# Patient Record
Sex: Male | Born: 1935 | Race: White | Hispanic: No | Marital: Married | Smoking: Former smoker
Health system: Southern US, Community
[De-identification: ages and names within clinical notes are randomized; demographics above are authoritative.]

## PROBLEM LIST (undated history)

## (undated) DIAGNOSIS — G459 Transient cerebral ischemic attack, unspecified: Secondary | ICD-10-CM

## (undated) DIAGNOSIS — E785 Hyperlipidemia, unspecified: Secondary | ICD-10-CM

## (undated) DIAGNOSIS — Z7409 Other reduced mobility: Secondary | ICD-10-CM

## (undated) DIAGNOSIS — I472 Ventricular tachycardia, unspecified: Secondary | ICD-10-CM

## (undated) DIAGNOSIS — T462X5A Adverse effect of other antidysrhythmic drugs, initial encounter: Secondary | ICD-10-CM

## (undated) DIAGNOSIS — Z789 Other specified health status: Secondary | ICD-10-CM

## (undated) DIAGNOSIS — I4821 Permanent atrial fibrillation: Secondary | ICD-10-CM

## (undated) DIAGNOSIS — A4902 Methicillin resistant Staphylococcus aureus infection, unspecified site: Secondary | ICD-10-CM

## (undated) DIAGNOSIS — S065X9A Traumatic subdural hemorrhage with loss of consciousness of unspecified duration, initial encounter: Secondary | ICD-10-CM

## (undated) DIAGNOSIS — N319 Neuromuscular dysfunction of bladder, unspecified: Secondary | ICD-10-CM

## (undated) DIAGNOSIS — J189 Pneumonia, unspecified organism: Secondary | ICD-10-CM

## (undated) DIAGNOSIS — J984 Other disorders of lung: Secondary | ICD-10-CM

## (undated) DIAGNOSIS — I259 Chronic ischemic heart disease, unspecified: Secondary | ICD-10-CM

## (undated) DIAGNOSIS — I509 Heart failure, unspecified: Secondary | ICD-10-CM

## (undated) DIAGNOSIS — R7303 Prediabetes: Secondary | ICD-10-CM

## (undated) DIAGNOSIS — R339 Retention of urine, unspecified: Secondary | ICD-10-CM

## (undated) DIAGNOSIS — D649 Anemia, unspecified: Secondary | ICD-10-CM

## (undated) DIAGNOSIS — J449 Chronic obstructive pulmonary disease, unspecified: Secondary | ICD-10-CM

## (undated) DIAGNOSIS — I739 Peripheral vascular disease, unspecified: Secondary | ICD-10-CM

## (undated) DIAGNOSIS — I2510 Atherosclerotic heart disease of native coronary artery without angina pectoris: Secondary | ICD-10-CM

## (undated) DIAGNOSIS — I10 Essential (primary) hypertension: Secondary | ICD-10-CM

## (undated) DIAGNOSIS — K592 Neurogenic bowel, not elsewhere classified: Secondary | ICD-10-CM

## (undated) DIAGNOSIS — C189 Malignant neoplasm of colon, unspecified: Secondary | ICD-10-CM

## (undated) HISTORY — PX: CHOLECYSTECTOMY: SHX55

## (undated) HISTORY — PX: LUMBAR DISC SURGERY: SHX700

## (undated) HISTORY — PX: ILIAC ARTERY STENT: SHX1786

## (undated) HISTORY — DX: Heart failure, unspecified: I50.9

## (undated) HISTORY — DX: Atherosclerotic heart disease of native coronary artery without angina pectoris: I25.10

## (undated) HISTORY — DX: Hyperlipidemia, unspecified: E78.5

## (undated) HISTORY — DX: Ventricular tachycardia: I47.2

## (undated) HISTORY — DX: Essential (primary) hypertension: I10

## (undated) HISTORY — DX: Chronic ischemic heart disease, unspecified: I25.9

## (undated) HISTORY — PX: COLONOSCOPY: SHX174

## (undated) HISTORY — DX: Chronic obstructive pulmonary disease, unspecified: J44.9

## (undated) HISTORY — PX: POSTERIOR LUMBAR FUSION: SHX6036

## (undated) HISTORY — DX: Ventricular tachycardia, unspecified: I47.20

## (undated) HISTORY — PX: CATARACT EXTRACTION W/ INTRAOCULAR LENS  IMPLANT, BILATERAL: SHX1307

## (undated) HISTORY — DX: Peripheral vascular disease, unspecified: I73.9

## (undated) HISTORY — DX: Malignant neoplasm of colon, unspecified: C18.9

## (undated) HISTORY — PX: POLYPECTOMY: SHX149

---

## 1979-03-04 DIAGNOSIS — G459 Transient cerebral ischemic attack, unspecified: Secondary | ICD-10-CM

## 1979-03-04 HISTORY — DX: Transient cerebral ischemic attack, unspecified: G45.9

## 1996-07-03 HISTORY — PX: CORONARY ARTERY BYPASS GRAFT: SHX141

## 1998-10-12 ENCOUNTER — Other Ambulatory Visit: Admission: RE | Admit: 1998-10-12 | Discharge: 1998-10-12 | Payer: Self-pay | Admitting: Obstetrics and Gynecology

## 2000-01-30 ENCOUNTER — Inpatient Hospital Stay (HOSPITAL_COMMUNITY): Admission: EM | Admit: 2000-01-30 | Discharge: 2000-02-01 | Payer: Self-pay | Admitting: Emergency Medicine

## 2000-01-30 ENCOUNTER — Encounter: Payer: Self-pay | Admitting: Emergency Medicine

## 2000-01-31 ENCOUNTER — Encounter: Payer: Self-pay | Admitting: Interventional Cardiology

## 2004-01-07 ENCOUNTER — Encounter (INDEPENDENT_AMBULATORY_CARE_PROVIDER_SITE_OTHER): Payer: Self-pay | Admitting: *Deleted

## 2004-01-07 ENCOUNTER — Ambulatory Visit (HOSPITAL_COMMUNITY): Admission: RE | Admit: 2004-01-07 | Discharge: 2004-01-07 | Payer: Self-pay | Admitting: Gastroenterology

## 2004-01-28 ENCOUNTER — Encounter: Payer: Self-pay | Admitting: Pulmonary Disease

## 2004-01-28 ENCOUNTER — Ambulatory Visit (HOSPITAL_COMMUNITY): Admission: RE | Admit: 2004-01-28 | Discharge: 2004-01-28 | Payer: Self-pay | Admitting: Interventional Cardiology

## 2004-03-03 HISTORY — PX: COLON SURGERY: SHX602

## 2004-03-08 ENCOUNTER — Inpatient Hospital Stay (HOSPITAL_COMMUNITY): Admission: RE | Admit: 2004-03-08 | Discharge: 2004-03-17 | Payer: Self-pay | Admitting: Surgery

## 2004-03-09 ENCOUNTER — Encounter (INDEPENDENT_AMBULATORY_CARE_PROVIDER_SITE_OTHER): Payer: Self-pay | Admitting: Specialist

## 2004-04-28 ENCOUNTER — Ambulatory Visit (HOSPITAL_COMMUNITY): Admission: RE | Admit: 2004-04-28 | Discharge: 2004-04-28 | Payer: Self-pay | Admitting: Hematology and Oncology

## 2004-06-16 ENCOUNTER — Ambulatory Visit: Payer: Self-pay | Admitting: Pulmonary Disease

## 2004-07-18 ENCOUNTER — Ambulatory Visit: Payer: Self-pay | Admitting: Pulmonary Disease

## 2004-07-20 ENCOUNTER — Ambulatory Visit: Payer: Self-pay | Admitting: Pulmonary Disease

## 2004-07-20 ENCOUNTER — Inpatient Hospital Stay (HOSPITAL_COMMUNITY): Admission: EM | Admit: 2004-07-20 | Discharge: 2004-07-22 | Payer: Self-pay | Admitting: Emergency Medicine

## 2004-07-26 ENCOUNTER — Ambulatory Visit: Payer: Self-pay | Admitting: Hematology and Oncology

## 2004-08-02 ENCOUNTER — Ambulatory Visit: Payer: Self-pay | Admitting: Pulmonary Disease

## 2004-08-22 ENCOUNTER — Ambulatory Visit: Payer: Self-pay | Admitting: Critical Care Medicine

## 2004-09-11 ENCOUNTER — Inpatient Hospital Stay (HOSPITAL_COMMUNITY): Admission: EM | Admit: 2004-09-11 | Discharge: 2004-09-15 | Payer: Self-pay | Admitting: Emergency Medicine

## 2004-10-04 ENCOUNTER — Ambulatory Visit: Payer: Self-pay | Admitting: Pulmonary Disease

## 2004-10-20 ENCOUNTER — Inpatient Hospital Stay (HOSPITAL_COMMUNITY): Admission: AD | Admit: 2004-10-20 | Discharge: 2004-10-20 | Payer: Self-pay | Admitting: *Deleted

## 2004-11-03 ENCOUNTER — Ambulatory Visit: Payer: Self-pay | Admitting: Hematology and Oncology

## 2005-01-02 ENCOUNTER — Ambulatory Visit: Payer: Self-pay | Admitting: Critical Care Medicine

## 2005-01-04 ENCOUNTER — Ambulatory Visit: Payer: Self-pay | Admitting: Critical Care Medicine

## 2005-01-11 ENCOUNTER — Ambulatory Visit: Payer: Self-pay | Admitting: Pulmonary Disease

## 2005-01-13 ENCOUNTER — Ambulatory Visit: Payer: Self-pay | Admitting: Cardiology

## 2005-02-06 ENCOUNTER — Ambulatory Visit: Payer: Self-pay | Admitting: Pulmonary Disease

## 2005-02-06 ENCOUNTER — Inpatient Hospital Stay (HOSPITAL_COMMUNITY): Admission: EM | Admit: 2005-02-06 | Discharge: 2005-02-20 | Payer: Self-pay | Admitting: Emergency Medicine

## 2005-02-28 ENCOUNTER — Ambulatory Visit: Payer: Self-pay | Admitting: Pulmonary Disease

## 2005-03-09 ENCOUNTER — Ambulatory Visit: Payer: Self-pay | Admitting: Pulmonary Disease

## 2005-03-16 ENCOUNTER — Ambulatory Visit: Payer: Self-pay | Admitting: Critical Care Medicine

## 2005-04-11 ENCOUNTER — Ambulatory Visit (HOSPITAL_COMMUNITY): Admission: RE | Admit: 2005-04-11 | Discharge: 2005-04-11 | Payer: Self-pay | Admitting: Gastroenterology

## 2005-04-11 ENCOUNTER — Encounter (INDEPENDENT_AMBULATORY_CARE_PROVIDER_SITE_OTHER): Payer: Self-pay | Admitting: *Deleted

## 2005-04-17 ENCOUNTER — Ambulatory Visit: Payer: Self-pay | Admitting: Pulmonary Disease

## 2005-04-25 ENCOUNTER — Ambulatory Visit: Payer: Self-pay | Admitting: Pulmonary Disease

## 2005-05-07 ENCOUNTER — Inpatient Hospital Stay (HOSPITAL_COMMUNITY): Admission: EM | Admit: 2005-05-07 | Discharge: 2005-05-13 | Payer: Self-pay | Admitting: Emergency Medicine

## 2005-05-07 ENCOUNTER — Ambulatory Visit: Payer: Self-pay | Admitting: Emergency Medicine

## 2005-05-11 ENCOUNTER — Ambulatory Visit: Payer: Self-pay | Admitting: Hematology and Oncology

## 2005-05-17 ENCOUNTER — Ambulatory Visit: Payer: Self-pay | Admitting: Pulmonary Disease

## 2005-05-31 ENCOUNTER — Ambulatory Visit: Payer: Self-pay | Admitting: Pulmonary Disease

## 2005-06-08 ENCOUNTER — Encounter (HOSPITAL_COMMUNITY): Admission: RE | Admit: 2005-06-08 | Discharge: 2005-08-31 | Payer: Self-pay | Admitting: Pulmonary Disease

## 2005-06-20 ENCOUNTER — Ambulatory Visit: Payer: Self-pay | Admitting: Pulmonary Disease

## 2005-06-29 ENCOUNTER — Ambulatory Visit: Payer: Self-pay | Admitting: Pulmonary Disease

## 2005-07-19 ENCOUNTER — Ambulatory Visit: Payer: Self-pay | Admitting: Pulmonary Disease

## 2005-09-01 ENCOUNTER — Encounter (HOSPITAL_COMMUNITY): Admission: RE | Admit: 2005-09-01 | Discharge: 2005-11-30 | Payer: Self-pay | Admitting: Pulmonary Disease

## 2005-09-15 ENCOUNTER — Encounter: Admission: RE | Admit: 2005-09-15 | Discharge: 2005-09-15 | Payer: Self-pay | Admitting: Specialist

## 2005-10-18 ENCOUNTER — Ambulatory Visit: Payer: Self-pay | Admitting: Pulmonary Disease

## 2005-11-10 ENCOUNTER — Ambulatory Visit: Payer: Self-pay | Admitting: Hematology and Oncology

## 2005-11-14 LAB — CBC WITH DIFFERENTIAL/PLATELET
Basophils Absolute: 0 10*3/uL (ref 0.0–0.1)
Eosinophils Absolute: 0 10*3/uL (ref 0.0–0.5)
HCT: 38.5 % — ABNORMAL LOW (ref 38.7–49.9)
LYMPH%: 7.2 % — ABNORMAL LOW (ref 14.0–48.0)
MCV: 91.2 fL (ref 81.6–98.0)
MONO%: 7 % (ref 0.0–13.0)
NEUT#: 8.6 10*3/uL — ABNORMAL HIGH (ref 1.5–6.5)
NEUT%: 85.7 % — ABNORMAL HIGH (ref 40.0–75.0)
Platelets: 261 10*3/uL (ref 145–400)
RBC: 4.22 10*6/uL (ref 4.20–5.71)

## 2005-11-14 LAB — COMPREHENSIVE METABOLIC PANEL
BUN: 21 mg/dL (ref 6–23)
CO2: 29 mEq/L (ref 19–32)
Calcium: 8.8 mg/dL (ref 8.4–10.5)
Creatinine, Ser: 0.9 mg/dL (ref 0.4–1.5)
Glucose, Bld: 102 mg/dL — ABNORMAL HIGH (ref 70–99)
Total Bilirubin: 0.5 mg/dL (ref 0.3–1.2)

## 2005-11-14 LAB — CEA: CEA: 1.6 ng/mL (ref 0.0–5.0)

## 2005-12-01 ENCOUNTER — Encounter (HOSPITAL_COMMUNITY): Admission: RE | Admit: 2005-12-01 | Discharge: 2006-03-01 | Payer: Self-pay | Admitting: Pulmonary Disease

## 2005-12-05 ENCOUNTER — Ambulatory Visit: Payer: Self-pay | Admitting: Pulmonary Disease

## 2006-01-01 ENCOUNTER — Ambulatory Visit: Payer: Self-pay | Admitting: Pulmonary Disease

## 2006-03-06 ENCOUNTER — Encounter (HOSPITAL_COMMUNITY): Admission: RE | Admit: 2006-03-06 | Discharge: 2006-06-04 | Payer: Self-pay | Admitting: Pulmonary Disease

## 2006-03-12 ENCOUNTER — Ambulatory Visit: Payer: Self-pay | Admitting: Pulmonary Disease

## 2006-05-07 ENCOUNTER — Ambulatory Visit: Payer: Self-pay | Admitting: Hematology and Oncology

## 2006-05-09 LAB — CBC WITH DIFFERENTIAL/PLATELET
Basophils Absolute: 0 10*3/uL (ref 0.0–0.1)
EOS%: 0.3 % (ref 0.0–7.0)
HCT: 37 % — ABNORMAL LOW (ref 38.7–49.9)
HGB: 12.6 g/dL — ABNORMAL LOW (ref 13.0–17.1)
MCH: 31.7 pg (ref 28.0–33.4)
MCHC: 34.2 g/dL (ref 32.0–35.9)
MCV: 92.9 fL (ref 81.6–98.0)
MONO%: 6.3 % (ref 0.0–13.0)
NEUT%: 83.3 % — ABNORMAL HIGH (ref 40.0–75.0)

## 2006-05-09 LAB — COMPREHENSIVE METABOLIC PANEL
AST: 26 U/L (ref 0–37)
Alkaline Phosphatase: 76 U/L (ref 39–117)
BUN: 14 mg/dL (ref 6–23)
Calcium: 9 mg/dL (ref 8.4–10.5)
Creatinine, Ser: 1.04 mg/dL (ref 0.40–1.50)
Total Bilirubin: 0.5 mg/dL (ref 0.3–1.2)

## 2006-05-11 ENCOUNTER — Ambulatory Visit (HOSPITAL_COMMUNITY): Admission: RE | Admit: 2006-05-11 | Discharge: 2006-05-11 | Payer: Self-pay | Admitting: Hematology and Oncology

## 2006-05-21 ENCOUNTER — Ambulatory Visit: Payer: Self-pay | Admitting: Pulmonary Disease

## 2006-06-05 ENCOUNTER — Encounter (HOSPITAL_COMMUNITY): Admission: RE | Admit: 2006-06-05 | Discharge: 2006-09-03 | Payer: Self-pay | Admitting: Pulmonary Disease

## 2006-07-30 ENCOUNTER — Ambulatory Visit: Payer: Self-pay | Admitting: Pulmonary Disease

## 2006-08-20 ENCOUNTER — Ambulatory Visit: Payer: Self-pay | Admitting: Pulmonary Disease

## 2006-08-24 ENCOUNTER — Ambulatory Visit: Payer: Self-pay | Admitting: Pulmonary Disease

## 2006-08-31 ENCOUNTER — Encounter: Admission: RE | Admit: 2006-08-31 | Discharge: 2006-08-31 | Payer: Self-pay | Admitting: Internal Medicine

## 2006-09-04 ENCOUNTER — Ambulatory Visit: Payer: Self-pay | Admitting: Internal Medicine

## 2006-09-04 ENCOUNTER — Encounter (HOSPITAL_COMMUNITY): Admission: RE | Admit: 2006-09-04 | Discharge: 2006-09-04 | Payer: Self-pay | Admitting: Pulmonary Disease

## 2006-09-14 ENCOUNTER — Ambulatory Visit: Payer: Self-pay | Admitting: Pulmonary Disease

## 2006-10-24 ENCOUNTER — Encounter: Admission: RE | Admit: 2006-10-24 | Discharge: 2006-10-24 | Payer: Self-pay | Admitting: Interventional Cardiology

## 2006-10-29 ENCOUNTER — Ambulatory Visit: Payer: Self-pay | Admitting: Pulmonary Disease

## 2006-11-14 ENCOUNTER — Ambulatory Visit: Payer: Self-pay | Admitting: Hematology and Oncology

## 2006-11-16 LAB — COMPREHENSIVE METABOLIC PANEL
ALT: 27 U/L (ref 0–53)
AST: 23 U/L (ref 0–37)
Creatinine, Ser: 0.88 mg/dL (ref 0.40–1.50)
Total Bilirubin: 0.5 mg/dL (ref 0.3–1.2)

## 2006-11-16 LAB — CBC WITH DIFFERENTIAL/PLATELET
Eosinophils Absolute: 0.1 10*3/uL (ref 0.0–0.5)
HCT: 34.3 % — ABNORMAL LOW (ref 38.7–49.9)
LYMPH%: 10.6 % — ABNORMAL LOW (ref 14.0–48.0)
MONO#: 1.2 10*3/uL — ABNORMAL HIGH (ref 0.1–0.9)
NEUT#: 8 10*3/uL — ABNORMAL HIGH (ref 1.5–6.5)
NEUT%: 77.1 % — ABNORMAL HIGH (ref 40.0–75.0)
Platelets: 247 10*3/uL (ref 145–400)
WBC: 10.4 10*3/uL — ABNORMAL HIGH (ref 4.0–10.0)
lymph#: 1.1 10*3/uL (ref 0.9–3.3)

## 2006-12-04 ENCOUNTER — Ambulatory Visit: Payer: Self-pay | Admitting: Pulmonary Disease

## 2006-12-17 ENCOUNTER — Ambulatory Visit: Payer: Self-pay | Admitting: Pulmonary Disease

## 2006-12-17 ENCOUNTER — Inpatient Hospital Stay (HOSPITAL_COMMUNITY): Admission: AD | Admit: 2006-12-17 | Discharge: 2006-12-22 | Payer: Self-pay | Admitting: Neurosurgery

## 2006-12-21 ENCOUNTER — Ambulatory Visit: Payer: Self-pay | Admitting: Physical Medicine & Rehabilitation

## 2006-12-22 ENCOUNTER — Inpatient Hospital Stay (HOSPITAL_COMMUNITY)
Admission: RE | Admit: 2006-12-22 | Discharge: 2006-12-27 | Payer: Self-pay | Admitting: Physical Medicine & Rehabilitation

## 2007-01-30 ENCOUNTER — Ambulatory Visit: Payer: Self-pay | Admitting: Pulmonary Disease

## 2007-04-16 DIAGNOSIS — E785 Hyperlipidemia, unspecified: Secondary | ICD-10-CM

## 2007-04-16 DIAGNOSIS — I2510 Atherosclerotic heart disease of native coronary artery without angina pectoris: Secondary | ICD-10-CM

## 2007-04-16 DIAGNOSIS — J439 Emphysema, unspecified: Secondary | ICD-10-CM

## 2007-04-16 DIAGNOSIS — I255 Ischemic cardiomyopathy: Secondary | ICD-10-CM

## 2007-04-16 DIAGNOSIS — J438 Other emphysema: Secondary | ICD-10-CM

## 2007-04-22 ENCOUNTER — Encounter: Admission: RE | Admit: 2007-04-22 | Discharge: 2007-07-21 | Payer: Self-pay | Admitting: Neurosurgery

## 2007-05-02 ENCOUNTER — Ambulatory Visit: Payer: Self-pay | Admitting: Pulmonary Disease

## 2007-05-15 ENCOUNTER — Ambulatory Visit: Payer: Self-pay | Admitting: Hematology and Oncology

## 2007-05-17 LAB — COMPREHENSIVE METABOLIC PANEL
Albumin: 3.6 g/dL (ref 3.5–5.2)
Alkaline Phosphatase: 115 U/L (ref 39–117)
BUN: 16 mg/dL (ref 6–23)
Glucose, Bld: 124 mg/dL — ABNORMAL HIGH (ref 70–99)
Potassium: 4.3 mEq/L (ref 3.5–5.3)

## 2007-05-17 LAB — CEA: CEA: 1.4 ng/mL (ref 0.0–5.0)

## 2007-05-17 LAB — CBC WITH DIFFERENTIAL/PLATELET
Basophils Absolute: 0 10*3/uL (ref 0.0–0.1)
Eosinophils Absolute: 0.1 10*3/uL (ref 0.0–0.5)
HGB: 10.6 g/dL — ABNORMAL LOW (ref 13.0–17.1)
LYMPH%: 15.4 % (ref 14.0–48.0)
MCV: 80.3 fL — ABNORMAL LOW (ref 81.6–98.0)
MONO%: 8.8 % (ref 0.0–13.0)
NEUT#: 6.9 10*3/uL — ABNORMAL HIGH (ref 1.5–6.5)
Platelets: 255 10*3/uL (ref 145–400)
RDW: 17.5 % — ABNORMAL HIGH (ref 11.2–14.6)

## 2007-05-23 ENCOUNTER — Ambulatory Visit (HOSPITAL_COMMUNITY): Admission: RE | Admit: 2007-05-23 | Discharge: 2007-05-23 | Payer: Self-pay | Admitting: Hematology and Oncology

## 2007-05-23 ENCOUNTER — Encounter (HOSPITAL_COMMUNITY): Admission: RE | Admit: 2007-05-23 | Discharge: 2007-07-02 | Payer: Self-pay | Admitting: Pulmonary Disease

## 2007-07-04 ENCOUNTER — Encounter (HOSPITAL_COMMUNITY): Admission: RE | Admit: 2007-07-04 | Discharge: 2007-09-26 | Payer: Self-pay | Admitting: Pulmonary Disease

## 2007-08-14 ENCOUNTER — Ambulatory Visit (HOSPITAL_COMMUNITY): Admission: RE | Admit: 2007-08-14 | Discharge: 2007-08-14 | Payer: Self-pay | Admitting: Hematology and Oncology

## 2007-08-19 ENCOUNTER — Ambulatory Visit: Payer: Self-pay | Admitting: Hematology and Oncology

## 2007-08-21 LAB — COMPREHENSIVE METABOLIC PANEL
ALT: 21 U/L (ref 0–53)
AST: 18 U/L (ref 0–37)
Calcium: 8.8 mg/dL (ref 8.4–10.5)
Chloride: 102 mEq/L (ref 96–112)
Creatinine, Ser: 1.02 mg/dL (ref 0.40–1.50)
Sodium: 139 mEq/L (ref 135–145)
Total Protein: 7.2 g/dL (ref 6.0–8.3)

## 2007-08-21 LAB — CBC WITH DIFFERENTIAL/PLATELET
BASO%: 0.1 % (ref 0.0–2.0)
EOS%: 0 % (ref 0.0–7.0)
HCT: 31.1 % — ABNORMAL LOW (ref 38.7–49.9)
MCH: 28 pg (ref 28.0–33.4)
MCHC: 34 g/dL (ref 32.0–35.9)
MONO#: 0.5 10*3/uL (ref 0.1–0.9)
NEUT%: 90.3 % — ABNORMAL HIGH (ref 40.0–75.0)
RBC: 3.78 10*6/uL — ABNORMAL LOW (ref 4.20–5.71)
RDW: 16.1 % — ABNORMAL HIGH (ref 11.2–14.6)
WBC: 12.3 10*3/uL — ABNORMAL HIGH (ref 4.0–10.0)
lymph#: 0.6 10*3/uL — ABNORMAL LOW (ref 0.9–3.3)

## 2007-09-26 ENCOUNTER — Ambulatory Visit: Payer: Self-pay | Admitting: Pulmonary Disease

## 2007-10-30 ENCOUNTER — Telehealth: Payer: Self-pay | Admitting: Pulmonary Disease

## 2007-12-25 ENCOUNTER — Telehealth (INDEPENDENT_AMBULATORY_CARE_PROVIDER_SITE_OTHER): Payer: Self-pay | Admitting: *Deleted

## 2008-01-24 ENCOUNTER — Ambulatory Visit: Payer: Self-pay | Admitting: Pulmonary Disease

## 2008-02-16 ENCOUNTER — Ambulatory Visit: Payer: Self-pay | Admitting: Hematology and Oncology

## 2008-02-21 LAB — COMPREHENSIVE METABOLIC PANEL
BUN: 19 mg/dL (ref 6–23)
CO2: 26 mEq/L (ref 19–32)
Calcium: 8.9 mg/dL (ref 8.4–10.5)
Chloride: 99 mEq/L (ref 96–112)
Creatinine, Ser: 1.18 mg/dL (ref 0.40–1.50)

## 2008-02-21 LAB — CBC WITH DIFFERENTIAL/PLATELET
Basophils Absolute: 0.1 10*3/uL (ref 0.0–0.1)
HCT: 33.1 % — ABNORMAL LOW (ref 38.7–49.9)
HGB: 11 g/dL — ABNORMAL LOW (ref 13.0–17.1)
MCH: 27.3 pg — ABNORMAL LOW (ref 28.0–33.4)
MONO#: 0.8 10*3/uL (ref 0.1–0.9)
NEUT%: 66.5 % (ref 40.0–75.0)
lymph#: 1.9 10*3/uL (ref 0.9–3.3)

## 2008-02-21 LAB — CEA: CEA: 1.6 ng/mL (ref 0.0–5.0)

## 2008-02-27 ENCOUNTER — Ambulatory Visit (HOSPITAL_COMMUNITY): Admission: RE | Admit: 2008-02-27 | Discharge: 2008-02-27 | Payer: Self-pay | Admitting: Hematology and Oncology

## 2008-03-24 ENCOUNTER — Ambulatory Visit: Payer: Self-pay | Admitting: Vascular Surgery

## 2008-03-24 ENCOUNTER — Encounter (INDEPENDENT_AMBULATORY_CARE_PROVIDER_SITE_OTHER): Payer: Self-pay | Admitting: Neurosurgery

## 2008-03-24 ENCOUNTER — Ambulatory Visit (HOSPITAL_COMMUNITY): Admission: RE | Admit: 2008-03-24 | Discharge: 2008-03-24 | Payer: Self-pay | Admitting: Neurosurgery

## 2008-04-06 ENCOUNTER — Ambulatory Visit: Payer: Self-pay | Admitting: Surgery

## 2008-04-09 ENCOUNTER — Ambulatory Visit: Payer: Self-pay | Admitting: Internal Medicine

## 2008-04-09 ENCOUNTER — Inpatient Hospital Stay (HOSPITAL_COMMUNITY): Admission: EM | Admit: 2008-04-09 | Discharge: 2008-04-11 | Payer: Self-pay | Admitting: Emergency Medicine

## 2008-04-10 HISTORY — PX: BI-VENTRICULAR IMPLANTABLE CARDIOVERTER DEFIBRILLATOR  (CRT-D): SHX5747

## 2008-04-16 ENCOUNTER — Ambulatory Visit (HOSPITAL_COMMUNITY): Admission: RE | Admit: 2008-04-16 | Discharge: 2008-04-16 | Payer: Self-pay | Admitting: Surgery

## 2008-04-24 ENCOUNTER — Inpatient Hospital Stay (HOSPITAL_COMMUNITY): Admission: EM | Admit: 2008-04-24 | Discharge: 2008-04-27 | Payer: Self-pay | Admitting: Emergency Medicine

## 2008-04-24 ENCOUNTER — Other Ambulatory Visit: Payer: Self-pay | Admitting: Interventional Cardiology

## 2008-05-04 ENCOUNTER — Ambulatory Visit: Payer: Self-pay | Admitting: Surgery

## 2008-06-11 ENCOUNTER — Encounter (HOSPITAL_COMMUNITY): Admission: RE | Admit: 2008-06-11 | Discharge: 2008-07-01 | Payer: Self-pay | Admitting: Interventional Cardiology

## 2008-06-15 ENCOUNTER — Ambulatory Visit: Payer: Self-pay | Admitting: Pulmonary Disease

## 2008-07-03 ENCOUNTER — Encounter (HOSPITAL_COMMUNITY): Admission: RE | Admit: 2008-07-03 | Discharge: 2008-10-14 | Payer: Self-pay | Admitting: Interventional Cardiology

## 2008-07-23 ENCOUNTER — Ambulatory Visit: Payer: Self-pay | Admitting: Pulmonary Disease

## 2008-08-14 ENCOUNTER — Ambulatory Visit: Payer: Self-pay | Admitting: Hematology and Oncology

## 2008-08-18 LAB — COMPREHENSIVE METABOLIC PANEL
AST: 54 U/L — ABNORMAL HIGH (ref 0–37)
BUN: 18 mg/dL (ref 6–23)
Calcium: 8.8 mg/dL (ref 8.4–10.5)
Chloride: 102 mEq/L (ref 96–112)
Creatinine, Ser: 1.15 mg/dL (ref 0.40–1.50)
Total Bilirubin: 0.4 mg/dL (ref 0.3–1.2)

## 2008-08-18 LAB — CBC WITH DIFFERENTIAL/PLATELET
BASO%: 0.2 % (ref 0.0–2.0)
EOS%: 0.2 % (ref 0.0–7.0)
HCT: 32.4 % — ABNORMAL LOW (ref 38.7–49.9)
LYMPH%: 14.7 % (ref 14.0–48.0)
MCH: 26.3 pg — ABNORMAL LOW (ref 28.0–33.4)
MCHC: 32.5 g/dL (ref 32.0–35.9)
MCV: 80.9 fL — ABNORMAL LOW (ref 81.6–98.0)
MONO#: 1 10*3/uL — ABNORMAL HIGH (ref 0.1–0.9)
MONO%: 10.3 % (ref 0.0–13.0)
NEUT%: 74.6 % (ref 40.0–75.0)
Platelets: 229 10*3/uL (ref 145–400)

## 2008-08-20 ENCOUNTER — Ambulatory Visit (HOSPITAL_COMMUNITY): Admission: RE | Admit: 2008-08-20 | Discharge: 2008-08-20 | Payer: Self-pay | Admitting: Hematology and Oncology

## 2008-09-05 ENCOUNTER — Observation Stay (HOSPITAL_COMMUNITY): Admission: EM | Admit: 2008-09-05 | Discharge: 2008-09-06 | Payer: Self-pay | Admitting: Emergency Medicine

## 2008-09-05 ENCOUNTER — Ambulatory Visit: Payer: Self-pay | Admitting: Vascular Surgery

## 2008-09-05 ENCOUNTER — Encounter (INDEPENDENT_AMBULATORY_CARE_PROVIDER_SITE_OTHER): Payer: Self-pay | Admitting: Emergency Medicine

## 2008-09-29 ENCOUNTER — Ambulatory Visit: Payer: Self-pay | Admitting: Cardiovascular Disease

## 2008-09-29 ENCOUNTER — Inpatient Hospital Stay (HOSPITAL_COMMUNITY): Admission: EM | Admit: 2008-09-29 | Discharge: 2008-09-30 | Payer: Self-pay | Admitting: Emergency Medicine

## 2008-10-23 ENCOUNTER — Ambulatory Visit: Payer: Self-pay | Admitting: Vascular Surgery

## 2008-10-31 DIAGNOSIS — E876 Hypokalemia: Secondary | ICD-10-CM

## 2008-10-31 DIAGNOSIS — I10 Essential (primary) hypertension: Secondary | ICD-10-CM | POA: Insufficient documentation

## 2008-10-31 DIAGNOSIS — I472 Ventricular tachycardia, unspecified: Secondary | ICD-10-CM | POA: Insufficient documentation

## 2008-10-31 DIAGNOSIS — Z95810 Presence of automatic (implantable) cardiac defibrillator: Secondary | ICD-10-CM

## 2008-10-31 DIAGNOSIS — C189 Malignant neoplasm of colon, unspecified: Secondary | ICD-10-CM | POA: Insufficient documentation

## 2008-11-03 ENCOUNTER — Ambulatory Visit: Payer: Self-pay | Admitting: Internal Medicine

## 2008-11-04 ENCOUNTER — Encounter: Admission: RE | Admit: 2008-11-04 | Discharge: 2008-11-04 | Payer: Self-pay | Admitting: Internal Medicine

## 2008-11-26 ENCOUNTER — Encounter (INDEPENDENT_AMBULATORY_CARE_PROVIDER_SITE_OTHER): Payer: Self-pay | Admitting: *Deleted

## 2008-12-02 ENCOUNTER — Encounter: Payer: Self-pay | Admitting: Pulmonary Disease

## 2008-12-02 ENCOUNTER — Telehealth (INDEPENDENT_AMBULATORY_CARE_PROVIDER_SITE_OTHER): Payer: Self-pay | Admitting: *Deleted

## 2008-12-11 ENCOUNTER — Ambulatory Visit: Payer: Self-pay | Admitting: Internal Medicine

## 2008-12-15 ENCOUNTER — Ambulatory Visit: Payer: Self-pay | Admitting: Internal Medicine

## 2008-12-15 DIAGNOSIS — I4819 Other persistent atrial fibrillation: Secondary | ICD-10-CM

## 2008-12-21 ENCOUNTER — Telehealth: Payer: Self-pay | Admitting: Internal Medicine

## 2008-12-24 ENCOUNTER — Ambulatory Visit: Payer: Self-pay | Admitting: Internal Medicine

## 2008-12-24 ENCOUNTER — Telehealth: Payer: Self-pay | Admitting: Internal Medicine

## 2008-12-28 ENCOUNTER — Encounter: Payer: Self-pay | Admitting: Internal Medicine

## 2009-01-22 ENCOUNTER — Ambulatory Visit: Payer: Self-pay | Admitting: Pulmonary Disease

## 2009-02-22 ENCOUNTER — Ambulatory Visit: Payer: Self-pay | Admitting: Internal Medicine

## 2009-02-22 ENCOUNTER — Encounter: Payer: Self-pay | Admitting: Cardiovascular Disease

## 2009-02-26 ENCOUNTER — Ambulatory Visit: Payer: Self-pay | Admitting: Hematology and Oncology

## 2009-03-02 LAB — CBC WITH DIFFERENTIAL/PLATELET
BASO%: 0.4 % (ref 0.0–2.0)
LYMPH%: 13.8 % — ABNORMAL LOW (ref 14.0–49.0)
MCHC: 32.8 g/dL (ref 32.0–36.0)
MCV: 82.4 fL (ref 79.3–98.0)
MONO%: 10.7 % (ref 0.0–14.0)
Platelets: 229 10*3/uL (ref 140–400)
RBC: 4.01 10*6/uL — ABNORMAL LOW (ref 4.20–5.82)
RDW: 19.6 % — ABNORMAL HIGH (ref 11.0–14.6)
WBC: 8.1 10*3/uL (ref 4.0–10.3)

## 2009-03-02 LAB — COMPREHENSIVE METABOLIC PANEL
ALT: 18 U/L (ref 0–53)
AST: 22 U/L (ref 0–37)
Albumin: 3.5 g/dL (ref 3.5–5.2)
Alkaline Phosphatase: 95 U/L (ref 39–117)
Potassium: 3.8 mEq/L (ref 3.5–5.3)
Sodium: 137 mEq/L (ref 135–145)
Total Bilirubin: 0.5 mg/dL (ref 0.3–1.2)
Total Protein: 7 g/dL (ref 6.0–8.3)

## 2009-04-05 ENCOUNTER — Ambulatory Visit: Payer: Self-pay | Admitting: Vascular Surgery

## 2009-04-07 ENCOUNTER — Encounter: Payer: Self-pay | Admitting: Internal Medicine

## 2009-04-09 ENCOUNTER — Ambulatory Visit: Payer: Self-pay | Admitting: Internal Medicine

## 2009-07-26 ENCOUNTER — Ambulatory Visit: Payer: Self-pay | Admitting: Pulmonary Disease

## 2009-08-25 ENCOUNTER — Ambulatory Visit: Payer: Self-pay | Admitting: Hematology and Oncology

## 2009-08-30 ENCOUNTER — Ambulatory Visit (HOSPITAL_COMMUNITY): Admission: RE | Admit: 2009-08-30 | Discharge: 2009-08-30 | Payer: Self-pay | Admitting: Hematology and Oncology

## 2009-08-30 LAB — CBC WITH DIFFERENTIAL/PLATELET
BASO%: 0.2 % (ref 0.0–2.0)
Basophils Absolute: 0 10*3/uL (ref 0.0–0.1)
EOS%: 0.6 % (ref 0.0–7.0)
HCT: 37.3 % — ABNORMAL LOW (ref 38.4–49.9)
HGB: 12.6 g/dL — ABNORMAL LOW (ref 13.0–17.1)
MCH: 30.6 pg (ref 27.2–33.4)
MCHC: 33.8 g/dL (ref 32.0–36.0)
MCV: 90.5 fL (ref 79.3–98.0)
MONO%: 3.3 % (ref 0.0–14.0)
NEUT%: 88.1 % — ABNORMAL HIGH (ref 39.0–75.0)
lymph#: 1 10*3/uL (ref 0.9–3.3)

## 2009-08-30 LAB — COMPREHENSIVE METABOLIC PANEL
ALT: 22 U/L (ref 0–53)
AST: 23 U/L (ref 0–37)
Alkaline Phosphatase: 93 U/L (ref 39–117)
BUN: 15 mg/dL (ref 6–23)
Creatinine, Ser: 1.1 mg/dL (ref 0.40–1.50)
Total Bilirubin: 0.8 mg/dL (ref 0.3–1.2)

## 2009-09-14 ENCOUNTER — Encounter: Payer: Self-pay | Admitting: Internal Medicine

## 2009-09-22 ENCOUNTER — Ambulatory Visit: Payer: Self-pay | Admitting: Internal Medicine

## 2009-10-18 ENCOUNTER — Ambulatory Visit: Payer: Self-pay | Admitting: Surgery

## 2009-12-02 ENCOUNTER — Encounter: Payer: Self-pay | Admitting: Pulmonary Disease

## 2009-12-02 ENCOUNTER — Telehealth: Payer: Self-pay | Admitting: Pulmonary Disease

## 2010-01-24 ENCOUNTER — Ambulatory Visit: Payer: Self-pay | Admitting: Pulmonary Disease

## 2010-05-06 ENCOUNTER — Ambulatory Visit: Payer: Self-pay | Admitting: Pulmonary Disease

## 2010-06-06 ENCOUNTER — Telehealth (INDEPENDENT_AMBULATORY_CARE_PROVIDER_SITE_OTHER): Payer: Self-pay | Admitting: *Deleted

## 2010-07-03 HISTORY — PX: CARDIAC CATHETERIZATION: SHX172

## 2010-07-19 ENCOUNTER — Ambulatory Visit
Admission: RE | Admit: 2010-07-19 | Discharge: 2010-07-19 | Payer: Self-pay | Source: Home / Self Care | Attending: Vascular Surgery | Admitting: Vascular Surgery

## 2010-07-19 ENCOUNTER — Ambulatory Visit: Admit: 2010-07-19 | Payer: Self-pay | Admitting: Vascular Surgery

## 2010-07-24 ENCOUNTER — Encounter: Payer: Self-pay | Admitting: Neurosurgery

## 2010-07-25 ENCOUNTER — Encounter: Payer: Self-pay | Admitting: Pulmonary Disease

## 2010-07-25 ENCOUNTER — Ambulatory Visit
Admission: RE | Admit: 2010-07-25 | Discharge: 2010-07-25 | Payer: Self-pay | Source: Home / Self Care | Attending: Pulmonary Disease | Admitting: Pulmonary Disease

## 2010-08-01 ENCOUNTER — Ambulatory Visit
Admission: RE | Admit: 2010-08-01 | Discharge: 2010-08-01 | Payer: Self-pay | Source: Home / Self Care | Attending: Surgery | Admitting: Surgery

## 2010-08-02 NOTE — Assessment & Plan Note (Signed)
Summary: acute sick visit for ?copd exacerbation   Visit Type:  Follow-up Copy to:  [name] Primary Provider/Referring Provider:  Dr.Osborne  CC:  pt c/o increased sob, chest congestion, wheezing, and cough with clear phlem x 3 weeks. pt states he used his prednisone as a taper and pt states that helped but now he is out. [name]  History of Present Illness: the pt comes in today for an acute sick visit.  He has known emphysema and CM, and gives a 3 week h/o increased sob, chest congestion and cough, nonpurulent mucus, and wheezing.  He is on chronic prednisone at 10mg /day, but has taken a higher dose most recently that has helped.  However, he is now out of his prednisone.  He has not had any f/c/s.  He has gained 5 pounds since the last visit, and I am concerned about fluid overload.  Preventive Screening-Counseling & Management  Alcohol-Tobacco     Smoking Status: quit  Current Medications (verified): 1)  Prednisone 10 Mg  Tabs (Prednisone) .... Take By Mouth Once Daily 2)  Furosemide 40 Mg  Tabs (Furosemide) .... Take Two Tab By Mouth in The Morning and One Tab By Mouth in The Evening 3)  Potassium Chloride 20 Meq/120ml  Soln (Potassium Chloride) .... Take One By Mouth Once Daily 4)  Norvasc 5 Mg  Tabs (Amlodipine Besylate) .... Take One Tab By Mouth Once Daily 5)  Digitek 0.125 Mg  Tabs (Digoxin) .... Take One Tab By Mouth Once Daily 6)  Spiriva Handihaler 18 Mcg  Caps (Tiotropium Bromide Monohydrate) .... Take One Cap Once Daily 7)  Coumadin 2 Mg  Tabs (Warfarin Sodium) .... Take One Tab By Mouth Every Evening 8)  Simvastatin 20 Mg  Tabs (Simvastatin) .... Take One Tab By Mouth At Bedtime 9)  Pulmicort 0.5 Mg/21ml  Susp (Budesonide (Inhalation)) .... Inhale Two Times A Day 10)  Albuterol Sulfate (2.5 Mg/73ml) 0.083%  Nebu (Albuterol Sulfate) .... Four Times A Day 11)  Atacand 32 Mg  Tabs (Candesartan Cilexetil) .... Take One By Mouth Once Daily 12)  Nitroquick 0.4 Mg  Subl  (Nitroglycerin) .... One Tab Under Tongue Every 5 Minutes X3 13)  Nitroglycerin 0.2 Mg/hr  Pt24 (Nitroglycerin) .[name].. 1 Patch Once Daily 14)  Carvedilol 12.5 Mg Tabs (Carvedilol) .... Take One Tablet By Mouth Twice A Day 15)  Mucinex 600 Mg Xr12h-Tab (Guaifenesin) .... As Needed 16)  Hydrocodone-Acetaminophen 2.5-500 Mg Tabs (Hydrocodone-Acetaminophen) .... As Needed 17)  Proventil Hfa 108 (90 Base) Mcg/act  Aers (Albuterol Sulfate) .[name].. 1-2 Puffs Every 4-6 Hours As Needed  Allergies (verified): No Known Drug Allergies  Past History:  Past medical, surgical, family and social histories (including risk factors) reviewed, and no changes noted (except as noted below).  Past Medical History: Reviewed history from 10/31/2008 and no changes required. HYPOKALEMIA (ICD-276.8) COLON CANCER (ICD-153.9) PVD (ICD-443.9) HYPERTENSION (ICD-401.9) AUTOMATIC IMPLANTABLE CARDIAC DEFIBRILLATOR SITU (ICD-V45.02) CHF (ICD-428.0) VENTRICULAR TACHYCARDIA (ICD-427.1) DISEASE, ISCHEMIC HEART, CHRONIC NEC (ICD-414.8) Hx of DYSLIPIDEMIA (ICD-272.4) CORONARY ARTERY DISEASE (ICD-414.00) COPD (ICD-496) EMPHYSEMA, SEVERE (ICD-492.8)  Past Surgical History: Reviewed history from 10/31/2008 and no changes required.  Status post lumbar back surgery x2.  Status post cholecystectomy.  Stent left common iliac artery implantation of [employer]  Concerto Bi-V ICD   04/10/2008   [name]. Robert Ridgel, MD   Colonoscopy and polypectomy  Family History: Reviewed history from 10/31/2008 and no changes required.   The patient's father had CHF.   Social History: Reviewed history from 10/31/2008 and  no changes required.  No tobacco, alcohol or drug.    Patient states former smoker. quit 2004. 3 ppd. started age 53  Review of Systems       The patient complains of shortness of breath with activity, shortness of breath at rest, productive cough, hand/feet swelling, and joint stiffness or pain.  The patient denies  non-productive cough, coughing up blood, irregular heartbeats, acid heartburn, indigestion, loss of appetite, weight change, abdominal pain, difficulty swallowing, sore throat, tooth/dental problems, headaches, nasal congestion/difficulty breathing through nose, sneezing, itching, ear ache, anxiety, depression, rash, change in color of mucus, and fever.    Vital Signs:  Patient profile:   75 year old male Height:      69 inches Weight:      194 pounds BMI:     28.75 O2 Sat:      96 % on Room air Temp:     98.1 degrees F oral Pulse rate:   85 / minute BP sitting:   128 / 76  (left arm) Cuff size:   regular  Vitals Entered By: Carver Fila (May 06, 2010 9:14 AM)  O2 Flow:  Room air CC: pt c/o increased sob, chest congestion, wheezing, cough with clear phlem x 3 weeks. pt states he used his prednisone as a taper and pt states that helped but now he is out.  Comments meds and allergies updated Phone number updated Carver Fila  May 06, 2010 9:14 AM    Physical Exam  General:  ow male in nad Nose:  no purulence or discharge Lungs:  decreased bs throughout with basilar crackles no wheezing Heart:  distant, but sounds regular Extremities:  minimal edema noted, no cyanosis Neurologic:  alert and oriented, moves all 4.   Impression & Recommendations:  Problem # 1:  CHRONIC OBSTRUCTIVE PULMONARY DISEASE, ACUTE EXACERBATION (ICD-491.21)  the pt has had worsening sob and airflow limitation that I suspect is related to a copd exacerbation.  However, he also has severe heart disease, and his weight is up 5 pounds since last visit.  He needs to keep up with his weight carefully, and let cardiology know if this is increasing.  Will treat with a prednisone taper for now, but if he does not improve, he needs to see cards.  I do not think he has a pulmonary infection at this time.  Medications Added to Medication List This Visit: 1)  Prednisone 10 Mg Tabs (Prednisone) .... Take 4 each  day for 2 days, then 3 each day for 2 days, then 2 each day for 2 days, then 1 each day for 2 days, then stop  Other Orders: Est. Patient Level IV (81191)  Patient Instructions: 1)  will treat with a short prednisone taper, but if you do not improve, I suspect you are taking on fluid and may need adjustments to your fluid medication 2)  weigh daily and let Dr. Katrinka Blazing know if going up 3)  keep scheduled followup with me, but call if symptoms worsening.  Prescriptions: PREDNISONE 10 MG  TABS (PREDNISONE) take by mouth once daily  #30 x 4   Entered and Authorized by:   Barbaraann Share MD   Signed by:   Barbaraann Share MD on 05/06/2010   Method used:   Print then Give to Patient   RxID:   [id number] PREDNISONE 10 MG  TABS (PREDNISONE) take 4 each day for 2 days, then 3 each day for 2 days, then 2 each  day for 2 days, then 1 each day for 2 days, then stop  #20 x 0   Entered and Authorized by:   Barbaraann Share MD   Signed by:   Barbaraann Share MD on 05/06/2010   Method used:     RxID:   [id number]    Immunization History:  Influenza Immunization History:    Influenza:  historical (04/02/2010)

## 2010-08-02 NOTE — Assessment & Plan Note (Signed)
Summary: rov for emphysema   Copy to:  [name] Primary Provider/Referring Provider:  Dr.Osborne  CC:  Pt is here for a 6 month f/u appt. Pt states breathing is unchanged from last visit.  Pt denied a cough.  Pt denied any new complaints.  Pt is currently on 10mg  of Prednisone daily.Marland Kitchen  History of Present Illness: the pt comes in today for f/u of his severe emphysema.  He is doing very well, and is staying active [place] the rec center.  He is compliant with his meds, and feels his exertional tolerance is at baseline.  He has no cough or congestion currently, and feels that his cough has resolved since being off amiodarone.  He is continuing on chronic prednisone.  Medications Prior to Update: 1)  Prednisone 10 Mg  Tabs (Prednisone) .... Take By Mouth Once Daily 2)  Furosemide 40 Mg  Tabs (Furosemide) .... Take Two Tab By Mouth in The Morning and One Tab By Mouth in The Evening 3)  Potassium Chloride 20 Meq/128ml  Soln (Potassium Chloride) .... Take One By Mouth Once Daily 4)  Norvasc 5 Mg  Tabs (Amlodipine Besylate) .... Take One Tab By Mouth Once Daily 5)  Digitek 0.125 Mg  Tabs (Digoxin) .... Take One Tab By Mouth Once Daily 6)  Spiriva Handihaler 18 Mcg  Caps (Tiotropium Bromide Monohydrate) .... Take One Cap Once Daily 7)  Coumadin 2 Mg  Tabs (Warfarin Sodium) .... Take One Tab By Mouth Every Evening 8)  Simvastatin 20 Mg  Tabs (Simvastatin) .... Take One Tab By Mouth At Bedtime 9)  Pulmicort 0.5 Mg/73ml  Susp (Budesonide (Inhalation)) .... Inhale Two Times A Day 10)  Albuterol Sulfate (2.5 Mg/5ml) 0.083%  Nebu (Albuterol Sulfate) .... Four Times A Day 11)  Atacand 32 Mg  Tabs (Candesartan Cilexetil) .... Take One By Mouth Once Daily 12)  Nitroquick 0.4 Mg  Subl (Nitroglycerin) .... One Tab Under Tongue Every 5 Minutes X3 13)  Nitroglycerin 0.2 Mg/hr  Pt24 (Nitroglycerin) .Marland Kitchen.. 1 Patch Once Daily 14)  Carvedilol 12.5 Mg Tabs (Carvedilol) .... Take One Tablet By Mouth Twice A Day 15)  Mucinex  600 Mg Xr12h-Tab (Guaifenesin) .... As Needed 16)  Hydrocodone-Acetaminophen 2.5-500 Mg Tabs (Hydrocodone-Acetaminophen) .... As Needed  Allergies (verified): No Known Drug Allergies  Review of Systems      See HPI  Vital Signs:  Patient profile:   75 year old male Height:      69 inches Weight:      184.50 pounds BMI:     27.34 O2 Sat:      95 % on Room air Temp:     98.1 degrees F oral Pulse rate:   83 / minute BP sitting:   112 / 60  (left arm) Cuff size:   regular  Vitals Entered By: Arman Filter LPN (July 26, 2009 1:30 PM)  O2 Flow:  Room air CC: Pt is here for a 6 month f/u appt. Pt states breathing is unchanged from last visit.  Pt denied a cough.  Pt denied any new complaints.  Pt is currently on 10mg  of Prednisone daily. Comments Medications reviewed with patient Arman Filter LPN  July 26, 2009 1:30 PM    Physical Exam  General:  wd male in nad Lungs:  decreased bs bilat, but no wheezing or rhonchi Heart:  distant, but sounds regular Extremities:  no edema, no cyanosis Neurologic:  alert and oriented, moves all 4.   Impression & Recommendations:  Problem #  1:  EMPHYSEMA, SEVERE (ICD-492.8)  the pt is doing better than expected considering the extensive nature of his cardiopulmonary disease.  He is having no acute bronchospasm, and denies any recent infection.  He is staying active, and has been compliant with his meds.  He will f/u with me in 6mos.  Other Orders: Est. Patient Level II (77824)  Patient Instructions: 1)  no change in meds. 2)  stay as active as possible. 3)  followup with me in 6mos.    Immunization History:  Influenza Immunization History:    Influenza:  historical (04/02/2009)

## 2010-08-02 NOTE — Medication Information (Signed)
Summary: Potassium[facility]  Potassium[facility]   Imported By: Sherian Rein 08/18/2009 14:11:35  _____________________________________________________________________  External Attachment:    Type:   Image     Comment:   External Document

## 2010-08-02 NOTE — Progress Notes (Signed)
  Phone Note Call from Patient   Summary of Call: pt called to let us know dr Verdis Prime takes care of his pacer checks Initial call taken by: Glynda Jaeger,  June 06, 2010 9:53 AM

## 2010-08-02 NOTE — Assessment & Plan Note (Signed)
Summary: DF2   Referring Provider:  [name] Primary Provider:  Dr.Osborne  CC:  Pt complains of a cough.  History of Present Illness: Mr. Boylen returns today for followup.  I saw him several months ago.  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  His amiodarone was discontinued and his cough and dyspnea have improved.  He has not had any additional symptomatic VT since I last saw him. He has gone back to the gym and is working out regularly without problem.   He denies dietary indiscretion.   Current Medications (verified): 1)  Prednisone 10 Mg  Tabs (Prednisone) .... Take By Mouth Once Daily 2)  Furosemide 40 Mg  Tabs (Furosemide) .... Take Two Tab By Mouth in The Morning and One Tab By Mouth in The Evening 3)  Potassium Chloride 20 Meq/127ml  Soln (Potassium Chloride) .... Take One By Mouth Once Daily 4)  Norvasc 5 Mg  Tabs (Amlodipine Besylate) .... Take One Tab By Mouth Once Daily 5)  Digitek 0.125 Mg  Tabs (Digoxin) .... Take One Tab By Mouth Once Daily 6)  Spiriva Handihaler 18 Mcg  Caps (Tiotropium Bromide Monohydrate) .... Take One Cap Once Daily 7)  Coumadin 2 Mg  Tabs (Warfarin Sodium) .... Take One Tab By Mouth Every Evening 8)  Simvastatin 20 Mg  Tabs (Simvastatin) .... Take One Tab By Mouth At Bedtime 9)  Pulmicort 0.5 Mg/76ml  Susp (Budesonide (Inhalation)) .... Inhale Two Times A Day 10)  Albuterol Sulfate (2.5 Mg/39ml) 0.083%  Nebu (Albuterol Sulfate) .... Four Times A Day 11)  Atacand 32 Mg  Tabs (Candesartan Cilexetil) .... Take One By Mouth Once Daily 12)  Nitroquick 0.4 Mg  Subl (Nitroglycerin) .... One Tab Under Tongue Every 5 Minutes X3 13)  Nitroglycerin 0.2 Mg/hr  Pt24 (Nitroglycerin) .Marland Kitchen.. 1 Patch Once Daily 14)  Carvedilol 12.5 Mg Tabs (Carvedilol) .... Take One Tablet By Mouth Twice A Day 15)  Mucinex 600 Mg Xr12h-Tab (Guaifenesin) .... As  Needed 16)  Hydrocodone-Acetaminophen 2.5-500 Mg Tabs (Hydrocodone-Acetaminophen) .... As Needed  Allergies: No Known Drug Allergies  Past History:  Past Medical History: Last updated: 10/31/2008 HYPOKALEMIA (ICD-276.8) COLON CANCER (ICD-153.9) PVD (ICD-443.9) HYPERTENSION (ICD-401.9) AUTOMATIC IMPLANTABLE CARDIAC DEFIBRILLATOR SITU (ICD-V45.02) CHF (ICD-428.0) VENTRICULAR TACHYCARDIA (ICD-427.1) DISEASE, ISCHEMIC HEART, CHRONIC NEC (ICD-414.8) Hx of DYSLIPIDEMIA (ICD-272.4) CORONARY ARTERY DISEASE (ICD-414.00) COPD (ICD-496) EMPHYSEMA, SEVERE (ICD-492.8)  Past Surgical History: Last updated: 10/31/2008  Status post lumbar back surgery x2.  Status post cholecystectomy.  Stent left common iliac artery implantation of [employer]  Concerto Bi-V ICD   04/10/2008   [name]. [name], MD   Colonoscopy and polypectomy  Review of Systems  The patient denies chest pain, syncope, dyspnea on exertion, and peripheral edema.    Vital Signs:  Patient profile:   75 year old male Height:      69 inches Weight:      183 pounds BMI:     27.12 Pulse rate:   87 / minute Resp:     14 per minute BP sitting:   111 / 51  (left arm)  Vitals Entered By: Kem Parkinson (September 22, 2009 10:31 AM)  Physical Exam  General:  wd male in nad Head:  normocephalic and atraumatic Eyes:  PERRLA/EOM intact; conjunctiva and lids normal. Mouth:  Teeth, gums and palate normal. Oral mucosa normal. Neck:  JVP 7-8 cm.  Chest Wall:  Well healed ICD incision. Lungs:  decreased bs bilat, but no wheezing or rhonchi Heart:  distant, but sounds regular Abdomen:  Bowel sounds positive; abdomen soft and non-tender without masses, organomegaly, or hernias noted. No hepatosplenomegaly. Msk:  Back normal, normal gait. Muscle strength and tone normal. Pulses:  pulses normal in all 4 extremities Extremities:  no edema, no cyanosis Neurologic:  alert and oriented, moves all 4.    Episodes Coumadin:   Yes  Brady Parameters Mode DDDR     Lower Rate Limit:  60     Upper Rate Limit 115 PAV 130     Sensed AV Delay:  150  Tachy Zones VF:  176     VT:  200     VT1:  140     MD Comments:  Reviewed his device followup from Dr. Michaelle Copas office. Normal device function and no VT.  Impression & Recommendations:  Problem # 1:  AUTOMATIC IMPLANTABLE CARDIAC DEFIBRILLATOR SITU (ICD-V45.02) His device is working normally.  Will recheck in several months.  Problem # 2:  ATRIAL FIBRILLATION (ICD-427.31) He has evidence of this on his interogation.  Continue current meds as he is minimally symptomatic. His updated medication list for this problem includes:    Digitek 0.125 Mg Tabs (Digoxin) .Marland Kitchen... Take one tab by mouth once daily    Coumadin 2 Mg Tabs (Warfarin sodium) .Marland Kitchen... Take one tab by mouth every evening    Carvedilol 12.5 Mg Tabs (Carvedilol) .Marland Kitchen... Take one tablet by mouth twice a day  Problem # 3:  VENTRICULAR TACHYCARDIA (ICD-427.1) His VT is quiet off of amiodarone.  Will followup. His updated medication list for this problem includes:    Norvasc 5 Mg Tabs (Amlodipine besylate) .Marland Kitchen... Take one tab by mouth once daily    Coumadin 2 Mg Tabs (Warfarin sodium) .Marland Kitchen... Take one tab by mouth every evening    Nitroquick 0.4 Mg Subl (Nitroglycerin) ..... One tab under tongue every 5 minutes x3    Nitroglycerin 0.2 Mg/hr Pt24 (Nitroglycerin) .Marland Kitchen... 1 patch once daily    Carvedilol 12.5 Mg Tabs (Carvedilol) .Marland Kitchen... 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  Will not restart amiodarone. His updated medication list for this problem includes:    Spiriva Handihaler 18 Mcg Caps (Tiotropium bromide monohydrate) .Marland Kitchen... Take one cap once daily    Pulmicort 0.5 Mg/2ml Susp (Budesonide (inhalation)) ..... Inhale two times a day    Albuterol Sulfate (2.5 Mg/76ml) 0.083% Nebu (Albuterol sulfate) .Marland Kitchen... Four times a day  Patient  Instructions: 1)  Your physician wants you to follow-up in:  12 months with Dr [name].  You will receive a reminder letter in the mail two months in advance. If you don't receive a letter, please call our office to schedule the follow-up appointment.

## 2010-08-02 NOTE — Medication Information (Signed)
Summary: Furosemide[facility]  Furosemide[facility]   Imported By: Sherian Rein 12/07/2009 10:38:03  _____________________________________________________________________  External Attachment:    Type:   Image     Comment:   External Document

## 2010-08-02 NOTE — Progress Notes (Signed)
Summary: refill  Phone Note Call from Patient   Caller: Patient Call For: clance Summary of Call: need prescript refilled gave to triage Initial call taken by: Rickard Patience,  December 02, 2009 1:46 PM  Follow-up for Phone Call        Pt is requesting refill for furosemide, I can not tell where we have filled this for him before.  He also typed up a note regarding stem cells to repair lung damage.  I will put in your lookat, thanks Vernie Murders  December 02, 2009 2:08 PM   Additional Follow-up for Phone Call Additional follow up Details #1::        done and gave to [facility] Additional Follow-up by: Barbaraann Share MD,  December 02, 2009 4:53 PM

## 2010-08-02 NOTE — Medication Information (Signed)
Summary: Proventil[facility]  Proventil[facility]   Imported By: Sherian Rein 01/26/2010 11:08:50  _____________________________________________________________________  External Attachment:    Type:   Image     Comment:   External Document

## 2010-08-02 NOTE — Assessment & Plan Note (Signed)
Summary: rov for emphysema   Copy to:  [name] Primary Provider/Referring Provider:  Dr.Osborne  CC:  Pt is here for a 6 month f/u appt.  Pt states breathing has worsened over the last several days but believes this is d/t the heat/humidity.  pt also c/o chest congestion and coughing up clear sputum. Pt is currently on 10mg  of Prednisone.  Marland Kitchen  History of Present Illness: The pt comes in today for f/u of his known emphysema.  He is doing well overall, but has noted a little more chest congestion of late.  He has a mild cough, but no purulent mucus currently.  He thinks the heat and humidity may be contributing to his symptoms.  He is maintaining on his prednisone at 10mg /day.  He has not had any worsening LE edema.  Current Medications (verified): 1)  Prednisone 10 Mg  Tabs (Prednisone) .... Take By Mouth Once Daily 2)  Furosemide 40 Mg  Tabs (Furosemide) .... Take Two Tab By Mouth in The Morning and One Tab By Mouth in The Evening 3)  Potassium Chloride 20 Meq/122ml  Soln (Potassium Chloride) .... Take One By Mouth Once Daily 4)  Norvasc 5 Mg  Tabs (Amlodipine Besylate) .... Take One Tab By Mouth Once Daily 5)  Digitek 0.125 Mg  Tabs (Digoxin) .... Take One Tab By Mouth Once Daily 6)  Spiriva Handihaler 18 Mcg  Caps (Tiotropium Bromide Monohydrate) .... Take One Cap Once Daily 7)  Coumadin 2 Mg  Tabs (Warfarin Sodium) .... Take One Tab By Mouth Every Evening 8)  Simvastatin 20 Mg  Tabs (Simvastatin) .... Take One Tab By Mouth At Bedtime 9)  Pulmicort 0.5 Mg/39ml  Susp (Budesonide (Inhalation)) .... Inhale Two Times A Day 10)  Albuterol Sulfate (2.5 Mg/18ml) 0.083%  Nebu (Albuterol Sulfate) .... Four Times A Day 11)  Atacand 32 Mg  Tabs (Candesartan Cilexetil) .... Take One By Mouth Once Daily 12)  Nitroquick 0.4 Mg  Subl (Nitroglycerin) .... One Tab Under Tongue Every 5 Minutes X3 13)  Nitroglycerin 0.2 Mg/hr  Pt24 (Nitroglycerin) .Marland Kitchen.. 1 Patch Once Daily 14)  Carvedilol 12.5 Mg Tabs  (Carvedilol) .... Take One Tablet By Mouth Twice A Day 15)  Mucinex 600 Mg Xr12h-Tab (Guaifenesin) .... As Needed 16)  Hydrocodone-Acetaminophen 2.5-500 Mg Tabs (Hydrocodone-Acetaminophen) .... As Needed 17)  Proventil Hfa 108 (90 Base) Mcg/act  Aers (Albuterol Sulfate) .Marland Kitchen.. 1-2 Puffs Every 4-6 Hours As Needed  Allergies (verified): No Known Drug Allergies  Review of Systems       The patient complains of shortness of breath with activity and productive cough.  The patient denies shortness of breath at rest, non-productive cough, coughing up blood, chest pain, irregular heartbeats, acid heartburn, indigestion, loss of appetite, weight change, abdominal pain, difficulty swallowing, sore throat, tooth/dental problems, headaches, nasal congestion/difficulty breathing through nose, sneezing, itching, ear ache, anxiety, depression, hand/feet swelling, joint stiffness or pain, rash, change in color of mucus, and fever.    Vital Signs:  Patient profile:   75 year old male Height:      69 inches Weight:      189.13 pounds BMI:     28.03 O2 Sat:      93 % on Room air Temp:     98.3 degrees F oral Pulse rate:   80 / minute BP sitting:   126 / 62  (left arm) Cuff size:   regular  Vitals Entered By: Arman Filter LPN (January 24, 2010 1:38 PM)  O2 Flow:  Room air CC: Pt is here for a 6 month f/u appt.  Pt states breathing has worsened over the last several days but believes this is d/t the heat/humidity.  pt also c/o chest congestion and coughing up clear sputum. Pt is currently on 10mg  of Prednisone.   Comments Medications reviewed with patient Arman Filter LPN  January 24, 2010 1:42 PM    Physical Exam  General:  wd male in nad Nose:  no purulence or drainage Lungs:  decreased bs throughout, no wheezing or rhonchi Heart:  rrr Extremities:  minimal to no edema, no cyanosis Neurologic:  alert and oriented, moves all 4.   Impression & Recommendations:  Problem # 1:  EMPHYSEMA, SEVERE  (ICD-492.8)  the pt has been doing very well since last visit.  He does have a little more congestion and cough the last few days, and is unsure if he is getting a chest infection vs. the heat/humidity.  I have asked him to stay on his same maintenance meds, but will give him a prescription for omnicef to hold in the event he worsens.  He is to continue to stay as active as possible.  Medications Added to Medication List This Visit: 1)  Proventil Hfa 108 (90 Base) Mcg/act Aers (Albuterol sulfate) .Marland Kitchen.. 1-2 puffs every 4-6 hours as needed 2)  Cefdinir 300 Mg Caps (Cefdinir) .... 2 by mouth each am  Other Orders: Est. Patient Level III (56213)  Patient Instructions: 1)  no change in maintenance meds 2)  will give you a prescription for an antibiotic to hold...start taking if increased congestion and mucus, or if turns color. 3)  followup with me in 6mos.  Prescriptions: PROVENTIL HFA 108 (90 BASE) MCG/ACT  AERS (ALBUTEROL SULFATE) 1-2 puffs every 4-6 hours as needed  #3 x 3   Entered by:   Arman Filter LPN   Authorized by:   Barbaraann Share MD   Signed by:   Arman Filter LPN on 08/65/7846   Method used:   Historical   RxID:   [id number] CEFDINIR 300 MG CAPS (CEFDINIR) 2 by mouth each am  #14 x 0   Entered and Authorized by:   Barbaraann Share MD   Signed by:   Barbaraann Share MD on 01/24/2010   Method used:   Print then Give to Patient   RxID:   [id number]

## 2010-08-02 NOTE — Assessment & Plan Note (Signed)
OFFICE VISIT  [name], [name] DOB:  April 09, 1936                                       08/01/2010 [id number]#:[id number]  This patient comes back in today for follow-up of his claudication.  He had stenting of his left common iliac artery on 06/16/2008 for claudication.  There is an 8 mm Genesis stent in his left common iliac artery from the left side.  He now complains of his legs giving out at approximately 100 feet; the right leg was equal to the left.  He denies rest pain or ulceration.  PHYSICAL EXAMINATION:  Heart rate is 78, blood pressure 146/61, O2 saturation is 91%.  General:  Well-appearing in no distress. Respirations nonlabored.  Abdomen:  Soft but obese.  No pulsatile mass. Cardiovascular:  He has a palpable left femoral pulse, a faint right femoral pulse.  No pedal pulses are palpable.  No ulceration.  DIAGNOSTIC STUDIES:  Vascular lab studies were performed and reviewed. There is a stenosis within the external iliac artery on the left of 291. There is stenosis which is in the proximal left iliac artery.  The left iliac stent is patent but we are unable to rule out stenosis due to bowel gas. ABIs have significantly decreased bilaterally.  ABI is 0.51 on the left which is down from 0.67 and is  0.45 on the right which is down from 0.74.  ASSESSMENT/PLAN:  Bilateral claudication right equals left.  PLAN:  The patient has been scheduled for an arteriogram performed Tuesday, February 7.  I will plan on initially accessing his left side, studying both legs and fixing his inflow.  If there is a straightforward outflow lesion I would address that at that time.  This would of course have to be on the right leg since we are accessing her left side.    Jorge Ny, MD Electronically Signed  VWB/MEDQ  D:  08/01/2010  T:  08/02/2010  Job:  [id number]  cc:   Lyn Records, M.D. Hilda Lias, M.D. Theressa Millard, M.D.

## 2010-08-02 NOTE — Letter (Signed)
[facility] Note  [facility] Cardiology Office Note   Imported By: Roderic Ovens 10/25/2009 11:31:17  _____________________________________________________________________  External Attachment:    Type:   Image     Comment:   External Document

## 2010-08-10 ENCOUNTER — Ambulatory Visit (HOSPITAL_COMMUNITY)
Admission: RE | Admit: 2010-08-10 | Discharge: 2010-08-10 | Disposition: A | Discharge status: Home / Self Care | Payer: Medicare Other | Attending: Surgery | Admitting: Surgery

## 2010-08-10 DIAGNOSIS — Z01810 Encounter for preprocedural cardiovascular examination: Secondary | ICD-10-CM | POA: Insufficient documentation

## 2010-08-10 DIAGNOSIS — Z01812 Encounter for preprocedural laboratory examination: Secondary | ICD-10-CM | POA: Insufficient documentation

## 2010-08-10 DIAGNOSIS — I70219 Atherosclerosis of native arteries of extremities with intermittent claudication, unspecified extremity: Secondary | ICD-10-CM | POA: Insufficient documentation

## 2010-08-10 LAB — POCT I-STAT, CHEM 8
BUN: 15 mg/dL (ref 6–23)
Calcium, Ion: 1.1 mmol/L — ABNORMAL LOW (ref 1.12–1.32)
Chloride: 98 mEq/L (ref 96–112)
Glucose, Bld: 132 mg/dL — ABNORMAL HIGH (ref 70–99)
HCT: 35 % — ABNORMAL LOW (ref 39.0–52.0)
Potassium: 4.1 mEq/L (ref 3.5–5.1)

## 2010-08-10 LAB — APTT: aPTT: 28 seconds (ref 24–37)

## 2010-08-10 NOTE — Medication Information (Signed)
Summary: [facility]   Imported By: Lester [place] 08/02/2010 11:27:17  _____________________________________________________________________  External Attachment:    Type:   Image     Comment:   External Document

## 2010-08-10 NOTE — Assessment & Plan Note (Signed)
Summary: rov for emphysema   Copy to:  [name] Primary Provider/Referring Provider:  Dr.Osborne  CC:  6 month followup emphysema.  Pt states that his breathing has improved since last seen.  He is having less chest congestion.  He has occ prod cough with minimal clear to light green sputum. Marland Kitchen  History of Present Illness: the pt come in today for f/u of his known emphysema.  He also has known CM.  He is doing well since his last visit, and feels his congestion and doe have improved.  He has minimal cough at this time, with no significant mucus.  He is unfortunately steroid dependent in order to maintain a functional lifestyle, and is staying on his bronchodilator regimen and oxygen.  Current Medications (verified): 1)  Prednisone 10 Mg  Tabs (Prednisone) .... Take By Mouth Once Daily 2)  Furosemide 40 Mg  Tabs (Furosemide) .... Take Two Tab By Mouth in The Morning and One Tab By Mouth in The Evening 3)  Potassium Chloride 20 Meq/197ml  Soln (Potassium Chloride) .... Take One By Mouth Once Daily 4)  Norvasc 5 Mg  Tabs (Amlodipine Besylate) .... Take One Tab By Mouth Once Daily 5)  Digitek 0.125 Mg  Tabs (Digoxin) .... Take One Tab By Mouth Once Daily 6)  Spiriva Handihaler 18 Mcg  Caps (Tiotropium Bromide Monohydrate) .... Take One Cap Once Daily 7)  Coumadin 2 Mg  Tabs (Warfarin Sodium) .... Take One Tab By Mouth Every Evening 8)  Simvastatin 20 Mg  Tabs (Simvastatin) .... Take One Tab By Mouth At Bedtime 9)  Pulmicort 0.5 Mg/75ml  Susp (Budesonide (Inhalation)) .... Inhale Two Times A Day 10)  Albuterol Sulfate (2.5 Mg/73ml) 0.083%  Nebu (Albuterol Sulfate) .... Four Times A Day 11)  Atacand 32 Mg  Tabs (Candesartan Cilexetil) .... Take One By Mouth Once Daily 12)  Nitroquick 0.4 Mg  Subl (Nitroglycerin) .... One Tab Under Tongue Every 5 Minutes X3 13)  Nitroglycerin 0.2 Mg/hr  Pt24 (Nitroglycerin) .Marland Kitchen.. 1 Patch Once Daily 14)  Carvedilol 12.5 Mg Tabs (Carvedilol) .... Take One Tablet By  Mouth Twice A Day 15)  Mucinex 600 Mg Xr12h-Tab (Guaifenesin) .... As Needed 16)  Hydrocodone-Acetaminophen 2.5-500 Mg Tabs (Hydrocodone-Acetaminophen) .... As Needed 17)  Proventil Hfa 108 (90 Base) Mcg/act  Aers (Albuterol Sulfate) .Marland Kitchen.. 1-2 Puffs Every 4-6 Hours As Needed 18)  Oxygen 2.5 Liters .... At Bedtime and During The Day With Exertion As Needed  Allergies (verified): No Known Drug Allergies  Review of Systems       The patient complains of shortness of breath with activity, shortness of breath at rest, productive cough, hand/feet swelling, and change in color of mucus.  The patient denies non-productive cough, coughing up blood, chest pain, irregular heartbeats, acid heartburn, indigestion, loss of appetite, weight change, abdominal pain, difficulty swallowing, sore throat, tooth/dental problems, headaches, nasal congestion/difficulty breathing through nose, sneezing, itching, ear ache, anxiety, depression, joint stiffness or pain, rash, and fever.    Vital Signs:  Patient profile:   75 year old male Weight:      196 pounds O2 Sat:      91 % on Room air Temp:     98.1 degrees F oral Pulse rate:   79 / minute BP sitting:   112 / 60  (left arm)  Vitals Entered By: Vernie Murders (July 25, 2010 1:33 PM)  O2 Flow:  Room air  Physical Exam  General:  wd male in nad Nose:  no  purulence or drainage noted. Lungs:  decreased bs throughout, no wheezing crackles noted. Heart:  rrr Extremities:  no significant edema or cyanosis  Neurologic:  alert and oriented, moves all 4.   Impression & Recommendations:  Problem # 1:  EMPHYSEMA, SEVERE (ICD-492.8) the pt is doing as well as can be expected given his underlying lung disease and CM.  He is trying to stay active, and has been compliant with his meds.  I will make no changes today, and he is to f/u with me in 6mos  Medications Added to Medication List This Visit: 1)  Oxygen 2.5 Liters  .... At bedtime and during the day with  exertion as needed  Other Orders: Est. Patient Level III (81191)  Patient Instructions: 1)  no change in meds. 2)  stay as active as possible 3)  followup with me in 6mos or sooner if problems.

## 2010-08-11 LAB — POCT ACTIVATED CLOTTING TIME
Activated Clotting Time: 211 seconds
Activated Clotting Time: 234 seconds

## 2010-08-14 NOTE — Op Note (Addendum)
[name], [name]               ACCOUNT NO.:  [id number]  MEDICAL RECORD NO.:  [id number]           PATIENT TYPE:  O  LOCATION:  [facility]                         FACILITY:  [facility]  PHYSICIAN:  Juleen China IV, MDDATE OF BIRTH:  August 16, 1935  DATE OF PROCEDURE:  08/10/2010 DATE OF DISCHARGE:                              OPERATIVE REPORT   PREOPERATIVE DIAGNOSIS:  Bilateral claudication.  POSTOPERATIVE DIAGNOSIS:  Bilateral claudication.  PROCEDURE PERFORMED: 1. Bilateral ultrasound-guided access, bilateral common femoral     artery. 2. Second-order catheterization. 3. Abdominal aortogram. 4. Right leg runoff. 5. Stent, left common iliac artery.  SURGEON: 1. Durene Cal IV, MD  INDICATIONS:  This is a 75 year old gentleman with bilateral claudication and has a history of left common iliac stent which was the 8-mm Genesis stent placed 2 years ago which resolved the symptoms but the symptoms have been progressively getting worse the past several months.  He comes today for an arteriogram.  PROCEDURE IN DETAIL:  The patient was identified in the holding, taken to room 8, and placed supine on the table.  Both groins were prepped and draped in the usual fashion.  Time-out was called.  The left femoral artery was evaluated with ultrasound and found to be patent but calcified.  A digital image was acquired with accessed under ultrasound guidance with a micropuncture needle and 0.018 wire was then advanced without resistance.  Micropuncture sheath was placed.  This was followed by a Bentson wire which met resistance in the proximal external iliac artery.  A 5-French sheath was placed.  I then used the Kumpe catheter to try to get the Bentson wire through the distal common and proximal internal iliac artery, however, I was unable to do so.  A digital image was acquired which showed a stenosis with severe calcification at this area.  I was ultimately able to use a Glidewire and  the Kumpe catheter to get wire access into the aorta.  Wire exchange was performed using a Bentson wire followed by Omni flush catheter.  Abdominal aortogram was then performed and pelvic angiogram followed and multiple obliquities using the Omni flush catheter and Glidewire and 4-French end-hole catheter.  The catheter was advanced into the right external iliac artery and right leg runoff was performed.  FINDINGS:  Aortogram:  The suprarenal abdominal aorta is patent without significant disease.  There is calcium deposits at the origin of the bilateral renal arteries.  The degree of stenosis cannot be fully determined from angiographic images.  Pressure gradients would need to be determined, however, there was likely less than 50% stenosis bilaterally.  The infrarenal abdominal aorta is heavily calcified but patent without significant stenosis.  The left common iliac stent is patent without in-stent stenosis.  There is calcium deposit in the distal left common iliac artery that contributes towards luminal narrowing.  There is also stenosis at the level of the hypogastric artery.  Calcium deposits were also seen within the proximal left external iliac artery.  The right common iliac artery is patent.  There is a calcium deposit in the proximal right external iliac artery that appears  to be hemodynamically significant.  Bilateral hypogastric arteries were patent.  Right lower extremity:  The right common femoral artery is patent but heavily calcified.  The profunda femoral artery is widely patent.  The superficial femoral artery is patent.  However, there are multiple areas of severe calcifications with significant luminal narrowing.  These are diffuse throughout the superficial femoral artery.  The popliteal artery becomes abnormal caliber and quality about the level of patella.  It is patent throughout its course.  The patient has two-vessel runoff via the anterior tibial which  becomes diminutive at the ankle and the peroneal which gives rise to the posterior tibial at the ankle.  The posterior tibial artery is occluded.  At this point in time, the decision was made to intervene over a Rosen wire.  A 7-French sheath was advanced on the left side.  The patient was then fully heparinized.  I elected to place a balloon expandable stent in the distal common iliac artery to land at the origin of the hypogastric artery.  I wanted to make sure to preserve the hypogastric artery.  I selected a Cordis Genesis 8 x 24.  This was taken to a nominal pressure.  A followup study showed resolution of the stenosis. There was no pressure gradient between the aorta and the external iliac artery following this maneuver.  I had previously checked the pressure gradient across the hypogastric artery on the right.  There was a 30-mm gradient.  Therefore, I did not feel that this could be addressed from the contralateral side. Therefore, a right-sided access was performed.  The right femoral artery was evaluated with ultrasound.  It was heavily calcified but patent.  It was accessed with a micropuncture needle.  Mandril wire was then advanced without resistance followed by micropuncture sheath.  The Bentson wire was then placed and then upsized to a 7-French sheath.  Due to the location of the stenosis at the level of the hypogastric artery, I elected to place a balloon expandable stent for better control.  I selected a Cordis Genesis 8 x 18 stent.  It was taken to nominal pressure and deployed at the level of the hypogastric artery.  Followup study revealed resolution of the stenosis.  There was also resolution of the pressure gradient.  At this point in time, I was satisfied with the result.  A decision was made to terminate the procedure.  The catheters and wires were removed.  The patient was taken to the holding area.  I did not shoot the left leg due to contrast that had  previously been administered.  IMPRESSION: 1. Successful stenting of left common iliac stenosis using a Cordis     Genesis 8 x 24 balloon expandable stent. 2. Successful stenting of the proximal right external iliac artery     with a Cordis Genesis 8 x 18 balloon expandable stent. 3. Diffuse right leg superficial femoral disease with a three-vessel     runoff.     Jorge Ny, MD     VWB/MEDQ  D:  08/10/2010  T:  08/11/2010  Job:  [id number]  Electronically Signed by Arelia Longest IV MD on 08/14/2010 10:00:21 PM

## 2010-08-15 LAB — POCT ACTIVATED CLOTTING TIME
Activated Clotting Time: 181 seconds
Activated Clotting Time: 152 seconds

## 2010-09-12 ENCOUNTER — Ambulatory Visit: Payer: Medicare Other | Admitting: Surgery

## 2010-09-12 ENCOUNTER — Ambulatory Visit (INDEPENDENT_AMBULATORY_CARE_PROVIDER_SITE_OTHER): Payer: Medicare Other | Admitting: Surgery

## 2010-09-12 ENCOUNTER — Encounter (INDEPENDENT_AMBULATORY_CARE_PROVIDER_SITE_OTHER): Payer: Medicare Other

## 2010-09-12 DIAGNOSIS — Z48812 Encounter for surgical aftercare following surgery on the circulatory system: Secondary | ICD-10-CM

## 2010-09-12 DIAGNOSIS — I70219 Atherosclerosis of native arteries of extremities with intermittent claudication, unspecified extremity: Secondary | ICD-10-CM

## 2010-09-13 NOTE — Assessment & Plan Note (Addendum)
OFFICE VISIT  [name], [name] DOB:  12/03/1935                                       09/12/2010 [id number]#:[id number]  [name] comes back today for follow-up.  He is status post left common iliac stent and right external iliac stent on August 10, 2010 for lifestyle limiting claudication.  The patient comes back today without complaints.  He does not complain of any claudication or leg problems since his stent procedure.  PHYSICAL EXAMINATION:  His heart rate is 85, blood pressure 127/60, respiratory rate 16. GENERAL:  He is well appearing, in no distress. Respirations nonlabored. EXTREMITIES:  Warm and well-perfused.  Pedal pulses are not palpable. There is no ulceration.  DIAGNOSTICS:  ABIs were checked today.  The right is 0.45, the left is 0.65, previously on the right was 0.45 and the left was 0.51.  ASSESSMENT/PLAN:  Status post bilateral inflow intervention.  The patient has had a nice increase on the left, minimal change on the right which is not surprising given the bulk of his disease was in the superficial femoral artery on the right.  Regardless the patient is asymptomatic and so I would not recommend any further intervention at this time.  He is going to be placed on an ultrasound protocol.  His next study is in 3 months.  At that time he will be followed in the [facility] clinic.  I am planning on seeing in 6 months.    Jorge Ny, MD Electronically Signed  VWB/MEDQ  D:  09/12/2010  T:  09/13/2010  Job:  [id number]  cc:   Hilda Lias, M.D. Lyn Records, M.D.

## 2010-09-16 ENCOUNTER — Encounter (INDEPENDENT_AMBULATORY_CARE_PROVIDER_SITE_OTHER): Payer: Self-pay | Admitting: *Deleted

## 2010-09-20 NOTE — Letter (Signed)
Summary: Appointment - Reminder 2  [facility], Main Office  [address]   [place], [place] [address]   Phone: [phone]  Fax: [phone]     September 16, 2010 MRN: [id number]   [facility] [address] [place], [place]  [address]   Dear Mr. Zylka,  Our records indicate that it is time to schedule a follow-up appointment.  Dr.Taylor recommended that you follow up with [place] in April. It is very important that we reach you to schedule this appointment. We look forward to participating in your health care needs. Please contact us at the number listed above at your earliest convenience to schedule your appointment.  If you are unable to make an appointment at this time, give [place] a call so we can update our records.     Sincerely,   Glass blower/designer

## 2010-10-13 LAB — BASIC METABOLIC PANEL
CO2: 30 mEq/L (ref 19–32)
CO2: 30 mEq/L (ref 19–32)
Calcium: 8.2 mg/dL — ABNORMAL LOW (ref 8.4–10.5)
Chloride: 101 mEq/L (ref 96–112)
Chloride: 101 mEq/L (ref 96–112)
Glucose, Bld: 90 mg/dL (ref 70–99)
Potassium: 4 mEq/L (ref 3.5–5.1)
Sodium: 137 mEq/L (ref 135–145)

## 2010-10-13 LAB — COMPREHENSIVE METABOLIC PANEL
AST: 48 U/L — ABNORMAL HIGH (ref 0–37)
Alkaline Phosphatase: 85 U/L (ref 39–117)
BUN: 15 mg/dL (ref 6–23)
CO2: 26 mEq/L (ref 19–32)
Chloride: 105 mEq/L (ref 96–112)
Creatinine, Ser: 0.97 mg/dL (ref 0.4–1.5)
GFR calc non Af Amer: >60 mL/min (ref >60)
Potassium: 3.6 mEq/L (ref 3.5–5.1)
Total Bilirubin: 0.4 mg/dL (ref 0.3–1.2)

## 2010-10-13 LAB — CBC
HCT: 32.2 % — ABNORMAL LOW (ref 39.0–52.0)
Hemoglobin: 10.8 g/dL — ABNORMAL LOW (ref 13.0–17.0)
MCHC: 33.5 g/dL (ref 30.0–36.0)
MCV: 80.6 fL (ref 78.0–100.0)
RBC: 4 MIL/uL — ABNORMAL LOW (ref 4.22–5.81)

## 2010-10-13 LAB — TROPONIN I: Troponin I: 0.01 ng/mL (ref 0.00–0.06)

## 2010-10-13 LAB — CARDIAC PANEL(CRET KIN+CKTOT+MB+TROPI)
CK, MB: 3 ng/mL (ref 0.3–4.0)
CK, MB: 3.7 ng/mL (ref 0.3–4.0)
CK, MB: 3.7 ng/mL (ref 0.3–4.0)
Total CK: 55 U/L (ref 7–232)
Total CK: 83 U/L (ref 7–232)
Troponin I: 0.01 ng/mL (ref 0.00–0.06)
Troponin I: 0.02 ng/mL (ref 0.00–0.06)

## 2010-10-13 LAB — DIFFERENTIAL
Basophils Relative: 0 % (ref 0–1)
Eosinophils Absolute: 0 10*3/uL (ref 0.0–0.7)
Eosinophils Relative: 0 % (ref 0–5)
Monocytes Absolute: 0.9 10*3/uL (ref 0.1–1.0)
Monocytes Relative: 11 % (ref 3–12)
Neutro Abs: 6.1 10*3/uL (ref 1.7–7.7)

## 2010-10-13 LAB — POCT I-STAT, CHEM 8
BUN: 16 mg/dL (ref 6–23)
Chloride: 103 mEq/L (ref 96–112)
Creatinine, Ser: 1.1 mg/dL (ref 0.4–1.5)
Potassium: 3.4 mEq/L — ABNORMAL LOW (ref 3.5–5.1)
Sodium: 138 mEq/L (ref 135–145)
TCO2: 26 mmol/L (ref 0–100)

## 2010-10-13 LAB — T4, FREE: Free T4: 1.55 ng/dL (ref 0.89–1.80)

## 2010-10-13 LAB — BRAIN NATRIURETIC PEPTIDE: Pro B Natriuretic peptide (BNP): 332 pg/mL — ABNORMAL HIGH (ref 0.0–100.0)

## 2010-10-13 LAB — POCT CARDIAC MARKERS

## 2010-10-13 LAB — TSH: TSH: 0.847 u[IU]/mL (ref 0.350–4.500)

## 2010-10-13 LAB — APTT: aPTT: 50 seconds — ABNORMAL HIGH (ref 24–37)

## 2010-10-13 LAB — PROTIME-INR: INR: 2.3 — ABNORMAL HIGH (ref 0.00–1.49)

## 2010-10-14 ENCOUNTER — Other Ambulatory Visit: Payer: Self-pay | Admitting: Pulmonary Disease

## 2010-10-16 ENCOUNTER — Emergency Department (HOSPITAL_COMMUNITY): Payer: Medicare Other

## 2010-10-16 ENCOUNTER — Emergency Department (HOSPITAL_COMMUNITY)
Admission: EM | Admit: 2010-10-16 | Discharge: 2010-10-17 | Disposition: A | Discharge status: Home / Self Care | Payer: Medicare Other | Attending: Emergency Medicine | Admitting: Emergency Medicine

## 2010-10-16 DIAGNOSIS — R509 Fever, unspecified: Secondary | ICD-10-CM | POA: Insufficient documentation

## 2010-10-16 DIAGNOSIS — J40 Bronchitis, not specified as acute or chronic: Secondary | ICD-10-CM | POA: Insufficient documentation

## 2010-10-16 DIAGNOSIS — R059 Cough, unspecified: Secondary | ICD-10-CM | POA: Insufficient documentation

## 2010-10-16 DIAGNOSIS — J4489 Other specified chronic obstructive pulmonary disease: Secondary | ICD-10-CM | POA: Insufficient documentation

## 2010-10-16 DIAGNOSIS — I2510 Atherosclerotic heart disease of native coronary artery without angina pectoris: Secondary | ICD-10-CM | POA: Insufficient documentation

## 2010-10-16 DIAGNOSIS — I10 Essential (primary) hypertension: Secondary | ICD-10-CM | POA: Insufficient documentation

## 2010-10-16 DIAGNOSIS — R0602 Shortness of breath: Secondary | ICD-10-CM | POA: Insufficient documentation

## 2010-10-16 DIAGNOSIS — J449 Chronic obstructive pulmonary disease, unspecified: Secondary | ICD-10-CM | POA: Insufficient documentation

## 2010-10-16 DIAGNOSIS — I4891 Unspecified atrial fibrillation: Secondary | ICD-10-CM | POA: Insufficient documentation

## 2010-10-16 DIAGNOSIS — R05 Cough: Secondary | ICD-10-CM | POA: Insufficient documentation

## 2010-10-18 ENCOUNTER — Telehealth: Payer: Self-pay | Admitting: Pulmonary Disease

## 2010-10-18 ENCOUNTER — Other Ambulatory Visit: Payer: Self-pay | Admitting: Pulmonary Disease

## 2010-10-18 NOTE — Telephone Encounter (Signed)
PATIENT CALLING BACK BECAUSE HE HAS NOT HEARD FROM EARLIER MESSAGE.

## 2010-10-18 NOTE — Telephone Encounter (Signed)
[facility] [place]. [name]

## 2010-10-18 NOTE — Telephone Encounter (Signed)
Called and spoke with.pt  Pt aware rx for Prednisone already sent to pharmacy earlier today.

## 2010-11-15 ENCOUNTER — Ambulatory Visit (INDEPENDENT_AMBULATORY_CARE_PROVIDER_SITE_OTHER): Payer: Medicare Other | Admitting: Adult Health

## 2010-11-15 ENCOUNTER — Encounter: Payer: Self-pay | Admitting: Adult Health

## 2010-11-15 DIAGNOSIS — J441 Chronic obstructive pulmonary disease with (acute) exacerbation: Secondary | ICD-10-CM

## 2010-11-15 MED ORDER — PREDNISONE 10 MG PO TABS: ORAL_TABLET | ORAL | Status: AC

## 2010-11-15 MED ORDER — AMOXICILLIN-POT CLAVULANATE 875-125 MG PO TABS: 1.0000 | ORAL_TABLET | Freq: Two times a day (BID) | ORAL | Status: AC

## 2010-11-15 NOTE — Op Note (Signed)
NAMEWAINO, MOUNSEY NO.:  [id number]   MEDICAL RECORD NO.:  [id number]          PATIENT TYPE:  INP   LOCATION:  3706                         FACILITY:  [facility]   PHYSICIAN:  [name]. [name], MD    DATE OF BIRTH:  05/30/1936   DATE OF PROCEDURE:  04/10/2008  DATE OF DISCHARGE:                               OPERATIVE REPORT   PROCEDURE PERFORMED:  Removal of a previously implanted defibrillator  and insertion of a new defibrillator lead by way of the left internal  jugular vein with tunneling of the new lead into the left subclavian  infraclavicular pocket with pocket revision and defibrillation threshold  testing along with the contrast venography of the left upper extremity.   INTRODUCTION:  The patient is a 75 year old man with a history of  ischemic cardiomyopathy, congestive heart failure who is status post  initial dual-chamber pacemaker insertion followed by upgrade to a  biventricular ICD in 2006.  The patient had an atrial lead, an RV lead,  and defibrillator lead in the right ventricle as well as the LV pacing  lead.  He underwent insertion of his LV and defibrillator lead in 2006.  He was subsequently found to have a 6949 lead which had developed  fracture of the distal coil resulting in inability to LV pacing  secondary to the LV being a unipolar lead and also failure of the distal  coil.  He is now referred for ICD lead revision.   PROCEDURE:  After informed consent was obtained, the patient was taken  to the Diagnostic EP Lab in a fasting state.  After usual preparation,  he was placed in a supine position and 10 mL of contrast injected into  the left upper extremity venous system demonstrated an occluded left  subclavian vein in its extrathoracic portion.  There were extensive  collaterals through the shoulder and the neck.  At this point, the  patient was draped in a manner where we had some access to the left  internal jugular vein.  Lidocaine 10  mL was infiltrated over the neck  region as well as 20 mL over the old ICD insertion site.  After some  difficulty, the left internal jugular vein was cannulated.  There were  very dense strictures and stenosis in the innominate vein as well as the  superior vena cava.  A Wholey wire was utilized to traverse the stenoses  and a 9-French long sheath was advanced over the [facility] wire and into  the right atrium.  The St. Jude Durata active fixation defibrillation  lead, serial [id number] was advanced into the right ventricle and mapping  carried out on the final site.  The R-waves measured 10, the impedance  was 638 with the lead actively fixed, and the threshold was 1 volt at  0.5 milliseconds.  The 10-volt pacing did not stimulate the diaphragm.  With the ventricular lead in satisfactory position, it was secured with  a silk suture to the subcutaneous tissue just underneath the skin on the  neck.  At this point, an incision was made over the pocket  and  electrocautery was utilized to dissect down to the fascial plane of the  infraclavicular pocket.  The electrocautery was utilized to tunnel the  pocket and a Kelly forceps was utilized to create a tunnel between the  old infraclavicular pocket and the new defibrillator lead in the left  internal jugular vein.  This was carried out with some difficulty,  though it was carried out successfully.  The defibrillator lead remained  in position.  At this point, the electrocautery was utilized to open the  old defibrillator pocket and electrocautery was utilized to free up the  defibrillator and other pacing leads.  The old defibrillator lead was  capped and the new defibrillator lead was connected to the Medtronic  Concerto Bi-V ICD along with the old atrial lead and the LV lead.  When  this all was accomplished, biventricular pacing was demonstrated.  At  this point, the pocket was irrigated with copious amounts of kanamycin.  Electrocautery was  utilized to assure hemostasis and defibrillation  threshold testing carried out.   After the patient was more deeply sedated with fentanyl and Versed, VF  was induced with a T-wave shock.  A 20-joule shock was delivered which  terminated the VF and restored sinus rhythm.  At this point, no  additional defibrillation threshold testing was carried out and the  incision was closed with 2-0 Vicryl followed by a layer of 3-0 Vicryl  both at the old ICD insertion site as well as the new left  infraclavicular ICD lead insertion site.      [name]. [name], MD  Electronically Signed     GWT/MEDQ  D:  04/10/2008  T:  04/11/2008  Job:  [id number]

## 2010-11-15 NOTE — Op Note (Signed)
NAMEHARRIS, [name]               ACCOUNT NO.:  [id number]   MEDICAL RECORD NO.:  [id number]          PATIENT TYPE:  AMB   LOCATION:  SDS                          FACILITY:  [facility]   PHYSICIAN:  [name], M.D.   DATE OF BIRTH:  02/01/1936   DATE OF PROCEDURE:  12/14/2006  DATE OF DISCHARGE:                               OPERATIVE REPORT   PREOPERATIVE DIAGNOSES:  L4-5 spondylolisthesis with stenosis,  degenerative disk disease at L5-S1, columnar radiculopathy status post 2  lumbar surgeries.   POSTOPERATIVE DIAGNOSES:  L4-5 spondylolisthesis with stenosis,  degenerative disk disease at L5-S1, columnar radiculopathy status post 2  lumbar surgeries.   PROCEDURE PERFORMED:  L4, L5 laminectomy, facetectomy, total L4-L5  diskectomy bilaterally, interbody fusion with cages and autograft BMP,  pedicle screws L4, L5, S1 bilaterally, posterolateral arthrodesis 4-5, 5-  1, Cell Saver, C-arm.   SURGEON:  1. [name], M.D.  2. [name], M.D.   CLINICAL HISTORY:  [name] is a 75 year old gentleman who in the past  underwent 2 lumbar procedures.  The patient had been having back pain  raising to both legs associated with claudication.  X-ray shows stenosis  at L4-5, L5-S1.  Surgery was advised.  The risks were explained in  history and physical.  The patient has a chronic pulmonary problem as  well as cardiac disease.   PROCEDURE:  The patient was taken to the OR and after intubation, he was  positioned in prone manner.  The back was cleaned with DuraPrep.  A  midline incision from L3 down to L5 was made.  Muscle was retracted  laterally.  The patient had quite a bit of adhesions and it was  difficult to see the amount secondary to the scar tissue.  Nonetheless,  we proceeded with finding the spinous process of the L4 and L5, both of  them were removed.  Then we went laterally and we removed the lamina as  well as the facets.  We were working away from the lateral to midline  to  avoid scar tissue.  There was an area where patient had previously had  silk down durally, could be secondary to dural tear.  The left  dissection was carried down and slowly we were able to identify the L4-5  disks.  The L5 was quite narrow and it was difficult to introduce any  type of instrument.  At the left, after the facetectomy was achieved  with a foraminotomy to decompress the L4, L5, S1 nerve root.  We entered  the disk space first from the right side and then entered from the left  side.  Using the curet, total ________ was achieved.  Then, two cages of  14 x 22 with autograft and BMP were introduced.  The rest of the disks  were filled up with autograft and BMP.  Another attempt was made to get  into the disk space of 5-1, but we were unable.  After that, with the C-  arm, first in AP view and then in lateral view, we identified the patent  pedicles 4, 5 and  S1.  Pedicle probes were inserted and the needles were  introduced.  Four screws of 5.5 x 40 and two of 5.5 x 45 at the level of  S1.  The pedicle screws were connected with the rotocaps.  Then we went  laterally and the lateral aspect of the facet as well as transverse  process were drilled to remove the periosteum and a mix of autograft and  BMP were used for the arthrodesis.  Before we  started closing, again, we introduced a probe into the foramen to be  sure the patient had good space.  There was good space from the L4, L5  and S1.  Then, Tisseel was left in the pleural space of the wound and  was closed with Vicryl and nylon.           ______________________________  [name], M.D.     EB/MEDQ  D:  12/14/2006  T:  12/14/2006  Job:  [id number]

## 2010-11-15 NOTE — Discharge Summary (Signed)
NAMERICKI, CLACK NO.:  [id number]   MEDICAL RECORD NO.:  [id number]          PATIENT TYPE:  IPS   LOCATION:  4031                         FACILITY:  [facility]   PHYSICIAN:  Ranelle Oyster, M.D.DATE OF BIRTH:  Jul 09, 1935   DATE OF ADMISSION:  12/22/2006  DATE OF DISCHARGE:                               DISCHARGE SUMMARY   DISCHARGE DIAGNOSES:  1. Lumbar stenosis with lumbar laminectomy L4-5, L5-S1 on December 14, 2006.  2. Pain management.  3. Atrial fibrillation.  4. Coronary artery disease with coronary artery bypass grafting.  5. Chronic obstructive pulmonary disease.  6. History of colon cancer.   This is a 75 year old white male, chronic Coumadin for atrial  fibrillation, admitted June 13 with increasing low-back pain.  Noted  history of two prior back surgeries.  X-ray showed L4-5  spondylolisthesis with stenosis as well as lumbar L5-S1 radiculopathy.  Underwent lumbar L4-5 laminectomy, L4-5 diskectomy done by Dr. Jeral Fruit  June 13.  Tolerated the procedure well.  Followup pulmonary services for  history of chronic obstructive pulmonary disease and aggressive  pulmonary toileting.  His chronic Coumadin was resumed and monitored.   PAST MEDICAL HISTORY:  See discharge diagnoses.   ALLERGIES:  None.   MEDICATIONS PRIOR TO ADMISSION:  1. Klor-Con 20 mEq daily.  2. Digoxin 0.125 mg daily.  3. Lasix 40 mg two tablets in the a.m., one in the p.m.  4. Prednisone 10 mg daily.  5. Morphine IR 15 mg every 6 hours as needed.  6. Altace daily.  7. Coumadin daily.  8. Zocor 20 mg daily  9. Nitro-Dur patch daily.  10.Norvasc 5 mg daily.  11.Amiodarone 200 mg daily.  12.Coreg 12.5 mg twice daily.  13.Nebulizer treatments.   SOCIAL HISTORY:  The patient lives with his wife.  Wife works part-time.  No other local family.  Two-level home, bedroom downstairs, four steps  to entry.  The patient was independent prior to admission.   REHABILITATION HOSPITAL  COURSE:  The patient was admitted to inpatient  rehab services with therapies initiated on a 3-hour-daily basis  consisting of physical therapy, occupational therapy and rehabilitation  nursing.  The following issues were addressed during the patient's  rehabilitation stay.  Pertaining to Mr. Papadakis lumbar laminectomy L4-  5, L5-S1 of June 13, surgical site healing nicely.  He was wearing a  back brace when out of bed.  He was supervision for ambulation.  Neurovascular sensation intact.  He was using oxycodone for pain with  good results.  He remained on chronic Coumadin for atrial fibrillation  with cardiac rate controlled.  He would follow up Dr. Verdis Prime.  A  home health nurse had been arranged for the necessary blood draws.  Blood pressure was monitored on Lanoxin, Lasix, Norvasc, amiodarone,  Coreg, Nitro-Dur patch, and Avapro.  He had some initial difficulty in  voiding with advanced post void residuals.  Flomax and Urecholine were  added to his regimen as well as teaching for intermittent  catheterization.  A Foley catheter tube would be inserted if the patient  not able to do intermittent catheterizations.  He would remain on  nebulizer treatments as per Dr. Shelle Iron of pulmonary services.  His  oxygen saturation remained 90% on room air.  Overall his strength and  endurance had greatly improved.  As he was encouraged with his overall  progress, he was discharged to home.   Latest labs showed an INR of 1.3 on June 25, sodium 136, potassium 4.0,  BUN 10, creatinine 0.8, hemoglobin 8.9, hematocrit 26.5.   DISCHARGE MEDICATIONS AT TIME OF DICTATION:  1. Lanoxin 0.125 mg daily.  2. Lasix 40 mg two tablets in the a.m., one in the p.m.  3. Prednisone 10 mg daily.  4. Zocor 20 mg daily.  5. Norvasc 5 mg daily.  6. Amiodarone 200 mg daily.  7. Coreg 12.5 mg twice daily.  8. Nitro-Dur patch 0.2 mg daily.  9. Avapro 300 mg daily.  10.Coumadin daily, dose to be established at  time of discharge.  11.Potassium chloride 20 mEq daily.  12.Trinsicon twice daily.  13.Flomax 0.4 mg two tablets at bedtime.  14.Urecholine 25 mg three times daily.  15.Oxycodone immediate release 5 mg one or two tablets every 4 hours      needed pain, dispense #90 tablets.   He will continue with his home nebulizers as prior to admission.  Diet  was regular.  A home health nurse would be arranged for necessary blood  draws while on chronic Coumadin therapy with results to Dr. Verdis Prime,  [phone], fax number [phone].  He was advised to wear his back brace  when out of bed, as well as no driving.      Mariam Dollar, P.A.      Ranelle Oyster, M.D.  Electronically Signed    DA/MEDQ  D:  12/26/2006  T:  12/26/2006  Job:  [id number]   cc:   Theressa Millard, M.D.  Lyn Records, M.D.  Barbaraann Share, MD,FCCP  Hilda Lias, M.D.

## 2010-11-15 NOTE — Assessment & Plan Note (Signed)
OFFICE VISIT   [name], [name]  DOB:  11/17/1935                                       10/18/2009  EAVWU#:[id number]   The patient returns today for follow-up.  He is status post stenting of  his left common iliac artery on June 16, 2008 for claudication.  He  has not had any trouble since that time.  He denies any symptoms of  claudication, today.  There has been no significant changes in his  medical history since I last saw him.   REVIEW OF SYSTEMS:  Positive for shortness of breath with exertion and  home oxygen.  All other review of systems are negative.   SOCIAL HISTORY:  Continues to be a nonsmoker and does not drink.   PHYSICAL EXAM:  Heart rate 80, blood pressure 157/75, temperature is  98.2.  General:  Well-appearing in no distress.  HEENT:  Within normal  limits.  Lungs:  Respirations are nonlabored.  Abdomen:  Soft,  nontender.  He has palpable femoral pulses bilaterally.  Skin is without  rash.   DIAGNOSTIC STUDIES:  Ultrasound independently reviewed and this shows an  ABI of 0.67 on the left and 0.74 on the right.  No significant change  from previous ultrasounds.   ASSESSMENT/PLAN:  Status post stenting of left common iliac artery.   PLAN:  The patient continues to be asymptomatic.  We discussed the  symptoms to look out for, he is aware of them.  I will plan on  continuing our ultrasound protocol.  I will see him back in 6 months and  he will contact me if he has any problems.     Jorge Ny, MD  Electronically Signed   VWB/MEDQ  D:  10/18/2009  T:  10/19/2009  Job:  [id number]   cc:   Dr. [name]  Dr. Clinton Sawyer, M.D.

## 2010-11-15 NOTE — Patient Instructions (Signed)
Augmentin 875mg Twice daily  For 7 days take w/ food Mucinex DM Twice daily  As needed  Cough/congestion  Fluids and rest  Increase Prednisone 10mg 4 tabs for 4 days, then 3 tabs for 4 days, 2 tabs for 4 days, then back to 1 tab daily  follow up Dr. Clance as planned and As needed   Please contact office for sooner follow up if symptoms do not improve or worsen or seek emergency care   

## 2010-11-15 NOTE — Discharge Summary (Signed)
[name], MAK NO.:  [id number]   MEDICAL RECORD NO.:  [id number]          PATIENT TYPE:  INP   LOCATION:  2909                         FACILITY:  [facility]   PHYSICIAN:  Lyn Records, M.D.   DATE OF BIRTH:  July 06, 1935   DATE OF ADMISSION:  09/29/2008  DATE OF DISCHARGE:  09/30/2008                               DISCHARGE SUMMARY   DISCHARGE DIAGNOSES:  1. Ventricular tachycardia with spontaneous resolution.  2. Congestive heart failure, acute on chronic, clinically improved.  3. Ischemic cardiomyopathy, ejection fraction 5-15% status post      implantable cardioverter-defibrillator implantation in the past.  4. Hypertension.  5. Peripheral vascular disease.  6. History of colon cancer.  7. Status post lumbar back surgery x2.  8. Status post cholecystectomy.   HOSPITAL COURSE:  [name] is a 75 year old male patient with a past  medical history significant for ischemic cardiomyopathy.  His EF has  been noted to be around 15%.  He is status post intraventricular ICD.  He had bypass surgery in the mid 1990s.  He does have a history of  ventricular tachycardia in the past.  He presented with chest pressure  that he states is exactly the same as he experienced when he had his  ventricular tachycardia in the past.  He felt a sudden onset of chest  pressure associated with some shortness of breath and mild dizziness.  The sensation lasted about 45 minutes.  EMS was called and found that  the patient was in ventricular tachycardia.  He then was converted to  sinus rhythm versus a paced rhythm without cardioversion being  necessary.   His AICD did not discharge.  The device was interrogated, and it was  found that his 2 episodes of ventricular tachycardia, were rated at 147-  150 beats per minute.  The VT zone was set at 154 beats per minute;  therefore, no therapy was delivered and the episode was self-terminated  over time.  We had electrophysiologist,  Dr. Hillis Range, see the  patient and adjustments were made to the system.  The VT zone was  adjusted to 140 beats per minute with NID increased to 32 beats.  The V-  fib zone increased to 200 beats per minute.  A fast VT zone created from  176-200 beats per minute with ATP and shock therapy program on and the  rate response was turned on.   Other adjustments for his medications, we increased his Coreg to 25 mg  p.o. b.i.d.  An amiodarone level and thyroid function tests were ordered  and of course at this point have not come back, they will take a couple  of days to come back, but we will need to check this at his office  visit, but for now, we will continue his amiodarone twice a day until  his followup.   LABORATORY STUDIES DURING THIS HOSPITALIZATION:  BNP 332.  Cardiac  enzymes negative.  Sodium 137, potassium 4.0, BUN 10, creatinine 0.87,  magnesium 2.3.  PT 26.6, INR 2.3.   Chest x-ray shows some pulmonary vascular congestion.  He was diuresed  during this hospitalization.   DISCHARGE MEDICATIONS:  1. Coreg 25 mg p.o. b.i.d.  2. Amiodarone 200 mg twice a day.  3. Lasix 80 mg in the morning, 40 mg in the evening.  4. Atacand 32 mg a day.  5. Lanoxin 0.125 mg once a day.  6. Baby aspirin 81 mg a day,  7. Potassium 20 mEq a day.  8. Zocor nightly as prior to admission.  9. Norvasc 5 mg a day.  10.Prednisone 10 mg a day.  11.Coumadin 5 mg one-half tablet daily.  12.Proventil inhaler as before.  13.Home oxygen as needed.  14.Nitro-Dur patch 0.2 mg per hour, applied daily as before.  15.Spiriva daily as before.   The patient is to remain on a low-sodium heart-healthy diet, increase  activity slowly, follow up for Coumadin check on October 06, 2008, at 1:30  p.m., then follow up with Dr. Katrinka Blazing on October 15, 2008, at 2:00 p.m.  He  is to follow up with Dr. [name] at [facility] on Nov 03, 2008, at 2:30 p.m.      [name], P.A.      Lyn Records, M.D.   Electronically Signed    LB/MEDQ  D:  09/30/2008  T:  10/01/2008  Job:  [id number]

## 2010-11-15 NOTE — Procedures (Signed)
AORTA-ILIAC DUPLEX EVALUATION   INDICATION:  Follow up common iliac artery stent.  Patient states no  claudication.   HISTORY:  Diabetes:  No.  Cardiac:  CHF.  Hypertension:  Yes.  Smoking:  Previous.  Previous Surgery:  Left common iliac artery stent, 04/16/08 by Dr.  Myra Gianotti.               SINGLE LEVEL ARTERIAL EXAM                              RIGHT                  LEFT  Brachial:                  138                    146  Anterior tibial:           100                    79  Posterior tibial:          107                    71  Peroneal:  Ankle/brachial index:      0.73                   0.54  Previous ABI/date:         10/23/08, 0.75         10/23/08, 0.53   AORTA-ILIAC DUPLEX EXAM  Aorta - Proximal     Not visualized  Aorta - Mid          96 cm/s  Aorta - Distal       97 cm/s   RIGHT                                   LEFT  134 cm/s          CIA-PROXIMAL          194 cm/s  235 cm/s          CIA-DISTAL            263 cm/s  Not visualized    HYPOGASTRIC           Not visualized  160 cm/s          EIA-PROXIMAL          202 cm/s  134 cm/s          EIA-MID               213 cm/s  151 cm/s          EIA-DISTAL            119 cm/s   IMPRESSION:  1. Bilateral ankle brachial indices appear stable from previous study.  2. Patent aorta and iliac arteries bilaterally.  3. Stable mildly elevated iliac artery velocities bilaterally.  4. Suboptimal visualization in areas due to body habitus, bowel gas,      and excessive acoustic shadowing.   ___________________________________________  V. Charlena Cross, MD   AS/MEDQ  D:  04/05/2009  T:  04/05/2009  Job:  [id number]

## 2010-11-15 NOTE — Assessment & Plan Note (Signed)
OFFICE VISIT   BRAXDEN, LOVERING  DOB:  1935-10-12                                       05/04/2008  ZOXWR#:[id number]   REASON FOR VISIT:  Follow-up claudication.   HISTORY:  This is a 75 year old gentleman that I initially saw for left  leg claudication.  He had an ankle brachial index of 0.5 on the left and  1 on the right.  He had claudication at 1 block.  He underwent  arteriogram on 04/16/2008.  At that time, stent deployment was performed  in the left common iliac artery, he comes back in today for follow-up.  He states that his symptoms have now resolved.  He is doing very well at  this time.   PHYSICAL EXAMINATION:  His blood pressure is 135/55, pulse is 60.  General:  He is well-appearing, no distress.  Cardiovascular:  Regular  rate and rhythm, respirations nonlabored.  Extremities:  Warm and well-  perfused, he has some edema in his left leg.   ASSESSMENT/PLAN:  Status post stenting left common iliac artery.   PLAN:  The patient will be placed on a lower extremity bypass protocol,  he will follow up with me in 6 months.   Jorge Ny, MD  Electronically Signed   VWB/MEDQ  D:  05/04/2008  T:  05/05/2008  Job:  [id number]   cc:   Hilda Lias, M.D.  Lyn Records, M.D.  Theressa Millard, M.D.

## 2010-11-15 NOTE — Op Note (Signed)
[name], [name]               ACCOUNT NO.:  [id number]   MEDICAL RECORD NO.:  [id number]          PATIENT TYPE:  AMB   LOCATION:  SDS                          FACILITY:  [facility]   PHYSICIAN:  VDurene Cal IV, MDDATE OF BIRTH:  [birth year]   DATE OF PROCEDURE:  04/16/2008  DATE OF DISCHARGE:                               OPERATIVE REPORT   PREOPERATIVE DIAGNOSIS:  Left leg claudication.   POSTOPERATIVE DIAGNOSIS:  Left leg claudication.   PROCEDURES PERFORMED:  1. Ultrasound access, right common femoral artery.  2. Ultrasound access, left common femoral artery.  3. Abdominal aortogram.  4. Second-order catheterization.  5. Left lower extremity runoff.  6. Stent left common iliac artery (Genesis 8 x 24).   PROCEDURE:  The patient identified in the holding area and taken to room  8.  He was placed supine on table.  Bilateral groins were prepped and  draped in standard sterile fashion.  Time-out was called.  The right  common femoral artery was evaluated with ultrasound.  It  was noted to  be heavily calcified.  Lidocaine 1% was used as local anesthesia.  It  was accessed under ultrasound guidance with 18-gauge needle.  A 0.35  wire was advanced in the aorta under fluoroscopic visualization.  A 5-  French sheath was placed.  Omni flush catheter was placed in L1.  Abdominal aortogram was obtained.  Catheter was pulled down to the  bifurcation and pelvic angiogram was obtained.  I tried to crossed over  the aortic bifurcation with a Omni flush catheter, but due to disease,  had to use a 4-French end-hole.  The end-hole catheter was placed in the  left external iliac artery and left lower extremity runoff was obtained.  Next, pullback pressures were obtained across the left common iliac  artery.  A significant gradient was obtained.   FINDINGS:  Aortogram:  Visualized portions of suprarenal abdominal aorta  showed minimal disease.  There are single renal arteries bilaterally  without evidence of high-grade stenosis.  The right common iliac artery  is patent throughout its course.  The right external iliac artery is  widely patent and right hypogastric artery is widely patent.  There is a  high-grade stenosis at the origin of the left common iliac artery.  The  left external and internal iliac arteries are widely patent.   Left lower extremity:  There is mild disease within the left common  femoral artery.  The left profunda femoral artery is widely patent.  The  left superficial femoral artery is patent throughout its course in the  region of adductor canal.  There are multiple areas of stenoses and  extensive calcification.  The popliteal artery is patent.  The patient  has a three vessel runoff to the ankle.  The dominant artery across the  ankle is the posterior tibial artery.   INTERVENTION:  At this point, I felt the best way to address his inflow  disease would be a retrograde access from the left leg.  The left  femoral artery was evaluated with ultrasound.  Again, extensive  calcification  was seen.  It was after anesthetizing the area with 1%  lidocaine.  The left femoral artery was accessed under ultrasound  guidance and 18-gauge needle.  A 0.35 Wholey wire was advanced into the  iliac artery under fluoroscopic visualization to the point where it met  resistance.  A 6-French bright tip sheath was then placed.  Using a  Kumpe catheter, wire access was obtained into the abdominal aorta.  Next, the sheath was taken into the aorta.  Contrast injections were  performed through the Omni flush catheter, which was brought up into the  aorta from the right side.  A Genesis 8 x 24 balloon expandable stent  was placed at the aortic bifurcation.  Followup study revealed  resolution of stenoses.  Balloon was taken to 12 atmospheres.  Final  study reveals good results.  There is some residual disease at the  origin of the common iliac artery; however, this is not  hemodynamically  significant.  Therefore, I did not elect to proceed with further  intervention.  At this point, catheters and wires were removed.  The  patient was taken to the holding area for sheath pull.   IMPRESSION:  1. High-grade left common iliac artery stenosis successfully treated      with a Genesis 8 x 24 balloon expandable stent.  2. Extensive disease within the left adductor canal.           ______________________________  V. Charlena Cross, MD  Electronically Signed     VWB/MEDQ  D:  04/16/2008  T:  04/16/2008  Job:  [id number]

## 2010-11-15 NOTE — H&P (Signed)
[name], [name] NO.:  [id number]   MEDICAL RECORD NO.:  [id number]          PATIENT TYPE:  INP   LOCATION:  1846                         FACILITY:  [facility]   PHYSICIAN:  Lyn Records, M.D.   DATE OF BIRTH:  1935-07-28   DATE OF ADMISSION:  04/09/2008  DATE OF DISCHARGE:                              HISTORY & PHYSICAL   PROBLEM:  AICD (automatic implantable cardioverter defibrillator)  failure.   HISTORY OF PRESENT ILLNESS:  [name] is a pleasant 75 year old  gentleman with known ischemic cardiomyopathy, coronary artery disease,  BiV pacemaker resynchronization therapy and COPD who presents today with  problems concerning his BiV device.  States he began hearing a beeping  sound this morning.  He called the office and was advised to come in for  a pacemaker check.  Interrogation did identify a problem with the RV  coil in his device enabling the ability of his device to deliver shock  if needed.  He therefore is being admitted to the hospital for lead  revision in the a.m.   REVIEW OF SYSTEMS:  GENERAL:  Generally has felt well.  No recent fever  or chills.  HEENT:  Wears glasses, no hearing loss, epistaxis.  Some  nasal congestion, sore throat.  NECK:  No stiffness.  CHEST:  Recent dry  cough.  No increase in his usual shortness of breath, no wheezing.  No  PND or orthopnea.  CARDIOVASCULAR:  Denies pain, pressure,  tachyarrhythmias, recent ICD discharge.  ABDOMEN:  No nausea or  vomiting, abdominal pain, melena, hematochezia.  GU:  Denies dysuria or  frequency, hematuria, nocturia.  EXTREMITIES:  Recent pedal edema,  chronic low back pain secondary to arthritis and lumbar surgery.  NEUROLOGICAL:  No numbness, tingling, headache, vertigo,  lightheadedness, syncope.   PAST MEDICAL HISTORY:  1. Ischemic cardiomyopathy.  2. Atrial fibrillation.  3. Coronary artery disease, status post CABG x3.  4. BiV pacer, InSync Maximo, model number [id number],  inserted October 14, 2004.  5. Chronic Coumadin therapy.  6. Amiodarone therapy.  7. Peripheral vascular disease.  8. COPD.  9. Hypertension.  10.History of colon cancer status post hemicolectomy.  11.Hyperlipidemia.   PAST SURGICAL HISTORY:  1. CABG x3, mid 59s.  2. Colon cancer, status post resection.  3. Lumbar back surgery x2.  4. Cholecystectomy.   CURRENT MEDICATIONS:  1. Prednisone 10 mg daily.  2. Digitek 0.125 mg daily.  3. Furosemide 40 mg two in the morning and one in the evening.  4. Carvedilol 12.5 mg b.i.d.  5. Zocor 20 mg a day.  6. Coumadin and adjusted doses daily.  7. Nitro-Dur patch 0.2 mg daily.  8. Spiriva 1 inhalation daily.  9. Pulmicort 0.5 mg Respules via nebulizer b.i.d.  10.Amiodarone 200 mg a day.  11.Mucinex 1200 mg b.i.d.  12.Norvasc 5 mg a day.  13.Potassium 20 mEq daily.  14.Atacand 32 mg daily.  15.Proventil 2 puffs q.i.d. p.r.n.   DRUG ALLERGIES:  None known.   SOCIAL HISTORY:  Nonsmoker, no alcohol or illicit drug use.  FAMILY HISTORY:  Noncontributory.   OBJECTIVE:  VITAL SIGNS:  Weight 191.4, pulse 68, blood pressure 140/80.  GENERAL:  A very pleasant gentleman in no acute distress.  SKIN:  Warm and dry.  HEENT:  Unremarkable.  NECK:  No JVD.  No carotid bruits are auscultated.  Supple without  lymphadenopathy.  LUNGS:  Good excursion, decreased breath sounds.  Question faint rhonchi  in the bases.  CARDIAC:  Normal S1-S2 with grade 1/6 systolic murmur, unchanged.  No  rub or click.  ABDOMEN:  Soft, previous well-healed surgical scars.  Bowel sounds  present.  No abdominal bruit.  No pulsatile mass or hepatosplenomegaly.  EXTREMITIES:  +1 pedal edema.  +2 distal pulses.  No clubbing or  cyanosis.  NEUROLOGICAL:  Nonfocal.   Last EKG July 26, 2007, reveals ventricular pacing.   IMPRESSION:  1. Valvular lead failure.  2. Ischemic cardiomyopathy.  3. Coronary artery disease.  4. Chronic Coumadin therapy.  5.  Chronic obstructive pulmonary disease.  6. Dyslipidemia.  7. Hypertension.   PLAN:  Admit to telemetry.  N.p.o. after midnight for rerevision in the  morning by Dr. [name].  His INR is 2.8 today.  Will hold his  Coumadin until after his lead revision.  Follow up with Dr. Katrinka Blazing and  associates.      Tamera C. Lewis, N.P.      Lyn Records, M.D.  Electronically Signed    TCL/MEDQ  D:  04/09/2008  T:  04/09/2008  Job:  [id number]

## 2010-11-15 NOTE — H&P (Signed)
NAMEKENYON, EICHELBERGER NO.:  [id number]   MEDICAL RECORD NO.:  [id number]          PATIENT TYPE:  INP   LOCATION:  2909                         FACILITY:  [facility]   PHYSICIAN:  Brayton El, MD    DATE OF BIRTH:  May 03, 1936   DATE OF ADMISSION:  09/29/2008  DATE OF DISCHARGE:                              HISTORY & PHYSICAL   CHIEF COMPLAINT:  Ventricular tachycardia.   HISTORY OF THE PRESENT ILLNESS:  The patient is a 75 year old white male  with a past medical history significant for ischemic cardiomyopathy with  an ejection fraction of  approximately 20% status post CABG in the mid  1990s and status post intraventricular ICD, severe COPD, atrial  fibrillation, hyperlipidemia, and history of ventricular tachycardia who  presents with chest pressure that he states is exactly the same pressure  he experienced when he had ventricular tachycardia in the past.  The  patient states that tonight after eating a snack he felt the sudden  onset of this chest pressure associated with some shortness of breath  and some mild dizziness.  The sensation lasted approximately 45 minutes.  EMS was called and found the patient to be in ventricular tachycardia  that converted to sinus rhythm versus a paced rhythm without  cardioversion being necessary.  The patient states other than the chest  pressure/ventricular tachycardia he experienced tonight he has been  almost in his normal state of health.  He states that he has had a  slight increase in lower extremity edema and a slight increase in  dyspnea on exertion.  He has been compliant with all his medications and  has had no recent changes to his medications other than amiodarone being  increased from 200 mg to 400 mg daily.   PAST MEDICAL AND SURGICAL HISTORY:  The past medical history is as above  in addition to:  1. History of colon cancer.  2. Hypertension.  3. Status post lumbar back surgery x2.  4. Status post  cholecystectomy.   FAMILY HISTORY:  The patient's father had CHF.   SOCIAL HISTORY:  No tobacco, alcohol or drug.   ALLERGIES:  No known drug allergies.   MEDICATIONS:  1. Coumadin as directed by the INR.  2. Amiodarone 400 mg daily.  3. Coreg 12.5 mg twice a day.  4. Atacand 32 mg daily.  5. Digoxin 0.125 mg by mouth twice a day.  6. Lasix 80 mg in the morning and 40 mg in the evening.  7. Zocor __________ mg in the evening.  8. Norvasc 5 mg daily.  9. Prednisone 10 mg daily.  10.Proventil 2 puffs four times a day as needed.  11.Home O2 on a as needed basis.  12.Potassium chloride 20 mEq daily.   REVIEW OF SYSTEMS:  The review of systems is as noted in the HPI.  All  other systems are reviewed and are negative.   PHYSICAL EXAMINATION:  VITAL SIGNS:  Temperature 97.8, pulse 60,  respirations 16 and blood pressure 123/43.  GENERAL APPEARANCE:  In general the patient is in no acute distress.  HEENT:  The head, eyes, ears, nose, and throat are nonfocal.  NECK:  The neck is supple.  There is a small amount of JVD.  HEART:  The heart has a regular rate and rhythm with very distant heart  sounds.  No murmur, rubs or gallops are heard.  LUNGS:  The lungs have mild crackles at the right base.  ABDOMEN:  The abdomen is soft, nontender and nondistended.  EXTREMITIES:  The extremities have trace to 1+ bilateral lower extremity  edema.  SKIN:  The skin is warm and dry.  NEUROLOGIC:  Neuro exam is nonfocal.  PSYCHIATRIC:  The patient is appropriate with normal levels of insight.   LABORATORY DATA:  Sodium 138, potassium 3.4, CO2 26, BUN 16, creatinine  1.1, and glucose 126.  Hemoglobin 10 and hematocrit 30.  INR 2.3.  Chest  x-ray as read by radiology showed mild pulmonary edema.  Review of the  telemetry strips brought in by EMS:  One strip demonstrates a wide  complex tachycardia at approximately 148 beats per minute.  Of note, the  patient states his defibrillator is set to go off  at 152 beats/minute.  A 12-lead EKG demonstrated what is likely an A-V sequential or dual  chamber pacemaker.  The EKG is relatively unchanged from EKG dated  April 27, 2008.   IMPRESSION:  1. Ventricular tachycardia that is secondary to the patient's known      ischemic cardiomyopathy.  2. Mild volume overload.  3. Hypokalemia.   PLAN:  1. The patient will be admitted to the CCU for further monitoring.  2. We will bolus him with amiodarone 150 mg IV and continue his home      dose of 400 mg daily.  3. We will institute diuresis with Lasix 40 mg IV every 12 hours and      carefully monitor daily ins and outs.  4. The patient will be ruled out for a myocardial infarction.  It is      certainly possible that ischemia is the cause of the patient's      ventricular tachycardia; however, it is somewhat unlikely as he has      not been having any significant symptoms consistent with ischemia      over the past several days.  5. The patient's other home medications as listed above will be      continued.  In addition, we will place the patient on aspirin 81 mg      daily.  The continuation of aspirin on a home basis will be      determined by his primary cardiologist.  6. Lastly, we will replete the patient's potassium and check a      magnesium.  It may also be that the patient needs to be on a higher      dose of potassium at home.     Brayton El, MD  Electronically Signed    SGA/MEDQ  D:  09/29/2008  T:  09/29/2008  Job:  [id number]

## 2010-11-15 NOTE — H&P (Signed)
[name], [name]               ACCOUNT NO.:  [id number]   MEDICAL RECORD NO.:  [id number]          PATIENT TYPE:  INP   LOCATION:                               FACILITY:  [facility]   PHYSICIAN:  Francisca December, M.D.  DATE OF BIRTH:  10-Aug-1935   DATE OF ADMISSION:  04/24/2008  DATE OF DISCHARGE:                              HISTORY & PHYSICAL   REASON FOR ADMISSION:  Ventricular tachycardia.   HISTORY OF PRESENT ILLNESS:  [name] is a 75 year old male patient  with known ischemic cardiomyopathy as well as coronary artery disease.  He has a BiV A-ICD in place.  On April 09, 2008, he called the office  and stated that he was hearing beeping sound this morning.  He was  advised to come in for pacemaker check.  Interrogation of the device  identified a problem with the RV coil, thus enabling the ability of the  device to deliver shock therapy if needed.  He was then admitted to the  hospital for revision the following day, April 10, 2008.   Since the procedure, apparently he has been doing well.  He was in the  office today for a routine hospital followup, and in the waiting room,  he began having a violent coughing spell.  Then he states he did not  feel well.  He started feeling sweaty and dizzy.  He was then brought  back to the patient room in our office and a stat EKG was performed and  this showed a ventricular tachycardia.  From my understanding, the  device was interrogated, and apparently, ventricular tachycardia, rate  was too slow to sense; therefore, the patient did not receive a shock.  The dysrrythmia wasterminated with burst pacing through the device and  lidocaine 75 mg was given in the office after the conversion.  He is now  being admitted with a diagnosis of ventricular tachycardia.  He will be  placed on amiodarone therapy.   REVIEW OF SYSTEMS:  As above, otherwise negative.   PAST MEDICAL HISTORY:  1. Ischemic cardiomyopathy, atrial fibrillation, CAD  status post CABG      x3.  2. BiV pacer in 2006 with RV lead change out, [employer] Durata lead on      April 10, 2008, by Dr. Sharrell Ku.  3. Long-term Coumadin therapy.  4. Amiodarone therapy.  5. Peripheral vascular disease status post left common iliac artery      stenosis, treated with an expandable stent by Dr. Myra Gianotti on      April 16, 2008.  6. COPD.  7. Hypertension.  8. History of colon cancer status post hemicolectomy.  9. Hyperlipidemia.  10.Status post lumbar back surgery x2.  11.Status post cholecystectomy.  12.Long-term medication use.   CURRENT MEDICATIONS:  1. Prednisone 10 mg a day.  2. Digitek 0.125 mg a day.  3. Lasix 40 mg 2 tablets in the morning, 1 tablet in the evening.  4. Carvedilol 12.5 mg b.i.d.  5. Zocor 20 mg a day.  6. Coumadin.  7. Nitro-Dur patch 0.2 mg daily.  8. Spiriva  inhaler daily.  9. Pulmicort nebulizer b.i.d.  10.Amiodarone 200 mg a day.  11.Mucinex 1200 mg b.i.d.  12.Norvasc 5 mg a day.  13.Potassium 20 mEq daily.  14.Atacand 32 mg daily.  15.Proventil 2 puffs q.i.d. p.r.n.   ALLERGIES:  No known drug allergies.   SOCIAL HISTORY:  Does not smoke, no alcohol or illicit drug use.   FAMILY HISTORY:  Father had CHF.  Mother had hypertension and a stroke.   PHYSICAL EXAMINATION:  VITAL SIGNS:  Pulse is 70, respirations 20, blood  pressure 139/53, and afebrile.  HEENT:  Grossly normal.  Sclerae clear.  Conjunctivae normal.  Nares  without drainage.  NECK:  No carotid or subclavian bruit.  No JVD or thyromegaly.  CHEST:  Clear to auscultation bilaterally.  No wheezing or rhonchi.  HEART:  Regular rate and rhythm.  Soft 1/6 systolic murmur.  ABDOMEN:  Obese, positive bowel sounds, nontender, and nondistended.  No  mass.  No bruits.  EXTREMITIES:  No lower extremity edema.  NEUROLOGIC:  Cranial nerves II-XII grossly intact.  SKIN:  Warm and dry.  PSYCH:  Normal mood and affect.   ASSESSMENT/PLAN:  1. Ventricular tachycardia:   We will place on IV amiodarone, check      electrolytes.  2. Ischemic cardiomyopathy status post biventricular automatic      implantable cardioverter-defibrillator implantation with no right      ventricular lead revision earlier this month, last documented      ejection fraction was less than 20%.  3. Coronary artery disease:  History of coronary artery bypass graft.  4. History of atrial fibrillation.  5. Chronic Coumadin therapy.  6. Amiodarone therapy.  7. Peripheral vascular disease, as above.  8. Chronic obstructive pulmonary disease.  9. Hypertension.  10.Hyperlipidemia.  11.History of colon cancer status post hemicolectomy.  12.History of lumbar surgery x2.  13.Status post cholecystectomy.  14.Long-term medication use.   The patient has been seen and examined by Dr. Corliss Marcus.  We will  check electrolytes and start on amiodarone therapy.  The device has been  reprogrammed to deliver therapy in this VT zone in the future.      Guy Franco, P.A.      Francisca December, M.D.  Electronically Signed    LB/MEDQ  D:  04/24/2008  T:  04/25/2008  Job:  [id number]   cc:   Lyn Records, M.D.  [name]. [name], MD

## 2010-11-15 NOTE — Assessment & Plan Note (Signed)
OFFICE VISIT   [name], [name]  DOB:  06/15/1936                                       04/06/2008  EAVWU#:[id number]   REASON FOR VISIT:  Evaluate claudication left greater than right.   HISTORY:  This is a 75 year old gentleman whom I am seeing at the  request of Dr. Jeral Fruit and Dr. Earl Gala for evaluation of claudication.  The patient had recent ultrasound which showed ankle brachial index of  0.5 on the left and 1 on the right.  The patient states that at  approximately half a block he has bilateral claudication which he  describes as his legs giving out.  He does not have rest pain.  Does not  have ulceration.   Patient has a history of heart disease.  He is status post coronary  bypass graft.  He has hypercholesterolemia which is under control with  medications as is his blood pressure.  He has a history of COPD  secondary to smoking but quit smoking 5 years ago.   REVIEW OF SYSTEMS:  GENERAL:  Negative fevers, chills weight gain weight  loss.  CARDIAC:  Negative.  PULMONARY:  Positive for bronchitis, home oxygen.  GI:  Negative.  GU:  Negative.  VASCULAR:  Negative except for as above.  NEURO:  Negative.  ORTHO:  Negative.  PSYCH:  Negative.  ENT:  Negative.  HEME:  Is on Coumadin.   FAMILY HISTORY:  Noncontributory.   SOCIAL HISTORY:  Married.  Does not smoke.  Has a history of smoking,  but quit 5 years ago.  Does not drink alcohol.   MEDICATIONS:  Include digoxin, antacid, amlodipine, Lasix, carvedilol,  simvastatin, nitro patch, nitroglycerin, Spiriva, potassium, Pulmicort,  albuterol, Proventil Hydrocodone, Coumadin, prednisone, amiodarone,  Mucinex.   ALLERGIES:  None.   PHYSICAL EXAMINATION:  Blood pressure 152/91, pulse is 68.  Generally,  he is well appearing, no acute distress.  HEENT:  Normocephalic,  atraumatic.  Pupils equal.  Sclerae anicteric.  Neck is supple.  No JVD.  Cardiovascular:  Regular rate and rhythm.   Pulmonary:  Lungs clear  bilaterally.  Abdomen:  Soft, nontender.  Extremities:  He has palpable  femoral pulses.  Pedal pulses not palpable.  No ulceration.  Neuro:  Cranial nerves 2-12 grossly intact.  Psych:  He is alert and oriented  x3.   ASSESSMENT/PLAN:  Bilateral claudication left greater than right.   PLAN:  The patient will be scheduled for arteriogram.  I will plan on  accessing the right side, studying both legs, and intervening on the  left if possible.  Risks and benefits discussed with the patient.  He  has been scheduled for Thursday, October 15.  He is going to stop his  Coumadin October 10.   Jorge Ny, MD  Electronically Signed   VWB/MEDQ  D:  04/06/2008  T:  04/07/2008  Job:  [id number]   cc:   Hilda Lias, M.D.  Lyn Records, M.D.  Dr. Earl Gala

## 2010-11-15 NOTE — Discharge Summary (Signed)
NAMEZYREE, TRAYNHAM               ACCOUNT NO.:  [id number]   MEDICAL RECORD NO.:  [id number]          PATIENT TYPE:  INP   LOCATION:  6531                         FACILITY:  [facility]   PHYSICIAN:  Lyn Records, M.D.   DATE OF BIRTH:  Nov 19, 1935   DATE OF ADMISSION:  04/24/2008  DATE OF DISCHARGE:  04/27/2008                               DISCHARGE SUMMARY   DISCHARGE DIAGNOSES:  1. Ventricular tachycardia, spontaneous conversion back to normal      sinus rhythm.  2. Ischemic cardiomyopathy with biventricular pacer in 2006 with a      recent RV lead change out on April 10, 2008 by Dr. [name].  3. Coronary artery disease status post coronary artery bypass      grafting.  4. History of atrial fibrillation.  5. Long-term Coumadin therapy.  6. Amiodarone therapy.  7. Peripheral vascular disease status post left common iliac artery      stenosis status post stent placement by Dr. Myra Gianotti on April 16, 2008.  8. Chronic obstructive pulmonary disease.  9. Hypertension.  10.History of colon cancer status post hemicolectomy.  11.Hyperlipidemia.  12.Status post lumbar back surgery x2.  13.Status post cholecystectomy.  14.Long-term medication use.   HOSPITAL COURSE:  Mr. Girtman called this office on the April 09, 2008, stating that he was hearing a beating sound on that morning.  He  was advised to come in for pacemaker check.  Interrogation of the device  identified the problem with RV coil thus enabling the ability of the  device to deliver shock therapy as needed.  He was then admitted to the  hospital for revision on the following day and this went well.   He was in the office on April 24, 2008, for routine check.  He had a  coughing spell and after that, he felt bad, diaphoretic, and dizzy.  He  was brought back to the examination room and he was found to be in  ventricular tachycardia.  He apparently converted back to sinus rhythm  without intervention, but he  was given lidocaine.  He was brought over  to the emergency room and there he was bolused with amiodarone and then  placed on amiodarone drip.  He was then kept in the hospital for several  days.   During this time, he was switched over to oral amiodarone and by April 27, 2008, he was felt to be ready for discharge to home.   Lab work includes a PT of 30.5, INR of 2.7.  Hemoglobin 10.1, hematocrit  30.9, platelets 260, white count 7.9.  Cardiac enzymes normal.  Total  cholesterol 107, triglycerides 78, HDL 35, LDL 56, BNP 188.  TSH 0.457,  magnesium 2.2.   DISCHARGE MEDICATIONS:  1. Prednisone 10 mg a day.  2. Digitek 0.125 mg a day.  3. Lasix 40 mg 2 tablets in the morning 1 tablet in the evening.  4. Carvedilol 12.5 mg twice a day.  5. Zocor as prior to admission.  6. Coumadin as prior to admission.  7. Nitro-Dur patch  0.2 mg per hour daily as before.  8. Spiriva inhaler daily.  9. Pulmicort inhaler daily.  10.Amiodarone 200 mg 2 tablets daily.  11.Mucinex 1200 mg twice a day.  12.Norvasc 5 mg a day.  13.Potassium 20 mEq daily.  14.Atacand 72 mg daily.  15.Proventil 2 puffs 4 times a day as needed.   The patient is to remain on a low-sodium heart-healthy diet.  Increase  activity slowly.  Return for Coumadin Clinic at the end of the week, and  I will have him follow up with Memory Dance, nurse practitioner with Dr.  Katrinka Blazing early next week.      Guy Franco, P.A.      Lyn Records, M.D.  Electronically Signed    LB/MEDQ  D:  04/27/2008  T:  04/27/2008  Job:  [id number]

## 2010-11-15 NOTE — Discharge Summary (Signed)
[name], [name] NO.:  [id number]   MEDICAL RECORD NO.:  [id number]          PATIENT TYPE:  INP   LOCATION:  2909                         FACILITY:  [facility]   PHYSICIAN:  Hillis Range, MD       DATE OF BIRTH:  18-Dec-1935   DATE OF ADMISSION:  09/29/2008  DATE OF DISCHARGE:  09/30/2008                               DISCHARGE SUMMARY   No known drug allergies.     Time for this dictation, greater than 35 minutes.   FINAL DIAGNOSES:  1. Admitted with symptomatic ventricular tachycardia.      a.     The ventricular tachycardia was below ventricular       tachycardia detection level on device.  2. Acute on chronic congestive heart failure, probably precipitating      the patient's ventricular tachycardia.  3. Chronic amiodarone 400 mg daily.      a.     Amiodarone level was checked on September 29, 2008, pending at       discharge.  4. Coreg increased by Dr. Johney Frame from 12.5 mg twice daily to 25 mg      twice daily.  5. Heart rate is stuck at 60 beats per minute.  The patient has a      biventricular implantable cardioverter-defibrillator.      a.     Rate-response program on this admission.  6. Reprogram device, ventricular tachycardia zone lower to 140.      a.     Ventricular fibrillation zone increased to 200.      b.     A fast ventricular tachycardia zone from 176 to 200.      c.     Antitachycardia pacing parameters turned on.  7. Check TSH and T4, pending at discharge.   BRIEF HISTORY:  [name] is a 75 year old male with a past medical  history significant for cardiomyopathy.  The patient denies having a  previous myocardial infarction.  He has had congestive heart failure,  diagnosed as early as 47.  He was hospitalized with syncope and  congestive heart failure in 1993.  He underwent coronary artery bypass  graft surgery in 1998.  He has an ischemic cardiomyopathy.  His ejection  fraction is estimated at 55-15%.  This was done at a  catheterization in  March 2006.  At that time, all grafts were patent.   The patient has been described in the past as end-stage COPD but does  not use oxygen at home at this time.  He has devices implanted.  His  original device was a BiV pacemaker.  The BiV ICD was upgraded in March  2006.  He was hospitalized in August 2006 with an exacerbation of  COPD/community-acquired pneumonia/ventilator-depended respiratory  failure and atrial arrhythmias.  At that hospitalization, he was started  on amiodarone and has continued on that stents.  He had a right  ventricular lead revision on April 09, 2008.  He had office visit  followup after the lead revision on April 24, 2008.  At that time, he  had a coughing fit  and sudden onset of wide-complex tachycardia,  diagnosed as VT.  This was a VT too slow for detection on his ICD.  It  was terminated by a burst pacing through the ICD.   The patient has rehab exercise 3 times a week.  He was exercising on  September 28, 2008, at 11:30 and the that evening, he awoke hungry and went  to have some peanut butter crackers.  He had sudden onset of those  familiar symptoms of dizziness, chest discomfort, and diaphoresis.  He  took his pulse and noticed that his heart was racing.  He came to the  emergency room.  He denies palpitations and denies any dyspnea.  The  patient says he has not been feeling dyspnea on exertion or particularly  waterlogged in the recent few weeks.   HOSPITAL COURSE:  He was seen in consult here by Dr. Hillis Range who  described the patient as having an episode of ventricular tachycardia,  likely precipitated by acute on chronic congestive heart failure, which  he classifies as III chronic systolic congestive heart failure.  The  ventricular tachycardia was below the VT detection zone of his  cardioverter-defibrillator.  The OptiVol was increased by the device  interrogation on September 29, 2008.  The patient's device was  reprogrammed  as described above.  His rate response was turned on.  ATP parameters  were also programmed on.  He will see Dr. [name] in 3-4 weeks.  The  appointment is on Tuesday, Nov 03, 2008, at 2:30.  Dr. Johney Frame would like  to check the amiodarone level since the patient has been on chronic  amiodarone 400 mg daily since August 2008.  We will also check TSH and  T4 levels.   LABORATORY STUDIES ON THIS ADMISSION:  Basic metabolic panel, sodium  137, potassium 3.6, chloride 105, carbonate 26, BUN is 15, creatinine  0.97, glucose 109.  Alkaline phosphatase 85, SGOT 48, SGPT is 62.  Once  again, troponin levels pending, and troponin I is less than 0.05 and  0.01.      Maple Mirza, [place]      Hillis Range, MD  Electronically Signed    GM/MEDQ  D:  09/30/2008  T:  10/01/2008  Job:  [id number]   cc:   Lyn Records, M.D.  Etta Grandchild. [name], MD

## 2010-11-15 NOTE — Progress Notes (Signed)
  Subjective:    Patient ID: [name], male    DOB: 09/03/1935, 75 y.o.   MRN: [id number]  HPI 75 yo with known hx of COPD   11/15/10 Acute OV  Pt presents for an acute office visit. Complains of tightness in chest, increased SOB, prod cough with light green mucus x1day.  Over last 2 week cough is getting worse,  Mainly dry but yesterday started coughing up green mucus. Was seen in ER 1 month ago w/ COPD flare tx w/ steroid burst and Doxycycline. He got better and back to baseline until last couple of weeks.  OTC not helping .    Review of Systems Constitutional:   No  weight loss, night sweats,  Fevers, chills, fatigue, or  lassitude.  HEENT:   No headaches,  Difficulty swallowing,  Tooth/dental problems, or  Sore throat,                No sneezing, itching, ear ache, nasal congestion, post nasal drip,   CV:  No chest pain,  Orthopnea, PND, swelling in lower extremities, anasarca, dizziness, palpitations, syncope.   GI  No heartburn, indigestion, abdominal pain, nausea, vomiting, diarrhea, change in bowel habits, loss of appetite, bloody stools.   Resp:    No coughing up of blood.    No chest wall deformity  Skin: no rash or lesions.  GU: no dysuria, change in color of urine, no urgency or frequency.  No flank pain, no hematuria   MS:  No joint pain or swelling.  No decreased range of motion.     Psych:  No change in mood or affect. No depression or anxiety.          Objective:   Physical Exam GEN: A/Ox3; pleasant , NAD, elderly chronically ill appearing.   HEENT:  [place]/AT,  EACs-clear, TMs-wnl, NOSE-clear, THROAT-clear, no lesions, no postnasal drip or exudate noted.   NECK:  Supple w/ fair ROM; no JVD; normal carotid impulses w/o bruits; no thyromegaly or nodules palpated; no lymphadenopathy.  RESP  Coarse BS w/ no trace exp wheezing.   CARD:  RRR, no m/r/g  , tr-1+ peripheral edema, pulses intact, no cyanosis or clubbing.  GI:   Soft & nt; nml bowel sounds; no  organomegaly or masses detected.  Musco: Warm bil, no deformities or joint swelling noted.   Neuro: alert, no focal deficits noted.    Skin: Warm, no lesions or rashes          Assessment & Plan:

## 2010-11-15 NOTE — Discharge Summary (Signed)
NAMEONESIMO, LINGARD               ACCOUNT NO.:  [id number]   MEDICAL RECORD NO.:  [id number]          PATIENT TYPE:  INP   LOCATION:  4710                         FACILITY:  [facility]   PHYSICIAN:  Hilda Lias, M.D.   DATE OF BIRTH:  1936/04/06   DATE OF ADMISSION:  12/14/2006  DATE OF DISCHARGE:  12/22/2006                               DISCHARGE SUMMARY   ADMISSION DIAGNOSES:  1. Degenerative disk disease with spondylosis at the level of L4-5 and      L5-S1, status post lumbar surgery.  2. History of cancer of colon.   FINAL DIAGNOSES:  1. Degenerative disk disease with spondylosis at the level of L4-5 and      L5-S1, status post lumbar surgery.  2. History of cancer of colon.   HISTORY OF PRESENT ILLNESS:  Mr. Panas was a gentleman who was seen  in my office complaining of pain radiating to both legs,which improved  slightly when he walks, and getting worse when he lives in bed. The  patient has had surgery in the past, twice.  He has cancer of the colon,  and also open heart surgery.   LABORATORY DATA:  Within normal limits at the time of discharge.   HOSPITAL COURSE:  The patient was sent into surgery and L4-5 and L5-S1  fusion was done.  After surgery the patient did well, but later on he  developed urinary retention associated with some difficulty breathing.  He was seen by pulmonologist from [facility].  He was transferred  to the intensive care unit.  Eventually, the patient with conservative  treatment did improve.  Because he was going to require quite a bit of  rehabilitation, he was transferred to the rehabilitation unit on December 22, 2006.   CONDITION ON DISCHARGE:  Improvement to __________ his back.   DIET:  Regular.   ACTIVITY:  Will be up to rehabilitation.   MEDICATIONS:  He will continue medications he was taking while he was at  [facility].   FOLLOW UP:  I will follow Mr. Packard in the rehabilitation unit, and  later on in my  office.           ______________________________  Hilda Lias, M.D.     EB/MEDQ  D:  01/22/2007  T:  01/22/2007  Job:  [id number]

## 2010-11-15 NOTE — Procedures (Signed)
AORTA-ILIAC DUPLEX EVALUATION   INDICATION:  Follow up left external iliac stenting.   HISTORY:  Diabetes:  No.  Cardiac:  CHF.  Hypertension:  Yes.  Smoking:  Quit.  Previous Surgery:               SINGLE LEVEL ARTERIAL EXAM                              RIGHT                  LEFT  Brachial:                  144                    141  Anterior tibial:           80                     87  Posterior tibial:          107                    91  Peroneal:  Ankle/brachial index:      0.74                   0.67  Previous ABI/date:         04/05/2009, 0.73       04/05/2009, 0.54   AORTA-ILIAC DUPLEX EXAM  Aorta - Proximal     129 cm/s  Aorta - Mid          79 cm/s  Aorta - Distal       75 cm/s   RIGHT                                   LEFT  243 cm/s          CIA-PROXIMAL          175 cm/s  140 cm/s          CIA-DISTAL            190 cm/s                    HYPOGASTRIC  174 cm/s          EIA-PROXIMAL          171 cm/s  164 cm/s          EIA-MID               150 cm/s  190 cm/s          EIA-DISTAL            154 cm/s   IMPRESSION:  Patent left external iliac stent with slightly increased  velocity with no evidence of focal stenosis.   ___________________________________________  V. Charlena Cross, MD   MG/MEDQ  D:  10/18/2009  T:  10/18/2009  Job:  [id number]

## 2010-11-15 NOTE — Consult Note (Signed)
[name], [name] NO.:  [id number]   MEDICAL RECORD NO.:  [id number]          PATIENT TYPE:  INP   LOCATION:  3003                         FACILITY:  [facility]   PHYSICIAN:  Lyn Records, M.D.   DATE OF BIRTH:  13-Jan-1936   DATE OF CONSULTATION:  12/20/2006  DATE OF DISCHARGE:                                 CONSULTATION   INDICATION FOR CONSULTATION:  Automatic implantable defibrillator  discharge.   CONCLUSIONS:  1. Automatic implantable cardioverter-defibrillator discharge      secondary to tachyarrhythmia, most likely atrial fibrillation with      rapid ventricular rate.  Cannot totally exclude the possibility of      ventricular tachycardia.  Based on telemetry information from the      patient's device, it appeared that he was in an atrial arrhythmia      before he developed the ventricular arrhythmia that led to shock.  2. Ischemic cardiomyopathy.      a.     Ejection fraction less than 20%.      b.     Class II-III congestive heart failure.      c.     Coronary atherosclerotic heart disease with the patient       being status post coronary artery bypass grafting in the mid-       1990s.  3. Recent lumbar surgery by Dr. Jeral Fruit.  4. Severe chronic obstructive pulmonary disease with oxygen-      dependency.  5. Colon cancer, status post resection.  6. History of atrial fibrillation and atrial flutter.  7. Hyperlipidemia.   RECOMMENDATIONS:  1. Keep potassium level greater than 3.8.  2. Telemetry bed for the next 24 hours.  3. Increase amiodarone to 200 mg b.i.d.  4. If any recurrence of shock with normal potassium, the patient      should be required to stop driving for 6 months.  5. No specific cardiac evaluation at this time unless subsequent      difficulty.   COMMENTS:  The patient is 51 and was admitted to the hospital for lumbar  surgery.  He underwent successful surgery on December 14, 2006.  Last  evening he developed a shock from his  defibrillator and we are asked to  consult.  Of note, his potassium was 3.2 at the time of discharge.  The  patient had no antecedent or subsequent cardiovascular complaints.  There was no lightheadedness, dizziness, tachypalpitation.  He has not  had angina.  There is no increase in shortness of breath or other  complaint.   His typical medical regimen is:  1. Ventolin inhaler.  2. Amiodarone 200 mg per day.  3. Norvasc 5 mg per day.  4. Pulmicort daily.  5. Coreg 12.5 mg b.i.d.  6. Cipro 500 mg b.i.d.  7. Digoxin 0.125 mg per day.  8. Colace.  9. Lasix 40 mg each evening and 80 mg each morning.  10.Mucinex.  11.Atrovent inhalers.  12.Avapro 300 mg a day.  13.Nitro patch 0.2 mg per hour per day.  14.K-Dur 20 mEq per day.  15.Prednisone  10 mg per day.  16.Zocor 20 mg p.o. daily.  17.Flomax 0.4 mg per day.   SOCIAL HISTORY:  The patient is a former greater than 100 pack-year  smoker.  He denies ethanol consumption.   REVIEW OF SYSTEMS:  He denies chills and fever.  He does have a wound on  his buttocks that represents a pressure sore and that is causing him  some discomfort.   PHYSICAL EXAMINATION:  GENERAL:  On exam, the patient is sitting eating  lunch.  He is in no acute distress.  The AICD discharge was  approximately 12 hours ago.  VITAL SIGNS:  His blood pressure is 118/50, heart rate is 63,  respirations are 18.  HEENT:  Normal.  CHEST:  Clear.  CARDIAC:  No gallop, no rub, no murmur, no click.  ABDOMEN:  Soft.  EXTREMITIES:  1+ pitting edema bilaterally.  NEUROLOGIC:  Grossly intact.   His potassium is 3.2.  BUN and creatinine are 25 and 0.79.  Troponin is  0.05.  EKG today demonstrates ventricular pacing with atrial tracking at  a rate of 84.   DISCUSSION:  The patient had an AICD discharge.  It appears that he was  in a supraventricular arrhythmia, possibly atrial fibrillation, and  subsequently developed a ventricular arrhythmia that led to shock.  His   potassium is 3.2.  He is on amiodarone.  He is also on beta blocker  therapy.  Perhaps a higher dose of beta blocker therapy would slow rate  if he develops an atrial arrhythmia.  However, he has severe COPD and I  am concerned about increasing the dose of beta blocker.  We will boost  his dose of amiodarone for 2 weeks.  He will then go back to 200 mg per  day thereafter.  If any subsequent AICD discharges occur with normal  electrolytes, we need to prevent the patient from driving.  I do not  think that is necessary at this time.  We need to maintain a potassium  level greater than 3.8.  We will monitor on telemetry over the next 24  hours to look for any arrhythmias or evidence that more aggressive  evaluation or management is needed.      Lyn Records, M.D.  Electronically Signed     HWS/MEDQ  D:  12/20/2006  T:  12/20/2006  Job:  [id number]   cc:   Theressa Millard, M.D.  Hilda Lias, M.D.

## 2010-11-15 NOTE — Consult Note (Signed)
[name], [name]               ACCOUNT NO.:  [id number]   MEDICAL RECORD NO.:  [id number]          PATIENT TYPE:  INP   LOCATION:  3003                         FACILITY:  [facility]   PHYSICIAN:  Lucrezia Starch. Earlene Plater, M.D.  DATE OF BIRTH:  06-27-1936   DATE OF CONSULTATION:  12/18/2006  DATE OF DISCHARGE:                                 CONSULTATION   REASON FOR CONSULTATION:  Urinary retention.  Status post surgery.   HISTORY:  The patient underwent back surgery on December 14, 2006.  However,  since her surgery has generated no spontaneous void, requiring multiple  I and O caths.  Foley catheter was placed earlier today and Flomax was  initiated.  The patient states he has had 7 surgeries before and  required prolonged catheterization with each.  However, voiding always  returns, just slow per patient report.  Prior to surgery had no  suprapubic pain, no dysuria, no urgency, no frequency.  Nocturia 2 to 3  times nightly.   On December 14, 2006, L4, L5 lumbar spine repair with fusion for  degenerative disk disease, spondylolisthesis with stenosis, and  radiculopathy.  The surgery has been complicated by hypoxemia felt to be  atelectasis, and increased BUN and creatinine.  The patient is also  followed by pulmonary critical medicine and internal medicine.   PAST MEDICAL HISTORY:  Significant for colon cancer, COPD, history of  DVT, pacemaker, ICD, carotid artery disease, status post CABG, and CHF.   MEDICATIONS:  1. Albuterol 2.5 mg inhaled every six.  2. Amiodarone 200 mg p.o. daily.  3. Norvasc 5 mg p.o. daily.  4. Carbetolol 12.5 mg p.o. b.i.d.  5. Budesonide 0.5 mg inhaled every six.  6. Cipro 500 mg p.o. b.i.d.  7. Digoxin 0.125 mg every day.  8. Colace 100 mg b.i.d.  9. Lasix 80 mg at 0800.  10.Mucinex 1200 mg daily.  11.Heparin 5000 units subcutaneous every eight hours.  12.Atrovent 0.5 mg inhaled every six hours.  13.Avapro 300 mg daily.  14.KCl 20 mEq p.o. every day.  15.Zocor 20 mg p.o. daily.  16.Nitroglycerin patch 0.2 mg daily.  17.Prednisone 10 mg p.o. daily.  18.Flomax 0.4 mg p.o. daily.   ALLERGIES:  NONE.   SOCIAL HISTORY:  Married, lives with spouse.  Occasional EtOH.  Tobacco  use, 80-pack/year history.  Quit in 2005.    Family Hx. Non-contributory  ROS: Otherwise benign   ASSESSMENT:  GENERAL:  A 75 year old white male who is in no acute  distress.  Well-groomed, well-nourished, well-developed.  Awake, alert,  and oriented times 3.  VITAL SIGNS:  130/70, temperature 98, pulse 76, respirations 18.  ABDOMEN:  Soft, nontender.  GU:  Foley intact draining clear urine.   Sodium 137, potassium 4.7, chloride 102, CO2 27, BUN 56, creatinine  1.26, glucose 173.  White count 15.1, hemoglobin 8.5, hematocrit 25.5,  and platelets 167,000.   IMPRESSION:  Urinary retention after surgery.  Rising creatinine.   PLAN:  Maintain Foley.  Continue Flomax.  Voiding trial on Friday or day  before discharge.  If needed, may reinsert Foley catheter if  no  urination.  Have patient follow up with Dr. Earlene Plater after discharge.  Medicine continues to follow for rising creatinine felt to be related to  hydration.     ______________________________  Alessandra Bevels. Chase Picket, FNP-C      Ronald L. Earlene Plater, M.D.  Electronically Signed    JML/MEDQ  D:  12/18/2006  T:  12/19/2006  Job:  [id number]   cc:   Hilda Lias, M.D.

## 2010-11-15 NOTE — Procedures (Signed)
LOWER EXTREMITY ARTERIAL DUPLEX   INDICATION:  Follow up left stent.   HISTORY:  Diabetes:  No  Cardiac:  Yes  Hypertension:  Yes  Smoking:  Previous  Previous Surgery:  04/16/2008 left CIA stent.   SINGLE LEVEL ARTERIAL EXAM                          RIGHT                LEFT  Brachial:               155                  147  Anterior tibial:        63                   79  Posterior tibial:       70                   68  Peroneal:  Ankle/Brachial Index:   0.45                 0.51   LOWER EXTREMITY ARTERIAL DUPLEX EXAM   DUPLEX:  Technically difficult study secondary to bowel gas.   Left CIA stent and EIA are patent.   The left EIA has significant stenosis with velocities that go from 86  cm/sec. to 291 cm/sec.   IMPRESSION:  1. Patent left iliac stent.  Unable to rule out stent stenosis due to      bowel gas.  2. Significant stenosis of the proximal left EIA.  3. Significantly decreased ankle-brachial indices when compared to      previous study of October 18, 2009.   ___________________________________________  V. Charlena Cross, MD   LT/MEDQ  D:  07/19/2010  T:  07/19/2010  Job:  [id number]

## 2010-11-15 NOTE — H&P (Signed)
[name], [name] NO.:  [id number]   MEDICAL RECORD NO.:  [id number]          PATIENT TYPE:  IPS   LOCATION:  4031                         FACILITY:  [facility]   PHYSICIAN:  Ranelle Oyster, M.D.DATE OF BIRTH:  06/16/1936   DATE OF ADMISSION:  12/22/2006  DATE OF DISCHARGE:                              HISTORY & PHYSICAL   CHIEF COMPLAINTS:  Low back and leg pain.   HISTORY OF PRESENT ILLNESS:  This is a 75 year old white male, history  of CHF and COPD was well as ventricular tachycardia and atrial  fibrillation on chronic Coumadin, who was admitted on June 13 with  increasing low back and leg pain.  He has had two prior lumbar back  surgeries.  His x-ray showed L4 on L5 spondylolisthesis with stenosis as  well as L5-S1 radiculopathy.  The patient underwent L4-5 laminectomy and  facetectomy and total L4-5 diskectomy on the same day by Dr. Jeral Fruit.  The patient was allowed out of bed with back brace.  The patient has  been followed by cardiology and critical care medicine for  cardiopulmonary issues.  He is receiving aggressive pulmonary toilet.  Breathing has generally been stable.  He the patient did have urine  retention initially, for which Flomax was added.  His Foley catheter was  discontinued this morning.  The patient resumed Coumadin yesterday per  his home routine.  The patient continues to have deficits related to his  mobility, balance and self-care and thus was admitted to inpatient rehab  today.   REVIEW OF SYSTEMS:  Notable for palpitations, low back pain, weakness,  shortness of breath.  The patient had been in pulmonary rehab  previously.  Other pertinent positives are listed above and a full  review is in the written H&P.   PAST MEDICAL HISTORY:  1. CHF.  2. CAD with CABG.  3. Atrial fibrillation with AICD.  4. COPD.  5. Colon cancer.  6. Ventricular tachycardia.  7. Remote history tobacco use.   The patient denies alcohol.   FAMILY  HISTORY:  Positive for CAD.   SOCIAL HISTORY:  The patient lives with the wife.  The wife works part-  time.  He has no other local family.  They have a two-level house with a  bedroom downstairs.  Four steps to enter.  He was independent prior to  arrival although was short-winded.   FUNCTIONAL STATUS:  The patient currently needs minimum to moderate  assistance with basic mobility and self-care.   ALLERGIES:  None.   HOME MEDICATIONS:  1. Klor-Con 20 mEq daily.  2. Digoxin 0.125 mg daily.  3. Lasix 40 mg two in the a.m., one in p.m.  4. Prednisone 10 mg daily.  5. Morphine IR 15 mg q.6h. p.r.n.  6. Altace daily.  7. Coumadin daily.  8. Simvastatin 20 mg daily.  9. Nitro patch 0.2 mg daily.  10.Amlodipine 5 mg daily.  11.Amiodarone 200 mg daily.  12.Carvedilol 12.5 mg b.i.d.  13.Pulmicort b.i.d.  14.Xopenex b.i.d.   LABORATORY DATA:  Hemoglobin 8.5, white count 15, platelets 167,000.  Sodium 136  potassium 5.0, BUN and creatinine 16 and 0.8.   PHYSICAL EXAMINATION:  GENERAL:  Blood pressure is 141/56, pulse is 72,  respiratory rate 18, saturating 94% on room air.  Temperature 98.1.  GENERAL:  The patient is generally pleasant, alert and oriented x3.  He  is sitting on edge of bed in a flexed posture.  HEENT:  Pupils equally round and reactive to light and accommodation.  Extraocular eye movements were grossly intact.  Ear, nose and throat  exam was unremarkable with fair dentition and pink oral mucosa, which  was moist.  NECK:  Supple without JVD or lymphadenopathy.  CHEST:  Clear to auscultation bilaterally without wheezes, rales or  rhonchi.  He did have decreased air movement overall.  HEART:  Irregularly irregular with no murmur, rubs or gallops.  EXTREMITIES:  1+ edema in the lower extremities, trace to 1+ in the  upper extremities distally.  ABDOMEN:  Soft, nontender.  Bowel sounds are positive.  SKIN:  Notable for tear in the upper gluteal fold region near the   sacrum.  He has excoriated area approximately 5 inches in diameter  around the skin tear.  The patient's wound is clean, dry intact with  Steri-Strips.  EXTREMITIES:  Notable for multiple bruises and ecchymoses.  NEUROLOGIC:  Cranial nerves II-XII are intact.  The reflexes are 1+.  Sensation is grossly intact to pinprick and light touch.  The patient  had minimal dysesthesias by his report today.  Motor function in the  lower extremities was 4/5.  Upper extremity strength 4 to4+/5.  Judgment, orientation, memory and mood were all within normal limits  today.   ASSESSMENT AND PLAN:  1. Functional deficits secondary to ongoing lumbar stenosis and      spondylosis with spondylolisthesis.  The patient is status post      lumbar laminectomy and facetectomy at L4-5, L5-S1 on June 13.      Begin comprehensive inpatient rehab with PT to assess and treat for      range of motion, strengthening, bed mobility, transfers and gait.      OT will assess for range of motion, strengthening donning and      doffing of brace and equipment.  Rehab nurse will follow for bowel,      bladder and skin care and pain medication issues.  Rehab case      management/social worker will assess for psychosocial needs and      discharge planning.  Estimated length of stay 7-10 days.  Prognosis      good.  Goals:  Modified independent.  2. Deep vein thrombosis prophylaxis with subcutaneous heparin/with      Coumadin.  3. Atrial fibrillation/ventricular tachycardia:  Resume Coumadin.      Will dose per pharmacy initially.  4. Hyperlipidemia:  Zocor.  5. Hypertension:  Continue Lanoxin, Lasix, Norvasc, amiodarone, nitro      patch, Coreg and Avapro.  6. Urinary retention:  Begin voiding trial today.  Flomax on board.      Check PVRs.  7. Chronic obstructive pulmonary disease:  Continue nebulizers and      prednisone for now.  The patient appears comfortable.  8. Anemia:  Check CBC Monday morning.  Continue iron  supplementation.  9. Pain control:  Continue oxycodone and Tylenol.  The patient will      also receive Robaxin p.r.n. for breakthrough spasm.      Ranelle Oyster, M.D.  Electronically Signed     ZTS/MEDQ  D:  12/22/2006  T:  12/22/2006  Job:  [id number]

## 2010-11-15 NOTE — Consult Note (Signed)
NAMEBANKS, CHAIKIN NO.:  [id number]   MEDICAL RECORD NO.:  [id number]          PATIENT TYPE:  INP   LOCATION:                               FACILITY:  [facility]   PHYSICIAN:  Hillis Range, MD       DATE OF BIRTH:  07-24-1935   DATE OF CONSULTATION:  DATE OF DISCHARGE:                                 CONSULTATION   REQUESTING PHYSICIAN:  Lyn Records, MD   REASON FOR CONSULTATION:  Ventricular tachycardia.   HISTORY OF PRESENT ILLNESS:  [name] is a pleasant 75 year old  gentleman with multiple comorbidities including coronary artery disease,  ischemic cardiomyopathy (ejection fraction 15%), New [place] Heart  Association class III heart failure, prior syncope, status post  biventricular ICD implantation, peripheral vascular disease,  cerebrovascular disease, and persistent atrial fibrillation, who was  admitted with symptomatic ventricular tachycardia.  The patient reports  being at home yesterday afternoon when he developed a cough.  He then  immediately developed heart racing with associated diaphoresis, chest  discomfort, and shortness of breath.  He did not receive an ICD shock.   On further discussion with the patient, he reports increasing weight  gain, lower extremity edema, and shortness of breath for several weeks.  He continues to take his Lasix daily.  He reports compliance with all of  his medications.  Interrogation of his ICD suggests that his OptiVol  threshold was recently crossed likely secondary to worsening heart  failure.  He is otherwise without complaint at this time.  He has had no  further chest pain since his ventricular tachycardia resolved.   PAST MEDICAL HISTORY:  1. Coronary artery disease status post CABG in 1998.  2. Ischemic cardiomyopathy (ejection fraction 15%).  3. New York Heart Association class III heart failure.  4. History of ventricular tachycardia.  5. Status post biventricular ICD implantation.  6. COPD.  7.  Persistent atrial fibrillation.  8. Hypertension.  9. Cerebrovascular disease.  10.Peripheral vascular disease.   MEDICATIONS:  1. Amiodarone 400 mg daily.  2. Norvasc 5 mg daily.  3. Aspirin 81 mg daily.  4. Coreg 12.5 mg b.i.d.  5. Digoxin 0.125 mg daily.  6. Lasix 40 mg IV q.12 hours.  7. Benicar 40 mg daily.  8. Prednisone 10 mg daily.  9. Potassium chloride 40 mEq daily.  10.Simvastatin 40 mg nightly.  11.Albuterol q.i.d. p.r.n.  12.Sublingual nitroglycerin p.r.n.   ALLERGIES:  No known drug allergies.   SOCIAL HISTORY:  The patient lives in [place] and is retired.  He has  a history of tobacco, but quit in 2003.  He denies alcohol or drug use.   FAMILY HISTORY:  Notable for Alzheimer disease and congestive heart  failure.   REVIEW OF SYSTEMS:  All systems are reviewed and negative except as  outlined in the HPI above.   PHYSICAL EXAMINATION:  Telemetry reveals an atrially and ventricular  paced rhythms.  VITALS:  Blood pressure 127/48, heart rate 60, respirations 17, sats 98%  on 2 L, afebrile.  GENERAL:  The patient is a chronically ill-appearing male in  no acute  distress.  He is alert and oriented x3.  HEENT:  Normocephalic, atraumatic.  Sclerae clear.  Conjunctivae pink.  Oropharynx clear.  NECK:  Supple.  JVP 11 cm.  LUNGS:  Bibasilar rales.  Normal work of breathing.  HEART:  Regular rate and rhythm.  No murmurs, rubs, or gallops.  GI:  Soft, nontender, nondistended.  Positive bowel sounds.  EXTREMITIES:  No clubbing or cyanosis.  Lower extremity edema 1+,  pitting bilaterally.  SKIN:  No ecchymosis or laceration.  MUSCULOSKELETAL:  No deformity or atrophy.  PSYCHIATRIC:  Euthymic mood.  Full affect.   EKG reveals atrial and ventricular pacing.   DEVICE INTERROGATION:  Interrogation of the patient's biventricular ICD  reveals normal device function.  He had 2 separate episodes of  ventricular tachycardia on September 29, 2008.  The first episode lasted  4  minutes and 27 seconds with an average ventricular rate of 146 beats per  minute which was below the detection rate of the device.  The second  episode was on September 29, 2008, at 12:23 a.m. and lasted 31 minutes and  41 seconds with an average ventricular rate of 147 beats per minute.  The device was reprogrammed today to a VT zone at 140 beats per minute  with a VF zone at 176 beats per minute with fast VT via the VF zone  between 176 and 200 beats per minute.  The VT therapies are burst ATP  x3, ramp ATP x2, 30 joules, 35 joules x3.  The past VT therapies are  burst ATP x3, 35 joules x5.  The VF therapies are 25 joules, 35 joules  x5 with ATP during charging.  The device is reprogrammed to determine  rate response on today as he is frequently paced in the atrium.  The  patient is also observed to have crossed his OptiVol threshold over the  past few weeks.   LABORATORY DATA:  CK 71, CK-MB 3.7, troponin 0.01, magnesium 2.3,  creatinine 0.97, potassium 3.6.  BNP 191.  INR 2.3.   IMPRESSION:  [name] is a pleasant 75 year old gentleman who was  admitted with symptomatic ventricular tachycardia.  His ventricular  tachycardia was below the programmed detection zone of his implantable  cardioverter-defibrillator and he therefore did not receive therapies  for his ventricular tachycardia.  He is chronically treated with  amiodarone 400 mg daily for suppression of ventricular tachycardia.  He  also appears to have had worsening heart failure over the past few weeks  and has crossed his OptiVol threshold.  I suspect that progressive acute  on chronic systolic dysfunction is the cause for his ventricular  tachycardia.  I would therefore recommend aggressive medical therapies  for his congestive heart failure.  I have continued the patient on his  home regimen of the amiodarone 400 mg daily and will obtain an  amiodarone level.  We will also obtain a thyroid profile at this time to  rule  out hyperthyroidism as a cause for ventricular tachycardia.  The  patient's device was reprogrammed to detect ventricular tachycardias  above 140 beats per minute with antitachycardia pacing therapies  programmed on.  I think that the patient has a progressive ischemic  cardiomyopathy, with ventricular tachycardia and multiple comorbidities.  I think that his overall prognosis is quite guarded.  We will continue  medical therapies at this time.   PLAN:  1. Amiodarone 400 mg daily.  2. The patient's biventricular ICD is reprogrammed as above.  3. Amiodarone level, TFTs.  4. We will increase Coreg to 25 mg b.i.d. and turn rate response on on      his biventricular ICD.  5. Treatment of heart failure is deferred to the primary team.       Hillis Range, MD  Electronically Signed     JA/MEDQ  D:  09/29/2008  T:  09/30/2008  Job:  [id number]   cc:   Lyn Records, M.D.

## 2010-11-15 NOTE — Procedures (Signed)
AORTA-ILIAC DUPLEX EVALUATION   INDICATION:  Followup left common iliac artery stent.  [name]  states no recurrence of left lower extremity claudication as had before  surgery.   HISTORY:  Diabetes:  No.  Cardiac:  CHF.  Hypertension:  Yes.  Smoking:  Quit.  Previous Surgery:  Left common iliac artery stent 04/16/2008 by Dr.  Myra Gianotti.               SINGLE LEVEL ARTERIAL EXAM                              RIGHT                  LEFT  Brachial:                  147                    149  Anterior tibial:           112                    79  Posterior tibial:          105                    71  Peroneal:  Ankle/brachial index:      0.75                   0.53  Previous ABI/date:         05/04/2008 0.75        05/04/2008 0.57   AORTA-ILIAC DUPLEX EXAM  Aorta - Proximal     100 cm/s  Aorta - Mid          101 cm/s  Aorta - Distal       112 cm/s   RIGHT                                   LEFT  227 cm/s          CIA-PROXIMAL          245 cm/s  156 cm/s          CIA-DISTAL            248 cm/s  229 cm/s          HYPOGASTRIC           220 cm/s  233 cm/s          EIA-PROXIMAL          168 cm/s  166 cm/s          EIA-MID               143 cm/s  174 cm/s          EIA-DISTAL            155 cm/s   IMPRESSION:  1. Bilateral ABIs appear stable from previous study.  2. Patent aorta and iliac arteries bilaterally.  3. Mildly elevated iliac velocities bilaterally.  4. Technically limited study due to bowel gas and body habitus.   ___________________________________________  V. Charlena Cross, MD   AS/MEDQ  D:  10/23/2008  T:  10/23/2008  Job:  [id number]

## 2010-11-15 NOTE — Assessment & Plan Note (Signed)
Augmentin 875mg  Twice daily  For 7 days take w/ food Mucinex DM Twice daily  As needed  Cough/congestion  Fluids and rest  Increase Prednisone 10mg  4 tabs for 4 days, then 3 tabs for 4 days, 2 tabs for 4 days, then back to 1 tab daily  follow up Dr. Shelle Iron as planned and As needed   Please contact office for sooner follow up if symptoms do not improve or worsen or seek emergency care

## 2010-11-15 NOTE — H&P (Signed)
[name], [name]               ACCOUNT NO.:  [id number]   MEDICAL RECORD NO.:  [id number]          PATIENT TYPE:  AMB   LOCATION:  SDS                          FACILITY:  [facility]   PHYSICIAN:  Hilda Lias, M.D.   DATE OF BIRTH:  1935/07/24   DATE OF ADMISSION:  12/14/2006  DATE OF DISCHARGE:                              HISTORY & PHYSICAL   HISTORY OF PRESENT ILLNESS:  The patient comes to my office complaining  of back pain with radiation to both legs, gets worse when he tried to  get up.  Improved slightly when he walks, and also when he lies in bed.  The pain goes to both legs, associated with cramps.  This gentleman was  seen by me in the past, and since then he has cancer of the colon  removed, and also open heart surgery.  Twenty years ago, had surgery by  an orthopedic surgeon.  He is quite miserable.  He came to my office and  he said that something needed to be done because he cannot live with the  pain.   PAST MEDICAL HISTORY:  Lumbar surgery in 1985 and 1999.  He had colon  cancer.   ALLERGIES:  No medications.   SOCIAL HISTORY:  Negative.   FAMILY HISTORY:  Unremarkable.   REVIEW OF SYSTEMS:  Positive for back pain, high blood pressure, history  of cancer of the colon.   PHYSICAL EXAMINATION:  The patient came to my office.  He is walking  with a short step.  HEENT:  Normal.  NECK:  Normal.  LUNGS:  Clear.  Has probably some rhonchi bilaterally.  Has a scar on  the midline of the chest.  ABDOMEN:  There is scar also from previous surgery.  EXTREMITIES:  Pulses in the left foot.  None on the right side.  NEUROLOGIC:  He has complained of tingling sensation to both legs, he  has a weakness of dorsi and plantarflexion in both feet.  Straight leg  raising is positive at 30 degrees.   The x-rays showed that he has spondylolisthesis at the level of L4-5  with stenosis and degenerative disk disease at the level of 5-1.   IMPRESSION:  Degenerative disc disease  with lumbar spondylolisthesis at  4-5, 5-1.   RECOMMENDATIONS:  The patient is being admitted for surgery.  The  procedure will be bilateral 4-5, 5-1 discectomy interbody fusion with  cages and pedicle screws.  He knows about the risks which include  infection, CSF leak, worsening pain, paralysis, need for further surgery  and all the risks associated with his history of heart disease.           ______________________________  Hilda Lias, M.D.     EB/MEDQ  D:  12/14/2006  T:  12/14/2006  Job:  [id number]

## 2010-11-18 NOTE — Consult Note (Signed)
[name], [name] NO.:  [id number]   MEDICAL RECORD NO.:  [id number]          PATIENT TYPE:  INP   LOCATION:  2115                         FACILITY:  [facility]   PHYSICIAN:  Bernette Redbird, M.D.   DATE OF BIRTH:  Oct 03, 1935   DATE OF CONSULTATION:  02/08/2005  DATE OF DISCHARGE:                                   CONSULTATION   Dr. Delford Field asked [place] to see this 75 year old gentleman because of an apparent  small bowel obstruction.   [name] was admitted to the hospital two days ago in respiratory  failure with an exacerbation of COPD and pneumonia and has progressed to the  point of intubation.  He has a history of severe ischemic cardiomyopathy  with a reported ejection fraction of 15%.  He is on home oxygen, has a  history of atrial flutter on chronic anticoagulation with Coumadin, and I  believe also has an implantable defibrillator.   Apparently, there has been some nausea and vomiting at some point and  gastric decompression has been accomplished since admission which has  returned large amounts of dark fluid.  An abdominal plane film has shown  dilated loops of small bowel in the left upper quadrant region consistent  with either a partial obstruction or an ileus.  In that regard, the patient  has had previous surgery including what appears on his abdomen to be an open  cholecystectomy in the past plus a laparoscopically assisted left  hemicolectomy for colon cancer about a year ago.   PAST MEDICAL HISTORY:   OUTPATIENT MEDICATIONS:  Coumadin, aspirin, Pulmicort, nitroglycerin patch,  Spiriva, Xopenex, Digitek, Benicar, furosemide, Coreg, Zocor.  Apparently he  is not maintained on PPI prophylaxis at home.   ALLERGIES:  No known allergies.   OPERATIONS:  1.  Probable previous open cholecystectomy based on examination.  2.  Status post CABG.  3.  Left hemicolectomy laparoscopically assisted about a year ago.   MEDICAL ILLNESSES:  1.  CAD.  2.   Ischemic cardiomyopathy.  3.  History of wide complex tachycardia.  4.  SVT.  5.  Atrial fibrillation/flutter.  6.  COPD on home O2.  7.  Hyperlipidemia.   HABITS:  From review of the hospital records it appears that the patient was  previously a heavy smoker, but stopped about a year ago.   FAMILY HISTORY:  Not obtained, probably not relevant to current  consultation.   SOCIAL HISTORY:  Not obtained.   REVIEW OF SYSTEMS:  Unobtainable.   PHYSICAL EXAMINATION:  GENERAL:  The patient is sedated on the ventilator.  He is without frank pallor or icterus.  CHEST:  Basically clear with perhaps a few soft wheezes.  The heart has a  hyperdynamic precordium but I did not hear any murmurs.  ABDOMEN:  Quiet without organomegaly, guarding, mass, or tenderness evident.  RECTAL:  Not performed.  The patient has a Panda tube in place and there is  a large amount of dark, thick brown fluid in the canister.   LABORATORIES:  Admission hemoglobin 14.2, 13.2 following hydration.  Platelets normal at  209,000, white count 15,000.  INR elevated at 4.5.  BUN  has risen to 57 with creatinine 1.9 which are elevated compared to  yesterday.  Liver chemistries normal on admission.   IMPRESSION:  1.  Probable small bowel obstruction based on radiographic appearance of the      small bowel plus large amount of Panda tube output.  2.  Ventilator-dependent respiratory failure with probable associated      pneumonia.  3.  Over anticoagulation.   DISCUSSION AND PLAN:  There is little to do from the GI perspective apart  from supportive care at this time.  I certainly favor continued use of high  dose IV PPI therapy and continued gastric decompression.  Consideration  might be given to partial reversal of the patient's over anticoagulation or  at least close monitoring of his pro time.  Dr. Reece Agar knows the  patient from previous colonoscopic evaluation and will follow him  periodically.  Further  management, testing, and intervention from the GI  perspective would depend on his clinical evolution.       RB/MEDQ  D:  02/08/2005  T:  02/09/2005  Job:  [id number]   cc:   Theressa Millard, M.D.  [address] [place]  [place] [address]  Fax: [phone]   Shan Levans, M.D. [facility]

## 2010-11-18 NOTE — Op Note (Signed)
NAME:  [name], [name]                         ACCOUNT NO.:  [id number]   MEDICAL RECORD NO.:  [id number]                   PATIENT TYPE:  INP   LOCATION:  4713                                 FACILITY:  [facility]   PHYSICIAN:  Thornton Park. Daphine Deutscher, M.D.             DATE OF BIRTH:  September 27, 1935   DATE OF PROCEDURE:  03/09/2004  DATE OF DISCHARGE:                                 OPERATIVE REPORT   PREOPERATIVE DIAGNOSIS:  Carcinoma of the left colon at approximately 50 cm  from the anal verge.   POSTOPERATIVE DIAGNOSIS:  Carcinoma at the splenic flexure.   PROCEDURE:  Left hemicolectomy with laparoscopically assisted mobilization  of splenic flexure.   SURGEON:  Thornton Park. Daphine Deutscher, M.D.   ASSISTANT:  Ollen Gross. Carolynne Edouard, M.D.   ANESTHESIA:  General anesthesia.   DESCRIPTION OF PROCEDURE:  [name] is a 75 year old gentleman with  the above mentioned cancer in his colon.  He came in the hospital one night  preoperatively and underwent a bowel prep.  He was taken to room 17, given  general anesthesia.  The abdomen was prepped with Betadine and draped  sterilely.  Preoperatively, he received Mefoxin.  Using the Optiview  technique, three trocars were introduced into the right side of the abdomen  and through these, using the Glassman's and the harmonic scalpel, the left  colon was mobilized from the sigmoid colon all the way up to the splenic  flexure.  The tattooed area was found in the splenic flexure and it was  uncovered and freed  up using the harmonic scalpel and this was mobilized  pretty much toward the midline.  I then connected two of my port sites  around the umbilicus and inserted the applied medical gel port and then used  that to actually palpate the lesion and further extend my mobilization to  the midline.  When this was complete, I went ahead and removed the gel port  and exteriorized the bowel.  I found a good proximal vessel that I used as a  marker for the proximal  margin and divided the bowel with the GIA.  This  left a greater than 6 to 8 cm margin proximally.  I went through the  mesentery with the harmonic scalpel and then distally I mobilized the  sigmoid, divided the sigmoid with the GIA and again came to the mesentery  with the harmonic scalpel.  Large vessels were clamped and tied with 2-0  silk suture ligatures.  Specimen was sent to pathology for examination.   Next, an end-to-end anastomosis was created using an outer layer of 3-0 silk  Lembert sutures and an inner layer of 4-0 PDS in a running fashion.  An end-  to-end anastomosis was created with two layers.  I was able to palpate a  ring over the anastomosis at the completion indicating it was good and  patent.  Since the mesenteric  defect was so broad, there was no way to close  this and the small-bowel was free to move around and beneath this.  The area  was irrigated with saline.  Sponge and needle counts were reported as  correct. I palpated everything.  It seemed to be in  order.  The small incision in the midline was then closed with #1 PDS from  below and above tying in the middle.  The wound was irrigated and all skin  of the incision as well as two trocar sites were closed with staples.  The  patient seemed to tolerate the procedure well and was taken to the recovery  room in satisfactory condition.                                               Thornton Park Daphine Deutscher, M.D.    MBM/MEDQ  D:  03/09/2004  T:  03/09/2004  Job:  [id number]   cc:   Theressa Millard, M.D.  [address] [place]  [place] [address]  Fax: [phone]   Lyn Records III, M.D.  [address]  [place]  [place] [address]  Fax: [phone]   Danise Edge, M.D.  [address]  [place]  [place] [address]  Fax: [phone]

## 2010-11-18 NOTE — Assessment & Plan Note (Signed)
[facility]                                 ON-CALL NOTE   Bradley, Robert                        MRN:          [id number]  DATE:09/01/2006                            DOB:          04-Jan-1936    PROBLEM:  [name] is a patient of Dr. Marcelyn Bruins.  From what I  can gather, he has COPD with recurrent bronchial infections.  He was  seen by Rubye Oaks about 7 to 10 days ago and was prescribed Avelox  for a five-day course.  This seemed to help.  However, on the morning of  this phone call, he had increased chest congestion with thick mucus.  He  is chronically on steroids at 5 mg per day.  He has nebulized  bronchodilators q.i.d.  He did not sound excessively dyspneic over the  phone, able to finish full sentences without difficulty.   PLAN:  I explained to [name] that I would prefer to avoid further  antibiotics at this time if possible.  I gave him permission to take a  couple of extra treatments and nebulized bronchodilators, with the  thinking that this would improve his airway hygiene.  He is to call  again on the day following this telephone encounter, if his symptoms  have not improved significantly.  Alternatively, he can be seen in the  office in followup for further evaluation.     Oley Balm Sung Amabile, MD  Electronically Signed    DBS/MedQ  DD: 09/01/2006  DT: 09/01/2006  Job #: [id number]   cc:   Barbaraann Share, MD,FCCP

## 2010-11-18 NOTE — Discharge Summary (Signed)
[name], [name] NO.:  [id number]   MEDICAL RECORD NO.:  [id number]          PATIENT TYPE:  INP   LOCATION:  2011                         FACILITY:  [facility]   PHYSICIAN:  Lyn Records, M.D.   DATE OF BIRTH:  09/02/1935   DATE OF ADMISSION:  09/10/2004  DATE OF DISCHARGE:  09/15/2004                                 DISCHARGE SUMMARY   CONSULTANTS:  1.  Meade Maw, M.D., with cardiology.  2.  Janeece Riggers. Severiano Gilbert, M.D., with electrophysiology study.   PRIMARY CARE PHYSICIAN:  Theressa Millard, M.D.   CHIEF COMPLAINT AND REASON FOR ADMISSION:  Mr. Sturdevant is a 75 year old  male patient who presented to the ER on the date of admission with  complaints of perfuse diaphoresis.  This lasted 15-20 minutes and was  associated with shortness of breath.  He was transferred to an [facility] and at that time EKG revealed supraventricular tachycardia, rate of  150 beats per minute.  This patient has a known history of CAD, status post  CABG in 1998 with an EF of 20-30%.  He also has severe COPD and  dyslipidemia.  He was admitted by the internal medicine service with a  diagnosis of atrial flutter versus supraventricular tachycardia and known  ischemic cardiomyopathy with presumed congestive heart failure exacerbation.  Cardiology services were consulted on the date of admission due to  complaints of dyspnea and diaphoresis, which could have been anginal  equivalent.  Again, Dr. Fraser Din evaluated the patient and felt that the  diaphoresis may be an anginal equivalent.  The patient also has a history of  pacemaker in place.  With review of the EKGs, Dr. Fraser Din felt that his  supraventricular tachycardia was atrial sensed and ventricular paced and was  being treated appropriately with IV Cardizem.  She also felt that because of  the ischemic cardiomyopathy with the ejection fraction 15-20% that the  patient needed to be considered for an AICD implant/upgrade.  Dr.  Katrinka Blazing is  the primary cardiologist and she referred this to him when he reassumed care  of the patient after the weekend.   HOSPITAL COURSE:  #1 - ISCHEMIC CARDIOMYOPATHY WITH CONGESTIVE HEART FAILURE  EXACERBATION:  The patient was admitted to the telemetry unit and started on  routine IV heparin, continued on his home medications and started on IV  Lasix.  There was also felt to be a degree of either COPD exacerbation or  pneumonia equivalent and this was managed per the internal medicine  services.  By the following day, the patient had no further diaphoresis, but  had increased shortness of breath during the night.  BNP was mildly elevated  at 250.  He was continued on nebulizer treatments.  He also continued to  experience problems with supraventricular tachycardia, heart rate 80-110.   Mr. Katrinka Blazing resumed care on September 12, 2004, and based on the patient's  symptoms and history he felt that a catheterization was needed to document  graft patency.  He underwent catheterization on the same day which showed  progressive ischemic cardiomyopathy, EF now  less than or equal to 15%,  severe left main, LAD and circumflex disease with a patent RCA, patent SVG  and LIMA graft.  His medical recommendations were to consider ablation if  recurrent SVT and probably upgrade to a biventricular AICD.   On the same date, electrophysiology was consulted in regards to their  recommendation about upgrading to a biventricular ICD.  The patient  underwent biventricular pacemaker upgrade to an ICD on September 13, 2004, per  Dr. Severiano Gilbert.  Please refer to the operative note for further details.  The  pacemaker device was checked the following morning with 99% A sensed, V  paced and no episodes of defibrillator discharge.  Chest x-ray showed no  pneumothorax and acceptable lead position.  The only complaint the patient  had that day was of left-side increased swelling, especially of the leg.  He  was examined and no  significant edema was noted, just some soft left upper  extremity swelling and 1+ edema in the left leg.  From a cardiac standpoint,  the patient was deemed appropriate for discharge home.   #2 - CHRONIC OBSTRUCTIVE PULMONARY DISEASE EXACERBATION AND ACUTE  BRONCHITIS:  As mentioned in the CHF narrative, there was some degree of  suspected infective etiology to the patient's shortness of breath and  internal medicine felt that the patient had superimposed acute bronchitis.  He had been started on Avelox and prednisone therapy and did quite well.  On  the day of September 12, 2004, there were plans to sent the patient home, but  with oxygen off and patient ambulating, his saturation dropped to 85%.  He  reported that he felt terrible and was coughing up thick mucus.  Because he  was going to be at home alone that evening, he asked to stay in the hospital  an extra night.  Oxygen was continued and Guiatuss as an expectorant was  added.  We also gave an extra dose of Lasix IV since the patient could be  experiencing mild heart failure after receiving IV fluids for the AICD  upgrade.  It was also noted that the patient had been on a stable dose of  steroids since these were initiated and his steroid taper was initiated on  September 15, 2004.   On September 15, 2004, the patient's O2 had been weaned from 4 L own to room air  with a resting saturation of 92%.  Because of previous issues of fever upon  admission, blood cultures have been obtained in ER and one bottle was  positive for gram-positive cocci in clusters.  I spoke with Dr. Earl Gala and  he will follow up this up in the outpatient setting after antibiotic therapy  has been completed.   #3 - ATRIAL FLUTTER ON CHRONIC COUMADIN:  The patient remained in a regular  pulse with atrial flutter and ventricular pacing.  The INR was 1.1 on the  day of discharge.  Since the patient is a limited surgical procedure, Lovenox is contraindicated and the  patient was sent home on Coumadin loading  without additional anticoagulation.   FINAL DISCHARGE DIAGNOSES:  1.  Shortness of breath and dyspnea secondary to ischemic cardiomyopathy and      congestive heart failure exacerbation.  2.  Progressive ischemic cardiomyopathy with ejection fraction less than or      equal to 15%, status post biventricular upgrade to automatic implantable      cardioverter/defibrillator.  3.  History of wide complex tachycardia and supraventricular tachycardia.  4.  History of atrial fibrillation on chronic Coumadin.  INR subtherapeutic      at discharge.  5.  History of fever and one set of blood cultures positive to be followed      up in the outpatient setting per Dr. Earl Gala.  6.  Chronic obstructive pulmonary disease exacerbation and acute bronchitis.      Completing antibiotic therapy and steroid taper.   DISCHARGE MEDICATIONS:  1.  Coumadin 5 mg tablets, two tablets on Thursday and then 5 mg daily.  2.  Aspirin 325 mg daily.  3.  Zocor 20 mg daily.  4.  Coreg 6.25 mg b.i.d.  5.  Spiriva inhaler as previous.  6.  Digoxin 0.125 mg daily.  7.  Lasix 40 mg daily.  8.  Resume nitroglycerin patches 0.2 mg/hr on the morning and off in the      afternoon.  9.  Xopenex 1.25 mg q.8h. x 10 days and then three times a day if needed for      wheezes.  10. Avelox 400 mg daily until September 22, 2004.  11. Prednisone taper 20 mg tablets.  Follow directions on bottle.  12. Guiatuss 600 mg b.i.d.  13. Budesonide nebulizers b.i.d.   ACTIVITY:  As tolerated.   DIET:  Heart healthy.   WOUND CARE:  Keep area at pacemaker site clean, dry and intact.  Shower only  for the next three days.   ADDITIONAL FOLLOWUP:  He needs to follow up with Dr. Newell Coral office in two  weeks to have repeat blood cultures drawn.   HOME HEALTH INSTRUCTIONS:  O2 via nasal cannula at 2 L/min per home health.   FOLLOWUP APPOINTMENTS:  He is to see Loraine Leriche E. Severiano Gilbert, M.D., on September 21, 2004,  at 11:30 a.m.  He is to see Dr. Katrinka Blazing on Friday, October 07, 2004, at 10:15  a.m.  He is to return to the Coumadin clinic on Friday, September 16, 2004, at  12:15 p.m.       ALE/MEDQ  D:  11/16/2004  T:  11/16/2004  Job:  [id number]   cc:   Theressa Millard, M.D.  [address] [place]  [place] [address]  Fax: [phone]   Janeece Riggers. Severiano Gilbert, M.D.  Fax: [phone]

## 2010-11-18 NOTE — Discharge Summary (Signed)
NAMEDOUGLES, KIMMEY NO.:  [id number]   MEDICAL RECORD NO.:  [id number]          PATIENT TYPE:  INP   LOCATION:  3712                         FACILITY:  [facility]   PHYSICIAN:  Theressa Millard, M.D.    DATE OF BIRTH:  April 20, 1936   DATE OF ADMISSION:  07/20/2004  DATE OF DISCHARGE:  07/22/2004                                 DISCHARGE SUMMARY   ADMISSION DIAGNOSIS:  Chronic pulmonary obstructive disease exacerbation.   DISCHARGE DIAGNOSES:  1.  Chronic pulmonary obstructive disease exacerbation.  2.  Mild component of congestive heart failure.  3.  Severe chronic pulmonary obstructive disease.  4.  Ischemic cardiomyopathy.  5.  Coronary artery disease.  6.  Colon cancer, status post recent resection.   HOSPITAL COURSE:  The patient is a 75 year old white male who awoke short of  breath. ENTs were called.  The patient was brought to the emergency department and initially appeared  to be in acute respiratory failure and required BiPAP for an hour or 2.  However, after treatment with Lasix and albuterol he rapidly responded and  was able to be taken of BiPAP.  He was admitted to the floor for further  observation and evaluation.  His initial evaluation included a chest x-ray  and laboratory data with somewhat conflicting results. His BNP was mildly  elevated but there was absolutely no evidence of pulmonary fluid on chest x-  ray.  In addition the patient began to cough up some greenish material and  it was thought the most likely etiology of this was actually a COPD  exacerbation.  They may have been a mild component of CHF.  He had been seen  recently by Dr. Shelle Iron and Sherrye Payor had been added but he had only been on it  for the last 2 days.  He was seen in consultation by pulmonary who did add  nitroglycerin patch in case there was a cardiac component of this but  otherwise we continued his antibiotics, steroids and inhalers. He improved  rapidly and was discharged in  improved condition.   DISCHARGE MEDICATIONS:  1.  Coreg 6.25 mg b.i.d.  2.  Coumadin - none for the next 3 days then 5 mg daily.  3.  Lanoxin 0.125 mg daily.  4.  Zocor 20 mg daily.  5.  Nitroglycerin p.r.n.  6.  Altace 10 mg b.i.d.  7.  Lasix 40 mg daily.  8.  Nitroglycerin patch 0.2 mg/Hr. Apply every morning and remove at h.s.  9.  Levaquin 500 mg daily x5 days.  10. Spiriva 1 inhalation daily.  11. Albuterol HFA 2 puffs q.i.d. p.r.n.  12. Prednisone 40 mg x7 days and then 20 mg x7 days.   FOLLOW UP:  He will call to make an appointment for a protime next Tuesday.  Will see him in our office in a month or so.      JO/MEDQ  D:  07/22/2004  T:  07/22/2004  Job:  [id number]

## 2010-11-18 NOTE — Consult Note (Signed)
NAMEFINIS, HENDRICKSEN NO.:  [id number]   MEDICAL RECORD NO.:  [id number]          PATIENT TYPE:  INP   LOCATION:  2011                         FACILITY:  [facility]   PHYSICIAN:  Georgann Housekeeper, MD      DATE OF BIRTH:  Jun 23, 1936   DATE OF CONSULTATION:  09/11/2004  DATE OF DISCHARGE:                                   CONSULTATION   REFERRING PHYSICIAN:  Meade Maw, M.D.   HISTORY OF PRESENT ILLNESS:  The patient is a 75 year old male with the past  medical history of coronary artery disease and CABG in 1998, with congestive  heart failure with EF of 20-30%, history of COPD, moderate to severe TIA and  hyperlipidemia.  He presented to the emergency room with the episode of  diaphoresis and shortness of breath.  He was found to be in SVT.  The  patient was admitted by cardiology as far as consultation for his pulmonary  status.  The patient has been complaining of some coughing and congestion  going on for about a week  and he said he has been producing productive  green sputum.  He denies any fevers, complaining of some wheezing and  shortness of breath.  He has been on MDIs at home.  He does not have a  nebulizer.  He was admitted in January 2006, for COPD exacerbation.  He has  been followed by pulmonary and Dr. Shelle Iron.  He reportedly does not have a  nebulizer at home.  He denies any chest pain.  When I saw him, he felt  better after the nebulizer treatment.   PAST MEDICAL HISTORY:  As above.   PAST SURGICAL HISTORY:  1.  Back surgery.  2.  Gallbladder surgery.  3.  History of colon cancer with colectomy.   ALLERGIES:  No known drug allergies.   MEDICATIONS:  1.  Coreg 6.25 mg b.i.d.  2.  Coumadin.  3.  Zocor 20 mg daily.  4.  Lasix 40 mg daily.  5.  Spiriva inhaler once a day.  6.  Heparin.  7.  Doxycycline for questionable sinusitis.   PHYSICAL EXAMINATION:  VITAL SIGNS:  He had a temperature of 100 in the  presentation, blood pressure 109/58,  respirations 16, saturations 97% on 2  L, heart rate 80s.  LUNGS;  Decreased breath sounds with occasional wheezing.  CARDIAC:  S1, S2 without any murmurs.  ABDOMEN:  Soft.  EXTREMITIES:  No edema.   LABORATORY DATA AND X-RAY FINDINGS:  Chest x-ray showed bibasilar  atelectasis with mild peribronchial thickening.  No infiltrates.  His EKG  initially with SVT with the heart rate of 150s and has resolved.   IMPRESSION:  A 75 year old admitted with diaphoresis and supraventricular  tachycardia with mild cardiac enzymes positive.  As far his chronic  obstructive pulmonary disease, most likely chronic obstructive pulmonary  disease exacerbation with some bronchitis.   RECOMMENDATIONS:  1.  Recommend getting Avelox p.o. 400 mg daily.  2.  Discontinue the doxycycline, nebulizers, Xopenex and Atrovent.  3.  Solu-Medrol dose 80 mg once and then get the  prednisone p.o. 60 mg.  4.  He needs to have INR closely followed along with electrolytes while on      antibiotics.  5.  Will get a CBC again.  His white count was normal.  Current lab data      showed a significant white count of 8.6.  Electrolytes were normal.      Mild cardiac positive enzymes.      KH/MEDQ  D:  09/11/2004  T:  09/12/2004  Job:  [id number]   cc:   Meade Maw, M.D.  [address]  [place]  [place] [address]  Fax: [phone]

## 2010-11-18 NOTE — Assessment & Plan Note (Signed)
[facility]                             PULMONARY OFFICE NOTE   NAME:[name], [name]                      MRN:          [id number]  DATE:09/14/2006                            DOB:          [birth year]    HISTORY OF PRESENT ILLNESS:  Patient is a 75 year old white male patient  of Dr. Shelle Iron, who has a known history of emphysematous COPD and returns  today for a two-week followup.  Patient recently had a slow-to-resolve  COPD exacerbation and required an extended course of antibiotics.  Initially, he was treated with Avalox for five days and then was  recently started on a seven-day course of Levaquin 750.  Patient returns  today, reporting that he is much-improved, in fact the best he has been  in a long time.  He has been at pulmonary rehab and has walked a mile  each exercise this week.  Patient also is only using his oxygen as  needed and has not required it over the last several days.   Patient has brought all of his medications in today for review, which  are correct with our medication list.   Patient denies any chest pain, orthopnea, PND or leg-swelling.   PAST MEDICAL HISTORY:  Reviewed.   CURRENT MEDICATIONS:  Reviewed.   PHYSICAL EXAM:  Patient is an elderly male, in no acute distress.  He is  afebrile with stable vital signs.  His O2 saturation is 92% on room air.  HEENT:  Unremarkable.  NECK:  Supple without cervical adenopathy.  No JVD.  LUNG SOUNDS:  Reveal diminished breath sounds in the bases, otherwise  clear.  CARDIAC:  Regular rate.  ABDOMEN:  Soft and nontender.  EXTREMITIES:  Warm without any edema.   IMPRESSION AND PLAN:  1. Recent COPD exacerbation, now resolved with prolonged antibiotic      course.  Patient will continue on his present regimen, follow back      up with Dr. Shelle Iron in six weeks, or so.  2. Complex medication regimen.  The patient's medications were      reviewed in detail, patient      education  provided.  A computerized medication calendar was      completed for this patient and reviewed in detail.  Patient      education was provided.      Rubye Oaks, NP  Electronically Signed      Barbaraann Share, MD,FCCP  Electronically Signed   TP/MedQ  DD: 09/14/2006  DT: 09/15/2006  Job #: [id number]

## 2010-11-18 NOTE — Consult Note (Signed)
[name], [name]               ACCOUNT NO.:  [id number]   MEDICAL RECORD NO.:  [id number]          PATIENT TYPE:  INP   LOCATION:  2011                         FACILITY:  [facility]   PHYSICIAN:  Janeece Riggers. Severiano Gilbert, M.D.    DATE OF BIRTH:  29-Mar-1936   DATE OF CONSULTATION:  09/12/2004  DATE OF DISCHARGE:                                   CONSULTATION   SOURCE OF CONSULTATION:  Dr. [name]   REASON FOR CONSULTATION:  Supraventricular tachycardia versus ventricular  tachycardia in a patient with severe ischemic cardiomyopathy and congestive  heart failure.   HISTORY OF PRESENT ILLNESS:  A 75 year old gentleman who had been recently  discharged from the hospital for a COPD exacerbation who presented to [facility] with complaints of pronounced diaphoresis and shortness of breath.  He denies much except for mild lightheadedness.  No syncope.  No angina  pectoris.  He was seen at [facility], was noted to be in a tachycardia of  approximately 150-160 beats per minute.  Blood pressure was stable.  He was  transferred to [facility].  Initial electrocardiograms demonstrated  a wide complex tachycardia probably most consistent with flutter of 2:1  block, although cannot exclude sustained monomorphic ventricular  tachycardia.  By the time he was seen in the emergency room his heart rate  had slowed to approximately 100-120 beats per minute.  Patient was initiated  on Cardizem IV drip for rate control under the presumption that perhaps the  rhythm was atrial fibrillation versus atrial flutter.  He was admitted to  the hospital.  Serial electrocardiograms were performed.  Telemetry  monitoring was initiated.  Coumadin therapy was held in preparation for  diagnostic catheterization.  Patient had no other further episodes of rapid  ventricular response atrial flutter and at some point his underlying rhythm  had converted to a sinus.  He was taken to diagnostic catheterization which  demonstrated three vessel coronary artery disease status post coronary  artery bypass graft surgery with patent grafts.  His ejection fraction was  less than 30%.   PAST MEDICAL HISTORY:  1.  Atherosclerotic coronary disease status post coronary artery bypass      graft in 1998.  2.  Ischemic cardiomyopathy, severe, ejection fraction less than 30%.  3.  Congestive heart failure class II.  4.  Atrial flutter as described in HPI.  5.  Severe chronic obstructive pulmonary disease which is limiting.  6.  Dyslipidemia.  7.  History of TIA.   PAST SURGICAL HISTORY:  1.  Bypass surgery.  2.  Colon cancer surgery.  3.  Lumbar surgery.   CURRENT MEDICATIONS:  1.  Heparin therapy.  2.  Aspirin 325 daily.  3.  Zocor 20 daily.  4.  Carvedilol 6.25 daily.  5.  Furosemide 40 mg daily.  6.  Xopenex inhalers.  7.  Avelox 400 daily.  8.  Prednisone 60 daily.   SOCIAL HISTORY:  Recently stopped smoking.   FAMILY HISTORY:  Positive for atherosclerotic coronary disease,  atherosclerotic cerebrovascular disease.   ALLERGIES:  No known drug allergies.  REVIEW OF SYSTEMS:  Stable weight.  No fevers, sweats, or chills.  He does  have chronic arthritic complaints, chronic dyspnea on exertion and shortness  of breath, COPD.  Does not really have angina pectoris.  Does not really  complain about palpitations, mild lightheadedness with the episode in HPI of  diaphoresis and rapid heart rate.  GI:  Negative.  GU:  Negative.  ENDO:  He  is not diabetic.  Has no symptoms of thyroid disease.  SKIN:  No easy  bruisability.  NEUROLOGIC:  No recent syncope.  HEENT:  Negative.  PSYCH:  He is neither depressed nor manic.   PHYSICAL EXAMINATION:  GENERAL:  Well-developed, well-nourished white male  in no acute distress.  Elderly-appearing.  VITAL SIGNS:  Temperature 96.8, heart rate 84, respirations 20, blood  pressure 130/65, 96% on 2 L.  HEENT:  Normocephalic, atraumatic.  Pupils are equal, round, and  reactive to  light and accommodation.  Scleral icterus not present.  Moist mucous  membranes oral pharynx.  Midline nasal septum.  NECK:  Supple.  2+ carotids.  No bruit.  JVP less than 7.  No thyromegaly.  No adenopathy.  CHEST:  Normal male.  Well healed pacemaker implant site left upper pectoral  area.  Clear to auscultation/percussion.  BACK:  Negative CVAT.  CARDIOVASCULAR:  Regular rate and rhythm.  Normal S1, split S2.  No S3, S4.  No murmurs, rubs, gallops, thrills.  ABDOMEN:  Soft, nontender.  Positive bowel sounds.  No hepatosplenomegaly.  No masses.  No bruits.  GASTROINTESTINAL:  Deferred.  EXTREMITIES:  No cyanosis, clubbing.  There is trace edema.  Peripheral  pulse of 1+ and equal.  NEUROLOGIC:  Alert and oriented x4, pleasant.  Motor/sensory nonfocal.  Cranial nerves II-XII appear to be intact.   Electrocardiogram:  Currently normal sinus rhythm with 100% ventricular  pacing.  Laboratory profile:  Hematocrit 35%.  INR 1.4.  Troponin 0.28  without rise and fall after prolonged period of tachycardia.  BNP 250.  Digoxin level less than 0.2.   IMPRESSION:  1.  Atrial flutter.  Rhythm most consistent with atrial flutter, although I      cannot actually exclude sustained monomorphic ventricular tachycardia.      Patient's electrocardiograms in response to Diltiazem favor      supraventricular tachycardia is most likely diagnosis.  2.  Ischemic cardiomyopathy with congestive heart failure.  Patient has      class I indication for ICD upgrade of his biventricular placed      approximately four years ago.  According to the patient's history he      believes that his biventricular pacemaker was attached to a previously      implanted ICD lead.  I will confirm this on his chest x-ray.  This will      make the upgrade very easy.  I did discuss with him the risks and      benefits of pacemaker to ICD upgrade with device testing.  Patient is     aware of risks and benefits.  We  will attempt to schedule this while he      is in hospital tomorrow.  If not, we can do this as an outpatient at      more convenient date.  It would be advantageous if the patient have it      at the current time as he is no longer on Coumadin therapy.   PLAN:  Resume Coumadin therapy after the  decision made for ICD upgrade.  Patient certainly would also be a candidate for ablation therapy should he  had recurrent episodes of atrial tachyarrhythmia.  I think he would probably  be as close to meeting that criteria at this time.  I will speak with his  primary cardiologist, Dr. Katrinka Blazing about this.  This may be something we can do  in the near future as an outpatient.      MEP/MEDQ  D:  09/12/2004  T:  09/12/2004  Job:  [id number]

## 2010-11-18 NOTE — Discharge Summary (Signed)
[name], Robert NO.:  [id number]   MEDICAL RECORD NO.:  [id number]          PATIENT TYPE:  INP   LOCATION:  4711                         FACILITY:  [facility]   PHYSICIAN:  Marcelyn Bruins, M.D. [name] DATE OF BIRTH:  Dec 30, 1935   DATE OF ADMISSION:  02/06/2005  DATE OF DISCHARGE:  02/20/2005                           DISCHARGE SUMMARY - REFERRING   DISCHARGE DIAGNOSES:  1.  Chronic respiratory failure secondary to end-stage chronic obstructive      pulmonary disease and acute exacerbation with community acquired      pneumonia.  2.  Left upper extremity deep venous thrombosis.  3.  Cardiomyopathy with atrial fibrillation.   PROCEDURES:  1.  Endotracheal tube/oral intubation February 06, 2005, extubated February 09, 2005.  2.  February 08, 2005, PICC line discontinued prior to discharge.   DIAGNOSTIC DATA:  1.  February 13, 2005, portable chest x-ray biventricular pacer placed/AICD      leads unchanged in position.  The heart size is top size of normal.      There is pulmonary vascular prominence most notably centrally which is      stable in nature.  There is no segmental region consolidation or      pneumothorax, with minimal subsegmental atelectatic changes left mid      lung zone.  This is in comparison to chest x-ray on February 06, 2005,      demonstrating a right basilar air space opacity suspicious for      pneumonia.  Cardiomegaly and pulmonary venous hypertension.  2.  January 07, 2005, flat plate of abdomen for coffee-ground emesis.      Nonspecific distention of proximal small-bowel loops noted.  Question      partial mechanical small-bowel obstruction versus ileus.   LABORATORY DATA:  February 20, 2005, sodium 136, potassium 3.6, chloride 97,  CO2 31, glucose 84, BUN 14, creatinine 1.1, calcium 8.7.  February 20, 2005,  PT 36.5, INR 3.7.  February 14, 2005, blood cultures negative.  February 06, 2005, blood cultures negative.  February 06, 2005, sputum culture  demonstrate  nonpathologic normal oral flora.  February 06, 2005, urine culture negative.  February 13, 2005, urine culture negative.   BRIEF HISTORY:  This is a 75 year old white male with past medical history  of ischemic cardiomyopathy and chronic obstructive pulmonary disease who  presented to the emergency room on February 06, 2005, with a two-day history  of cough without sputum production.  He complained of progressive shortness  of breath, exertional dyspnea.  He denied chest pain or palpitations.  He  presented to the emergency room on the day of admission with severe  shortness of breath and could not get up.  He called 911.  He was intubated  on the way to the emergency room by EMS.  He denied any history of fever or  chills prior to intubation, no weight gain or loss.  No problem with  diarrhea or vomiting.   PAST MEDICAL HISTORY:  1.  Ischemia.  2.  Ischemic cardiomyopathy with congestive heart failure.  3.  Status post ICD placement or recurrent SVT.  4.  COPD on home oxygen and atrial fibrillation/flutter on Coumadin.   HOSPITAL COURSE BY DISCHARGE DIAGNOSIS:  PROBLEM #1 -  CHRONIC RESPIRATORY  FAILURE SECONDARY TO END-STAGE CHRONIC OBSTRUCTIVE PULMONARY DISEASE WITH  ACUTE EXACERBATION SECONDARY TO COMMUNITY ACQUIRED PNEUMONIA (NO ORGANISMS  SPECIFIED):  [name] was admitted to the emergency room on February 06, 2005, initial chest x-ray demonstrating  right lower lobe air space disease  consistent with probable community acquired pneumonia.  As mentioned,  patient was intubated for acute respiratory failure.  Antibiotic therapy  consisted of Maxipime and azithromycin.  Maxipime initiated on February 06, 2005 and discontinued on February 12, 2005.  Azithromycin initiated February 06, 2005, discontinued on February 12, 2005.  Converted to Avelox on February 12, 2005, p.o. and discontinued on February 13, 2005.  All diagnostic culture data  proved to be negative for work-up.  No  organism was specified.  Patient was  supported on the ventilator with the usual protocol consistent of GI  prophylaxis with proton pump inhibitors, DVT prophylaxis with PAS hose and  sedation protocol for ventilatory compliance which was used from the time of  admission on February 06, 2005, to the time of extubation on February 09, 2005.  He was also managed on insulin protocol while in the intensive care unit  from February 06, 2005, to February 09, 2005.  Patient continued to make slow  but steady progress in his respiratory failure.  He was weaned and extubated  from mechanical ventilation on February 09, 2005, and has made slow but steady  gains.  He is currently on room air saturating 97%.  He is afebrile upon the  time of discharge.  Vital signs are stable.  Breath sounds are clear.   PROBLEM #2 -  LEFT UPPER EXTREMITY DEEP VENOUS THROMBOSIS:  Diagnosed by  venous ultrasound on February 16, 2005.  Patient will be sent home on Coumadin  for this as well as Coumadin for his history of atrial fibrillation.   PROBLEM #3 -  CARDIOMYOPATHY WITH CONGESTIVE HEART FAILURE AND PAROXYSMAL  ATRIAL FIBRILLATION:  Patient  has left ventricular ejection fraction of 5  to 15%.  He is status post ICD placement and managed by Dr. [name].  His  congestive heart failure was managed with aggressive diuresis and oxygen  support during admission.  Paroxysmal atrial fibrillation was managed with  amiodarone per cardiology.  Dr. [name] has followed him during  hospitalization and will be following at the time of discharge.  No further  recommendations other than oral amiodarone for discharge regimen.   PROBLEM #4 -  RESOLVED QUESTIONABLE UPPER GASTROINTESTINAL BLEED:  Dr.  Matthias Hughs was consulted for this because of apparent small-bowel obstruction  and questionable coffee-ground emesis.  His recommendations at that time  during hospitalization was aggressive proton pump inhibitors and gastric decompression.   Also, he was slightly coagulopathic upon the time of  admission and immediate recommendations were to reverse anticoagulation.  This has again been resolved and stable at the time of discharge.   DISCHARGE INSTRUCTIONS:   MEDICATIONS:  1.  Coumadin per Dr. Katrinka Blazing.  2.  Zocor 20 mg daily.  3.  Nitroglycerin patch 0.2 mg daily.  4.  Furosemide 80 mg in the morning and 40 mg in the evening.  5.  Coreg 6.25 mg tablets half tablet twice a day.  6.  Spiriva 18 mcg cap inhaled daily.  7.  K-Dur 20 mEq tablet daily.  8.  Pulmicort 0.25 mg with Xopenex 1.25 mg every six hours inhaled.  9.  Amiodarone 200 mg tablet daily.  10. Albuterol MDI rescue inhaler two puffs as needed.   ACTIVITY:  No restriction.   DIET:  No restrictions. Not applicable.   FOLLOW UP:  He will receive a blood chemistry prior to his visit with Dr.  Shelle Iron on February 28, 2005, at 11:45 p.m.  He is also instructed to keep his  follow-up with Dr. Katrinka Blazing in September.   DISPOSITION:  Upon the time of discharge, the patient is afebrile for over  72 hours.  Heart rate 53 to 92, respirations 14 to 20, blood pressure  114/67, room air saturations 92 to 97%, weight 166.3.  Exam:  ENT no JVD.  Heart regular irregular.  Breath sounds clear with an occasional wheeze.  Abdomen soft and nontender. Extremities with mild dependent edema.   The patient is in no acute distress, feels ready for discharge.  Detailed  instructions were reviewed with the patient and he was discharged to home.      Anders Simmonds, N.P. [facility]    ______________________________  Marcelyn Bruins, M.D. [facility]    PB/MEDQ  D:  02/20/2005  T:  02/20/2005  Job:  [id number]   cc:   Bernette Redbird, M.D.  [address]  [place], [place] [address]  Fax: [phone]   Lyn Records, M.D.  [address]  [address]  [place]  [place] [address]  Fax: [phone]

## 2010-11-18 NOTE — Discharge Summary (Signed)
[facility]  Patient:    [name], [name]                      MRN: [id number] Adm. Date:  [id number] Disc. Date: [id number] Attending:  Lyn Records. Iii Dictator:   Anselm Lis, N.P. CC:         Winn [place]ock. Earl Gala, M.D., primary care Trentyn Boisclair                           Discharge Summary  DATE OF BIRTH:  1935/11/12.  PROCEDURES:  Adenosine Cardiolite which was negative for ischemia; decreased ejection fraction at 15%.  DISCHARGE DIAGNOSES: 1. Ischemic cardiomyopathy.    a. Ruled out myocardial infarction by negative serial cardiac enzymes.    b. No cardiac dysrhythmia by monitor.    c. Adenosine Cardiolite which was negative for ischemia but decreased       ejection fraction of 15%.    d. Prognosis very poor. 2. Hyponatremia with admission serum sodium 128 thought secondary to    congestive heart failure. 3. Chronic left bundle branch block. 4. Hypokalemia with admission serum potassium 3.6; supplemented. 5. Tobacco use; relapse of resumption of smoking approximately one half    earlier, one pack per day.  He is currently committed to total smoking    cessation.  He would like to entertain option of the use of Zyban with    Nicoderm patch with follow-up office with Dr. Verdis Prime. 6. Systemic anticoagulation secondary to decreased ejection fraction;    management via our Coumadin clinic Kathi Simpers, Doctor of Pharmacy). 7. Severe ischemic cardiomyopathy with ejection fraction of 15% by [employer].  PLAN: 1. The patient discharged to home in guarded condition. 2. Discharge Medications:    a. (New) Imdur 30 mg p.o. q.d.    b. Lanoxin 0.125 mg p.o. q.d.    c. Coreg 6.125 mg p.o. b.i.d.    d. Vaseretic 10/25 one p.o. b.i.d.    e. Zocor 20 mg p.o. q.d.    f. Coumadin 5 mg p.o. q.d.    g. Nitroglycerin tablet 0.4 sublingual p.r.n. chest pain/shortness of       breath. 3. Activity:  As tolerated. 4. Diet:  Low fat, low  cholesterol. 5. Special instructions:  The patient advised to purchase a home blood    pressure cuff with taking b.i.d. blood pressures to assess for possibility    of hypotension from addition of Imdur.  He will bring these list of    measurements to the clinic with him.  Possible increase of Coreg if    blood pressure will tolerate. 6. Follow-up:  He will follow up with Dr. Verdis Prime on Monday,    February 13, 2000, at 3:15 p.m.  Anticipate discussion of Cardiolite    results including ejection fraction and possible ____________.  Wife    wondering if the patient would be eligible for a heart transplant if this    was indicated.  BRIEF HISTORY OF PRESENT ILLNESS:  (Please also see dictated H&P)  In brief, [name] is a 75 year old male with history of CABG x 3 in 1998; history of congestive heart failure with ejection fraction of 20 to 30% by cardiac catheterization prior to bypass graft surgery.  On the day prior to admission the patient had an episode of profuse diaphoresis and lightheadedness as ambulating to bathroom.  No  chest pain.  Recurrence of discomfort later that evening with question of associated epigastric "congestion."  That evening, he continued with feeling of congestion/indigestion so he presented to the emergency room.  He was admitted to hospital and did rule out for myocardial infarction by negative serial cardiac enzymes.  Further details of hospital admission as above.  PAST MEDICAL HISTORY: 1. Coronary atherosclerotic heart disease x 3 in 1998 by Ramon Dredge B. Tyrone Sage,    M.D., with SVG to intermediate branch, SVG to obtuse marginal branch, and    LIMA to the LAD. 2. Congestive heart failure diagnosed in 1993.  Ejection fraction 20 to 30%    by heart catheterization in 1998. 3. History of transient ischemic attacks. 4. COPD. 5. Status post lumbar surgery x 2. 6. Status post cholecystectomy. 7. Hypercholesterolemia. 8. Systemic anticoagulation secondary to  left ventricular dysfunction.  LABORATORY TESTS AND DATA:  EKG revealed left bundle branch block.  Hemoglobin 13.9, hematocrit 38.1, wbc 13.7, platelets 269.  Protime 20.7 with INR of 2.4.  Digoxin level 0.3. Sodium 127, potassium 3.9, subsequently 3.6 and was supplemented; chloride 94, CO2 25, BUN 12, creatinine 0.6, glucose 101.  Serial cardiac enzymes:  First CK 197 with MB fraction 4.2, second CK 148 with MB fraction 3.1, third CK 142 with MB fraction 3.2.  Troponin I less than 0.03 x 3 sets.  TSH was within normal range at 0.499.  Digoxin level of 0.3.  Protime on February 01, 2000, was 23.1 with INR of 3.0.  Adenosine Cardiolite revealed end diastolic volume of the LV of 450 cc.  End systolic volume 382.  Resting left ventricular ejection fraction of 15%. Global hypokinesia with mainly involvement of the anteroseptal, septal and inferior and mid to distal anterior apical segments, negative for ischemia. DD:  02/01/00 TD:  02/02/00 Job: [id number]

## 2010-11-18 NOTE — Discharge Summary (Signed)
NAMEWILBER, [name] NO.:  [id number]   MEDICAL RECORD NO.:  [id number]          PATIENT TYPE:  INP   LOCATION:  4715                         FACILITY:  [facility]   PHYSICIAN:  Marcelyn Bruins, M.D. [facility] [place] DATE OF BIRTH:  [birth year]   DATE OF ADMISSION:  05/07/2005  DATE OF DISCHARGE:  05/13/2005                                 DISCHARGE SUMMARY   PUL,MONOLOGIST/PRIMARY CARE PHYSICIAN:  Marcelyn Bruins, M.D.   DISCHARGE DIAGNOSES:  1.  Ventilator-dependent respiratory failure secondary to right lower lobe      pneumonia superimposed on severe chronic obstructive pulmonary disease,      now resolved.  2.  History of congestive heart failure with mild flare during      hospitalization, improved with diuresis.  3.  Left arm deep venous thrombosis per venous Doppler.  The patient had      previous deep venous thrombosis earlier this year on a hospital      admission in August.  The patient had increased arm swelling during this      admission and brachial deep venous thrombosis was noted.  The patient is      on Coumadin therapy.   HISTORY OF THE PRESENT ILLNESS:  This is a 75 year old white male patient of  Dr. Shelle Iron, who has a known history of severe COPD, who  report having a  three-day history prior to admission of increased cough and shortness of  breath, which has  progressively worsened.  The patient is brought to the  emergency room in respiratory distress and was found to have hypoxemic  respiratory failure, subsequently requiring intubation.  Chest x-ray reveals  a right lower lobe infiltrate and the patient has some mild hypotension.  The patient is subsequently admitted to the ICU.   LABORATORY DATA:  Initial ABGs showed a pH of 7.151, pCO2  of 85.5, pO2 504,  bicarb 29.8, and O2 saturation of 100%.  WBC count went from 18,900 to 13,  500, hemoglobin 11.7 and platelets 166.000.  Initial potassium was 3.3 and  on discharge was 4.5.  BUN 23 and creatinine 1.0  respectively.  Carbon  dioxide 33, sodium 141, and glucose  134.  INR  on May 12, 2005 was  6.1, AST 42, ALT 44, BNP was initially 213, and prior to discharge 183.  Venous Doppler revealed a positive DVT in the left brachial vein without  extension into the subclavian.   HOSPITAL COURSE:  The patient had a three-day history prior to admission  with progressively worsening shortness of breath and cough.  The patient was  found to be in respiratory distress by EMS with subsequent intubation.  The  patient was admitted with vent-dependent respiratory failure and placed on  the ventilator.  Chest x-ray revealed a right lower lobe infiltrate and the  patient was placed IV antibiotics including Zithromax and Rocephin.  He was  started on IV Solu-Medrol 60 mg every six hours.  The patient began to  improve and was began on a weaning protocol.  He was extubated on May 08, 2005.  The patient  continued to improved and was changed from IV  medications over to p.o. meds.  The patient was noted to have increased left  upper arm swelling and underwent a venous Doppler on May 11, 2005,  which showed a positive DVT in the brachial vein without extension into the  subclavian.  The patient has had a history of this and was continued on  Coumadin therapy.  The patient was found to be supertherapeutic on May 12, 2005 and the Coumadin was held yesterday.  The patient will resume  Coumadin today and has a follow up with Coumadin Therapy next week.   The patient has a history in the past of atrial fibrillation and flutter  with previous left upper extremity DVT during a hospitalization in August of  this year.  He has been on Coumadin and has been followed by the Coumadin  clinic since then.  The patient's chest x-ray on May 12, 2005 showed  increased aeration of the right lower lobe infiltrate.  The patient  verbalized readiness to go home; and, was tolerating p.o. intake without   difficulty.  O2 saturations remained above 92% on room air.   The patient reached hospital benefit on May 13, 2005 and will  discharged today.  The patient did have some mild lower extremity edema with  increased BNP at 213.  The patient was given IV diuretics with decreased  lower extremity edema and decreasing BNP at discharge of 183.   DISCHARGE MEDICATIONS:  The discharge medicines are as follows:  1.  The patient will resume Coumadin on Saturday, May 13, 2005 at 2..5      mg.  2.  Prednisone taper beginning with 20 mg  over the next two days, then 10      mg for two days, then 5 mg for two days and then it will be      discontinued.  3.  Avelox 400 mg daily for seven days and then it will be discontinued.  4.  Benicar 20 mg daily.  5.  Amiodarone 200  mg daily.  6.  Coreg 3.125 mg daily.  7.  K-Dur 20 mEq daily.  8.  Lasix 40 mg twice a day.  9.  Spiriva hand held inhaler daily.  10. Pulmicort 0.25 mg daily and  11. Xopenex 0.63 nebulizer q.6 hours.  12. Zocor 20 mg at bedtime.  13. Tussionex 1 t every 12 hours as needed for cough p.r.n.  14. Nitrol Patch per home prescription.  15. Oxygen as needed.   DISPOSITION AND CONDITION ON DISCHARGE:  The patient has significantly  improved from his COPD exacerbation with ventilator-dependent respiratory  failure and right lower lobe pneumonia, and will be discharged home.  The  patient has a follow up appointment with the Coumadin clinic on May 17, 2005 at 11 A.M.  The patient will call our office on Monday, May 15, 2005, for an appointment next week with Dr. Shelle Iron.   The patient's discharge instructions were discussed with him and the patient  verbalized understanding,      Rubye Oaks, NP [facility]    ______________________________  Marcelyn Bruins, M.D. [facility]    TP/MEDQ  D:  05/13/2005  T:  05/15/2005  Job:  [id number]

## 2010-11-18 NOTE — H&P (Signed)
[name], [name]               ACCOUNT NO.:  [id number]   MEDICAL RECORD NO.:  [id number]          PATIENT TYPE:  INP   LOCATION:  2303                         FACILITY:  [facility]   PHYSICIAN:  Leslye Peer, M.D.  DATE OF BIRTH:  02/24/1936   DATE OF ADMISSION:  05/07/2005  DATE OF DISCHARGE:                                HISTORY & PHYSICAL   CHIEF COMPLAINT:  Shortness of breath and respiratory failure.   HISTORY OF PRESENT ILLNESS:  [name] is a 75 year old man with a  history of severe chronic obstructive pulmonary disease, coronary artery  disease with an ischemic cardiomyopathy and ICD placement for ventricular  tachycardia.  His wife reports that he was well until approximately two or  three days prior to admission, when she noticed that he had an increasing  cough and increased work of breathing with exertion.  Then this past evening  he woke up acutely short of breath, and he asked her to bring him to the  emergency department.  On arrival he was having progressive respiratory  failure with rapid shallow breathing.  He required an endotracheal  intubation.  A chest x-ray was performed which identified a new right lower  lobe infiltrate superimposed on severe hyper-inflation and emphysema.  He is  admitted for further care.   PAST MEDICAL HISTORY:  1.  Severe chronic obstructive pulmonary disease.  2.  Coronary artery disease with an ischemic cardiomyopathy and an estimated      LVEF of 5%-15%.  He is followed by Dr. Lyn Records.  3.  Atrial fibrillation/flutter.  4.  History of recurrent VT, status post AICD placement.  5.  Left upper extremity deep venous thrombosis on his last admission which      was in August 2006.  6.  History of an upper GI bleed on his last admission which was in August      2006.  7.  History of colon cancer, status post resection.   ALLERGIES:  No known drug allergies.   MEDICATIONS:  1.  Coumadin 5 mg daily.  2.  Zocor 20 mg  daily.  3.  Nitroglycerin patch 0.2 mg daily.  4.  Spiriva, one inhalation daily.  5.  Digitek 0.125 mg daily.  6.  Coreg 3.125 mg b.i.d.  7.  Benicar 20 mg daily.  8.  Lasix 40 mg p.o. t.i.d.  9.  Potassium chloride 20 mEq daily.  10. Amiodarone 200 mg daily.  11. Pulmicort q.6h.  12. Xopenex nebulizers 1.25 mg q.6h.   SOCIAL HISTORY:  The patient lives with his wife.  He is a former heavy  tobacco user with a greater than 100-pack-year tobacco history.  He quit 1-  1/2 months ago.  He does not use any significant alcohol.   FAMILY HISTORY:  His father had congestive heart failure.  His mother had  hypertension and a stroke.   PHYSICAL EXAMINATION:  VITAL SIGNS:  He is afebrile.  Blood pressure 100/60,  heart rate 73, respirations 24.  SPO2 is 100% on volume control 500 mL with  a respiratory rate  of 24 and FIO2 of 50% and 5 of PEEP.  GENERAL:  This is a pale, intubated sedated man.  HEENT:  He has an endotracheal tube in place.  His pupils are equal and  react sluggishly.  LUNGS:  Bilateral diffuse expiratory wheezes.  He has a focal right lower  lobe bronchial breath sound.  HEART:  A regular rate and rhythm without murmur, rub or gallop.  S1 and S2  are normal.  ABDOMEN:  Soft, nontender, nondistended with positive bowel sounds.  EXTREMITIES:  He has no significant lower extremity edema.  NEUROLOGIC:  He is unresponsive.   LABORATORY DATA:  ABG on presentation was 7.15, 85, 500 and 30 on 100% FIO2.  Troponin less than 0.05.  Beta natriuretic peptide was 213.  White blood  cell count 18.9, hematocrit 43.3, platelets 286.  Sodium 129, potassium 3.3,  chloride 100, CO2 of 26, BUN 8, creatinine 1.2, glucose 194.  Calcium 8.5,  total bilirubin 0.9, AST 42, ALT 44, alkaline phosphatase 104, total protein  8.8, albumin 4.4.  His digoxin level was 0.3.  His INR is 2.0, PTT 42.   A chest x-ray in the emergency room prior to intubation shows hyper-  inflation with significant  emphysema, AICD in place, a right lower lobe  streaky infiltrate and no effusions.   IMPRESSION/PLAN:  1.  Ventilator-dependent respiratory failure, secondary to a chronic      obstructive pulmonary disease exacerbation in the setting of a right      lower lobe community-acquired pneumonia:  We will initiate azithromycin      and ceftriaxone.  He will be started on standing bronchodilators and      Solu-Medrol 60 mg IV q.6h.  Will follow his ABG and his pulmonary      improvement, and consider him for liberation from the mechanical      ventilator as appropriate.  Will initiate a sedation protocol until he      is ready to be extubated.  2.  History of ischemic cardiomyopathy and congestive heart failure:  Will      follow his I&O's closely and continue his Lasix.  His Coreg and ACE      inhibitor will be continued.  Will hold his nitrates at least      temporarily, given his relative hypotension.  Finally will notify Dr.      Verdis Prime that [name] has been admitted to the hospital, in the      morning.  3.  Atrial fibrillation:  We will continue his Coreg, as mentioned and use      p.r.n. beta blockers for rate control as necessary.  We will restart his      Coumadin once we are sure that he will not need any procedures in the      near future.  4.  History of left upper extremity deep venous thrombosis:  Will continue      his Coumadin safe to do so, as mentioned above.  5.  Prophylaxis will be with Protonix 40 mg IV daily and with enoxaparin if      his INR is just below 2.0.           ______________________________  Leslye Peer, M.D.     RSB/MEDQ  D:  05/07/2005  T:  05/07/2005  Job:  [id number]   cc:   Marcelyn Bruins, M.D. [facility]  [address]  [place]  [place] [address]   Lyn Records, M.D.  Fax: [phone]

## 2010-11-18 NOTE — Consult Note (Signed)
NAMEHAMZA, EMPSON NO.:  [id number]   MEDICAL RECORD NO.:  [id number]          PATIENT TYPE:  EMS   LOCATION:  MAJO                         FACILITY:  [facility]   PHYSICIAN:  Meade Maw, M.D.    DATE OF BIRTH:  March 15, 1936   DATE OF CONSULTATION:  DATE OF DISCHARGE:                                   CONSULTATION   CONSULTING PHYSICIAN:  Meade Maw, M.D.   REFERRING PHYSICIAN:  Theressa Millard, M.D.   INDICATION FOR ADMISSION:  Dyspnea, diaphoresis which is possibly an anginal  equivalent.   HISTORY:  [name] is a very pleasant 75 year old gentleman who was  recently discharged from the hospital for a COPD exacerbation.  He states  that his breathing had returned back to baseline.  Today at approximately 1  p.m., he became profusely diaphoretic which was new for him.  This persisted  for approximately 15-20 minutes and was associated with shortness of breath.  He subsequently transferred to [facility].  At [facility], he was  noted to be in a supraventricular tachycardia at a rate of 150 beats per  minute.   PAST MEDICAL HISTORY:  1.  Coronary artery disease, status post CABG in 1998.  Ejection fraction at      that time was noted to be 20-30%.  At this time, he had a saphenous vein      graft to the intermediate branch, saphenous vein graft to the obtuse      marginal, and a LIMA to his left anterior descending artery.  Of note,      the patient had no complaints of chest pain at that time either.  He was      evaluated, in July 2001, for perfuse diaphoresis and presyncope at that      time.  There was no subsequent cardiac workup performed during this      period.  2.  Ischemic cardiomyopathy.  Ejection fraction 20-30%.  3.  Dyslipidemia.  4.  Severe COPD.  5.  TIA.   PAST SURGICAL HISTORY:  1.  Coronary artery bypass surgery in 1998.  2.  Colon cancer in October 2005.  3.  Lumbar surgery.   MEDICATIONS:  At the time of admission  include:  1.  Benicar 20 mg daily.  2.  Coreg 6.25 mg b.i.d.  3.  Coumadin adjusted to maintain an INR of 2-3.  His INR has been      therapeutic with his last INR at 2.  4.  Zocor 20 mg daily.  5.  Lasix 40 mg daily.  6.  Doxycycline 100 mg daily.  7.  Spiriva one inhalation daily.   SOCIAL HISTORY:  He states that he stopped smoking six months prior to this  presentation.  Mother passed with stroke, following a hysterectomy.   REVIEW OF SYSTEMS:  Significant for increased shortness of breath as noted  above.  Otherwise he has been in his usual state of health.  He pretty much  has a sedentary activity.  His most strenuous activity is described as  rolling the trash can to  the curb.  Of note, the patient did develop an  episode of atrial fibrillation, following his colectomy.  This resolved  within 24 hours.  His review of systems is otherwise negative.   PHYSICAL EXAMINATION:  VITAL SIGNS:  Blood pressure 111/52, current heart  rate is 82 beats per minute.  He is afebrile.  His O2 sat is 96%.  GENERAL:  Reveals a middle-aged male appearing older than his stated age  currently in no acute distress.  HEENT:  Unremarkable.  No evidence of neck vein distention.  Exam is made  difficult, however, by the patient's body habitus.  PULMONARY:  Reveals breath sounds which are severely diminished.  There is  diffuse expiratory wheezing noted and the patient states that this his  baseline.  CARDIOVASCULAR:  Reveals a regular rate and rhythm.  There is a wide split  S2.  There are no audible murmurs noted.  His PMI is not palpable.  ABDOMEN:  Soft, nontender.  No unusual bruits or pulsations are noted.  EXTREMITIES:  Reveal no peripheral edema.   LABORATORY DATA:  ECG was reviewed.  Telemetry strip from [facility]  revealed a sinus tachycardia with a rate of 120 beats per minute.  His ECG  at 9, reveals a sinus tach with a rate of 120 beats per minute.  It is  atrially sensed and  ventricularly paced.  At 7 p.m., he was noted to have a  supraventricular tachycardia atrially sensed at a rate of 163 beats per  minute.  He was subsequently started on a Cardizem drip and has had no  further tachy arrhythmias.  PH is 7.45, pCO2 is 35, bicarbonate is 24.  White count is 8.6, red count is 4.31, hemoglobin is 12, hematocrit 37.  His  platelet count is 172.  Sodium is 132, potassium is 3.8, BUN is 8,  creatinine is 0.9.  BNP is slightly elevated at 250.  His chest x-ray is  currently pending.   IMPRESSION:  1.  Diaphoresis.  This may be an anginal equivalent.  He will be admitted to      telemetry.  Serial cardiac enzymes will be obtained.  We will continue      with his current medical regimen of Coreg.  2.  Supraventricular tachycardia.  This appears to be atrially sensed and      ventricularly paced.  The patient currently is stable with a Cardizem at      5 mg intravenous.  We will continue with this at this time.  He is on      Coumadin with an INR of 2.2 on his last office visit.  3.  Ischemic cardiomyopathy.  Ejection fraction 15-20%.  The patient needs      to be considered for an automatic      implantable cardioverter-defibrillator implantation.  We will defer this      to Dr. Verdis Prime.  4.  Chronic obstructive pulmonary disease.  We will continue with his      Spiriva one inhalation daily.  5.  Colon cancer.  The patient states that he was told he was free of      disease.      HP/MEDQ  D:  09/11/2004  T:  09/11/2004  Job:  [id number]   cc:   Theressa Millard, M.D.  [address] [place]  [place] [address]  Fax: [phone]   Lyn Records III, M.D.  [address]  [place]  [place] [address]  Fax: [phone]

## 2010-11-18 NOTE — Op Note (Signed)
[name], [name] NO.:  [id number]   MEDICAL RECORD NO.:  [id number]          PATIENT TYPE:  AMB   LOCATION:  ENDO                         FACILITY:  [facility]   PHYSICIAN:  Danise Edge, M.D.   DATE OF BIRTH:  03-30-1936   DATE OF PROCEDURE:  04/11/2005  DATE OF DISCHARGE:                                 OPERATIVE REPORT   REFERRING PHYSICIAN:  Dr. Theressa Millard   PROCEDURE:  Colonoscopy and polypectomy.   PROCEDURE INDICATION:  [name] is a 75 year old male, born Nov 26, 1935.  Approximately 1 year ago, [name] underwent a colonoscopy to  evaluate iron deficiency anemia.  A colon cancer was discovered in the  sigmoid colon.  [name] has undergone segmental sigmoid colonic  resection to remove the colon cancer.  Surveillance colonoscopy with  polypectomy is scheduled today to prevent recurrent colon cancer.   [name] has severe chronic obstructive pulmonary disease.  He also has  a severe ischemic cardiomyopathy with an estimated left ventricular ejection  fraction of only 15%.  In August, he was hospitalized with a wide QRS  complex tachycardia.  He now has an intracardiac defibrillator.   ENDOSCOPIST:  Dr. Reece Agar   PREMEDICATION:  Versed 4 mg, Demerol 50 mg.   PROCEDURE:  After obtaining informed consent, [name] was placed in the  left lateral decubitus position.  I administered intravenous Demerol and  intravenous Versed to achieve conscious sedation for the procedure.  The  patient's blood pressure, oxygen saturation, and cardiac rhythm were  monitored throughout the procedure and documented in the medical record.   Anal inspection was normal.  Digital rectal exam reveals a nonnodular  prostate.  The Olympus adjustable pediatric colonoscope was introduced into  the rectum and easily advanced to the cecum.  A normal-appearing ileocecal  valve and appendiceal orifice were identified.  Colonic preparation for  the  exam today was satisfactory.   Rectum.  From the distal rectum, a 2-mm sessile polyp was removed with the  cold snare.  Sigmoid colon and descending colon normal.  Splenic flexure normal.  Transverse colon normal.  Hepatic flexure.  A 1-mm sessile polyp was removed with the cold biopsy  forceps.  Ascending colon normal.  Cecum and ileocecal valve normal.   ASSESSMENT:  1.  In 2005, left segmental colon resection for colon cancer.  2.  A 1-mm polyp was removed from the hepatic flexure with the cold biopsy      forceps.  3.  A 2-mm polyp was removed from the distal rectum with the cold snare.  4.  Severe chronic obstructive pulmonary disease.  5.  Severe ischemic cardiomyopathy with an estimated left ventricular      ejection fraction 15%; intracardiac defibrillator is in place.   RECOMMENDATIONS:  I do not recommend further colonoscopic screening given  [name] severe chronic obstructive pulmonary disease and severe  ischemic cardiomyopathy.           ______________________________  Danise Edge, M.D.     MJ/MEDQ  D:  04/11/2005  T:  04/11/2005  Job:  [id number]   cc:   Theressa Millard, M.D.  Fax: [phone]   [facility]

## 2010-11-18 NOTE — Cardiovascular Report (Signed)
[name], [name]               ACCOUNT NO.:  [id number]   MEDICAL RECORD NO.:  [id number]          PATIENT TYPE:  INP   LOCATION:  2011                         FACILITY:  [facility]   PHYSICIAN:  Janeece Riggers. Severiano Gilbert, M.D.    DATE OF BIRTH:  09-10-1935   DATE OF PROCEDURE:  DATE OF DISCHARGE:                              CARDIAC CATHETERIZATION   PROCEDURES PERFORMED:  1.  Removal of biventricular pacemaker.  2.  Revision of skin pocket for pacemaker.  3.  Implantation of dual chamber biventricular defibrillator with      implantation of RV defibrillation coil.  4.  Fluoroscopy for implantation.  5.  Electrophysiologic testing of ICD lead to include defibrillation as well      as testing of ICD generator.   PREPROCEDURE DIAGNOSES:  Congestive heart failure and posterior myocardial  infarction, ejection fraction less than 30% with prior implanted dual  chamber pacemaker with resynchronization therapy for upgrade to implantable  cardioverter defibrillator.   POSTPROCEDURE DIAGNOSIS:  Congestive heart failure and posterior myocardial  infarction, ejection fraction less than 30% with prior implanted dual  chamber pacemaker with resynchronization therapy for upgrade to implantable  cardioverter defibrillator.   OPERATOR:  Mark E. Severiano Gilbert, M.D.   COMPLICATIONS:  None.   ESTIMATED BLOOD LOSS:  Less than 50 mL.   PROCEDURE IN DETAIL:  The patient was brought to the catheterization  laboratory in fasting state.  The patient's left pectoral area was prepped  and draped in the usual manner.  Ancef 1 g IV immediately prior to skin  incision for antimicrobial prophylaxis.  Local anesthesia 1% local lidocaine  subcutaneously for anesthesia.  Conscious sedation obtained with  intermittent injections of midazolam and fentanyl.  The previously implanted  ICD pocket was sharply incised to the level of the capsule.  The capsule was  carefully opened and the ICD leveraged to the skin surface without  difficulty.  This was all preceded by local anesthesia with 1% lidocaine.  The leads were connected from the defibrillator without difficulty.  The  right atrial and LV leads were tested and found to be electronically  suitable.  The right ventricular lead which was placed up on the right  ventricular septum was capped.  Using a thin-walled needle technique along  the first rib, the axillary vein was cannulated after several passes.  The  sheath system did not want to advance serial dilators from 7 [place] to 9  [place] were introduced.  After predilation, it was easy to advance a 7-  [place] sheath system into the brachiocephalic vein.  Under fluoroscopic  guidance, a Medtronic model number [id number] passive fixation lead was advanced  into the apex of the right ventricle.  After determining adequate electronic  characteristics, the lead was sutured in place.  The lead systems were then  connected to the ICD.  The ICD was placed within the pocket was closed in  layers of 2-0 and 4-0 Vicryl.  T-wave shock was used to induce ventricular  fibrillation at which time patient had a missed defibrillation at 20 joules  and success at 25.  A second episode of ventricular fibrillation was  initiated 5 minutes later where the patient missed at 25 and at max out for  35 joules of device.  Fluoroscopy was performed and it was apparent that the  right ventricular defibrillation lead had moved fluoroscopically.  The  pocket was re-opened without difficulty and the defibrillator was delivered  to the skin surface and the shock system was unhooked.  Multiple attempts  were made to reposition the pass at fixation ventricular lead in the right  ventricular apex but all were limited by instability and continued movement  of the lead.  The lead was exchanged for a Medtronic model [id number] active  fixation defibrillation lead which was advanced fluoroscopically easily into  the right ventricular apex.  The lead was  actively fixed to the extreme  right ventricular apex with excellent pacing as well as sensing  characteristics.  The lead was then sutured in place to the prepectoral  fascia using 0 silk ligatures.  Because of the multiplicity of shocks, no  additional defibrillation testing was performed.  The system was closed in  layers with 2-0 and 4-0 Vicryl without difficulty.  A bulky pressure  dressing was applied and the patient tolerated the procedure well.  He was  returned to the holding area in stable hemodynamic condition.   The electronic characteristics of the right atrial lead demonstrated a  capture threshold of 0.3 V at 0.5 msec, impedance of 644 ohms, P-wave was  0.7, which was chronic.  The left ventricular lead demonstrated capture  threshold of 0.6 V at 0.5 msec, impedance 535 ohms, and a measured R wave of  9.7.  The newly implanted right ventricular apical lead demonstrated a  capture threshold of 0.9 V at 0.5 msec, impedance 950 ohms, and a measured R  wave of 30 mV.  There was absence of diaphragmatic pacing with the newly  implanted right ventricular apex lead.  The device was programmed into a  dual chamber cardiac resynchronization mode with heart rates between 150 and  130 beats per minute.  AV delay was 100 msec.  Output in the chronic atrial  and ventricular leads was 2 V at 0.5 msec.  The newly implanted right  ventricular lead gate threshold output was programmed with an output of 5 V  at 0.5 msec.  A single zone of tachytherapy was programmed on for heart  rates greater than 188 beats per minute.  The patient will receive maximum  outputs of the device.   PLAN:  To bring the patient back in 1-2 weeks for threshold testing after  there has been time for the pocket to mature and allow there to be time to  evolve since the multiple shock therapies today.      MEP/MEDQ  D:  09/13/2004  T:  09/13/2004  Job:  [id number]

## 2010-11-18 NOTE — Consult Note (Signed)
[name], [name]               ACCOUNT NO.:  [id number]   MEDICAL RECORD NO.:  [id number]          PATIENT TYPE:  INP   LOCATION:  2899                         FACILITY:  [facility]   PHYSICIAN:  Janeece Riggers. Severiano Gilbert, M.D.    DATE OF BIRTH:  [birth year]   DATE OF CONSULTATION:  10/20/2004  DATE OF DISCHARGE:                                   CONSULTATION   EP LABORATORY NOTE:   OPERATOR:  Mark E. Severiano Gilbert, M.D.   COMPLICATIONS:  None.   PROCEDURE PERFORMED:  ICD safety margin test and lead evaluation.   PREPROCEDURE DIAGNOSES:  Congestive heart failure, dilated cardiomyopathy,  left bundle branch block.   POSTPROCEDURE DIAGNOSES:  Congestive heart failure, dilated cardiomyopathy,  left bundle branch block.   COMPLICATIONS:  None.   OPERATOR:  Mark E. Peele, M.D.   ESTIMATED BLOOD LOSS:  Less than 30 cc.   PROCEDURE IN DETAIL:  The patient was brought to the EP laboratory in a  fasting state.  The patient was connected to monitoring and external  defibrillation equipment in usual manner.  His Medtronic biventricular ICD  was interrogated and found to have appropriate A, RV and LV thresholds.  After an appropriate level of moderate conscious sedation was obtained, the  patient was fibrillated using a T wave shock.  A single 25 joule standard  polarity defibrillation shock was delivered which resulted in resumption of  a perfusing rhythm.  The charge time was 4.16 seconds.  The wave form was  biphasic.  The polarity was B2AX.  Delivered energy was approximately 25  joules with Impedence of 43 ohms.  This provided a 10 or greater joule  safety margin for this device.  No additional shocks were performed.  The  patient was allowed to recover from his conscious sedation without  complication.  The device is a Medtronic in synch Maxims 304.  The device  was programmed into a dual chamber pacing mode.  Lower rate 60, upper rate  120, AV interval was 100 milliseconds.  Outputs in the A lead  were 2 volts  at 0.5 milliseconds, RV lead was 5 volts at 0.5 milliseconds.  LV lead was  2.5 volts at 0.4 milliseconds.  Mode switch was programmed on.  The patient  is anticoagulated.  The patient has a single zone of therapy for VF at which  he will receive a 35 joule shock multiple times with the switch and polarity  late.     MEP/MEDQ  D:  10/20/2004  T:  10/20/2004  Job:  [id number]

## 2010-11-18 NOTE — Assessment & Plan Note (Signed)
[facility]                             PULMONARY OFFICE NOTE   NAME:CALDWELLZakhi, Dupre                      MRN:          [id number]  DATE:09/04/2006                            DOB:          [birth year]    PULMONARY/ACUTE EXTENDED OFFICE VISIT   HISTORY:  A 75 year old white male who carries a diagnosis of congestive  heart failure (ejection fraction of only 15%) unknown date and COPD that  is primarily thought to be emphysema with an FEV1 documented at 0.78 on  July of 2005, with baseline dyspnea with minimal exertion, but not  oxygen dependence. He was seen by our nurse practitioner on February 22,  with a two day history of increasing cough productive of green sputum  and actually improved on Avelox for five days. However, within five days  of stopping the Avelox he began having increasing cough and congestion  with again green sputum but no pleuritic pain increase, but has had  increasing dyspnea now at oxygen dependent 24 hours a day whereas  previously he had only used it p.r.n.   The patient is on exceptionally complicated medical regimen which  includes nonspecific beta-blockers in the form of Coreg, but also uses  Xopenex and Pulmicort. He is maintained on prednisone at 10 mg per day  as well as Pulmicort.   He denies any pleuritic pain, orthopnea, PND or resting dyspnea, fevers,  chills, sweats or leg swelling.   PHYSICAL EXAMINATION:  He is an ambulatory uncomfortable, but not toxic-  appearing white male in no acute distress. He has stable vital signs.  HEENT: Is unremarkable. Oropharynx is clear.  NECK: Supple without cervical adenopathy or tenderness. Trachea is  midline without lymphadenopathy.  LUNGS: Lung fields reveal mid-expiratory rhonchi bilaterally one hour  after last using his Xopenex.  HEART: Regular rate and rhythm without murmur, gallop or rub.  ABDOMEN: Soft, benign.  EXTREMITIES: Warm without calf tenderness,  cyanosis, clubbing or edema.   Hemoglobin saturation is 94% on 3 liters of nasal prongs.   Chest x-ray showed no acute changes, but does have cardiomyopathy. No  evidence of airspace disease or interstitial edema or effusions.   IMPRESSION:  Acute hypoxemic respiratory failure in the setting of  purulent trachea bronchitis that appears to have transiently responded  to Avelox. He may have a reservoir in the upper airway (namely sinus),  but I see no evidence of pneumonia on chest x-ray. I am therefore going  to elect to treat him with another course of fluoroquinolones, this time  Levaquin 750 for seven days (in case there is a partially treated  pseudomonas present given the severity of his disease). I have also  offered to admit him if he deteriorates in any way.   I went over these instructions very specifically in detail in writing  with the recommendation that he also increase Mucinex to two b.i.d. and  increase prednisone to two tablet daily (20 mg per day) and then release  the dose down to 10 mg per day.   If he has persistent rhonchi or remains beta dependent I  would  strongly consider switching from Coreg to bisoprolol since Coreg is a  nonspecific beta-blocker and may be interfering with the effectiveness  of Xopenex. Will defer this issue however to Dr. [name], his  cardiologist.   I have arranged to see the patient back in 10 days and seen by our nurse  practitioner for full medication reconciliation and generation of  medication calendar noting that the patient is struggling quite a bit  with a very complex medical regimen with multiple physicians involved in  his care and actually has organized his medicines by doctor rather than  by time of day that he takes them in a very user unfriendly and somewhat  ambiguous fashion several times stating Well, I do not exactly take the  medicine the way it is written down. Therefore, I think it is important  to move the  mountain to [facility] by asking the patient simply to bring  all of his medications and then tell us exactly how he is taking them  and then make modifications if necessary in writing to perform a paper  trail for the various recommendations that he gets.     Charlaine Dalton. Sherene Sires, MD, [facility]  Electronically Signed    MBW/MedQ  DD: 09/04/2006  DT: 09/04/2006  Job #: [id number]   cc:   Lyn Records, M.D.  Theressa Millard, M.D.

## 2010-11-18 NOTE — Cardiovascular Report (Signed)
NAMESHEENA, SIMONIS NO.:  [id number]   MEDICAL RECORD NO.:  [id number]          PATIENT TYPE:  INP   LOCATION:  2011                         FACILITY:  [facility]   PHYSICIAN:  Lyn Records III, M.D.DATE OF BIRTH:  07/02/1936   DATE OF PROCEDURE:  09/12/2004  DATE OF DISCHARGE:                              CARDIAC CATHETERIZATION   INDICATIONS:  The patient has known ischemic cardiomyopathy and underwent  three-vessel coronary bypass grafting in 1998. He presented on this occasion  with tachy arrhythmia, dyspnea and had a very minimal enzyme leak. The study  is being done to document graft patency.   PROCEDURE PERFORMED:  1.  Left heart cath.  2.  Selective coronary angio.  3.  Left ventriculography.  4.  Saphenous vein graft angio.  5.  Left internal mammary graft angiography.   DESCRIPTION:  After informed consent, a 6-French sheath was used in the  right femoral artery. We then used a 6-French A2 multipurpose catheter for  hemodynamic recordings, left ventriculography by hand injection of 30 frames  per second, saphenous vein graft angiography and native right coronary  angiography. We then used a #4 left Judkins catheter for left coronary  angiography and a 6-French internal mammary catheter for left internal  mammary angiography. The patient tolerated procedure without complications.  Anatomy was not adequate to allow AngioSeal arteriotomy closure.   RESULTS:  1.  Hemodynamic data:      1.  Aortic pressure 114/60.      2.  Left ventricular pressure 114/7.  2.  Left ventriculography: The left ventricle is severely dilated. There is      dyssynchronous LV activation. There is severe global hypokinesis.      Ejection fraction is in the 5-15% range. No obvious MR is noted but a      very small amount of contrast was used.  3.  Selective coronary angiography.      1.  Left main coronary: The left main is long and contains severe          narrowing up to  90% within tubular calcification throughout the          entirety of the vessel length.      2.  Left anterior descending coronary: Totally occluded at its ostium          from the left main.      3.  Circumflex artery: Totally occluded.      4.  Ramus intermedius artery: Totally occluded.  4.  Right coronary artery: The right coronary artery is dominant vessel      giving origin to a large PDA and left ventricular branch that the      reaches near the apex. There is 50% stenosis in the mid RCA before the      bend. There is 30-40% narrowing more proximally. Collaterals to the      distal circumflex arise from the right coronary.  Also septal perforator      collaterals give origin to collaterals to the proximal septum.  5.  Saphenous vein graft angiography.  1.  Saphenous vein graft to the obtuse marginal: This graft is widely          patent and the obtuse marginal itself is also widely patent..      2.  Saphenous vein graft to the ramus intermedius branch: Ramus          intermedius branch is widely patent. The graft is also widely          patent.  6.  LIMA graft to the LAD: This graft is widely patent. The LAD is also      widely patent.   CONCLUSION:  1.  Severe ischemic cardiomyopathy with ejection fraction less than a 15%.  2.  Patent saphenous vein grafts and left internal mammary artery as      outlined above.  3.  Severe left coronary disease as outlined above with essential total      occlusion of the left main circumflex, left anterior descending and      ramus. Mild to moderate mid right coronary artery disease.  4.  Widely patent saphenous vein grafts and left internal mammary artery.   RECOMMENDATIONS:  Medical therapy and if recurrent ST-T,  consider ablation.  Consider upgrade to AICD/BiV.      HWS/MEDQ  D:  09/12/2004  T:  09/12/2004  Job:  [id number]   cc:   Meade Maw, M.D.  [address]  [place]  [place] [address]  Fax: [phone]    Theressa Millard, M.D.  [address] [place]  [place] [address]  Fax: [phone]   Janeece Riggers. Severiano Gilbert, M.D.  Fax: [phone]

## 2010-11-18 NOTE — H&P (Signed)
[facility]  Patient:    [name]                       MRN: [id number] Adm. Date:  [id number] Attending:  Lyn Records. Iii CC:         Darci Needle, M.D.                         History and Physical  CHIEF COMPLAINT:  Sweating.  HISTORY OF PRESENT ILLNESS:  The patient is a 75 year old white male with a history of coronary artery disease status post coronary artery bypass surgery times three in 1998.  He has a history of congestive heart failure with ejection fraction of 20-30%.  At 4 p.m. today, the patient awoke from a nap. On his way to the bathroom, he became lightheaded and developed profuse diaphoresis.  After urinating, he laid down his couch for a while and felt better.  He denied any chest pain during this time.  At 5 p.m., he started fixing his dinner, and again he developed profuse sweating with shortness of breath and a sensation of congestion in his chest. He denied any pain.  He took three sublingual nitroglycerin with partial relief but still felt congestion on arrival to the emergency room.  PAST MEDICAL HISTORY: 1. Status post CABG times three in 1998 by Dr. Tyrone Sage, saphenous vein graft    to intermediate branch, saphenous vein graft to obtuse marginal branch, and    a LIMA graft to the LAD. 2. Congestive heart failure diagnosed in 1993.  Ejection fraction 20-30% by    cardiac catheterization in 1998. 3. History of TIAs. 4. COPD. 5. Status post lumbar surgery times two. 6. Status post cholecystectomy. 7. Hypercholesterolemia.  MEDICATIONS: 1. Lanoxin 0.125 mg daily. 2. Coreg 6.25 mg b.i.d. 3. Vaseretic 10/25 mg b.i.d. 4. Zocor 20 mg per day. 5. Coumadin 5 mg per day.  ALLERGIES:  No known drug allergies.  SOCIAL HISTORY:  The patient is married.  He smokes one pack per day and denies alcohol use.  FAMILY HISTORY:  Father died at age 67 with congestive heart failure.  Mother died with a stroke  following hysterectomy.  Three brothers have a history of hypertension.  REVIEW OF SYSTEMS:  As per HPI.  PHYSICAL EXAMINATION:  GENERAL:  Well-developed white male in no apparent distress.  VITAL SIGNS:  Blood pressure is 108/58, pulse 78 and regular, respirations 18, and temperature afebrile.  HEENT:  Pupils are equal, round and reactive.  Conjunctivae are clear. Oropharynx is clear.  NECK:  Without JVD, adenopathy, or bruits.  LUNGS:  Clear.  CARDIOVASCULAR:  Reveals a regular rate and rhythm without murmurs, rubs, gallops, or clicks.  ABDOMEN:  Soft and nontender.  Bowel sounds are positive.  EXTREMITIES:  Femoral, popliteal, and posterior tibial pulses are 2+ bilaterally.  He has trace dorsalis pedis pulses, and there is no edema.  NEUROLOGIC:  Intact.  DIAGNOSTIC STUDIES:  ECG shows a normal sinus rhythm with old left bundle branch block.  Chest x-ray shows cardiomegaly with no active disease.  Hemoglobin is 13.9, hematocrit 38.1, white count 13,700, and platelets 269,000.  Pro time was 20.7 with an INR of 2.4.  Digoxin level is 0.3.  Sodium 127, potassium 3.9, chloride 94, CO2 25, BUN 12, creatinine 0.6, and glucose 101.  Other chemistries are normal.  CPK is 197 with an MB of 4.2.  This is borderline.  Troponin is less than 0.03.  IMPRESSION: 1. Diaphoresis/lightheadedness/chest tightness.  Possible anginal equivalent    versus unstable angina pectoris. 2. History of congestive heart failure, compensated. 3. Arteriosclerotic vascular disease status post coronary artery bypass graft    in 1998. 4. Left bundle branch block, old. 5. History of transient ischemic attacks. 6. Hyponatremia probably due to Vaseretic. 7. Hypercholesterolemia. 8. Ongoing tobacco abuse.  PLAN:  The patient will be admitted to telemetry to rule out myocardial infarction with serial cardiac enzymes.  We will also repeat a B-MET in the morning to follow up on his hyponatremia.  He may  need an adjustment in his Vaseretic dose.  We will consider adenosine Cardiolite study if his enzymes are negative. DD:  01/30/00 TD:  01/31/00 Job: [id number]

## 2010-11-18 NOTE — Consult Note (Signed)
[name], DARLEY NO.:  [id number]   MEDICAL RECORD NO.:  [id number]          PATIENT TYPE:  INP   LOCATION:  2115                         FACILITY:  [facility]   PHYSICIAN:  Roddie Mc B. Vanstory, M.D.DATE OF BIRTH:  10-12-1935   DATE OF CONSULTATION:  02/06/2005  DATE OF DISCHARGE:                                   CONSULTATION   CHIEF COMPLAINT:  Shortness of breath and widened QRS.   HISTORY OF PRESENT ILLNESS:  This is a 75 year old male with a past medical  history of CAD status post CABG in 1998, CHF, with an EF of 15%, ischemic  cardiomyopathy, A-flutter, COPD oxygen dependent, who presented to the ED  with progressive shortness of breath and cough, positive exertional dyspnea,  negative sputum production, was intubated.  He was found to have a right  lower lobe pneumonia and was found to have a wide complex tachycardia.   PAST MEDICAL HISTORY:  1.  Ischemic cardiomyopathy, catheterization in March 2006, with left main,      left circumflex, and LAD disease, ejection fraction of less than 15%.  2.  History of wide complex tachycardia and SVT.  3.  Status post ICD placement biventricular upgrade in March 2006 for      recurrent SVT.  4.  History of A-fib on chronic Coumadin.  5.  COPD on home O2, noncompliant.  6.  Dyslipidemia.   HOME MEDICATIONS:  Digitek 0.125 mg p.o. daily, Furosemide 40 mg p.o.  b.i.d., Coreg 6.25 mg p.o. b.i.d., Zocor 20 mg p.o. daily, Coumadin 5 mg  p.o. daily, aspirin 81 mg daily, Spiriva 18 mg daily, Pulmicort 0.25,  Xopenex 1.25 mg q.8h., Nitro patch 0.2 mg p.o. daily.   HOSPITAL MEDICATIONS:  Maxipime 1 gram IV q.12h., Azithromycin 500 mg  q.24h., Solu-Medrol IV 40 q.12h.   FAMILY HISTORY:  Brother with hypertension, brother with carotid artery  stenosis.   SOCIAL HISTORY:  Three pack per day smoker, quit one year ago.   PHYSICAL EXAMINATION:  VITAL SIGNS:  Temperature 100, blood pressure 132/62, heart rate 115,  respirations 12, 98% on pressure support.  GENERAL:  The patient is sedated but arousable.  HEENT:  Orally intubated, right pupil irregular.  CVS:  Patient tachycardic.  LUNGS:  Decreased air movement, positive abdominal effort to breathe.  ABDOMEN:  Mild distention.  EXTREMITIES:      No edema, 2+ peripheral pulses bilaterally.   LABORATORY DATA:  BNP 601, troponin 0.08, sodium 134.  Blood gas  7.088/92.9/342/28.  Potassium 4.6.  Chest x-ray right basilar pneumonia and  cardiomegaly.   75 year old male with complex past medical history:   1.  Wide QRS tachycardia.  The patient's rate 115 but complexes have      narrowed.  Patient with history of wide complexes, could be secondary to      a pacemaker or MI, will continue to check cardiac enzymes, will check a      digoxin level, will also follow up wide complexes, will also have the      device interrogated whether the pacemaker is responding or  malfunctioning.  2.  Ischemic heart disease with cardiomyopathy, patient cathed in March      2006, EF 15%, continue medical management.  3.  A-fib.  Patient on Coumadin, INR 2.7, patient also on digoxin, dig level      pending, stable but is continued tachycardic, consider rate control.  4.  Vent dependent respiratory failure secondary to pneumonia, cultures      sent, continue antibiotics.  5.  COPD, steroids and bronchodilators.       MBV/MEDQ  D:  02/06/2005  T:  02/06/2005  Job:  [id number]

## 2010-11-18 NOTE — H&P (Signed)
[name], [name] NO.:  [id number]   MEDICAL RECORD NO.:  [id number]          PATIENT TYPE:  INP   LOCATION:  1824                         FACILITY:  [facility]   PHYSICIAN:  Theressa Millard, M.D.    DATE OF BIRTH:  01/22/1936   DATE OF ADMISSION:  07/20/2004  DATE OF DISCHARGE:                                HISTORY & PHYSICAL   Mr. Schum is a 75 year old white male admitted with near respiratory  failure.   He has a history of MI and congestive cardiomyopathy with an EF of about  15%.  In the fall of 2005, he was found to have colon cancer.  He was to  have an adenosine Cardiolite but was too short of breath to undergo that and  was referred to Dr. Marcelyn Bruins for further evaluation of a pulmonary  problem.  He has been seen by Dr. Shelle Iron or his PA three times in the last  few months.  He states that he has been given some metered dose inhalers  which he has not been using regularly.  He has been given prednisone x2 and  most recently he was given Mucinex and Spiriva two days ago.  Today, he  awoke and went to the bathroom.  After sitting down, he was suddenly very  short of breath and called 911.  In the emergency department, he was very  short of breath and BIPAP was started. He was given Lasix, albuterol  nebulizer and Solu-Medrol 125 mg IV.  He was able to stop the BIPAP after  albuterol.  He is admitted for further evaluation.   PAST MEDICAL HISTORY:  1.  Surgical:  Colon cancer surgery in the fall of 2005.  CABG, 1998. Lumbar      surgery x2.  2.  Medical:  Hypercholesterolemia, COPD, TIA's.   MEDICATIONS:  1.  Altace 10 mg b.i.d.  2.  Coreg 6.25 mg b.i.d.  3.  Coumadin 5 mg daily.  4.  Zocor 20 mg daily.  5.  Furosemide 40 mg daily.  6.  Doxicycline 100 mg daily.  7.  Spiriva 1 inhalation daily.   SOCIAL HISTORY:  He continues to smoke one pack per day.  He does not  consume alcohol.  He is married and retired.   FAMILY HISTORY:  Father died  age 17 with congestive heart failure.  Mother  died of stroke following a hysterectomy.  Hypertension.   REVIEW OF SYMPTOMS:  All other systems are negative.   PHYSICAL EXAMINATION:  GENERAL:  He appears comfortable.  VITAL SIGNS:  Blood pressure 114/58, pulse 84 and regular, respiratory rate  12.  HEENT:  Pupils are round and reactive to light, extraocular movements  intact.  Funduscopic exam is unremarkable. Ears are normal, nose and throat  are unremarkable.  CHEST:  Clear to auscultation and percussion except for decreased breath  sounds.  CARDIAC:  Regular rhythm and rate with a normal S1 and S2 without an S3, S4,  murmur, rub or click.  ABDOMEN:  Soft and nontender with normal bowel sounds without  hepatosplenomegaly or mass.  EXTREMITIES:  Without cyanosis, clubbing or edema.   Chest x-ray reveals COPD with no acute changes and no vascular changes.   LABORATORY DATA:  DIG level less than 0.2.  CK-MB is 7.6 and 7.4.  BNP is  mildly elevated at 371.  Myoglobin is 162 on admission and was 274 several  hours later.  CBC is normal, CMET is normal.   EKG showed sinus rhythm with a wide complex.  No acute changes. There is an  old inferior MI and an old anterior MI.   IMPRESSION:  1.  Acute shortness of breath.  The symptoms reminded patient of acute      congestive heart failure which he has had in the remote past but chest x-      ray and BNP are not consistent with severe congestive heart failure.      Response to albuterol makes me suspect chronic obstructive pulmonary      disease exacerbation.  I will treat him with Solu-Medrol and nebs.      Continue Spiriva and doxicycline.  I will ask pulmonary to see the      patient.  I do note there was a bump in his myoglobin so there may be      some cardiac issues as well.  The patient already on Coumadin so PE is      unlikely.  2.  Coronary artery disease.  3.  Chronic obstructive pulmonary disease.  4.  Colon  cancer.      JO/MEDQ  D:  07/20/2004  T:  07/20/2004  Job:  [id number]

## 2010-11-18 NOTE — Discharge Summary (Signed)
NAME:  [name], [name]                         ACCOUNT NO.:  [id number]   MEDICAL RECORD NO.:  [id number]                   PATIENT TYPE:  INP   LOCATION:  5715                                 FACILITY:  [facility]   PHYSICIAN:  Thornton Park. Daphine Deutscher, M.D.             DATE OF BIRTH:  1935-08-29   DATE OF ADMISSION:  03/08/2004  DATE OF DISCHARGE:  03/17/2004                                 DISCHARGE SUMMARY   ADMISSION DIAGNOSIS:  Carcinoma of the left colon.   DISCHARGE DIAGNOSIS:  A 5 cm, moderately differentiated, invasive  adenocarcinoma with 14 negative nodes and negative margins, T3 N0 MX.   HOSPITAL COURSE:  Mr. Macpherson was an admission one night preop with bowel  prep.  He was taken to the OR on September 7, and underwent a laparoscopic  assisted left hemicolectomy.  He did very well.  He was kept in stepdown for  a short time.  He was also seen by Dr. Marcelyn Bruins, but overall did very  well.  He progressed and was eating, having bowel movements.  He was  tolerating a regular diet by postop day #8, at which time he was discharged.   DISCHARGE MEDICATIONS:  Vicodin for pain.   FOLLOW UP:  He was requested to come back to the office in 3-4 weeks.   WOUND CARE:  His staples were removed and his incision was healing nicely.   CONDITION ON DISCHARGE:  Good.                                                Thornton Park Daphine Deutscher, M.D.    MBM/MEDQ  D:  03/17/2004  T:  03/17/2004  Job:  [id number]   cc:   Theressa Millard, M.D.  [address] [place]  [place] [address]  Fax: [phone]   Lyn Records III, M.D.  [address]  [address]  [place]  [place] [address]  Fax: [phone]

## 2010-11-18 NOTE — Op Note (Signed)
NAME:  [name], [name]                         ACCOUNT NO.:  [id number]   MEDICAL RECORD NO.:  [id number]                   PATIENT TYPE:  AMB   LOCATION:  ENDO                                 [facility]AC[facility]   PHYSICIAN:  [name], M.D.                DATE OF BIRTH:  04/18/1936   DATE OF PROCEDURE:  01/07/2004  DATE OF DISCHARGE:                                 OPERATIVE REPORT   PROCEDURE:  Colonoscopy.   INDICATIONS FOR PROCEDURE:  [name] is a 75 year old male born 22-Sep-1935.  [name] has unexplained iron deficiency anemia with guaiac  positive stool.   [name] has congestive cardiomyopathy and has undergone coronary artery  bypass grafting in the past.  He chronically takes Coumadin in addition to  his other medications which include Zocor, Vaseretic, digoxin and Coreg.   In preparation for his diagnostic colonoscopy, [name] stopped Coumadin  approximately seven days ago.   ENDOSCOPIST:  [name], M.D.   PREMEDICATION:  Versed 6 mg, Demerol 50 mg.   DESCRIPTION OF PROCEDURE:  After obtaining informed consent, [name]  was placed in the left lateral decubitus position. I administered  intravenous Demerol and intravenous Versed to achieve conscious sedation for  the procedure. The patient's blood pressure, oxygen saturation and cardiac  rhythm were monitored throughout the procedure and documented in the medical  record.   Anal inspection and digital rectal exam were normal. The prostate was non-  nodular. The Olympus adjustable pediatric colonoscope was introduced into  the rectum and advanced to the cecum. Colonic preparation for the exam today  was excellent.   RECTUM:  From the distal rectum, a 2 mm sessile polyp was removed with the  electrocautery snare.   SIGMOID COLON AND DESCENDING COLON:  At approximately 50 cm from the anal  verge in the distal descending colon, there is a circumferential cancer  partially  obstructing the colon.  Multiple biopsies were performed.  The  distal aspect of the cancer was injected with [place] ink.  In addition, Mr.  Bradley has extensive left colonic diverticulosis.   SPLENIC FLEXURE:  Normal.   TRANSVERSE COLON:  From the distal transverse colon, a 3 mm sessile polyp  was removed with the electrocautery snare.  From the proximal transverse  colon, a 5-7 mm sessile polyp was lifted by submucosal saline injection and  removed with the electrocautery snare. Both polyps were submitted in one  bottle for pathological evaluation.   HEPATIC FLEXURE:  Normal.   ASCENDING COLON:  Normal.   CECUM AND ILEOCECAL VALVE:  Normal.   ASSESSMENT:  1. Two polyps were removed from the transverse colon and submitted in one     bottle for pathological evaluation.  2. From the distal descending colon, biopsies were taken from a     circumferential cancer.  The cancer is located at 50 cm from the  anal     verge. The distal aspect of the cancer was marked with [place] ink.  3. Extensive left colonic diverticulosis.  4. From the distal rectum, a 2 mm sessile polyp was removed with     electrocautery snare.   PLAN:  I will need to review the transverse colon polyps prior to sending  [name] to surgery.  He will remain off Coumadin until he undergoes  surgery to remove at least the left colon cancer.                                               [name], M.D.    MJ/MEDQ  D:  01/07/2004  T:  01/07/2004  Job:  [id number]   cc:   [name], M.D.  [address]  [address]  [place]  [place] [address]  Fax: [phone]   [name], M.D.  [address] [place]  [place] [address]  Fax: [phone]

## 2010-11-18 NOTE — Assessment & Plan Note (Signed)
[facility]                             PULMONARY OFFICE NOTE   NAME:[name], [name]                      MRN:          [id number]  DATE:08/24/2006                            DOB:          02-03-1936    HISTORY OF PRESENT ILLNESS:  The patient is a 75 year old white male  patient of Dr. Teddy Spike who has a known history of end stage severe  emphysematous COPD.  Present for a 2 day history of productive cough  with thick green sputum, nasal congestion, and wheezing.  Patient denies  any hemoptysis, orthopnea, PND, or leg swelling.  Patient is maintained  on Xopenex and Pulmicort nebulizer, and prednisone 10 mg.   PAST MEDICAL HISTORY:  Reviewed.   CURRENT MEDICATIONS:  Reviewed.   PHYSICAL EXAMINATION:  The patient is a chronically-ill appearing male  in no acute distress.  He is afebrile with stable vital signs.  O2  saturation is 93% on room air.  HEENT:  Unremarkable.  NECK:  Supple without adenopathy.  No JVD.  LUNGS:  Sounds reveal diminished breath sounds at bases, otherwise  clear.  CARDIAC:  Regular rate.  ABDOMEN:  Soft and nontender.  EXTREMITIES:  Warm without any edema.   IMPRESSION/PLAN:  Acute chronic obstructive pulmonary disease flare.  Patient is to begin Avelox 400mg  for 7 days.  Continue on Mucinex twice  daily.  The patient may use Tussionex as needed for cough control.  The  patient is to return back with Dr. Shelle Iron as scheduled or sooner if  needed.      Rubye Oaks, NP  Electronically Signed      Barbaraann Share, MD,FCCP  Electronically Signed   TP/MedQ  DD: 08/24/2006  DT: 08/24/2006  Job #: [id number]

## 2010-11-23 ENCOUNTER — Encounter: Payer: Self-pay | Admitting: Internal Medicine

## 2010-11-29 ENCOUNTER — Ambulatory Visit (INDEPENDENT_AMBULATORY_CARE_PROVIDER_SITE_OTHER): Payer: Medicare Other | Admitting: Internal Medicine

## 2010-11-29 ENCOUNTER — Encounter: Payer: Self-pay | Admitting: Internal Medicine

## 2010-11-29 DIAGNOSIS — I4891 Unspecified atrial fibrillation: Secondary | ICD-10-CM

## 2010-11-29 DIAGNOSIS — I472 Ventricular tachycardia, unspecified: Secondary | ICD-10-CM

## 2010-11-29 DIAGNOSIS — I509 Heart failure, unspecified: Secondary | ICD-10-CM

## 2010-11-29 DIAGNOSIS — Z95810 Presence of automatic (implantable) cardiac defibrillator: Secondary | ICD-10-CM

## 2010-11-29 DIAGNOSIS — I2589 Other forms of chronic ischemic heart disease: Secondary | ICD-10-CM

## 2010-11-29 NOTE — Patient Instructions (Signed)
Your physician wants you to follow-up in: 12 months with Dr. Taylor. You will receive a reminder letter in the mail two months in advance. If you don't receive a letter, please call our office to schedule the follow-up appointment.    

## 2010-11-29 NOTE — Assessment & Plan Note (Signed)
His device is working normally. We'll recheck in several months. 

## 2010-11-29 NOTE — Progress Notes (Signed)
HPI [name] returns for followup. He is a pleasant 75 year old man with an ischemic cardiomyopathy, congestive heart failure, status post ICD implantation. The patient denies chest pain or shortness of breath. 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 He denies chest pain, shortness of breath, or peripheral edema. He has had no ICD shocks. No Known Allergies   Current Outpatient Prescriptions  Medication Sig Dispense Refill  . albuterol (PROVENTIL HFA) 108 (90 BASE) MCG/ACT inhaler Inhale 1-2 puffs into the lungs every 6 (six) hours as needed.        Marland Kitchen albuterol (PROVENTIL) (2.5 MG/3ML) 0.083% nebulizer solution Take 2.5 mg by nebulization every 6 (six) hours as needed.        Marland Kitchen amLODipine (NORVASC) 5 MG tablet Take 5 mg by mouth daily.        . budesonide (PULMICORT) 0.5 MG/2ML nebulizer solution Take 0.5 mg by nebulization 2 (two) times daily.        . candesartan (ATACAND) 32 MG tablet Take 32 mg by mouth daily.        . carvedilol (COREG) 12.5 MG tablet Take 12.5 mg by mouth 2 (two) times daily with a meal.        . digoxin (LANOXIN) 0.125 MG tablet Take 125 mcg by mouth daily.        . furosemide (LASIX) 40 MG tablet Take 2 tabs by mouth in the morning and 1 tab in the evening       . guaiFENesin (MUCINEX) 600 MG 12 hr tablet Take 1,200 mg by mouth 2 (two) times daily.        Marland Kitchen HYDROcodone-acetaminophen (VICODIN) 2.5-500 MG per tablet Take 1 tablet by mouth every 6 (six) hours as needed.        . nitroGLYCERIN (NITRODUR - DOSED IN MG/24 HR) 0.2 mg/hr Place 1 patch onto the skin daily.        . nitroGLYCERIN (NITROSTAT) 0.4 MG SL tablet Place 0.4 mg under the tongue every 5 (five) minutes as needed. May repeat x3       . potassium chloride SA (K-DUR,KLOR-CON) 20 MEQ tablet Take 20 mEq by mouth daily.        . predniSONE (DELTASONE) 10 MG tablet TAKE ONE (1) TABLET(S) ONCE DAILY  30 tablet  4  . simvastatin (ZOCOR) 20 MG tablet  Take 20 mg by mouth at bedtime.        Marland Kitchen tiotropium (SPIRIVA) 18 MCG inhalation capsule Place 18 mcg into inhaler and inhale daily.        Marland Kitchen warfarin (COUMADIN) 2 MG tablet as directed.           Past Medical History  Diagnosis Date  . Hypokalemia   . Colon cancer   . PVD (peripheral vascular disease)   . HTN (hypertension)   . Automatic implantable cardiac defibrillator in situ   . CHF (congestive heart failure)   . Ventricular tachycardia   . Ischemic heart disease, chronic   . Dyslipidemia   . CAD (coronary artery disease)   . COPD (chronic obstructive pulmonary disease)   . Emphysema     ROS:   All systems reviewed and negative except as noted in the HPI.   Past Surgical History  Procedure Date  . Lumbar back surgery     x2  . Cholecystectomy   . Iliac artery stent     left  . Implantation of medtronic concerto bi-v  icd 04-10-08    [name] MD  . Colonoscopy   . Polypectomy      Family History  Problem Relation Age of Onset  . Heart failure Father      History   Social History  . Marital Status: Married    Spouse Name: N/A    Number of Children: N/A  . Years of Education: N/A   Occupational History  . Not on file.   Social History Main Topics  . Smoking status: Former Smoker -- 3.0 packs/day for 48 years    Types: Cigarettes    Quit date: 07/03/2002  . Smokeless tobacco: Not on file  . Alcohol Use: No  . Drug Use: No  . Sexually Active: Not on file   Other Topics Concern  . Not on file   Social History Narrative  . No narrative on file     BP 116/67  Pulse 89  Resp 16  Ht 5\' 7"  (1.702 m)  Wt 189 lb (85.73 kg)  BMI 29.60 kg/m2  Physical Exam:  Well appearing NAD HEENT: Unremarkable Neck:  No JVD, no thyromegally Lymphatics:  No adenopathy Back:  No CVA tenderness Lungs:  Clear. Well-healed ICD incision. HEART:  Regular rate rhythm, no murmurs, no rubs, no clicks Abd:  Flat, positive bowel sounds, no organomegally, no  rebound, no guarding Ext:  2 plus pulses, no edema, no cyanosis, no clubbing Skin:  No rashes no nodules Neuro:  CN II through XII intact, motor grossly intact  DEVICE  Normal device function.  See PaceArt for details.   Assess/Plan:

## 2010-11-29 NOTE — Assessment & Plan Note (Signed)
He appears to be asymptomatic. He is currently rate controlled. He will continue his current medications.

## 2010-11-29 NOTE — Assessment & Plan Note (Signed)
He denies anginal symptoms. He will continue his current medications. 

## 2010-12-02 ENCOUNTER — Inpatient Hospital Stay (HOSPITAL_COMMUNITY)
Admission: EM | Admit: 2010-12-02 | Discharge: 2010-12-05 | DRG: 689 | Disposition: A | Discharge status: Home / Self Care | Payer: Medicare Other | Attending: Internal Medicine | Admitting: Internal Medicine

## 2010-12-02 ENCOUNTER — Emergency Department (HOSPITAL_COMMUNITY): Payer: Medicare Other

## 2010-12-02 DIAGNOSIS — I509 Heart failure, unspecified: Secondary | ICD-10-CM | POA: Diagnosis present

## 2010-12-02 DIAGNOSIS — A498 Other bacterial infections of unspecified site: Secondary | ICD-10-CM | POA: Diagnosis present

## 2010-12-02 DIAGNOSIS — I739 Peripheral vascular disease, unspecified: Secondary | ICD-10-CM | POA: Diagnosis present

## 2010-12-02 DIAGNOSIS — N390 Urinary tract infection, site not specified: Principal | ICD-10-CM | POA: Diagnosis present

## 2010-12-02 DIAGNOSIS — J449 Chronic obstructive pulmonary disease, unspecified: Secondary | ICD-10-CM | POA: Diagnosis present

## 2010-12-02 DIAGNOSIS — J4489 Other specified chronic obstructive pulmonary disease: Secondary | ICD-10-CM | POA: Diagnosis present

## 2010-12-02 DIAGNOSIS — I2589 Other forms of chronic ischemic heart disease: Secondary | ICD-10-CM | POA: Diagnosis present

## 2010-12-02 DIAGNOSIS — I5022 Chronic systolic (congestive) heart failure: Secondary | ICD-10-CM | POA: Diagnosis present

## 2010-12-02 DIAGNOSIS — I10 Essential (primary) hypertension: Secondary | ICD-10-CM | POA: Diagnosis present

## 2010-12-02 DIAGNOSIS — Z951 Presence of aortocoronary bypass graft: Secondary | ICD-10-CM

## 2010-12-02 DIAGNOSIS — J189 Pneumonia, unspecified organism: Secondary | ICD-10-CM | POA: Diagnosis present

## 2010-12-02 DIAGNOSIS — I2510 Atherosclerotic heart disease of native coronary artery without angina pectoris: Secondary | ICD-10-CM | POA: Diagnosis present

## 2010-12-02 DIAGNOSIS — I4891 Unspecified atrial fibrillation: Secondary | ICD-10-CM | POA: Diagnosis present

## 2010-12-02 DIAGNOSIS — Z95810 Presence of automatic (implantable) cardiac defibrillator: Secondary | ICD-10-CM

## 2010-12-02 LAB — POCT I-STAT 3, ART BLOOD GAS (G3+)
Acid-Base Excess: 6 mmol/L — ABNORMAL HIGH (ref 0.0–2.0)
Bicarbonate: 32.3 mEq/L — ABNORMAL HIGH (ref 20.0–24.0)
O2 Saturation: 99 %
Patient temperature: 98.7
TCO2: 34 mmol/L (ref 0–100)
pCO2 arterial: 52 mmHg — ABNORMAL HIGH (ref 35.0–45.0)
pH, Arterial: 7.402 (ref 7.350–7.450)
pO2, Arterial: 151 mmHg — ABNORMAL HIGH (ref 80.0–100.0)

## 2010-12-02 LAB — CBC
HCT: 34.1 % — ABNORMAL LOW (ref 39.0–52.0)
Hemoglobin: 11 g/dL — ABNORMAL LOW (ref 13.0–17.0)
RBC: 3.93 MIL/uL — ABNORMAL LOW (ref 4.22–5.81)
WBC: 14.8 10*3/uL — ABNORMAL HIGH (ref 4.0–10.5)

## 2010-12-02 LAB — PROCALCITONIN: Procalcitonin: 0.11 ng/mL

## 2010-12-02 LAB — PROTIME-INR
INR: 3.42 — ABNORMAL HIGH (ref 0.00–1.49)
Prothrombin Time: 34.5 seconds — ABNORMAL HIGH (ref 11.6–15.2)

## 2010-12-02 LAB — BASIC METABOLIC PANEL
BUN: 14 mg/dL (ref 6–23)
Calcium: 8.9 mg/dL (ref 8.4–10.5)
Creatinine, Ser: 0.87 mg/dL (ref 0.4–1.5)
GFR calc Af Amer: >60 mL/min (ref >60)

## 2010-12-02 LAB — DIFFERENTIAL
Basophils Absolute: 0 10*3/uL (ref 0.0–0.1)
Basophils Relative: 0 % (ref 0–1)
Eosinophils Absolute: 0.1 10*3/uL (ref 0.0–0.7)
Eosinophils Relative: 1 % (ref 0–5)
Lymphocytes Relative: 5 % — ABNORMAL LOW (ref 12–46)
Lymphs Abs: 0.7 10*3/uL (ref 0.7–4.0)
Monocytes Absolute: 0.5 10*3/uL (ref 0.1–1.0)
Monocytes Relative: 3 % (ref 3–12)
Neutro Abs: 13.5 10*3/uL — ABNORMAL HIGH (ref 1.7–7.7)
Neutrophils Relative %: 92 % — ABNORMAL HIGH (ref 43–77)

## 2010-12-02 LAB — PRO B NATRIURETIC PEPTIDE: Pro B Natriuretic peptide (BNP): 547.7 pg/mL — ABNORMAL HIGH (ref 0–125)

## 2010-12-02 LAB — URINALYSIS, ROUTINE W REFLEX MICROSCOPIC
Ketones, ur: NEGATIVE mg/dL
Protein, ur: 30 mg/dL — AB
Urobilinogen, UA: 0.2 mg/dL (ref 0.0–1.0)

## 2010-12-02 LAB — TROPONIN I: Troponin I: <0.3 ng/mL (ref <0.30)

## 2010-12-02 LAB — CK TOTAL AND CKMB (NOT AT ARMC)
CK, MB: 3 ng/mL (ref 0.3–4.0)
Total CK: 53 U/L (ref 7–232)

## 2010-12-02 LAB — URINE MICROSCOPIC-ADD ON

## 2010-12-02 LAB — APTT: aPTT: 38 seconds — ABNORMAL HIGH (ref 24–37)

## 2010-12-02 LAB — LACTIC ACID, PLASMA: Lactic Acid, Venous: 1.4 mmol/L (ref 0.5–2.2)

## 2010-12-02 NOTE — H&P (Signed)
NAMERAMAJ, FRANGOS NO.:  [id number]  MEDICAL RECORD NO.:  [id number]           PATIENT TYPE:  E  LOCATION:  [facility]                         FACILITY:  [facility]  PHYSICIAN:  Houston Siren, MD           DATE OF BIRTH:  03/21/1936  DATE OF ADMISSION:  12/02/2010 DATE OF DISCHARGE:                             HISTORY & PHYSICAL   PRIMARY CARE PHYSICIAN:  Theressa Millard, MD  ADVANCE DIRECTIVE:  Full code.  REASON FOR ADMISSION:  Community-acquired pneumonia and urinary tract infection.  HISTORY OF PRESENT ILLNESS:  This is a 75 year old pleasant male with history of coronary artery disease, status post prior coronary artery bypass graft, ischemic cardiomyopathy with ejection fraction of 15%, NYHA heart class 3, status post ICD placement, history of ventricular tachycardia and persistent atrial fibrillation, severe peripheral vascular disease status post several stents, prior CVAs, in his usual state of health, presents to the emergency room with chills, rigor, and acute shortness of breath.  He denied any chest pain, orthopnea, PND, or leg swelling.  There has been minimal nonproductive cough.  Evaluation in the emergency room showed a temperature of 101.9 with leukocytosis of 14,500.  He has a hemoglobin of 11 g/dL.  His chest x-ray showed multifocal pneumonia, and his urinalysis is positive with nitrite, too numerous to count wbc's, and a few bacteria.  His cardiac markers were negative and his lactic acid was okay.  ABG shows 7.40/52, pAO2 151, and his BNP is 547.  His EKG showed paced rhythm.  Hospitalist was asked to admit the patient for pneumonia, community acquired and urinary tract infection.  PAST MEDICAL HISTORY:  As above.  SOCIAL HISTORY:  He denied tobacco, alcohol, or drug use.  He is a retired Teaching laboratory technician.  He is married, with children.  REVIEW OF SYSTEMS:  Otherwise unremarkable.  He is normally quite functional and has no problem with  his cardiac condition.  ALLERGIES:  No known drug allergies.  CURRENT MEDICATIONS:  Spiriva, simvastatin, Pulmicort, Proventil, prednisone daily, potassium supplement, Norvasc, nitroglycerin, Mucinex, Lasix 40 mg 1-2 tablets b.i.d., digoxin 0.125 mg per day, Coumadin, carvedilol 12.5 mg b.i.d., Atacand, and albuterol treatment.  FAMILY HISTORY:  Noncontributory.  PHYSICAL EXAMINATION:  VITAL SIGNS:  Blood pressure is 110/43, pulse of 93, and respiratory rate of 28. GENERAL:  He appears comfortable and in no apparent distress.  He is alert and oriented and conversing, uses no accessory muscle. HEENT:  Sclerae are nonicteric.  He had no stridor.  No JVD. CHEST:  Defibrillates on his left upper chest. LUNGS:  Clear.  No wheezes or crackles. ABDOMEN:  Soft, nondistended, and nontender. EXTREMITIES:  No edema. His foot is warm. SKIN:  Warm and dry. NEUROLOGICAL/PSYCHIATRIC:  Unremarkable as well.  OBJECTIVE FINDINGS:  As above.  IMPRESSION:  This is a 75 year old with cardiac and pulmonary problems as above, presents with community-acquired pneumonia, multifocal, along with a urinary tract infection.  He is not in congestive heart failure, and he has no evidence of acute coronary ischemia.  We will admit him to Dr. Newell Coral service.  He  is stable, full code, and I have resumed all his medications.  We will give Rocephin and Zithromax which should cover both pulmonary and urinary sources.  As per prior arrangement, Dr. Newell Coral or colleague will resume his care this morning.     Houston Siren, MD     PL/MEDQ  D:  12/02/2010  T:  12/02/2010  Job:  [id number]  Electronically Signed by Houston Siren  on 12/02/2010 10:20:28 PM

## 2010-12-03 LAB — CBC
HCT: 30.1 % — ABNORMAL LOW (ref 39.0–52.0)
Hemoglobin: 9.7 g/dL — ABNORMAL LOW (ref 13.0–17.0)
MCH: 27.9 pg (ref 26.0–34.0)
MCHC: 32.2 g/dL (ref 30.0–36.0)
MCV: 86.5 fL (ref 78.0–100.0)
Platelets: 149 10*3/uL — ABNORMAL LOW (ref 150–400)
RBC: 3.48 MIL/uL — ABNORMAL LOW (ref 4.22–5.81)
RDW: 16.1 % — ABNORMAL HIGH (ref 11.5–15.5)
WBC: 14.1 10*3/uL — ABNORMAL HIGH (ref 4.0–10.5)

## 2010-12-03 LAB — BASIC METABOLIC PANEL
BUN: 23 mg/dL (ref 6–23)
CO2: 33 mEq/L — ABNORMAL HIGH (ref 19–32)
GFR calc non Af Amer: >60 mL/min (ref >60)
Glucose, Bld: 129 mg/dL — ABNORMAL HIGH (ref 70–99)
Potassium: 4.1 mEq/L (ref 3.5–5.1)
Sodium: 135 mEq/L (ref 135–145)

## 2010-12-03 LAB — PROTIME-INR
INR: 2.81 — ABNORMAL HIGH (ref 0.00–1.49)
Prothrombin Time: 29.7 seconds — ABNORMAL HIGH (ref 11.6–15.2)

## 2010-12-03 LAB — CARDIAC PANEL(CRET KIN+CKTOT+MB+TROPI)
Relative Index: INVALID (ref 0.0–2.5)
Total CK: 49 U/L (ref 7–232)
Troponin I: <0.3 ng/mL (ref <0.30)

## 2010-12-03 LAB — HEMOGLOBIN A1C
Hgb A1c MFr Bld: 7.5 % — ABNORMAL HIGH (ref <5.7)
Mean Plasma Glucose: 169 mg/dL — ABNORMAL HIGH (ref <117)

## 2010-12-04 LAB — CBC
HCT: 29.6 % — ABNORMAL LOW (ref 39.0–52.0)
Hemoglobin: 9.4 g/dL — ABNORMAL LOW (ref 13.0–17.0)
MCH: 27.2 pg (ref 26.0–34.0)
MCHC: 31.8 g/dL (ref 30.0–36.0)
MCV: 85.8 fL (ref 78.0–100.0)
Platelets: 148 10*3/uL — ABNORMAL LOW (ref 150–400)
RBC: 3.45 MIL/uL — ABNORMAL LOW (ref 4.22–5.81)
RDW: 16 % — ABNORMAL HIGH (ref 11.5–15.5)
WBC: 9.9 10*3/uL (ref 4.0–10.5)

## 2010-12-04 LAB — BASIC METABOLIC PANEL
BUN: 30 mg/dL — ABNORMAL HIGH (ref 6–23)
CO2: 31 mEq/L (ref 19–32)
Calcium: 8.5 mg/dL (ref 8.4–10.5)
Chloride: 97 mEq/L (ref 96–112)
Creatinine, Ser: 1.28 mg/dL (ref 0.4–1.5)
GFR calc Af Amer: >60 mL/min (ref >60)
GFR calc non Af Amer: 55 mL/min — ABNORMAL LOW (ref >60)
Glucose, Bld: 154 mg/dL — ABNORMAL HIGH (ref 70–99)
Potassium: 4.1 mEq/L (ref 3.5–5.1)
Sodium: 135 mEq/L (ref 135–145)

## 2010-12-04 LAB — URINE CULTURE
Colony Count: >=100000
Culture  Setup Time: 201206010854

## 2010-12-04 LAB — PROTIME-INR
INR: 1.91 — ABNORMAL HIGH (ref 0.00–1.49)
Prothrombin Time: 22 seconds — ABNORMAL HIGH (ref 11.6–15.2)

## 2010-12-05 LAB — BASIC METABOLIC PANEL
BUN: 23 mg/dL (ref 6–23)
CO2: 30 mEq/L (ref 19–32)
Calcium: 8.8 mg/dL (ref 8.4–10.5)
Chloride: 96 mEq/L (ref 96–112)
Creatinine, Ser: 0.89 mg/dL (ref 0.4–1.5)
GFR calc Af Amer: >60 mL/min (ref >60)
GFR calc non Af Amer: >60 mL/min (ref >60)
Glucose, Bld: 105 mg/dL — ABNORMAL HIGH (ref 70–99)
Potassium: 4.3 mEq/L (ref 3.5–5.1)
Sodium: 134 mEq/L — ABNORMAL LOW (ref 135–145)

## 2010-12-05 LAB — PROTIME-INR
INR: 2.03 — ABNORMAL HIGH (ref 0.00–1.49)
Prothrombin Time: 23.1 seconds — ABNORMAL HIGH (ref 11.6–15.2)

## 2010-12-08 LAB — CULTURE, BLOOD (ROUTINE X 2)
Culture  Setup Time: 201206010841
Culture: NO GROWTH
Culture: NO GROWTH

## 2010-12-09 NOTE — Discharge Summary (Signed)
  [name], GUDERIAN NO.:  [id number]  MEDICAL RECORD NO.:  [id number]  LOCATION:  3714                         FACILITY:  [facility]  PHYSICIAN:  Theressa Millard, M.D.    DATE OF BIRTH:  1935/07/18  DATE OF ADMISSION:  12/02/2010 DATE OF DISCHARGE:  12/05/2010                              DISCHARGE SUMMARY   ADMITTING DIAGNOSIS:  Pneumonia.  DISCHARGE DIAGNOSES: 1. Pneumonia, community-acquired. 2. Escherichia coli urinary tract infection. 3. Stable systolic congestive heart failure with left ventricular     ejection fraction less than 20% and automatic implantable     cardioverter defibrillator implanted. 4. Atrial fibrillation. 5. Hypertension. 6. Chronic obstructive pulmonary disease. 7. Peripheral vascular disease.  HISTORY OF PRESENT ILLNESS:  The patient is a 75 year old white male with a complicated past history from a cardiac point of view.  He developed fever and acute shortness of breath on the early morning hours of December 02, 2010 and reported to the emergency room immediately.  HOSPITAL COURSE:  The patient was admitted after a chest x-ray revealed evidence of pneumonia.  This seemed to be mainly in the right basal area more than the left.  He was also found to have evidence of urinary tract infection.  Urinary tract infection eventually grew out E-coli.  He was placed on Rocephin and azithromycin and at admission this was changed to Levaquin. INR was a little supratherapeutic at admission but after 1 dose of Coumadin was held became slightly subtherapeutic and doses of Coumadin were given and his INR was improving.  He improved considerably, was able to ambulate without assistance using his chronic oxygen therapy, and he was discharged in improved condition.  DISCHARGE MEDICATIONS: 1. Levaquin 500 mg once daily for 3 days. 2. Albuterol nebulizer 1 vial 4 times a day p.r.n. 3. Atacand 32 mg daily. 4. Carvedilol 12.5 mg twice daily. 5.  Warfarin 2.5 mg 1 tablet daily except 1-1/2 tablets on Friday. 6. Lanoxin 0.125 mg daily. 7. Furosemide 40 mg 2 tablets in the morning and one in the evening. 8. Mucinex 600 mg 2 tablets daily. 9. Nitroglycerin patch 0.2 mg/hour applied daily. 10.Norvasc 5 mg daily. 11.Potassium chloride 20 mEq daily. 12.Prednisone 10 mg daily. 13.Proventil inhaler 2 puffs q.i.d. p.r.n. 14.Pulmicort nebulizer 1 vial twice daily. 15.Simvastatin 20 mg daily. 16.Spiriva 1 puff daily.  FOLLOWUP:  He has an appointment to see our pharmacist for Pro-time on December 09, 2010.  He also has an appointment with me on January 05, 2011 which he will keep and we will follow up chest x-ray at that time.  ACTIVITY:  As tolerated.  DIET:  No added salt.     Theressa Millard, M.D.     JO/MEDQ  D:  12/05/2010  T:  12/05/2010  Job:  [id number]  Electronically Signed by Theressa Millard M.D. on 12/09/2010 11:54:49 AM

## 2010-12-11 ENCOUNTER — Emergency Department (HOSPITAL_COMMUNITY)
Admission: EM | Admit: 2010-12-11 | Discharge: 2010-12-11 | Disposition: A | Discharge status: Home / Self Care | Payer: Medicare Other | Attending: Emergency Medicine | Admitting: Emergency Medicine

## 2010-12-11 ENCOUNTER — Emergency Department (HOSPITAL_COMMUNITY): Payer: Medicare Other

## 2010-12-11 DIAGNOSIS — Z7901 Long term (current) use of anticoagulants: Secondary | ICD-10-CM | POA: Insufficient documentation

## 2010-12-11 DIAGNOSIS — T82897A Other specified complication of cardiac prosthetic devices, implants and grafts, initial encounter: Secondary | ICD-10-CM | POA: Insufficient documentation

## 2010-12-11 DIAGNOSIS — J449 Chronic obstructive pulmonary disease, unspecified: Secondary | ICD-10-CM | POA: Insufficient documentation

## 2010-12-11 DIAGNOSIS — Z79899 Other long term (current) drug therapy: Secondary | ICD-10-CM | POA: Insufficient documentation

## 2010-12-11 DIAGNOSIS — R110 Nausea: Secondary | ICD-10-CM | POA: Insufficient documentation

## 2010-12-11 DIAGNOSIS — Z85038 Personal history of other malignant neoplasm of large intestine: Secondary | ICD-10-CM | POA: Insufficient documentation

## 2010-12-11 DIAGNOSIS — Z951 Presence of aortocoronary bypass graft: Secondary | ICD-10-CM | POA: Insufficient documentation

## 2010-12-11 DIAGNOSIS — R609 Edema, unspecified: Secondary | ICD-10-CM | POA: Insufficient documentation

## 2010-12-11 DIAGNOSIS — I10 Essential (primary) hypertension: Secondary | ICD-10-CM | POA: Insufficient documentation

## 2010-12-11 DIAGNOSIS — J4489 Other specified chronic obstructive pulmonary disease: Secondary | ICD-10-CM | POA: Insufficient documentation

## 2010-12-11 DIAGNOSIS — I472 Ventricular tachycardia, unspecified: Secondary | ICD-10-CM | POA: Insufficient documentation

## 2010-12-11 DIAGNOSIS — R05 Cough: Secondary | ICD-10-CM | POA: Insufficient documentation

## 2010-12-11 DIAGNOSIS — R0989 Other specified symptoms and signs involving the circulatory and respiratory systems: Secondary | ICD-10-CM | POA: Insufficient documentation

## 2010-12-11 DIAGNOSIS — I4891 Unspecified atrial fibrillation: Secondary | ICD-10-CM | POA: Insufficient documentation

## 2010-12-11 DIAGNOSIS — I4729 Other ventricular tachycardia: Secondary | ICD-10-CM | POA: Insufficient documentation

## 2010-12-11 DIAGNOSIS — Y838 Other surgical procedures as the cause of abnormal reaction of the patient, or of later complication, without mention of misadventure at the time of the procedure: Secondary | ICD-10-CM | POA: Insufficient documentation

## 2010-12-11 DIAGNOSIS — R059 Cough, unspecified: Secondary | ICD-10-CM | POA: Insufficient documentation

## 2010-12-11 DIAGNOSIS — R062 Wheezing: Secondary | ICD-10-CM | POA: Insufficient documentation

## 2010-12-11 DIAGNOSIS — R0609 Other forms of dyspnea: Secondary | ICD-10-CM | POA: Insufficient documentation

## 2010-12-11 DIAGNOSIS — I2510 Atherosclerotic heart disease of native coronary artery without angina pectoris: Secondary | ICD-10-CM | POA: Insufficient documentation

## 2010-12-11 LAB — CBC
HCT: 33 % — ABNORMAL LOW (ref 39.0–52.0)
MCHC: 32.1 g/dL (ref 30.0–36.0)
Platelets: 252 10*3/uL (ref 150–400)
RDW: 15.8 % — ABNORMAL HIGH (ref 11.5–15.5)
WBC: 11.5 10*3/uL — ABNORMAL HIGH (ref 4.0–10.5)

## 2010-12-11 LAB — DIFFERENTIAL
Basophils Absolute: 0.1 10*3/uL (ref 0.0–0.1)
Basophils Relative: 0 % (ref 0–1)
Eosinophils Absolute: 0.2 10*3/uL (ref 0.0–0.7)
Eosinophils Relative: 1 % (ref 0–5)
Lymphocytes Relative: 12 % (ref 12–46)
Monocytes Absolute: 1 10*3/uL (ref 0.1–1.0)

## 2010-12-11 LAB — BASIC METABOLIC PANEL
BUN: 15 mg/dL (ref 6–23)
Chloride: 94 mEq/L — ABNORMAL LOW (ref 96–112)
Creatinine, Ser: 0.9 mg/dL (ref 0.4–1.5)
GFR calc Af Amer: >60 mL/min (ref >60)
GFR calc non Af Amer: >60 mL/min (ref >60)
Potassium: 5.3 mEq/L — ABNORMAL HIGH (ref 3.5–5.1)

## 2010-12-11 LAB — PROTIME-INR
INR: 1.96 — ABNORMAL HIGH (ref 0.00–1.49)
Prothrombin Time: 22.5 seconds — ABNORMAL HIGH (ref 11.6–15.2)

## 2010-12-16 ENCOUNTER — Ambulatory Visit: Payer: Medicare Other

## 2010-12-22 ENCOUNTER — Ambulatory Visit (INDEPENDENT_AMBULATORY_CARE_PROVIDER_SITE_OTHER): Payer: Medicare Other | Admitting: *Deleted

## 2010-12-22 DIAGNOSIS — I4891 Unspecified atrial fibrillation: Secondary | ICD-10-CM

## 2010-12-22 NOTE — Progress Notes (Signed)
Icd checked in office./carelink transmission scheduled for 03/02/11.

## 2010-12-23 ENCOUNTER — Other Ambulatory Visit: Payer: Self-pay | Admitting: *Deleted

## 2010-12-23 ENCOUNTER — Telehealth: Payer: Self-pay | Admitting: Pulmonary Disease

## 2010-12-23 MED ORDER — ALBUTEROL SULFATE HFA 108 (90 BASE) MCG/ACT IN AERS: 1.0000 | INHALATION_SPRAY | Freq: Four times a day (QID) | RESPIRATORY_TRACT | Status: DC | PRN

## 2010-12-23 MED ORDER — FUROSEMIDE 40 MG PO TABS: ORAL_TABLET | ORAL | Status: DC

## 2010-12-23 NOTE — Telephone Encounter (Signed)
Pt dropped of refill requests for Furosemide and Proventil HFA.  KC signed rx and they were [facility]ed [facility].

## 2010-12-23 NOTE — Telephone Encounter (Signed)
Pt aware [facility]

## 2011-01-02 ENCOUNTER — Encounter (INDEPENDENT_AMBULATORY_CARE_PROVIDER_SITE_OTHER): Payer: Medicare Other

## 2011-01-02 ENCOUNTER — Ambulatory Visit: Payer: Medicare Other | Admitting: Surgery

## 2011-01-02 DIAGNOSIS — I70219 Atherosclerosis of native arteries of extremities with intermittent claudication, unspecified extremity: Secondary | ICD-10-CM

## 2011-01-02 DIAGNOSIS — Z48812 Encounter for surgical aftercare following surgery on the circulatory system: Secondary | ICD-10-CM

## 2011-01-23 ENCOUNTER — Ambulatory Visit: Payer: Self-pay | Admitting: Pulmonary Disease

## 2011-01-23 ENCOUNTER — Emergency Department (HOSPITAL_COMMUNITY)
Admission: EM | Admit: 2011-01-23 | Discharge: 2011-01-23 | Disposition: A | Discharge status: Home / Self Care | Payer: Medicare Other | Attending: Emergency Medicine | Admitting: Emergency Medicine

## 2011-01-23 ENCOUNTER — Emergency Department (HOSPITAL_COMMUNITY): Payer: Medicare Other

## 2011-01-23 ENCOUNTER — Ambulatory Visit: Payer: Medicare Other | Admitting: Surgery

## 2011-01-23 DIAGNOSIS — Z95810 Presence of automatic (implantable) cardiac defibrillator: Secondary | ICD-10-CM | POA: Insufficient documentation

## 2011-01-23 DIAGNOSIS — W1809XA Striking against other object with subsequent fall, initial encounter: Secondary | ICD-10-CM | POA: Insufficient documentation

## 2011-01-23 DIAGNOSIS — I10 Essential (primary) hypertension: Secondary | ICD-10-CM | POA: Insufficient documentation

## 2011-01-23 DIAGNOSIS — Z7901 Long term (current) use of anticoagulants: Secondary | ICD-10-CM | POA: Insufficient documentation

## 2011-01-23 DIAGNOSIS — I6529 Occlusion and stenosis of unspecified carotid artery: Secondary | ICD-10-CM | POA: Insufficient documentation

## 2011-01-23 DIAGNOSIS — S0003XA Contusion of scalp, initial encounter: Secondary | ICD-10-CM | POA: Insufficient documentation

## 2011-01-23 DIAGNOSIS — S0083XA Contusion of other part of head, initial encounter: Secondary | ICD-10-CM | POA: Insufficient documentation

## 2011-01-23 DIAGNOSIS — Y92009 Unspecified place in unspecified non-institutional (private) residence as the place of occurrence of the external cause: Secondary | ICD-10-CM | POA: Insufficient documentation

## 2011-01-23 DIAGNOSIS — Z85038 Personal history of other malignant neoplasm of large intestine: Secondary | ICD-10-CM | POA: Insufficient documentation

## 2011-01-23 DIAGNOSIS — I25810 Atherosclerosis of coronary artery bypass graft(s) without angina pectoris: Secondary | ICD-10-CM | POA: Insufficient documentation

## 2011-01-23 DIAGNOSIS — S139XXA Sprain of joints and ligaments of unspecified parts of neck, initial encounter: Secondary | ICD-10-CM | POA: Insufficient documentation

## 2011-01-23 DIAGNOSIS — S20219A Contusion of unspecified front wall of thorax, initial encounter: Secondary | ICD-10-CM | POA: Insufficient documentation

## 2011-01-23 DIAGNOSIS — I658 Occlusion and stenosis of other precerebral arteries: Secondary | ICD-10-CM | POA: Insufficient documentation

## 2011-01-23 LAB — PROTIME-INR: Prothrombin Time: 28.4 seconds — ABNORMAL HIGH (ref 11.6–15.2)

## 2011-02-01 ENCOUNTER — Ambulatory Visit (INDEPENDENT_AMBULATORY_CARE_PROVIDER_SITE_OTHER): Payer: Medicare Other | Admitting: Pulmonary Disease

## 2011-02-01 ENCOUNTER — Encounter: Payer: Self-pay | Admitting: Pulmonary Disease

## 2011-02-01 VITALS — BP 140/62 | HR 68 | Temp 98.0°F | Ht 67.0 in | Wt 185.8 lb

## 2011-02-01 DIAGNOSIS — J449 Chronic obstructive pulmonary disease, unspecified: Secondary | ICD-10-CM

## 2011-02-01 NOTE — Progress Notes (Signed)
  Subjective:    Patient ID: [name], male    DOB: January 03, 1936, 75 y.o.   MRN: [id number]  HPI The pt comes in today for f/u of his known severe emphysema.  He also has known cardiomyopathy.  He had an acute exacerbation in May of this year, but returned to baseline with the usual therapy.  Currently, denies cough, congestion, or mucus.  Feels his exertional tolerance has returned to his usual level.    Review of Systems  Constitutional: Negative for fever and unexpected weight change.  HENT: Negative for ear pain, nosebleeds, congestion, sore throat, rhinorrhea, sneezing, trouble swallowing, dental problem, postnasal drip and sinus pressure.   Eyes: Negative for redness and itching.  Respiratory: Negative for cough, chest tightness, shortness of breath and wheezing.   Cardiovascular: Negative for palpitations and leg swelling.  Gastrointestinal: Negative for nausea and vomiting.  Genitourinary: Negative for dysuria.  Musculoskeletal: Negative for joint swelling.  Skin: Negative for rash.  Neurological: Negative for headaches.  Hematological: Bruises/bleeds easily.  Psychiatric/Behavioral: Negative for dysphoric mood. The patient is not nervous/anxious.        Objective:   Physical Exam Wd male in nad Nares without purulence or discharge Chest with decreased bs, no wheezing.  No evidence for right effusion.  Cor with ?irreg LE with mild edema, no cyanosis Alert, oriented, moves all 4       Assessment & Plan:

## 2011-02-01 NOTE — Patient Instructions (Signed)
No change in meds Stay as active as you can followup with me in 6mos if doing well.

## 2011-02-06 NOTE — Assessment & Plan Note (Addendum)
The pt has returned to baseline after a copd exacerbation in May of this year.  He denies any worsening cough or congestion, and has gotten back to his usual activity.  I have asked him to stay on his current BD regimen, and to f/u with me in 6mos and prn.  I have also asked him to stay on oxygen at [facility] and with heavier exertional activities.  He does not need at rest.

## 2011-02-08 NOTE — Procedures (Unsigned)
AORTA-ILIAC DUPLEX EVALUATION  INDICATION:  Bilateral iliac stents.  HISTORY: Diabetes:  No Cardiac:  No Hypertension:  Yes Smoking:  Previously Previous Surgery:  Left common iliac artery stent on 04/16/2008; left common iliac and right proximal external iliac artery stents on 08/10/2010.              SINGLE LEVEL ARTERIAL EXAM                             RIGHT                  LEFT Brachial:                  128                    152 Anterior tibial:           Not detected           108 Posterior tibial:          65                     82 Peroneal:                  69 Ankle/brachial index:      0.45                   0.71 Previous ABI/date:         09/13/2010 at 0.45     09/12/2010 at 0.65  AORTA-ILIAC DUPLEX EXAM Aorta - Proximal     66 Cm/s Aorta - Mid          Not visualized Aorta - Distal       Not visualized  RIGHT                                   LEFT Not visualized    CIA-PROXIMAL          Not visualized Not visualized    CIA-DISTAL            Not visualized Not visualized    HYPOGASTRIC           Not visualized 185 cm/s          EIA-PROXIMAL          Not visualized 207 cm/s          EIA-MID               121 cm/s 197 cm/s          EIA-DISTAL            79 cm/s  IMPRESSION:  Unable to adequately visualize the distal abdominal aorta and bilateral common iliac arteries due to overlying bowel gas pattern and patient body habitus.  Patient bilateral external iliac arteries noted with velocities as described above.  The bilateral ankle brachial indices and monophasic Doppler waveforms suggest moderately decreased perfusion of the bilateral lower extremities.  No significant change noted in the bilateral ankle brachial indices when compared to the previous exam.  ___________________________________________ V. Charlena Cross, MD  CH/MEDQ  D:  01/02/2011  T:  01/02/2011  Job:  [id number]

## 2011-03-02 ENCOUNTER — Ambulatory Visit (INDEPENDENT_AMBULATORY_CARE_PROVIDER_SITE_OTHER): Payer: Medicare Other | Admitting: *Deleted

## 2011-03-02 DIAGNOSIS — I509 Heart failure, unspecified: Secondary | ICD-10-CM

## 2011-03-02 DIAGNOSIS — I428 Other cardiomyopathies: Secondary | ICD-10-CM

## 2011-03-03 ENCOUNTER — Encounter: Payer: Self-pay | Admitting: Internal Medicine

## 2011-03-03 ENCOUNTER — Other Ambulatory Visit: Payer: Self-pay | Admitting: Internal Medicine

## 2011-03-03 LAB — REMOTE ICD DEVICE
AL AMPLITUDE: 1.5 mv
BATTERY VOLTAGE: 3.0102 V
BRDY-0004LV: 110 {beats}/min
FVT: 0
LV LEAD IMPEDENCE ICD: 627 Ohm
LV LEAD THRESHOLD: 1 V
TOT-0006: 20091009000000
TZAT-0001ATACH: 1
TZAT-0001ATACH: 3
TZAT-0004SLOWVT: 8
TZAT-0004SLOWVT: 8
TZAT-0005FASTVT: 88 pct
TZAT-0005SLOWVT: 91 pct
TZAT-0011FASTVT: 10 ms
TZAT-0011SLOWVT: 10 ms
TZAT-0011SLOWVT: 10 ms
TZAT-0012ATACH: 150 ms
TZAT-0012ATACH: 150 ms
TZAT-0012SLOWVT: 200 ms
TZAT-0012SLOWVT: 200 ms
TZAT-0013FASTVT: 3
TZAT-0013SLOWVT: 2
TZAT-0013SLOWVT: 3
TZAT-0018ATACH: NEGATIVE
TZAT-0018ATACH: NEGATIVE
TZAT-0018FASTVT: NEGATIVE
TZAT-0020ATACH: 1.5 ms
TZAT-0020ATACH: 1.5 ms
TZAT-0020SLOWVT: 1.5 ms
TZAT-0020SLOWVT: 1.5 ms
TZON-0003ATACH: 350 ms
TZON-0003FASTVT: 300 ms
TZON-0003SLOWVT: 430 ms
TZON-0004SLOWVT: 36
TZST-0001ATACH: 4
TZST-0001ATACH: 6
TZST-0001FASTVT: 3
TZST-0001FASTVT: 5
TZST-0001SLOWVT: 4
TZST-0001SLOWVT: 6
TZST-0002ATACH: NEGATIVE
TZST-0002ATACH: NEGATIVE
TZST-0003FASTVT: 35 J
TZST-0003SLOWVT: 35 J
TZST-0003SLOWVT: 35 J
VENTRICULAR PACING ICD: 97.86 pct
VF: 0

## 2011-03-15 ENCOUNTER — Encounter: Payer: Self-pay | Admitting: Surgery

## 2011-03-15 ENCOUNTER — Encounter: Payer: Self-pay | Admitting: *Deleted

## 2011-03-16 NOTE — Progress Notes (Signed)
ICD/ICM checked by remote. 

## 2011-03-20 ENCOUNTER — Ambulatory Visit: Payer: Medicare Other | Admitting: Surgery

## 2011-04-03 LAB — LIPID PANEL
HDL: 51
LDL Cholesterol: 50
LDL Cholesterol: 56
Total CHOL/HDL Ratio: 3.1
Triglycerides: 43
Triglycerides: 78
VLDL: 16

## 2011-04-03 LAB — POCT I-STAT, CHEM 8
BUN: 19
Chloride: 101
Creatinine, Ser: 1
Potassium: 4.4
Sodium: 136
TCO2: 28

## 2011-04-03 LAB — PROTIME-INR
INR: 2.7 — ABNORMAL HIGH
Prothrombin Time: 29.1 — ABNORMAL HIGH
Prothrombin Time: 30.5 — ABNORMAL HIGH
Prothrombin Time: 30.7 — ABNORMAL HIGH

## 2011-04-03 LAB — CBC
HCT: 29.7 — ABNORMAL LOW
HCT: 30.6 — ABNORMAL LOW
HCT: 32.1 — ABNORMAL LOW
Hemoglobin: 10.1 — ABNORMAL LOW
Hemoglobin: 10.6 — ABNORMAL LOW
Hemoglobin: 10.6 — ABNORMAL LOW
MCHC: 32.4
MCHC: 32.9
MCHC: 33
MCHC: 33
MCHC: 33
MCV: 82.5
MCV: 83.4
MCV: 83.4
MCV: 83.9
Platelets: 194
Platelets: 216
Platelets: 228
RBC: 3.7 — ABNORMAL LOW
RBC: 3.71 — ABNORMAL LOW
RBC: 3.89 — ABNORMAL LOW
RDW: 16.7 — ABNORMAL HIGH
RDW: 17.2 — ABNORMAL HIGH
RDW: 17.4 — ABNORMAL HIGH
RDW: 17.5 — ABNORMAL HIGH
WBC: 8.1

## 2011-04-03 LAB — COMPREHENSIVE METABOLIC PANEL
ALT: 29
ALT: 37
AST: 38 — ABNORMAL HIGH
Alkaline Phosphatase: 73
CO2: 27
CO2: 29
Calcium: 8.5
Chloride: 97
Creatinine, Ser: 0.96
GFR calc Af Amer: >60
GFR calc non Af Amer: >60
GFR calc non Af Amer: >60
Glucose, Bld: 100 — ABNORMAL HIGH
Glucose, Bld: 146 — ABNORMAL HIGH
Potassium: 3.9
Sodium: 132 — ABNORMAL LOW
Sodium: 137
Total Protein: 6.7

## 2011-04-03 LAB — DIFFERENTIAL
Eosinophils Absolute: 0
Lymphocytes Relative: 4 — ABNORMAL LOW
Lymphs Abs: 0.4 — ABNORMAL LOW
Monocytes Relative: 3
Neutro Abs: 8.1 — ABNORMAL HIGH
Neutrophils Relative %: 93 — ABNORMAL HIGH

## 2011-04-03 LAB — AMIODARONE LEVEL
Amiodarone Lvl: 0.83 ug/mL (ref 0.50–2.00)
N-Desethyl-Amiodarone: 1 ug/mL (ref 0.25–3.50)

## 2011-04-03 LAB — MAGNESIUM: Magnesium: 2.2

## 2011-04-03 LAB — CK TOTAL AND CKMB (NOT AT ARMC)
CK, MB: 4.6 — ABNORMAL HIGH
Relative Index: INVALID
Relative Index: INVALID

## 2011-04-03 LAB — APTT: aPTT: 35

## 2011-04-03 LAB — TROPONIN I
Troponin I: 0.03
Troponin I: 0.04

## 2011-04-06 ENCOUNTER — Other Ambulatory Visit: Payer: Self-pay | Admitting: Pulmonary Disease

## 2011-04-19 LAB — PROTIME-INR
INR: 1.1
Prothrombin Time: 14.9
Prothrombin Time: 20.8 — ABNORMAL HIGH

## 2011-04-19 LAB — DIFFERENTIAL
Basophils Absolute: 0
Basophils Relative: 0
Eosinophils Absolute: 0.1
Monocytes Absolute: 1.2 — ABNORMAL HIGH
Neutro Abs: 7.4
Neutrophils Relative %: 71

## 2011-04-19 LAB — URINALYSIS, ROUTINE W REFLEX MICROSCOPIC
Glucose, UA: NEGATIVE
Ketones, ur: NEGATIVE
Leukocytes, UA: NEGATIVE
Nitrite: NEGATIVE
Protein, ur: NEGATIVE
Protein, ur: NEGATIVE
Specific Gravity, Urine: 1.015 (ref 1.005–1.035)
pH: 5.5

## 2011-04-19 LAB — BASIC METABOLIC PANEL
CO2: 32
Calcium: 7.9 — ABNORMAL LOW
Chloride: 104
Creatinine, Ser: 0.93
GFR calc Af Amer: >60
GFR calc Af Amer: >60
GFR calc non Af Amer: >60
Glucose, Bld: 166 — ABNORMAL HIGH
Potassium: 3.2 — ABNORMAL LOW
Potassium: 3.5
Potassium: 5
Sodium: 136
Sodium: 140
Sodium: 141

## 2011-04-19 LAB — URINE CULTURE

## 2011-04-19 LAB — COMPREHENSIVE METABOLIC PANEL
ALT: 54 — ABNORMAL HIGH
AST: 107 — ABNORMAL HIGH
Albumin: 2.5 — ABNORMAL LOW
Albumin: 2.6 — ABNORMAL LOW
Alkaline Phosphatase: 77
BUN: 10
Calcium: 8.3 — ABNORMAL LOW
Chloride: 99
GFR calc Af Amer: 53 — ABNORMAL LOW
Glucose, Bld: 101 — ABNORMAL HIGH
Potassium: 4
Potassium: 4.7
Sodium: 137
Total Bilirubin: 0.5
Total Protein: 5.9 — ABNORMAL LOW

## 2011-04-19 LAB — CBC
HCT: 26.5 — ABNORMAL LOW
Hemoglobin: 8.9 — ABNORMAL LOW
MCHC: 33.3
RDW: 15.4 — ABNORMAL HIGH
WBC: 10.4

## 2011-04-19 LAB — TSH: TSH: 0.12 — ABNORMAL LOW

## 2011-04-19 LAB — CARDIAC PANEL(CRET KIN+CKTOT+MB+TROPI): Total CK: 2338 — ABNORMAL HIGH

## 2011-04-20 LAB — PROTIME-INR: INR: 1.4

## 2011-04-20 LAB — POCT I-STAT 4, (NA,K, GLUC, HGB,HCT)
Operator id: 147011
Potassium: 4.1
Sodium: 133 — ABNORMAL LOW

## 2011-04-20 LAB — CBC
HCT: 36.3 — ABNORMAL LOW
Hemoglobin: 12.2 — ABNORMAL LOW
RBC: 4.13 — ABNORMAL LOW
RDW: 15.3 — ABNORMAL HIGH
WBC: 11.8 — ABNORMAL HIGH

## 2011-04-20 LAB — BASIC METABOLIC PANEL
GFR calc Af Amer: >60
GFR calc non Af Amer: >60
Glucose, Bld: 110 — ABNORMAL HIGH
Potassium: 4.2
Sodium: 136

## 2011-04-20 LAB — TYPE AND SCREEN
ABO/RH(D): O POS
Antibody Screen: NEGATIVE

## 2011-04-20 LAB — APTT: aPTT: 29

## 2011-04-20 LAB — ABO/RH: ABO/RH(D): O POS

## 2011-05-01 ENCOUNTER — Other Ambulatory Visit (INDEPENDENT_AMBULATORY_CARE_PROVIDER_SITE_OTHER): Payer: Medicare Other | Admitting: *Deleted

## 2011-05-01 ENCOUNTER — Ambulatory Visit: Payer: Medicare Other | Admitting: Surgery

## 2011-05-01 ENCOUNTER — Encounter: Payer: Self-pay | Admitting: Pulmonary Disease

## 2011-05-01 ENCOUNTER — Ambulatory Visit (INDEPENDENT_AMBULATORY_CARE_PROVIDER_SITE_OTHER): Payer: Medicare Other | Admitting: *Deleted

## 2011-05-01 ENCOUNTER — Ambulatory Visit (INDEPENDENT_AMBULATORY_CARE_PROVIDER_SITE_OTHER): Payer: Medicare Other | Admitting: Pulmonary Disease

## 2011-05-01 VITALS — BP 142/80 | HR 84 | Temp 98.2°F | Ht 67.0 in | Wt 185.4 lb

## 2011-05-01 DIAGNOSIS — J449 Chronic obstructive pulmonary disease, unspecified: Secondary | ICD-10-CM

## 2011-05-01 DIAGNOSIS — Z48812 Encounter for surgical aftercare following surgery on the circulatory system: Secondary | ICD-10-CM

## 2011-05-01 DIAGNOSIS — I70219 Atherosclerosis of native arteries of extremities with intermittent claudication, unspecified extremity: Secondary | ICD-10-CM

## 2011-05-01 NOTE — Patient Instructions (Signed)
Will send information regarding your oxygen Let us know if you feel your breathing is not getting back to your usual baseline followup with me at your previously scheduled apptm.

## 2011-05-01 NOTE — Assessment & Plan Note (Signed)
The patient feels that his dyspnea on exertion is a little more than his usual baseline, but not a lot.  He is not having any symptoms suggestive of an acute exacerbation, and denies any chest congestion or purulence.  I would like to hold off on further changes to his medication regimen and see how he does over the next few weeks.  He is to call us if he feels he is not getting better, and certainly if he is worsening.  Of note, the patient was recertified for his oxygen today in the office.

## 2011-05-01 NOTE — Progress Notes (Signed)
  Subjective:    Patient ID: [name], male    DOB: 15-May-1936, 75 y.o.   MRN: [id number]  HPI The patient comes in today for followup of his known COPD with chronic respiratory failure.  Overall, he is not far from his usual baseline, but does feel that he has a little more dyspnea on exertion than usual.  He has not had any significant chest congestion, cough, or purulent mucus.  His weight is stable, and he only has mild ankle edema today.  He has not had a recent acute exacerbation or chest cold.   Review of Systems  Constitutional: Negative for fever and unexpected weight change.  HENT: Positive for rhinorrhea. Negative for ear pain, nosebleeds, congestion, sore throat, sneezing, trouble swallowing, dental problem, postnasal drip and sinus pressure.   Eyes: Negative for redness and itching.  Respiratory: Positive for cough, shortness of breath and wheezing. Negative for chest tightness.   Cardiovascular: Negative for palpitations and leg swelling.  Gastrointestinal: Negative for nausea and vomiting.  Genitourinary: Negative for dysuria.  Musculoskeletal: Negative for joint swelling.  Skin: Negative for rash.  Neurological: Negative for headaches.  Hematological: Bruises/bleeds easily.  Psychiatric/Behavioral: Negative for dysphoric mood. The patient is not nervous/anxious.        Objective:   Physical Exam Overweight male in no acute distress Nose without purulence or discharge noted Chest with decreased breath sounds, a few basilar crackles, no wheezes or rhonchi Cardiac exam with regular rate and rhythm Lower extremities with mild ankle edema, no cyanosis noted Alert and oriented, moves all 4 extremities.       Assessment & Plan:

## 2011-05-05 ENCOUNTER — Encounter: Payer: Self-pay | Admitting: Surgery

## 2011-05-08 ENCOUNTER — Encounter: Payer: Self-pay | Admitting: Surgery

## 2011-05-08 ENCOUNTER — Ambulatory Visit (INDEPENDENT_AMBULATORY_CARE_PROVIDER_SITE_OTHER): Payer: Medicare Other | Admitting: Surgery

## 2011-05-08 VITALS — BP 120/65 | HR 88 | Ht 67.0 in | Wt 182.0 lb

## 2011-05-08 DIAGNOSIS — I70219 Atherosclerosis of native arteries of extremities with intermittent claudication, unspecified extremity: Secondary | ICD-10-CM

## 2011-05-08 NOTE — Progress Notes (Signed)
[facility] [place]   Patient name: [name] MRN: [id number] DOB: Feb 21, 1936 Sex: male     Chief Complaint  Patient presents with  . PVD    3 month follow up, had vascular labs on 05/01/11    HISTORY OF PRESENT ILLNESS: The patient comes back today for followup. He is status post left common iliac stenting with a Genesis 8 x 24 balloon-expandable stent and right external iliac stenting using an 8 x 18 balloon-expandable stent. This was done for bilateral claudication. His symptoms have resolved. He also has known diffuse right superficial femoral artery disease with three-vessel runoff. He is back today for continued ultrasound followup. He does not endorse any new symptoms of claudication. He states that he will case I get cramps in his legs when he wakes up in the morning. Exercise does not bring about any new symptoms. He denies nonhealing wounds or ulceration.  Past Medical History  Diagnosis Date  . Hypokalemia   . Colon cancer   . PVD (peripheral vascular disease)   . HTN (hypertension)   . Automatic implantable cardiac defibrillator in situ   . CHF (congestive heart failure)   . Ventricular tachycardia   . Ischemic heart disease, chronic   . Dyslipidemia   . CAD (coronary artery disease)   . COPD (chronic obstructive pulmonary disease)   . Emphysema   . On home oxygen therapy     Past Surgical History  Procedure Date  . Lumbar back surgery     x2  . Cholecystectomy   . Iliac artery stent     left  . Implantation of medtronic concerto bi-v icd 04-10-08    [name] MD  . Colonoscopy   . Polypectomy     History   Social History  . Marital Status: Married    Spouse Name: N/A    Number of Children: N/A  . Years of Education: N/A   Occupational History  . Not on file.   Social History Main Topics  . Smoking status: Former Smoker -- 3.0 packs/day for 48 years    Types: Cigarettes    Quit date: 07/03/2002  . Smokeless tobacco:  Not on file  . Alcohol Use: No  . Drug Use: No  . Sexually Active: Not on file   Other Topics Concern  . Not on file   Social History Narrative  . No narrative on file    Family History  Problem Relation Age of Onset  . Heart failure Father     Allergies as of 05/08/2011  . (No Known Allergies)    Current Outpatient Prescriptions on File Prior to Visit  Medication Sig Dispense Refill  . albuterol (PROVENTIL HFA) 108 (90 BASE) MCG/ACT inhaler Inhale 1-2 puffs into the lungs every 6 (six) hours as needed.  3 Inhaler  3  . albuterol (PROVENTIL) (2.5 MG/3ML) 0.083% nebulizer solution Take 2.5 mg by nebulization 4 (four) times daily.       Marland Kitchen amLODipine (NORVASC) 5 MG tablet Take 5 mg by mouth daily.        . budesonide (PULMICORT) 0.5 MG/2ML nebulizer solution Take 0.5 mg by nebulization 2 (two) times daily.        . candesartan (ATACAND) 32 MG tablet Take 32 mg by mouth daily.        . carvedilol (COREG) 12.5 MG tablet Take 12.5 mg by mouth 2 (two) times daily with a meal.        .  digoxin (LANOXIN) 0.125 MG tablet Take 125 mcg by mouth daily.        . furosemide (LASIX) 40 MG tablet Take 2 tabs by mouth in the morning and 1 tab in the evening  270 tablet  3  . guaiFENesin (MUCINEX) 600 MG 12 hr tablet Take 1,200 mg by mouth 2 (two) times daily.        Marland Kitchen HYDROcodone-acetaminophen (VICODIN) 2.5-500 MG per tablet Take 1 tablet by mouth every 6 (six) hours as needed.        . nitroGLYCERIN (NITRODUR - DOSED IN MG/24 HR) 0.2 mg/hr Place 1 patch onto the skin daily.        . nitroGLYCERIN (NITROSTAT) 0.4 MG SL tablet Place 0.4 mg under the tongue every 5 (five) minutes as needed. May repeat x3       . potassium chloride SA (K-DUR,KLOR-CON) 20 MEQ tablet Take 20 mEq by mouth daily.        . predniSONE (DELTASONE) 10 MG tablet TAKE ONE TABLET BY MOUTH ONE TIME DAILY  30 tablet  4  . simvastatin (ZOCOR) 20 MG tablet Take 20 mg by mouth at bedtime.        Marland Kitchen tiotropium (SPIRIVA) 18 MCG  inhalation capsule Place 18 mcg into inhaler and inhale daily.        Marland Kitchen warfarin (COUMADIN) 2 MG tablet Take 2.5 mg by mouth daily. Name Bran Coumadin Only!!!         REVIEW OF SYSTEMS: Negative for claudication negative for chest pain negative for shortness of breath  PHYSICAL EXAMINATION: General: The patient appears their stated age.  Vital signs are BP 120/65  Pulse 88  Ht 5\' 7"  (1.702 m)  Wt 182 lb (82.555 kg)  BMI 28.51 kg/m2  SpO2 92% Pulmonary: There is a good air exchange  Abdomen: Soft and non-tender  Musculoskeletal: There are no major deformities.  There is no significant extremity pain. Neurologic: No focal weakness or paresthesias are detected, Skin: There are no ulcer or rashes noted. Psychiatric: The patient has normal affect. Cardiovascular: There is a regular rate and rhythm without significant murmur appreciated. Pedal pulses are not palpable   Diagnostic Studies Ultrasound was ordered and reviewed today this shows no significant change in his ankle brachial indices bilaterally. The right ABI is 0.51 previously it was 0.45. The left ABI is 0.73. Previously it was 0.71.  Assessment: Bilateral claudication Plan: Currently the patient is asymptomatic following his bilateral iliac intervention he will be continued on our ultrasound surveillance protocol. He'll be followed up in the [facility] clinic in one year and I will see him back in 2 years. He'll contact me sooner if he does develop recurrent symptoms.  Jorge Ny, M.D. [facility] [place] Office: [phone]

## 2011-05-09 NOTE — Procedures (Unsigned)
AORTA-ILIAC DUPLEX EVALUATION  INDICATION:  Bilateral iliac artery stents.  HISTORY: Diabetes:  No. Cardiac:  No. Hypertension:  Yes. Smoking:  Previous. Previous Surgery:  Left common iliac artery stent on 04/16/2008.  Left common iliac artery and right proximal external iliac artery stents on 08/10/2010.              SINGLE LEVEL ARTERIAL EXAM                             RIGHT                  LEFT Brachial: Anterior tibial: Posterior tibial: Peroneal: Ankle/brachial index: Previous ABI/date:  AORTA-ILIAC DUPLEX EXAM Aorta - Proximal     84 cm/s Aorta - Mid          82 cm/s Aorta - Distal       Not visualized  RIGHT                                   LEFT Not visualized    CIA-PROXIMAL          Not visualized Not visualized    CIA-DISTAL            Not visualized Not visualized    HYPOGASTRIC           Not visualized 189 cm/s          EIA-PROXIMAL          108 cm/s 173 cm/s          EIA-MID               94 cm/s 184 cm/s          EIA-DISTAL            145 cm/s  IMPRESSION: 1. The distal abdominal aorta and bilateral common iliac arteries were     not adequately visualized due to overlying bowel gas patterns. 2. Biphasic right external iliac artery and monophasic left external     iliac artery Doppler waveforms noted, which suggests patent     bilateral common iliac arteries.  Velocities of the bilateral     external iliac arteries are as described above. 3. Velocities of 272 cm/s and 365 cm/s noted in the right proximal     profunda femoral and left proximal superficial femoral arteries,     respectively. 4. Bilateral ankle brachial indices are noted on a separate report.  ___________________________________________ V. Charlena Cross, MD  CH/MEDQ  D:  05/01/2011  T:  05/01/2011  Job:  [id number]

## 2011-06-01 ENCOUNTER — Encounter: Payer: Self-pay | Admitting: Internal Medicine

## 2011-06-01 ENCOUNTER — Ambulatory Visit (INDEPENDENT_AMBULATORY_CARE_PROVIDER_SITE_OTHER): Payer: Medicare Other | Admitting: *Deleted

## 2011-06-01 ENCOUNTER — Other Ambulatory Visit: Payer: Self-pay | Admitting: Internal Medicine

## 2011-06-01 DIAGNOSIS — Z95810 Presence of automatic (implantable) cardiac defibrillator: Secondary | ICD-10-CM

## 2011-06-01 DIAGNOSIS — I472 Ventricular tachycardia: Secondary | ICD-10-CM

## 2011-06-01 DIAGNOSIS — I509 Heart failure, unspecified: Secondary | ICD-10-CM

## 2011-06-01 DIAGNOSIS — I4891 Unspecified atrial fibrillation: Secondary | ICD-10-CM

## 2011-06-02 LAB — REMOTE ICD DEVICE
AL AMPLITUDE: 0.9 mv
ATRIAL PACING ICD: 70.54 pct
BAMS-0001: 170 {beats}/min
BRDY-0002LV: 60 {beats}/min
FVT: 0
PACEART VT: 0
RV LEAD AMPLITUDE: 15.1 mv
RV LEAD IMPEDENCE ICD: 475 Ohm
TZAT-0001ATACH: 1
TZAT-0001ATACH: 3
TZAT-0001SLOWVT: 1
TZAT-0001SLOWVT: 2
TZAT-0002ATACH: NEGATIVE
TZAT-0002ATACH: NEGATIVE
TZAT-0002ATACH: NEGATIVE
TZAT-0005SLOWVT: 88 pct
TZAT-0005SLOWVT: 91 pct
TZAT-0011FASTVT: 10 ms
TZAT-0011SLOWVT: 10 ms
TZAT-0012ATACH: 150 ms
TZAT-0012ATACH: 150 ms
TZAT-0012FASTVT: 200 ms
TZAT-0012SLOWVT: 200 ms
TZAT-0012SLOWVT: 200 ms
TZAT-0013FASTVT: 3
TZAT-0013SLOWVT: 2
TZAT-0013SLOWVT: 3
TZAT-0018ATACH: NEGATIVE
TZAT-0018ATACH: NEGATIVE
TZAT-0018ATACH: NEGATIVE
TZAT-0018FASTVT: NEGATIVE
TZAT-0018SLOWVT: NEGATIVE
TZAT-0018SLOWVT: NEGATIVE
TZAT-0019ATACH: 6 V
TZAT-0019ATACH: 6 V
TZAT-0019FASTVT: 8 V
TZON-0003ATACH: 350 ms
TZON-0003FASTVT: 300 ms
TZON-0004SLOWVT: 36
TZON-0005SLOWVT: 12
TZST-0001ATACH: 6
TZST-0001FASTVT: 3
TZST-0001FASTVT: 6
TZST-0001SLOWVT: 4
TZST-0001SLOWVT: 6
TZST-0002ATACH: NEGATIVE
TZST-0003FASTVT: 35 J
TZST-0003SLOWVT: 35 J
VF: 0

## 2011-06-05 ENCOUNTER — Other Ambulatory Visit: Payer: Medicare Other

## 2011-06-05 ENCOUNTER — Ambulatory Visit: Payer: Medicare Other | Admitting: Surgery

## 2011-06-06 NOTE — Progress Notes (Signed)
Remote icd check w/icm  

## 2011-06-09 ENCOUNTER — Encounter: Payer: Self-pay | Admitting: *Deleted

## 2011-06-23 ENCOUNTER — Ambulatory Visit (INDEPENDENT_AMBULATORY_CARE_PROVIDER_SITE_OTHER)
Admission: RE | Admit: 2011-06-23 | Discharge: 2011-06-23 | Disposition: A | Discharge status: Home / Self Care | Payer: Medicare Other | Source: Ambulatory Visit | Attending: Adult Health | Admitting: Adult Health

## 2011-06-23 ENCOUNTER — Encounter: Payer: Self-pay | Admitting: Adult Health

## 2011-06-23 ENCOUNTER — Ambulatory Visit (INDEPENDENT_AMBULATORY_CARE_PROVIDER_SITE_OTHER): Payer: Medicare Other | Admitting: Adult Health

## 2011-06-23 VITALS — BP 128/72 | HR 91 | Temp 96.9°F | Ht 66.0 in | Wt 184.6 lb

## 2011-06-23 DIAGNOSIS — J441 Chronic obstructive pulmonary disease with (acute) exacerbation: Secondary | ICD-10-CM

## 2011-06-23 MED ORDER — AMOXICILLIN-POT CLAVULANATE 875-125 MG PO TABS: 1.0000 | ORAL_TABLET | Freq: Two times a day (BID) | ORAL | Status: AC

## 2011-06-23 MED ORDER — PREDNISONE 10 MG PO TABS: ORAL_TABLET | ORAL | Status: DC

## 2011-06-23 NOTE — Progress Notes (Signed)
  Subjective:    Patient ID: [name], male    DOB: [birth year], 75 y.o.   MRN: [id number]  HPI 75 yo with known hx of COPD   06/23/2011 Acute OV  Pt complains of prod cough with small amounts yellow mucus, wheezing, increased SOB, tightness in chest x 2 days. No recent travel or abx use. OTC not helping. Last cxr 6/12 showed improved RLL opacity. Currently on prednisone 10mg  daily . Took 40mg  daily this am of prednisone.  No hemoptysis or increased edema.    Review of Systems Constitutional:   No  weight loss, night sweats,  Fevers, chills,  +fatigue, or  lassitude.  HEENT:   No headaches,  Difficulty swallowing,  Tooth/dental problems, or  Sore throat,                No sneezing, itching, ear ache, +nasal congestion, post nasal drip,   CV:  No chest pain,  Orthopnea, PND, swelling in lower extremities, anasarca, dizziness, palpitations, syncope.   GI  No heartburn, indigestion, abdominal pain, nausea, vomiting, diarrhea, change in bowel habits, loss of appetite, bloody stools.   Resp:    No coughing up of blood.    No chest wall deformity  Skin: no rash or lesions.  GU: no dysuria, change in color of urine, no urgency or frequency.  No flank pain, no hematuria   MS:  No joint pain or swelling.  No decreased range of motion.     Psych:  No change in mood or affect. No depression or anxiety.          Objective:   Physical Exam GEN: A/Ox3; pleasant , NAD, elderly chronically ill appearing.   HEENT:  Media/AT,  EACs-clear, TMs-wnl, NOSE-clear, THROAT-clear, no lesions, no postnasal drip or exudate noted.   NECK:  Supple w/ fair ROM; no JVD; normal carotid impulses w/o bruits; no thyromegaly or nodules palpated; no lymphadenopathy.  RESP  Coarse BS w/ no trace exp wheezing.   CARD:  RRR, no m/r/g  , tr peripheral edema, pulses intact, no cyanosis or clubbing.  GI:   Soft & nt; nml bowel sounds; no organomegaly or masses detected.  Musco: Warm bil, no deformities or  joint swelling noted.   Neuro: alert, no focal deficits noted.    Skin: Warm, no lesions or rashes          Assessment & Plan:

## 2011-06-23 NOTE — Patient Instructions (Signed)
Augmentin 875mg  Twice daily  For 7 days take w/ food Mucinex DM Twice daily  As needed  Cough/congestion  Fluids and rest  Increase Prednisone 10mg  4 tabs for 5 days, then 3 tabs for 5 days, 2 tabs for 5 days, then back to 1  tab daily  follow up Dr. Shelle Iron as planned and As needed   Please contact office for sooner follow up if symptoms do not improve or worsen or seek emergency care

## 2011-06-23 NOTE — Assessment & Plan Note (Signed)
Flare :  Augmentin 875mg  Twice daily  For 7 days take w/ food Mucinex DM Twice daily  As needed  Cough/congestion  Fluids and rest  Increase Prednisone 10mg  4 tabs for 5 days, then 3 tabs for 5 days, 2 tabs for 5 days, then back to 1  tab daily  follow up Dr. Shelle Iron as planned and As needed   Please contact office for sooner follow up if symptoms do not improve or worsen or seek emergency care

## 2011-07-04 DIAGNOSIS — S065X9A Traumatic subdural hemorrhage with loss of consciousness of unspecified duration, initial encounter: Secondary | ICD-10-CM

## 2011-07-04 DIAGNOSIS — S065XAA Traumatic subdural hemorrhage with loss of consciousness status unknown, initial encounter: Secondary | ICD-10-CM

## 2011-07-04 HISTORY — DX: Traumatic subdural hemorrhage with loss of consciousness status unknown, initial encounter: S06.5XAA

## 2011-07-04 HISTORY — DX: Traumatic subdural hemorrhage with loss of consciousness of unspecified duration, initial encounter: S06.5X9A

## 2011-07-12 ENCOUNTER — Encounter: Payer: Self-pay | Admitting: Internal Medicine

## 2011-07-12 ENCOUNTER — Inpatient Hospital Stay (HOSPITAL_COMMUNITY): Payer: Medicare Other

## 2011-07-12 ENCOUNTER — Other Ambulatory Visit: Payer: Self-pay

## 2011-07-12 ENCOUNTER — Inpatient Hospital Stay (HOSPITAL_COMMUNITY)
Admission: AD | Admit: 2011-07-12 | Discharge: 2011-07-17 | DRG: 191 | Disposition: A | Discharge status: Home / Self Care | Payer: Medicare Other | Source: Ambulatory Visit | Attending: Pulmonary Disease | Admitting: Pulmonary Disease

## 2011-07-12 ENCOUNTER — Ambulatory Visit (INDEPENDENT_AMBULATORY_CARE_PROVIDER_SITE_OTHER): Payer: Medicare Other | Admitting: Internal Medicine

## 2011-07-12 ENCOUNTER — Telehealth: Payer: Self-pay | Admitting: Pulmonary Disease

## 2011-07-12 ENCOUNTER — Encounter (HOSPITAL_COMMUNITY): Payer: Self-pay

## 2011-07-12 ENCOUNTER — Ambulatory Visit (INDEPENDENT_AMBULATORY_CARE_PROVIDER_SITE_OTHER)
Admission: RE | Admit: 2011-07-12 | Discharge: 2011-07-12 | Disposition: A | Discharge status: Home / Self Care | Payer: Medicare Other | Source: Ambulatory Visit | Attending: Internal Medicine | Admitting: Internal Medicine

## 2011-07-12 VITALS — BP 142/70 | HR 69 | Temp 98.0°F | Ht 66.0 in | Wt 182.2 lb

## 2011-07-12 DIAGNOSIS — I255 Ischemic cardiomyopathy: Secondary | ICD-10-CM | POA: Diagnosis present

## 2011-07-12 DIAGNOSIS — Z95810 Presence of automatic (implantable) cardiac defibrillator: Secondary | ICD-10-CM

## 2011-07-12 DIAGNOSIS — I2510 Atherosclerotic heart disease of native coronary artery without angina pectoris: Secondary | ICD-10-CM | POA: Diagnosis present

## 2011-07-12 DIAGNOSIS — Z7901 Long term (current) use of anticoagulants: Secondary | ICD-10-CM

## 2011-07-12 DIAGNOSIS — I4891 Unspecified atrial fibrillation: Secondary | ICD-10-CM | POA: Diagnosis present

## 2011-07-12 DIAGNOSIS — J441 Chronic obstructive pulmonary disease with (acute) exacerbation: Principal | ICD-10-CM

## 2011-07-12 DIAGNOSIS — E119 Type 2 diabetes mellitus without complications: Secondary | ICD-10-CM | POA: Diagnosis present

## 2011-07-12 DIAGNOSIS — I472 Ventricular tachycardia, unspecified: Secondary | ICD-10-CM | POA: Diagnosis present

## 2011-07-12 DIAGNOSIS — I509 Heart failure, unspecified: Secondary | ICD-10-CM

## 2011-07-12 DIAGNOSIS — I10 Essential (primary) hypertension: Secondary | ICD-10-CM | POA: Diagnosis present

## 2011-07-12 DIAGNOSIS — E785 Hyperlipidemia, unspecified: Secondary | ICD-10-CM | POA: Diagnosis present

## 2011-07-12 DIAGNOSIS — Z9981 Dependence on supplemental oxygen: Secondary | ICD-10-CM

## 2011-07-12 DIAGNOSIS — I4729 Other ventricular tachycardia: Secondary | ICD-10-CM | POA: Diagnosis present

## 2011-07-12 DIAGNOSIS — I25810 Atherosclerosis of coronary artery bypass graft(s) without angina pectoris: Secondary | ICD-10-CM

## 2011-07-12 DIAGNOSIS — I2589 Other forms of chronic ischemic heart disease: Secondary | ICD-10-CM | POA: Diagnosis present

## 2011-07-12 LAB — DIFFERENTIAL
Basophils Absolute: 0 10*3/uL (ref 0.0–0.1)
Eosinophils Relative: 0 % (ref 0–5)
Lymphocytes Relative: 10 % — ABNORMAL LOW (ref 12–46)
Lymphs Abs: 0.8 10*3/uL (ref 0.7–4.0)
Monocytes Absolute: 0.4 10*3/uL (ref 0.1–1.0)
Neutro Abs: 6.6 10*3/uL (ref 1.7–7.7)

## 2011-07-12 LAB — BASIC METABOLIC PANEL
CO2: 30 mEq/L (ref 19–32)
Calcium: 9 mg/dL (ref 8.4–10.5)
Chloride: 94 mEq/L — ABNORMAL LOW (ref 96–112)
Creatinine, Ser: 0.74 mg/dL (ref 0.50–1.35)
Glucose, Bld: 136 mg/dL — ABNORMAL HIGH (ref 70–99)
Sodium: 133 mEq/L — ABNORMAL LOW (ref 135–145)

## 2011-07-12 LAB — CBC
HCT: 34.3 % — ABNORMAL LOW (ref 39.0–52.0)
MCV: 87.1 fL (ref 78.0–100.0)
RBC: 3.94 MIL/uL — ABNORMAL LOW (ref 4.22–5.81)
WBC: 7.8 10*3/uL (ref 4.0–10.5)

## 2011-07-12 LAB — PROTIME-INR
INR: 2.84 — ABNORMAL HIGH (ref 0.00–1.49)
Prothrombin Time: 30.3 seconds — ABNORMAL HIGH (ref 11.6–15.2)

## 2011-07-12 LAB — CARDIAC PANEL(CRET KIN+CKTOT+MB+TROPI)
Relative Index: INVALID (ref 0.0–2.5)
Total CK: 75 U/L (ref 7–232)

## 2011-07-12 MED ORDER — DIGOXIN 125 MCG PO TABS
125.0000 ug | ORAL_TABLET | Freq: Every day | ORAL | Status: DC
  Administered 2011-07-13 – 2011-07-17 (×5): 125 ug via ORAL
  Filled 2011-07-12 (×7): qty 1

## 2011-07-12 MED ORDER — METHYLPREDNISOLONE SODIUM SUCC 125 MG IJ SOLR
80.0000 mg | Freq: Three times a day (TID) | INTRAMUSCULAR | Status: DC
  Administered 2011-07-12 – 2011-07-14 (×5): 80 mg via INTRAVENOUS
  Filled 2011-07-12 (×3): qty 1.28
  Filled 2011-07-12: qty 2
  Filled 2011-07-12 (×4): qty 1.28
  Filled 2011-07-12: qty 2

## 2011-07-12 MED ORDER — FERROUS SULFATE 325 (65 FE) MG PO TABS
325.0000 mg | ORAL_TABLET | Freq: Every day | ORAL | Status: DC
  Administered 2011-07-13 – 2011-07-17 (×5): 325 mg via ORAL
  Filled 2011-07-12 (×7): qty 1

## 2011-07-12 MED ORDER — ALBUTEROL SULFATE (5 MG/ML) 0.5% IN NEBU
2.5000 mg | INHALATION_SOLUTION | RESPIRATORY_TRACT | Status: DC | PRN
  Administered 2011-07-16: 2.5 mg via RESPIRATORY_TRACT
  Filled 2011-07-12 (×2): qty 0.5

## 2011-07-12 MED ORDER — LEVALBUTEROL HCL 0.63 MG/3ML IN NEBU
0.6300 mg | INHALATION_SOLUTION | Freq: Once | RESPIRATORY_TRACT | Status: AC
  Administered 2011-07-12: 0.63 mg via RESPIRATORY_TRACT

## 2011-07-12 MED ORDER — FUROSEMIDE 40 MG PO TABS
40.0000 mg | ORAL_TABLET | Freq: Two times a day (BID) | ORAL | Status: DC
  Administered 2011-07-12 – 2011-07-17 (×10): 40 mg via ORAL
  Filled 2011-07-12 (×12): qty 1

## 2011-07-12 MED ORDER — CARVEDILOL 12.5 MG PO TABS
12.5000 mg | ORAL_TABLET | Freq: Two times a day (BID) | ORAL | Status: DC
  Administered 2011-07-13 – 2011-07-17 (×9): 12.5 mg via ORAL
  Filled 2011-07-12 (×11): qty 1

## 2011-07-12 MED ORDER — POTASSIUM CHLORIDE CRYS ER 20 MEQ PO TBCR
20.0000 meq | EXTENDED_RELEASE_TABLET | Freq: Every day | ORAL | Status: DC
  Administered 2011-07-12 – 2011-07-16 (×5): 20 meq via ORAL
  Filled 2011-07-12 (×6): qty 1

## 2011-07-12 MED ORDER — SIMVASTATIN 20 MG PO TABS
20.0000 mg | ORAL_TABLET | Freq: Every day | ORAL | Status: DC
  Administered 2011-07-12 – 2011-07-16 (×6): 20 mg via ORAL
  Filled 2011-07-12 (×8): qty 1

## 2011-07-12 MED ORDER — NITROGLYCERIN 0.4 MG SL SUBL: 0.4000 mg | SUBLINGUAL_TABLET | SUBLINGUAL | Status: DC | PRN

## 2011-07-12 MED ORDER — IPRATROPIUM BROMIDE 0.02 % IN SOLN: 0.5000 mg | RESPIRATORY_TRACT | Status: DC | PRN

## 2011-07-12 MED ORDER — METHYLPREDNISOLONE ACETATE 80 MG/ML IJ SUSP
80.0000 mg | Freq: Once | INTRAMUSCULAR | Status: AC
  Administered 2011-07-12: 80 mg via INTRAMUSCULAR

## 2011-07-12 MED ORDER — GUAIFENESIN ER 600 MG PO TB12
600.0000 mg | ORAL_TABLET | Freq: Two times a day (BID) | ORAL | Status: DC | PRN
  Administered 2011-07-12 – 2011-07-16 (×4): 600 mg via ORAL
  Filled 2011-07-12 (×4): qty 1

## 2011-07-12 MED ORDER — OLMESARTAN MEDOXOMIL 20 MG PO TABS
20.0000 mg | ORAL_TABLET | Freq: Every day | ORAL | Status: DC
  Administered 2011-07-12 – 2011-07-16 (×5): 20 mg via ORAL
  Filled 2011-07-12 (×6): qty 1

## 2011-07-12 MED ORDER — MOXIFLOXACIN HCL IN NACL 400 MG/250ML IV SOLN
400.0000 mg | INTRAVENOUS | Status: DC
  Administered 2011-07-12 – 2011-07-13 (×2): 400 mg via INTRAVENOUS
  Filled 2011-07-12 (×4): qty 250

## 2011-07-12 MED ORDER — ALBUTEROL SULFATE (5 MG/ML) 0.5% IN NEBU
2.5000 mg | INHALATION_SOLUTION | RESPIRATORY_TRACT | Status: DC
  Administered 2011-07-12 – 2011-07-13 (×4): 2.5 mg via RESPIRATORY_TRACT
  Filled 2011-07-12 (×4): qty 0.5

## 2011-07-12 MED ORDER — IPRATROPIUM BROMIDE 0.02 % IN SOLN
0.5000 mg | RESPIRATORY_TRACT | Status: DC
  Administered 2011-07-12 – 2011-07-13 (×4): 0.5 mg via RESPIRATORY_TRACT
  Filled 2011-07-12 (×4): qty 2.5

## 2011-07-12 MED ORDER — AMLODIPINE BESYLATE 5 MG PO TABS
5.0000 mg | ORAL_TABLET | Freq: Every day | ORAL | Status: DC
  Administered 2011-07-12 – 2011-07-16 (×5): 5 mg via ORAL
  Filled 2011-07-12 (×6): qty 1

## 2011-07-12 MED ORDER — NITROGLYCERIN 0.2 MG/HR TD PT24
0.2000 mg | MEDICATED_PATCH | Freq: Every day | TRANSDERMAL | Status: DC
  Administered 2011-07-13 – 2011-07-16 (×4): 0.2 mg via TRANSDERMAL
  Filled 2011-07-12 (×6): qty 1

## 2011-07-12 NOTE — H&P (Signed)
Name: [name] MRN: [id number] DOB: 1936-05-29    LOS: 0  PCCM ADMISSION NOTE  History of Present Illness: 76 yo WM with oxygen-dependent COPD and CHF who presented to Pulmonary office today complaining of increased dyspnea and cough.  There was no fever, hemoptysis or chest pain.  The onset of symptoms was about one week ago when he was seen in the Pulmonary office and prescribed oral steroids.  He felt better for couple of days but that did not last.  He was sent to [facility] for inpatient treatment of acute COPD exacerbation.  Lines / Drains: None  Cultures: None  Antibiotics: 1/8  Avelox  Tests / Events: 1/8  CXR>>>  Past Medical History  Diagnosis Date  . Hypokalemia   . Colon cancer   . PVD (peripheral vascular disease)   . HTN (hypertension)   . Automatic implantable cardiac defibrillator in situ   . CHF (congestive heart failure)   . Ventricular tachycardia   . Ischemic heart disease, chronic   . Dyslipidemia   . CAD (coronary artery disease)   . COPD (chronic obstructive pulmonary disease)   . Emphysema   . On home oxygen therapy   . Difficult intubation    Past Surgical History  Procedure Date  . Lumbar back surgery     x2  . Cholecystectomy   . Iliac artery stent     left  . Implantation of medtronic concerto bi-v icd 04-10-08    [name]  . Colonoscopy   . Polypectomy    Prior to Admission medications   Medication Sig Start Date End Date Taking? Authorizing Provider  albuterol (PROVENTIL HFA;VENTOLIN HFA) 108 (90 BASE) MCG/ACT inhaler Inhale 1-2 puffs into the lungs every 6 (six) hours as needed. Shortness of breath 12/23/10  Yes [name]  albuterol (PROVENTIL) (2.5 MG/3ML) 0.083% nebulizer solution Take 2.5 mg by nebulization 4 (four) times daily.    Yes Historical Provider, Bradley  amLODipine (NORVASC) 5 MG tablet Take 5 mg by mouth daily.     Yes Historical Provider, Bradley  budesonide (PULMICORT) 0.5 MG/2ML nebulizer solution Take 0.5 mg  by nebulization 2 (two) times daily.    Yes Historical Provider, Bradley  candesartan (ATACAND) 32 MG tablet Take 32 mg by mouth daily.     Yes Historical Provider, Bradley  carvedilol (COREG) 12.5 MG tablet Take 12.5 mg by mouth 2 (two) times daily with a meal.     Yes Historical Provider, Bradley  digoxin (LANOXIN) 0.125 MG tablet Take 125 mcg by mouth daily.     Yes Historical Provider, Bradley  ferrous sulfate 325 (65 FE) MG tablet Take 325 mg by mouth daily with breakfast.   Yes Historical Provider, Bradley  furosemide (LASIX) 40 MG tablet Take 2 tabs by mouth in the morning and 1 tab in the evening 12/23/10  Yes [name]  guaiFENesin (MUCINEX) 600 MG 12 hr tablet Take 600 mg by mouth 2 (two) times daily as needed. congestion   Yes Historical Provider, Bradley  HYDROcodone-acetaminophen (VICODIN) 2.5-500 MG per tablet Take 1 tablet by mouth every 6 (six) hours as needed. Back pain   Yes Historical Provider, Bradley  nitroGLYCERIN (NITRODUR - DOSED IN MG/24 HR) 0.2 mg/hr Place 1 patch onto the skin daily.    Yes Historical Provider, Bradley  nitroGLYCERIN (NITROSTAT) 0.4 MG SL tablet Place 0.4 mg under the tongue every 5 (five) minutes as needed. May repeat x3    Yes Historical  Provider, Bradley  potassium chloride SA (K-DUR,KLOR-CON) 20 MEQ tablet Take 20 mEq by mouth daily.     Yes Historical Provider, Bradley  predniSONE (DELTASONE) 10 MG tablet TAKE ONE TABLET BY MOUTH ONE TIME DAILY 04/06/11 04/05/12 Yes [name]  simvastatin (ZOCOR) 20 MG tablet Take 20 mg by mouth at bedtime.     Yes Historical Provider, Bradley  tiotropium (SPIRIVA) 18 MCG inhalation capsule Place 18 mcg into inhaler and inhale daily.     Yes Historical Provider, Bradley  warfarin (COUMADIN) 2 MG tablet Take 2.5 mg by mouth. Name Brand Coumadin Only!!! Takes 1.5 tablet (3.75mg ) every day except Fridays and takes only 1 tablet (2.5mg ).   Yes Historical Provider, Bradley   Allergies No Known Allergies  Family History Family History  Problem Relation Age of Onset    . Heart failure Father    Social History  reports that he quit smoking about 9 years ago. His smoking use included Cigarettes. He has a 144 pack-year smoking history. He quit smokeless tobacco use about 9 years ago. He reports that he does not drink alcohol or use illicit drugs.  Review Of Systems  11 points review of systems is negative with an exception of listed in HPI.  Vital Signs: Temp:  [98 F (36.7 C)-98.2 F (36.8 C)] 98.2 F (36.8 C) (01/09 1804) Pulse Rate:  [69-72] 72  (01/09 1804) Resp:  [22] 22  (01/09 1804) BP: (142-180)/(70-76) 180/76 mmHg (01/09 1804) SpO2:  [94 %] 94 % (01/09 1804) Weight:  [82.645 kg (182 lb 3.2 oz)] 82.645 kg (182 lb 3.2 oz) (01/09 1556)   Physical Examination: General:  Not in acute distress Neuro:  Awake, alert, cooperative   HEENT:  PERRL, moist membranes Neck:  No JVD   Cardiovascular: RRR, no murmurs Lungs:  Prolonged expiratory phase, bilateral very diminished breath sounds, few expiratory wheezes, scattered rhonchi, no rales Abdomen:  Soft, nontender, nondistended Musculoskeletal:  No pedal edema Skin:  No rash  Labs and Imaging:  Reviewed.  Please refer to the Assessment and Plan section for relevant results.  Assessment:  Acute exacerbation of COPD Coronary artery disease with ischemic cardiomyopathy Hypertension Dyslipidemia Peripheral vascular disease  Plan:  CBC, BMP, Cardiac enzymes CXR, 12 lead ECG Duonebs Solu-Medrol Avelox Preadmission medications Coumadin per Pharmacy Cardiac prudent diet GI Px is not indicated DVT Px is not indicated (on Coumadin)   [name], M.D. Pulmonary and Critical Care Medicine [facility] Cell: [phone] Pager: [phone]  07/12/2011, 7:25 PM

## 2011-07-12 NOTE — Assessment & Plan Note (Signed)
Admit cone tele bed (he prefers cone over  due to cardiac support) Neb xopenex in office IM Depot MEdrol 80mg  in office Check abg, labs and start antibiotic arrival Full Code per Dr Shelle Iron

## 2011-07-12 NOTE — Telephone Encounter (Signed)
Called spoke with patient, appt scheduled with MR today @ 3:45pm, pt to arrive prior for STAT cxr.  Pt reported that he had "a bad couging spell" since we last spoke and it took him 15 mins to get his breathing under control.  Advised pt that if this happens again and he feels he needs immediate assistance to call 911 IMMEDIATELY.  Pt verbalized his understanding.  appt and STAT cxr ordered.  Will sign and forward to [facility] as FYI.

## 2011-07-12 NOTE — Progress Notes (Signed)
CRITICAL VALUE ALERT  Critical value received:  CK-MB 6.5   Date of notification:  07/12/2011   Time of notification:  2130  Critical value read back:yes  Nurse who received alert:  Hively, Avie Echevaria, RN  MD notified (1st page):  Dr. Vassie Loll  Time of first page:  10:11 PM   MD notified (2nd page):  Time of second page:  Responding MD:  Dr. Vassie Loll  Time MD responded:  10:11 PM

## 2011-07-12 NOTE — Progress Notes (Signed)
Addended by: Darrell Jewel on: 07/12/2011 05:02 PM   Modules accepted: Orders

## 2011-07-12 NOTE — Patient Instructions (Signed)
Admit cone tele bed (he prefers cone over [place] due to cardiac support) Neb xopenex in office IM Depot MEdrol 80mg in office Check abg, labs and start antibiotic arrival Full Code per Dr Clance  

## 2011-07-12 NOTE — Progress Notes (Signed)
ANTICOAGULATION CONSULT NOTE - Initial Consult  Pharmacy Consult for Coumadin Indication: atrial fibrillation  No Known Allergies  Patient Measurements:   Total  Body Weight: 84kg  Vital Signs: Temp: 98.2 F (36.8 C) (01/09 1804) Temp src: Oral (01/09 1804) BP: 180/76 mmHg (01/09 1804) Pulse Rate: 79  (01/09 2052)  Labs:  Basename 07/12/11 2008 07/12/11 2007  HGB -- 11.4*  HCT -- 34.3*  PLT -- 239  APTT -- --  LABPROT -- 30.3*  INR -- 2.84*  HEPARINUNFRC -- --  CREATININE -- 0.74  CKTOTAL 75 --  CKMB 6.5* --  TROPONINI <0.30 --    Medical History: Past Medical History  Diagnosis Date  . Hypokalemia   . Colon cancer   . PVD (peripheral vascular disease)   . HTN (hypertension)   . Automatic implantable cardiac defibrillator in situ   . CHF (congestive heart failure)   . Ventricular tachycardia   . Ischemic heart disease, chronic   . Dyslipidemia   . CAD (coronary artery disease)   . COPD (chronic obstructive pulmonary disease)   . Emphysema   . On home oxygen therapy   . Difficult intubation   A-Fib per outpatient notes    Assessment: Pt admitted for  Increased dyspnea and cough.  COPD exaserbation being treated with steroids and Moxifloxacin Day #0.  This may increase INR.  H/H stable. No bleeding noted.  Pt took his Coumadin prior to admission today.  INR 2.8 at goal  Goal of Therapy:  INR 2-3   Plan:  No Coumdain tonight has already taken todays dose PTA Daily protime  Marcelino Scot 07/12/2011,9:34 PM

## 2011-07-12 NOTE — Progress Notes (Signed)
Subjective:    Patient ID: [name], male    DOB: [birth year], 76 y.o.   MRN: [id number]  HPI 76 yo with known hx of COPD   06/23/2011 Acute OV  Pt complains of prod cough with small amounts yellow mucus, wheezing, increased SOB, tightness in chest x 2 days. No recent travel or abx use. OTC not helping. Last cxr 6/12 showed improved RLL opacity. Currently on prednisone 10mg  daily . Took 40mg  daily this am of prednisone.  No hemoptysis or increased edema.    OV 07/12/2011 Known COPD fev1 1L/36%, cardiomyopathy. Followed by Dr Shelle Iron. On baseline 2L  and prednisone 10mg  daily. Also AICD with low ef. Saw NP acutely on 06/23/11 for AECOPD and treated with antibiotic and prednisone burst. Felt better earlier this week. But last 1 day worsening dyspnea. Sputum color has improved from yellow though. No weight gain. Feels very labored. Feels he needs admission though does not necessarily want that. Denies fever, edema. Admits to significant orthopnea and wheeze. Home nebss not helping      OV 07/12/2011 Dg Chest 2 View  07/12/2011  *RADIOLOGY REPORT*  Clinical Data: Shortness of breath and cough  CHEST - 2 VIEW  Comparison: 06/23/2011  Findings: The lungs are clear without focal infiltrate, edema, pneumothorax or pleural effusion. Interstitial markings are diffusely coarsened with chronic features. Cardiopericardial silhouette is at upper limits of normal for size.Left-sided pacer / AICD remains in place nipple shadows project over each lower lung. Bones are diffusely demineralized.  IMPRESSION: Stable.  No new or acute findings.  Original Report Authenticated By: ERIC A. MANSELL, M.D.    Past Medical History  Diagnosis Date  . Hypokalemia   . Colon cancer   . PVD (peripheral vascular disease)   . HTN (hypertension)   . Automatic implantable cardiac defibrillator in situ   . CHF (congestive heart failure)   . Ventricular tachycardia   . Ischemic heart disease, chronic   . Dyslipidemia     . CAD (coronary artery disease)   . COPD (chronic obstructive pulmonary disease)   . Emphysema   . On home oxygen therapy      Family History  Problem Relation Age of Onset  . Heart failure Father      History   Social History  . Marital Status: Married    Spouse Name: N/A    Number of Children: N/A  . Years of Education: N/A   Occupational History  . Not on file.   Social History Main Topics  . Smoking status: Former Smoker -- 3.0 packs/day for 48 years    Types: Cigarettes    Quit date: 07/03/2002  . Smokeless tobacco: Not on file  . Alcohol Use: No  . Drug Use: No  . Sexually Active: Not on file   Other Topics Concern  . Not on file   Social History Narrative  . No narrative on file     No Known Allergies   Outpatient Prescriptions Prior to Visit  Medication Sig Dispense Refill  . albuterol (PROVENTIL HFA) 108 (90 BASE) MCG/ACT inhaler Inhale 1-2 puffs into the lungs every 6 (six) hours as needed.  3 Inhaler  3  . albuterol (PROVENTIL) (2.5 MG/3ML) 0.083% nebulizer solution Take 2.5 mg by nebulization 4 (four) times daily.       Marland Kitchen amLODipine (NORVASC) 5 MG tablet Take 5 mg by mouth daily.        . budesonide (PULMICORT) 0.5 MG/2ML nebulizer solution Take 0.5 mg  by nebulization 2 (two) times daily.        . candesartan (ATACAND) 32 MG tablet Take 32 mg by mouth daily.        . carvedilol (COREG) 12.5 MG tablet Take 12.5 mg by mouth 2 (two) times daily with a meal.        . digoxin (LANOXIN) 0.125 MG tablet Take 125 mcg by mouth daily.        . furosemide (LASIX) 40 MG tablet Take 2 tabs by mouth in the morning and 1 tab in the evening  270 tablet  3  . guaiFENesin (MUCINEX) 600 MG 12 hr tablet Take 1,200 mg by mouth 2 (two) times daily.        Marland Kitchen HYDROcodone-acetaminophen (VICODIN) 2.5-500 MG per tablet Take 1 tablet by mouth every 6 (six) hours as needed.        . nitroGLYCERIN (NITRODUR - DOSED IN MG/24 HR) 0.2 mg/hr Place 1 patch onto the skin daily.         . nitroGLYCERIN (NITROSTAT) 0.4 MG SL tablet Place 0.4 mg under the tongue every 5 (five) minutes as needed. May repeat x3       . potassium chloride SA (K-DUR,KLOR-CON) 20 MEQ tablet Take 20 mEq by mouth daily.        . predniSONE (DELTASONE) 10 MG tablet TAKE ONE TABLET BY MOUTH ONE TIME DAILY  30 tablet  4  . simvastatin (ZOCOR) 20 MG tablet Take 20 mg by mouth at bedtime.        Marland Kitchen tiotropium (SPIRIVA) 18 MCG inhalation capsule Place 18 mcg into inhaler and inhale daily.        Marland Kitchen warfarin (COUMADIN) 2 MG tablet Take 2.5 mg by mouth daily. Name Bran Coumadin Only!!!      . predniSONE (DELTASONE) 10 MG tablet 4 tabs for 5 days, then 3 tabs for 5 days, 2 tabs for 5 days, then back to 1 daily  60 tablet  0      Review of Systems  Constitutional: Negative for fever and unexpected weight change.  HENT: Positive for nosebleeds, congestion, rhinorrhea and postnasal drip. Negative for ear pain, sore throat, sneezing, trouble swallowing, dental problem and sinus pressure.   Eyes: Negative for redness and itching.  Respiratory: Positive for cough, chest tightness, shortness of breath and wheezing.   Cardiovascular: Negative for palpitations and leg swelling.  Gastrointestinal: Negative for nausea and vomiting.  Genitourinary: Negative for dysuria and difficulty urinating.  Musculoskeletal: Negative for joint swelling.  Skin: Negative for rash.  Neurological: Negative for headaches.  Hematological: Bruises/bleeds easily.  Psychiatric/Behavioral: Negative for dysphoric mood. The patient is not nervous/anxious.        Objective:   Physical Exam  Nursing note and vitals reviewed. Constitutional: He is oriented to person, place, and time. He appears well-developed and well-nourished. No distress.       Body mass index is 29.41 kg/(m^2).  HENT:  Head: Normocephalic and atraumatic.  Right Ear: External ear normal.  Left Ear: External ear normal.  Mouth/Throat: Oropharynx is clear and moist. No  oropharyngeal exudate.  Eyes: Conjunctivae and EOM are normal. Pupils are equal, round, and reactive to light. Right eye exhibits no discharge. Left eye exhibits no discharge. No scleral icterus.  Neck: Normal range of motion. Neck supple. No JVD present. No tracheal deviation present. No thyromegaly present.  Cardiovascular: Normal rate, regular rhythm and intact distal pulses.  Exam reveals no gallop and no friction rub.   No  murmur heard. Pulmonary/Chest: Effort normal. No respiratory distress. He has wheezes. He has no rales. He exhibits no tenderness.       Sitting in stretcher  Leaning forward Looks dyspneic Mild Acc muscle use + Some difficulty with sentences  Chest is somehwat tight  Abdominal: Soft. Bowel sounds are normal. He exhibits no distension and no mass. There is no tenderness. There is no rebound and no guarding.  Musculoskeletal: Normal range of motion. He exhibits no edema and no tenderness.  Lymphadenopathy:    He has no cervical adenopathy.  Neurological: He is alert and oriented to person, place, and time. He has normal reflexes. No cranial nerve deficit. Coordination normal.  Skin: Skin is warm and dry. No rash noted. He is not diaphoretic. No erythema. No pallor.  Psychiatric: He has a normal mood and affect. His behavior is normal. Judgment and thought content normal.          Assessment & Plan:

## 2011-07-12 NOTE — Telephone Encounter (Signed)
Called spoke with patient finished the augmentin x7d and pred taper given by TP on 12.21.12 ov.  Pt reports symptoms improved but then began to worsen x1 day with increased SOB, prod cough with clear mucus and wheezing.  TP not in office today and KC with no openings.  Pt stated he prefers to not have to wait until tomorrow to be seen.  [name].  NKDA.  Next appt with KC 2.1.13.  Dr Shelle Iron please advise, thanks.

## 2011-07-12 NOTE — Telephone Encounter (Signed)
i think he needs to be seen today, and needs cxr prior to visit!! Talk with TD to see where we can fit him in.

## 2011-07-13 DIAGNOSIS — J441 Chronic obstructive pulmonary disease with (acute) exacerbation: Secondary | ICD-10-CM

## 2011-07-13 DIAGNOSIS — I428 Other cardiomyopathies: Secondary | ICD-10-CM

## 2011-07-13 LAB — CBC
HCT: 34 % — ABNORMAL LOW (ref 39.0–52.0)
MCH: 28.4 pg (ref 26.0–34.0)
MCV: 87.9 fL (ref 78.0–100.0)
Platelets: 220 10*3/uL (ref 150–400)
RBC: 3.87 MIL/uL — ABNORMAL LOW (ref 4.22–5.81)
WBC: 6.7 10*3/uL (ref 4.0–10.5)

## 2011-07-13 LAB — BASIC METABOLIC PANEL
BUN: 21 mg/dL (ref 6–23)
CO2: 29 mEq/L (ref 19–32)
Calcium: 9 mg/dL (ref 8.4–10.5)
Chloride: 93 mEq/L — ABNORMAL LOW (ref 96–112)
Creatinine, Ser: 0.83 mg/dL (ref 0.50–1.35)

## 2011-07-13 LAB — PROTIME-INR
INR: 2.87 — ABNORMAL HIGH (ref 0.00–1.49)
Prothrombin Time: 30.5 seconds — ABNORMAL HIGH (ref 11.6–15.2)

## 2011-07-13 MED ORDER — ALBUTEROL SULFATE (5 MG/ML) 0.5% IN NEBU
2.5000 mg | INHALATION_SOLUTION | Freq: Four times a day (QID) | RESPIRATORY_TRACT | Status: DC
  Administered 2011-07-13 – 2011-07-17 (×16): 2.5 mg via RESPIRATORY_TRACT
  Filled 2011-07-13 (×15): qty 0.5

## 2011-07-13 MED ORDER — IPRATROPIUM BROMIDE 0.02 % IN SOLN
0.5000 mg | Freq: Four times a day (QID) | RESPIRATORY_TRACT | Status: DC
  Administered 2011-07-13 – 2011-07-17 (×16): 0.5 mg via RESPIRATORY_TRACT
  Filled 2011-07-13 (×16): qty 2.5

## 2011-07-13 MED ORDER — IPRATROPIUM BROMIDE 0.02 % IN SOLN
0.5000 mg | RESPIRATORY_TRACT | Status: DC
Start: 2011-07-13 — End: 2011-07-13

## 2011-07-13 MED ORDER — ALBUTEROL SULFATE (5 MG/ML) 0.5% IN NEBU: 2.5000 mg | INHALATION_SOLUTION | Freq: Four times a day (QID) | RESPIRATORY_TRACT | Status: DC

## 2011-07-13 MED ORDER — WARFARIN SODIUM 2.5 MG PO TABS
3.7500 mg | ORAL_TABLET | Freq: Once | ORAL | Status: AC
  Administered 2011-07-13: 3.75 mg via ORAL
  Filled 2011-07-13: qty 1

## 2011-07-13 NOTE — Progress Notes (Signed)
Addended by: Kalman Shan on: 07/13/2011 06:36 AM   Modules accepted: Level of Service

## 2011-07-13 NOTE — Progress Notes (Signed)
Subjective: Pt feels much better today.  No increased wob  Objective: Vital signs in last 24 hours: Blood pressure 142/69, pulse 68, temperature 97.3 F (36.3 C), temperature source Oral, resp. rate 18, weight 81.1 kg (178 lb 12.7 oz), SpO2 97.00%.  Intake/Output from previous day: 01/09 0701 - 01/10 0700 In: 730 [P.O.:480; IV Piggyback:250] Out: -    Physical Exam:   wd male in nad Chest with decreased bs, no wheezing Cor with irreg, cvr abd soft nt, nd, bs+ LE with mild edema Alert oriented and moves all 4.    Lab Results:  [facility] 07/13/11 0535 07/12/11 2007  WBC 6.7 7.8  HGB 11.0* 11.4*  HCT 34.0* 34.3*  PLT 220 239   BMET  Basename 07/13/11 0535 07/12/11 2007  NA 132* 133*  K 4.6 4.0  CL 93* 94*  CO2 29 30  GLUCOSE 231* 136*  BUN 21 18  CREATININE 0.83 0.74  CALCIUM 9.0 9.0    Studies/Results: Dg Chest 2 View  07/12/2011  *RADIOLOGY REPORT*  Clinical Data: Shortness of breath and cough  CHEST - 2 VIEW  Comparison: 06/23/2011  Findings: The lungs are clear without focal infiltrate, edema, pneumothorax or pleural effusion. Interstitial markings are diffusely coarsened with chronic features. Cardiopericardial silhouette is at upper limits of normal for size.Left-sided pacer / AICD remains in place nipple shadows project over each lower lung. Bones are diffusely demineralized.  IMPRESSION: Stable.  No new or acute findings.  Original Report Authenticated By: ERIC A. MANSELL, M.D.   Dg Chest Port 1 View  07/12/2011  *RADIOLOGY REPORT*  Clinical Data: Post procedure.  At the degrading.  PORTABLE CHEST - 1 VIEW  Comparison: 07/12/2011, earlier the same day  Findings: 2027 hours. The lungs are clear without focal infiltrate, edema, pneumothorax or pleural effusion. The cardiopericardial silhouette is enlarged.  Left-sided pacer / AICD remains in place.  IMPRESSION: Stable.  No new or acute interval findings.  Original Report Authenticated By: ERIC A. MANSELL, M.D.     Assessment/Plan: Patient Active Hospital Problem List:  Severe copd with acute exacerbation: The pt is much improved overnight with IV medications and BD.  No Pna by cxr.  Will continue current meds and monitor closely  Ischemic CM: No evidence for acute chf currently    Barbaraann Share, M.D. 07/13/2011, 8:18 AM

## 2011-07-13 NOTE — Progress Notes (Signed)
UR Completed.  [name] Jane [phone] 07/13/2011  

## 2011-07-13 NOTE — Progress Notes (Addendum)
ANTICOAGULATION CONSULT NOTE - Follow Up Consult  Pharmacy Consult for Coumadin Indication: atrial fibrillation  Admit Complaint: increased dyspnea and cough Pharmacist System-Based Medication Review: Anticoagulation: Warfarin for Afib resumed from PTA. INR 2.87 << 2.84. Home dose verified as 3.75 mg daily EXCEPT for 2.5 mg on Fridays only. Hgb/Hct/Plt stable. No s/sx of bleeding noted. Pt re-educated today. Infectious Disease: Avelox MD#2 for COPD exacerbation. Afebrile, WBC WNL. Dose appropriate (not renally adjusted) Cardiovascular: Hx HTN/PVD/CHF/DL/CAD/Afib. Pt has ICD in place. BP/24h: 140-180/50-80, HR: 60-80. On home norvasc, coreg, digoxin, lasix/kcl, simvastatin, NTG patch. Also continued on ARB--olmesartan (formulary sub for PTA candesartan).  Endocrinology: No hx DM however A1c 7.5 (12/03/10). CBG/24h: 136-231. (elevation may be related to steroid use for COPD). On no SSI Pulmonary: Hx COPD/emphysema (on home oxygen therapy) and admitted with COPD exacerbation. On albuterol/atrovent nebs q6h, solumedrol 80 mg IV q8h, avelox daily (PTA tiotropium, budesonide currently on hold) Best Practices: Warfarin for VTE px, home meds addressed  Assessment: 76 y.o. M on warfarin for Afib with a therapeutic INR this a.m. (INR 2.87, goal of 2-3). The patient has been resumed on his home dose since he was therapeutic on admission. PTA he was known to take 3.75 mg daily EXCEPT for 2.5 mg on Fridays only. Hgb/Hct/Plt stable. No s/sx of bleeding noted. Patient re-educated on warfarin today.  Goal of Therapy:  INR 2-3   Plan:  1. Warfarin 3.75 mg x 1 dose at 1800 today 2. Will continue to monitor for any signs/symptoms of bleeding and will follow up with PT/INR in the a.m.   Georgina Pillion, PharmD, BCPS 07/13/2011 9:08 AM     No Known Allergies  Patient Measurements: Weight: 178 lb 12.7 oz (81.1 kg)   Vital Signs: Temp: 97.3 F (36.3 C) (01/10 0505) Temp src: Oral (01/10 0505) BP: 142/69  mmHg (01/10 0505) Pulse Rate: 68  (01/10 0505)  Labs:  Basename 07/13/11 0535 07/12/11 2008 07/12/11 2007  HGB 11.0* -- 11.4*  HCT 34.0* -- 34.3*  PLT 220 -- 239  APTT -- -- --  LABPROT 30.5* -- 30.3*  INR 2.87* -- 2.84*  HEPARINUNFRC -- -- --  CREATININE 0.83 -- 0.74  CKTOTAL -- 75 --  CKMB -- 6.5* --  TROPONINI -- <0.30 --   The CrCl is unknown because both a height and weight (above a minimum accepted value) are required for this calculation.   Medications:  Prescriptions prior to admission  Medication Sig Dispense Refill  . albuterol (PROVENTIL HFA;VENTOLIN HFA) 108 (90 BASE) MCG/ACT inhaler Inhale 1-2 puffs into the lungs every 6 (six) hours as needed. Shortness of breath      . albuterol (PROVENTIL) (2.5 MG/3ML) 0.083% nebulizer solution Take 2.5 mg by nebulization 4 (four) times daily.       Marland Kitchen amLODipine (NORVASC) 5 MG tablet Take 5 mg by mouth daily.        . budesonide (PULMICORT) 0.5 MG/2ML nebulizer solution Take 0.5 mg by nebulization 2 (two) times daily.       . candesartan (ATACAND) 32 MG tablet Take 32 mg by mouth daily.        . carvedilol (COREG) 12.5 MG tablet Take 12.5 mg by mouth 2 (two) times daily with a meal.        . digoxin (LANOXIN) 0.125 MG tablet Take 125 mcg by mouth daily.        . ferrous sulfate 325 (65 FE) MG tablet Take 325 mg by mouth daily with breakfast.      .  furosemide (LASIX) 40 MG tablet Take 2 tabs by mouth in the morning and 1 tab in the evening  270 tablet  3  . guaiFENesin (MUCINEX) 600 MG 12 hr tablet Take 600 mg by mouth 2 (two) times daily as needed. congestion      . HYDROcodone-acetaminophen (VICODIN) 2.5-500 MG per tablet Take 1 tablet by mouth every 6 (six) hours as needed. Back pain      . nitroGLYCERIN (NITRODUR - DOSED IN MG/24 HR) 0.2 mg/hr Place 1 patch onto the skin daily.       . nitroGLYCERIN (NITROSTAT) 0.4 MG SL tablet Place 0.4 mg under the tongue every 5 (five) minutes as needed. May repeat x3       . potassium  chloride SA (K-DUR,KLOR-CON) 20 MEQ tablet Take 20 mEq by mouth daily.        . predniSONE (DELTASONE) 10 MG tablet TAKE ONE TABLET BY MOUTH ONE TIME DAILY  30 tablet  4  . simvastatin (ZOCOR) 20 MG tablet Take 20 mg by mouth at bedtime.        Marland Kitchen tiotropium (SPIRIVA) 18 MCG inhalation capsule Place 18 mcg into inhaler and inhale daily.        Marland Kitchen warfarin (COUMADIN) 2.5 MG tablet Take 2.5 mg by mouth See admin instructions. Takes 1 1/2 tablets (3.75 mg) daily EXCEPT for 1 tablet (2.5 mg) on Fridays only.      Marland Kitchen DISCONTD: albuterol (PROVENTIL HFA) 108 (90 BASE) MCG/ACT inhaler Inhale 1-2 puffs into the lungs every 6 (six) hours as needed.  3 Inhaler  3   Scheduled:    . ipratropium  0.5 mg Nebulization Q6H   And  . albuterol  2.5 mg Nebulization Q6H  . amLODipine  5 mg Oral Daily  . carvedilol  12.5 mg Oral BID WC  . digoxin  125 mcg Oral QAC breakfast  . ferrous sulfate  325 mg Oral Q breakfast  . furosemide  40 mg Oral BID  . methylPREDNISolone (SOLU-MEDROL) injection  80 mg Intravenous Q8H  . moxifloxacin  400 mg Intravenous Q24H  . nitroGLYCERIN  0.2 mg Transdermal Daily  . olmesartan  20 mg Oral Daily  . potassium chloride SA  20 mEq Oral Daily  . simvastatin  20 mg Oral QHS  . DISCONTD: albuterol  2.5 mg Nebulization Q4H  . DISCONTD: albuterol  2.5 mg Nebulization Q6H  . DISCONTD: ipratropium  0.5 mg Nebulization Q4H  . DISCONTD: ipratropium  0.5 mg Nebulization Q4H

## 2011-07-13 NOTE — Progress Notes (Deleted)
Inpatient Diabetes Program Recommendations  AACE/ADA: New Consensus Statement on Inpatient Glycemic Control (2009)  Target Ranges:  Prepandial:   less than 140 mg/dL      Peak postprandial:   less than 180 mg/dL (1-2 hours)      Critically ill patients:  140 - 180 mg/dL   Reason for Visit:Blood sugar greater than 180 mg/dl  Inpatient Diabetes Program Recommendations Correction (SSI): Start Novolog SENSITIVE correction scale AC & HS if CBGs continue greater than 180 mg/dl.  Check CBGs AC & HS HgbA1C: Check HgbA1C for home blood glucose control.    Note:

## 2011-07-13 NOTE — Progress Notes (Signed)
Inpatient Diabetes Program Recommendations  AACE/ADA: New Consensus Statement on Inpatient Glycemic Control (2009)  Target Ranges:  Prepandial:   less than 140 mg/dL      Peak postprandial:   less than 180 mg/dL (1-2 hours)      Critically ill patients:  140 - 180 mg/dL   Reason for Visit: Serum glucose 231 mg/dl  1/61/09  Inpatient Diabetes Program Recommendations Correction (SSI): Start Novolog SENSITIVE correction scale AC & HS if CBGs continue greater than 180 mg/dl.  Check CBGs AC & HS HgbA1C: Check HgbA1C for home blood glucose control.    Note:

## 2011-07-14 LAB — PROTIME-INR: Prothrombin Time: 34.1 seconds — ABNORMAL HIGH (ref 11.6–15.2)

## 2011-07-14 MED ORDER — METHYLPREDNISOLONE SODIUM SUCC 40 MG IJ SOLR
40.0000 mg | Freq: Three times a day (TID) | INTRAMUSCULAR | Status: DC
  Administered 2011-07-14 – 2011-07-15 (×3): 40 mg via INTRAVENOUS
  Filled 2011-07-14 (×6): qty 1

## 2011-07-14 MED ORDER — BIOTENE DRY MOUTH MT LIQD
15.0000 mL | Freq: Two times a day (BID) | OROMUCOSAL | Status: DC
  Administered 2011-07-14 – 2011-07-17 (×7): 15 mL via OROMUCOSAL

## 2011-07-14 MED ORDER — MOXIFLOXACIN HCL 400 MG PO TABS
400.0000 mg | ORAL_TABLET | Freq: Every day | ORAL | Status: DC
  Administered 2011-07-14 – 2011-07-16 (×3): 400 mg via ORAL
  Filled 2011-07-14 (×5): qty 1

## 2011-07-14 MED ORDER — WARFARIN 0.5 MG HALF TABLET
0.5000 mg | ORAL_TABLET | Freq: Once | ORAL | Status: AC
  Administered 2011-07-14: 0.5 mg via ORAL
  Filled 2011-07-14 (×2): qty 1

## 2011-07-14 NOTE — Progress Notes (Signed)
Subjective: Pt much improved.  No increased wob.  Bs mildly elevated due to steroids.  Should improve as we wean. Walking in room, and wishes to walk in halls today.  Objective: Vital signs in last 24 hours: Blood pressure 148/61, pulse 72, temperature 98 F (36.7 C), temperature source Oral, resp. rate 18, height 5\' 6"  (1.676 m), weight 81.1 kg (178 lb 12.7 oz), SpO2 94.00%.  Intake/Output from previous day: 01/10 0701 - 01/11 0700 In: 1990 [P.O.:1740; IV Piggyback:250] Out: -    Physical Exam: Wd male in nad Chest with decreased bs, no wheezing Cor with rrr, distant hs Abd benign LE without edema, no cyanosis Alert moves all 4.   Lab Results:  [facility] 07/13/11 0535 07/12/11 2007  WBC 6.7 7.8  HGB 11.0* 11.4*  HCT 34.0* 34.3*  PLT 220 239   BMET  Basename 07/13/11 0535 07/12/11 2007  NA 132* 133*  K 4.6 4.0  CL 93* 94*  CO2 29 30  GLUCOSE 231* 136*  BUN 21 18  CREATININE 0.83 0.74  CALCIUM 9.0 9.0    Studies/Results: Dg Chest 2 View  07/12/2011  *RADIOLOGY REPORT*  Clinical Data: Shortness of breath and cough  CHEST - 2 VIEW  Comparison: 06/23/2011  Findings: The lungs are clear without focal infiltrate, edema, pneumothorax or pleural effusion. Interstitial markings are diffusely coarsened with chronic features. Cardiopericardial silhouette is at upper limits of normal for size.Left-sided pacer / AICD remains in place nipple shadows project over each lower lung. Bones are diffusely demineralized.  IMPRESSION: Stable.  No new or acute findings.  Original Report Authenticated By: ERIC A. MANSELL, M.D.   Dg Chest Port 1 View  07/12/2011  *RADIOLOGY REPORT*  Clinical Data: Post procedure.  At the degrading.  PORTABLE CHEST - 1 VIEW  Comparison: 07/12/2011, earlier the same day  Findings: 2027 hours. The lungs are clear without focal infiltrate, edema, pneumothorax or pleural effusion. The cardiopericardial silhouette is enlarged.  Left-sided pacer / AICD remains in place.   IMPRESSION: Stable.  No new or acute interval findings.  Original Report Authenticated By: ERIC A. MANSELL, M.D.    Assessment/Plan: Patient Active Hospital Problem List:  DISEASE, ISCHEMIC HEART, CHRONIC NEC (04/16/2007)   Assessment: stable during admit.  No evidence for decompensated chf.   CHRONIC OBSTRUCTIVE PULMONARY DISEASE, ACUTE EXACERBATION (05/06/2010)   Assessment: pt is doing well, and not far from baseline.  Would like to see how he does today walking in halls.  Will convert him to oral abx, and wean steroids.  Anticipate possible d/c in am or Sunday.    Barbaraann Share, M.D. 07/14/2011, 8:03 AM

## 2011-07-14 NOTE — Progress Notes (Signed)
ANTICOAGULATION CONSULT NOTE - Follow Up Consult  Pharmacy Consult for Coumadin Indication: atrial fibrillation  Admit Complaint: increased dyspnea and cough Pharmacist System-Based Medication Review: Anticoagulation: Warfarin for Afib resumed from PTA. INR 3.31 << 2.87. Jump in INR likely d/t high dose steroids/FQ for COPD exacerbation. Home dose verified as 3.75 mg daily EXCEPT for 2.5 mg on Fridays only. Hgb/Hct/Plt stable. No s/sx of bleeding noted. Pt re-educated. Infectious Disease: Avelox MD#3 for COPD exacerbation. Afebrile, WBC WNL. Dose appropriate (not renally adjusted) Cardiovascular: Hx HTN/PVD/CHF/DL/CAD/Afib. Pt has ICD in place. BP/24h: 150-160/50-60, HR: 60-70. No med changes. On home norvasc, coreg, digoxin, lasix/kcl, simvastatin, NTG patch. Also continued on ARB--olmesartan (formulary sub for PTA candesartan).   Endocrinology: No hx DM however A1c 7.5 (12/03/10). CBG/24h: 136-231. (elevation may be related to steroid use for COPD). On no SSI Pulmonary: Hx COPD/emphysema (on home oxygen therapy) and admitted with COPD exacerbation. Solumedrol decreased to 40 mg q8h. Continues on albuterol/atrovent nebs q6h,  avelox daily (PTA tiotropium, budesonide currently on hold)) Best Practices: Warfarin for VTE px, home meds addressed  Assessment: 76 y.o. M on warfarin for Afib with a SUPRAtherapeutic INR this a.m. (INR 3.31 <<2.87, goal of 2-3). The patient has been resumed on his home dose since he was therapeutic on admission. Jump in INR likely related to IV steroids and FQ use. No CBC today, no s/sx of bleeding noted. Patient re-educated on warfarin.  Goal of Therapy:  INR 2-3  Plan:  1. Warfarin 0.5 mg x 1 dose at 1800. 2. Will continue to monitor for any signs/symptoms of bleeding and will follow up with PT/INR in the a.m.   Georgina Pillion, PharmD, BCPS 07/14/2011 10:52 AM     No Known Allergies  Patient Measurements: Height: 5\' 6"  (167.6 cm) Weight: 178 lb 12.7 oz (81.1  kg) IBW/kg (Calculated) : 63.8    Vital Signs: Temp: 98.6 F (37 C) (01/11 0853) Temp src: Oral (01/11 0853) BP: 156/55 mmHg (01/11 0853) Pulse Rate: 68  (01/11 0853)  Labs:  Basename 07/14/11 0605 07/13/11 0535 07/12/11 2008 07/12/11 2007  HGB -- 11.0* -- 11.4*  HCT -- 34.0* -- 34.3*  PLT -- 220 -- 239  APTT -- -- -- --  LABPROT 34.1* 30.5* -- 30.3*  INR 3.31* 2.87* -- 2.84*  HEPARINUNFRC -- -- -- --  CREATININE -- 0.83 -- 0.74  CKTOTAL -- -- 75 --  CKMB -- -- 6.5* --  TROPONINI -- -- <0.30 --   Estimated Creatinine Clearance: 79.3 ml/min (by C-G formula based on Cr of 0.83).   Medications:  Prescriptions prior to admission  Medication Sig Dispense Refill  . albuterol (PROVENTIL HFA;VENTOLIN HFA) 108 (90 BASE) MCG/ACT inhaler Inhale 1-2 puffs into the lungs every 6 (six) hours as needed. Shortness of breath      . albuterol (PROVENTIL) (2.5 MG/3ML) 0.083% nebulizer solution Take 2.5 mg by nebulization 4 (four) times daily.       Marland Kitchen amLODipine (NORVASC) 5 MG tablet Take 5 mg by mouth daily.        . budesonide (PULMICORT) 0.5 MG/2ML nebulizer solution Take 0.5 mg by nebulization 2 (two) times daily.       . candesartan (ATACAND) 32 MG tablet Take 32 mg by mouth daily.        . carvedilol (COREG) 12.5 MG tablet Take 12.5 mg by mouth 2 (two) times daily with a meal.        . digoxin (LANOXIN) 0.125 MG tablet Take 125 mcg by  mouth daily.        . ferrous sulfate 325 (65 FE) MG tablet Take 325 mg by mouth daily with breakfast.      . furosemide (LASIX) 40 MG tablet Take 2 tabs by mouth in the morning and 1 tab in the evening  270 tablet  3  . guaiFENesin (MUCINEX) 600 MG 12 hr tablet Take 600 mg by mouth 2 (two) times daily as needed. congestion      . HYDROcodone-acetaminophen (VICODIN) 2.5-500 MG per tablet Take 1 tablet by mouth every 6 (six) hours as needed. Back pain      . nitroGLYCERIN (NITRODUR - DOSED IN MG/24 HR) 0.2 mg/hr Place 1 patch onto the skin daily.       .  nitroGLYCERIN (NITROSTAT) 0.4 MG SL tablet Place 0.4 mg under the tongue every 5 (five) minutes as needed. May repeat x3       . potassium chloride SA (K-DUR,KLOR-CON) 20 MEQ tablet Take 20 mEq by mouth daily.        . predniSONE (DELTASONE) 10 MG tablet TAKE ONE TABLET BY MOUTH ONE TIME DAILY  30 tablet  4  . simvastatin (ZOCOR) 20 MG tablet Take 20 mg by mouth at bedtime.        Marland Kitchen tiotropium (SPIRIVA) 18 MCG inhalation capsule Place 18 mcg into inhaler and inhale daily.        Marland Kitchen warfarin (COUMADIN) 2.5 MG tablet Take 2.5 mg by mouth See admin instructions. Takes 1 1/2 tablets (3.75 mg) daily EXCEPT for 1 tablet (2.5 mg) on Fridays only.      Marland Kitchen DISCONTD: albuterol (PROVENTIL HFA) 108 (90 BASE) MCG/ACT inhaler Inhale 1-2 puffs into the lungs every 6 (six) hours as needed.  3 Inhaler  3   Scheduled:     . ipratropium  0.5 mg Nebulization Q6H   And  . albuterol  2.5 mg Nebulization Q6H  . amLODipine  5 mg Oral Daily  . antiseptic oral rinse  15 mL Mouth Rinse BID  . carvedilol  12.5 mg Oral BID WC  . digoxin  125 mcg Oral QAC breakfast  . ferrous sulfate  325 mg Oral Q breakfast  . furosemide  40 mg Oral BID  . methylPREDNISolone (SOLU-MEDROL) injection  40 mg Intravenous Q8H  . moxifloxacin  400 mg Oral q1800  . nitroGLYCERIN  0.2 mg Transdermal Daily  . olmesartan  20 mg Oral Daily  . potassium chloride SA  20 mEq Oral Daily  . simvastatin  20 mg Oral QHS  . warfarin  3.75 mg Oral ONCE-1800  . DISCONTD: methylPREDNISolone (SOLU-MEDROL) injection  80 mg Intravenous Q8H  . DISCONTD: moxifloxacin  400 mg Intravenous Q24H

## 2011-07-15 DIAGNOSIS — E1165 Type 2 diabetes mellitus with hyperglycemia: Secondary | ICD-10-CM

## 2011-07-15 DIAGNOSIS — I25810 Atherosclerosis of coronary artery bypass graft(s) without angina pectoris: Secondary | ICD-10-CM

## 2011-07-15 DIAGNOSIS — I509 Heart failure, unspecified: Secondary | ICD-10-CM

## 2011-07-15 DIAGNOSIS — J441 Chronic obstructive pulmonary disease with (acute) exacerbation: Secondary | ICD-10-CM

## 2011-07-15 MED ORDER — INSULIN ASPART 100 UNIT/ML ~~LOC~~ SOLN
1.0000 [IU] | Freq: Three times a day (TID) | SUBCUTANEOUS | Status: DC
  Administered 2011-07-15 – 2011-07-16 (×5): 4 [IU] via SUBCUTANEOUS
  Administered 2011-07-17: 3 [IU] via SUBCUTANEOUS
  Filled 2011-07-15: qty 3

## 2011-07-15 MED ORDER — PREDNISONE 20 MG PO TABS
20.0000 mg | ORAL_TABLET | Freq: Two times a day (BID) | ORAL | Status: DC
  Administered 2011-07-15 – 2011-07-16 (×3): 20 mg via ORAL
  Filled 2011-07-15 (×5): qty 1

## 2011-07-15 NOTE — Progress Notes (Signed)
Subjective: Pt much improved.  No increased wob.  Bs mildly elevated due to steroids.  Should improve as we wean. Walking in room, and wishes to walk in halls today.  Objective: Vital signs in last 24 hours: Blood pressure 141/59, pulse 79, temperature 98.1 F (36.7 C), temperature source Oral, resp. rate 20, height 5\' 6"  (1.676 m), weight 84.006 kg (185 lb 3.2 oz), SpO2 92.00%.  Intake/Output from previous day: 01/11 0701 - 01/12 0700 In: 1080 [P.O.:1080] Out: -   Physical Exam: Wd male in nad Chest with decreased bs, no wheezing Cor with rrr, distant hs Abd benign LE without edema, no cyanosis Alert moves all 4.   Lab Results:  Basename 07/13/11 0535 07/12/11 2007  WBC 6.7 7.8  HGB 11.0* 11.4*  HCT 34.0* 34.3*  PLT 220 239   BMET  Basename 07/13/11 0535 07/12/11 2007  NA 132* 133*  K 4.6 4.0  CL 93* 94*  CO2 29 30  GLUCOSE 231* 136*  BUN 21 18  CREATININE 0.83 0.74  CALCIUM 9.0 9.0    Studies/Results: CXR 1/9>> IMPRESSION:  Stable. No new or acute interval findings. The cardiopericardial  silhouette is enlarged. Left-sided pacer / AICD remains in place.   Assessment/Plan: Patient Active Hospital Problem List:  DISEASE, ISCHEMIC HEART, CHRONIC NEC (04/16/2007) Assessment: stable during admit.  No evidence for decompensated chf, on Lasix 40mg Bid... Will check BNP.  CHRONIC OBSTRUCTIVE PULMONARY DISEASE, ACUTE EXACERBATION (05/06/2010) Assessment:  1/12>> pt is doing well, and not far from baseline, he says... On Nebs, Solumedrol 40mg  Q8H, Avelox po... PLAN: DC the IV Solumedrol in favor of oral Pred... Ambulate w/ O2 (he has it at home & uses it prn)  ELEV Blood Sugar>>  Note: BS 6/12= 105-207;  BS 2011= 120-128 PLAN:  Check accuchecks and A1c, & order SSI... Should improve off the Solumedrol but he has DM...  Overanticoag>> on Coumadin pharm protocol... 1/12>> Protime37.4/ INR3.72   Lonzo Cloud. Kriste Basque, M.D. 07/15/2011, 9:04 AM

## 2011-07-15 NOTE — Progress Notes (Signed)
ANTICOAGULATION CONSULT NOTE - Follow Up Consult  Pharmacy Consult for Coumadin Indication: atrial fibrillation  Assessment: 76 yo male on warfarin PTA for Afib. INR remains SUPRAtherapeutic at 3.72-->3.31, but was in goal range on admission. Jump in INR likely related to IV steroids and FQ use.  No new labs to evaluate, no bleeding issues reported. Home dose is 3.75mg  daily except 2.5mg  on Fridays only.  Pharmacist System-Based Medication Review: Infectious Disease: Avelox MD#4 for COPD exacerbation. Afebrile, WBC WNL. Dose appropriate (not renally adjusted) Cardiovascular: Hx HTN/PVD/CHF/DL/CAD/Afib. Pt has ICD in place. BP/24h: hypertensive, No med changes. On home norvasc, coreg, digoxin, lasix/kcl, simvastatin, NTG patch. Also continued on ARB--olmesartan (formulary sub for PTA candesartan). No evidence of CHF decompensation. Endocrinology: No hx/o DM however CBG/24h: 136-231. (elevation may be related to steroid use for COPD), on SSI Nephrology: SCr 0.83, CrCl~97ml/min. Lytes wnl, meds adjusted for renal function Pulmonary: Hx COPD/emphysema (on home oxygen therapy) and admitted with COPD exacerbation. Plan to dc IV solumedrol and continue on oral prednisone. Continues on albuterol/atrovent nebs q6h, avelox daily  PTA Medication Issues: PTA tiotropium & budesonide currently on hold Best Practices: Warfarin for VTE px, home meds addressed  Goal of Therapy:  INR 2-3  Plan:  1. Hold coumadin today. Check INR in AM. 2. Continue to monitor for any signs/symptoms of bleeding.  Bayard Beaver. Saul Fordyce, PharmD  07/15/2011 12:14 PM   No Known Allergies  Patient Measurements: Height: 5\' 6"  (167.6 cm) Weight: 185 lb 3.2 oz (84.006 kg) IBW/kg (Calculated) : 63.8    Vital Signs: Temp: 98.2 F (36.8 C) (01/12 1005) Temp src: Oral (01/12 1005) BP: 161/73 mmHg (01/12 1005) Pulse Rate: 63  (01/12 1005)  Labs:  Basename 07/15/11 0500 07/14/11 0605 07/13/11 0535 07/12/11 2008 07/12/11 2007    HGB -- -- 11.0* -- 11.4*  HCT -- -- 34.0* -- 34.3*  PLT -- -- 220 -- 239  APTT -- -- -- -- --  LABPROT 37.4* 34.1* 30.5* -- --  INR 3.72* 3.31* 2.87* -- --  HEPARINUNFRC -- -- -- -- --  CREATININE -- -- 0.83 -- 0.74  CKTOTAL -- -- -- 75 --  CKMB -- -- -- 6.5* --  TROPONINI -- -- -- <0.30 --   Estimated Creatinine Clearance: 80.6 ml/min (by C-G formula based on Cr of 0.83).   Medications:  Prescriptions prior to admission  Medication Sig Dispense Refill  . albuterol (PROVENTIL HFA;VENTOLIN HFA) 108 (90 BASE) MCG/ACT inhaler Inhale 1-2 puffs into the lungs every 6 (six) hours as needed. Shortness of breath      . albuterol (PROVENTIL) (2.5 MG/3ML) 0.083% nebulizer solution Take 2.5 mg by nebulization 4 (four) times daily.       Marland Kitchen amLODipine (NORVASC) 5 MG tablet Take 5 mg by mouth daily.        . budesonide (PULMICORT) 0.5 MG/2ML nebulizer solution Take 0.5 mg by nebulization 2 (two) times daily.       . candesartan (ATACAND) 32 MG tablet Take 32 mg by mouth daily.        . carvedilol (COREG) 12.5 MG tablet Take 12.5 mg by mouth 2 (two) times daily with a meal.        . digoxin (LANOXIN) 0.125 MG tablet Take 125 mcg by mouth daily.        . ferrous sulfate 325 (65 FE) MG tablet Take 325 mg by mouth daily with breakfast.      . furosemide (LASIX) 40 MG tablet Take 2 tabs by  mouth in the morning and 1 tab in the evening  270 tablet  3  . guaiFENesin (MUCINEX) 600 MG 12 hr tablet Take 600 mg by mouth 2 (two) times daily as needed. congestion      . HYDROcodone-acetaminophen (VICODIN) 2.5-500 MG per tablet Take 1 tablet by mouth every 6 (six) hours as needed. Back pain      . nitroGLYCERIN (NITRODUR - DOSED IN MG/24 HR) 0.2 mg/hr Place 1 patch onto the skin daily.       . nitroGLYCERIN (NITROSTAT) 0.4 MG SL tablet Place 0.4 mg under the tongue every 5 (five) minutes as needed. May repeat x3       . potassium chloride SA (K-DUR,KLOR-CON) 20 MEQ tablet Take 20 mEq by mouth daily.        .  predniSONE (DELTASONE) 10 MG tablet TAKE ONE TABLET BY MOUTH ONE TIME DAILY  30 tablet  4  . simvastatin (ZOCOR) 20 MG tablet Take 20 mg by mouth at bedtime.        Marland Kitchen tiotropium (SPIRIVA) 18 MCG inhalation capsule Place 18 mcg into inhaler and inhale daily.        Marland Kitchen warfarin (COUMADIN) 2.5 MG tablet Take 2.5 mg by mouth See admin instructions. Takes 1 1/2 tablets (3.75 mg) daily EXCEPT for 1 tablet (2.5 mg) on Fridays only.      Marland Kitchen DISCONTD: albuterol (PROVENTIL HFA) 108 (90 BASE) MCG/ACT inhaler Inhale 1-2 puffs into the lungs every 6 (six) hours as needed.  3 Inhaler  3   Scheduled:     . ipratropium  0.5 mg Nebulization Q6H   And  . albuterol  2.5 mg Nebulization Q6H  . amLODipine  5 mg Oral Daily  . antiseptic oral rinse  15 mL Mouth Rinse BID  . carvedilol  12.5 mg Oral BID WC  . digoxin  125 mcg Oral QAC breakfast  . ferrous sulfate  325 mg Oral Q breakfast  . furosemide  40 mg Oral BID  . insulin aspart  1-4 Units Subcutaneous TID WC  . moxifloxacin  400 mg Oral q1800  . nitroGLYCERIN  0.2 mg Transdermal Daily  . olmesartan  20 mg Oral Daily  . potassium chloride SA  20 mEq Oral Daily  . predniSONE  20 mg Oral BID WC  . simvastatin  20 mg Oral QHS  . warfarin  0.5 mg Oral ONCE-1800  . DISCONTD: methylPREDNISolone (SOLU-MEDROL) injection  40 mg Intravenous Q8H

## 2011-07-16 DIAGNOSIS — E1165 Type 2 diabetes mellitus with hyperglycemia: Secondary | ICD-10-CM

## 2011-07-16 DIAGNOSIS — I25810 Atherosclerosis of coronary artery bypass graft(s) without angina pectoris: Secondary | ICD-10-CM

## 2011-07-16 DIAGNOSIS — I509 Heart failure, unspecified: Secondary | ICD-10-CM

## 2011-07-16 LAB — BASIC METABOLIC PANEL
Calcium: 8.6 mg/dL (ref 8.4–10.5)
GFR calc Af Amer: >90 mL/min (ref >90)
GFR calc non Af Amer: 89 mL/min — ABNORMAL LOW (ref >90)
Sodium: 129 mEq/L — ABNORMAL LOW (ref 135–145)

## 2011-07-16 LAB — PROTIME-INR: INR: 2.57 — ABNORMAL HIGH (ref 0.00–1.49)

## 2011-07-16 LAB — HEMOGLOBIN A1C
Hgb A1c MFr Bld: 7 % — ABNORMAL HIGH (ref <5.7)
Mean Plasma Glucose: 154 mg/dL — ABNORMAL HIGH (ref <117)

## 2011-07-16 LAB — GLUCOSE, CAPILLARY: Glucose-Capillary: 197 mg/dL — ABNORMAL HIGH (ref 70–99)

## 2011-07-16 MED ORDER — WARFARIN SODIUM 2.5 MG PO TABS
3.7500 mg | ORAL_TABLET | Freq: Once | ORAL | Status: AC
  Administered 2011-07-16: 3.75 mg via ORAL
  Filled 2011-07-16: qty 1

## 2011-07-16 MED ORDER — METFORMIN HCL 500 MG PO TABS
500.0000 mg | ORAL_TABLET | Freq: Two times a day (BID) | ORAL | Status: DC
  Administered 2011-07-16 – 2011-07-17 (×3): 500 mg via ORAL
  Filled 2011-07-16 (×5): qty 1

## 2011-07-16 MED ORDER — PREDNISONE 20 MG PO TABS
20.0000 mg | ORAL_TABLET | Freq: Every day | ORAL | Status: DC
  Administered 2011-07-17: 20 mg via ORAL
  Filled 2011-07-16 (×3): qty 1

## 2011-07-16 NOTE — Progress Notes (Signed)
ANTICOAGULATION CONSULT NOTE - Follow Up Consult  Pharmacy Consult for Coumadin Indication: atrial fibrillation  Assessment: 76 yo male on warfarin PTA for Afib. INR today is therapeutic at 2.57-->3.72. Jump in INR likely related to IV steroids and FQ use.  No new labs to evaluate, no bleeding issues reported. Home dose is 3.75mg  daily except 2.5mg  on Fridays only.  Pharmacist System-Based Medication Review: Infectious Disease: Avelox MD#5 for COPD exacerbation. Afebrile, WBC WNL. Dose appropriate (not renally adjusted) Cardiovascular: Hx HTN/PVD/CHF/DL/CAD/Afib. Pt has ICD in place. VSS, No med changes. On home norvasc, coreg, digoxin, lasix/kcl, simvastatin, NTG patch. Also continued on ARB--olmesartan (formulary sub for PTA candesartan). No evidence of CHF decompensation. Endocrinology: No hx/o DM however CBG/24h: 200-300s. (elevation may be related to steroid use for COPD), on SSI, metformin added Nephrology: SCr 0.75, CrCl~49ml/min. Lytes wnl, meds adjusted for renal function Pulmonary: Hx COPD/emphysema (on home oxygen therapy) and admitted with COPD exacerbation. continues on oral prednisone. Continues on albuterol/atrovent nebs q6h, avelox daily  PTA Medication Issues: PTA tiotropium & budesonide currently on hold Best Practices: Warfarin for VTE px, home meds addressed  Goal of Therapy:  INR 2-3  Plan:  Coumadin 3.75mg  PO x1 today.  Check INR in AM.  Bayard Beaver. Saul Fordyce, PharmD  07/16/2011 1:01 PM   No Known Allergies  Patient Measurements: Height: 5\' 6"  (167.6 cm) Weight: 185 lb 3.2 oz (84.006 kg) IBW/kg (Calculated) : 63.8    Vital Signs: Temp: 97.4 F (36.3 C) (01/13 0900) Temp src: Oral (01/13 0900) BP: 140/66 mmHg (01/13 0900) Pulse Rate: 72  (01/13 0900)  Labs:  Basename 07/16/11 0630 07/15/11 0500 07/14/11 0605  HGB -- -- --  HCT -- -- --  PLT -- -- --  APTT -- -- --  LABPROT 28.0* 37.4* 34.1*  INR 2.57* 3.72* 3.31*  HEPARINUNFRC -- -- --  CREATININE  0.75 -- --  CKTOTAL -- -- --  CKMB -- -- --  TROPONINI -- -- --   Estimated Creatinine Clearance: 83.6 ml/min (by C-G formula based on Cr of 0.75).   Medications:  Prescriptions prior to admission  Medication Sig Dispense Refill  . albuterol (PROVENTIL HFA;VENTOLIN HFA) 108 (90 BASE) MCG/ACT inhaler Inhale 1-2 puffs into the lungs every 6 (six) hours as needed. Shortness of breath      . albuterol (PROVENTIL) (2.5 MG/3ML) 0.083% nebulizer solution Take 2.5 mg by nebulization 4 (four) times daily.       Marland Kitchen amLODipine (NORVASC) 5 MG tablet Take 5 mg by mouth daily.        . budesonide (PULMICORT) 0.5 MG/2ML nebulizer solution Take 0.5 mg by nebulization 2 (two) times daily.       . candesartan (ATACAND) 32 MG tablet Take 32 mg by mouth daily.        . carvedilol (COREG) 12.5 MG tablet Take 12.5 mg by mouth 2 (two) times daily with a meal.        . digoxin (LANOXIN) 0.125 MG tablet Take 125 mcg by mouth daily.        . ferrous sulfate 325 (65 FE) MG tablet Take 325 mg by mouth daily with breakfast.      . furosemide (LASIX) 40 MG tablet Take 2 tabs by mouth in the morning and 1 tab in the evening  270 tablet  3  . guaiFENesin (MUCINEX) 600 MG 12 hr tablet Take 600 mg by mouth 2 (two) times daily as needed. congestion      . HYDROcodone-acetaminophen (VICODIN) 2.5-500  MG per tablet Take 1 tablet by mouth every 6 (six) hours as needed. Back pain      . nitroGLYCERIN (NITRODUR - DOSED IN MG/24 HR) 0.2 mg/hr Place 1 patch onto the skin daily.       . nitroGLYCERIN (NITROSTAT) 0.4 MG SL tablet Place 0.4 mg under the tongue every 5 (five) minutes as needed. May repeat x3       . potassium chloride SA (K-DUR,KLOR-CON) 20 MEQ tablet Take 20 mEq by mouth daily.        . predniSONE (DELTASONE) 10 MG tablet TAKE ONE TABLET BY MOUTH ONE TIME DAILY  30 tablet  4  . simvastatin (ZOCOR) 20 MG tablet Take 20 mg by mouth at bedtime.        Marland Kitchen tiotropium (SPIRIVA) 18 MCG inhalation capsule Place 18 mcg into  inhaler and inhale daily.        Marland Kitchen warfarin (COUMADIN) 2.5 MG tablet Take 2.5 mg by mouth See admin instructions. Takes 1 1/2 tablets (3.75 mg) daily EXCEPT for 1 tablet (2.5 mg) on Fridays only.      Marland Kitchen DISCONTD: albuterol (PROVENTIL HFA) 108 (90 BASE) MCG/ACT inhaler Inhale 1-2 puffs into the lungs every 6 (six) hours as needed.  3 Inhaler  3   Scheduled:     . ipratropium  0.5 mg Nebulization Q6H   And  . albuterol  2.5 mg Nebulization Q6H  . amLODipine  5 mg Oral Daily  . antiseptic oral rinse  15 mL Mouth Rinse BID  . carvedilol  12.5 mg Oral BID WC  . digoxin  125 mcg Oral QAC breakfast  . ferrous sulfate  325 mg Oral Q breakfast  . furosemide  40 mg Oral BID  . insulin aspart  1-4 Units Subcutaneous TID WC  . metFORMIN  500 mg Oral BID WC  . moxifloxacin  400 mg Oral q1800  . nitroGLYCERIN  0.2 mg Transdermal Daily  . olmesartan  20 mg Oral Daily  . potassium chloride SA  20 mEq Oral Daily  . predniSONE  20 mg Oral Q breakfast  . simvastatin  20 mg Oral QHS  . DISCONTD: predniSONE  20 mg Oral BID WC

## 2011-07-16 NOTE — Progress Notes (Signed)
Subjective: Pt much improved.  No increased wob.  Anxious for disch but understands about ned for DM assessment & rx...  Objective: Vital signs in last 24 hours: Blood pressure 140/66, pulse 72, temperature 97.4 F (36.3 C), temperature source Oral, resp. rate 19, height 5\' 6"  (1.676 m), weight 84.006 kg (185 lb 3.2 oz), SpO2 98.00%.  Intake/Output from previous day: 01/12 0701 - 01/13 0700 In: 582 [P.O.:582] Out: 2 [Urine:2]  Physical Exam: Wd male in nad Chest with decreased bs, no wheezing Cor with rrr, distant hs Abd benign LE without edema, no cyanosis Alert moves all 4.   Lab Results:  BMET  Basename 07/16/11 0630  NA 129*  K 4.2  CL 93*  CO2 28  GLUCOSE 288*  BUN 21  CREATININE 0.75  CALCIUM 8.6    LABS 1/13 >>  BS= 168-369 Protime 28.0/ INR 2.57 BNP= 856 A1c= pending   Studies/Results: CXR 1/9>> IMPRESSION:  Stable. No new or acute interval findings. The cardiopericardial  silhouette is enlarged. Left-sided pacer / AICD remains in place.   Assessment/Plan: Patient Active Hospital Problem List:  DISEASE, ISCHEMIC HEART, CHRONIC NEC (04/16/2007) Assessment: stable during admit.  No evidence for decompensated chf, on Lasix 40mg Bid... Will check BNP==> 856  CHRONIC OBSTRUCTIVE PULMONARY DISEASE, ACUTE EXACERBATION (05/06/2010) Assessment:  1/12>> pt is doing well, and not far from baseline, he says... On Nebs, Solumedrol 40mg  Q8H, Avelox po... PLAN: DC the IV Solumedrol in favor of oral Pred 20Bid... Ambulate w/ O2 (he has it at home & uses it prn) 1/13>> need to wean Pred further (20Qam) in light of worsening BS  ELEV Blood Sugar>>  DM Note: BS 6/12= 105-207;  BS 2011= 120-128 PLAN:  Check accuchecks and A1c, & order SSI... Should improve off the Solumedrol but he has DM... 1/13>> BS up to 369, on SSI, wean Pred faster, add Metformin (500Bid), may need glimepiride as well, adjust diet order...  Note- his LMD is [facility] physician.  Overanticoag>>  on Coumadin pharm protocol... 1/12>> Protime37.4/ INR3.72 1/13>> Protime 28.0/ INR 2.57   Lynnette Pote M. Kriste Basque, M.D. 07/16/2011, 9:17 AM

## 2011-07-17 LAB — PROTIME-INR
INR: 2.46 — ABNORMAL HIGH (ref 0.00–1.49)
Prothrombin Time: 27.1 seconds — ABNORMAL HIGH (ref 11.6–15.2)

## 2011-07-17 LAB — GLUCOSE, CAPILLARY: Glucose-Capillary: 168 mg/dL — ABNORMAL HIGH (ref 70–99)

## 2011-07-17 MED ORDER — METFORMIN HCL 500 MG PO TABS: 500.0000 mg | ORAL_TABLET | Freq: Two times a day (BID) | ORAL | Status: DC

## 2011-07-17 MED ORDER — PREDNISONE 20 MG PO TABS: 20.0000 mg | ORAL_TABLET | Freq: Every day | ORAL | Status: AC

## 2011-07-17 MED ORDER — WARFARIN 1.25 MG HALF TABLET
1.2500 mg | ORAL_TABLET | Freq: Once | ORAL | Status: DC
  Filled 2011-07-17: qty 1

## 2011-07-17 MED ORDER — MOXIFLOXACIN HCL 400 MG PO TABS: 400.0000 mg | ORAL_TABLET | Freq: Every day | ORAL | Status: AC

## 2011-07-17 NOTE — Progress Notes (Signed)
Subjective: Pt doing well, no increased wob.  Wants to go home.  BS much better off Iv steroids.  Objective: Vital signs in last 24 hours: Blood pressure 148/60, pulse 68, temperature 98 F (36.7 C), temperature source Oral, resp. rate 20, height 5\' 6"  (1.676 m), weight 83.6 kg (184 lb 4.9 oz), SpO2 97.00%.  Intake/Output from previous day: 01/13 0701 - 01/14 0700 In: 840 [P.O.:840] Out: 900 [Urine:900]   Physical Exam:   wd male in nad No nasal discharge or purulence Chest with decreased bs, no wheezing Cor with slightly irregular rhythm, cvr LE without edema, no cyanosis Alert, oriented, moves all 4.   Lab Results: No results found for this basename: WBC:3,HGB:3,HCT:3,PLT:3 in the last 72 hours BMET  Basename 07/16/11 0630  NA 129*  K 4.2  CL 93*  CO2 28  GLUCOSE 288*  BUN 21  CREATININE 0.75  CALCIUM 8.6    Studies/Results: No results found.  Assessment/Plan: Patient Active Hospital Problem List:  CHRONIC OBSTRUCTIVE PULMONARY DISEASE, ACUTE EXACERBATION (05/06/2010)   Pt is much improved and back to usual baseline.  Will send home on pred at 20mg /day and taper rapidly.  DM: Always steroid induced with this pt.  Much improved on oral pred and metphormin. Will send home on metaformin, and arrange for f/u with primary md end of week.   Barbaraann Share, M.D. 07/17/2011, 8:15 AM

## 2011-07-17 NOTE — Progress Notes (Addendum)
Patient for discharge home today.  Pt on chronic home oxygen, has O2 tank for transport home, but it is empty.  Home oxygen provided by [facility].  Notified Derrian with [facility]--he will provide pt with a full portable tank for transport home.   Jerrell Belfast, RN, BSN Phone #[phone]

## 2011-07-17 NOTE — Progress Notes (Signed)
ANTICOAGULATION CONSULT NOTE - Follow Up Consult  Pharmacy Consult for Coumadin Indication: atrial fibrillation  Pharmacist System-Based Medication Review: Infectious Disease: Avelox MD#6 for COPD exacerbation. Afebrile, no CBC since 1/9. Dose appropriate (not renally adjusted) Cardiovascular: Hx HTN/PVD/CHF/DL/CAD/Afib. Pt has ICD in place. BP/24h: 1400-150/60, HR: 60-70. No med changes. On home norvasc, coreg, digoxin, lasix/kcl, simvastatin, NTG patch. Also continued on ARB--olmesartan (formulary sub for PTA candesartan). No evidence of CHF decompensation. Endocrinology: No hx/o DM however CBG/24h: 170-270 (elevation may be related to steroid use for COPD). Metformin added on 1/13, SSI d/ced this a.m. Nephrology: SCr 0.75, CrCl~70-80 ml/min. No new BMET today, lytes ok yesterday.  Pulmonary: Hx COPD/emphysema (on home oxygen therapy) and admitted with COPD exacerbation. Continues on steroid taper, currently on prednisone 20 mg daily (PTA was on prednisone 10 mg daily). Continues on albuterol/atrovent nebs q6h, avelox daily  PTA Medication Issues: PTA tiotropium & budesonide currently on hold Best Practices: Warfarin for VTE px, home meds addressed  Assessment: 76 yo male on warfarin PTA for Afib. INR today is therapeutic  (2.46 << 2.57, goal of 2-3). Large jump in INR this admission likely related to IV steroids and FQ use.  No new labs to evaluate, no s/sx of bleeding noted. Home dose is 3.75 mg daily except 2.5mg  on Fridays only. Pt re-educated this admission.  Goal of Therapy:  INR 2-3  Plan:  1. Warfarin 1.25 mg x 1 dose at 1800 today 2. Will continue to monitor for any signs/symptoms of bleeding and will follow up with PT/INR in the a.m. (if still here)  3. Noted plans for possible discharge--if discharged would recommend 1.25 mg daily (1/2 of 2.5 mg tablet) and to have patient follow-up with at the coumadin clinic by Wednesday (07/19/11) as INR difficult to manage at this time with  changes in steroid doses and concurrent antibiotic therapy.  Georgina Pillion, PharmD, BCPS 07/17/2011 9:48 AM    No Known Allergies  Patient Measurements: Height: 5\' 6"  (167.6 cm) Weight: 184 lb 4.9 oz (83.6 kg) IBW/kg (Calculated) : 63.8    Vital Signs: Temp: 98 F (36.7 C) (01/14 0535) Temp src: Oral (01/14 0535) BP: 148/60 mmHg (01/14 0535) Pulse Rate: 68  (01/14 0535)  Labs:  Basename 07/17/11 0540 07/16/11 0630 07/15/11 0500  HGB -- -- --  HCT -- -- --  PLT -- -- --  APTT -- -- --  LABPROT 27.1* 28.0* 37.4*  INR 2.46* 2.57* 3.72*  HEPARINUNFRC -- -- --  CREATININE -- 0.75 --  CKTOTAL -- -- --  CKMB -- -- --  TROPONINI -- -- --   Estimated Creatinine Clearance: 83.4 ml/min (by C-G formula based on Cr of 0.75).   Medications:  Prescriptions prior to admission  Medication Sig Dispense Refill  . albuterol (PROVENTIL HFA;VENTOLIN HFA) 108 (90 BASE) MCG/ACT inhaler Inhale 1-2 puffs into the lungs every 6 (six) hours as needed. Shortness of breath      . albuterol (PROVENTIL) (2.5 MG/3ML) 0.083% nebulizer solution Take 2.5 mg by nebulization 4 (four) times daily.       Marland Kitchen amLODipine (NORVASC) 5 MG tablet Take 5 mg by mouth daily.        . budesonide (PULMICORT) 0.5 MG/2ML nebulizer solution Take 0.5 mg by nebulization 2 (two) times daily.       . candesartan (ATACAND) 32 MG tablet Take 32 mg by mouth daily.        . carvedilol (COREG) 12.5 MG tablet Take 12.5 mg by mouth 2 (  two) times daily with a meal.        . digoxin (LANOXIN) 0.125 MG tablet Take 125 mcg by mouth daily.        . ferrous sulfate 325 (65 FE) MG tablet Take 325 mg by mouth daily with breakfast.      . furosemide (LASIX) 40 MG tablet Take 2 tabs by mouth in the morning and 1 tab in the evening  270 tablet  3  . guaiFENesin (MUCINEX) 600 MG 12 hr tablet Take 600 mg by mouth 2 (two) times daily as needed. congestion      . HYDROcodone-acetaminophen (VICODIN) 2.5-500 MG per tablet Take 1 tablet by mouth  every 6 (six) hours as needed. Back pain      . nitroGLYCERIN (NITRODUR - DOSED IN MG/24 HR) 0.2 mg/hr Place 1 patch onto the skin daily.       . nitroGLYCERIN (NITROSTAT) 0.4 MG SL tablet Place 0.4 mg under the tongue every 5 (five) minutes as needed. May repeat x3       . potassium chloride SA (K-DUR,KLOR-CON) 20 MEQ tablet Take 20 mEq by mouth daily.        . predniSONE (DELTASONE) 10 MG tablet TAKE ONE TABLET BY MOUTH ONE TIME DAILY  30 tablet  4  . simvastatin (ZOCOR) 20 MG tablet Take 20 mg by mouth at bedtime.        Marland Kitchen tiotropium (SPIRIVA) 18 MCG inhalation capsule Place 18 mcg into inhaler and inhale daily.        Marland Kitchen warfarin (COUMADIN) 2.5 MG tablet Take 2.5 mg by mouth See admin instructions. Takes 1 1/2 tablets (3.75 mg) daily EXCEPT for 1 tablet (2.5 mg) on Fridays only.      Marland Kitchen DISCONTD: albuterol (PROVENTIL HFA) 108 (90 BASE) MCG/ACT inhaler Inhale 1-2 puffs into the lungs every 6 (six) hours as needed.  3 Inhaler  3   Scheduled:     . ipratropium  0.5 mg Nebulization Q6H   And  . albuterol  2.5 mg Nebulization Q6H  . amLODipine  5 mg Oral Daily  . carvedilol  12.5 mg Oral BID WC  . digoxin  125 mcg Oral QAC breakfast  . ferrous sulfate  325 mg Oral Q breakfast  . furosemide  40 mg Oral BID  . metFORMIN  500 mg Oral BID WC  . moxifloxacin  400 mg Oral q1800  . nitroGLYCERIN  0.2 mg Transdermal Daily  . olmesartan  20 mg Oral Daily  . potassium chloride SA  20 mEq Oral Daily  . predniSONE  20 mg Oral Q breakfast  . simvastatin  20 mg Oral QHS  . warfarin  3.75 mg Oral ONCE-1800  . DISCONTD: antiseptic oral rinse  15 mL Mouth Rinse BID  . DISCONTD: insulin aspart  1-4 Units Subcutaneous TID WC

## 2011-07-17 NOTE — Discharge Summary (Signed)
Physician Discharge Summary  Patient ID: [name] MRN: [id number] DOB/AGE: [birth year] 74 y.o.  Admit date: 07/12/2011 Discharge date: 07/17/2011   Discharge Diagnoses:  1. Acute exacerbation of COPD  2. Coronary artery disease with Ischemic Cardiomyopathy  3. Chronic anti-coagulation in setting of A-Fib  4. HTN  5. Dyslipidemia  6. Hyperglycemia in setting of steroid administration.   Admission Summary: [name] is a 76 y.o. y/o male, former smoker, with a PMH of oxygen-dependent COPD, A-Fib on coumadin, HTN/CAD, CHF who presented to Pulmonary office 07/12/11 complaining of increased dyspnea, cough with yellow sputum, orthopnea and wheezing not responsive to home nebulizer's. There was no fever, hemoptysis or chest pain. The onset of symptoms was about one week prior to presentation and he was seen in the Pulmonary office for an acute visit with complaints of productive cough with small amounts yellow mucus, wheezing, increased SOB, tightness in chest x 2 days and was treated with antibiotics and prednisone burst. He felt better for couple of days but that did not last. After evaluation in office on 1/9 he was admitted to [facility] for inpatient treatment of acute COPD exacerbation. He was placed on medical telemetry given his history of ischemic heart disease. He was placed on intravenous steroids, scheduled nebulized bronchodilators, IV antibiotics (Avelox) and aggressive pulmonary hygiene . Pharmacy was consulted to assist in management of Coumadin. No evidence of decompensated CHF noted during hospitalization. He continued to make improvements and was transitioned to PO medications prior to discharge.  He cont to have slightly elevated blood sugars on steroids, although much improved as steroids are tapered.  Will d/c on metformin and have him f/u with PCP this week to eval need for continued medication.  At time of discharge pts resp status appears back to baseline.  He will cont pred  taper and 6 days abx and f/u with Dr. Shelle Iron in pulmonary office.   Significant Diagnostic Studies:  1/9 CXR>>>no acute infiltrate, edema or pneumothorax  Most Recent Labs  BMET  Lab 07/16/11 0630 07/13/11 0535 07/12/11 2007  NA 129* 132* 133*  K 4.2 4.6 --  CL 93* 93* 94*  CO2 28 29 30   GLUCOSE 288* 231* 136*  BUN 21 21 18   CREATININE 0.75 0.83 0.74  CALCIUM 8.6 9.0 9.0  MG -- -- --  PHOS -- -- --     CBC  Lab 07/13/11 0535 07/12/11 2007  HGB 11.0* 11.4*  HCT 34.0* 34.3*  WBC 6.7 7.8  PLT 220 239   Anti-Coagulation  Lab 07/17/11 0540 07/16/11 0630 07/15/11 0500 07/14/11 0605 07/13/11 0535  INR 2.46* 2.57* 3.72* 3.31* 2.87*      Discharge Orders    Future Appointments: Provider: Department: Dept Phone: Center:   07/28/2011 10:15 AM Barbaraann Share, MD [facility] [phone] None   08/04/2011 1:30 PM Barbaraann Share, MD [facility] [phone] None   08/11/2011 9:00 AM [facility] [phone] [facility]   09/07/2011 9:15 AM Amber Caryl Bis, RN [facility] [phone] [facility]   05/08/2012 3:00 PM [facility] [phone] [facility]        Robert, Bradley  Home Medication Instructions [id number]   Printed on:07/17/11 0943  Medication Information                    albuterol (PROVENTIL) (2.5 MG/3ML) 0.083% nebulizer solution Take 2.5 mg by nebulization 4 (four) times daily.  candesartan (ATACAND) 32 MG tablet Take 32 mg by mouth daily.             carvedilol (COREG) 12.5 MG tablet Take 12.5 mg by mouth 2 (two) times daily with a meal.             digoxin (LANOXIN) 0.125 MG tablet Take 125 mcg by mouth daily.             HYDROcodone-acetaminophen (VICODIN) 2.5-500 MG per tablet Take 1 tablet by mouth every 6 (six) hours as needed. Back pain           guaiFENesin (MUCINEX) 600 MG 12 hr tablet Take 600 mg by mouth 2 (two) times daily as needed. congestion           nitroGLYCERIN (NITROSTAT) 0.4 MG SL  tablet Place 0.4 mg under the tongue every 5 (five) minutes as needed. May repeat x3            amLODipine (NORVASC) 5 MG tablet Take 5 mg by mouth daily.             potassium chloride SA (K-DUR,KLOR-CON) 20 MEQ tablet Take 20 mEq by mouth daily.             budesonide (PULMICORT) 0.5 MG/2ML nebulizer solution Take 0.5 mg by nebulization 2 (two) times daily.            simvastatin (ZOCOR) 20 MG tablet Take 20 mg by mouth at bedtime.             tiotropium (SPIRIVA) 18 MCG inhalation capsule Place 18 mcg into inhaler and inhale daily.             nitroGLYCERIN (NITRODUR - DOSED IN MG/24 HR) 0.2 mg/hr Place 1 patch onto the skin daily.            furosemide (LASIX) 40 MG tablet Take 2 tabs by mouth in the morning and 1 tab in the evening           predniSONE (DELTASONE) 10 MG tablet TAKE ONE TABLET BY MOUTH ONE TIME DAILY           ferrous sulfate 325 (65 FE) MG tablet Take 325 mg by mouth daily with breakfast.           albuterol (PROVENTIL HFA;VENTOLIN HFA) 108 (90 BASE) MCG/ACT inhaler Inhale 1-2 puffs into the lungs every 6 (six) hours as needed. Shortness of breath           warfarin (COUMADIN) 2.5 MG tablet Take 2.5 mg by mouth See admin instructions. Takes 1 1/2 tablets (3.75 mg) daily EXCEPT for 1 tablet (2.5 mg) on Fridays only.           moxifloxacin (AVELOX) 400 MG tablet Take 1 tablet (400 mg total) by mouth daily at 6 PM.           predniSONE (DELTASONE) 20 MG tablet Take 1 tablet (20 mg total) by mouth daily with breakfast.           metFORMIN (GLUCOPHAGE) 500 MG tablet Take 1 tablet (500 mg total) by mouth 2 (two) times daily with a meal.               Follow-up Information    Follow up with Barbaraann Share, MD on 07/28/2011. (Appt at 1015 am)    Contact information:   [address] [employer] [place] [address] [phone]       Follow up  with Darnelle Bos, MD on 07/20/2011. (930 am - with Chales Abrahams  Nurse Pracitioner)    Contact information:   [address] [place], [address] [employer], [place]. [facility] [place] [address] [phone]           Disposition: Home or Self Care  Discharged Condition: [name] has met maximum benefit of inpatient care and is medically stable and cleared for discharge.  Patient is pending follow up as above.      Time spent on disposition:  Greater than 35 minutes.   Danford Bad, NP 07/17/2011  10:04 AM  *Care during the described time interval was provided by me and/or other providers on the critical care team. I have reviewed this patient's available data, including medical history, events of note, physical examination and test results as part of my evaluation.

## 2011-07-28 ENCOUNTER — Encounter: Payer: Self-pay | Admitting: Pulmonary Disease

## 2011-07-28 ENCOUNTER — Ambulatory Visit (INDEPENDENT_AMBULATORY_CARE_PROVIDER_SITE_OTHER): Payer: Medicare Other | Admitting: Pulmonary Disease

## 2011-07-28 DIAGNOSIS — J449 Chronic obstructive pulmonary disease, unspecified: Secondary | ICD-10-CM

## 2011-07-28 DIAGNOSIS — E119 Type 2 diabetes mellitus without complications: Secondary | ICD-10-CM

## 2011-07-28 NOTE — Assessment & Plan Note (Signed)
The patient is nearly back to his usual baseline after a recent hospitalization for COPD exacerbation.  He has no wheezing on exam, and does not have any significant cough or mucus production.  I have asked him to continue on his current medications, and try and increase his activity to improve conditioning.  He will followup with me in 3 months or sooner if having issues.

## 2011-07-28 NOTE — Patient Instructions (Signed)
No change in pulmonary meds Will call in a 2 week supply of metformin, but you need to contact primary md next week to get full supply.  Stay as active as possible. followup with me in 3mos.

## 2011-07-28 NOTE — Progress Notes (Signed)
  Subjective:    Patient ID: [name], male    DOB: Nov 14, 1935, 76 y.o.   MRN: [id number]  HPI The patient comes in today after a recent hospitalization for an acute COPD exacerbation.  He was treated with IV steroids, antibiotics, as well as aggressive bronchodilators.  He had significant improvement, and was discharged home.  He comes in today where he is much improved.  He feels that he is back to his usual baseline, and is starting to become more active.  He denies any significant cough or purulent mucus production.  He did have some steroid-induced hyperglycemia while in the hospital, the question is raised whether he may have diabetes.  His primary physician has continued him on metformin.   Review of Systems  Constitutional: Negative for fever and unexpected weight change.  HENT: Positive for postnasal drip. Negative for ear pain, nosebleeds, congestion, sore throat, rhinorrhea, sneezing, trouble swallowing, dental problem and sinus pressure.   Eyes: Negative for redness and itching.  Respiratory: Positive for cough. Negative for chest tightness, shortness of breath and wheezing.   Cardiovascular: Positive for leg swelling. Negative for palpitations.  Gastrointestinal: Negative for nausea and vomiting.  Genitourinary: Negative for dysuria.  Musculoskeletal: Negative for joint swelling.  Skin: Negative for rash.  Neurological: Negative for headaches.  Hematological: Bruises/bleeds easily.  Psychiatric/Behavioral: Negative for dysphoric mood. The patient is not nervous/anxious.        Objective:   Physical Exam Well-developed male in no acute distress Nose without purulence or discharge noted Chest totally clear to auscultation, no wheezes or crackles Cardiac exam with slight irregularity, 1/6 systolic murmur Lower extremities with minimal ankle edema, no cyanosis Alert and oriented, moves all 4 extremities.       Assessment & Plan:

## 2011-08-04 ENCOUNTER — Ambulatory Visit: Payer: Medicare Other | Admitting: Pulmonary Disease

## 2011-08-11 ENCOUNTER — Other Ambulatory Visit (INDEPENDENT_AMBULATORY_CARE_PROVIDER_SITE_OTHER): Payer: Medicare Other | Admitting: *Deleted

## 2011-08-11 DIAGNOSIS — I70219 Atherosclerosis of native arteries of extremities with intermittent claudication, unspecified extremity: Secondary | ICD-10-CM

## 2011-08-11 DIAGNOSIS — Z48812 Encounter for surgical aftercare following surgery on the circulatory system: Secondary | ICD-10-CM

## 2011-08-28 ENCOUNTER — Other Ambulatory Visit: Payer: Self-pay | Admitting: *Deleted

## 2011-08-28 DIAGNOSIS — I70219 Atherosclerosis of native arteries of extremities with intermittent claudication, unspecified extremity: Secondary | ICD-10-CM

## 2011-08-28 DIAGNOSIS — Z48812 Encounter for surgical aftercare following surgery on the circulatory system: Secondary | ICD-10-CM

## 2011-08-29 ENCOUNTER — Encounter: Payer: Self-pay | Admitting: Surgery

## 2011-08-29 NOTE — Procedures (Unsigned)
AORTA-ILIAC DUPLEX EVALUATION  INDICATION:  Bilateral iliac stents  HISTORY: Diabetes:  No Cardiac:  No Hypertension:  Yes Smoking:  Previous Previous Surgery:  Left common iliac artery stent on 04/16/2008, left common iliac and right external iliac artery stents on 08/10/2010              SINGLE LEVEL ARTERIAL EXAM                             RIGHT                  LEFT Brachial: Anterior tibial: Posterior tibial: Peroneal: Ankle/brachial index: Previous ABI/date:  AORTA-ILIAC DUPLEX EXAM Aorta - Proximal     67 cm/s Aorta - Mid          81 cm/s Aorta - Distal       Not visualized  RIGHT                                   LEFT Not visualized    CIA-PROXIMAL          Not visualized 134 cm/s          CIA-DISTAL            108 cm/s Not visualized    HYPOGASTRIC           Not visualized 156 cm/s          EIA-PROXIMAL          58 cm/s 193 cm/s          EIA-MID               124 cm/s 128 cm/s          EIA-DISTAL            150 cm/s  IMPRESSION: 1. Unable to adequately visualize the distal abdominal aorta and     bilateral proximal common iliac arteries due to overlying bowel gas     patterns and patient body habitus. 2. Biphasic right iliac and monophasic left iliac artery waveforms     noted which suggests common iliac artery stent patency, however,     possible stenoses cannot be determined. 3. No significant change in Doppler velocities noted when compared to     the previous exam on 05/01/2011. 4. Bilateral ankle brachial indices are noted on a separate report.  ___________________________________________ V. Charlena Cross, MD  CH/MEDQ  D:  08/11/2011  T:  08/11/2011  Job:  [id number]

## 2011-09-07 ENCOUNTER — Other Ambulatory Visit: Payer: Self-pay | Admitting: Pulmonary Disease

## 2011-09-07 ENCOUNTER — Encounter: Payer: Self-pay | Admitting: Internal Medicine

## 2011-09-07 ENCOUNTER — Ambulatory Visit (INDEPENDENT_AMBULATORY_CARE_PROVIDER_SITE_OTHER): Payer: Medicare Other | Admitting: *Deleted

## 2011-09-07 DIAGNOSIS — I472 Ventricular tachycardia: Secondary | ICD-10-CM

## 2011-09-07 DIAGNOSIS — I4891 Unspecified atrial fibrillation: Secondary | ICD-10-CM

## 2011-09-07 DIAGNOSIS — I509 Heart failure, unspecified: Secondary | ICD-10-CM

## 2011-09-08 LAB — REMOTE ICD DEVICE
AL IMPEDENCE ICD: 627 Ohm
BATTERY VOLTAGE: 2.9284 V
BRDY-0002LV: 60 {beats}/min
BRDY-0003LV: 115 {beats}/min
BRDY-0004LV: 110 {beats}/min
LV LEAD THRESHOLD: 1 V
PACEART VT: 0
TOT-0002: 1
TZAT-0001ATACH: 3
TZAT-0001FASTVT: 1
TZAT-0002ATACH: NEGATIVE
TZAT-0011SLOWVT: 10 ms
TZAT-0011SLOWVT: 10 ms
TZAT-0012ATACH: 150 ms
TZAT-0012ATACH: 150 ms
TZAT-0012SLOWVT: 200 ms
TZAT-0012SLOWVT: 200 ms
TZAT-0013FASTVT: 3
TZAT-0013SLOWVT: 2
TZAT-0018FASTVT: NEGATIVE
TZAT-0018SLOWVT: NEGATIVE
TZAT-0018SLOWVT: NEGATIVE
TZAT-0019ATACH: 6 V
TZAT-0019ATACH: 6 V
TZAT-0019FASTVT: 8 V
TZAT-0019SLOWVT: 8 V
TZAT-0019SLOWVT: 8 V
TZAT-0020ATACH: 1.5 ms
TZAT-0020ATACH: 1.5 ms
TZAT-0020ATACH: 1.5 ms
TZAT-0020FASTVT: 1.5 ms
TZAT-0020SLOWVT: 1.5 ms
TZAT-0020SLOWVT: 1.5 ms
TZON-0003FASTVT: 300 ms
TZON-0003SLOWVT: 430 ms
TZON-0003VSLOWVT: 460 ms
TZON-0005SLOWVT: 12
TZST-0001ATACH: 4
TZST-0001ATACH: 5
TZST-0001ATACH: 6
TZST-0001FASTVT: 2
TZST-0001FASTVT: 4
TZST-0001SLOWVT: 4
TZST-0001SLOWVT: 5
TZST-0002ATACH: NEGATIVE
TZST-0003FASTVT: 35 J
TZST-0003FASTVT: 35 J
TZST-0003FASTVT: 35 J
TZST-0003SLOWVT: 30 J
TZST-0003SLOWVT: 35 J
TZST-0003SLOWVT: 35 J
VENTRICULAR PACING ICD: 98.8 pct

## 2011-09-13 ENCOUNTER — Other Ambulatory Visit: Payer: Self-pay | Admitting: Pulmonary Disease

## 2011-09-13 ENCOUNTER — Telehealth: Payer: Self-pay | Admitting: Pulmonary Disease

## 2011-09-13 MED ORDER — PREDNISONE 10 MG PO TABS: 10.0000 mg | ORAL_TABLET | Freq: Every day | ORAL | Status: DC

## 2011-09-13 NOTE — Progress Notes (Signed)
Remote icd check w/icm  

## 2011-09-13 NOTE — Telephone Encounter (Signed)
Contacted patient, sent in refill for prednisone and pt aware

## 2011-09-19 ENCOUNTER — Encounter: Payer: Self-pay | Admitting: *Deleted

## 2011-09-19 ENCOUNTER — Telehealth: Payer: Self-pay | Admitting: Pulmonary Disease

## 2011-09-19 MED ORDER — POTASSIUM CHLORIDE CRYS ER 20 MEQ PO TBCR: 20.0000 meq | EXTENDED_RELEASE_TABLET | Freq: Every day | ORAL | Status: DC

## 2011-09-19 MED ORDER — ALBUTEROL SULFATE HFA 108 (90 BASE) MCG/ACT IN AERS: 1.0000 | INHALATION_SPRAY | Freq: Four times a day (QID) | RESPIRATORY_TRACT | Status: DC | PRN

## 2011-09-19 NOTE — Telephone Encounter (Signed)
Received refill request for Proventil HFA and Klor Con.  Called and informed him both meds refilled for 90 day supply x 3 refills and [facility]. Pt verbalized understanding and nothing further needed.

## 2011-10-27 ENCOUNTER — Encounter: Payer: Self-pay | Admitting: Pulmonary Disease

## 2011-10-27 ENCOUNTER — Ambulatory Visit (INDEPENDENT_AMBULATORY_CARE_PROVIDER_SITE_OTHER): Payer: Medicare Other | Admitting: Pulmonary Disease

## 2011-10-27 VITALS — BP 148/68 | HR 83 | Temp 97.8°F | Ht 67.0 in | Wt 180.0 lb

## 2011-10-27 DIAGNOSIS — J449 Chronic obstructive pulmonary disease, unspecified: Secondary | ICD-10-CM

## 2011-10-27 MED ORDER — ACAPELLA MISC: Status: DC

## 2011-10-27 NOTE — Assessment & Plan Note (Signed)
The pt is doing well from a pulmonary standpoint.  He has not had a significant change in exercise tolerance, and no significant pulmonary congestion currently.  He would like to try a flutter valve to see if it will help with [facility].  I have asked him to stay on current meds, and to stay active.

## 2011-10-27 NOTE — Progress Notes (Signed)
  Subjective:    Patient ID: [name], male    DOB: 11-05-1935, 76 y.o.   MRN: [id number]  HPI Patient comes in today for followup of his known steroid dependent emphysema.  He also has a known cardiomyopathy.  Overall, he has been doing fairly well, and his exertional tolerance is at his usual baseline.  He has intermittent chest congestion, with occasional difficulty with mucociliary clearance.  He would like to try a flutter valve.   Review of Systems  Constitutional: Negative for fever and unexpected weight change.  HENT: Negative for ear pain, nosebleeds, congestion, sore throat, rhinorrhea, sneezing, trouble swallowing, dental problem, postnasal drip and sinus pressure.   Eyes: Negative for redness and itching.  Respiratory: Positive for cough and shortness of breath. Negative for chest tightness and wheezing.   Cardiovascular: Negative for palpitations and leg swelling.  Gastrointestinal: Negative for nausea and vomiting.  Genitourinary: Negative for dysuria.  Musculoskeletal: Negative for joint swelling.  Skin: Negative for rash.  Neurological: Negative for headaches.  Hematological: Does not bruise/bleed easily.  Psychiatric/Behavioral: Negative for dysphoric mood. The patient is not nervous/anxious.        Objective:   Physical Exam Well-developed male in no acute distress Nose without purulence or discharge noted Chest with decreased breath sounds, few basilar crackles, no wheezes Cardiac exam with regular rate and rhythm Lower extremities with minimal ankle edema, no cyanosis Alert and oriented, moves all 4 extremities.       Assessment & Plan:

## 2011-10-27 NOTE — Patient Instructions (Signed)
No change in current medications Can try flutter valve once or twice a day depending upon level of congestion. If doing well, followup with me in 3mos.

## 2011-11-21 ENCOUNTER — Telehealth: Payer: Self-pay | Admitting: Pulmonary Disease

## 2011-11-23 MED ORDER — FUROSEMIDE 40 MG PO TABS: ORAL_TABLET | ORAL | Status: DC

## 2011-11-23 NOTE — Telephone Encounter (Signed)
KC has signed the form and this was faxed back to [facility]. Copy scanned to pt's chart.

## 2011-12-05 ENCOUNTER — Ambulatory Visit (INDEPENDENT_AMBULATORY_CARE_PROVIDER_SITE_OTHER): Payer: Medicare Other | Admitting: Internal Medicine

## 2011-12-05 ENCOUNTER — Encounter: Payer: Self-pay | Admitting: Internal Medicine

## 2011-12-05 VITALS — BP 143/63 | HR 83 | Ht 67.0 in | Wt 178.0 lb

## 2011-12-05 DIAGNOSIS — I5022 Chronic systolic (congestive) heart failure: Secondary | ICD-10-CM

## 2011-12-05 DIAGNOSIS — I4891 Unspecified atrial fibrillation: Secondary | ICD-10-CM

## 2011-12-05 DIAGNOSIS — I5023 Acute on chronic systolic (congestive) heart failure: Secondary | ICD-10-CM | POA: Insufficient documentation

## 2011-12-05 DIAGNOSIS — I472 Ventricular tachycardia: Secondary | ICD-10-CM

## 2011-12-05 DIAGNOSIS — Z95810 Presence of automatic (implantable) cardiac defibrillator: Secondary | ICD-10-CM

## 2011-12-05 NOTE — Progress Notes (Signed)
HPI [name] returns today for followup. He is a pleasant 76 yo man with an ICM, chronic systolic CHF, and VT. He is s/p BiV ICD implant. He has chronic systolic CHF and PAF. He notes dyspnea with exertion which is class 2. No syncope or recent ICD shocks. No Known Allergies   Current Outpatient Prescriptions  Medication Sig Dispense Refill  . albuterol (PROVENTIL HFA;VENTOLIN HFA) 108 (90 BASE) MCG/ACT inhaler Inhale 1-2 puffs into the lungs every 6 (six) hours as needed. Shortness of breath  3 Inhaler  3  . albuterol (PROVENTIL) (2.5 MG/3ML) 0.083% nebulizer solution Take 2.5 mg by nebulization 4 (four) times daily.       Marland Kitchen amLODipine (NORVASC) 5 MG tablet Take 5 mg by mouth daily.        . budesonide (PULMICORT) 0.5 MG/2ML nebulizer solution Take 0.5 mg by nebulization 2 (two) times daily.       . candesartan (ATACAND) 32 MG tablet Take 32 mg by mouth daily.        . carvedilol (COREG) 12.5 MG tablet Take 12.5 mg by mouth 2 (two) times daily with a meal.        . digoxin (LANOXIN) 0.125 MG tablet Take 125 mcg by mouth daily.        . ferrous sulfate 325 (65 FE) MG tablet Take 325 mg by mouth daily with breakfast.      . furosemide (LASIX) 40 MG tablet Take 2 tabs by mouth in the morning and 1 tab in the evening  270 tablet  3  . guaiFENesin (MUCINEX) 600 MG 12 hr tablet Take 600 mg by mouth 2 (two) times daily as needed. congestion      . HYDROcodone-acetaminophen (VICODIN) 2.5-500 MG per tablet Take 1 tablet by mouth every 6 (six) hours as needed. Back pain      . metFORMIN (GLUCOPHAGE) 500 MG tablet Take 1 tablet (500 mg total) by mouth 2 (two) times daily with a meal.  20 tablet  0  . Misc. Devices (ACAPELLA) MISC Use flutter valve as directed  1 each  0  . nitroGLYCERIN (NITRODUR - DOSED IN MG/24 HR) 0.2 mg/hr Place 1 patch onto the skin daily.       . nitroGLYCERIN (NITROSTAT) 0.4 MG SL tablet Place 0.4 mg under the tongue every 5 (five) minutes as needed. May repeat x3       .  potassium chloride SA (K-DUR,KLOR-CON) 20 MEQ tablet Take 1 tablet (20 mEq total) by mouth daily.  90 tablet  3  . predniSONE (DELTASONE) 10 MG tablet Take 1 tablet (10 mg total) by mouth daily.  30 tablet  4  . simvastatin (ZOCOR) 20 MG tablet Take 20 mg by mouth at bedtime.        Marland Kitchen tiotropium (SPIRIVA) 18 MCG inhalation capsule Place 18 mcg into inhaler and inhale daily.        Marland Kitchen warfarin (COUMADIN) 2.5 MG tablet Take 2.5 mg by mouth See admin instructions.          Past Medical History  Diagnosis Date  . Hypokalemia   . Colon cancer   . PVD (peripheral vascular disease)   . HTN (hypertension)   . Automatic implantable cardiac defibrillator in situ   . CHF (congestive heart failure)   . Ventricular tachycardia   . Ischemic heart disease, chronic   . Dyslipidemia   . CAD (coronary artery disease)   . COPD (chronic obstructive pulmonary disease)   . Emphysema   .  On home oxygen therapy   . Difficult intubation     ROS:   All systems reviewed and negative except as noted in the HPI.   Past Surgical History  Procedure Date  . Lumbar back surgery     x2  . Cholecystectomy   . Iliac artery stent     left  . Implantation of medtronic concerto bi-v icd 04-10-08    [name] MD  . Colonoscopy   . Polypectomy      Family History  Problem Relation Age of Onset  . Heart failure Father      History   Social History  . Marital Status: Married    Spouse Name: N/A    Number of Children: N/A  . Years of Education: N/A   Occupational History  . Not on file.   Social History Main Topics  . Smoking status: Former Smoker -- 3.0 packs/day for 48 years    Types: Cigarettes    Quit date: 07/03/2002  . Smokeless tobacco: Former Neurosurgeon    Quit date: 07/11/2002  . Alcohol Use: No  . Drug Use: No  . Sexually Active: Not on file   Other Topics Concern  . Not on file   Social History Narrative  . No narrative on file     BP 143/63  Pulse 83  Ht 5\' 7"  (1.702 m)   Wt 178 lb (80.74 kg)  BMI 27.88 kg/m2  Physical Exam:  Well appearing 76 yo man, NAD HEENT: Unremarkable Neck:  7 cm JVD, no thyromegally, Lungs:  Clear with no wheezes. Scattered rales in the bases bilaterally. Well-healed ICD incision. HEART:  Regular rate rhythm, no murmurs, no rubs, no clicks Abd:  soft, positive bowel sounds, no organomegally, no rebound, no guarding Ext:  2 plus pulses, no edema, no cyanosis, no clubbing Skin:  No rashes no nodules Neuro:  CN II through XII intact, motor grossly intact  DEVICE  Normal device function.  See PaceArt for details.   Assess/Plan:

## 2011-12-05 NOTE — Assessment & Plan Note (Signed)
He has no sustained ventricular arrhythmias. We'll continue his current medical therapy.

## 2011-12-05 NOTE — Assessment & Plan Note (Signed)
His device is working normally. We'll plan to recheck in several months. 

## 2011-12-05 NOTE — Patient Instructions (Signed)
Your physician wants you to follow-up in: 12 months with Dr Taylor You will receive a reminder letter in the mail two months in advance. If you don't receive a letter, please call our office to schedule the follow-up appointment.   Remote monitoring is used to monitor your Pacemaker of ICD from home. This monitoring reduces the number of office visits required to check your device to one time per year. It allows us to keep an eye on the functioning of your device to ensure it is working properly. You are scheduled for a device check from home on 03/14/12. You may send your transmission at any time that day. If you have a wireless device, the transmission will be sent automatically. After your physician reviews your transmission, you will receive a postcard with your next transmission date.   

## 2011-12-05 NOTE — Assessment & Plan Note (Signed)
His symptoms are class II. He is BiV pacing 95% of the time. We discussed the importance of a low-sodium diet and that he continue to exercise and stay as active as possible and that he take his medications as directed. I considered uptitrate his medications today but will hold off on this for now.

## 2011-12-05 NOTE — Assessment & Plan Note (Signed)
Device interrogation demonstrates that he is maintaining sinus rhythm approximately 90% of the time. He will continue his current medical therapy.

## 2011-12-06 LAB — ICD DEVICE OBSERVATION
AL IMPEDENCE ICD: 627 Ohm
LV LEAD IMPEDENCE ICD: 551 Ohm
LV LEAD THRESHOLD: 1 V
RV LEAD IMPEDENCE ICD: 475 Ohm
RV LEAD THRESHOLD: 0.75 V
TZAT-0001ATACH: 1
TZAT-0001ATACH: 2
TZAT-0001ATACH: 3
TZAT-0004SLOWVT: 8
TZAT-0004SLOWVT: 8
TZAT-0005FASTVT: 88 pct
TZAT-0005SLOWVT: 88 pct
TZAT-0005SLOWVT: 91 pct
TZAT-0011FASTVT: 10 ms
TZAT-0012ATACH: 150 ms
TZAT-0012ATACH: 150 ms
TZAT-0012FASTVT: 200 ms
TZAT-0012SLOWVT: 200 ms
TZAT-0012SLOWVT: 200 ms
TZAT-0013FASTVT: 3
TZAT-0013SLOWVT: 2
TZAT-0013SLOWVT: 3
TZAT-0018ATACH: NEGATIVE
TZAT-0018ATACH: NEGATIVE
TZAT-0018FASTVT: NEGATIVE
TZAT-0020ATACH: 1.5 ms
TZAT-0020SLOWVT: 1.5 ms
TZON-0003ATACH: 350 ms
TZON-0003FASTVT: 300 ms
TZON-0003SLOWVT: 430 ms
TZON-0004SLOWVT: 36
TZON-0004VSLOWVT: 40
TZST-0001ATACH: 4
TZST-0001ATACH: 6
TZST-0001FASTVT: 3
TZST-0001FASTVT: 5
TZST-0001SLOWVT: 4
TZST-0001SLOWVT: 6
TZST-0002ATACH: NEGATIVE
TZST-0003FASTVT: 35 J
TZST-0003FASTVT: 35 J
TZST-0003FASTVT: 35 J
TZST-0003SLOWVT: 35 J
TZST-0003SLOWVT: 35 J

## 2011-12-16 ENCOUNTER — Inpatient Hospital Stay (HOSPITAL_COMMUNITY)
Admission: EM | Admit: 2011-12-16 | Discharge: 2011-12-19 | DRG: 086 | Disposition: A | Discharge status: Home / Self Care | Payer: Medicare Other | Source: Ambulatory Visit | Attending: Internal Medicine | Admitting: Internal Medicine

## 2011-12-16 ENCOUNTER — Emergency Department (HOSPITAL_COMMUNITY): Payer: Medicare Other

## 2011-12-16 ENCOUNTER — Encounter (HOSPITAL_COMMUNITY): Payer: Self-pay | Admitting: Family Medicine

## 2011-12-16 DIAGNOSIS — E785 Hyperlipidemia, unspecified: Secondary | ICD-10-CM | POA: Diagnosis present

## 2011-12-16 DIAGNOSIS — I10 Essential (primary) hypertension: Secondary | ICD-10-CM | POA: Diagnosis present

## 2011-12-16 DIAGNOSIS — I6529 Occlusion and stenosis of unspecified carotid artery: Secondary | ICD-10-CM | POA: Diagnosis present

## 2011-12-16 DIAGNOSIS — Z87891 Personal history of nicotine dependence: Secondary | ICD-10-CM

## 2011-12-16 DIAGNOSIS — I739 Peripheral vascular disease, unspecified: Secondary | ICD-10-CM | POA: Diagnosis present

## 2011-12-16 DIAGNOSIS — I5022 Chronic systolic (congestive) heart failure: Secondary | ICD-10-CM | POA: Diagnosis present

## 2011-12-16 DIAGNOSIS — I2510 Atherosclerotic heart disease of native coronary artery without angina pectoris: Secondary | ICD-10-CM | POA: Diagnosis present

## 2011-12-16 DIAGNOSIS — Z9981 Dependence on supplemental oxygen: Secondary | ICD-10-CM

## 2011-12-16 DIAGNOSIS — G459 Transient cerebral ischemic attack, unspecified: Secondary | ICD-10-CM | POA: Diagnosis present

## 2011-12-16 DIAGNOSIS — E876 Hypokalemia: Secondary | ICD-10-CM | POA: Diagnosis present

## 2011-12-16 DIAGNOSIS — Z7901 Long term (current) use of anticoagulants: Secondary | ICD-10-CM

## 2011-12-16 DIAGNOSIS — W19XXXA Unspecified fall, initial encounter: Secondary | ICD-10-CM | POA: Diagnosis present

## 2011-12-16 DIAGNOSIS — J438 Other emphysema: Secondary | ICD-10-CM | POA: Diagnosis present

## 2011-12-16 DIAGNOSIS — E119 Type 2 diabetes mellitus without complications: Secondary | ICD-10-CM | POA: Diagnosis present

## 2011-12-16 DIAGNOSIS — Z95810 Presence of automatic (implantable) cardiac defibrillator: Secondary | ICD-10-CM

## 2011-12-16 DIAGNOSIS — I4891 Unspecified atrial fibrillation: Secondary | ICD-10-CM | POA: Diagnosis present

## 2011-12-16 DIAGNOSIS — C189 Malignant neoplasm of colon, unspecified: Secondary | ICD-10-CM | POA: Diagnosis present

## 2011-12-16 DIAGNOSIS — I658 Occlusion and stenosis of other precerebral arteries: Secondary | ICD-10-CM | POA: Diagnosis present

## 2011-12-16 DIAGNOSIS — I2589 Other forms of chronic ischemic heart disease: Secondary | ICD-10-CM | POA: Diagnosis present

## 2011-12-16 DIAGNOSIS — R55 Syncope and collapse: Secondary | ICD-10-CM

## 2011-12-16 DIAGNOSIS — J439 Emphysema, unspecified: Secondary | ICD-10-CM | POA: Diagnosis present

## 2011-12-16 DIAGNOSIS — R531 Weakness: Secondary | ICD-10-CM

## 2011-12-16 DIAGNOSIS — S065X9A Traumatic subdural hemorrhage with loss of consciousness of unspecified duration, initial encounter: Principal | ICD-10-CM | POA: Diagnosis present

## 2011-12-16 LAB — URINALYSIS, DIPSTICK ONLY
Bilirubin Urine: NEGATIVE
Glucose, UA: NEGATIVE mg/dL
Ketones, ur: NEGATIVE mg/dL
Nitrite: NEGATIVE
Specific Gravity, Urine: 1.013 (ref 1.005–1.030)
pH: 6 (ref 5.0–8.0)

## 2011-12-16 LAB — RAPID URINE DRUG SCREEN, HOSP PERFORMED
Opiates: NOT DETECTED
Tetrahydrocannabinol: NOT DETECTED

## 2011-12-16 LAB — CBC
HCT: 32 % — ABNORMAL LOW (ref 39.0–52.0)
Hemoglobin: 10.4 g/dL — ABNORMAL LOW (ref 13.0–17.0)
RBC: 3.53 MIL/uL — ABNORMAL LOW (ref 4.22–5.81)
WBC: 16.3 10*3/uL — ABNORMAL HIGH (ref 4.0–10.5)

## 2011-12-16 LAB — COMPREHENSIVE METABOLIC PANEL
BUN: 18 mg/dL (ref 6–23)
CO2: 32 mEq/L (ref 19–32)
Calcium: 9.4 mg/dL (ref 8.4–10.5)
Creatinine, Ser: 1.03 mg/dL (ref 0.50–1.35)
GFR calc Af Amer: 81 mL/min — ABNORMAL LOW (ref >90)
GFR calc non Af Amer: 69 mL/min — ABNORMAL LOW (ref >90)
Glucose, Bld: 96 mg/dL (ref 70–99)
Sodium: 139 mEq/L (ref 135–145)
Total Protein: 7.3 g/dL (ref 6.0–8.3)

## 2011-12-16 LAB — DIFFERENTIAL
Basophils Relative: 0 % (ref 0–1)
Lymphocytes Relative: 6 % — ABNORMAL LOW (ref 12–46)
Monocytes Absolute: 1.4 10*3/uL — ABNORMAL HIGH (ref 0.1–1.0)
Monocytes Relative: 9 % (ref 3–12)
Neutro Abs: 13.7 10*3/uL — ABNORMAL HIGH (ref 1.7–7.7)
Neutrophils Relative %: 84 % — ABNORMAL HIGH (ref 43–77)

## 2011-12-16 LAB — GLUCOSE, CAPILLARY: Glucose-Capillary: 134 mg/dL — ABNORMAL HIGH (ref 70–99)

## 2011-12-16 MED ORDER — INSULIN ASPART 100 UNIT/ML ~~LOC~~ SOLN
0.0000 [IU] | Freq: Three times a day (TID) | SUBCUTANEOUS | Status: DC
  Administered 2011-12-18: 2 [IU] via SUBCUTANEOUS

## 2011-12-16 MED ORDER — ALBUTEROL SULFATE (5 MG/ML) 0.5% IN NEBU: 2.5000 mg | INHALATION_SOLUTION | RESPIRATORY_TRACT | Status: DC | PRN

## 2011-12-16 MED ORDER — ALBUTEROL SULFATE (5 MG/ML) 0.5% IN NEBU
2.5000 mg | INHALATION_SOLUTION | Freq: Four times a day (QID) | RESPIRATORY_TRACT | Status: DC
  Administered 2011-12-16 – 2011-12-19 (×11): 2.5 mg via RESPIRATORY_TRACT
  Filled 2011-12-16 (×11): qty 0.5

## 2011-12-16 MED ORDER — SODIUM CHLORIDE 0.9 % IV SOLN
INTRAVENOUS | Status: AC
  Administered 2011-12-16: 23:00:00 via INTRAVENOUS

## 2011-12-16 MED ORDER — INSULIN ASPART 100 UNIT/ML ~~LOC~~ SOLN: 0.0000 [IU] | Freq: Every day | SUBCUTANEOUS | Status: DC

## 2011-12-16 NOTE — H&P (Addendum)
DATE OF ADMISSION:  12/16/2011  PCP:    Darnelle Bos, MD Chief Complaint: Passed Out   HPI: [name] is an 76 y.o. male presenting with complaints of passing out at 3:30 pm and falling and hitting his head.  He reports when he came to he was weak and had weakness of his left side and could not get up.  He hit the right side of his head on the vanity on his fall.  He denied having any prodromal symptoms, or chest.  His Left sided weakness began to resolve 1 hour before arrival in the ED.  In the ED,  he was evaluated and a CT scan of the Brain was done which revealed a small subacute subdural hematoma, and Neurosurgery was called and evaluated him in the ED.    Past Medical History  Diagnosis Date  . Hypokalemia   . Colon cancer   . PVD (peripheral vascular disease)   . HTN (hypertension)   . Automatic implantable cardiac defibrillator in situ   . CHF (congestive heart failure)   . Ventricular tachycardia   . Ischemic heart disease, chronic   . Dyslipidemia   . CAD (coronary artery disease)   . COPD (chronic obstructive pulmonary disease)   . Emphysema   . On home oxygen therapy   . Difficult intubation     Past Surgical History  Procedure Date  . Lumbar back surgery     x2  . Cholecystectomy   . Iliac artery stent     left  . Implantation of medtronic concerto bi-v icd 04-10-08    [name] MD  . Colonoscopy   . Polypectomy     Medications:  HOME MEDS: Prior to Admission medications   Medication Sig Start Date End Date Taking? Authorizing Provider  albuterol (PROVENTIL HFA;VENTOLIN HFA) 108 (90 BASE) MCG/ACT inhaler Inhale 1-2 puffs into the lungs every 6 (six) hours as needed. Shortness of breath 09/19/11  Yes Barbaraann Share, MD  albuterol (PROVENTIL) (2.5 MG/3ML) 0.083% nebulizer solution Take 2.5 mg by nebulization 4 (four) times daily.    Yes Historical Provider, MD  amLODipine (NORVASC) 5 MG tablet Take 5 mg by mouth daily.     Yes Historical  Provider, MD  budesonide (PULMICORT) 0.5 MG/2ML nebulizer solution Take 0.5 mg by nebulization 2 (two) times daily.    Yes Historical Provider, MD  candesartan (ATACAND) 32 MG tablet Take 32 mg by mouth daily.     Yes Historical Provider, MD  carvedilol (COREG) 12.5 MG tablet Take 12.5 mg by mouth 2 (two) times daily with a meal.     Yes Historical Provider, MD  digoxin (LANOXIN) 0.125 MG tablet Take 125 mcg by mouth daily.     Yes Historical Provider, MD  ferrous sulfate 325 (65 FE) MG tablet Take 325 mg by mouth daily with breakfast.   Yes Historical Provider, MD  furosemide (LASIX) 40 MG tablet Take 2 tabs by mouth in the morning and 1 tab in the evening 11/23/11  Yes Barbaraann Share, MD  guaiFENesin (MUCINEX) 600 MG 12 hr tablet Take 600 mg by mouth 2 (two) times daily as needed. congestion   Yes Historical Provider, MD  HYDROcodone-acetaminophen (VICODIN) 2.5-500 MG per tablet Take 1 tablet by mouth every 6 (six) hours as needed. Back pain   Yes Historical Provider, MD  metFORMIN (GLUCOPHAGE) 500 MG tablet Take 1 tablet (500 mg total) by mouth 2 (two) times daily with a meal. 07/17/11 07/16/12 Yes  Bernadene Person, NP  nitroGLYCERIN (NITRODUR - DOSED IN MG/24 HR) 0.2 mg/hr Place 1 patch onto the skin daily.    Yes Historical Provider, MD  nitroGLYCERIN (NITROSTAT) 0.4 MG SL tablet Place 0.4 mg under the tongue every 5 (five) minutes as needed. May repeat x3 For chest pain   Yes Historical Provider, MD  potassium chloride SA (K-DUR,KLOR-CON) 20 MEQ tablet Take 1 tablet (20 mEq total) by mouth daily. 09/19/11  Yes Barbaraann Share, MD  predniSONE (DELTASONE) 10 MG tablet Take 1 tablet (10 mg total) by mouth daily. 09/13/11 09/12/12 Yes Barbaraann Share, MD  simvastatin (ZOCOR) 20 MG tablet Take 20 mg by mouth at bedtime.     Yes Historical Provider, MD  tiotropium (SPIRIVA) 18 MCG inhalation capsule Place 18 mcg into inhaler and inhale daily.     Yes Historical Provider, MD  warfarin (COUMADIN) 2.5 MG  tablet Take 2.5-3.75 mg by mouth daily. Takes 1.5 tablets daily except Friday then he only takes 1 tablet   Yes Historical Provider, MD  Misc. Devices (ACAPELLA) MISC Use flutter valve as directed 10/27/11   Barbaraann Share, MD    Allergies:  No Known Allergies  Social History:   reports that he quit smoking about 9 years ago. His smoking use included Cigarettes. He has a 144 pack-year smoking history. He quit smokeless tobacco use about 9 years ago. He reports that he does not drink alcohol or use illicit drugs.  Family History: Family History  Problem Relation Age of Onset  . Heart failure Father     Review of Systems:  The patient denies anorexia, fever, weight loss, vision loss, decreased hearing, hoarseness, chest pain, syncope, dyspnea on exertion, peripheral edema, balance deficits, hemoptysis, abdominal pain, melena, hematochezia, severe indigestion/heartburn, hematuria, incontinence, g enital sores, muscle weakness, suspicious skin lesions, transient blindness, difficulty walking, depression, unusual weight change, abnormal bleeding, enlarged lymph nodes, angioedema, and breast masses.  Physical Exam:  GEN: Pleasant examined  and in no acute distress; cooperative with exam Filed Vitals:   12/16/11 1556 12/16/11 1836 12/16/11 2106 12/16/11 2109  BP: 133/48 128/72  132/82  Pulse: 77 73  73  Temp: 98.1 F (36.7 C)  98.4 F (36.9 C) 98.4 F (36.9 C)  TempSrc: Oral     Resp: 18 18  22   SpO2: 99% 97%  96%   Blood pressure 132/82, pulse 73, temperature 98.4 F (36.9 C), temperature source Oral, resp. rate 22, SpO2 96.00%. PSYCH: SHe is alert and oriented x4; does not appear anxious does not appear depressed; affect is normal HEENT: Normocephalic and Atraumatic, Mucous membranes pink; PERRLA; EOM intact; Fundi:  Benign;  No scleral icterus, Nares: Patent, Oropharynx: Clear, Edentulous or Fair Dentition, Neck:  FROM, no cervical lymphadenopathy nor thyromegaly or carotid bruit; no  JVD; Breasts:: Not examined CHEST WALL: No tenderness CHEST: Normal respiration, clear to auscultation bilaterally HEART: Regular rate and rhythm; no murmurs rubs or gallops BACK: No kyphosis or scoliosis; no CVA tenderness ABDOMEN: Positive Bowel Sounds, Scaphoid, Obese, soft non-tender; no masses, no organomegaly, no pannus; no intertriginous candida. Rectal Exam: Not done EXTREMITIES: No bone or joint deformity; age-appropriate arthropathy of the hands and knees; no cyanosis, clubbing or edema; no ulcerations. Genitalia: not examined PULSES: 2+ and symmetric SKIN: Normal hydration no rash or ulceration CNS: Cranial nerves 2-12 grossly intact no focal neurologic deficit   Labs & Imaging Results for orders placed during the hospital encounter of 12/16/11 (from the past 48 hour(s))  COMPREHENSIVE METABOLIC PANEL     Status: Abnormal   Collection Time   12/16/11  4:21 PM      Component Value Range Comment   Sodium 139  135 - 145 mEq/L    Potassium 4.4  3.5 - 5.1 mEq/L    Chloride 98  96 - 112 mEq/L    CO2 32  19 - 32 mEq/L    Glucose, Bld 96  70 - 99 mg/dL    BUN 18  6 - 23 mg/dL    Creatinine, Ser 8.29  0.50 - 1.35 mg/dL    Calcium 9.4  8.4 - 56.2 mg/dL    Total Protein 7.3  6.0 - 8.3 g/dL    Albumin 3.6  3.5 - 5.2 g/dL    AST 17  0 - 37 U/L    ALT 11  0 - 53 U/L    Alkaline Phosphatase 75  39 - 117 U/L    Total Bilirubin 0.4  0.3 - 1.2 mg/dL    GFR calc non Af Amer 69 (*) >90 mL/min    GFR calc Af Amer 81 (*) >90 mL/min   CBC     Status: Abnormal   Collection Time   12/16/11  4:21 PM      Component Value Range Comment   WBC 16.3 (*) 4.0 - 10.5 K/uL    RBC 3.53 (*) 4.22 - 5.81 MIL/uL    Hemoglobin 10.4 (*) 13.0 - 17.0 g/dL    HCT 13.0 (*) 86.5 - 52.0 %    MCV 90.7  78.0 - 100.0 fL    MCH 29.5  26.0 - 34.0 pg    MCHC 32.5  30.0 - 36.0 g/dL    RDW 78.4  69.6 - 29.5 %    Platelets 213  150 - 400 K/uL   DIFFERENTIAL     Status: Abnormal   Collection Time   12/16/11  4:21  PM      Component Value Range Comment   Neutrophils Relative 84 (*) 43 - 77 %    Neutro Abs 13.7 (*) 1.7 - 7.7 K/uL    Lymphocytes Relative 6 (*) 12 - 46 %    Lymphs Abs 1.0  0.7 - 4.0 K/uL    Monocytes Relative 9  3 - 12 %    Monocytes Absolute 1.4 (*) 0.1 - 1.0 K/uL    Eosinophils Relative 1  0 - 5 %    Eosinophils Absolute 0.1  0.0 - 0.7 K/uL    Basophils Relative 0  0 - 1 %    Basophils Absolute 0.0  0.0 - 0.1 K/uL   TROPONIN I     Status: Normal   Collection Time   12/16/11  4:21 PM      Component Value Range Comment   Troponin I <0.30  <0.30 ng/mL   URINALYSIS, DIPSTICK ONLY     Status: Abnormal   Collection Time   12/16/11  5:34 PM      Component Value Range Comment   Specific Gravity, Urine 1.013  1.005 - 1.030    pH 6.0  5.0 - 8.0    Glucose, UA NEGATIVE  NEGATIVE mg/dL    Hgb urine dipstick NEGATIVE  NEGATIVE    Bilirubin Urine NEGATIVE  NEGATIVE    Ketones, ur NEGATIVE  NEGATIVE mg/dL    Protein, ur 30 (*) NEGATIVE mg/dL    Urobilinogen, UA 0.2  0.0 - 1.0 mg/dL    Nitrite NEGATIVE  NEGATIVE  Leukocytes, UA TRACE (*) NEGATIVE    Ct Head Wo Contrast  12/16/2011  *RADIOLOGY REPORT*  Clinical Data: Left-sided weakness.  Facial droop for 2 days. Gradual worsening.  Syncopal episode today.  CT HEAD WITHOUT CONTRAST  Technique:  Contiguous axial images were obtained from the base of the skull through the vertex without contrast.  Comparison: 01/23/2011.  Findings: Right frontal subdural fluid collection is present which is new compared to the prior exam of 01/23/2011.  There is a tiny amount of high attenuation blood along the inner table of the right frontal bone (image 11 series 2).  Maximal thickness of the subdural fluid collection is 12 mm.  No midline shift.  The focal chronic ischemia is present adjacent to the frontal horn of the right lateral ventricle.  Atrophy and chronic ischemic white matter disease appears similar to prior.  Left sub insular lacunar infarct.   Intracranial atherosclerosis.  Lacunar infarct in the left pons again noted.  Mastoid air cells clear.  Chronic paranasal sinus disease is unchanged.  IMPRESSION: 1.  Mixed attenuation right subdural collection, with small amount of acute or subacute hemorrhage. Findings compatible with acute on chronic subdural hematoma or small subdural hematoma superimposed on subdural hygroma.  Mild mass effect without midline shift. Maximal thickness 12 mm. 2.  Underlying atrophy and chronic ischemic white matter disease without interval change. 3.  Chronic paranasal sinus disease.  Critical Value/emergent results were called by telephone at the time of interpretation on 12/16/2011  at 1703 hours  to  Dahlia Byes, RN, who verbally acknowledged these results.  Original Report Authenticated By: Andreas Newport, M.D.   Dg Chest Portable 1 View  12/16/2011  *RADIOLOGY REPORT*  Clinical Data: Loss of consciousness.  Extremity weakness. Syncope.  PORTABLE CHEST - 1 VIEW  Comparison: 07/12/2011.  Findings: Cardiomegaly.  Median sternotomy.  Cardiac pacemaker / AICD with multiple leads.  No airspace disease.  No effusion. Patient is rotated to the left.  IMPRESSION: Cardiomegaly without failure. Postoperative and postprocedural changes of the chest without acute cardiopulmonary disease.  Original Report Authenticated By: Andreas Newport, M.D.      Assessment:  1. Syncope 2. TIA 3. SAH 4. CAD 5. COPD 6. Chronic Systolic CHF 7. HTN   Plan:    Admit to Telemtry Bed TIA and Syncope Workup Neuro Checks Seen by Neuro Surgery in ED for [facility], continue to monitor, Repeat CT scan of Brain in AM SCDs for DVT prophylaxis Other plans as per orders.    CODE STATUS:      FULL CODE          Shanaya Schneck C 12/16/2011, 9:10 PM

## 2011-12-16 NOTE — Consult Note (Signed)
Reason for Consult: Closed head injury subdural hematoma Referring Physician: ER Dr. Benard Halsted is an 76 y.o. male.  HPI: Patient is 76 years and has a long accommodative past medical history however recently has had some episodes of falls and intermittent weakness that he says involves both his hands I feels occasionally his face trying up as well as both ends drawing up as well as weakness in his left hand and leg. He has chronic difficulties with his left leg with weakness in his foot that was a sequela of back surgery 4 years ago and also left in the incontinent of bowel bladder. The episodes exam of his arms have been something more recently to him although the history was very difficult to follow as the patient seemed to bounce back and forth amongst different thoughts. By he does appear to have involvement of his right hand as well as his left hand and arm. He has a mild headache but the headache is worse on the left side. He denies any numbness in his face he says is feelingdrawing upon. He denies any nausea or vomiting.  Past Medical History  Diagnosis Date  . Hypokalemia   . Colon cancer   . PVD (peripheral vascular disease)   . HTN (hypertension)   . Automatic implantable cardiac defibrillator in situ   . CHF (congestive heart failure)   . Ventricular tachycardia   . Ischemic heart disease, chronic   . Dyslipidemia   . CAD (coronary artery disease)   . COPD (chronic obstructive pulmonary disease)   . Emphysema   . On home oxygen therapy   . Difficult intubation     Past Surgical History  Procedure Date  . Lumbar back surgery     x2  . Cholecystectomy   . Iliac artery stent     left  . Implantation of medtronic concerto bi-v icd 04-10-08    [name] MD  . Colonoscopy   . Polypectomy     Family History  Problem Relation Age of Onset  . Heart failure Father     Social History:  reports that he quit smoking about 9 years ago. His smoking use  included Cigarettes. He has a 144 pack-year smoking history. He quit smokeless tobacco use about 9 years ago. He reports that he does not drink alcohol or use illicit drugs.  Allergies: No Known Allergies  Medications:   Results for orders placed during the hospital encounter of 12/16/11 (from the past 48 hour(s))  COMPREHENSIVE METABOLIC PANEL     Status: Abnormal   Collection Time   12/16/11  4:21 PM      Component Value Range Comment   Sodium 139  135 - 145 mEq/L    Potassium 4.4  3.5 - 5.1 mEq/L    Chloride 98  96 - 112 mEq/L    CO2 32  19 - 32 mEq/L    Glucose, Bld 96  70 - 99 mg/dL    BUN 18  6 - 23 mg/dL    Creatinine, Ser 1.61  0.50 - 1.35 mg/dL    Calcium 9.4  8.4 - 09.6 mg/dL    Total Protein 7.3  6.0 - 8.3 g/dL    Albumin 3.6  3.5 - 5.2 g/dL    AST 17  0 - 37 U/L    ALT 11  0 - 53 U/L    Alkaline Phosphatase 75  39 - 117 U/L    Total Bilirubin 0.4  0.3 - 1.2 mg/dL    GFR calc non Af Amer 69 (*) >90 mL/min    GFR calc Af Amer 81 (*) >90 mL/min   CBC     Status: Abnormal   Collection Time   12/16/11  4:21 PM      Component Value Range Comment   WBC 16.3 (*) 4.0 - 10.5 K/uL    RBC 3.53 (*) 4.22 - 5.81 MIL/uL    Hemoglobin 10.4 (*) 13.0 - 17.0 g/dL    HCT 16.1 (*) 09.6 - 52.0 %    MCV 90.7  78.0 - 100.0 fL    MCH 29.5  26.0 - 34.0 pg    MCHC 32.5  30.0 - 36.0 g/dL    RDW 04.5  40.9 - 81.1 %    Platelets 213  150 - 400 K/uL   DIFFERENTIAL     Status: Abnormal   Collection Time   12/16/11  4:21 PM      Component Value Range Comment   Neutrophils Relative 84 (*) 43 - 77 %    Neutro Abs 13.7 (*) 1.7 - 7.7 K/uL    Lymphocytes Relative 6 (*) 12 - 46 %    Lymphs Abs 1.0  0.7 - 4.0 K/uL    Monocytes Relative 9  3 - 12 %    Monocytes Absolute 1.4 (*) 0.1 - 1.0 K/uL    Eosinophils Relative 1  0 - 5 %    Eosinophils Absolute 0.1  0.0 - 0.7 K/uL    Basophils Relative 0  0 - 1 %    Basophils Absolute 0.0  0.0 - 0.1 K/uL   TROPONIN I     Status: Normal   Collection Time     12/16/11  4:21 PM      Component Value Range Comment   Troponin I <0.30  <0.30 ng/mL   URINALYSIS, DIPSTICK ONLY     Status: Abnormal   Collection Time   12/16/11  5:34 PM      Component Value Range Comment   Specific Gravity, Urine 1.013  1.005 - 1.030    pH 6.0  5.0 - 8.0    Glucose, UA NEGATIVE  NEGATIVE mg/dL    Hgb urine dipstick NEGATIVE  NEGATIVE    Bilirubin Urine NEGATIVE  NEGATIVE    Ketones, ur NEGATIVE  NEGATIVE mg/dL    Protein, ur 30 (*) NEGATIVE mg/dL    Urobilinogen, UA 0.2  0.0 - 1.0 mg/dL    Nitrite NEGATIVE  NEGATIVE    Leukocytes, UA TRACE (*) NEGATIVE     Ct Head Wo Contrast  12/16/2011  *RADIOLOGY REPORT*  Clinical Data: Left-sided weakness.  Facial droop for 2 days. Gradual worsening.  Syncopal episode today.  CT HEAD WITHOUT CONTRAST  Technique:  Contiguous axial images were obtained from the base of the skull through the vertex without contrast.  Comparison: 01/23/2011.  Findings: Right frontal subdural fluid collection is present which is new compared to the prior exam of 01/23/2011.  There is a tiny amount of high attenuation blood along the inner table of the right frontal bone (image 11 series 2).  Maximal thickness of the subdural fluid collection is 12 mm.  No midline shift.  The focal chronic ischemia is present adjacent to the frontal horn of the right lateral ventricle.  Atrophy and chronic ischemic white matter disease appears similar to prior.  Left sub insular lacunar infarct.  Intracranial atherosclerosis.  Lacunar infarct in the left pons again noted.  Mastoid air cells clear.  Chronic paranasal sinus disease is unchanged.  IMPRESSION: 1.  Mixed attenuation right subdural collection, with small amount of acute or subacute hemorrhage. Findings compatible with acute on chronic subdural hematoma or small subdural hematoma superimposed on subdural hygroma.  Mild mass effect without midline shift. Maximal thickness 12 mm. 2.  Underlying atrophy and chronic  ischemic white matter disease without interval change. 3.  Chronic paranasal sinus disease.  Critical Value/emergent results were called by telephone at the time of interpretation on 12/16/2011  at 1703 hours  to  Dahlia Byes, RN, who verbally acknowledged these results.  Original Report Authenticated By: Andreas Newport, M.D.   Dg Chest Portable 1 View  12/16/2011  *RADIOLOGY REPORT*  Clinical Data: Loss of consciousness.  Extremity weakness. Syncope.  PORTABLE CHEST - 1 VIEW  Comparison: 07/12/2011.  Findings: Cardiomegaly.  Median sternotomy.  Cardiac pacemaker / AICD with multiple leads.  No airspace disease.  No effusion. Patient is rotated to the left.  IMPRESSION: Cardiomegaly without failure. Postoperative and postprocedural changes of the chest without acute cardiopulmonary disease.  Original Report Authenticated By: Andreas Newport, M.D.    @ROS @ Blood pressure 133/48, pulse 77, temperature 98.1 F (36.7 C), temperature source Oral, resp. rate 18, SpO2 99.00%. Patient is awake alert oriented x3 pupils are equal round and reactive to light extraocular movements are intact cranial nerves appear to be intact with no evidence of facial droop. Operatively strength is 5 out of 5 in his right upper extremities with deltoid biceps triceps wrist flexion extension and intrinsics left originally does appear to have some mild weakness at 4+ out of 5 probably picked up with a positive pronator drift. Lower [place] strength is 5 out of 5 in the right lower [place] he has 5 out of 5 strength proximally in his left looks to me but he is weak on dorsiflexion is a chronic footdrop as a sequela of his back surgery.  Assessment/Plan: 76 year old gentleman presents with some intermittent weakness that he describes as being in his left arm and left leg but also involving his right hand is good CT scan it shows a very small amount of acute blood and a long-standing old chronic subdural that appears to be causing minimal  if any mass effect in his brain and significant amount of cortical atrophy to explain this fluid collection. The last CT scan prior this was July 2012 with there was a small amount of subdural fluid however it was not as prominent. The subdural fluid collection is deaf and not acute I do not see how to be explain the weakness is experiencing unless it started to cause some element of seizure activity or cortical irritation however I do not see significant mass effect on hemispheres I do not think this is likely. I was concerned that he may be having episodes of TIAs or stroke CT of significant cardiovascular and peripheral vascular disease history with previous bypass surgery defibrillator placement known arterial occlusive disease in his lower extremities. So I recommended medical for evaluation and admission neurology consult possible EEG possible stroke workup continued observation with a followup head CT in the morning. A workup will be limited as with his the fibular to get an MRI scan unless this is one of the more recent MRI compatible defibrillators.  Leaman Abe P 12/16/2011, 6:15 PM

## 2011-12-16 NOTE — ED Provider Notes (Signed)
History     CSN: [id number]  Arrival date & time 12/16/11  1554   First MD Initiated Contact with Patient 12/16/11 1559      Chief Complaint  Patient presents with  . Loss of Consciousness  . Extremity Weakness    (Consider location/radiation/quality/duration/timing/severity/associated sxs/prior treatment) HPI  76 year old woman with past or history of heart failure, COPD,  heart disease, low back pain he self caths secondary to nerve damage, presenting today status post fainting spell at home at the toilet. After the fall he could not get up. He had to call for help to get up. He endorses feeling poorly for about 2-1/2 or 3 days. He also endorses being unable to use his left hand well for at least 36 hours. He says he can't control it and it feels weak. He denies any respiratory difficulty about baseline. He denies any paroxysmal nocturnal dyspnea, or any dyspnea with exertion above baseline. He denies nay chest pain, confusion, difficulty with speech, or numbness. Vital signs were stable on arrival  Past Medical History  Diagnosis Date  . Hypokalemia   . Colon cancer   . PVD (peripheral vascular disease)   . HTN (hypertension)   . Automatic implantable cardiac defibrillator in situ   . CHF (congestive heart failure)   . Ventricular tachycardia   . Ischemic heart disease, chronic   . Dyslipidemia   . CAD (coronary artery disease)   . COPD (chronic obstructive pulmonary disease)   . Emphysema   . On home oxygen therapy   . Difficult intubation     Past Surgical History  Procedure Date  . Lumbar back surgery     x2  . Cholecystectomy   . Iliac artery stent     left  . Implantation of medtronic concerto bi-v icd 04-10-08    [name] MD  . Colonoscopy   . Polypectomy     Family History  Problem Relation Age of Onset  . Heart failure Father     History  Substance Use Topics  . Smoking status: Former Smoker -- 3.0 packs/day for 48 years    Types: Cigarettes      Quit date: 07/03/2002  . Smokeless tobacco: Former Neurosurgeon    Quit date: 07/11/2002  . Alcohol Use: No      Review of Systems Constitutional: Negative for fever and chills.  HENT: Negative for ear pain, sore throat and trouble swallowing.   Eyes: Negative for pain and visual disturbance.  Respiratory: Negative for cough and shortness of breath.   Cardiovascular: Negative for chest pain and leg swelling.  Gastrointestinal: Negative for nausea, vomiting, abdominal pain and diarrhea.  Genitourinary: Negative for dysuria, urgency and frequency.  Musculoskeletal: Negative for back pain and joint swelling.  Skin: Negative for rash and wound.  Neurological: Negative for dizziness, POS syncope, speech difficulty, POS weakness and numbness.   Allergies  Review of patient's allergies indicates no known allergies.  Home Medications   No current outpatient prescriptions on file.  BP 148/64  Pulse 69  Temp 98 F (36.7 C) (Oral)  Resp 20  Ht 5\' 7"  (1.702 m)  Wt 175 lb (79.379 kg)  BMI 27.41 kg/m2  SpO2 94%  Physical Exam Consitutional: Pt in no acute distress.   Head: Normocephalic and atraumatic.  Eyes: Extraocular motion intact, no scleral icterus Neck: Supple without meningismus, mass, or overt JVD Respiratory: Effort normal and breath sounds normal. No respiratory distress. CV: Heart regular rate and regular rhythm (sinus), no  obvious murmurs.  Pulses +2 and symmetric Abdomen: Soft, non-tender, non-distended. No rebound or guarding.  MSK: Extremities are atraumatic without deformity, ROM intact Skin: Warm, dry, intact Neuro: Alert and oriented, no motor deficit noted.  PERRL.  EOMI.  asymmetric arm raises, left arm unable to be raised  2 horizontal.  Right leg, hips, knee and ankle, arm and wrist strength maintained >=4/5.  Left leg demonstrates weakness in the hip flexors 3/5 .  Left arm abduction weak .  FTN and RAM normal.  CNs 2-10 normal.  Left trapezius weak      Psychiatric: Mood and affect are normal  EKG:  Rate: 76 Rythym Sinus  Interval 216 ms. Axis: LAD Wide QRS. No gross ST or T-wave abnormalities appreciated.  No longer paced rhythm from previous.      ED Course  Procedures (including critical care time)  Labs Reviewed  COMPREHENSIVE METABOLIC PANEL - Abnormal; Notable for the following:    GFR calc non Af Amer 69 (*)     GFR calc Af Amer 81 (*)     All other components within normal limits  CBC - Abnormal; Notable for the following:    WBC 16.3 (*)     RBC 3.53 (*)     Hemoglobin 10.4 (*)     HCT 32.0 (*)     All other components within normal limits  DIFFERENTIAL - Abnormal; Notable for the following:    Neutrophils Relative 84 (*)     Neutro Abs 13.7 (*)     Lymphocytes Relative 6 (*)     Monocytes Absolute 1.4 (*)     All other components within normal limits  URINALYSIS, DIPSTICK ONLY - Abnormal; Notable for the following:    Protein, ur 30 (*)     Leukocytes, UA TRACE (*)     All other components within normal limits  GLUCOSE, CAPILLARY - Abnormal; Notable for the following:    Glucose-Capillary 134 (*)     All other components within normal limits  TROPONIN I  URINE RAPID DRUG SCREEN (HOSP PERFORMED)  GLUCOSE, POCT (MANUAL RESULT ENTRY)  GLUCOSE, POCT (MANUAL RESULT ENTRY)  HEMOGLOBIN A1C  LIPID PANEL  GLUCOSE, POCT (MANUAL RESULT ENTRY)  GLUCOSE, POCT (MANUAL RESULT ENTRY)  GLUCOSE, POCT (MANUAL RESULT ENTRY)   Ct Head Wo Contrast  12/16/2011  *RADIOLOGY REPORT*  Clinical Data: Left-sided weakness.  Facial droop for 2 days. Gradual worsening.  Syncopal episode today.  CT HEAD WITHOUT CONTRAST  Technique:  Contiguous axial images were obtained from the base of the skull through the vertex without contrast.  Comparison: 01/23/2011.  Findings: Right frontal subdural fluid collection is present which is new compared to the prior exam of 01/23/2011.  There is a tiny amount of high attenuation blood along the inner  table of the right frontal bone (image 11 series 2).  Maximal thickness of the subdural fluid collection is 12 mm.  No midline shift.  The focal chronic ischemia is present adjacent to the frontal horn of the right lateral ventricle.  Atrophy and chronic ischemic white matter disease appears similar to prior.  Left sub insular lacunar infarct.  Intracranial atherosclerosis.  Lacunar infarct in the left pons again noted.  Mastoid air cells clear.  Chronic paranasal sinus disease is unchanged.  IMPRESSION: 1.  Mixed attenuation right subdural collection, with small amount of acute or subacute hemorrhage. Findings compatible with acute on chronic subdural hematoma or small subdural hematoma superimposed on subdural hygroma.  Mild mass  effect without midline shift. Maximal thickness 12 mm. 2.  Underlying atrophy and chronic ischemic white matter disease without interval change. 3.  Chronic paranasal sinus disease.  Critical Value/emergent results were called by telephone at the time of interpretation on 12/16/2011  at 1703 hours  to  Dahlia Byes, RN, who verbally acknowledged these results.  Original Report Authenticated By: Andreas Newport, M.D.   Dg Chest Portable 1 View  12/16/2011  *RADIOLOGY REPORT*  Clinical Data: Loss of consciousness.  Extremity weakness. Syncope.  PORTABLE CHEST - 1 VIEW  Comparison: 07/12/2011.  Findings: Cardiomegaly.  Median sternotomy.  Cardiac pacemaker / AICD with multiple leads.  No airspace disease.  No effusion. Patient is rotated to the left.  IMPRESSION: Cardiomegaly without failure. Postoperative and postprocedural changes of the chest without acute cardiopulmonary disease.  Original Report Authenticated By: Andreas Newport, M.D.    Clinical impression: Acute on chronic bleed   MDM  VV syncopal episode today at stool.  Inability to stand upon recovery, likely secondary to recent but not acute stroke. Neurological exam demonstrates left-sided weakness.  I discussed this with  patient and spouse, this is not acute weakness.  No symptoms to suggest acute coronary syndrome or exacerbation of heart failure. No symptoms to suggest COPD exacerbation. Chest x-ray EKG lab work CT scan head.  CT scan demonstrates small acute on larger chronic head bleed.  Neurosurgery consulted, no intervention at this time, admit to medicine.   patient admitted to medicine stable.         Larrie Kass, MD 12/17/11 [phone]

## 2011-12-16 NOTE — ED Notes (Signed)
Per EMS, pt states has been having some left sided weakness and sensation of facial droop for 2 days. sts has been getting gradually worse. Pt was on the commode this am and had a syncopal episode while bearing down to have BM. Pt hit head on tile floor in left temporal area. Pt A&Ox3. Pt from home.

## 2011-12-17 ENCOUNTER — Encounter (HOSPITAL_COMMUNITY): Payer: Self-pay | Admitting: Neurology

## 2011-12-17 ENCOUNTER — Inpatient Hospital Stay (HOSPITAL_COMMUNITY): Payer: Medicare Other

## 2011-12-17 DIAGNOSIS — I10 Essential (primary) hypertension: Secondary | ICD-10-CM

## 2011-12-17 DIAGNOSIS — G459 Transient cerebral ischemic attack, unspecified: Secondary | ICD-10-CM

## 2011-12-17 DIAGNOSIS — I6340 Cerebral infarction due to embolism of unspecified cerebral artery: Secondary | ICD-10-CM

## 2011-12-17 DIAGNOSIS — R55 Syncope and collapse: Secondary | ICD-10-CM

## 2011-12-17 DIAGNOSIS — S065X9A Traumatic subdural hemorrhage with loss of consciousness of unspecified duration, initial encounter: Secondary | ICD-10-CM

## 2011-12-17 LAB — HEMOGLOBIN A1C: Mean Plasma Glucose: 128 mg/dL — ABNORMAL HIGH (ref <117)

## 2011-12-17 LAB — PROTIME-INR
INR: 1.69 — ABNORMAL HIGH (ref 0.00–1.49)
Prothrombin Time: 20.2 seconds — ABNORMAL HIGH (ref 11.6–15.2)

## 2011-12-17 LAB — LIPID PANEL
HDL: 38 mg/dL — ABNORMAL LOW (ref >39)
Total CHOL/HDL Ratio: 2.8 RATIO
Triglycerides: 86 mg/dL (ref <150)

## 2011-12-17 LAB — GLUCOSE, CAPILLARY
Glucose-Capillary: 102 mg/dL — ABNORMAL HIGH (ref 70–99)
Glucose-Capillary: 127 mg/dL — ABNORMAL HIGH (ref 70–99)
Glucose-Capillary: 144 mg/dL — ABNORMAL HIGH (ref 70–99)

## 2011-12-17 MED ORDER — NITROGLYCERIN 0.2 MG/HR TD PT24
0.2000 mg | MEDICATED_PATCH | Freq: Every day | TRANSDERMAL | Status: DC
  Administered 2011-12-17 – 2011-12-19 (×3): 0.2 mg via TRANSDERMAL
  Filled 2011-12-17 (×3): qty 1

## 2011-12-17 MED ORDER — SIMVASTATIN 20 MG PO TABS
20.0000 mg | ORAL_TABLET | Freq: Every day | ORAL | Status: DC
  Administered 2011-12-17 – 2011-12-18 (×2): 20 mg via ORAL
  Filled 2011-12-17 (×4): qty 1

## 2011-12-17 MED ORDER — POTASSIUM CHLORIDE CRYS ER 20 MEQ PO TBCR
20.0000 meq | EXTENDED_RELEASE_TABLET | Freq: Every day | ORAL | Status: DC
  Administered 2011-12-17 – 2011-12-19 (×3): 20 meq via ORAL
  Filled 2011-12-17 (×3): qty 1

## 2011-12-17 MED ORDER — NITROGLYCERIN 0.4 MG SL SUBL: 0.4000 mg | SUBLINGUAL_TABLET | SUBLINGUAL | Status: DC | PRN

## 2011-12-17 MED ORDER — ALBUTEROL SULFATE HFA 108 (90 BASE) MCG/ACT IN AERS
2.0000 | INHALATION_SPRAY | Freq: Four times a day (QID) | RESPIRATORY_TRACT | Status: DC | PRN
  Filled 2011-12-17: qty 6.7

## 2011-12-17 MED ORDER — IRBESARTAN 300 MG PO TABS
300.0000 mg | ORAL_TABLET | Freq: Every day | ORAL | Status: DC
  Administered 2011-12-17 – 2011-12-19 (×3): 300 mg via ORAL
  Filled 2011-12-17 (×3): qty 1

## 2011-12-17 MED ORDER — AMLODIPINE BESYLATE 5 MG PO TABS
5.0000 mg | ORAL_TABLET | Freq: Every day | ORAL | Status: DC
  Administered 2011-12-17 – 2011-12-19 (×3): 5 mg via ORAL
  Filled 2011-12-17 (×3): qty 1

## 2011-12-17 MED ORDER — GUAIFENESIN ER 600 MG PO TB12
600.0000 mg | ORAL_TABLET | Freq: Two times a day (BID) | ORAL | Status: DC | PRN
  Administered 2011-12-17: 600 mg via ORAL
  Filled 2011-12-17: qty 1

## 2011-12-17 MED ORDER — FUROSEMIDE 40 MG PO TABS: 40.0000 mg | ORAL_TABLET | Freq: Every day | ORAL | Status: DC

## 2011-12-17 MED ORDER — DIGOXIN 125 MCG PO TABS
125.0000 ug | ORAL_TABLET | Freq: Every day | ORAL | Status: DC
  Administered 2011-12-17 – 2011-12-19 (×3): 125 ug via ORAL
  Filled 2011-12-17 (×3): qty 1

## 2011-12-17 MED ORDER — FUROSEMIDE 20 MG PO TABS
20.0000 mg | ORAL_TABLET | Freq: Every day | ORAL | Status: DC
  Administered 2011-12-17: 20 mg via ORAL
  Filled 2011-12-17: qty 1

## 2011-12-17 MED ORDER — METFORMIN HCL 500 MG PO TABS
500.0000 mg | ORAL_TABLET | Freq: Two times a day (BID) | ORAL | Status: DC
  Administered 2011-12-17 – 2011-12-19 (×4): 500 mg via ORAL
  Filled 2011-12-17 (×6): qty 1

## 2011-12-17 MED ORDER — PREDNISONE 10 MG PO TABS
10.0000 mg | ORAL_TABLET | Freq: Every day | ORAL | Status: DC
  Administered 2011-12-18 – 2011-12-19 (×2): 10 mg via ORAL
  Filled 2011-12-17 (×3): qty 1

## 2011-12-17 MED ORDER — BUDESONIDE 0.5 MG/2ML IN SUSP
0.5000 mg | Freq: Two times a day (BID) | RESPIRATORY_TRACT | Status: DC
  Administered 2011-12-17 – 2011-12-19 (×4): 0.5 mg via RESPIRATORY_TRACT
  Filled 2011-12-17 (×6): qty 2

## 2011-12-17 MED ORDER — TIOTROPIUM BROMIDE MONOHYDRATE 18 MCG IN CAPS
18.0000 ug | ORAL_CAPSULE | Freq: Every day | RESPIRATORY_TRACT | Status: DC
  Administered 2011-12-18 – 2011-12-19 (×2): 18 ug via RESPIRATORY_TRACT
  Filled 2011-12-17: qty 5

## 2011-12-17 MED ORDER — FUROSEMIDE 40 MG PO TABS
40.0000 mg | ORAL_TABLET | Freq: Every day | ORAL | Status: DC
  Administered 2011-12-17 – 2011-12-18 (×2): 40 mg via ORAL
  Filled 2011-12-17 (×4): qty 1

## 2011-12-17 MED ORDER — FERROUS SULFATE 325 (65 FE) MG PO TABS
325.0000 mg | ORAL_TABLET | Freq: Every day | ORAL | Status: DC
  Administered 2011-12-17 – 2011-12-19 (×3): 325 mg via ORAL
  Filled 2011-12-17 (×4): qty 1

## 2011-12-17 MED ORDER — CARVEDILOL 12.5 MG PO TABS
12.5000 mg | ORAL_TABLET | Freq: Two times a day (BID) | ORAL | Status: DC
  Administered 2011-12-17 – 2011-12-19 (×5): 12.5 mg via ORAL
  Filled 2011-12-17 (×7): qty 1

## 2011-12-17 MED ORDER — FUROSEMIDE 80 MG PO TABS
80.0000 mg | ORAL_TABLET | Freq: Every day | ORAL | Status: DC
  Administered 2011-12-18 – 2011-12-19 (×2): 80 mg via ORAL
  Filled 2011-12-17 (×2): qty 1

## 2011-12-17 NOTE — Progress Notes (Signed)
*  PRELIMINARY RESULTS* Vascular Ultrasound Carotid Duplex (Doppler) has been completed.  Preliminary findings: Right= 60-79% ICA stenosis, mid end of scale. Left= 40-59% ICA stenosis, mid end of scale. Bilateral antegrade vertebral flow.  Farrel Demark, RDMS 12/17/2011, 3:26 PM

## 2011-12-17 NOTE — ED Provider Notes (Signed)
  I performed a history and physical examination of [name] and discussed his management with Dr. Rainey Pines.  I agree with the history, physical, assessment, and plan of care, with the following exceptions: None This elderly male now presents with weakness, and on exam has left-sided deficits.  The patient's evaluation was most notable for an acute on chronic subdural hematoma.  I discussed the patient's case with the neurosurgeon, and we admitted the patient to the medicine service for further evaluation and management.  I reviewed imaging and the ecg myself (and agree with the interpretation).  Elyse Jarvis, MD 12/17/11 [phone]

## 2011-12-17 NOTE — Consult Note (Signed)
Chief Complaint: Episode of loss of consciousness with left side weakness and right subdural hematoma.  HPI: [name] is an 76 y.o. male a history of coronary artery disease, COPD, congestive heart failure, ventricular tachycardia with defibrillator implantation and right subdural hematoma, presenting with a history of loss of consciousness at about 3:30 on 12/16/2011 when he stood up from the commode in his bathroom. He apparently struck his head as he fell. He thinks he was unconscious for less than half an hour. He noted left-sided weakness on waking up and was well, able to get up from the floor. He noticed his speech was slurred when he finally got to the telephone to call EMS. Left-sided weakness began to improve on the way to the emergency room. Patient has been on Coumadin and INR was 1.69. There is no history of atrial fibrillation. Strength of his left extremities has continued to improve since admission. CT scan of his head showed right subdural hematoma with a slight amount of acute to subacute hemorrhage but primarily chronic subdural hematoma or subdural hygroma noted. Maximum diameter was 12 mm, with a 2 mm right-to-left midline shift. No neurosurgical intervention was deemed necessary per Dr.Cram. Still previous history of loss of consciousness. Patient thinks he had a TIA in the 4s. CT scan also showed scattered lacunar infarctions which were old. He was not a candidate for thrombolytic therapy with TPA because of subdural hematoma and fairly rapidly resolving left hemiparesthesias.  LSN: 3:30 PM on 12/16/2011 tPA Given: No: Resolving weakness, and right subdural hematoma. MRankin: 0  Past Medical History  Diagnosis Date  . Hypokalemia   . Colon cancer   . PVD (peripheral vascular disease)   . HTN (hypertension)   . Automatic implantable cardiac defibrillator in situ   . CHF (congestive heart failure)   . Ventricular tachycardia   . Ischemic heart disease, chronic   .  Dyslipidemia   . CAD (coronary artery disease)   . COPD (chronic obstructive pulmonary disease)   . Emphysema   . On home oxygen therapy   . Difficult intubation     Family History  Problem Relation Age of Onset  . Heart failure Father      Medications:  Scheduled:   . sodium chloride   Intravenous STAT  . albuterol  2.5 mg Nebulization Q6H  . amLODipine  5 mg Oral Daily  . budesonide  0.5 mg Nebulization BID  . carvedilol  12.5 mg Oral BID WC  . digoxin  125 mcg Oral Daily  . ferrous sulfate  325 mg Oral Q breakfast  . furosemide  40 mg Oral Q2000  . furosemide  80 mg Oral Daily  . insulin aspart  0-5 Units Subcutaneous QHS  . insulin aspart  0-9 Units Subcutaneous TID WC  . irbesartan  300 mg Oral Daily  . metFORMIN  500 mg Oral BID WC  . nitroGLYCERIN  0.2 mg Transdermal Daily  . potassium chloride SA  20 mEq Oral Daily  . predniSONE  10 mg Oral Q breakfast  . simvastatin  20 mg Oral QHS  . tiotropium  18 mcg Inhalation Daily  . DISCONTD: furosemide  20 mg Oral Daily  . DISCONTD: furosemide  40 mg Oral Q1500   ZOX:WRUEAVWUJ, albuterol, guaiFENesin, nitroGLYCERIN   Physical Examination: Blood pressure 131/55, pulse 60, temperature 98.8 F (37.1 C), temperature source Oral, resp. rate 18, height 5\' 7"  (1.702 m), weight 79.379 kg (175 lb), SpO2 95.00%.  Neurologic Examination: Mental Status:  Alert, oriented, thought content appropriate.  Speech fluent without evidence of aphasia. Able to follow commands without difficulty. Cranial Nerves: II-Visual fields were normal. III/IV/VI-Pupils were equal and reacted. Extraocular movements were full and conjugate.    V/VII-no facial numbness; minimal left lower facial weakness. VIII-normal. X-normal speech and symmetrical palatal movement. Motor: Mild pronator drift on the left side involving upper extremity; minimal hip flexor weakness on the left. Motor exam is otherwise unremarkable. Sensory: Normal throughout. Deep  Tendon Reflexes: Trace to 1+  and symmetric. Plantars: Mute bilaterally Cerebellar: Normal finger-to-nose testing septal minimal intention tremor bilaterally. Carotid auscultation: Normal   Assessment: 76 y.o. male with probable subcortical acute right ischemic small vessel cerebral infarction with minimal residual left hemiparesis at this point. 2. Probable syncopal spell at onset of acute stroke. Generalized seizure is unlikely but cannot be completely ruled out. 3. Right subdural hematoma with slight acute to subacute hemorrhage, but primarily chronic subdural hematoma with slight mass effect.  Stroke Risk Factors - diabetes mellitus, hyperlipidemia and hypertension  Plan: 1. HgbA1c, fasting lipid panel 2. MRI, MRA contraindicated because of implanted defibrillator 3. PT consult, OT consult 4. Echocardiogram 5. Carotid dopplers 6. Prophylactic therapy-Anticoagulation: Coumadin when safe to resume. 7. Risk factor modification 8. Telemetry monitoring  C.R. Roseanne Reno, MD [facility] [phone]  12/17/2011, 12:34 PM

## 2011-12-17 NOTE — Progress Notes (Signed)
Subjective: L sided weakness is improved  Objective: Vital signs in last 24 hours: Temp:  [98 F (36.7 C)-98.8 F (37.1 C)] 98.8 F (37.1 C) (06/16 1022) Pulse Rate:  [58-77] 60  (06/16 1022) Resp:  [18-22] 18  (06/16 1022) BP: (128-148)/(42-82) 131/55 mmHg (06/16 1022) SpO2:  [94 %-99 %] 95 % (06/16 1022) FiO2 (%):  [1.5 %-28 %] 28 % (06/16 1009) Weight:  [79.379 kg (175 lb)] 79.379 kg (175 lb) (06/15 2300) Weight change:  Last BM Date: 12/16/11  Intake/Output from previous day: 06/15 0701 - 06/16 0700 In: -  Out: 600 [Urine:600] Intake/Output this shift: Total I/O In: 360 [P.O.:360] Out: -   General appearance: alert and cooperative Neck: no carotid bruit Resp: clear to auscultation bilaterally Cardio: regular rate and rhythm, S1, S2 normal, no murmur, click, rub or gallop GI: soft, non-tender; bowel sounds normal; no masses,  no organomegaly Neurologic: Mental status: Alert, oriented, thought content appropriate Cranial nerves: normal Motor: strength 5/5 except 5-/5 LLE  Lab Results:  Basename 12/16/11 1621  WBC 16.3*  HGB 10.4*  HCT 32.0*  PLT 213   BMET  Basename 12/16/11 1621  NA 139  K 4.4  CL 98  CO2 32  GLUCOSE 96  BUN 18  CREATININE 1.03  CALCIUM 9.4    Studies/Results: Ct Head Wo Contrast  12/17/2011  *RADIOLOGY REPORT*  Clinical Data: Follow-up for subdural hematoma.  CT HEAD WITHOUT CONTRAST  Technique:  Contiguous axial images were obtained from the base of the skull through the vertex without contrast.  Comparison: 12/16/2011.  Findings: There is again a small mixed attenuation extra-axial fluid collection overlying the right cerebral convexity (predominately in the frontal region), consistent with a subdural hematoma.  The predominantly low attenuation collection and has some higher attenuation internal areas, suggesting regions of both chronic and subacute hemorrhage. This measures up to 12 mm in thickness.  No evidence of recurrent acute  hemorrhage is identified.  This continues to exhibit some mild mass effect upon the adjacent to right cerebral hemisphere, and there is approximately 2 mm of right-to-left midline shift.  There is a background of mild cerebral and cerebellar atrophy.  Well defined foci of low attenuation in the left external capsule are again noted, consistent with old lacunar infarctions.   Likewise there is a well-defined focus of low attenuation within the left side of the pons, likely represents sequela of prior infarction. There is also some patchy and confluent areas of decreased attenuation throughout the deep and periventricular white matter of the cerebral hemispheres bilaterally, most consistent with chronic microvascular ischemic changes. No hydrocephalous.   No acute displaced skull fractures are identified.  Extensive mucoperiosteal thickening is noted throughout the visualized paranasal sinuses, suggesting chronic sinusitis.  Probable prior bilateral maxillary antrectomies.  IMPRESSION: 1.  Overall, the appearance the brain is very similar to yesterday's examination, again demonstrating a mixed attenuation chronic and subacute right-sided subdural hematoma which measures up to 12 mm in thickness and is associated with 2 mm of right-to- left midline shift. 2.  Cerebral and cerebellar atrophy, old lacunar infarctions and chronic microvascular ischemic changes throughout the cerebral white matter redemonstrated, as above. 3.  Changes compatible with chronic paranasal sinusitis.  Original Report Authenticated By: Florencia Reasons, M.D.   Ct Head Wo Contrast  12/16/2011  *RADIOLOGY REPORT*  Clinical Data: Left-sided weakness.  Facial droop for 2 days. Gradual worsening.  Syncopal episode today.  CT HEAD WITHOUT CONTRAST  Technique:  Contiguous axial images  were obtained from the base of the skull through the vertex without contrast.  Comparison: 01/23/2011.  Findings: Right frontal subdural fluid collection is present  which is new compared to the prior exam of 01/23/2011.  There is a tiny amount of high attenuation blood along the inner table of the right frontal bone (image 11 series 2).  Maximal thickness of the subdural fluid collection is 12 mm.  No midline shift.  The focal chronic ischemia is present adjacent to the frontal horn of the right lateral ventricle.  Atrophy and chronic ischemic white matter disease appears similar to prior.  Left sub insular lacunar infarct.  Intracranial atherosclerosis.  Lacunar infarct in the left pons again noted.  Mastoid air cells clear.  Chronic paranasal sinus disease is unchanged.  IMPRESSION: 1.  Mixed attenuation right subdural collection, with small amount of acute or subacute hemorrhage. Findings compatible with acute on chronic subdural hematoma or small subdural hematoma superimposed on subdural hygroma.  Mild mass effect without midline shift. Maximal thickness 12 mm. 2.  Underlying atrophy and chronic ischemic white matter disease without interval change. 3.  Chronic paranasal sinus disease.  Critical Value/emergent results were called by telephone at the time of interpretation on 12/16/2011  at 1703 hours  to  Dahlia Byes, RN, who verbally acknowledged these results.  Original Report Authenticated By: Andreas Newport, M.D.   Dg Chest Portable 1 View  12/16/2011  *RADIOLOGY REPORT*  Clinical Data: Loss of consciousness.  Extremity weakness. Syncope.  PORTABLE CHEST - 1 VIEW  Comparison: 07/12/2011.  Findings: Cardiomegaly.  Median sternotomy.  Cardiac pacemaker / AICD with multiple leads.  No airspace disease.  No effusion. Patient is rotated to the left.  IMPRESSION: Cardiomegaly without failure. Postoperative and postprocedural changes of the chest without acute cardiopulmonary disease.  Original Report Authenticated By: Andreas Newport, M.D.    Medications: I have reviewed the patient's current medications.  Assessment/Plan: Principal Problem:  *Syncope and collapse  presumed syncope post BM with head truama, woke up with some confusion and L sided weakness.  MS changes quickly resolved. L weakness slowly resolving.  TIA vs seizure?  Cannot have MRI.  TIA workup ordered. Ask neurology to see Active Problems:  HYPERTENSION OK  CORONARY ARTERY DISEASE stable  COPD stable  Subdural Hemorrhage  Appreciate neurosurgery input. No surgery needed.  Diabetes Mellitus  Controlled, in metformin and SSI  Anticoagulation  Check PT.   LOS: 1 day   Tymira Horkey JOSEPH 12/17/2011, 10:58 AM

## 2011-12-17 NOTE — Progress Notes (Signed)
Subjective: Patient reports Is feeling much better this morning no more episodes of the left-sided weakness he was standing upw  Objective: Vital signs in last 24 hours: Temp:  [98 F (36.7 C)-98.4 F (36.9 C)] 98.2 F (36.8 C) (06/16 0745) Pulse Rate:  [58-77] 69  (06/16 0745) Resp:  [18-22] 18  (06/16 0745) BP: (128-148)/(42-82) 131/42 mmHg (06/16 0745) SpO2:  [94 %-99 %] 95 % (06/16 0745) FiO2 (%):  [1.5 %] 1.5 % (06/15 2218) Weight:  [79.379 kg (175 lb)] 79.379 kg (175 lb) (06/15 2300)  Intake/Output from previous day: 06/15 0701 - 06/16 0700 In: -  Out: 600 [Urine:600] Intake/Output this shift:    Awake alert oriented strength is 5 out of 5 and symmetric ambulate well  Lab Results:  Basename 12/16/11 1621  WBC 16.3*  HGB 10.4*  HCT 32.0*  PLT 213   BMET  Basename 12/16/11 1621  NA 139  K 4.4  CL 98  CO2 32  GLUCOSE 96  BUN 18  CREATININE 1.03  CALCIUM 9.4    Studies/Results: Ct Head Wo Contrast  12/17/2011  *RADIOLOGY REPORT*  Clinical Data: Follow-up for subdural hematoma.  CT HEAD WITHOUT CONTRAST  Technique:  Contiguous axial images were obtained from the base of the skull through the vertex without contrast.  Comparison: 12/16/2011.  Findings: There is again a small mixed attenuation extra-axial fluid collection overlying the right cerebral convexity (predominately in the frontal region), consistent with a subdural hematoma.  The predominantly low attenuation collection and has some higher attenuation internal areas, suggesting regions of both chronic and subacute hemorrhage. This measures up to 12 mm in thickness.  No evidence of recurrent acute hemorrhage is identified.  This continues to exhibit some mild mass effect upon the adjacent to right cerebral hemisphere, and there is approximately 2 mm of right-to-left midline shift.  There is a background of mild cerebral and cerebellar atrophy.  Well defined foci of low attenuation in the left external capsule  are again noted, consistent with old lacunar infarctions.   Likewise there is a well-defined focus of low attenuation within the left side of the pons, likely represents sequela of prior infarction. There is also some patchy and confluent areas of decreased attenuation throughout the deep and periventricular white matter of the cerebral hemispheres bilaterally, most consistent with chronic microvascular ischemic changes. No hydrocephalous.   No acute displaced skull fractures are identified.  Extensive mucoperiosteal thickening is noted throughout the visualized paranasal sinuses, suggesting chronic sinusitis.  Probable prior bilateral maxillary antrectomies.  IMPRESSION: 1.  Overall, the appearance the brain is very similar to yesterday's examination, again demonstrating a mixed attenuation chronic and subacute right-sided subdural hematoma which measures up to 12 mm in thickness and is associated with 2 mm of right-to- left midline shift. 2.  Cerebral and cerebellar atrophy, old lacunar infarctions and chronic microvascular ischemic changes throughout the cerebral white matter redemonstrated, as above. 3.  Changes compatible with chronic paranasal sinusitis.  Original Report Authenticated By: Florencia Reasons, M.D.   Ct Head Wo Contrast  12/16/2011  *RADIOLOGY REPORT*  Clinical Data: Left-sided weakness.  Facial droop for 2 days. Gradual worsening.  Syncopal episode today.  CT HEAD WITHOUT CONTRAST  Technique:  Contiguous axial images were obtained from the base of the skull through the vertex without contrast.  Comparison: 01/23/2011.  Findings: Right frontal subdural fluid collection is present which is new compared to the prior exam of 01/23/2011.  There is a tiny amount of high  attenuation blood along the inner table of the right frontal bone (image 11 series 2).  Maximal thickness of the subdural fluid collection is 12 mm.  No midline shift.  The focal chronic ischemia is present adjacent to the frontal  horn of the right lateral ventricle.  Atrophy and chronic ischemic white matter disease appears similar to prior.  Left sub insular lacunar infarct.  Intracranial atherosclerosis.  Lacunar infarct in the left pons again noted.  Mastoid air cells clear.  Chronic paranasal sinus disease is unchanged.  IMPRESSION: 1.  Mixed attenuation right subdural collection, with small amount of acute or subacute hemorrhage. Findings compatible with acute on chronic subdural hematoma or small subdural hematoma superimposed on subdural hygroma.  Mild mass effect without midline shift. Maximal thickness 12 mm. 2.  Underlying atrophy and chronic ischemic white matter disease without interval change. 3.  Chronic paranasal sinus disease.  Critical Value/emergent results were called by telephone at the time of interpretation on 12/16/2011  at 1703 hours  to  Dahlia Byes, RN, who verbally acknowledged these results.  Original Report Authenticated By: Andreas Newport, M.D.   Dg Chest Portable 1 View  12/16/2011  *RADIOLOGY REPORT*  Clinical Data: Loss of consciousness.  Extremity weakness. Syncope.  PORTABLE CHEST - 1 VIEW  Comparison: 07/12/2011.  Findings: Cardiomegaly.  Median sternotomy.  Cardiac pacemaker / AICD with multiple leads.  No airspace disease.  No effusion. Patient is rotated to the left.  IMPRESSION: Cardiomegaly without failure. Postoperative and postprocedural changes of the chest without acute cardiopulmonary disease.  Original Report Authenticated By: Andreas Newport, M.D.    Assessment/Plan: Hospital day 1 for evaluation of bilateral arm and hand weakness and left-sided weakness and difficulty ambulating followup CT scan is stable no extension of his hemorrhage very minimal to no mass effect on the right hemisphere I do not think that this warrants surgical intervention at this time. I do think there is a chance of this represents some irritation of possible seizure activity although the bilateral nature of his  upper extremity symptoms I think would obviate that. I would recommend continuing the TIA workup as well as possible EEG tomorrow per neurology.  LOS: 1 day     Crissie Aloi P 12/17/2011, 9:03 AM

## 2011-12-17 NOTE — Progress Notes (Signed)
  Echocardiogram 2D Echocardiogram has been performed.  [name] L 12/17/2011, 10:13 AM

## 2011-12-18 ENCOUNTER — Inpatient Hospital Stay (HOSPITAL_COMMUNITY): Payer: Medicare Other

## 2011-12-18 ENCOUNTER — Encounter (HOSPITAL_COMMUNITY): Payer: Self-pay | Admitting: Radiology

## 2011-12-18 DIAGNOSIS — S065X9A Traumatic subdural hemorrhage with loss of consciousness of unspecified duration, initial encounter: Secondary | ICD-10-CM | POA: Diagnosis present

## 2011-12-18 DIAGNOSIS — I6340 Cerebral infarction due to embolism of unspecified cerebral artery: Secondary | ICD-10-CM

## 2011-12-18 DIAGNOSIS — R55 Syncope and collapse: Secondary | ICD-10-CM

## 2011-12-18 DIAGNOSIS — S065XAA Traumatic subdural hemorrhage with loss of consciousness status unknown, initial encounter: Secondary | ICD-10-CM | POA: Diagnosis present

## 2011-12-18 LAB — GLUCOSE, CAPILLARY
Glucose-Capillary: 102 mg/dL — ABNORMAL HIGH (ref 70–99)
Glucose-Capillary: 96 mg/dL (ref 70–99)
Glucose-Capillary: 90 mg/dL (ref 70–99)

## 2011-12-18 MED ORDER — IOHEXOL 350 MG/ML SOLN
50.0000 mL | Freq: Once | INTRAVENOUS | Status: AC | PRN
  Administered 2011-12-18: 50 mL via INTRAVENOUS

## 2011-12-18 NOTE — Progress Notes (Signed)
Call from radiology. He has severe intracranial carotid disease as well as extracranial disease. Carotids display "string sign". Will discuss with neurology.

## 2011-12-18 NOTE — Plan of Care (Signed)
Problem: Phase I Progression Outcomes Goal: Voiding-avoid urinary catheter unless indicated Outcome: Not Met (add Reason) Pt has  Self in and out catheterized  Self at home

## 2011-12-18 NOTE — Progress Notes (Signed)
Subjective: He feels well this morning and thinks he is back to normal. On further questioning today, he states that most of his weakness was diffuse and not unilateral, but does admit he is not sure. He has no headache.  Objective:  Vital Signs: Filed Vitals:   12/17/11 1921 12/17/11 2055 12/18/11 0147 12/18/11 0600  BP:  141/58 140/44 151/65  Pulse: 62 60 61 62  Temp:  98.3 F (36.8 C) 98.1 F (36.7 C) 98.6 F (37 C)  TempSrc:  Oral Oral Oral  Resp: 18 18 18 18   Height:      Weight:      SpO2:  99% 99% 98%     EXAM: Motor exam seems symmetric.   Intake/Output Summary (Last 24 hours) at 12/18/11 0748 Last data filed at 12/18/11 0630  Gross per 24 hour  Intake    720 ml  Output   1750 ml  Net  -1030 ml    Lab Results:  [facility] 12/16/11 1621  NA 139  K 4.4  CL 98  CO2 32  GLUCOSE 96  BUN 18  CREATININE 1.03  CALCIUM 9.4  MG --  PHOS --    Basename 12/16/11 1621  AST 17  ALT 11  ALKPHOS 75  BILITOT 0.4  PROT 7.3  ALBUMIN 3.6   No results found for this basename: LIPASE:2,AMYLASE:2 in the last 72 hours  Basename 12/16/11 1621  WBC 16.3*  NEUTROABS 13.7*  HGB 10.4*  HCT 32.0*  MCV 90.7  PLT 213    Basename 12/16/11 1621  CKTOTAL --  CKMB --  CKMBINDEX --  TROPONINI <0.30   No components found with this basename: POCBNP:3 No results found for this basename: DDIMER:2 in the last 72 hours  Basename 12/16/11 1621  HGBA1C 6.1*    Basename 12/17/11 0605  CHOL 105  HDL 38*  LDLCALC 50  TRIG 86  CHOLHDL 2.8  LDLDIRECT --   No results found for this basename: TSH,T4TOTAL,FREET3,T3FREE,THYROIDAB in the last 72 hours No results found for this basename: VITAMINB12:2,FOLATE:2,FERRITIN:2,TIBC:2,IRON:2,RETICCTPCT:2 in the last 72 hours  Studies/Results: Ct Head Wo Contrast  12/17/2011  *RADIOLOGY REPORT*  Clinical Data: Follow-up for subdural hematoma.  CT HEAD WITHOUT CONTRAST  Technique:  Contiguous axial images were obtained from the base  of the skull through the vertex without contrast.  Comparison: 12/16/2011.  Findings: There is again a small mixed attenuation extra-axial fluid collection overlying the right cerebral convexity (predominately in the frontal region), consistent with a subdural hematoma.  The predominantly low attenuation collection and has some higher attenuation internal areas, suggesting regions of both chronic and subacute hemorrhage. This measures up to 12 mm in thickness.  No evidence of recurrent acute hemorrhage is identified.  This continues to exhibit some mild mass effect upon the adjacent to right cerebral hemisphere, and there is approximately 2 mm of right-to-left midline shift.  There is a background of mild cerebral and cerebellar atrophy.  Well defined foci of low attenuation in the left external capsule are again noted, consistent with old lacunar infarctions.   Likewise there is a well-defined focus of low attenuation within the left side of the pons, likely represents sequela of prior infarction. There is also some patchy and confluent areas of decreased attenuation throughout the deep and periventricular white matter of the cerebral hemispheres bilaterally, most consistent with chronic microvascular ischemic changes. No hydrocephalous.   No acute displaced skull fractures are identified.  Extensive mucoperiosteal thickening is noted throughout the visualized paranasal  sinuses, suggesting chronic sinusitis.  Probable prior bilateral maxillary antrectomies.  IMPRESSION: 1.  Overall, the appearance the brain is very similar to yesterday's examination, again demonstrating a mixed attenuation chronic and subacute right-sided subdural hematoma which measures up to 12 mm in thickness and is associated with 2 mm of right-to- left midline shift. 2.  Cerebral and cerebellar atrophy, old lacunar infarctions and chronic microvascular ischemic changes throughout the cerebral white matter redemonstrated, as above. 3.  Changes  compatible with chronic paranasal sinusitis.  Original Report Authenticated By: Florencia Reasons, M.D.   Ct Head Wo Contrast  12/16/2011  *RADIOLOGY REPORT*  Clinical Data: Left-sided weakness.  Facial droop for 2 days. Gradual worsening.  Syncopal episode today.  CT HEAD WITHOUT CONTRAST  Technique:  Contiguous axial images were obtained from the base of the skull through the vertex without contrast.  Comparison: 01/23/2011.  Findings: Right frontal subdural fluid collection is present which is new compared to the prior exam of 01/23/2011.  There is a tiny amount of high attenuation blood along the inner table of the right frontal bone (image 11 series 2).  Maximal thickness of the subdural fluid collection is 12 mm.  No midline shift.  The focal chronic ischemia is present adjacent to the frontal horn of the right lateral ventricle.  Atrophy and chronic ischemic white matter disease appears similar to prior.  Left sub insular lacunar infarct.  Intracranial atherosclerosis.  Lacunar infarct in the left pons again noted.  Mastoid air cells clear.  Chronic paranasal sinus disease is unchanged.  IMPRESSION: 1.  Mixed attenuation right subdural collection, with small amount of acute or subacute hemorrhage. Findings compatible with acute on chronic subdural hematoma or small subdural hematoma superimposed on subdural hygroma.  Mild mass effect without midline shift. Maximal thickness 12 mm. 2.  Underlying atrophy and chronic ischemic white matter disease without interval change. 3.  Chronic paranasal sinus disease.  Critical Value/emergent results were called by telephone at the time of interpretation on 12/16/2011  at 1703 hours  to  Dahlia Byes, RN, who verbally acknowledged these results.  Original Report Authenticated By: Andreas Newport, M.D.   Dg Chest Portable 1 View  12/16/2011  *RADIOLOGY REPORT*  Clinical Data: Loss of consciousness.  Extremity weakness. Syncope.  PORTABLE CHEST - 1 VIEW  Comparison:  07/12/2011.  Findings: Cardiomegaly.  Median sternotomy.  Cardiac pacemaker / AICD with multiple leads.  No airspace disease.  No effusion. Patient is rotated to the left.  IMPRESSION: Cardiomegaly without failure. Postoperative and postprocedural changes of the chest without acute cardiopulmonary disease.  Original Report Authenticated By: Andreas Newport, M.D.   Medications: Scheduled Meds:   . sodium chloride   Intravenous STAT  . albuterol  2.5 mg Nebulization Q6H  . amLODipine  5 mg Oral Daily  . budesonide  0.5 mg Nebulization BID  . carvedilol  12.5 mg Oral BID WC  . digoxin  125 mcg Oral Daily  . ferrous sulfate  325 mg Oral Q breakfast  . furosemide  40 mg Oral Q2000  . furosemide  80 mg Oral Daily  . insulin aspart  0-5 Units Subcutaneous QHS  . insulin aspart  0-9 Units Subcutaneous TID WC  . irbesartan  300 mg Oral Daily  . metFORMIN  500 mg Oral BID WC  . nitroGLYCERIN  0.2 mg Transdermal Daily  . potassium chloride SA  20 mEq Oral Daily  . predniSONE  10 mg Oral Q breakfast  . simvastatin  20 mg  Oral QHS  . tiotropium  18 mcg Inhalation Daily  . DISCONTD: furosemide  20 mg Oral Daily  . DISCONTD: furosemide  40 mg Oral Q1500   Continuous Infusions:  PRN Meds:.albuterol, albuterol, guaiFENesin, nitroGLYCERIN  Assessment/Plan: Principal Problem:  *Syncope and collapse - no evidence of serious ectopy on telemetry. He feels well. I note plan for echocardiogram. Carotid Doppler showed carotid artery disease that does not need intervention. See comments below. Active Problems:  DYSLIPIDEMIA  HYPERTENSION  CORONARY ARTERY DISEASE  COPD  TIA (transient ischemic attack)  Subdural hematoma - he seems to have a slight acute subdural hematoma on top of a chronic one that occurred last year after a fall. He is clinically stable without evidence of worsening. He is on chronic anticoagulation because of systolic congestive heart failure, and we will hold this for right now. Plan  resumption in a few weeks. Assuming no etiology for syncope as noted on echocardiogram or on further telemetry, plan discharge tomorrow.   LOS: 2 days   Alim Cattell CHARLES 12/18/2011, 7:48 AM

## 2011-12-18 NOTE — Procedures (Signed)
EEG NUMBER:  [id number].  This routine EEG was requested in this 76 year old man who has had a spell of passing out, falling, and hitting his head.  After that he was noted to be weak on his left side.  He was found to have a subacute subdural hematoma.  He is on no anticonvulsant medication.  The EEG was done with the patient awake and drowsy.  During periods of maximal wakefulness, he had a 7-8 cycle per second posterior dominant rhythm that attenuated with eye opening and was symmetric.  Background activities were composed of low amplitude beta activities that were symmetric.  Photic stimulation did not produce a driving response. Hyperventilation was not performed.  The patient spent much of the tracing sleep.  This was characterized by mixed frequency delta and theta activities and that were symmetric. Vertex sharp waves were noted.  CLINICAL INTERPRETATION:  This routine EEG done with the patient awake and drowsy is abnormal.  Background activities are slightly too slow for his age and state suggesting mild encephalopathy of nonspecific etiology.  No interictal epileptiform discharges were seen.          ______________________________ Denton Meek, MD    [id number] D:  12/18/2011 17:38:49  T:  12/18/2011 18:27:42  Job #:  [id number]

## 2011-12-18 NOTE — Progress Notes (Signed)
Stroke Team Progress Note  HISTORY  [name] is an 76 y.o. male a history of PAF, coronary artery disease, COPD, congestive heart failure, ventricular tachycardia with defibrillator implantation and right subdural hematoma, presenting with a history of loss of consciousness at about 3:30 on 12/16/2011 when he stood up from the commode in his bathroom. He apparently struck his head as he fell. He thinks he was unconscious for less than half an hour. He noted left-sided weakness on waking up and was well, able to get up from the floor. He noticed his speech was slurred when he finally got to the telephone to call EMS.   Left-sided weakness began to improve on the way to the emergency room. Patient has been on Coumadin and INR was 1.69. History of PAF. Strength of his left extremities has continued to improve since admission. CT scan of his head showed right subdural hematoma with a slight amount of acute to subacute hemorrhage but primarily chronic subdural hematoma or subdural hygroma noted. Maximum diameter was 12 mm, with a 2 mm right-to-left midline shift. No neurosurgical intervention was deemed necessary per Dr.Cram. History of previous fall.  Patient thinks he had a TIA in the 20s. CT scan also showed scattered lacunar infarctions which were old.   He says that he sees Dr. Katrinka Blazing for heart condition. In addition he says that he bit his tongue when her fell.  He was not a candidate for thrombolytic therapy with TPA because of subdural hematoma and fairly rapidly resolving left hemiparesthesias.  SUBJECTIVE He feels his condition is rapidly improving. He is wanting to go home. He has no complaints today.    OBJECTIVE Most recent Vital Signs: Filed Vitals:   12/17/11 1921 12/17/11 2055 12/18/11 0147 12/18/11 0600  BP:  141/58 140/44 151/65  Pulse: 62 60 61 62  Temp:  98.3 F (36.8 C) 98.1 F (36.7 C) 98.6 F (37 C)  TempSrc:  Oral Oral Oral  Resp: 18 18 18 18   Height:        Weight:      SpO2:  99% 99% 98%   CBG (last 3)   Basename 12/18/11 0655 12/17/11 2051 12/17/11 1612  GLUCAP 102* 144* 127*   Intake/Output from previous day: 06/16 0701 - 06/17 0700 In: 720 [P.O.:720] Out: 1750 [Urine:1750]  IV Fluid Intake:     MEDICATIONS    . sodium chloride   Intravenous STAT  . albuterol  2.5 mg Nebulization Q6H  . amLODipine  5 mg Oral Daily  . budesonide  0.5 mg Nebulization BID  . carvedilol  12.5 mg Oral BID WC  . digoxin  125 mcg Oral Daily  . ferrous sulfate  325 mg Oral Q breakfast  . furosemide  40 mg Oral Q2000  . furosemide  80 mg Oral Daily  . insulin aspart  0-5 Units Subcutaneous QHS  . insulin aspart  0-9 Units Subcutaneous TID WC  . irbesartan  300 mg Oral Daily  . metFORMIN  500 mg Oral BID WC  . nitroGLYCERIN  0.2 mg Transdermal Daily  . potassium chloride SA  20 mEq Oral Daily  . predniSONE  10 mg Oral Q breakfast  . simvastatin  20 mg Oral QHS  . tiotropium  18 mcg Inhalation Daily  . DISCONTD: furosemide  20 mg Oral Daily  . DISCONTD: furosemide  40 mg Oral Q1500   PRN:  albuterol, albuterol, guaiFENesin, nitroGLYCERIN  Diet:  Carb Control Activity: Bathroom privileges DVT Prophylaxis:  CLINICALLY SIGNIFICANT STUDIES Basic Metabolic Panel:  Lab 12/16/11 1610  NA 139  K 4.4  CL 98  CO2 32  GLUCOSE 96  BUN 18  CREATININE 1.03  CALCIUM 9.4  MG --  PHOS --   Liver Function Tests:  Lab 12/16/11 1621  AST 17  ALT 11  ALKPHOS 75  BILITOT 0.4  PROT 7.3  ALBUMIN 3.6   CBC:  Lab 12/16/11 1621  WBC 16.3*  NEUTROABS 13.7*  HGB 10.4*  HCT 32.0*  MCV 90.7  PLT 213   Coagulation:  Lab 12/17/11 1130  LABPROT 20.2*  INR 1.69*   Cardiac Enzymes:  Lab 12/16/11 1621  CKTOTAL --  CKMB --  CKMBINDEX --  TROPONINI <0.30   Urinalysis:  Lab 12/16/11 1734  COLORURINE --  LABSPEC 1.013  PHURINE 6.0  GLUCOSEU NEGATIVE  HGBUR NEGATIVE  BILIRUBINUR NEGATIVE  KETONESUR NEGATIVE  PROTEINUR 30*  UROBILINOGEN  0.2  NITRITE NEGATIVE  LEUKOCYTESUR TRACE*   Lipid Panel    Component Value Date/Time   CHOL 105 12/17/2011 0605   TRIG 86 12/17/2011 0605   HDL 38* 12/17/2011 0605   CHOLHDL 2.8 12/17/2011 0605   VLDL 17 12/17/2011 0605   LDLCALC 50 12/17/2011 0605   HgbA1C  Lab Results  Component Value Date   HGBA1C 6.1* 12/16/2011    Urine Drug Screen:     Component Value Date/Time   LABOPIA NONE DETECTED 12/16/2011 2114   COCAINSCRNUR NONE DETECTED 12/16/2011 2114   LABBENZ NONE DETECTED 12/16/2011 2114   AMPHETMU NONE DETECTED 12/16/2011 2114   THCU NONE DETECTED 12/16/2011 2114   LABBARB NONE DETECTED 12/16/2011 2114    Alcohol Level: No results found for this basename: ETH:2 in the last 168 hours  Ct Head Wo Contrast  12/17/2011 chronic and subacute right-sided subdural hematoma which measures up to 12 mm in thickness and is associated with 2 mm of right-to- left midline shift. Cerebral and cerebellar atrophy, old lacunar infarctions and chronic microvascular ischemic changes throughout the cerebral white matter   Ct Head Wo Contrast  12/16/2011   Lacunar infarct in the left pons again noted.  Mixed attenuation right subdural collection, with small amount of acute or subacute hemorrhage. Findings compatible with acute on chronic subdural hematoma or small subdural hematoma superimposed on subdural   Dg Chest Portable 1 View  12/16/2011 : Cardiomegaly without failure  CTA Head/Neck: ordered  2D Echocardiogram The cavity size was mildly dilated. There was moderate concentric hypertrophy. Systolic function was mildly to moderately reduced. The estimated ejection fraction was in the range of 40% to 45%.Left atrium: The atrium was moderately to severely dilated.Pulmonary arteries: Systolic pressure was mildly tomoderately increased. PA peak pressure: 41mm Hg (S).  Carotid Doppler  Carotid Duplex (Doppler) has been completed. Preliminary findings: Right= 60-79% ICA stenosis, mid end of scale. Left=  40-59% ICA stenosis, mid end of scale. Bilateral antegrade vertebral flow. EKG AV Pacing.   Therapy Recommendations PT---, OT ---  Physical Exam  --- Pleasant middle aged Caucasian male not in distress.Awake alert. Afebrile. Head is nontraumatic. Neck is supple without bruit. Hearing is normal. Cardiac exam no murmur or gallop. Lungs are clear to auscultation. Distal pulses are well felt.  Neurological Exam :Awake alert oriented x 3 normal speech and language.Extraocular movements are full range. Visual acuity and fields are normal. Fundi were not visualized. Mild left lower face asymmetry. Tongue midline. No drift. Mild diminished fine finger movements on left. Orbits right over left upper extremity. Mild  left grip weak.. Normal sensation . Normal coordination. Gait deferred. Deep tendon reflexes are symmetric.Plantars are downgoing. ASSESSMENT [name] is a 76 y.o. male with a right subdural hematoma felt  secondary to fall.  On warfarin prior to admission. Now on no antidcoagulants due to subdural hematoma for secondary stroke prevention.  -CTA Head and Neck -PAF, on coumadin -PVD -Old lacunar infarct -HTN -Diabetes Mellitus -Bilateral ICA stenosis  Hospital day # 2  TREATMENT/PLAN  -continue to hold anticoagulants.  -fall risk -CTA head and neck -cardiology consult to assist with anticoagulation options in this patient -aggressive management of stroke risk factors. -SCDs   Guy Franco, [facility],  MBA, MHA [facility] Pager: [phone] 12/18/2011 9:50 AM  Scribe for Dr. Delia Heady, Stroke Center Medical Director. He has personally reviewed chart, pertinent data, examined the patient and developed the plan of care. Pager:  [phone]

## 2011-12-18 NOTE — Progress Notes (Signed)
Dr. Pearlean Brownie has reviewed CT angio of head and neck. Bilateral string signs are not congruent with carotid doppler results (R 60-79% and L 40-59%). Given large amts of calcium seen, which obstruct accurate view, feel carotid dopplers are most consistent with actual flow. Would not recommend VVS consult at this time as patient not a surgical candidate due to recent traumatic SDH. Patient also not a stent candidate as stents require ASA and Plavix together x 1 year.  Joaquin Music, ANP-BC, GNP-BC [facility] Pager: [phone] 12/18/2011 3:46 PM  For Dr. Delia Heady, Stroke Center Medical Director. He has personally reviewed chart, pertinent data, examined the patient and developed the plan of care. Pager:  [phone]

## 2011-12-18 NOTE — Progress Notes (Signed)
Subjective: Patient reports He still feeling great no other episodes since admission emanating voiding he really wants to go home  Objective: Vital signs in last 24 hours: Temp:  [98.1 F (36.7 C)-98.8 F (37.1 C)] 98.5 F (36.9 C) (06/17 0937) Pulse Rate:  [60-65] 64  (06/17 0937) Resp:  [18-20] 20  (06/17 0937) BP: (118-151)/(44-69) 118/49 mmHg (06/17 0937) SpO2:  [95 %-99 %] 97 % (06/17 0937) FiO2 (%):  [32 %] 32 % (06/16 1522)  Intake/Output from previous day: 06/16 0701 - 06/17 0700 In: 720 [P.O.:720] Out: 1750 [Urine:1750] Intake/Output this shift: Total I/O In: 240 [P.O.:240] Out: -   Is awake alert oriented strength is 5 out of 5 very slight left pronator drift  Lab Results:  Basename 12/16/11 1621  WBC 16.3*  HGB 10.4*  HCT 32.0*  PLT 213   BMET  Basename 12/16/11 1621  NA 139  K 4.4  CL 98  CO2 32  GLUCOSE 96  BUN 18  CREATININE 1.03  CALCIUM 9.4    Studies/Results: Ct Head Wo Contrast  12/17/2011  *RADIOLOGY REPORT*  Clinical Data: Follow-up for subdural hematoma.  CT HEAD WITHOUT CONTRAST  Technique:  Contiguous axial images were obtained from the base of the skull through the vertex without contrast.  Comparison: 12/16/2011.  Findings: There is again a small mixed attenuation extra-axial fluid collection overlying the right cerebral convexity (predominately in the frontal region), consistent with a subdural hematoma.  The predominantly low attenuation collection and has some higher attenuation internal areas, suggesting regions of both chronic and subacute hemorrhage. This measures up to 12 mm in thickness.  No evidence of recurrent acute hemorrhage is identified.  This continues to exhibit some mild mass effect upon the adjacent to right cerebral hemisphere, and there is approximately 2 mm of right-to-left midline shift.  There is a background of mild cerebral and cerebellar atrophy.  Well defined foci of low attenuation in the left external capsule are  again noted, consistent with old lacunar infarctions.   Likewise there is a well-defined focus of low attenuation within the left side of the pons, likely represents sequela of prior infarction. There is also some patchy and confluent areas of decreased attenuation throughout the deep and periventricular white matter of the cerebral hemispheres bilaterally, most consistent with chronic microvascular ischemic changes. No hydrocephalous.   No acute displaced skull fractures are identified.  Extensive mucoperiosteal thickening is noted throughout the visualized paranasal sinuses, suggesting chronic sinusitis.  Probable prior bilateral maxillary antrectomies.  IMPRESSION: 1.  Overall, the appearance the brain is very similar to yesterday's examination, again demonstrating a mixed attenuation chronic and subacute right-sided subdural hematoma which measures up to 12 mm in thickness and is associated with 2 mm of right-to- left midline shift. 2.  Cerebral and cerebellar atrophy, old lacunar infarctions and chronic microvascular ischemic changes throughout the cerebral white matter redemonstrated, as above. 3.  Changes compatible with chronic paranasal sinusitis.  Original Report Authenticated By: Florencia Reasons, M.D.   Ct Head Wo Contrast  12/16/2011  *RADIOLOGY REPORT*  Clinical Data: Left-sided weakness.  Facial droop for 2 days. Gradual worsening.  Syncopal episode today.  CT HEAD WITHOUT CONTRAST  Technique:  Contiguous axial images were obtained from the base of the skull through the vertex without contrast.  Comparison: 01/23/2011.  Findings: Right frontal subdural fluid collection is present which is new compared to the prior exam of 01/23/2011.  There is a tiny amount of high attenuation blood along the  inner table of the right frontal bone (image 11 series 2).  Maximal thickness of the subdural fluid collection is 12 mm.  No midline shift.  The focal chronic ischemia is present adjacent to the frontal horn  of the right lateral ventricle.  Atrophy and chronic ischemic white matter disease appears similar to prior.  Left sub insular lacunar infarct.  Intracranial atherosclerosis.  Lacunar infarct in the left pons again noted.  Mastoid air cells clear.  Chronic paranasal sinus disease is unchanged.  IMPRESSION: 1.  Mixed attenuation right subdural collection, with small amount of acute or subacute hemorrhage. Findings compatible with acute on chronic subdural hematoma or small subdural hematoma superimposed on subdural hygroma.  Mild mass effect without midline shift. Maximal thickness 12 mm. 2.  Underlying atrophy and chronic ischemic white matter disease without interval change. 3.  Chronic paranasal sinus disease.  Critical Value/emergent results were called by telephone at the time of interpretation on 12/16/2011  at 1703 hours  to  Dahlia Byes, RN, who verbally acknowledged these results.  Original Report Authenticated By: Andreas Newport, M.D.   Dg Chest Portable 1 View  12/16/2011  *RADIOLOGY REPORT*  Clinical Data: Loss of consciousness.  Extremity weakness. Syncope.  PORTABLE CHEST - 1 VIEW  Comparison: 07/12/2011.  Findings: Cardiomegaly.  Median sternotomy.  Cardiac pacemaker / AICD with multiple leads.  No airspace disease.  No effusion. Patient is rotated to the left.  IMPRESSION: Cardiomegaly without failure. Postoperative and postprocedural changes of the chest without acute cardiopulmonary disease.  Original Report Authenticated By: Andreas Newport, M.D.    Assessment/Plan: Continue workup rule out TIA possible EEG for seizure activity patient apparently has been seen by the stroke service. No neurosurgical mention intervention at this time if the patient is cleared medically he can be discharged followup in one to 2 weeks for a followup head CT.    Mc Hollen P 12/18/2011, 10:14 AM

## 2011-12-19 DIAGNOSIS — I6340 Cerebral infarction due to embolism of unspecified cerebral artery: Secondary | ICD-10-CM

## 2011-12-19 DIAGNOSIS — R55 Syncope and collapse: Secondary | ICD-10-CM

## 2011-12-19 LAB — GLUCOSE, CAPILLARY

## 2011-12-19 MED ORDER — ASPIRIN EC 325 MG PO TBEC: 325.0000 mg | DELAYED_RELEASE_TABLET | Freq: Every day | ORAL | Status: AC

## 2011-12-19 NOTE — Progress Notes (Signed)
Pt did not have bed alarm on all day 6/17. Nurse given report that pt was independent, yet no sign off sheet was placed on door. Pt refused to have bed alarm placed again. NT stayed by pt's room in case he tried to get up with out calling. No incident occurred in the night. Day shift given report regarding pt's refusal of bed alarm.  Salvadore Oxford, RN

## 2011-12-19 NOTE — Discharge Summary (Signed)
Physician Discharge Summary  NAME:[name]  JXB:[id number]  DOB: Jan 07, 1936   Admit date: 12/16/2011 Discharge date: 12/19/2011  Admitting Diagnosis: Syncope.  Discharge Diagnoses:  Principal Problem:  *Syncope and collapse Active Problems:   Subdural hematoma  DYSLIPIDEMIA  HYPERTENSION  CORONARY ARTERY DISEASE  COPD  TIA (transient ischemic attack)    Discharge Condition: Improved.  Hospital Course: The patient was admitted after a syncopal episode and after CT scanning showed an acute on chronic subdural hematoma. There was concern that he may have had a stroke as the underlying cause for his syncope. He may not have an MRI due to his implantable defibrillator. Therefore a CTA of the head was done. This did not show any evidence of acute stroke. There was concern that he had a severe stenosis of his carotid arteries, but a Doppler exam of the carotid arteries revealed moderate disease with the right side being 60-80% obstructed in the left side being 50-60% obstructed.  Because of evidence of chronic subdural hematoma with superimposed acute subdural hematoma, it was decided that the patient is no longer candidate for chronic anticoagulation. Therefore, at this time, Coumadin is being discontinued. Due to severe vascular disease aspirin will be started 325 mg daily.  Consults: Neurology  Disposition: 01-Home or Self Care  @DISCHARGEORDER @ Medication List  As of 12/19/2011  7:30 AM   STOP taking these medications         warfarin 2.5 MG tablet         TAKE these medications         Acapella Misc   Use flutter valve as directed      albuterol (2.5 MG/3ML) 0.083% nebulizer solution   Commonly known as: PROVENTIL   Take 2.5 mg by nebulization 4 (four) times daily.      albuterol 108 (90 BASE) MCG/ACT inhaler   Commonly known as: PROVENTIL HFA;VENTOLIN HFA   Inhale 1-2 puffs into the lungs every 6 (six) hours as needed. Shortness of breath      amLODipine 5 MG  tablet   Commonly known as: NORVASC   Take 5 mg by mouth daily.      aspirin EC 325 MG tablet   Take 1 tablet (325 mg total) by mouth daily.      budesonide 0.5 MG/2ML nebulizer solution   Commonly known as: PULMICORT   Take 0.5 mg by nebulization 2 (two) times daily.      candesartan 32 MG tablet   Commonly known as: ATACAND   Take 32 mg by mouth daily.      carvedilol 12.5 MG tablet   Commonly known as: COREG   Take 12.5 mg by mouth 2 (two) times daily with a meal.      digoxin 0.125 MG tablet   Commonly known as: LANOXIN   Take 125 mcg by mouth daily.      ferrous sulfate 325 (65 FE) MG tablet   Take 325 mg by mouth daily with breakfast.      furosemide 40 MG tablet   Commonly known as: LASIX   Take 2 tabs by mouth in the morning and 1 tab in the evening      guaiFENesin 600 MG 12 hr tablet   Commonly known as: MUCINEX   Take 600 mg by mouth 2 (two) times daily as needed. congestion      HYDROcodone-acetaminophen 2.5-500 MG per tablet   Commonly known as: VICODIN   Take 1 tablet by mouth every 6 (six) hours  as needed. Back pain      metFORMIN 500 MG tablet   Commonly known as: GLUCOPHAGE   Take 1 tablet (500 mg total) by mouth 2 (two) times daily with a meal.      nitroGLYCERIN 0.4 MG SL tablet   Commonly known as: NITROSTAT   Place 0.4 mg under the tongue every 5 (five) minutes as needed. May repeat x3  For chest pain      nitroGLYCERIN 0.2 mg/hr   Commonly known as: NITRODUR - Dosed in mg/24 hr   Place 1 patch onto the skin daily.      potassium chloride SA 20 MEQ tablet   Commonly known as: K-DUR,KLOR-CON   Take 1 tablet (20 mEq total) by mouth daily.      predniSONE 10 MG tablet   Commonly known as: DELTASONE   Take 1 tablet (10 mg total) by mouth daily.      simvastatin 20 MG tablet   Commonly known as: ZOCOR   Take 20 mg by mouth at bedtime.      tiotropium 18 MCG inhalation capsule   Commonly known as: SPIRIVA   Place 18 mcg into inhaler and  inhale daily.           Follow-up Information    Follow up with Darnelle Bos, MD. (we will call you to make an appointment in the next two weeks)    Contact information:   [address] [place], [place]. [facility] [place] [address] [phone]          Things to follow up in the outpatient setting: I will report to Dr. Katrinka Blazing, his cardiologist, that Coumadin has been discontinued. We will plan to follow his carotid Dopplers in a serial fashion over time.   Time coordinating discharge:  25 minutes including medication reconciliation, transmission of prescriptions to pharmacy, preparation of discharge papers, and discussion with patient.    SignedDarnelle Bos 12/19/2011, 7:30 AM

## 2011-12-19 NOTE — Discharge Instructions (Signed)
STROKE/TIA DISCHARGE INSTRUCTIONS SMOKING Cigarette smoking nearly doubles your risk of having a stroke & is the single most alterable risk factor  If you smoke or have smoked in the last 12 months, you are advised to quit smoking for your health.  Most of the excess cardiovascular risk related to smoking disappears within a year of stopping.  Ask you doctor about anti-smoking medications  [place] Quit Line: 1-800-QUIT NOW  Free Smoking Cessation Classes [phone]  CHOLESTEROL Know your levels; limit fat & cholesterol in your diet  Lipid Panel     Component Value Date/Time   CHOL 105 12/17/2011 0605   TRIG 86 12/17/2011 0605   HDL 38* 12/17/2011 0605   CHOLHDL 2.8 12/17/2011 0605   VLDL 17 12/17/2011 0605   LDLCALC 50 12/17/2011 0605      Many patients benefit from treatment even if their cholesterol is at goal.  Goal: Total Cholesterol (CHOL) less than 160  Goal:  Triglycerides (TRIG) less than 150  Goal:  HDL greater than 40  Goal:  LDL (LDLCALC) less than 100   BLOOD PRESSURE American Stroke Association blood pressure target is less that 120/80 mm/Hg  Your discharge blood pressure is:  BP: 140/44 mmHg  Monitor your blood pressure  Limit your salt and alcohol intake  Many individuals will require more than one medication for high blood pressure  DIABETES (A1c is a blood sugar average for last 3 months) Goal HGBA1c is under 7% (HBGA1c is blood sugar average for last 3 months)  Diabetes: {STROKE DC DIABETES:22357}    Lab Results  Component Value Date   HGBA1C 6.1* 12/16/2011     Your HGBA1c can be lowered with medications, healthy diet, and exercise.  Check your blood sugar as directed by your physician  Call your physician if you experience unexplained or low blood sugars.  PHYSICAL ACTIVITY/REHABILITATION Goal is 30 minutes at least 4 days per week    {STROKE DC ACTIVITY/REHAB:22359}  Activity decreases your risk of heart attack and stroke and makes your heart  stronger.  It helps control your weight and blood pressure; helps you relax and can improve your mood.  Participate in a regular exercise program.  Talk with your doctor about the best form of exercise for you (dancing, walking, swimming, cycling).  DIET/WEIGHT Goal is to maintain a healthy weight  Your discharge diet is: Carb Control *** liquids Your height is:  Height: 5\' 7"  (170.2 cm) Your current weight is: Weight: 79.379 kg (175 lb) Your Body Mass Index (BMI) is:  BMI (Calculated): 27.5   Following the type of diet specifically designed for you will help prevent another stroke.  Your goal weight range is:  ***  Your goal Body Mass Index (BMI) is 19-24.  Healthy food habits can help reduce 3 risk factors for stroke:  High cholesterol, hypertension, and excess weight.  RESOURCES Stroke/Support Group:  Call [phone]  they meet the 3rd Sunday of the month on the Rehab Unit at [facility], [place] ( no meetings June, July & Aug).  STROKE EDUCATION PROVIDED/REVIEWED AND GIVEN TO PATIENT Stroke warning signs and symptoms How to activate emergency medical system (call 911). Medications prescribed at discharge. Need for follow-up after discharge. Personal risk factors for stroke. Pneumonia vaccine given:   {STROKE DC YES/NO/DATE:22363} Flu vaccine given:   {STROKE DC YES/NO/DATE:22363} My questions have been answered, the writing is legible, and I understand these instructions.  I will adhere to these goals & educational materials that have been  provided to me after my discharge from the hospital.

## 2011-12-19 NOTE — Care Management Note (Signed)
    Page 1 of 1   12/19/2011     2:22:41 PM   CARE MANAGEMENT NOTE 12/19/2011  Patient:  [name], [name]   Account Number:  [id number]  Date Initiated:  12/18/2011  Documentation initiated by:  [facility]  Subjective/Objective Assessment:   Admitted with syncope, subdural hematoma. Lives with spouse.     Action/Plan:   Anticipated DC Date:  12/19/2011   Anticipated DC Plan:  HOME/SELF CARE      DC Planning Services  CM consult      Choice offered to / List presented to:             Status of service:  Completed, signed off Medicare Important Message given?   (If response is "NO", the following Medicare IM given date fields will be blank) Date Medicare IM given:   Date Additional Medicare IM given:    Discharge Disposition:  HOME/SELF CARE  Per UR Regulation:  Reviewed for med. necessity/level of care/duration of stay  If discussed at Long Length of Stay Meetings, dates discussed:    Comments:

## 2011-12-19 NOTE — Progress Notes (Signed)
Stroke Team Progress Note  HISTORY  [name] is an 76 y.o. male a history of PAF, coronary artery disease, COPD, congestive heart failure, ventricular tachycardia with defibrillator implantation and right subdural hematoma, presenting with a history of loss of consciousness at about 3:30 on 12/16/2011 when he stood up from the commode in his bathroom. He apparently struck his head as he fell. He thinks he was unconscious for less than half an hour. He noted left-sided weakness on waking up and was well, able to get up from the floor. He noticed his speech was slurred when he finally got to the telephone to call EMS.   Left-sided weakness began to improve on the way to the emergency room. Patient has been on Coumadin and INR was 1.69. History of PAF. Strength of his left extremities has continued to improve since admission. CT scan of his head showed right subdural hematoma with a slight amount of acute to subacute hemorrhage but primarily chronic subdural hematoma or subdural hygroma noted. Maximum diameter was 12 mm, with a 2 mm right-to-left midline shift. No neurosurgical intervention was deemed necessary per Dr.Cram. History of previous fall.  Patient thinks he had a TIA in the 17s. CT scan also showed scattered lacunar infarctions which were old.   He says that he sees Dr. Katrinka Blazing for heart condition. In addition he says that he bit his tongue when her fell.  He was not a candidate for thrombolytic therapy with TPA because of subdural hematoma and fairly rapidly resolving left hemiparesthesias.  SUBJECTIVE He feels like he is ready to go home. He is for discharge today. He is wanting to go home. He has no neurological complaints today.  OBJECTIVE Most recent Vital Signs: Filed Vitals:   12/19/11 0200 12/19/11 0303 12/19/11 0615 12/19/11 0807  BP: 151/56  136/66 131/55  Pulse: 57  75 58  Temp: 97.9 F (36.6 C)  98 F (36.7 C)   TempSrc: Oral  Oral   Resp: 18  18   Height:        Weight:      SpO2: 97% 97% 99%    CBG (last 3)   Basename 12/19/11 0703 12/18/11 2208 12/18/11 1623  GLUCAP 92 90 184*   Intake/Output from previous day: 06/17 0701 - 06/18 0700 In: 720 [P.O.:720] Out: 1745 [Urine:1745]  IV Fluid Intake:     MEDICATIONS     . albuterol  2.5 mg Nebulization Q6H  . amLODipine  5 mg Oral Daily  . budesonide  0.5 mg Nebulization BID  . carvedilol  12.5 mg Oral BID WC  . digoxin  125 mcg Oral Daily  . ferrous sulfate  325 mg Oral Q breakfast  . furosemide  40 mg Oral Q2000  . furosemide  80 mg Oral Daily  . insulin aspart  0-5 Units Subcutaneous QHS  . insulin aspart  0-9 Units Subcutaneous TID WC  . irbesartan  300 mg Oral Daily  . metFORMIN  500 mg Oral BID WC  . nitroGLYCERIN  0.2 mg Transdermal Daily  . potassium chloride SA  20 mEq Oral Daily  . predniSONE  10 mg Oral Q breakfast  . simvastatin  20 mg Oral QHS  . tiotropium  18 mcg Inhalation Daily   PRN:  albuterol, albuterol, guaiFENesin, iohexol, nitroGLYCERIN  Diet:  Carb Control Activity: Bathroom privileges    CLINICALLY SIGNIFICANT STUDIES Basic Metabolic Panel:   Lab 12/16/11 1621  NA 139  K 4.4  CL 98  CO2 32  GLUCOSE 96  BUN 18  CREATININE 1.03  CALCIUM 9.4  MG --  PHOS --   Liver Function Tests:   Lab 12/16/11 1621  AST 17  ALT 11  ALKPHOS 75  BILITOT 0.4  PROT 7.3  ALBUMIN 3.6   CBC:   Lab 12/16/11 1621  WBC 16.3*  NEUTROABS 13.7*  HGB 10.4*  HCT 32.0*  MCV 90.7  PLT 213   Coagulation:   Lab 12/17/11 1130  LABPROT 20.2*  INR 1.69*   Cardiac Enzymes:   Lab 12/16/11 1621  CKTOTAL --  CKMB --  CKMBINDEX --  TROPONINI <0.30   Urinalysis:   Lab 12/16/11 1734  COLORURINE --  LABSPEC 1.013  PHURINE 6.0  GLUCOSEU NEGATIVE  HGBUR NEGATIVE  BILIRUBINUR NEGATIVE  KETONESUR NEGATIVE  PROTEINUR 30*  UROBILINOGEN 0.2  NITRITE NEGATIVE  LEUKOCYTESUR TRACE*   Lipid Panel    Component Value Date/Time   CHOL 105 12/17/2011 0605    TRIG 86 12/17/2011 0605   HDL 38* 12/17/2011 0605   CHOLHDL 2.8 12/17/2011 0605   VLDL 17 12/17/2011 0605   LDLCALC 50 12/17/2011 0605   HgbA1C  Lab Results  Component Value Date   HGBA1C 6.1* 12/16/2011    Urine Drug Screen:     Component Value Date/Time   LABOPIA NONE DETECTED 12/16/2011 2114   COCAINSCRNUR NONE DETECTED 12/16/2011 2114   LABBENZ NONE DETECTED 12/16/2011 2114   AMPHETMU NONE DETECTED 12/16/2011 2114   THCU NONE DETECTED 12/16/2011 2114   LABBARB NONE DETECTED 12/16/2011 2114    Alcohol Level: No results found for this basename: ETH:2 in the last 168 hours  Ct Head Wo Contrast  12/17/2011 chronic and subacute right-sided subdural hematoma which measures up to 12 mm in thickness and is associated with 2 mm of right-to- left midline shift. Cerebral and cerebellar atrophy, old lacunar infarctions and chronic microvascular ischemic changes throughout the cerebral white matter   Ct Head Wo Contrast  12/16/2011   Lacunar infarct in the left pons again noted.  Mixed attenuation right subdural collection, with small amount of acute or subacute hemorrhage. Findings compatible with acute on chronic subdural hematoma or small subdural hematoma superimposed on subdural   Dg Chest Portable 1 View  12/16/2011 : Cardiomegaly without failure  CTA Head/Neck: Bilateral ICA radiographic string signs; high-grade bilateral cervical ICA stenosis due to bulky calcified atherosclerotic  plaque.  2D Echocardiogram The cavity size was mildly dilated. There was moderate concentric hypertrophy. Systolic function was mildly to moderately reduced. The estimated ejection fraction was in the range of 40% to 45%.Left atrium: The atrium was moderately to severely dilated.Pulmonary arteries: Systolic pressure was mildly tomoderately increased. PA peak pressure: 41mm Hg (S).  Carotid Doppler  Carotid Duplex (Doppler) has been completed. Preliminary findings: Right= 60-79% ICA stenosis, mid end of scale.  Left= 40-59% ICA stenosis, mid end of scale. Bilateral antegrade vertebral flow.   EKG AV Pacing.   Physical Exam  --- Pleasant middle aged Caucasian male not in distress.Awake alert. Afebrile. Head is nontraumatic. Neck is supple without bruit. Hearing is normal. Cardiac exam no murmur or gallop. Lungs are clear to auscultation. Distal pulses are well felt.  Neurological Exam :Awake alert oriented x 3 normal speech and language.Extraocular movements are full range. Visual acuity and fields are normal. Fundi were not visualized. Mild left lower face asymmetry. Tongue midline. No drift. Mild diminished fine finger movements on left. Orbits right over left upper extremity. Mild left grip weak.Marland Kitchen  Normal sensation . Normal coordination. Gait deferred. Deep tendon reflexes are symmetric.Plantars are downgoing. ASSESSMENT [name] is a 76 y.o. male with a right subdural hematoma felt  secondary to fall.  On warfarin prior to admission. Now on no antidcoagulants due to subdural hematoma for secondary stroke prevention.  -CTA Head and Neck revealed concern for string sign bilaterally, heavy calcifications noted and with review Dr. Pearlean Brownie felt that the carotid dopplers are most consistent with actual flow. Anyway patient not a surgical or stent candidate at present time due to recent traumatic SDH.Needs close f/u of carotids as outpatient -PAF, on coumadin PTA -PVD -Old lacunar infarct -HTN -Diabetes Mellitus -Bilateral ICA stenosis  Hospital day # 3  TREATMENT/PLAN  -continue to hold anticoagulants due to fall risk -aggressive management of stroke risk factors. -Stroke Service will sign off. Follow up with Dr. Pearlean Brownie in 2 months to address carotid revascularization.  Guy Franco, [facility],  MBA, MHA [facility] Pager: [phone] 12/19/2011 9:23 AM  Scribe for Dr. Delia Heady, Stroke Center Medical Director. He has personally reviewed chart, pertinent data, examined the patient  and developed the plan of care. Pager:  [phone]

## 2012-01-26 ENCOUNTER — Ambulatory Visit: Payer: Medicare Other | Admitting: Pulmonary Disease

## 2012-01-30 ENCOUNTER — Ambulatory Visit (INDEPENDENT_AMBULATORY_CARE_PROVIDER_SITE_OTHER): Payer: Medicare Other | Admitting: Pulmonary Disease

## 2012-01-30 ENCOUNTER — Encounter: Payer: Self-pay | Admitting: Pulmonary Disease

## 2012-01-30 VITALS — BP 110/66 | HR 67 | Temp 98.1°F | Ht 67.0 in | Wt 174.0 lb

## 2012-01-30 DIAGNOSIS — J449 Chronic obstructive pulmonary disease, unspecified: Secondary | ICD-10-CM

## 2012-01-30 NOTE — Progress Notes (Signed)
  Subjective:    Patient ID: [name], male    DOB: 06-21-1936, 76 y.o.   MRN: [id number]  HPI The patient comes in today for followup of his known severe emphysema.  Overall, he is maintaining a reasonable baseline, and has not had an acute exacerbation since last visit.  He does have some mild chest congestion with occasional purulent mucus, but does not feel that he is developing a bronchitis at this time.  He feels his exercise tolerance is stable on his current medications.   Review of Systems  Constitutional: Negative for fever and unexpected weight change.  HENT: Positive for rhinorrhea. Negative for ear pain, nosebleeds, congestion, sore throat, sneezing, trouble swallowing, dental problem, postnasal drip and sinus pressure.   Eyes: Negative for redness and itching.  Respiratory: Positive for cough and shortness of breath. Negative for chest tightness and wheezing.   Cardiovascular: Negative for palpitations and leg swelling.  Gastrointestinal: Negative for nausea and vomiting.  Genitourinary: Negative for dysuria.  Musculoskeletal: Negative for joint swelling.  Skin: Negative for rash.  Neurological: Negative for headaches.  Hematological: Bruises/bleeds easily.  Psychiatric/Behavioral: Negative for dysphoric mood. The patient is not nervous/anxious.   All other systems reviewed and are negative.       Objective:   Physical Exam Well-developed male in no acute distress Nose without purulence or discharge noted Chest with decreased breath sounds throughout, a few basilar crackles, no wheezing Cardiac exam with a regular rate and rhythm, controlled ventricular response. Lower extremities with mild pedal edema, no cyanosis Alert and oriented, moves all 4 extremities.       Assessment & Plan:

## 2012-01-30 NOTE — Patient Instructions (Addendum)
No change in breathing medications Stay as active as possible. followup with me in 4mos.

## 2012-01-30 NOTE — Assessment & Plan Note (Signed)
The patient is stable overall from a pulmonary standpoint.  I have asked him to continue with his current bronchodilator regimen, as well as his oxygen.  I have also encouraged him to stay as active as possible.

## 2012-02-09 ENCOUNTER — Encounter: Payer: Self-pay | Admitting: Surgery

## 2012-02-12 ENCOUNTER — Other Ambulatory Visit (INDEPENDENT_AMBULATORY_CARE_PROVIDER_SITE_OTHER): Payer: Medicare Other | Admitting: *Deleted

## 2012-02-12 ENCOUNTER — Other Ambulatory Visit: Payer: Self-pay

## 2012-02-12 ENCOUNTER — Ambulatory Visit (INDEPENDENT_AMBULATORY_CARE_PROVIDER_SITE_OTHER): Payer: Medicare Other | Admitting: Surgery

## 2012-02-12 ENCOUNTER — Encounter: Payer: Self-pay | Admitting: Surgery

## 2012-02-12 ENCOUNTER — Encounter (INDEPENDENT_AMBULATORY_CARE_PROVIDER_SITE_OTHER): Payer: Medicare Other | Admitting: *Deleted

## 2012-02-12 VITALS — BP 131/46 | HR 86 | Resp 16 | Ht 67.0 in | Wt 173.0 lb

## 2012-02-12 DIAGNOSIS — Z48812 Encounter for surgical aftercare following surgery on the circulatory system: Secondary | ICD-10-CM

## 2012-02-12 DIAGNOSIS — I739 Peripheral vascular disease, unspecified: Secondary | ICD-10-CM

## 2012-02-12 DIAGNOSIS — I70219 Atherosclerosis of native arteries of extremities with intermittent claudication, unspecified extremity: Secondary | ICD-10-CM

## 2012-02-12 NOTE — Progress Notes (Signed)
[facility] [place]   Patient name: [name] MRN: [id number] DOB: 05/03/1936 Sex: male     Chief Complaint  Patient presents with  . PVD    6 month f/u bilateral iliac stents    HISTORY OF PRESENT ILLNESS: Patient is back today for followup. He is status post left common iliac stent in October of 2009 4 claudication. He also underwent stenting of his right external iliac artery in February of 2012 and also re\re intervention of the left common iliac artery at that time. He remained asymptomatic and is here today for his surveillance ultrasound. He has recently stopped his Coumadin secondary to a fall associated with a head bleed. He is on a full aspirin.  Past Medical History  Diagnosis Date  . Hypokalemia   . Colon cancer   . PVD (peripheral vascular disease)   . HTN (hypertension)   . Automatic implantable cardiac defibrillator in situ   . CHF (congestive heart failure)   . Ventricular tachycardia   . Ischemic heart disease, chronic   . Dyslipidemia   . CAD (coronary artery disease)   . COPD (chronic obstructive pulmonary disease)   . Emphysema   . On home oxygen therapy   . Difficult intubation   . Stroke     Past Surgical History  Procedure Date  . Lumbar back surgery     x2  . Cholecystectomy   . Iliac artery stent     left  . Implantation of medtronic concerto bi-v icd 04-10-08    Gregg Taylor MD  . Colonoscopy   . Polypectomy     History   Social History  . Marital Status: Married    Spouse Name: N/A    Number of Children: N/A  . Years of Education: N/A   Occupational History  . Not on file.   Social History Main Topics  . Smoking status: Former Smoker -- 3.0 packs/day for 48 years    Types: Cigarettes    Quit date: 07/03/2002  . Smokeless tobacco: Former User    Quit date: 07/11/2002  . Alcohol Use: Yes     occasional  . Drug Use: No  . Sexually Active: Not on file   Other Topics Concern  . Not on file    Social History Narrative  . No narrative on file    Family History  Problem Relation Age of Onset  . Heart failure Father   . Heart disease Mother     before age 60  . Hyperlipidemia Mother   . Hypertension Mother     Allergies as of 02/12/2012  . (No Known Allergies)    Current Outpatient Prescriptions on File Prior to Visit  Medication Sig Dispense Refill  . albuterol (PROVENTIL HFA;VENTOLIN HFA) 108 (90 BASE) MCG/ACT inhaler Inhale 1-2 puffs into the lungs every 6 (six) hours as needed. Shortness of breath  3 Inhaler  3  . albuterol (PROVENTIL) (2.5 MG/3ML) 0.083% nebulizer solution Take 2.5 mg by nebulization 4 (four) times daily.       . amLODipine (NORVASC) 5 MG tablet Take 5 mg by mouth daily.        . aspirin 325 MG tablet Take 325 mg by mouth daily.      . budesonide (PULMICORT) 0.5 MG/2ML nebulizer solution Take 0.5 mg by nebulization 2 (two) times daily.       . candesartan (ATACAND) 32 MG tablet Take 32 mg by mouth daily.        .   carvedilol (COREG) 12.5 MG tablet Take 12.5 mg by mouth 2 (two) times daily with a meal.        . digoxin (LANOXIN) 0.125 MG tablet Take 125 mcg by mouth daily.        . ferrous sulfate 325 (65 FE) MG tablet Take 325 mg by mouth daily with breakfast.      . furosemide (LASIX) 40 MG tablet Take 2 tabs by mouth in the morning and 1 tab in the evening  270 tablet  3  . guaiFENesin (MUCINEX) 600 MG 12 hr tablet Take 600 mg by mouth 2 (two) times daily as needed. congestion      . HYDROcodone-acetaminophen (VICODIN) 2.5-500 MG per tablet Take 1 tablet by mouth every 6 (six) hours as needed. Back pain      . Misc. Devices (ACAPELLA) MISC Use flutter valve as directed  1 each  0  . nitroGLYCERIN (NITRODUR - DOSED IN MG/24 HR) 0.2 mg/hr Place 1 patch onto the skin daily.       . nitroGLYCERIN (NITROSTAT) 0.4 MG SL tablet Place 0.4 mg under the tongue every 5 (five) minutes as needed. May repeat x3 For chest pain      . potassium chloride SA  (K-DUR,KLOR-CON) 20 MEQ tablet Take 1 tablet (20 mEq total) by mouth daily.  90 tablet  3  . predniSONE (DELTASONE) 10 MG tablet Take 1 tablet (10 mg total) by mouth daily.  30 tablet  4  . simvastatin (ZOCOR) 20 MG tablet Take 20 mg by mouth at bedtime.        . tiotropium (SPIRIVA) 18 MCG inhalation capsule Place 18 mcg into inhaler and inhale daily.           REVIEW OF SYSTEMS: No chest pain, no shortness of breath, no fevers, no chills, no abdominal pain  PHYSICAL EXAMINATION:   Vital signs are BP 131/46  Pulse 86  Resp 16  Ht 5' 7" (1.702 m)  Wt 173 lb (78.472 kg)  BMI 27.10 kg/m2  SpO2 91% General: The patient appears their stated age. HEENT:  No gross abnormalities Pulmonary:  Non labored breathing Abdomen: Soft and non-tender Musculoskeletal: There are no major deformities. Neurologic: No focal weakness or paresthesias are detected, Skin: There are no ulcer or rashes noted. Psychiatric: The patient has normal affect. Cardiovascular: There is a regular rate and rhythm without significant murmur appreciated. Palpable femoral pulses bilaterally   Diagnostic Studies Ultrasound was ordered and reviewed today. This reveals a decrease in his ankle brachial indices. Today they measure 0.38 on the left and they were 0.68 in February. On the right he remained stable at 0.4.  Assessment: Status post bilateral iliac intervention for claudication Plan: The patient has had a significant decrease in the ankle brachial indices on the left. His ABI has gone from 0.68 to 0.38 over the course of the last 6 months. Fortunately, he has remained asymptomatic.  However, I do think that we need to further evaluate this area with angiography. I will plan on accessing the left femoral artery, performing an aortogram with bilateral runoff and intervening as needed. I will consider using a covered stent on the left iliac should he have evidence of in-stent stenosis. His previous stents 48 mm  Genesis.  V. Wells Brabham IV, M.D. [facility] [place] Office: [phone] Pager:  [phone]   

## 2012-02-14 ENCOUNTER — Encounter (HOSPITAL_COMMUNITY): Payer: Self-pay | Admitting: Pharmacy Technician

## 2012-02-20 MED ORDER — SODIUM CHLORIDE 0.9 % IV SOLN
INTRAVENOUS | Status: DC
  Administered 2012-02-21: 1000 mL via INTRAVENOUS

## 2012-02-21 ENCOUNTER — Telehealth: Payer: Self-pay | Admitting: Surgery

## 2012-02-21 ENCOUNTER — Other Ambulatory Visit: Payer: Self-pay | Admitting: *Deleted

## 2012-02-21 ENCOUNTER — Encounter (HOSPITAL_COMMUNITY)
Admission: RE | Disposition: A | Discharge status: Home / Self Care | Payer: Self-pay | Source: Ambulatory Visit | Attending: Surgery

## 2012-02-21 ENCOUNTER — Ambulatory Visit (HOSPITAL_COMMUNITY)
Admission: RE | Admit: 2012-02-21 | Discharge: 2012-02-21 | Disposition: A | Discharge status: Home / Self Care | Payer: Medicare Other | Source: Ambulatory Visit | Attending: Surgery | Admitting: Surgery

## 2012-02-21 DIAGNOSIS — J438 Other emphysema: Secondary | ICD-10-CM | POA: Insufficient documentation

## 2012-02-21 DIAGNOSIS — I70219 Atherosclerosis of native arteries of extremities with intermittent claudication, unspecified extremity: Secondary | ICD-10-CM

## 2012-02-21 DIAGNOSIS — I739 Peripheral vascular disease, unspecified: Secondary | ICD-10-CM

## 2012-02-21 DIAGNOSIS — I10 Essential (primary) hypertension: Secondary | ICD-10-CM | POA: Insufficient documentation

## 2012-02-21 DIAGNOSIS — I509 Heart failure, unspecified: Secondary | ICD-10-CM | POA: Insufficient documentation

## 2012-02-21 DIAGNOSIS — I2510 Atherosclerotic heart disease of native coronary artery without angina pectoris: Secondary | ICD-10-CM | POA: Insufficient documentation

## 2012-02-21 DIAGNOSIS — Z95810 Presence of automatic (implantable) cardiac defibrillator: Secondary | ICD-10-CM | POA: Insufficient documentation

## 2012-02-21 DIAGNOSIS — Z8673 Personal history of transient ischemic attack (TIA), and cerebral infarction without residual deficits: Secondary | ICD-10-CM | POA: Insufficient documentation

## 2012-02-21 DIAGNOSIS — Z48812 Encounter for surgical aftercare following surgery on the circulatory system: Secondary | ICD-10-CM

## 2012-02-21 DIAGNOSIS — E785 Hyperlipidemia, unspecified: Secondary | ICD-10-CM | POA: Insufficient documentation

## 2012-02-21 DIAGNOSIS — I70209 Unspecified atherosclerosis of native arteries of extremities, unspecified extremity: Secondary | ICD-10-CM | POA: Insufficient documentation

## 2012-02-21 DIAGNOSIS — Z9981 Dependence on supplemental oxygen: Secondary | ICD-10-CM | POA: Insufficient documentation

## 2012-02-21 HISTORY — PX: ABDOMINAL AORTAGRAM: SHX5454

## 2012-02-21 LAB — GLUCOSE, CAPILLARY: Glucose-Capillary: 101 mg/dL — ABNORMAL HIGH (ref 70–99)

## 2012-02-21 LAB — POCT I-STAT, CHEM 8
BUN: 15 mg/dL (ref 6–23)
Calcium, Ion: 1.21 mmol/L (ref 1.13–1.30)
Chloride: 99 mEq/L (ref 96–112)
Creatinine, Ser: 1 mg/dL (ref 0.50–1.35)

## 2012-02-21 SURGERY — ABDOMINAL AORTAGRAM
Anesthesia: LOCAL

## 2012-02-21 MED ORDER — FENTANYL CITRATE 0.05 MG/ML IJ SOLN
INTRAMUSCULAR | Status: AC
  Filled 2012-02-21: qty 2

## 2012-02-21 MED ORDER — MORPHINE SULFATE 10 MG/ML IJ SOLN: 2.0000 mg | INTRAMUSCULAR | Status: DC | PRN

## 2012-02-21 MED ORDER — LIDOCAINE HCL (PF) 1 % IJ SOLN
INTRAMUSCULAR | Status: AC
  Filled 2012-02-21: qty 30

## 2012-02-21 MED ORDER — MIDAZOLAM HCL 2 MG/2ML IJ SOLN
INTRAMUSCULAR | Status: AC
  Filled 2012-02-21: qty 2

## 2012-02-21 MED ORDER — ACETAMINOPHEN 325 MG RE SUPP: 325.0000 mg | RECTAL | Status: DC | PRN

## 2012-02-21 MED ORDER — METOPROLOL TARTRATE 1 MG/ML IV SOLN: 2.0000 mg | INTRAVENOUS | Status: DC | PRN

## 2012-02-21 MED ORDER — CLONIDINE HCL 0.2 MG PO TABS: 0.2000 mg | ORAL_TABLET | ORAL | Status: DC | PRN

## 2012-02-21 MED ORDER — ALUM & MAG HYDROXIDE-SIMETH 200-200-20 MG/5ML PO SUSP: 15.0000 mL | ORAL | Status: DC | PRN

## 2012-02-21 MED ORDER — ACETAMINOPHEN 325 MG PO TABS
ORAL_TABLET | ORAL | Status: AC
  Administered 2012-02-21: 325 mg via ORAL
  Filled 2012-02-21: qty 2

## 2012-02-21 MED ORDER — SODIUM CHLORIDE 0.9 % IV SOLN: INTRAVENOUS | Status: DC

## 2012-02-21 MED ORDER — OXYCODONE HCL 5 MG PO TABS: 5.0000 mg | ORAL_TABLET | ORAL | Status: DC | PRN

## 2012-02-21 MED ORDER — PHENOL 1.4 % MT LIQD: 1.0000 | OROMUCOSAL | Status: DC | PRN

## 2012-02-21 MED ORDER — GUAIFENESIN-DM 100-10 MG/5ML PO SYRP: 15.0000 mL | ORAL_SOLUTION | ORAL | Status: DC | PRN

## 2012-02-21 MED ORDER — ACETAMINOPHEN 325 MG PO TABS
325.0000 mg | ORAL_TABLET | ORAL | Status: DC | PRN
  Administered 2012-02-21: 325 mg via ORAL

## 2012-02-21 MED ORDER — HEPARIN (PORCINE) IN NACL 2-0.9 UNIT/ML-% IJ SOLN
INTRAMUSCULAR | Status: AC
  Filled 2012-02-21: qty 1000

## 2012-02-21 MED ORDER — ONDANSETRON HCL 4 MG/2ML IJ SOLN: 4.0000 mg | Freq: Four times a day (QID) | INTRAMUSCULAR | Status: DC | PRN

## 2012-02-21 NOTE — Progress Notes (Deleted)
1335 HCTZ  25 mgm & Ramipril 10 mgm taken from home.

## 2012-02-21 NOTE — Interval H&P Note (Signed)
History and Physical Interval Note:  02/21/2012 8:34 AM  [name]  has presented today for surgery, with the diagnosis of pvd  The various methods of treatment have been discussed with the patient and family. After consideration of risks, benefits and other options for treatment, the patient has consented to  Procedure(s) (LRB): ABDOMINAL AORTAGRAM (N/A) as a surgical intervention .  The patient's history has been reviewed, patient examined, no change in status, stable for surgery.  I have reviewed the patient's chart and labs.  Questions were answered to the patient's satisfaction.     BRABHAM IV, V. WELLS

## 2012-02-21 NOTE — Op Note (Signed)
[facility] [place]  Patient name: [name] MRN: [id number] DOB: [birth year] Sex: male  02/21/2012 Pre-operative Diagnosis: iliac occlusive disease Post-operative diagnosis:  Same Surgeon:  Jorge Ny Procedure Performed:  1.  ultrasound access left femoral artery  2.  abdominal aortogram  3.  bilateral lower extremity runoff    Indications:  The patient has a history of left common iliac artery stenting x2 and right external iliac stenting. He recently underwent a duplex ultrasound which revealed monophasic waveforms in the iliac vessels. Due to bowel gas the stents could not be visualized. He therefore comes in today for angiogram to evaluate for in-stent stenosis because he had a significant drop in ankle-brachial index on the left.  Procedure:  The patient was identified in the holding area and taken to room 8.  The patient was then placed supine on the table and prepped and draped in the usual sterile fashion.  A time out was called.  Ultrasound was used to evaluate the left common femoral artery.  It was patent .  A digital ultrasound image was acquired.  A micropuncture needle was used to access the left common femoral artery under ultrasound guidance.  An 018 wire was advanced without resistance and a micropuncture sheath was placed.  The 018 wire was removed and a benson wire was placed.  The micropuncture sheath was exchanged for a 5 french sheath.  An omniflush catheter was advanced over the wire to the level of L-1.  An abdominal angiogram was obtained. Next, the catheter was pulled down to the aortic bifurcation and pelvic angiograms and oblique views were obtained followed by bilateral lower extremity runoff  Findings:   Aortogram:  The visualized portions of the suprarenal abdominal aorta showed no significant disease. Plaque is visualized at the ostium of both renal arteries without significant stenosis. The infrarenal abdominal aorta is  heavily calcified, however no significant stenosis is visualized. Stents within the left common iliac artery appeared to be widely patent. Left hypogastric artery is widely patent as is the left external iliac artery. There is mild narrowing at the origin of the right common iliac artery approximately 20-30% stenosis. The right hypogastric artery is widely patent. The stent within the right external iliac artery is widely patent as is the remaining portion of the right external iliac artery.  Right Lower Extremity:  The right common femoral artery is heavily calcified. The profunda femoral artery is patent throughout it's course the superficial femoral artery is diffusely diseased with several areas of high-grade stenosis. The popliteal artery has a short segment occlusion of approximately 10 cm above the knee. There is reconstitution distally. There is single vessel runoff via the peroneal artery which reconstitutes a posterior tibial artery at the ankle.  Left Lower Extremity:  There is a focal stenosis within the left common femoral artery. The profunda femoral artery is widely patent. There is diffuse disease throughout the superficial femoral artery with several areas of calcified plaque and significant stenoses. The popliteal artery above the knee is also heavily diseased with significant stenosis. All 3 tibial vessels with. He patent down to the ankle however the anterior tibial and peroneal artery appear to be diminutive with the posterior tibial artery being the dominant vessel across the ankle  Intervention:  Pull back pressures were checked from the aorta to the left distal external iliac artery. There was no pressure gradient in this region  Impression:  #1  no significant aortoiliac occlusive disease  #  2  stable outflow and runoff as described above  V. Durene Cal, M.D. [facility] [place] Office: [phone] Pager:  [phone]

## 2012-02-21 NOTE — Progress Notes (Addendum)
1355 post procedure groin care and medication discharge instructions given.  Patient and spouse voice understanding. 1405 Ambulatory to bathroom.  Catheterized himself without incident.  Left groin without sign of bleeding or hematoma.

## 2012-02-21 NOTE — Interval H&P Note (Signed)
History and Physical Interval Note:  02/21/2012 8:32 AM  [name]  has presented today for surgery, with the diagnosis of pvd  The various methods of treatment have been discussed with the patient and family. After consideration of risks, benefits and other options for treatment, the patient has consented to  Procedure(s) (LRB): ABDOMINAL AORTAGRAM (N/A) as a surgical intervention .  The patient's history has been reviewed, patient examined, no change in status, stable for surgery.  I have reviewed the patient's chart and labs.  Questions were answered to the patient's satisfaction.     [name], V. WELLS

## 2012-02-21 NOTE — Telephone Encounter (Signed)
Sent letter, pt no available by phone, dpm

## 2012-02-21 NOTE — Telephone Encounter (Signed)
Message copied by Fredrich Birks on Wed Feb 21, 2012 11:34 AM ------      Message from: Melene Plan      Created: Wed Feb 21, 2012 10:48 AM                   ----- Message -----         From: Nada Libman, MD         Sent: 02/21/2012   9:43 AM           To: Reuel Derby, Melene Plan, RN            8/13/ 2013, the patient had the following procedures:                   1.  ultrasound access left femoral artery       2.  abdominal aortogram       3.  bilateral lower extremity runoff                  Please schedule him to come back to see me in 3 months with a repeat duplex an ABI

## 2012-02-21 NOTE — H&P (View-Only) (Signed)
[facility] [place]   Patient name: [name] MRN: [id number] DOB: [birth year] Sex: male     Chief Complaint  Patient presents with  . PVD    6 month f/u bilateral iliac stents    HISTORY OF PRESENT ILLNESS: Patient is back today for followup. He is status post left common iliac stent in October of 2009 4 claudication. He also underwent stenting of his right external iliac artery in February of 2012 and also re\re intervention of the left common iliac artery at that time. He remained asymptomatic and is here today for his surveillance ultrasound. He has recently stopped his Coumadin secondary to a fall associated with a head bleed. He is on a full aspirin.  Past Medical History  Diagnosis Date  . Hypokalemia   . Colon cancer   . PVD (peripheral vascular disease)   . HTN (hypertension)   . Automatic implantable cardiac defibrillator in situ   . CHF (congestive heart failure)   . Ventricular tachycardia   . Ischemic heart disease, chronic   . Dyslipidemia   . CAD (coronary artery disease)   . COPD (chronic obstructive pulmonary disease)   . Emphysema   . On home oxygen therapy   . Difficult intubation   . Stroke     Past Surgical History  Procedure Date  . Lumbar back surgery     x2  . Cholecystectomy   . Iliac artery stent     left  . Implantation of medtronic concerto bi-v icd 04-10-08    [name] MD  . Colonoscopy   . Polypectomy     History   Social History  . Marital Status: Married    Spouse Name: N/A    Number of Children: N/A  . Years of Education: N/A   Occupational History  . Not on file.   Social History Main Topics  . Smoking status: Former Smoker -- 3.0 packs/day for 48 years    Types: Cigarettes    Quit date: 07/03/2002  . Smokeless tobacco: Former Neurosurgeon    Quit date: 07/11/2002  . Alcohol Use: Yes     occasional  . Drug Use: No  . Sexually Active: Not on file   Other Topics Concern  . Not on file    Social History Narrative  . No narrative on file    Family History  Problem Relation Age of Onset  . Heart failure Father   . Heart disease Mother     before age 73  . Hyperlipidemia Mother   . Hypertension Mother     Allergies as of 02/12/2012  . (No Known Allergies)    Current Outpatient Prescriptions on File Prior to Visit  Medication Sig Dispense Refill  . albuterol (PROVENTIL HFA;VENTOLIN HFA) 108 (90 BASE) MCG/ACT inhaler Inhale 1-2 puffs into the lungs every 6 (six) hours as needed. Shortness of breath  3 Inhaler  3  . albuterol (PROVENTIL) (2.5 MG/3ML) 0.083% nebulizer solution Take 2.5 mg by nebulization 4 (four) times daily.       Marland Kitchen amLODipine (NORVASC) 5 MG tablet Take 5 mg by mouth daily.        Marland Kitchen aspirin 325 MG tablet Take 325 mg by mouth daily.      . budesonide (PULMICORT) 0.5 MG/2ML nebulizer solution Take 0.5 mg by nebulization 2 (two) times daily.       . candesartan (ATACAND) 32 MG tablet Take 32 mg by mouth daily.        Marland Kitchen  carvedilol (COREG) 12.5 MG tablet Take 12.5 mg by mouth 2 (two) times daily with a meal.        . digoxin (LANOXIN) 0.125 MG tablet Take 125 mcg by mouth daily.        . ferrous sulfate 325 (65 FE) MG tablet Take 325 mg by mouth daily with breakfast.      . furosemide (LASIX) 40 MG tablet Take 2 tabs by mouth in the morning and 1 tab in the evening  270 tablet  3  . guaiFENesin (MUCINEX) 600 MG 12 hr tablet Take 600 mg by mouth 2 (two) times daily as needed. congestion      . HYDROcodone-acetaminophen (VICODIN) 2.5-500 MG per tablet Take 1 tablet by mouth every 6 (six) hours as needed. Back pain      . Misc. Devices (ACAPELLA) MISC Use flutter valve as directed  1 each  0  . nitroGLYCERIN (NITRODUR - DOSED IN MG/24 HR) 0.2 mg/hr Place 1 patch onto the skin daily.       . nitroGLYCERIN (NITROSTAT) 0.4 MG SL tablet Place 0.4 mg under the tongue every 5 (five) minutes as needed. May repeat x3 For chest pain      . potassium chloride SA  (K-DUR,KLOR-CON) 20 MEQ tablet Take 1 tablet (20 mEq total) by mouth daily.  90 tablet  3  . predniSONE (DELTASONE) 10 MG tablet Take 1 tablet (10 mg total) by mouth daily.  30 tablet  4  . simvastatin (ZOCOR) 20 MG tablet Take 20 mg by mouth at bedtime.        Marland Kitchen tiotropium (SPIRIVA) 18 MCG inhalation capsule Place 18 mcg into inhaler and inhale daily.           REVIEW OF SYSTEMS: No chest pain, no shortness of breath, no fevers, no chills, no abdominal pain  PHYSICAL EXAMINATION:   Vital signs are BP 131/46  Pulse 86  Resp 16  Ht 5\' 7"  (1.702 m)  Wt 173 lb (78.472 kg)  BMI 27.10 kg/m2  SpO2 91% General: The patient appears their stated age. HEENT:  No gross abnormalities Pulmonary:  Non labored breathing Abdomen: Soft and non-tender Musculoskeletal: There are no major deformities. Neurologic: No focal weakness or paresthesias are detected, Skin: There are no ulcer or rashes noted. Psychiatric: The patient has normal affect. Cardiovascular: There is a regular rate and rhythm without significant murmur appreciated. Palpable femoral pulses bilaterally   Diagnostic Studies Ultrasound was ordered and reviewed today. This reveals a decrease in his ankle brachial indices. Today they measure 0.38 on the left and they were 0.68 in February. On the right he remained stable at 0.4.  Assessment: Status post bilateral iliac intervention for claudication Plan: The patient has had a significant decrease in the ankle brachial indices on the left. His ABI has gone from 0.68 to 0.38 over the course of the last 6 months. Fortunately, he has remained asymptomatic.  However, I do think that we need to further evaluate this area with angiography. I will plan on accessing the left femoral artery, performing an aortogram with bilateral runoff and intervening as needed. I will consider using a covered stent on the left iliac should he have evidence of in-stent stenosis. His previous stents 48 mm  Genesis.  Jorge Ny, M.D. [facility] [place] Office: [phone] Pager:  [phone]

## 2012-03-11 ENCOUNTER — Other Ambulatory Visit: Payer: Self-pay | Admitting: Pulmonary Disease

## 2012-03-14 ENCOUNTER — Encounter: Payer: Medicare Other | Admitting: *Deleted

## 2012-03-29 ENCOUNTER — Encounter: Payer: Self-pay | Admitting: *Deleted

## 2012-04-03 ENCOUNTER — Ambulatory Visit (INDEPENDENT_AMBULATORY_CARE_PROVIDER_SITE_OTHER): Payer: Medicare Other | Admitting: *Deleted

## 2012-04-03 ENCOUNTER — Encounter: Payer: Self-pay | Admitting: Internal Medicine

## 2012-04-03 DIAGNOSIS — I472 Ventricular tachycardia: Secondary | ICD-10-CM

## 2012-04-03 DIAGNOSIS — I5022 Chronic systolic (congestive) heart failure: Secondary | ICD-10-CM

## 2012-04-10 LAB — REMOTE ICD DEVICE
AL IMPEDENCE ICD: 646 Ohm
ATRIAL PACING ICD: 83.03 pct
BRDY-0002LV: 60 {beats}/min
BRDY-0003LV: 115 {beats}/min
CHARGE TIME: 11.481 s
FVT: 0
PACEART VT: 0
RV LEAD AMPLITUDE: 17 mv
RV LEAD IMPEDENCE ICD: 494 Ohm
TOT-0001: 2
TOT-0002: 1
TZAT-0001ATACH: 2
TZAT-0001FASTVT: 1
TZAT-0001SLOWVT: 1
TZAT-0001SLOWVT: 2
TZAT-0002ATACH: NEGATIVE
TZAT-0002ATACH: NEGATIVE
TZAT-0002ATACH: NEGATIVE
TZAT-0004FASTVT: 8
TZAT-0005SLOWVT: 88 pct
TZAT-0018ATACH: NEGATIVE
TZAT-0018SLOWVT: NEGATIVE
TZAT-0018SLOWVT: NEGATIVE
TZAT-0019ATACH: 6 V
TZAT-0019ATACH: 6 V
TZAT-0019ATACH: 6 V
TZAT-0019FASTVT: 8 V
TZAT-0019SLOWVT: 8 V
TZAT-0019SLOWVT: 8 V
TZAT-0020FASTVT: 1.5 ms
TZON-0004VSLOWVT: 40
TZON-0005SLOWVT: 12
TZST-0001ATACH: 5
TZST-0001ATACH: 6
TZST-0001FASTVT: 2
TZST-0001FASTVT: 4
TZST-0001FASTVT: 6
TZST-0001SLOWVT: 3
TZST-0001SLOWVT: 5
TZST-0002ATACH: NEGATIVE
TZST-0002ATACH: NEGATIVE
TZST-0003FASTVT: 35 J
TZST-0003FASTVT: 35 J
TZST-0003FASTVT: 35 J
TZST-0003SLOWVT: 35 J

## 2012-04-18 ENCOUNTER — Encounter: Payer: Self-pay | Admitting: *Deleted

## 2012-05-08 ENCOUNTER — Ambulatory Visit: Payer: Medicare Other | Admitting: Neurosurgery

## 2012-05-16 ENCOUNTER — Other Ambulatory Visit: Payer: Self-pay | Admitting: *Deleted

## 2012-05-16 MED ORDER — ALBUTEROL SULFATE HFA 108 (90 BASE) MCG/ACT IN AERS: 1.0000 | INHALATION_SPRAY | Freq: Four times a day (QID) | RESPIRATORY_TRACT | Status: DC | PRN

## 2012-05-27 ENCOUNTER — Ambulatory Visit: Payer: Medicare Other | Admitting: Surgery

## 2012-05-29 ENCOUNTER — Encounter: Payer: Self-pay | Admitting: Surgery

## 2012-06-03 ENCOUNTER — Other Ambulatory Visit: Payer: Self-pay

## 2012-06-03 ENCOUNTER — Ambulatory Visit (INDEPENDENT_AMBULATORY_CARE_PROVIDER_SITE_OTHER): Payer: Medicare Other | Admitting: Pulmonary Disease

## 2012-06-03 ENCOUNTER — Encounter: Payer: Self-pay | Admitting: Surgery

## 2012-06-03 ENCOUNTER — Encounter (INDEPENDENT_AMBULATORY_CARE_PROVIDER_SITE_OTHER): Payer: Medicare Other | Admitting: *Deleted

## 2012-06-03 ENCOUNTER — Encounter (HOSPITAL_COMMUNITY): Payer: Self-pay | Admitting: Pharmacy Technician

## 2012-06-03 ENCOUNTER — Ambulatory Visit (INDEPENDENT_AMBULATORY_CARE_PROVIDER_SITE_OTHER): Payer: Medicare Other | Admitting: Surgery

## 2012-06-03 ENCOUNTER — Encounter: Payer: Self-pay | Admitting: Pulmonary Disease

## 2012-06-03 VITALS — BP 128/42 | HR 90 | Temp 97.2°F | Ht 67.0 in | Wt 183.8 lb

## 2012-06-03 VITALS — BP 160/51 | HR 79 | Ht 67.0 in | Wt 182.0 lb

## 2012-06-03 DIAGNOSIS — I739 Peripheral vascular disease, unspecified: Secondary | ICD-10-CM

## 2012-06-03 DIAGNOSIS — I70219 Atherosclerosis of native arteries of extremities with intermittent claudication, unspecified extremity: Secondary | ICD-10-CM

## 2012-06-03 DIAGNOSIS — Z48812 Encounter for surgical aftercare following surgery on the circulatory system: Secondary | ICD-10-CM

## 2012-06-03 DIAGNOSIS — J449 Chronic obstructive pulmonary disease, unspecified: Secondary | ICD-10-CM

## 2012-06-03 NOTE — Assessment & Plan Note (Signed)
The patient appears to be stable from a pulmonary standpoint, and I've asked him to continue on his current bronchodilator regimen.  I've also asked him to stay as active as possible and to build up his conditioning.  He also needs to watch his fluid balance carefully since his weight is up 9 pounds since last visit.

## 2012-06-03 NOTE — Patient Instructions (Addendum)
No change in current medications. Stay as active as possible with oxygen followup with me in 4mos

## 2012-06-03 NOTE — Progress Notes (Signed)
[facility] [place]   Patient name: [name] MRN: [id number] DOB: 06/23/1936 Sex: male     Chief Complaint  Patient presents with  . PVD    3 month f/u - pt c/o of bilateral leg pain lateral aspect, also numbness of feet x2 months    HISTORY OF PRESENT ILLNESS: The patient comes back for followup. He has a history of left common iliac artery stenting in 2012. This was a Cordis 8 x 24 balloon expandable stents. He has also undergone stenting in his left common iliac artery and 2009 with a Genesis 8 x 24. He recently underwent angiography which revealed no significant in-stent stenosis. He was relatively asymptomatic at that time and so no intervention was performed. He is now back today complaining of worsening symptoms. He is having trouble walking more than 100 yards. His legs give out and he is very unsteady on his feet.  Past Medical History  Diagnosis Date  . Hypokalemia   . Colon cancer   . PVD (peripheral vascular disease)   . HTN (hypertension)   . Automatic implantable cardiac defibrillator in situ   . CHF (congestive heart failure)   . Ventricular tachycardia   . Ischemic heart disease, chronic   . Dyslipidemia   . CAD (coronary artery disease)   . COPD (chronic obstructive pulmonary disease)   . Emphysema   . On home oxygen therapy   . Difficult intubation   . Stroke     Past Surgical History  Procedure Date  . Lumbar back surgery     x2  . Cholecystectomy   . Iliac artery stent     left  . Implantation of medtronic concerto bi-v icd 04-10-08    Gregg Taylor MD  . Colonoscopy   . Polypectomy     History   Social History  . Marital Status: Married    Spouse Name: N/A    Number of Children: N/A  . Years of Education: N/A   Occupational History  . Not on file.   Social History Main Topics  . Smoking status: Former Smoker -- 3.0 packs/day for 48 years    Types: Cigarettes    Quit date: 07/03/2002  . Smokeless tobacco:  Former User    Quit date: 07/11/2002  . Alcohol Use: Yes     Comment: occasional  . Drug Use: No  . Sexually Active: Not on file   Other Topics Concern  . Not on file   Social History Narrative  . No narrative on file    Family History  Problem Relation Age of Onset  . Heart failure Father   . Heart disease Mother     before age 60  . Hyperlipidemia Mother   . Hypertension Mother     Allergies as of 06/03/2012  . (No Known Allergies)    Current Outpatient Prescriptions on File Prior to Visit  Medication Sig Dispense Refill  . albuterol (PROVENTIL HFA;VENTOLIN HFA) 108 (90 BASE) MCG/ACT inhaler Inhale 1-2 puffs into the lungs every 6 (six) hours as needed. Shortness of breath  3 Inhaler  3  . albuterol (PROVENTIL) (2.5 MG/3ML) 0.083% nebulizer solution Take 2.5 mg by nebulization 4 (four) times daily.       . amLODipine (NORVASC) 5 MG tablet Take 5 mg by mouth daily.        . aspirin 325 MG tablet Take 325 mg by mouth daily.      . budesonide (PULMICORT) 0.5 MG/2ML   nebulizer solution Take 0.5 mg by nebulization 2 (two) times daily.       . candesartan (ATACAND) 32 MG tablet Take 32 mg by mouth daily.        . carvedilol (COREG) 12.5 MG tablet Take 12.5 mg by mouth 2 (two) times daily with a meal.        . digoxin (LANOXIN) 0.125 MG tablet Take 125 mcg by mouth daily.        . ferrous sulfate 325 (65 FE) MG tablet Take 325 mg by mouth daily with breakfast.      . furosemide (LASIX) 40 MG tablet Take 40-80 mg by mouth 2 (two) times daily. Take 2 tablets in the morning and 1 tablet in the evening      . guaiFENesin (MUCINEX) 600 MG 12 hr tablet Take 600 mg by mouth 2 (two) times daily as needed. congestion      . HYDROcodone-acetaminophen (VICODIN) 2.5-500 MG per tablet Take 1 tablet by mouth every 6 (six) hours as needed. Back pain      . Misc. Devices (ACAPELLA) MISC Use flutter valve as directed  1 each  0  . nitroGLYCERIN (NITRODUR - DOSED IN MG/24 HR) 0.2 mg/hr Place 1  patch onto the skin daily.       . potassium chloride SA (K-DUR,KLOR-CON) 20 MEQ tablet Take 1 tablet (20 mEq total) by mouth daily.  90 tablet  3  . predniSONE (DELTASONE) 10 MG tablet TAKE ONE TABLET BY MOUTH ONE TIME DAILY  30 tablet  2  . simvastatin (ZOCOR) 20 MG tablet Take 20 mg by mouth at bedtime.        . tiotropium (SPIRIVA) 18 MCG inhalation capsule Place 18 mcg into inhaler and inhale daily.        . nitroGLYCERIN (NITROSTAT) 0.4 MG SL tablet Place 0.4 mg under the tongue every 5 (five) minutes as needed. May repeat x3 For chest pain         REVIEW OF SYSTEMS: Please see history of present illness, no changes  PHYSICAL EXAMINATION:   Vital signs are BP 160/51  Pulse 79  Ht 5' 7" (1.702 m)  Wt 182 lb (82.555 kg)  BMI 28.51 kg/m2  SpO2 94% General: The patient appears their stated age. HEENT:  No gross abnormalities Pulmonary:  Non labored breathingr Musculoskeletal: There are no major deformities. Neurologic: No focal weakness or paresthesias are detected, Skin: There are no ulcer or rashes noted. Psychiatric: The patient has normal affect. Cardiovascular: Pedal pulses are not palpable   Diagnostic Studies Ultrasound was ordered and reviewed. This shows a decrease in his ankle brachial indices bilaterally. The right is now down to 0.31 the left is 0.22 these are decreased from over a month ago  Assessment: Worsening peripheral vascular disease Plan: Patient be scheduled for angiography tomorrow. He understands that he is a very poor percutaneous candidate for endovascular repair of his superficial femoral-popliteal disease. However, he may have options to treat his inflow. I will plan on accessing his right groin performing an aortogram with bilateral runoff and intervening if indicated.  V. Wells Brabham IV, M.D. [facility] [place] Office: [phone] Pager:  [phone]   

## 2012-06-03 NOTE — Progress Notes (Signed)
  Subjective:    Patient ID: [name], male    DOB: 07-15-1935, 76 y.o.   MRN: [id number]  HPI The patient comes in today for followup of his known COPD with cardiorespiratory failure.  He also has significant underlying cardiac disease.  He has been taking his medications religiously, and feels that his quality of life and breathing are at baseline.  He denies any significant cough or acute exacerbation since last visit.  He has gained 9 pounds since his last visit, and I suspect this is related to fluid.   Review of Systems  Constitutional: Negative for fever and unexpected weight change.  HENT: Positive for congestion. Negative for ear pain, nosebleeds, sore throat, rhinorrhea, sneezing, trouble swallowing, dental problem, postnasal drip and sinus pressure.   Eyes: Negative for redness and itching.  Respiratory: Positive for shortness of breath. Negative for cough, chest tightness and wheezing.   Cardiovascular: Negative for palpitations and leg swelling.  Gastrointestinal: Negative for nausea and vomiting.  Genitourinary: Negative for dysuria.  Musculoskeletal: Negative for joint swelling.  Skin: Negative for rash.  Neurological: Negative for headaches.  Hematological: Bruises/bleeds easily.  Psychiatric/Behavioral: Negative for dysphoric mood. The patient is not nervous/anxious.        Objective:   Physical Exam Well-developed male in no acute distress Nose without purulent discharge noted Neck without thyromegaly or JVD Chest with decreased breath sounds, no crackles or wheezes Cardiac exam with irregular rhythm with controlled ventricular response Lower extremities with 1+ edema on the right, left is trace.  No cyanosis noted Alert and oriented, moves all 4 extremities.       Assessment & Plan:

## 2012-06-04 ENCOUNTER — Other Ambulatory Visit: Payer: Self-pay | Admitting: *Deleted

## 2012-06-04 ENCOUNTER — Encounter (HOSPITAL_COMMUNITY)
Admission: RE | Disposition: A | Discharge status: Home / Self Care | Payer: Self-pay | Source: Ambulatory Visit | Attending: Surgery

## 2012-06-04 ENCOUNTER — Ambulatory Visit (HOSPITAL_COMMUNITY)
Admission: RE | Admit: 2012-06-04 | Discharge: 2012-06-04 | Disposition: A | Discharge status: Home / Self Care | Payer: Medicare Other | Source: Ambulatory Visit | Attending: Surgery | Admitting: Surgery

## 2012-06-04 ENCOUNTER — Telehealth: Payer: Self-pay | Admitting: Surgery

## 2012-06-04 DIAGNOSIS — J449 Chronic obstructive pulmonary disease, unspecified: Secondary | ICD-10-CM | POA: Insufficient documentation

## 2012-06-04 DIAGNOSIS — Z95810 Presence of automatic (implantable) cardiac defibrillator: Secondary | ICD-10-CM | POA: Insufficient documentation

## 2012-06-04 DIAGNOSIS — I10 Essential (primary) hypertension: Secondary | ICD-10-CM | POA: Insufficient documentation

## 2012-06-04 DIAGNOSIS — J4489 Other specified chronic obstructive pulmonary disease: Secondary | ICD-10-CM | POA: Insufficient documentation

## 2012-06-04 DIAGNOSIS — I708 Atherosclerosis of other arteries: Secondary | ICD-10-CM | POA: Insufficient documentation

## 2012-06-04 DIAGNOSIS — I739 Peripheral vascular disease, unspecified: Secondary | ICD-10-CM

## 2012-06-04 DIAGNOSIS — I70219 Atherosclerosis of native arteries of extremities with intermittent claudication, unspecified extremity: Secondary | ICD-10-CM | POA: Insufficient documentation

## 2012-06-04 HISTORY — PX: ABDOMINAL AORTAGRAM: SHX5454

## 2012-06-04 HISTORY — PX: INSERTION OF ILIAC STENT: SHX6256

## 2012-06-04 LAB — POCT I-STAT, CHEM 8
BUN: 17 mg/dL (ref 6–23)
HCT: 29 % — ABNORMAL LOW (ref 39.0–52.0)
Hemoglobin: 9.9 g/dL — ABNORMAL LOW (ref 13.0–17.0)
Sodium: 141 mEq/L (ref 135–145)
TCO2: 29 mmol/L (ref 0–100)

## 2012-06-04 SURGERY — ABDOMINAL AORTAGRAM
Anesthesia: LOCAL | Laterality: Right

## 2012-06-04 MED ORDER — HEPARIN SODIUM (PORCINE) 1000 UNIT/ML IJ SOLN
INTRAMUSCULAR | Status: AC
  Filled 2012-06-04: qty 1

## 2012-06-04 MED ORDER — LABETALOL HCL 5 MG/ML IV SOLN: 10.0000 mg | INTRAVENOUS | Status: DC | PRN

## 2012-06-04 MED ORDER — ALUM & MAG HYDROXIDE-SIMETH 200-200-20 MG/5ML PO SUSP: 15.0000 mL | ORAL | Status: DC | PRN

## 2012-06-04 MED ORDER — MORPHINE SULFATE 10 MG/ML IJ SOLN: 2.0000 mg | INTRAMUSCULAR | Status: DC | PRN

## 2012-06-04 MED ORDER — ONDANSETRON HCL 4 MG/2ML IJ SOLN: 4.0000 mg | Freq: Four times a day (QID) | INTRAMUSCULAR | Status: DC | PRN

## 2012-06-04 MED ORDER — SODIUM CHLORIDE 0.9 % IV SOLN: 1.0000 mL/kg/h | INTRAVENOUS | Status: DC

## 2012-06-04 MED ORDER — OXYCODONE-ACETAMINOPHEN 5-325 MG PO TABS: 1.0000 | ORAL_TABLET | ORAL | Status: DC | PRN

## 2012-06-04 MED ORDER — HEPARIN (PORCINE) IN NACL 2-0.9 UNIT/ML-% IJ SOLN
INTRAMUSCULAR | Status: AC
  Filled 2012-06-04: qty 1000

## 2012-06-04 MED ORDER — FENTANYL CITRATE 0.05 MG/ML IJ SOLN
INTRAMUSCULAR | Status: AC
  Filled 2012-06-04: qty 2

## 2012-06-04 MED ORDER — HYDRALAZINE HCL 20 MG/ML IJ SOLN: 10.0000 mg | INTRAMUSCULAR | Status: DC | PRN

## 2012-06-04 MED ORDER — MIDAZOLAM HCL 2 MG/2ML IJ SOLN
INTRAMUSCULAR | Status: AC
  Filled 2012-06-04: qty 2

## 2012-06-04 MED ORDER — METOPROLOL TARTRATE 1 MG/ML IV SOLN: 2.0000 mg | INTRAVENOUS | Status: DC | PRN

## 2012-06-04 MED ORDER — SODIUM CHLORIDE 0.9 % IV SOLN
INTRAVENOUS | Status: DC
  Administered 2012-06-04: 07:00:00 via INTRAVENOUS

## 2012-06-04 MED ORDER — GUAIFENESIN-DM 100-10 MG/5ML PO SYRP: 15.0000 mL | ORAL_SOLUTION | ORAL | Status: DC | PRN

## 2012-06-04 MED ORDER — ACETAMINOPHEN 325 MG PO TABS: 325.0000 mg | ORAL_TABLET | ORAL | Status: DC | PRN

## 2012-06-04 MED ORDER — LIDOCAINE HCL (PF) 1 % IJ SOLN
INTRAMUSCULAR | Status: AC
  Filled 2012-06-04: qty 30

## 2012-06-04 MED ORDER — ACETAMINOPHEN 325 MG RE SUPP: 325.0000 mg | RECTAL | Status: DC | PRN

## 2012-06-04 MED ORDER — PHENOL 1.4 % MT LIQD: 1.0000 | OROMUCOSAL | Status: DC | PRN

## 2012-06-04 NOTE — Telephone Encounter (Signed)
Message copied by Fredrich Birks on Tue Jun 04, 2012 12:51 PM ------      Message from: Melene Plan      Created: Tue Jun 04, 2012 10:09 AM                   ----- Message -----         From: Nada Libman, MD         Sent: 06/04/2012   9:12 AM           To: Reuel Derby, Melene Plan, RN            06/04/2012:             1.  ultrasound access right femoral artery       2.  abdominal aortogram       3.  bilateral lower extremity runoff       4.  second order catheterization (left external iliac artery)       5.  stent, right external iliac artery             Please bring the patient back to my clinic this coming Monday with lower extremity vein mapping

## 2012-06-04 NOTE — Telephone Encounter (Signed)
LVM for patient re: follow up appt, asked for confirmation call, dpm

## 2012-06-04 NOTE — H&P (View-Only) (Signed)
[facility]   Patient name: [name] MRN: [id number] DOB: 1936/06/27 Sex: male     Chief Complaint  Patient presents with  . PVD    3 month f/u - pt c/o of bilateral leg pain lateral aspect, also numbness of feet x2 months    HISTORY OF PRESENT ILLNESS: The patient comes back for followup. He has a history of left common iliac artery stenting in 2012. This was a Cordis 8 x 24 balloon expandable stents. He has also undergone stenting in his left common iliac artery and 2009 with a Genesis 8 x 24. He recently underwent angiography which revealed no significant in-stent stenosis. He was relatively asymptomatic at that time and so no intervention was performed. He is now back today complaining of worsening symptoms. He is having trouble walking more than 100 yards. His legs give out and he is very unsteady on his feet.  Past Medical History  Diagnosis Date  . Hypokalemia   . Colon cancer   . PVD (peripheral vascular disease)   . HTN (hypertension)   . Automatic implantable cardiac defibrillator in situ   . CHF (congestive heart failure)   . Ventricular tachycardia   . Ischemic heart disease, chronic   . Dyslipidemia   . CAD (coronary artery disease)   . COPD (chronic obstructive pulmonary disease)   . Emphysema   . On home oxygen therapy   . Difficult intubation   . Stroke     Past Surgical History  Procedure Date  . Lumbar back surgery     x2  . Cholecystectomy   . Iliac artery stent     left  . Implantation of medtronic concerto bi-v icd 04-10-08    [name] MD  . Colonoscopy   . Polypectomy     History   Social History  . Marital Status: Married    Spouse Name: N/A    Number of Children: N/A  . Years of Education: N/A   Occupational History  . Not on file.   Social History Main Topics  . Smoking status: Former Smoker -- 3.0 packs/day for 48 years    Types: Cigarettes    Quit date: 07/03/2002  . Smokeless tobacco:  Former Neurosurgeon    Quit date: 07/11/2002  . Alcohol Use: Yes     Comment: occasional  . Drug Use: No  . Sexually Active: Not on file   Other Topics Concern  . Not on file   Social History Narrative  . No narrative on file    Family History  Problem Relation Age of Onset  . Heart failure Father   . Heart disease Mother     before age 45  . Hyperlipidemia Mother   . Hypertension Mother     Allergies as of 06/03/2012  . (No Known Allergies)    Current Outpatient Prescriptions on File Prior to Visit  Medication Sig Dispense Refill  . albuterol (PROVENTIL HFA;VENTOLIN HFA) 108 (90 BASE) MCG/ACT inhaler Inhale 1-2 puffs into the lungs every 6 (six) hours as needed. Shortness of breath  3 Inhaler  3  . albuterol (PROVENTIL) (2.5 MG/3ML) 0.083% nebulizer solution Take 2.5 mg by nebulization 4 (four) times daily.       Marland Kitchen amLODipine (NORVASC) 5 MG tablet Take 5 mg by mouth daily.        Marland Kitchen aspirin 325 MG tablet Take 325 mg by mouth daily.      . budesonide (PULMICORT) 0.5 MG/2ML  nebulizer solution Take 0.5 mg by nebulization 2 (two) times daily.       . candesartan (ATACAND) 32 MG tablet Take 32 mg by mouth daily.        . carvedilol (COREG) 12.5 MG tablet Take 12.5 mg by mouth 2 (two) times daily with a meal.        . digoxin (LANOXIN) 0.125 MG tablet Take 125 mcg by mouth daily.        . ferrous sulfate 325 (65 FE) MG tablet Take 325 mg by mouth daily with breakfast.      . furosemide (LASIX) 40 MG tablet Take 40-80 mg by mouth 2 (two) times daily. Take 2 tablets in the morning and 1 tablet in the evening      . guaiFENesin (MUCINEX) 600 MG 12 hr tablet Take 600 mg by mouth 2 (two) times daily as needed. congestion      . HYDROcodone-acetaminophen (VICODIN) 2.5-500 MG per tablet Take 1 tablet by mouth every 6 (six) hours as needed. Back pain      . Misc. Devices (ACAPELLA) MISC Use flutter valve as directed  1 each  0  . nitroGLYCERIN (NITRODUR - DOSED IN MG/24 HR) 0.2 mg/hr Place 1  patch onto the skin daily.       . potassium chloride SA (K-DUR,KLOR-CON) 20 MEQ tablet Take 1 tablet (20 mEq total) by mouth daily.  90 tablet  3  . predniSONE (DELTASONE) 10 MG tablet TAKE ONE TABLET BY MOUTH ONE TIME DAILY  30 tablet  2  . simvastatin (ZOCOR) 20 MG tablet Take 20 mg by mouth at bedtime.        Marland Kitchen tiotropium (SPIRIVA) 18 MCG inhalation capsule Place 18 mcg into inhaler and inhale daily.        . nitroGLYCERIN (NITROSTAT) 0.4 MG SL tablet Place 0.4 mg under the tongue every 5 (five) minutes as needed. May repeat x3 For chest pain         REVIEW OF SYSTEMS: Please see history of present illness, no changes  PHYSICAL EXAMINATION:   Vital signs are BP 160/51  Pulse 79  Ht 5\' 7"  (1.702 m)  Wt 182 lb (82.555 kg)  BMI 28.51 kg/m2  SpO2 94% General: The patient appears their stated age. HEENT:  No gross abnormalities Pulmonary:  Non labored breathingr Musculoskeletal: There are no major deformities. Neurologic: No focal weakness or paresthesias are detected, Skin: There are no ulcer or rashes noted. Psychiatric: The patient has normal affect. Cardiovascular: Pedal pulses are not palpable   Diagnostic Studies Ultrasound was ordered and reviewed. This shows a decrease in his ankle brachial indices bilaterally. The right is now down to 0.31 the left is 0.22 these are decreased from over a month ago  Assessment: Worsening peripheral vascular disease Plan: Patient be scheduled for angiography tomorrow. He understands that he is a very poor percutaneous candidate for endovascular repair of his superficial femoral-popliteal disease. However, he may have options to treat his inflow. I will plan on accessing his right groin performing an aortogram with bilateral runoff and intervening if indicated.  Jorge Ny, M.D. [facility] [place] Office: [phone] Pager:  [phone]

## 2012-06-04 NOTE — Interval H&P Note (Signed)
History and Physical Interval Note:  06/04/2012 7:30 AM  [name]  has presented today for surgery, with the diagnosis of pvd  The various methods of treatment have been discussed with the patient and family. After consideration of risks, benefits and other options for treatment, the patient has consented to  Procedure(s) (LRB) with comments: ABDOMINAL AORTAGRAM (N/A) as a surgical intervention .  The patient's history has been reviewed, patient examined, no change in status, stable for surgery.  I have reviewed the patient's chart and labs.  Questions were answered to the patient's satisfaction.     Eline Geng IV, V. WELLS

## 2012-06-04 NOTE — Op Note (Signed)
[facility] [place]  Patient name: [name] MRN: [id number] DOB: 1935-10-30 Sex: male  06/04/2012 Pre-operative Diagnosis: Bilateral claudication, left greater than right Post-operative diagnosis:  Same Surgeon:  Jorge Ny Procedure Performed:  1.  ultrasound access right femoral artery  2.  abdominal aortogram  3.  bilateral lower extremity runoff  4.  second order catheterization (left external iliac artery)  5.  stent, right external iliac artery    Indications:  The patient has a history of bilateral iliac stenting. He comes in with worsening symptoms. The left leg is more bothersome than the right. Angiography with possible intervention was recommended.  Procedure:  The patient was identified in the holding area and taken to room 8.  The patient was then placed supine on the table and prepped and draped in the usual sterile fashion.  A time out was called.  Ultrasound was used to evaluate the right common femoral artery.  It was patent .  A digital ultrasound image was acquired.  A micropuncture needle was used to access the right common femoral artery under ultrasound guidance.  An 018 wire was advanced without resistance and a micropuncture sheath was placed.  The 018 wire was removed and a benson wire was placed.  The micropuncture sheath was exchanged for a 5 french sheath.  An omniflush catheter was advanced over the wire to the level of L-1.  An abdominal angiogram was obtained.  Next, using the omniflush catheter and a benson wire, the aortic bifurcation was crossed and the catheter was placed into theleft external iliac artery and left runoff was obtained.  right runoff was performed via retrograde sheath injections.  Findings:   Aortogram:  The visualized portions of the suprarenal abdominal aorta showed no significant stenosis. No significant renal artery stenosis is identified. The suprarenal and infrarenal aorta are heavily calcified as  are bilateral iliac vessels. The stent within the left common iliac artery is widely patent. The left external iliac and hypogastric arteries appear to be patent. However, there the beginning to have a common femoral stenosis within the distal left external iliac artery. The right common iliac artery is widely patent the right hypogastric is widely patent the stent within the proximal right external iliac artery is widely patent. There is a focal 80% stenosis within the distal right external iliac artery.  Right Lower Extremity:  Significant stenosis is identified within the right common femoral artery. The profunda femoral artery is patent throughout it's course. The superficial femoral artery is heavily calcified and is occluded at its distal extent. There is reconstitution of the popliteal artery. The peroneal and anterior tibial arteries are the dominant runoff. I did not obtain images of the right foot do to the amount of contrast that had been utilized.  Left Lower Extremity:  Significant stenosis is identified within the left common femoral artery with a stenosis greater than 90%. This affects filling of the profunda femoral and superficial femoral artery. The superficial femoral artery shows significant disease throughout it's course with several areas of high-grade stenosis. Popliteal artery is patent and there is posterior tibial artery as the dominant runoff vessel. The anterior tibial artery is patent down to the ankle.  Intervention:  After the above images were obtained, the decision was made to proceed with intervention. A 6 French bright tip sheath was inserted. I elected to primarily stented the lesion within the right external iliac artery. The patient was fully heparinized. Over a 035 wire,  I selected a 8 x 30 Cordis self-expanding stent. This was pulled across the lesion and molded to confirmation using a 7 x 20 Mustang balloon. A followup arteriogram revealed resolution of the stenosis. I  then removed the long sheath and placed a short 6 [place] sheath. The patient was taken to the holding area for sheath pull once his coagulation profile corrects.  Impression:  #1  no significant stenosis is identified within the left common iliac or right external iliac stent.  #2  successful stenting of a 80% stenosis within the right external iliac artery using a 8 x 30 Cordis stent  #3  high-grade, greater than 90% left common femoral artery stenosis  #4  approximately 60% stenosis within the right common femoral artery  #5  occluded right superficial femoral artery at the adductor canal, fused disease with several areas of high-grade stenosis within the left superficial femoral artery  #6  the patient will be brought back for left common femoral artery endarterectomy with possible femoral-popliteal bypass grafting versus intraoperative stenting   V. Durene Cal, M.D. [facility] Office: [phone] Pager:  [phone]

## 2012-06-07 ENCOUNTER — Encounter: Payer: Self-pay | Admitting: Surgery

## 2012-06-10 ENCOUNTER — Other Ambulatory Visit: Payer: Self-pay | Admitting: *Deleted

## 2012-06-10 ENCOUNTER — Encounter (INDEPENDENT_AMBULATORY_CARE_PROVIDER_SITE_OTHER): Payer: Medicare Other | Admitting: *Deleted

## 2012-06-10 ENCOUNTER — Telehealth: Payer: Self-pay | Admitting: Pulmonary Disease

## 2012-06-10 ENCOUNTER — Ambulatory Visit (INDEPENDENT_AMBULATORY_CARE_PROVIDER_SITE_OTHER): Payer: Medicare Other | Admitting: Surgery

## 2012-06-10 ENCOUNTER — Encounter: Payer: Self-pay | Admitting: Surgery

## 2012-06-10 VITALS — BP 146/54 | HR 78 | Ht 67.0 in | Wt 183.0 lb

## 2012-06-10 DIAGNOSIS — I70219 Atherosclerosis of native arteries of extremities with intermittent claudication, unspecified extremity: Secondary | ICD-10-CM

## 2012-06-10 DIAGNOSIS — Z01818 Encounter for other preprocedural examination: Secondary | ICD-10-CM

## 2012-06-10 DIAGNOSIS — I739 Peripheral vascular disease, unspecified: Secondary | ICD-10-CM

## 2012-06-10 DIAGNOSIS — Z01810 Encounter for preprocedural cardiovascular examination: Secondary | ICD-10-CM

## 2012-06-10 MED ORDER — PREDNISONE 10 MG PO TABS: 10.0000 mg | ORAL_TABLET | Freq: Every day | ORAL | Status: DC

## 2012-06-10 NOTE — Telephone Encounter (Signed)
Pt does not need to be seen again.  He is at significant risk for postop pulmonary complications,  But no contraindications.  If the surgery needs to be done, it needs to be done.

## 2012-06-10 NOTE — Progress Notes (Signed)
[facility] [place]   Patient name: [name] MRN: [id number] DOB: 1935/10/19 Sex: male     Chief Complaint  Patient presents with  . Re-evaluation    s/p abd aortogram, rt external iliac artery stent 06/04/2012    HISTORY OF PRESENT ILLNESS: Patient is back today for further evaluation of his left leg claudication. He underwent angiography on 06/04/2012. At that time he had a stent placed in his right external iliac artery. Diagnostic images were obtained of the left leg. I felt that he was not a candidate for percutaneous intervention on the left leg. He comes in today to discuss his options for surgical revascularization. His symptoms remain the same. He has no open wounds.  Past Medical History  Diagnosis Date  . Hypokalemia   . Colon cancer   . PVD (peripheral vascular disease)   . HTN (hypertension)   . Automatic implantable cardiac defibrillator in situ   . CHF (congestive heart failure)   . Ventricular tachycardia   . Ischemic heart disease, chronic   . Dyslipidemia   . CAD (coronary artery disease)   . COPD (chronic obstructive pulmonary disease)   . Emphysema   . On home oxygen therapy   . Difficult intubation   . Stroke     Past Surgical History  Procedure Date  . Lumbar back surgery     x2  . Cholecystectomy   . Iliac artery stent     left  . Implantation of medtronic concerto bi-v icd 04-10-08    [name] MD  . Colonoscopy   . Polypectomy   . Insertion of iliac stent 06/04/2012    right external     History   Social History  . Marital Status: Married    Spouse Name: N/A    Number of Children: N/A  . Years of Education: N/A   Occupational History  . Not on file.   Social History Main Topics  . Smoking status: Former Smoker -- 3.0 packs/day for 48 years    Types: Cigarettes    Quit date: 07/03/2002  . Smokeless tobacco: Former Neurosurgeon    Quit date: 07/11/2002  . Alcohol Use: Yes     Comment: occasional  . Drug  Use: No  . Sexually Active: Not on file   Other Topics Concern  . Not on file   Social History Narrative  . No narrative on file    Family History  Problem Relation Age of Onset  . Heart failure Father   . Heart disease Mother     before age 23  . Hyperlipidemia Mother   . Hypertension Mother     Allergies as of 06/10/2012  . (No Known Allergies)    Current Outpatient Prescriptions on File Prior to Visit  Medication Sig Dispense Refill  . albuterol (PROVENTIL HFA;VENTOLIN HFA) 108 (90 BASE) MCG/ACT inhaler Inhale 1-2 puffs into the lungs every 6 (six) hours as needed. Shortness of breath  3 Inhaler  3  . albuterol (PROVENTIL) (2.5 MG/3ML) 0.083% nebulizer solution Take 2.5 mg by nebulization 4 (four) times daily.       Marland Kitchen amLODipine (NORVASC) 5 MG tablet Take 5 mg by mouth daily.        Marland Kitchen aspirin 325 MG tablet Take 325 mg by mouth daily.      . budesonide (PULMICORT) 0.5 MG/2ML nebulizer solution Take 0.5 mg by nebulization 2 (two) times daily.       . candesartan (ATACAND) 32  MG tablet Take 32 mg by mouth daily.        . carvedilol (COREG) 12.5 MG tablet Take 12.5 mg by mouth 2 (two) times daily with a meal.        . digoxin (LANOXIN) 0.125 MG tablet Take 125 mcg by mouth daily.        . ferrous sulfate 325 (65 FE) MG tablet Take 325 mg by mouth daily with breakfast.      . furosemide (LASIX) 40 MG tablet Take 40-80 mg by mouth 2 (two) times daily. Take 2 tablets in the morning and 1 tablet in the evening      . guaiFENesin (MUCINEX) 600 MG 12 hr tablet Take 600 mg by mouth 2 (two) times daily as needed. congestion      . HYDROcodone-acetaminophen (VICODIN) 2.5-500 MG per tablet Take 1 tablet by mouth every 6 (six) hours as needed. Back pain      . Misc. Devices (ACAPELLA) MISC Use flutter valve as directed  1 each  0  . nitroGLYCERIN (NITRODUR - DOSED IN MG/24 HR) 0.2 mg/hr Place 1 patch onto the skin daily.       . nitroGLYCERIN (NITROSTAT) 0.4 MG SL tablet Place 0.4 mg under  the tongue every 5 (five) minutes as needed. May repeat x3 For chest pain      . potassium chloride SA (K-DUR,KLOR-CON) 20 MEQ tablet Take 1 tablet (20 mEq total) by mouth daily.  90 tablet  3  . predniSONE (DELTASONE) 10 MG tablet TAKE ONE TABLET BY MOUTH ONE TIME DAILY  30 tablet  2  . simvastatin (ZOCOR) 20 MG tablet Take 20 mg by mouth at bedtime.        Marland Kitchen tiotropium (SPIRIVA) 18 MCG inhalation capsule Place 18 mcg into inhaler and inhale daily.           REVIEW OF SYSTEMS: No changes  PHYSICAL EXAMINATION:   Vital signs are BP 146/54  Pulse 78  Ht 5\' 7"  (1.702 m)  Wt 183 lb (83.008 kg)  BMI 28.66 kg/m2  SpO2 100% General: The patient appears their stated age. HEENT:  No gross abnormalities Pulmonary:  Non labored breathing Musculoskeletal: There are no major deformities. Neurologic: No focal weakness or paresthesias are detected, Skin: There are no ulcer or rashes noted. Psychiatric: The patient has normal affect.   Diagnostic Studies Vein mapping was performed. The great saphenous vein on the left leg below the knee is surgically absent. Above the knee, it is of adequate diameter. The right saphenous vein is of adequate diameter.  Assessment: Left leg claudication Plan: Angiographic images revealed a high-grade left femoral stenosis. He also has additional disease within the left superficial femoral artery with a stenosis at the level of the knee. I discussed our options for revascularization. These include left femoral endarterectomy. This could also be combined with a bypass U. using contralateral saphenous vein or Gore-Tex. Because the patient suffers only from claudication and not rest pain or ulceration, and because of his underlying comorbidities which include home oxygen and coronary disease, I think the shortest way to improve his blood flow would be our best option. For that reason I have recommended proceeding with left femoral endarterectomy. He would like to have  this performed as soon as possible. I have scheduled for Friday, December 27. I will get formal cardiac clearance from Dr. Katrinka Blazing. He'll also have formal pulmonary clearance.  Jorge Ny, M.D. [facility] [place] Office: [phone]  Pager:  [phone]

## 2012-06-10 NOTE — Telephone Encounter (Signed)
Spoke w patient, requesting pred refill.  Rx sent in,patient aware and nothing further needed at this time.

## 2012-06-10 NOTE — Telephone Encounter (Signed)
Drinda Butts also calling from Dr.Brabhams office.  Drinda Butts states patient is to have a left femoral endorectomy done 06/28/12.  Drinda Butts would like to know since patient was seen 06/03/12 would thins be ok for a surgical clearance or would patient need to be seen again.  Dr. Shelle Iron please advise, thank you.

## 2012-06-10 NOTE — Telephone Encounter (Signed)
Spoke with patient, rx sent in and nothing further needed at this time

## 2012-06-10 NOTE — Addendum Note (Signed)
Addended by: Sharee Pimple on: 06/10/2012 10:47 AM   Modules accepted: Orders

## 2012-06-10 NOTE — Telephone Encounter (Signed)
ATC patient, no answer [id number] 

## 2012-06-10 NOTE — Telephone Encounter (Signed)
Drinda Butts requesting this statement in writing to be faxed to their office at [phone].  This note has been printed off and faxed.  Nothing further needed.

## 2012-06-13 ENCOUNTER — Encounter (HOSPITAL_COMMUNITY): Payer: Self-pay | Admitting: Pharmacy Technician

## 2012-06-18 ENCOUNTER — Encounter (HOSPITAL_COMMUNITY): Payer: Self-pay

## 2012-06-18 ENCOUNTER — Encounter (HOSPITAL_COMMUNITY)
Admission: RE | Admit: 2012-06-18 | Discharge: 2012-06-18 | Disposition: A | Discharge status: Home / Self Care | Payer: Medicare Other | Source: Ambulatory Visit | Attending: Surgery | Admitting: Surgery

## 2012-06-18 HISTORY — DX: Other specified health status: Z78.9

## 2012-06-18 LAB — URINALYSIS, ROUTINE W REFLEX MICROSCOPIC
Bilirubin Urine: NEGATIVE
Hgb urine dipstick: NEGATIVE
Ketones, ur: NEGATIVE mg/dL
Nitrite: NEGATIVE
Specific Gravity, Urine: 1.009 (ref 1.005–1.030)
pH: 6.5 (ref 5.0–8.0)

## 2012-06-18 LAB — COMPREHENSIVE METABOLIC PANEL
ALT: 12 U/L (ref 0–53)
AST: 16 U/L (ref 0–37)
Alkaline Phosphatase: 77 U/L (ref 39–117)
CO2: 31 mEq/L (ref 19–32)
GFR calc Af Amer: 74 mL/min — ABNORMAL LOW (ref >90)
Glucose, Bld: 113 mg/dL — ABNORMAL HIGH (ref 70–99)
Potassium: 4.3 mEq/L (ref 3.5–5.1)
Sodium: 137 mEq/L (ref 135–145)
Total Protein: 7.6 g/dL (ref 6.0–8.3)

## 2012-06-18 LAB — URINE MICROSCOPIC-ADD ON

## 2012-06-18 LAB — PROTIME-INR
INR: 1.09 (ref 0.00–1.49)
Prothrombin Time: 14 seconds (ref 11.6–15.2)

## 2012-06-18 LAB — APTT: aPTT: 31 seconds (ref 24–37)

## 2012-06-18 LAB — CBC
Hemoglobin: 10.3 g/dL — ABNORMAL LOW (ref 13.0–17.0)
MCHC: 31.4 g/dL (ref 30.0–36.0)
Platelets: 219 10*3/uL (ref 150–400)
RBC: 3.63 MIL/uL — ABNORMAL LOW (ref 4.22–5.81)

## 2012-06-18 NOTE — Pre-Procedure Instructions (Signed)
20 [name]  06/18/2012   Your procedure is scheduled on:  Jun 28, 2012 (Friday)  Report to [facility] at 5:30 AM.  Call this number if you have problems the morning of surgery: [phone]   Remember:   Do not eat food:After Midnight.  May have clear liquids:until Midnight .  Take these medicines the morning of surgery with A SIP OF WATER: albuterol, amlodipine(norvasc), budesonide(pulmicort), carvedilol(coreg), digoxin(lanoxin), tiotropium(spiriva), hydrocodone(vicodin)   Do not wear jewelry, make-up or nail polish.  Do not wear lotions, powders, or perfumes. You may wear deodorant.  Do not shave 48 hours prior to surgery. Men may shave face and neck.  Do not bring valuables to the hospital.  Contacts, dentures or bridgework may not be worn into surgery.  Leave suitcase in the car. After surgery it may be brought to your room.  For patients admitted to the hospital, checkout time is 11:00 AM the day of discharge.   Patients discharged the day of surgery will not be allowed to drive home.  Name and phone number of your driver:   Special Instructions: Shower using CHG 2 nights before surgery and the night before surgery.  If you shower the day of surgery use CHG.  Use special wash - you have one bottle of CHG for all showers.  You should use approximately 1/3 of the bottle for each shower.   Please read over the following fact sheets that you were given: Pain Booklet, Coughing and Deep Breathing and Surgical Site Infection Prevention

## 2012-06-18 NOTE — Progress Notes (Signed)
Pt states not aware of hx of difficult intubation, he said he has never been told that.  Revonda Standard, PA notified.

## 2012-06-20 NOTE — Consult Note (Addendum)
Anesthesia Chart Review:  Patient is a 76 year old male scheduled for left femoral endarterectomy on 06/28/12 by Dr. Myra Gianotti.  History includes former smoker, HTN, CAD s/p CABG '98, ischemic cardiomyopathy with EF < 20% (now 40-45% by 12/2011 echo) chronic systolic CHF, VT s/p Medtronic BiV ICD '09, PAF, COPD/emphysema with home O2 use, CVA, colon cancer s/p left hemicolectomy 03/09/2004, L4-5, L5-S1 PLIF 12/14/2006.  PCP is Dr. Sharlyn Bologna.   Difficult intubation was already listed in his anesthesia history, however patient denied this.  His anesthesia record from 12/14/2006 is on his chart for review.  A Blade 2 Miller and 8.0 ETT was used.  He was intubated with one attempt.  I do not see mention of difficult airway.  Pulmonologist is Dr. Shelle Iron.  He is aware of planned procedure and states, "He is at significant risk for postop pulmonary complications, But no contraindications. If the surgery needs to be done, it needs to be done."  Primary Cardiologist is Dr. Verdis Prime.  His preoperative assessment indicated, "CLEARED for general anesthesia and vascular surgery with anticipated high risk (>5%) of complications."  EP Cardiologist is Dr. [name].    EKG on 12/16/11 showed atrial sensed, v-paced.  Echo on 12/17/11 showed: - Left ventricle: The cavity size was mildly dilated. There was moderate concentric hypertrophy. Systolic function was mildly to moderately reduced. The estimated ejection fraction was in the range of 40% to 45%. Wall motion was normal; there were no regional wall motion abnormalities. - Ventricular septum: Septal motion showed mild dyssynergy. These changes are consistent with right ventricular pacing. - Mitral valve: Mild regurgitation. - Left atrium: The atrium was moderately to severely dilated. - Pulmonary arteries: Systolic pressure was mildly to moderately increased. PA peak pressure: 41mm Hg (S). By Dr. Michaelle Copas note, "Last echo in 12/2011 suggested an EF 40-45% which seems  to be higher than actual (30-35%).  He has not had a recent stress test or cardiac cath.  CXR on 07/12/11 showed: The lungs are clear without focal infiltrate, edema,  pneumothorax or pleural effusion. Interstitial markings are  diffusely coarsened with chronic features. Cardiopericardial  silhouette is at upper limits of normal for size.Left-sided pacer /  AICD remains in place nipple shadows project over each lower lung. Bones are diffusely demineralized.  Pre-operative labs noted.  He will need a T&S on arrival.  He has been cleared by both his pulmonologist and cardiologist, but with increased/high risk.  He will be evaluated by his assigned anesthesiologist on the day of surgery to discuss the definitve anesthesia plan.  Shonna Chock, PA-C 06/21/12 1153

## 2012-06-27 MED ORDER — DEXTROSE 5 % IV SOLN
1.5000 g | INTRAVENOUS | Status: AC
  Administered 2012-06-28: 1.5 g via INTRAVENOUS
  Filled 2012-06-27: qty 1.5

## 2012-06-27 MED ORDER — SODIUM CHLORIDE 0.9 % IV SOLN: INTRAVENOUS | Status: DC

## 2012-06-28 ENCOUNTER — Encounter (HOSPITAL_COMMUNITY): Payer: Self-pay | Admitting: Vascular Surgery

## 2012-06-28 ENCOUNTER — Telehealth: Payer: Self-pay | Admitting: Surgery

## 2012-06-28 ENCOUNTER — Inpatient Hospital Stay (HOSPITAL_COMMUNITY)
Admission: RE | Admit: 2012-06-28 | Discharge: 2012-06-29 | DRG: 253 | Disposition: A | Discharge status: Home / Self Care | Payer: Medicare Other | Source: Ambulatory Visit | Attending: Surgery | Admitting: Surgery

## 2012-06-28 ENCOUNTER — Encounter (HOSPITAL_COMMUNITY)
Admission: RE | Disposition: A | Discharge status: Home / Self Care | Payer: Self-pay | Source: Ambulatory Visit | Attending: Surgery

## 2012-06-28 ENCOUNTER — Inpatient Hospital Stay (HOSPITAL_COMMUNITY): Payer: Medicare Other | Admitting: Vascular Surgery

## 2012-06-28 ENCOUNTER — Encounter (HOSPITAL_COMMUNITY): Payer: Self-pay | Admitting: *Deleted

## 2012-06-28 DIAGNOSIS — I70219 Atherosclerosis of native arteries of extremities with intermittent claudication, unspecified extremity: Principal | ICD-10-CM | POA: Diagnosis present

## 2012-06-28 DIAGNOSIS — Z85038 Personal history of other malignant neoplasm of large intestine: Secondary | ICD-10-CM

## 2012-06-28 DIAGNOSIS — I472 Ventricular tachycardia, unspecified: Secondary | ICD-10-CM | POA: Diagnosis present

## 2012-06-28 DIAGNOSIS — I4729 Other ventricular tachycardia: Secondary | ICD-10-CM | POA: Diagnosis present

## 2012-06-28 DIAGNOSIS — I743 Embolism and thrombosis of arteries of the lower extremities: Secondary | ICD-10-CM

## 2012-06-28 DIAGNOSIS — I4891 Unspecified atrial fibrillation: Secondary | ICD-10-CM | POA: Diagnosis present

## 2012-06-28 DIAGNOSIS — Z01812 Encounter for preprocedural laboratory examination: Secondary | ICD-10-CM

## 2012-06-28 DIAGNOSIS — Z9981 Dependence on supplemental oxygen: Secondary | ICD-10-CM

## 2012-06-28 DIAGNOSIS — E785 Hyperlipidemia, unspecified: Secondary | ICD-10-CM | POA: Diagnosis present

## 2012-06-28 DIAGNOSIS — I2510 Atherosclerotic heart disease of native coronary artery without angina pectoris: Secondary | ICD-10-CM | POA: Diagnosis present

## 2012-06-28 DIAGNOSIS — Z7982 Long term (current) use of aspirin: Secondary | ICD-10-CM

## 2012-06-28 DIAGNOSIS — Z87891 Personal history of nicotine dependence: Secondary | ICD-10-CM

## 2012-06-28 DIAGNOSIS — J438 Other emphysema: Secondary | ICD-10-CM | POA: Diagnosis present

## 2012-06-28 DIAGNOSIS — I10 Essential (primary) hypertension: Secondary | ICD-10-CM | POA: Diagnosis present

## 2012-06-28 DIAGNOSIS — D62 Acute posthemorrhagic anemia: Secondary | ICD-10-CM | POA: Diagnosis not present

## 2012-06-28 DIAGNOSIS — I509 Heart failure, unspecified: Secondary | ICD-10-CM | POA: Diagnosis present

## 2012-06-28 DIAGNOSIS — Z95810 Presence of automatic (implantable) cardiac defibrillator: Secondary | ICD-10-CM

## 2012-06-28 DIAGNOSIS — Z79899 Other long term (current) drug therapy: Secondary | ICD-10-CM

## 2012-06-28 DIAGNOSIS — Z8673 Personal history of transient ischemic attack (TIA), and cerebral infarction without residual deficits: Secondary | ICD-10-CM

## 2012-06-28 DIAGNOSIS — IMO0002 Reserved for concepts with insufficient information to code with codable children: Secondary | ICD-10-CM

## 2012-06-28 HISTORY — PX: ENDARTERECTOMY FEMORAL: SHX5804

## 2012-06-28 HISTORY — PX: PATCH ANGIOPLASTY: SHX6230

## 2012-06-28 LAB — TYPE AND SCREEN
ABO/RH(D): O POS
Antibody Screen: NEGATIVE

## 2012-06-28 SURGERY — ENDARTERECTOMY, FEMORAL
Anesthesia: General | Site: Groin | Laterality: Left | Wound class: Clean

## 2012-06-28 MED ORDER — SIMVASTATIN 20 MG PO TABS
20.0000 mg | ORAL_TABLET | Freq: Every day | ORAL | Status: DC
  Administered 2012-06-28: 20 mg via ORAL
  Filled 2012-06-28 (×2): qty 1

## 2012-06-28 MED ORDER — OXYCODONE-ACETAMINOPHEN 5-325 MG PO TABS
1.0000 | ORAL_TABLET | ORAL | Status: DC | PRN
  Administered 2012-06-28 – 2012-06-29 (×3): 2 via ORAL
  Filled 2012-06-28 (×2): qty 2

## 2012-06-28 MED ORDER — FUROSEMIDE 40 MG PO TABS: 40.0000 mg | ORAL_TABLET | Freq: Two times a day (BID) | ORAL | Status: DC

## 2012-06-28 MED ORDER — SODIUM CHLORIDE 0.9 % IR SOLN
Status: DC | PRN
  Administered 2012-06-28: 08:00:00

## 2012-06-28 MED ORDER — PROTAMINE SULFATE 10 MG/ML IV SOLN
INTRAVENOUS | Status: DC | PRN
  Administered 2012-06-28: 50 mg via INTRAVENOUS

## 2012-06-28 MED ORDER — FUROSEMIDE 80 MG PO TABS
80.0000 mg | ORAL_TABLET | Freq: Every day | ORAL | Status: DC
  Administered 2012-06-29: 80 mg via ORAL
  Filled 2012-06-28 (×4): qty 1

## 2012-06-28 MED ORDER — ONDANSETRON HCL 4 MG/2ML IJ SOLN
INTRAMUSCULAR | Status: DC | PRN
  Administered 2012-06-28: 4 mg via INTRAVENOUS

## 2012-06-28 MED ORDER — SENNOSIDES-DOCUSATE SODIUM 8.6-50 MG PO TABS
1.0000 | ORAL_TABLET | Freq: Every evening | ORAL | Status: DC | PRN
  Filled 2012-06-28: qty 1

## 2012-06-28 MED ORDER — PROPOFOL 10 MG/ML IV BOLUS
INTRAVENOUS | Status: DC | PRN
  Administered 2012-06-28: 125 mg via INTRAVENOUS

## 2012-06-28 MED ORDER — CARVEDILOL 12.5 MG PO TABS
12.5000 mg | ORAL_TABLET | Freq: Two times a day (BID) | ORAL | Status: DC
  Administered 2012-06-29: 12.5 mg via ORAL
  Filled 2012-06-28 (×3): qty 1

## 2012-06-28 MED ORDER — DOCUSATE SODIUM 100 MG PO CAPS
100.0000 mg | ORAL_CAPSULE | Freq: Every day | ORAL | Status: DC
  Administered 2012-06-29: 100 mg via ORAL
  Filled 2012-06-28: qty 1

## 2012-06-28 MED ORDER — HEPARIN SODIUM (PORCINE) 1000 UNIT/ML IJ SOLN
INTRAMUSCULAR | Status: DC | PRN
  Administered 2012-06-28: 2000 [IU] via INTRAVENOUS
  Administered 2012-06-28: 8000 [IU] via INTRAVENOUS

## 2012-06-28 MED ORDER — LIDOCAINE HCL (CARDIAC) 20 MG/ML IV SOLN
INTRAVENOUS | Status: DC | PRN
  Administered 2012-06-28: 80 mg via INTRAVENOUS

## 2012-06-28 MED ORDER — DOPAMINE-DEXTROSE 3.2-5 MG/ML-% IV SOLN: 3.0000 ug/kg/min | INTRAVENOUS | Status: DC

## 2012-06-28 MED ORDER — AMLODIPINE BESYLATE 5 MG PO TABS
5.0000 mg | ORAL_TABLET | Freq: Every day | ORAL | Status: DC
  Administered 2012-06-29: 5 mg via ORAL
  Filled 2012-06-28: qty 1

## 2012-06-28 MED ORDER — GUAIFENESIN-DM 100-10 MG/5ML PO SYRP: 15.0000 mL | ORAL_SOLUTION | ORAL | Status: DC | PRN

## 2012-06-28 MED ORDER — 0.9 % SODIUM CHLORIDE (POUR BTL) OPTIME
TOPICAL | Status: DC | PRN
  Administered 2012-06-28: 2000 mL

## 2012-06-28 MED ORDER — PHENYLEPHRINE HCL 10 MG/ML IJ SOLN
INTRAMUSCULAR | Status: DC | PRN
  Administered 2012-06-28: 40 ug via INTRAVENOUS
  Administered 2012-06-28: 80 ug via INTRAVENOUS

## 2012-06-28 MED ORDER — METOPROLOL TARTRATE 1 MG/ML IV SOLN: 2.0000 mg | INTRAVENOUS | Status: DC | PRN

## 2012-06-28 MED ORDER — PREDNISONE 10 MG PO TABS
10.0000 mg | ORAL_TABLET | Freq: Every day | ORAL | Status: DC
  Administered 2012-06-29: 10 mg via ORAL
  Filled 2012-06-28: qty 1

## 2012-06-28 MED ORDER — PHENOL 1.4 % MT LIQD: 1.0000 | OROMUCOSAL | Status: DC | PRN

## 2012-06-28 MED ORDER — OXYCODONE HCL 5 MG/5ML PO SOLN: 5.0000 mg | Freq: Once | ORAL | Status: DC | PRN

## 2012-06-28 MED ORDER — ALUM & MAG HYDROXIDE-SIMETH 200-200-20 MG/5ML PO SUSP: 15.0000 mL | ORAL | Status: DC | PRN

## 2012-06-28 MED ORDER — PANTOPRAZOLE SODIUM 40 MG PO TBEC
40.0000 mg | DELAYED_RELEASE_TABLET | Freq: Every day | ORAL | Status: DC
  Administered 2012-06-28 – 2012-06-29 (×2): 40 mg via ORAL
  Filled 2012-06-28 (×2): qty 1

## 2012-06-28 MED ORDER — POTASSIUM CHLORIDE CRYS ER 20 MEQ PO TBCR
20.0000 meq | EXTENDED_RELEASE_TABLET | Freq: Every day | ORAL | Status: DC
  Administered 2012-06-28 – 2012-06-29 (×2): 20 meq via ORAL
  Filled 2012-06-28 (×2): qty 1

## 2012-06-28 MED ORDER — MORPHINE SULFATE 2 MG/ML IJ SOLN
INTRAMUSCULAR | Status: AC
  Filled 2012-06-28: qty 1

## 2012-06-28 MED ORDER — OXYCODONE-ACETAMINOPHEN 5-325 MG PO TABS
ORAL_TABLET | ORAL | Status: AC
  Filled 2012-06-28: qty 2

## 2012-06-28 MED ORDER — FENTANYL CITRATE 0.05 MG/ML IJ SOLN
INTRAMUSCULAR | Status: DC | PRN
  Administered 2012-06-28 (×3): 50 ug via INTRAVENOUS

## 2012-06-28 MED ORDER — ASPIRIN 325 MG PO TABS
325.0000 mg | ORAL_TABLET | Freq: Every day | ORAL | Status: DC
  Administered 2012-06-28 – 2012-06-29 (×2): 325 mg via ORAL
  Filled 2012-06-28 (×2): qty 1

## 2012-06-28 MED ORDER — MIDAZOLAM HCL 5 MG/5ML IJ SOLN
INTRAMUSCULAR | Status: DC | PRN
  Administered 2012-06-28: 2 mg via INTRAVENOUS

## 2012-06-28 MED ORDER — HYDROMORPHONE HCL PF 1 MG/ML IJ SOLN: 0.2500 mg | INTRAMUSCULAR | Status: DC | PRN

## 2012-06-28 MED ORDER — ALBUTEROL SULFATE (5 MG/ML) 0.5% IN NEBU
2.5000 mg | INHALATION_SOLUTION | RESPIRATORY_TRACT | Status: DC | PRN
  Administered 2012-06-28 – 2012-06-29 (×2): 2.5 mg via RESPIRATORY_TRACT
  Filled 2012-06-28 (×2): qty 0.5

## 2012-06-28 MED ORDER — DEXTROSE 5 % IV SOLN
1.5000 g | Freq: Two times a day (BID) | INTRAVENOUS | Status: AC
  Administered 2012-06-28 – 2012-06-29 (×2): 1.5 g via INTRAVENOUS
  Filled 2012-06-28 (×2): qty 1.5

## 2012-06-28 MED ORDER — OXYCODONE HCL 5 MG PO TABS: 5.0000 mg | ORAL_TABLET | Freq: Once | ORAL | Status: DC | PRN

## 2012-06-28 MED ORDER — BUDESONIDE 0.5 MG/2ML IN SUSP
0.5000 mg | Freq: Two times a day (BID) | RESPIRATORY_TRACT | Status: DC
  Administered 2012-06-28 – 2012-06-29 (×2): 0.5 mg via RESPIRATORY_TRACT
  Filled 2012-06-28 (×3): qty 2

## 2012-06-28 MED ORDER — FERROUS SULFATE 325 (65 FE) MG PO TABS
325.0000 mg | ORAL_TABLET | Freq: Every day | ORAL | Status: DC
  Administered 2012-06-29: 325 mg via ORAL
  Filled 2012-06-28 (×2): qty 1

## 2012-06-28 MED ORDER — NITROGLYCERIN 0.4 MG SL SUBL: 0.4000 mg | SUBLINGUAL_TABLET | SUBLINGUAL | Status: DC | PRN

## 2012-06-28 MED ORDER — GUAIFENESIN ER 600 MG PO TB12
600.0000 mg | ORAL_TABLET | Freq: Two times a day (BID) | ORAL | Status: DC | PRN
  Filled 2012-06-28: qty 1

## 2012-06-28 MED ORDER — PROMETHAZINE HCL 25 MG/ML IJ SOLN: 6.2500 mg | INTRAMUSCULAR | Status: DC | PRN

## 2012-06-28 MED ORDER — ROCURONIUM BROMIDE 100 MG/10ML IV SOLN
INTRAVENOUS | Status: DC | PRN
  Administered 2012-06-28: 50 mg via INTRAVENOUS

## 2012-06-28 MED ORDER — MAGNESIUM SULFATE 40 MG/ML IJ SOLN
2.0000 g | Freq: Once | INTRAMUSCULAR | Status: AC | PRN
  Filled 2012-06-28: qty 50

## 2012-06-28 MED ORDER — TIOTROPIUM BROMIDE MONOHYDRATE 18 MCG IN CAPS
18.0000 ug | ORAL_CAPSULE | Freq: Every day | RESPIRATORY_TRACT | Status: DC
  Administered 2012-06-29: 18 ug via RESPIRATORY_TRACT
  Filled 2012-06-28: qty 5

## 2012-06-28 MED ORDER — HEMOSTATIC AGENTS (NO CHARGE) OPTIME
TOPICAL | Status: DC | PRN
  Administered 2012-06-28: 1 via TOPICAL

## 2012-06-28 MED ORDER — BISACODYL 10 MG RE SUPP: 10.0000 mg | Freq: Every day | RECTAL | Status: DC | PRN

## 2012-06-28 MED ORDER — IRBESARTAN 300 MG PO TABS
300.0000 mg | ORAL_TABLET | Freq: Every day | ORAL | Status: DC
  Administered 2012-06-29: 300 mg via ORAL
  Filled 2012-06-28: qty 1

## 2012-06-28 MED ORDER — NITROGLYCERIN 0.2 MG/HR TD PT24
0.2000 mg | MEDICATED_PATCH | Freq: Every day | TRANSDERMAL | Status: DC
  Administered 2012-06-29: 0.2 mg via TRANSDERMAL
  Filled 2012-06-28: qty 1

## 2012-06-28 MED ORDER — SODIUM CHLORIDE 0.9 % IV SOLN: 500.0000 mL | Freq: Once | INTRAVENOUS | Status: AC | PRN

## 2012-06-28 MED ORDER — LABETALOL HCL 5 MG/ML IV SOLN: 10.0000 mg | INTRAVENOUS | Status: DC | PRN

## 2012-06-28 MED ORDER — POTASSIUM CHLORIDE CRYS ER 20 MEQ PO TBCR: 20.0000 meq | EXTENDED_RELEASE_TABLET | Freq: Once | ORAL | Status: AC | PRN

## 2012-06-28 MED ORDER — MIDAZOLAM HCL 2 MG/2ML IJ SOLN: 1.0000 mg | INTRAMUSCULAR | Status: DC | PRN

## 2012-06-28 MED ORDER — HYDRALAZINE HCL 20 MG/ML IJ SOLN: 10.0000 mg | INTRAMUSCULAR | Status: DC | PRN

## 2012-06-28 MED ORDER — ACETAMINOPHEN 650 MG RE SUPP: 325.0000 mg | RECTAL | Status: DC | PRN

## 2012-06-28 MED ORDER — POTASSIUM CHLORIDE IN NACL 20-0.9 MEQ/L-% IV SOLN
INTRAVENOUS | Status: DC
  Administered 2012-06-28: 18:00:00 via INTRAVENOUS
  Filled 2012-06-28 (×4): qty 1000

## 2012-06-28 MED ORDER — ALBUTEROL SULFATE HFA 108 (90 BASE) MCG/ACT IN AERS: 1.0000 | INHALATION_SPRAY | Freq: Four times a day (QID) | RESPIRATORY_TRACT | Status: DC | PRN

## 2012-06-28 MED ORDER — DIGOXIN 125 MCG PO TABS
125.0000 ug | ORAL_TABLET | Freq: Every day | ORAL | Status: DC
  Administered 2012-06-29: 125 ug via ORAL
  Filled 2012-06-28: qty 1

## 2012-06-28 MED ORDER — ACETAMINOPHEN 325 MG PO TABS: 325.0000 mg | ORAL_TABLET | ORAL | Status: DC | PRN

## 2012-06-28 MED ORDER — LACTATED RINGERS IV SOLN
INTRAVENOUS | Status: DC | PRN
  Administered 2012-06-28 (×2): via INTRAVENOUS

## 2012-06-28 MED ORDER — ONDANSETRON HCL 4 MG/2ML IJ SOLN: 4.0000 mg | Freq: Four times a day (QID) | INTRAMUSCULAR | Status: DC | PRN

## 2012-06-28 MED ORDER — MORPHINE SULFATE 2 MG/ML IJ SOLN
2.0000 mg | INTRAMUSCULAR | Status: DC | PRN
  Administered 2012-06-28: 2 mg via INTRAVENOUS

## 2012-06-28 MED ORDER — FUROSEMIDE 40 MG PO TABS
40.0000 mg | ORAL_TABLET | Freq: Every day | ORAL | Status: DC
  Administered 2012-06-28: 40 mg via ORAL
  Filled 2012-06-28 (×2): qty 1

## 2012-06-28 MED ORDER — FENTANYL CITRATE 0.05 MG/ML IJ SOLN: 50.0000 ug | Freq: Once | INTRAMUSCULAR | Status: DC

## 2012-06-28 SURGICAL SUPPLY — 61 items
ADH SKN CLS APL DERMABOND .7 (GAUZE/BANDAGES/DRESSINGS) ×1
BANDAGE ELASTIC 4 VELCRO ST LF (GAUZE/BANDAGES/DRESSINGS) IMPLANT
CANISTER SUCTION 2500CC (MISCELLANEOUS) ×2 IMPLANT
CLIP TI MEDIUM 24 (CLIP) ×2 IMPLANT
CLIP TI WIDE RED SMALL 24 (CLIP) ×2 IMPLANT
CLOTH BEACON ORANGE TIMEOUT ST (SAFETY) ×2 IMPLANT
COVER SURGICAL LIGHT HANDLE (MISCELLANEOUS) ×2 IMPLANT
DERMABOND ADVANCED (GAUZE/BANDAGES/DRESSINGS) ×1
DERMABOND ADVANCED .7 DNX12 (GAUZE/BANDAGES/DRESSINGS) ×1 IMPLANT
DRAIN CHANNEL 15F RND FF W/TCR (WOUND CARE) IMPLANT
DRAPE WARM FLUID 44X44 (DRAPE) ×2 IMPLANT
DRAPE X-RAY CASS 24X20 (DRAPES) IMPLANT
DRSG COVADERM 4X10 (GAUZE/BANDAGES/DRESSINGS) IMPLANT
DRSG COVADERM 4X8 (GAUZE/BANDAGES/DRESSINGS) IMPLANT
ELECT REM PT RETURN 9FT ADLT (ELECTROSURGICAL) ×2
ELECTRODE REM PT RTRN 9FT ADLT (ELECTROSURGICAL) ×1 IMPLANT
EVACUATOR SILICONE 100CC (DRAIN) IMPLANT
GLOVE BIO SURGEON STRL SZ 6.5 (GLOVE) ×1 IMPLANT
GLOVE BIOGEL PI IND STRL 6.5 (GLOVE) IMPLANT
GLOVE BIOGEL PI IND STRL 7.0 (GLOVE) IMPLANT
GLOVE BIOGEL PI IND STRL 7.5 (GLOVE) ×1 IMPLANT
GLOVE BIOGEL PI INDICATOR 6.5 (GLOVE) ×4
GLOVE BIOGEL PI INDICATOR 7.0 (GLOVE) ×3
GLOVE BIOGEL PI INDICATOR 7.5 (GLOVE) ×2
GLOVE SS BIOGEL STRL SZ 6.5 (GLOVE) IMPLANT
GLOVE SS BIOGEL STRL SZ 7 (GLOVE) IMPLANT
GLOVE SUPERSENSE BIOGEL SZ 6.5 (GLOVE) ×4
GLOVE SUPERSENSE BIOGEL SZ 7 (GLOVE) ×1
GLOVE SURG SS PI 7.0 STRL IVOR (GLOVE) ×3 IMPLANT
GLOVE SURG SS PI 7.5 STRL IVOR (GLOVE) ×2 IMPLANT
GOWN PREVENTION PLUS XXLARGE (GOWN DISPOSABLE) ×2 IMPLANT
GOWN STRL NON-REIN LRG LVL3 (GOWN DISPOSABLE) ×5 IMPLANT
GOWN STRL REIN XL XLG (GOWN DISPOSABLE) ×5 IMPLANT
HEMOSTAT SNOW SURGICEL 2X4 (HEMOSTASIS) ×1 IMPLANT
HEMOSTAT SURGICEL 2X14 (HEMOSTASIS) IMPLANT
KIT BASIN OR (CUSTOM PROCEDURE TRAY) ×2 IMPLANT
KIT ROOM TURNOVER OR (KITS) ×2 IMPLANT
NS IRRIG 1000ML POUR BTL (IV SOLUTION) ×4 IMPLANT
PACK PERIPHERAL VASCULAR (CUSTOM PROCEDURE TRAY) ×2 IMPLANT
PAD ARMBOARD 7.5X6 YLW CONV (MISCELLANEOUS) ×4 IMPLANT
PATCH HEMASHIELD 8X150 (Vascular Products) ×1 IMPLANT
SET COLLECT BLD 21X3/4 12 (NEEDLE) IMPLANT
SLEEVE SURGEON STRL (DRAPES) ×2 IMPLANT
SPONGE INTESTINAL PEANUT (DISPOSABLE) ×2 IMPLANT
STAPLER VISISTAT 35W (STAPLE) IMPLANT
STOPCOCK 4 WAY LG BORE MALE ST (IV SETS) IMPLANT
SUT ETHILON 3 0 PS 1 (SUTURE) IMPLANT
SUT PROLENE 5 0 C 1 24 (SUTURE) ×2 IMPLANT
SUT PROLENE 6 0 BV (SUTURE) ×6 IMPLANT
SUT PROLENE 7 0 BV1 MDA (SUTURE) ×1 IMPLANT
SUT VIC AB 2-0 CT1 27 (SUTURE) ×4
SUT VIC AB 2-0 CT1 TAPERPNT 27 (SUTURE) ×1 IMPLANT
SUT VIC AB 3-0 SH 27 (SUTURE) ×2
SUT VIC AB 3-0 SH 27X BRD (SUTURE) ×1 IMPLANT
SUT VICRYL 4-0 PS2 18IN ABS (SUTURE) ×1 IMPLANT
TOWEL OR 17X24 6PK STRL BLUE (TOWEL DISPOSABLE) ×4 IMPLANT
TOWEL OR 17X26 10 PK STRL BLUE (TOWEL DISPOSABLE) ×2 IMPLANT
TRAY FOLEY CATH 14FRSI W/METER (CATHETERS) ×2 IMPLANT
TUBING EXTENTION W/L.L. (IV SETS) IMPLANT
UNDERPAD 30X30 INCONTINENT (UNDERPADS AND DIAPERS) ×2 IMPLANT
WATER STERILE IRR 1000ML POUR (IV SOLUTION) ×2 IMPLANT

## 2012-06-28 NOTE — Anesthesia Postprocedure Evaluation (Signed)
  Anesthesia Post-op Note  Patient: [name]  Procedure(s) Performed: Procedure(s) (LRB) with comments: ENDARTERECTOMY FEMORAL (Left) PATCH ANGIOPLASTY (Left) - Using 0.8 cm x 15.2 cm Hemashield Patch.  Patient Location: PACU  Anesthesia Type:General  Level of Consciousness: awake  Airway and Oxygen Therapy: Patient Spontanous Breathing  Post-op Pain: mild  Post-op Assessment: Post-op Vital signs reviewed, Patient's Cardiovascular Status Stable, Respiratory Function Stable, Patent Airway, No signs of Nausea or vomiting and Pain level controlled  Post-op Vital Signs: stable  Complications: No apparent anesthesia complications

## 2012-06-28 NOTE — Interval H&P Note (Signed)
History and Physical Interval Note:  06/28/2012 7:23 AM  [name]  has presented today for surgery, with the diagnosis of CLAUDICATION  The various methods of treatment have been discussed with the patient and family. After consideration of risks, benefits and other options for treatment, the patient has consented to  Procedure(s) (LRB) with comments: ENDARTERECTOMY FEMORAL (Left) as a surgical intervention .  The patient's history has been reviewed, patient examined, no change in status, stable for surgery.  I have reviewed the patient's chart and labs.  Questions were answered to the patient's satisfaction.     BRABHAM IV, V. WELLS

## 2012-06-28 NOTE — OR Nursing (Signed)
Tolerated entire salad/meal

## 2012-06-28 NOTE — Op Note (Signed)
[facility] [place]  Patient name: [name] MRN: [id number] DOB: Dec 26, 1935 Sex: male  06/28/2012 Pre-operative Diagnosis: Left leg claudication Post-operative diagnosis:  Same Surgeon:  Jorge Ny Assistants:  Narda Amber Procedure:   Left common femoral, superficial femoral, and profunda femoral endarterectomy with Dacron patch angioplasty Anesthesia:  Gen. Blood Loss:  See anesthesia record Specimens:  Femoral plaque  Findings:  Extensive calcification with coral reef type plaque from the distal external iliac artery down throughout the entire superficial femoral artery. The endarterectomy was truncated approximately 3 cm distal to the superficial femoral origin. The plaque at the femoral bifurcation extending into the superficial femoral artery was nearly occlusive  Indications:  The patient is having significant claudication symptoms in his left leg which he can no longer tolerate. He had similar symptoms in the right leg which were alleviated with stenting. Due to the location of the stenosis in the common femoral, superficial femoral, and profunda femoral artery, he is not a candidate for percutaneous intervention. We discussed multiple options but because of his underlying comorbidities, we have elected to proceed with femoral endarterectomy.  Procedure:  The patient was identified in the holding area and taken to [facility] OR ROOM 16  The patient was then placed supine on the table. general anesthesia was administered.  The patient was prepped and draped in the usual sterile fashion.  A time out was called and antibiotics were administered.  A longitudinal incision was made in the left groin. Cautery was used to divide the subcutaneous tissue down to the femoral sheath. The femoral sheath was opened sharply. The common femoral artery was exposed up underneath the inguinal ligament. The superficial femoral artery was exposed for proximally 5 cm. Branches of  the profunda femoral artery were exposed. There was dense calcification present. Once adequate exposure was obtained, the patient was fully heparinized. After the heparin had circulated, the arteries were occluded with vascular clamps. A #11 blade was used to make an arteriotomy which was extended to the proximal common femoral artery down 3 cm onto the superficial femoral artery. In the proximal superficial femoral and distal common femoral artery there was near occlusive plaque. I performed endarterectomy with a Ladoris Gene. An eversion type endarterectomy was performed in the profunda femoral artery. Once all of the plaque was removed, the endarterectomized bed was copiously irrigated. All potential embolic debris was removed. Because of the length of the arteriotomy, I selected a dacryon patch. Patch angioplasty was performed using a running 5-0 Prolene. Prior to completion of the patch, the appropriate flushing maneuvers were performed. The artery was irrigated with heparinized saline. The patch repair was then completed. Vascular clamps were then removed first from the profunda, then the distal external iliac, and finally the superficial femoral artery. Hand-held Doppler was used to evaluate blood flow in the profunda and superficial femoral artery. These both had multiphasic signals. 50 mg of protamine was administered. Once hemostasis was adequate, the femoral sheath was reapproximated with 2-0 Vicryl. The subcutaneous tissue was then closed in multiple layers of 20 and 3-0 Vicryl. The skin was closed with 3-0 Vicryl. Dermabond placed on the wound. The patient was successfully extubated and taken to the recovery room in stable condition.   Disposition:  To PACU in stable condition.   Juleen China, M.D. [facility] [place] Office: [phone] Pager:  [phone]

## 2012-06-28 NOTE — H&P (View-Only) (Signed)
[facility] [place]   Patient name: [name] MRN: [id number] DOB: 11/18/1935 Sex: male     Chief Complaint  Patient presents with  . Re-evaluation    s/p abd aortogram, rt external iliac artery stent 06/04/2012    HISTORY OF PRESENT ILLNESS: Patient is back today for further evaluation of his left leg claudication. He underwent angiography on 06/04/2012. At that time he had a stent placed in his right external iliac artery. Diagnostic images were obtained of the left leg. I felt that he was not a candidate for percutaneous intervention on the left leg. He comes in today to discuss his options for surgical revascularization. His symptoms remain the same. He has no open wounds.  Past Medical History  Diagnosis Date  . Hypokalemia   . Colon cancer   . PVD (peripheral vascular disease)   . HTN (hypertension)   . Automatic implantable cardiac defibrillator in situ   . CHF (congestive heart failure)   . Ventricular tachycardia   . Ischemic heart disease, chronic   . Dyslipidemia   . CAD (coronary artery disease)   . COPD (chronic obstructive pulmonary disease)   . Emphysema   . On home oxygen therapy   . Difficult intubation   . Stroke     Past Surgical History  Procedure Date  . Lumbar back surgery     x2  . Cholecystectomy   . Iliac artery stent     left  . Implantation of medtronic concerto bi-v icd 04-10-08    Gregg Taylor MD  . Colonoscopy   . Polypectomy   . Insertion of iliac stent 06/04/2012    right external     History   Social History  . Marital Status: Married    Spouse Name: N/A    Number of Children: N/A  . Years of Education: N/A   Occupational History  . Not on file.   Social History Main Topics  . Smoking status: Former Smoker -- 3.0 packs/day for 48 years    Types: Cigarettes    Quit date: 07/03/2002  . Smokeless tobacco: Former User    Quit date: 07/11/2002  . Alcohol Use: Yes     Comment: occasional  . Drug  Use: No  . Sexually Active: Not on file   Other Topics Concern  . Not on file   Social History Narrative  . No narrative on file    Family History  Problem Relation Age of Onset  . Heart failure Father   . Heart disease Mother     before age 60  . Hyperlipidemia Mother   . Hypertension Mother     Allergies as of 06/10/2012  . (No Known Allergies)    Current Outpatient Prescriptions on File Prior to Visit  Medication Sig Dispense Refill  . albuterol (PROVENTIL HFA;VENTOLIN HFA) 108 (90 BASE) MCG/ACT inhaler Inhale 1-2 puffs into the lungs every 6 (six) hours as needed. Shortness of breath  3 Inhaler  3  . albuterol (PROVENTIL) (2.5 MG/3ML) 0.083% nebulizer solution Take 2.5 mg by nebulization 4 (four) times daily.       . amLODipine (NORVASC) 5 MG tablet Take 5 mg by mouth daily.        . aspirin 325 MG tablet Take 325 mg by mouth daily.      . budesonide (PULMICORT) 0.5 MG/2ML nebulizer solution Take 0.5 mg by nebulization 2 (two) times daily.       . candesartan (ATACAND) 32   MG tablet Take 32 mg by mouth daily.        . carvedilol (COREG) 12.5 MG tablet Take 12.5 mg by mouth 2 (two) times daily with a meal.        . digoxin (LANOXIN) 0.125 MG tablet Take 125 mcg by mouth daily.        . ferrous sulfate 325 (65 FE) MG tablet Take 325 mg by mouth daily with breakfast.      . furosemide (LASIX) 40 MG tablet Take 40-80 mg by mouth 2 (two) times daily. Take 2 tablets in the morning and 1 tablet in the evening      . guaiFENesin (MUCINEX) 600 MG 12 hr tablet Take 600 mg by mouth 2 (two) times daily as needed. congestion      . HYDROcodone-acetaminophen (VICODIN) 2.5-500 MG per tablet Take 1 tablet by mouth every 6 (six) hours as needed. Back pain      . Misc. Devices (ACAPELLA) MISC Use flutter valve as directed  1 each  0  . nitroGLYCERIN (NITRODUR - DOSED IN MG/24 HR) 0.2 mg/hr Place 1 patch onto the skin daily.       . nitroGLYCERIN (NITROSTAT) 0.4 MG SL tablet Place 0.4 mg under  the tongue every 5 (five) minutes as needed. May repeat x3 For chest pain      . potassium chloride SA (K-DUR,KLOR-CON) 20 MEQ tablet Take 1 tablet (20 mEq total) by mouth daily.  90 tablet  3  . predniSONE (DELTASONE) 10 MG tablet TAKE ONE TABLET BY MOUTH ONE TIME DAILY  30 tablet  2  . simvastatin (ZOCOR) 20 MG tablet Take 20 mg by mouth at bedtime.        . tiotropium (SPIRIVA) 18 MCG inhalation capsule Place 18 mcg into inhaler and inhale daily.           REVIEW OF SYSTEMS: No changes  PHYSICAL EXAMINATION:   Vital signs are BP 146/54  Pulse 78  Ht 5' 7" (1.702 m)  Wt 183 lb (83.008 kg)  BMI 28.66 kg/m2  SpO2 100% General: The patient appears their stated age. HEENT:  No gross abnormalities Pulmonary:  Non labored breathing Musculoskeletal: There are no major deformities. Neurologic: No focal weakness or paresthesias are detected, Skin: There are no ulcer or rashes noted. Psychiatric: The patient has normal affect.   Diagnostic Studies Vein mapping was performed. The great saphenous vein on the left leg below the knee is surgically absent. Above the knee, it is of adequate diameter. The right saphenous vein is of adequate diameter.  Assessment: Left leg claudication Plan: Angiographic images revealed a high-grade left femoral stenosis. He also has additional disease within the left superficial femoral artery with a stenosis at the level of the knee. I discussed our options for revascularization. These include left femoral endarterectomy. This could also be combined with a bypass U. using contralateral saphenous vein or Gore-Tex. Because the patient suffers only from claudication and not rest pain or ulceration, and because of his underlying comorbidities which include home oxygen and coronary disease, I think the shortest way to improve his blood flow would be our best option. For that reason I have recommended proceeding with left femoral endarterectomy. He would like to have  this performed as soon as possible. I have scheduled for Friday, December 27. I will get formal cardiac clearance from Dr. Smith. He'll also have formal pulmonary clearance.  V. Wells Jaidev Sanger IV, M.D. [facility] [place] Office: [phone]   Pager:  [phone]   

## 2012-06-28 NOTE — Anesthesia Preprocedure Evaluation (Signed)
Anesthesia Evaluation  Patient identified by MRN, date of birth, ID band Patient awake    Reviewed: Allergy & Precautions, H&P , NPO status , Patient's Chart, lab work & pertinent test results  History of Anesthesia Complications (+) DIFFICULT AIRWAY  Airway Mallampati: II TM Distance: <3 FB Neck ROM: Full    Dental   Pulmonary COPDformer smoker,  breath sounds clear to auscultation        Cardiovascular hypertension, + CAD and + Peripheral Vascular Disease + dysrhythmias Atrial Fibrillation and Ventricular Tachycardia + Cardiac Defibrillator Rhythm:Regular Rate:Normal     Neuro/Psych TIACVA    GI/Hepatic   Endo/Other    Renal/GU      Musculoskeletal   Abdominal   Peds  Hematology   Anesthesia Other Findings   Reproductive/Obstetrics                           Anesthesia Physical Anesthesia Plan  ASA: IV  Anesthesia Plan: General   Post-op Pain Management:    Induction: Intravenous  Airway Management Planned: Oral ETT  Additional Equipment: Arterial line  Intra-op Plan:   Post-operative Plan: Extubation in OR  Informed Consent: I have reviewed the patients History and Physical, chart, labs and discussed the procedure including the risks, benefits and alternatives for the proposed anesthesia with the patient or authorized representative who has indicated his/her understanding and acceptance.     Plan Discussed with: CRNA and Surgeon  Anesthesia Plan Comments:         Anesthesia Quick Evaluation

## 2012-06-28 NOTE — Transfer of Care (Signed)
Immediate Anesthesia Transfer of Care Note  Patient: [name]  Procedure(s) Performed: Procedure(s) (LRB) with comments: ENDARTERECTOMY FEMORAL (Left) PATCH ANGIOPLASTY (Left) - Using 0.8 cm x 15.2 cm Hemashield Patch.  Patient Location: PACU  Anesthesia Type:General  Level of Consciousness: awake, alert  and oriented  Airway & Oxygen Therapy: Patient Spontanous Breathing and Patient connected to nasal cannula oxygen  Post-op Assessment: Report given to PACU RN, Post -op Vital signs reviewed and stable and Patient moving all extremities  Post vital signs: Reviewed and stable  Complications: No apparent anesthesia complications

## 2012-06-28 NOTE — Telephone Encounter (Signed)
Message copied by Margaretmary Eddy on Fri Jun 28, 2012  3:48 PM ------      Message from: Sharee Pimple      Created: Fri Jun 28, 2012  2:39 PM      Regarding: schedule                   ----- Message -----         From: Dara Lords, PA         Sent: 06/28/2012   2:23 PM           To: Sharee Pimple, CMA            S/p Left common femoral, superficial femoral, and profunda femoral endarterectomy with Dacron patch angioplasty      By Dr. Myra Gianotti 06/28/12.  F/u in 2 weeks.            Thanks,      Lelon Mast

## 2012-06-28 NOTE — Anesthesia Procedure Notes (Signed)
Procedure Name: Intubation Date/Time: 06/28/2012 7:45 AM Performed by: Rossie Muskrat L Pre-anesthesia Checklist: Patient identified, Timeout performed, Emergency Drugs available, Suction available and Patient being monitored Patient Re-evaluated:Patient Re-evaluated prior to inductionOxygen Delivery Method: Circle system utilized Preoxygenation: Pre-oxygenation with 100% oxygen Intubation Type: IV induction Ventilation: Mask ventilation without difficulty Laryngoscope Size: Miller and 2 Grade View: Grade I Tube type: Oral Tube size: 7.5 mm Number of attempts: 1 Airway Equipment and Method: Stylet Placement Confirmation: ETT inserted through vocal cords under direct vision,  breath sounds checked- equal and bilateral and positive ETCO2 Secured at: 22 cm Tube secured with: Tape Dental Injury: Teeth and Oropharynx as per pre-operative assessment

## 2012-06-28 NOTE — Preoperative (Signed)
Beta Blockers   Reason not to administer Beta Blockers:B blocker taken 06/28/12 @0330 

## 2012-06-29 LAB — CBC
HCT: 27.8 % — ABNORMAL LOW (ref 39.0–52.0)
MCH: 28.7 pg (ref 26.0–34.0)
MCHC: 31.7 g/dL (ref 30.0–36.0)
MCV: 90.6 fL (ref 78.0–100.0)
Platelets: 172 10*3/uL (ref 150–400)
RDW: 15.3 % (ref 11.5–15.5)
WBC: 8.2 10*3/uL (ref 4.0–10.5)

## 2012-06-29 LAB — BASIC METABOLIC PANEL
BUN: 24 mg/dL — ABNORMAL HIGH (ref 6–23)
Calcium: 8.6 mg/dL (ref 8.4–10.5)
Chloride: 99 mEq/L (ref 96–112)
Creatinine, Ser: 1.31 mg/dL (ref 0.50–1.35)
GFR calc Af Amer: 60 mL/min — ABNORMAL LOW (ref >90)
GFR calc non Af Amer: 52 mL/min — ABNORMAL LOW (ref >90)

## 2012-06-29 NOTE — Discharge Summary (Signed)
[facility] Discharge Summary  [name] [birth year] 76 y.o. male  [id number]  Admission Date: 06/28/2012  Discharge Date: 06/29/12  Physician: Nada Libman, MD  Admission Diagnosis: CLAUDICATION   HPI:   This is a 76 y.o. male is back today for further evaluation of his left leg claudication. He underwent angiography on 06/04/2012. At that time he had a stent placed in his right external iliac artery. Diagnostic images were obtained of the left leg. I felt that he was not a candidate for percutaneous intervention on the left leg. He comes in today to discuss his options for surgical revascularization. His symptoms remain the same. He has no open wounds.  Hospital Course:  The patient was admitted to the hospital and taken to the operating room on 06/28/2012 and underwent  Left common femoral, superficial femoral, and profunda femoral endarterectomy with Dacron patch angioplasty    The pt tolerated the procedure well and was transported to the PACU in good condition. By POD 1, he was doing quite well and had ambulated the entire unit without pain or difficulty.  He self catheterizes at home.  He is discharged POD 1.  The remainder of the hospital course consisted of increasing mobilization and increasing intake of solids without difficulty.  CBC    Component Value Date/Time   WBC 8.2 06/29/2012 0425   WBC 13.1* 08/30/2009 0852   RBC 3.07* 06/29/2012 0425   RBC 4.12* 08/30/2009 0852   HGB 8.8* 06/29/2012 0425   HGB 12.6* 08/30/2009 0852   HCT 27.8* 06/29/2012 0425   HCT 37.3* 08/30/2009 0852   PLT 172 06/29/2012 0425   PLT 232 08/30/2009 0852   MCV 90.6 06/29/2012 0425   MCV 90.5 08/30/2009 0852   MCH 28.7 06/29/2012 0425   MCH 30.6 08/30/2009 0852   MCHC 31.7 06/29/2012 0425   MCHC 33.8 08/30/2009 0852   RDW 15.3 06/29/2012 0425   RDW 15.7* 08/30/2009 0852   LYMPHSABS 1.0 12/16/2011 1621   LYMPHSABS 1.0 08/30/2009 0852   MONOABS 1.4* 12/16/2011 1621     MONOABS 0.4 08/30/2009 0852   EOSABS 0.1 12/16/2011 1621   EOSABS 0.1 08/30/2009 0852   BASOSABS 0.0 12/16/2011 1621   BASOSABS 0.0 08/30/2009 0852    BMET    Component Value Date/Time   NA 137 06/29/2012 0425   K 4.2 06/29/2012 0425   CL 99 06/29/2012 0425   CO2 29 06/29/2012 0425   GLUCOSE 107* 06/29/2012 0425   BUN 24* 06/29/2012 0425   CREATININE 1.31 06/29/2012 0425   CALCIUM 8.6 06/29/2012 0425   GFRNONAA 52* 06/29/2012 0425   GFRAA 60* 06/29/2012 0425     Discharge Instructions:   The patient is discharged to home with extensive instructions on wound care and progressive ambulation.  They are instructed not to drive or perform any heavy lifting until returning to see the physician in his office.  Discharge Orders    Future Appointments: Provider: Department: Dept Phone: Center:   07/15/2012 10:40 AM [facility] [employer] [facility] [place]) [phone] [email or web address]   07/22/2012 8:45 AM Nada Libman, MD [facility] [phone] [facility]   10/02/2012 1:30 PM Barbaraann Share, MD [facility] [phone] None     Future Orders Please Complete By Expires   Resume previous diet      Driving Restrictions      Comments:   No driving for 2 weeks   Lifting restrictions  Comments:   No lifting for 6 weeks   Call MD for:  temperature >100.5      Call MD for:  redness, tenderness, or signs of infection (pain, swelling, bleeding, redness, odor or green/yellow discharge around incision site)      Call MD for:  severe or increased pain, loss or decreased feeling  in affected limb(s)      Discharge wound care:      Comments:   Shower daily with soap and water starting 07/01/12      Discharge Diagnosis:  CLAUDICATION  Secondary Diagnosis: Patient Active Problem List  Diagnosis  . COLON CANCER  . DYSLIPIDEMIA  . HYPOKALEMIA  . HYPERTENSION  . CORONARY ARTERY DISEASE  . DISEASE, ISCHEMIC HEART, CHRONIC NEC  .  VENTRICULAR TACHYCARDIA  . ATRIAL FIBRILLATION  . CHF  . PVD  . CHRONIC OBSTRUCTIVE PULMONARY DISEASE, ACUTE EXACERBATION  . COPD  . AUTOMATIC IMPLANTABLE CARDIAC DEFIBRILLATOR SITU  . Chronic systolic heart failure  . Syncope and collapse  . TIA (transient ischemic attack)  . Subdural hematoma  . Atherosclerosis of native arteries of the extremities with intermittent claudication   Past Medical History  Diagnosis Date  . Hypokalemia   . Colon cancer   . PVD (peripheral vascular disease)   . HTN (hypertension)   . Automatic implantable cardiac defibrillator in situ   . CHF (congestive heart failure)   . Ventricular tachycardia   . Ischemic heart disease, chronic   . Dyslipidemia   . CAD (coronary artery disease)   . COPD (chronic obstructive pulmonary disease)   . Emphysema   . On home oxygen therapy   . Stroke   . Pacemaker   . Self-catheterizes urinary bladder     since back surgery  . Difficult intubation     pt states "not aware of difficult intubation, never told"      Dadrian, [place]  Home Medication Instructions [id number]   Printed on:06/29/12 0737  Medication Information                    albuterol (PROVENTIL) (2.5 MG/3ML) 0.083% nebulizer solution Take 2.5 mg by nebulization 4 (four) times daily.            candesartan (ATACAND) 32 MG tablet Take 32 mg by mouth daily.             carvedilol (COREG) 12.5 MG tablet Take 12.5 mg by mouth 2 (two) times daily with a meal.             digoxin (LANOXIN) 0.125 MG tablet Take 125 mcg by mouth daily.             HYDROcodone-acetaminophen (VICODIN) 2.5-500 MG per tablet Take 1 tablet by mouth every 6 (six) hours as needed. Back pain           guaiFENesin (MUCINEX) 600 MG 12 hr tablet Take 600 mg by mouth 2 (two) times daily as needed. congestion           nitroGLYCERIN (NITROSTAT) 0.4 MG SL tablet Place 0.4 mg under the tongue every 5 (five) minutes as needed. May repeat x3 For chest pain             amLODipine (NORVASC) 5 MG tablet Take 5 mg by mouth daily.             budesonide (PULMICORT) 0.5 MG/2ML nebulizer solution Take 0.5 mg by nebulization 2 (two) times daily.  simvastatin (ZOCOR) 20 MG tablet Take 20 mg by mouth at bedtime.             tiotropium (SPIRIVA) 18 MCG inhalation capsule Place 18 mcg into inhaler and inhale daily.             nitroGLYCERIN (NITRODUR - DOSED IN MG/24 HR) 0.2 mg/hr Place 1 patch onto the skin daily.            ferrous sulfate 325 (65 FE) MG tablet Take 325 mg by mouth daily with breakfast.           potassium chloride SA (K-DUR,KLOR-CON) 20 MEQ tablet Take 1 tablet (20 mEq total) by mouth daily.           Misc. Devices (ACAPELLA) MISC Use flutter valve as directed           aspirin 325 MG tablet Take 325 mg by mouth daily.           furosemide (LASIX) 40 MG tablet Take 40-80 mg by mouth 2 (two) times daily. Take 2 tablets in the morning and 1 tablet in the evening           albuterol (PROVENTIL HFA;VENTOLIN HFA) 108 (90 BASE) MCG/ACT inhaler Inhale 1-2 puffs into the lungs every 6 (six) hours as needed. Shortness of breath           predniSONE (DELTASONE) 10 MG tablet Take 1 tablet (10 mg total) by mouth daily.           oxyCODONE (OXY IR/ROXICODONE) 5 MG immediate release tablet Take 1 tablet (5 mg total) by mouth once as needed. #30 NR            Disposition: home  Patient's condition: is Good  Follow up: 1. Dr. Myra Gianotti in 2 weeks   Doreatha Massed, PA-C [facility] [phone] 06/29/2012  7:37 AM

## 2012-06-29 NOTE — Progress Notes (Signed)
[facility] Progress Note  06/29/2012 7:20 AM POD 1  Subjective:  Doing well and wants to go home.  States he walked further this am without pain than he has in a long time.  Afebrile VSS 96% 2LO2NC  Filed Vitals:   06/29/12 0400  BP: 134/44  Pulse: 64  Temp: 97.9 F (36.6 C)  Resp: 13    Physical Exam: Incisions:  C/d/i with some ecchymosis; no hematoma Extremities:  + doppler signal left PT/DP CBC    Component Value Date/Time   WBC 8.2 06/29/2012 0425   WBC 13.1* 08/30/2009 0852   RBC 3.07* 06/29/2012 0425   RBC 4.12* 08/30/2009 0852   HGB 8.8* 06/29/2012 0425   HGB 12.6* 08/30/2009 0852   HCT 27.8* 06/29/2012 0425   HCT 37.3* 08/30/2009 0852   PLT 172 06/29/2012 0425   PLT 232 08/30/2009 0852   MCV 90.6 06/29/2012 0425   MCV 90.5 08/30/2009 0852   MCH 28.7 06/29/2012 0425   MCH 30.6 08/30/2009 0852   MCHC 31.7 06/29/2012 0425   MCHC 33.8 08/30/2009 0852   RDW 15.3 06/29/2012 0425   RDW 15.7* 08/30/2009 0852   LYMPHSABS 1.0 12/16/2011 1621   LYMPHSABS 1.0 08/30/2009 0852   MONOABS 1.4* 12/16/2011 1621   MONOABS 0.4 08/30/2009 0852   EOSABS 0.1 12/16/2011 1621   EOSABS 0.1 08/30/2009 0852   BASOSABS 0.0 12/16/2011 1621   BASOSABS 0.0 08/30/2009 0852    BMET    Component Value Date/Time   NA 137 06/29/2012 0425   K 4.2 06/29/2012 0425   CL 99 06/29/2012 0425   CO2 29 06/29/2012 0425   GLUCOSE 107* 06/29/2012 0425   BUN 24* 06/29/2012 0425   CREATININE 1.31 06/29/2012 0425   CALCIUM 8.6 06/29/2012 0425   GFRNONAA 52* 06/29/2012 0425   GFRAA 60* 06/29/2012 0425    INR    Component Value Date/Time   INR 1.09 06/18/2012 1030     Intake/Output Summary (Last 24 hours) at 06/29/12 0720 Last data filed at 06/29/12 0400  Gross per 24 hour  Intake 3088.33 ml  Output   1300 ml  Net 1788.33 ml     Assessment/Plan:  76 y.o. male is s/p Left common femoral, superficial femoral, and profunda femoral endarterectomy with Dacron patch angioplasty  POD 1  -acute surgical blood loss anemia - tolerating well -pt doing well this am and has walked around the entire unit.  Anxious to go home.  Doreatha Massed, PA-C [facility] [phone] 06/29/2012 7:20 AM

## 2012-06-29 NOTE — Progress Notes (Signed)
Discussed discharge instructions and medications with patient and Wife. Both verbalized understanding with all questions answered. VSS. Pulses dopplerable, site is bruised, level 1. Pt self catheterizes at home, MD is aware. Pt discharged home with wife on home o2.  [facility]

## 2012-06-29 NOTE — Evaluation (Signed)
Physical Therapy Evaluation Patient Details Name: BECKHAM CAPISTRAN MRN: [id number] DOB: December 15, 1935 Today's Date: 06/29/2012 Time: 5366-4403 PT Time Calculation (min): 18 min  PT Assessment / Plan / Recommendation Clinical Impression  Pt s/p L endarterectomy femoral. Pt moving well, supervision for all mobility. No further PT needs, will not follow    PT Assessment  Patent does not need any further PT services    Follow Up Recommendations  No PT follow up;Supervision for mobility/OOB    Does the patient have the potential to tolerate intense rehabilitation      Barriers to Discharge        Equipment Recommendations  None recommended by PT    Recommendations for Other Services     Frequency      Precautions / Restrictions Precautions Precautions: None Restrictions Weight Bearing Restrictions: No   Pertinent Vitals/Pain Minimal pain in L hip, 1/10. RN aware.       Mobility  Bed Mobility Bed Mobility: Not assessed Transfers Transfers: Sit to Stand;Stand to Sit Sit to Stand: 5: Supervision;With upper extremity assist;From chair/3-in-1 Stand to Sit: 5: Supervision;With upper extremity assist;To chair/3-in-1 Details for Transfer Assistance: Supervision for safety. Cues for hand placement to/from RW Ambulation/Gait Ambulation/Gait Assistance: 5: Supervision Ambulation Distance (Feet): 150 Feet Assistive device: Rolling walker Ambulation/Gait Assistance Details: Supervision for safety. Cues for upright posture as well as safe distance to RW Gait Pattern: Step-to pattern;Decreased stride length Gait velocity: slow gait speed Stairs: No    Shoulder Instructions     Exercises     PT Diagnosis:    PT Problem List:   PT Treatment Interventions:     PT Goals Acute Rehab PT Goals PT Goal Formulation: With patient  Visit Information  Last PT Received On: 06/29/12 Assistance Needed: +1    Subjective Data  Patient Stated Goal: to get stronger   Prior  Functioning  Home Living Lives With: Spouse Available Help at Discharge: Family;Available 24 hours/day Type of Home: House Home Access: Stairs to enter [employer] of Steps: 4 Entrance Stairs-Rails: Right;Left;Can reach both Home Layout: Two level;Able to live on main level with bedroom/bathroom Alternate Level Stairs-Number of Steps: 12 Alternate Level Stairs-Rails: Can reach both Bathroom Shower/Tub: Walk-in shower;Door [employer] Toilet: Standard Bathroom Accessibility: Yes How Accessible: Accessible via walker Home Adaptive Equipment: Straight cane;Walker - rolling;Shower chair with back;Grab bars around toilet;Grab bars in shower Prior Function Level of Independence: Independent Able to Take Stairs?: Yes Driving: Yes Vocation: Retired Musician: No difficulties Dominant Hand: Right    Cognition  Overall Cognitive Status: Appears within functional limits for tasks assessed/performed Arousal/Alertness: Awake/alert Orientation Level: Appears intact for tasks assessed Behavior During Session: [facility] for tasks performed    Extremity/Trunk Assessment Right Lower Extremity Assessment RLE ROM/Strength/Tone: Within functional levels RLE Sensation: WFL - Light Touch Left Lower Extremity Assessment LLE ROM/Strength/Tone: Unable to fully assess;Due to pain   Balance    End of Session PT - End of Session Equipment Utilized During Treatment: Gait belt Activity Tolerance: Patient tolerated treatment well Patient left: in chair;with call bell/phone within reach Nurse Communication: Mobility status  GP     Milana Kidney 06/29/2012, 9:34 AM  06/29/2012 Milana Kidney DPT PAGER: [phone] OFFICE: [phone]

## 2012-06-30 NOTE — Discharge Summary (Signed)
Looks great POD #1.  Stable for d/c   [facility] Sameen Leas

## 2012-07-01 ENCOUNTER — Encounter (HOSPITAL_COMMUNITY): Payer: Self-pay | Admitting: Surgery

## 2012-07-15 ENCOUNTER — Ambulatory Visit (INDEPENDENT_AMBULATORY_CARE_PROVIDER_SITE_OTHER): Payer: Medicare Other | Admitting: *Deleted

## 2012-07-15 ENCOUNTER — Encounter: Payer: Self-pay | Admitting: Internal Medicine

## 2012-07-15 DIAGNOSIS — I472 Ventricular tachycardia: Secondary | ICD-10-CM

## 2012-07-15 DIAGNOSIS — I5022 Chronic systolic (congestive) heart failure: Secondary | ICD-10-CM

## 2012-07-15 DIAGNOSIS — Z95810 Presence of automatic (implantable) cardiac defibrillator: Secondary | ICD-10-CM

## 2012-07-15 LAB — REMOTE ICD DEVICE
AL AMPLITUDE: 0.625 mv
AL IMPEDENCE ICD: 627 Ohm
ATRIAL PACING ICD: 81.45 pct
BAMS-0001: 170 {beats}/min
BRDY-0004LV: 110 {beats}/min
CHARGE TIME: 11.481 s
LV LEAD IMPEDENCE ICD: 589 Ohm
RV LEAD IMPEDENCE ICD: 532 Ohm
TOT-0001: 2
TOT-0006: 20091009000000
TZAT-0001ATACH: 2
TZAT-0001SLOWVT: 1
TZAT-0001SLOWVT: 2
TZAT-0002ATACH: NEGATIVE
TZAT-0002ATACH: NEGATIVE
TZAT-0004FASTVT: 8
TZAT-0004SLOWVT: 8
TZAT-0005SLOWVT: 88 pct
TZAT-0005SLOWVT: 91 pct
TZAT-0011FASTVT: 10 ms
TZAT-0018ATACH: NEGATIVE
TZAT-0018ATACH: NEGATIVE
TZAT-0018SLOWVT: NEGATIVE
TZAT-0018SLOWVT: NEGATIVE
TZAT-0019ATACH: 6 V
TZAT-0019ATACH: 6 V
TZAT-0019FASTVT: 8 V
TZAT-0020FASTVT: 1.5 ms
TZON-0004VSLOWVT: 40
TZON-0005SLOWVT: 12
TZST-0001ATACH: 5
TZST-0001FASTVT: 4
TZST-0001FASTVT: 6
TZST-0001SLOWVT: 3
TZST-0001SLOWVT: 6
TZST-0002ATACH: NEGATIVE
TZST-0003FASTVT: 35 J
TZST-0003FASTVT: 35 J
TZST-0003FASTVT: 35 J
TZST-0003SLOWVT: 35 J
VENTRICULAR PACING ICD: 98.64 pct
VF: 0

## 2012-07-17 ENCOUNTER — Encounter: Payer: Self-pay | Admitting: *Deleted

## 2012-07-19 ENCOUNTER — Encounter: Payer: Self-pay | Admitting: Surgery

## 2012-07-22 ENCOUNTER — Ambulatory Visit (INDEPENDENT_AMBULATORY_CARE_PROVIDER_SITE_OTHER): Payer: Medicare Other | Admitting: Surgery

## 2012-07-22 ENCOUNTER — Encounter: Payer: Self-pay | Admitting: Surgery

## 2012-07-22 VITALS — BP 127/47 | HR 77 | Ht 67.0 in | Wt 175.4 lb

## 2012-07-22 DIAGNOSIS — I70219 Atherosclerosis of native arteries of extremities with intermittent claudication, unspecified extremity: Secondary | ICD-10-CM

## 2012-07-22 DIAGNOSIS — Z48812 Encounter for surgical aftercare following surgery on the circulatory system: Secondary | ICD-10-CM

## 2012-07-22 DIAGNOSIS — I6529 Occlusion and stenosis of unspecified carotid artery: Secondary | ICD-10-CM

## 2012-07-22 NOTE — Progress Notes (Signed)
The patient is back today for followup. On 06/28/2012 he underwent left common femoral, superficial femoral, and profunda femoral endarterectomy with background patch angioplasty. This was done in the setting of severe left leg claudication. We have discussed multiple options including femoral-popliteal bypass grafting, however because of his comorbidities I elected to proceed with femoral endarterectomy as I felt this would alleviate his symptoms. His postoperative course was unremarkable, and he was discharged to home on postoperative day 1. He is back today without complaints. He states that his left leg symptoms have completely resolved. He has had very little discomfort in his groin.  On examination he has a palpable femoral pulse. The incision is completely healed.  Overall he has done exceptionally well from his operation. His symptoms have resolved. I will plan on having him come back in 6 months for a ultrasound. At that time, I will also check a carotid duplex as he did not have one preoperatively. He has been told in the past that he had carotid stenosis however he has not had a duplex ultrasound recently. He has been instructed to contact me should he develop recurrent symptoms.

## 2012-07-22 NOTE — Addendum Note (Signed)
Addended by: Sharee Pimple on: 07/22/2012 03:12 PM   Modules accepted: Orders

## 2012-08-17 ENCOUNTER — Emergency Department (HOSPITAL_COMMUNITY)
Admission: EM | Admit: 2012-08-17 | Discharge: 2012-08-17 | Disposition: A | Discharge status: Home / Self Care | Payer: Medicare Other | Attending: Emergency Medicine | Admitting: Emergency Medicine

## 2012-08-17 ENCOUNTER — Encounter (HOSPITAL_COMMUNITY): Payer: Self-pay | Admitting: Emergency Medicine

## 2012-08-17 DIAGNOSIS — Z85038 Personal history of other malignant neoplasm of large intestine: Secondary | ICD-10-CM | POA: Insufficient documentation

## 2012-08-17 DIAGNOSIS — I739 Peripheral vascular disease, unspecified: Secondary | ICD-10-CM | POA: Insufficient documentation

## 2012-08-17 DIAGNOSIS — IMO0002 Reserved for concepts with insufficient information to code with codable children: Secondary | ICD-10-CM | POA: Insufficient documentation

## 2012-08-17 DIAGNOSIS — I509 Heart failure, unspecified: Secondary | ICD-10-CM | POA: Insufficient documentation

## 2012-08-17 DIAGNOSIS — I2510 Atherosclerotic heart disease of native coronary artery without angina pectoris: Secondary | ICD-10-CM | POA: Insufficient documentation

## 2012-08-17 DIAGNOSIS — Z8679 Personal history of other diseases of the circulatory system: Secondary | ICD-10-CM | POA: Insufficient documentation

## 2012-08-17 DIAGNOSIS — Z95810 Presence of automatic (implantable) cardiac defibrillator: Secondary | ICD-10-CM | POA: Insufficient documentation

## 2012-08-17 DIAGNOSIS — Z87891 Personal history of nicotine dependence: Secondary | ICD-10-CM | POA: Insufficient documentation

## 2012-08-17 DIAGNOSIS — Z7982 Long term (current) use of aspirin: Secondary | ICD-10-CM | POA: Insufficient documentation

## 2012-08-17 DIAGNOSIS — Z862 Personal history of diseases of the blood and blood-forming organs and certain disorders involving the immune mechanism: Secondary | ICD-10-CM | POA: Insufficient documentation

## 2012-08-17 DIAGNOSIS — E789 Disorder of lipoprotein metabolism, unspecified: Secondary | ICD-10-CM | POA: Insufficient documentation

## 2012-08-17 DIAGNOSIS — R040 Epistaxis: Secondary | ICD-10-CM

## 2012-08-17 DIAGNOSIS — Z8639 Personal history of other endocrine, nutritional and metabolic disease: Secondary | ICD-10-CM | POA: Insufficient documentation

## 2012-08-17 DIAGNOSIS — Z79899 Other long term (current) drug therapy: Secondary | ICD-10-CM | POA: Insufficient documentation

## 2012-08-17 DIAGNOSIS — Z9981 Dependence on supplemental oxygen: Secondary | ICD-10-CM | POA: Insufficient documentation

## 2012-08-17 DIAGNOSIS — I10 Essential (primary) hypertension: Secondary | ICD-10-CM | POA: Insufficient documentation

## 2012-08-17 DIAGNOSIS — J4489 Other specified chronic obstructive pulmonary disease: Secondary | ICD-10-CM | POA: Insufficient documentation

## 2012-08-17 DIAGNOSIS — Z8673 Personal history of transient ischemic attack (TIA), and cerebral infarction without residual deficits: Secondary | ICD-10-CM | POA: Insufficient documentation

## 2012-08-17 DIAGNOSIS — J449 Chronic obstructive pulmonary disease, unspecified: Secondary | ICD-10-CM | POA: Insufficient documentation

## 2012-08-17 LAB — CBC WITH DIFFERENTIAL/PLATELET
Basophils Absolute: 0 10*3/uL (ref 0.0–0.1)
HCT: 30 % — ABNORMAL LOW (ref 39.0–52.0)
Hemoglobin: 9.7 g/dL — ABNORMAL LOW (ref 13.0–17.0)
Lymphocytes Relative: 11 % — ABNORMAL LOW (ref 12–46)
Monocytes Absolute: 1 10*3/uL (ref 0.1–1.0)
Neutro Abs: 7.1 10*3/uL (ref 1.7–7.7)
RDW: 14.4 % (ref 11.5–15.5)
WBC: 9.3 10*3/uL (ref 4.0–10.5)

## 2012-08-17 LAB — APTT: aPTT: 32 seconds (ref 24–37)

## 2012-08-17 LAB — PROTIME-INR: INR: 1.08 (ref 0.00–1.49)

## 2012-08-17 NOTE — ED Provider Notes (Signed)
Medical screening examination/treatment/procedure(s) were conducted as a shared visit with non-physician practitioner(s) and myself.  I personally evaluated the patient during the encounter   Loren Racer, MD 08/17/12 1553

## 2012-08-17 NOTE — ED Notes (Addendum)
PT presents with 200/ 70; nosebleed for hour with hx of packing needed in past. On 2.5 L [place] at home.

## 2012-08-17 NOTE — ED Notes (Signed)
Bleeding controlled at this time.

## 2012-08-17 NOTE — ED Notes (Signed)
Pt resting with family at bedside. NO signs of distress; A&Ox3; minimal bleeding from left nare at this time; 4x4 in left nare.

## 2012-08-17 NOTE — ED Notes (Signed)
PT ambulated with baseline gait; VSS; A&Ox3; no signs of distress; respirations even and unlabored; skin warm and dry; no questions upon discharge.  

## 2012-08-17 NOTE — ED Notes (Signed)
Pt to in and out cath per his at home routine.

## 2012-08-17 NOTE — ED Provider Notes (Signed)
History     CSN: [id number]  Arrival date & time 08/17/12  1049   First MD Initiated Contact with Patient 08/17/12 1059      Chief Complaint  Patient presents with  . Epistaxis    (Consider location/radiation/quality/duration/timing/severity/associated sxs/prior treatment) HPI Comments: This is a 77 year old male, who presents emergency department with chief complaint of epistaxis. Patient states is nose began bleeding approximately an hour ago. He states that he has controlled bleeding with pressure and gauze. Currently bleeding has ceased. He states that his blood pressure was running fairly high when his nose for started bleeding. He has a history of nosebleeds, for which he is followed by Dr. Pollyann Kennedy. He does not feel dizzy or lightheaded. He denies any pain, chest pain, shortness of breath, nausea, vomiting, diarrhea, constipation, numbness and tingling of the extremities.  The history is provided by the patient. No language interpreter was used.    Past Medical History  Diagnosis Date  . Hypokalemia   . Colon cancer   . PVD (peripheral vascular disease)   . HTN (hypertension)   . Automatic implantable cardiac defibrillator in situ   . CHF (congestive heart failure)   . Ventricular tachycardia   . Ischemic heart disease, chronic   . Dyslipidemia   . CAD (coronary artery disease)   . COPD (chronic obstructive pulmonary disease)   . Emphysema   . On home oxygen therapy   . Stroke   . Pacemaker   . Self-catheterizes urinary bladder     since back surgery  . Difficult intubation     pt states "not aware of difficult intubation, never told"    Past Surgical History  Procedure Laterality Date  . Lumbar back surgery      x2  . Cholecystectomy    . Iliac artery stent      left  . Implantation of medtronic concerto bi-v icd  04-10-08    [name] MD  . Colonoscopy    . Polypectomy    . Insertion of iliac stent  06/04/2012    right external   . Cardiac  catheterization    . Eye surgery      cataracts  . Insert / replace / remove pacemaker    . Endarterectomy femoral  06/28/2012    Procedure: ENDARTERECTOMY FEMORAL;  Surgeon: Nada Libman, MD;  Location: [facility] OR;  Service: Vascular;  Laterality: Left;  . Patch angioplasty  06/28/2012    Procedure: PATCH ANGIOPLASTY;  Surgeon: Nada Libman, MD;  Location: [facility] OR;  Service: Vascular;  Laterality: Left;  Using 0.8 cm x 15.2 cm Hemashield Patch.    Family History  Problem Relation Age of Onset  . Heart failure Father   . Heart disease Mother     before age 49  . Hyperlipidemia Mother   . Hypertension Mother     History  Substance Use Topics  . Smoking status: Former Smoker -- 3.00 packs/day for 48 years    Types: Cigarettes    Quit date: 07/03/2002  . Smokeless tobacco: Former Neurosurgeon    Quit date: 07/11/2002  . Alcohol Use: Yes     Comment: occasional      Review of Systems  All other systems reviewed and are negative.    Allergies  Review of patient's allergies indicates no known allergies.  Home Medications   Current Outpatient Rx  Name  Route  Sig  Dispense  Refill  . albuterol (PROVENTIL HFA;VENTOLIN HFA) 108 (90 BASE) MCG/ACT  inhaler   Inhalation   Inhale 1-2 puffs into the lungs every 6 (six) hours as needed. Shortness of breath   3 Inhaler   3   . albuterol (PROVENTIL) (2.5 MG/3ML) 0.083% nebulizer solution   Nebulization   Take 2.5 mg by nebulization 4 (four) times daily.          Marland Kitchen amLODipine (NORVASC) 5 MG tablet   Oral   Take 5 mg by mouth daily.           Marland Kitchen aspirin 325 MG tablet   Oral   Take 325 mg by mouth daily.         . budesonide (PULMICORT) 0.5 MG/2ML nebulizer solution   Nebulization   Take 0.5 mg by nebulization 2 (two) times daily.          . candesartan (ATACAND) 32 MG tablet   Oral   Take 32 mg by mouth daily.           . carvedilol (COREG) 12.5 MG tablet   Oral   Take 12.5 mg by mouth 2 (two) times daily with a  meal.           . digoxin (LANOXIN) 0.125 MG tablet   Oral   Take 125 mcg by mouth daily.           . ferrous sulfate 325 (65 FE) MG tablet   Oral   Take 325 mg by mouth at bedtime.          . furosemide (LASIX) 40 MG tablet   Oral   Take 40-80 mg by mouth 2 (two) times daily. Take 2 tablets in the morning and 1 tablet in the evening         . guaiFENesin (MUCINEX) 600 MG 12 hr tablet   Oral   Take 600 mg by mouth 2 (two) times daily as needed. congestion         . HYDROcodone-acetaminophen (VICODIN) 2.5-500 MG per tablet   Oral   Take 1 tablet by mouth every 6 (six) hours as needed. Back pain         . nitroGLYCERIN (NITRODUR - DOSED IN MG/24 HR) 0.2 mg/hr   Transdermal   Place 1 patch onto the skin daily.          . potassium chloride SA (K-DUR,KLOR-CON) 20 MEQ tablet   Oral   Take 1 tablet (20 mEq total) by mouth daily.   90 tablet   3   . predniSONE (DELTASONE) 10 MG tablet   Oral   Take 1 tablet (10 mg total) by mouth daily.   30 tablet   6   . simvastatin (ZOCOR) 20 MG tablet   Oral   Take 20 mg by mouth at bedtime.           Marland Kitchen tiotropium (SPIRIVA) 18 MCG inhalation capsule   Inhalation   Place 18 mcg into inhaler and inhale daily.           . Misc. Devices (ACAPELLA) MISC      Use flutter valve as directed   1 each   0   . nitroGLYCERIN (NITROSTAT) 0.4 MG SL tablet   Sublingual   Place 0.4 mg under the tongue every 5 (five) minutes as needed. May repeat x3 For chest pain         . oxyCODONE (OXY IR/ROXICODONE) 5 MG immediate release tablet   Oral   Take 1 tablet (5 mg total) by mouth once as  needed.   30 tablet   0     BP 154/46  Temp(Src) 98.4 F (36.9 C) (Oral)  Resp 19  SpO2 96%  Physical Exam  Nursing note and vitals reviewed. Constitutional: He is oriented to person, place, and time. He appears well-developed and well-nourished.  HENT:  Head: Normocephalic and atraumatic.  Controlled bleeding from right nares,  visible clot forming, no signs of trauma, deformity, or gross abnormality  Eyes: Conjunctivae and EOM are normal.  Neck: Normal range of motion.  Cardiovascular: Normal rate, regular rhythm and normal heart sounds.  Exam reveals no gallop and no friction rub.   No murmur heard. Pulmonary/Chest: Effort normal. No respiratory distress. He has no wheezes. He has no rales. He exhibits no tenderness.  On 2.5 L nasal cannula  Abdominal: He exhibits no distension.  Musculoskeletal: Normal range of motion.  Neurological: He is alert and oriented to person, place, and time.  Skin: Skin is dry.  Psychiatric: He has a normal mood and affect. His behavior is normal. Judgment and thought content normal.    ED Course  Procedures (including critical care time)  Labs Reviewed  CBC WITH DIFFERENTIAL  PROTIME-INR  APTT   Results for orders placed during the hospital encounter of 08/17/12  CBC WITH DIFFERENTIAL      Result Value Range   WBC 9.3  4.0 - 10.5 K/uL   RBC 3.37 (*) 4.22 - 5.81 MIL/uL   Hemoglobin 9.7 (*) 13.0 - 17.0 g/dL   HCT 16.1 (*) 09.6 - 04.5 %   MCV 89.0  78.0 - 100.0 fL   MCH 28.8  26.0 - 34.0 pg   MCHC 32.3  30.0 - 36.0 g/dL   RDW 40.9  81.1 - 91.4 %   Platelets 193  150 - 400 K/uL   Neutrophils Relative 76  43 - 77 %   Neutro Abs 7.1  1.7 - 7.7 K/uL   Lymphocytes Relative 11 (*) 12 - 46 %   Lymphs Abs 1.0  0.7 - 4.0 K/uL   Monocytes Relative 11  3 - 12 %   Monocytes Absolute 1.0  0.1 - 1.0 K/uL   Eosinophils Relative 2  0 - 5 %   Eosinophils Absolute 0.2  0.0 - 0.7 K/uL   Basophils Relative 0  0 - 1 %   Basophils Absolute 0.0  0.0 - 0.1 K/uL  PROTIME-INR      Result Value Range   Prothrombin Time 13.9  11.6 - 15.2 seconds   INR 1.08  0.00 - 1.49  APTT      Result Value Range   aPTT 32  24 - 37 seconds       1. Epistaxis       MDM  77 year old male with epistaxis. Bleeding is currently controlled. I discussed patient with Dr. Ranae Palms, who also  personally seen the patient. Will order basic labs, to assess blood counts are and coag status. If bleeding remains controlled, will likely discharge the patient to home with followup with ENT.  12:40 PM Dr. Ranae Palms saw the patient, and reviewed the labs with me.  The patient may be discharged to home with ENT follow-up which the patient already has scheduled.  Return precautions have been given.  The patient understands and agrees with the plan.  Bleeding is controlled.  Patient is stable and ready for discharge.        Roxy Horseman, PA-C 08/17/12 1253

## 2012-09-16 ENCOUNTER — Other Ambulatory Visit: Payer: Self-pay | Admitting: *Deleted

## 2012-09-16 MED ORDER — POTASSIUM CHLORIDE CRYS ER 20 MEQ PO TBCR: 20.0000 meq | EXTENDED_RELEASE_TABLET | Freq: Every day | ORAL | Status: DC

## 2012-09-18 ENCOUNTER — Encounter: Payer: Self-pay | Admitting: Internal Medicine

## 2012-09-24 ENCOUNTER — Other Ambulatory Visit: Payer: Self-pay | Admitting: Pulmonary Disease

## 2012-10-01 ENCOUNTER — Encounter: Payer: Self-pay | Admitting: *Deleted

## 2012-10-01 ENCOUNTER — Other Ambulatory Visit: Payer: Self-pay | Admitting: *Deleted

## 2012-10-01 ENCOUNTER — Ambulatory Visit (INDEPENDENT_AMBULATORY_CARE_PROVIDER_SITE_OTHER): Payer: Medicare Other | Admitting: Internal Medicine

## 2012-10-01 ENCOUNTER — Encounter: Payer: Self-pay | Admitting: Internal Medicine

## 2012-10-01 VITALS — BP 153/49 | HR 87 | Ht 67.0 in | Wt 181.8 lb

## 2012-10-01 DIAGNOSIS — I472 Ventricular tachycardia, unspecified: Secondary | ICD-10-CM

## 2012-10-01 DIAGNOSIS — I5022 Chronic systolic (congestive) heart failure: Secondary | ICD-10-CM

## 2012-10-01 DIAGNOSIS — Z45018 Encounter for adjustment and management of other part of cardiac pacemaker: Secondary | ICD-10-CM

## 2012-10-01 DIAGNOSIS — I255 Ischemic cardiomyopathy: Secondary | ICD-10-CM

## 2012-10-01 DIAGNOSIS — Z95810 Presence of automatic (implantable) cardiac defibrillator: Secondary | ICD-10-CM

## 2012-10-01 NOTE — Assessment & Plan Note (Signed)
His chronic systolic heart failure is class III. He is somewhat multifactorial, as he has COPD as well and is on home oxygen. He will continue a low-sodium diet, and his current medical therapy.

## 2012-10-01 NOTE — Assessment & Plan Note (Signed)
His biventricular ICD is recently replacement. We will schedule ICD generator removal and insertion of a new device. I discussed the risk, goals, benefits, of the procedure with the patient and he wishes to proceed.

## 2012-10-01 NOTE — Patient Instructions (Addendum)
ICD generator change 10/22/12  See instruction sheet

## 2012-10-01 NOTE — Assessment & Plan Note (Signed)
He has had no recurrent ventricular arrhythmias. No change in medical therapy today.

## 2012-10-01 NOTE — Progress Notes (Signed)
HPI [name] returns today for followup. He is a 76-year-old man with an ischemic cardiomyopathy, chronic systolic heart failure, status post biventricular ICD implantation. He has ventricular tachycardia, but has had no recent ICD shocks. He has reached elective replacement on his ICD. No Known Allergies   Current Outpatient Prescriptions  Medication Sig Dispense Refill  . albuterol (PROVENTIL HFA;VENTOLIN HFA) 108 (90 BASE) MCG/ACT inhaler Inhale 1-2 puffs into the lungs every 6 (six) hours as needed. Shortness of breath  3 Inhaler  3  . albuterol (PROVENTIL) (2.5 MG/3ML) 0.083% nebulizer solution Take 2.5 mg by nebulization 4 (four) times daily.       . amLODipine (NORVASC) 5 MG tablet Take 5 mg by mouth daily.        . aspirin 325 MG tablet Take 325 mg by mouth daily.      . budesonide (PULMICORT) 0.5 MG/2ML nebulizer solution Take 0.5 mg by nebulization 2 (two) times daily.       . candesartan (ATACAND) 32 MG tablet Take 32 mg by mouth daily.        . carvedilol (COREG) 12.5 MG tablet Take 12.5 mg by mouth 2 (two) times daily with a meal.        . digoxin (LANOXIN) 0.125 MG tablet Take 125 mcg by mouth daily.        . ferrous sulfate 325 (65 FE) MG tablet Take 325 mg by mouth at bedtime.       . furosemide (LASIX) 40 MG tablet TAKE 2 TABLETS BY MOUTH IN THE MORNING AND 1 TABLET IN THE EVENING  270 tablet  3  . guaiFENesin (MUCINEX) 600 MG 12 hr tablet Take 600 mg by mouth 2 (two) times daily as needed. congestion      . HYDROcodone-acetaminophen (VICODIN) 2.5-500 MG per tablet Take 1 tablet by mouth every 6 (six) hours as needed. Back pain      . Misc. Devices (ACAPELLA) MISC Use flutter valve as directed  1 each  0  . nitroGLYCERIN (NITRODUR - DOSED IN MG/24 HR) 0.2 mg/hr Place 1 patch onto the skin daily.       . nitroGLYCERIN (NITROSTAT) 0.4 MG SL tablet Place 0.4 mg under the tongue every 5 (five) minutes as needed. May repeat x3 For chest pain      . oxyCODONE (OXY IR/ROXICODONE)  5 MG immediate release tablet Take 1 tablet (5 mg total) by mouth once as needed.  30 tablet  0  . potassium chloride SA (K-DUR,KLOR-CON) 20 MEQ tablet Take 1 tablet (20 mEq total) by mouth daily.  90 tablet  3  . potassium chloride SA (K-DUR,KLOR-CON) 20 MEQ tablet Take 1 tablet (20 mEq total) by mouth daily.  90 tablet  3  . predniSONE (DELTASONE) 10 MG tablet Take 1 tablet (10 mg total) by mouth daily.  30 tablet  6  . simvastatin (ZOCOR) 20 MG tablet Take 20 mg by mouth at bedtime.        . tiotropium (SPIRIVA) 18 MCG inhalation capsule Place 18 mcg into inhaler and inhale daily.         No current facility-administered medications for this visit.     Past Medical History  Diagnosis Date  . Hypokalemia   . Colon cancer   . PVD (peripheral vascular disease)   . HTN (hypertension)   . Automatic implantable cardiac defibrillator in situ   . CHF (congestive heart failure)   . Ventricular tachycardia   . Ischemic heart disease, chronic   .   Dyslipidemia   . CAD (coronary artery disease)   . COPD (chronic obstructive pulmonary disease)   . Emphysema   . On home oxygen therapy   . Stroke   . Pacemaker   . Self-catheterizes urinary bladder     since back surgery  . Difficult intubation     pt states "not aware of difficult intubation, never told"    ROS:   All systems reviewed and negative except as noted in the HPI.   Past Surgical History  Procedure Laterality Date  . Lumbar back surgery      x2  . Cholecystectomy    . Iliac artery stent      left  . Implantation of medtronic concerto bi-v icd  04-10-08    Gregg Taylor MD  . Colonoscopy    . Polypectomy    . Insertion of iliac stent  06/04/2012    right external   . Cardiac catheterization    . Eye surgery      cataracts  . Insert / replace / remove pacemaker    . Endarterectomy femoral  06/28/2012    Procedure: ENDARTERECTOMY FEMORAL;  Surgeon: Vance W Brabham, MD;  Location: [facility] OR;  Service: Vascular;   Laterality: Left;  . Patch angioplasty  06/28/2012    Procedure: PATCH ANGIOPLASTY;  Surgeon: Vance W Brabham, MD;  Location: [facility] OR;  Service: Vascular;  Laterality: Left;  Using 0.8 cm x 15.2 cm Hemashield Patch.     Family History  Problem Relation Age of Onset  . Heart failure Father   . Heart disease Mother     before age 60  . Hyperlipidemia Mother   . Hypertension Mother      History   Social History  . Marital Status: Married    Spouse Name: N/A    Number of Children: N/A  . Years of Education: N/A   Occupational History  . Not on file.   Social History Main Topics  . Smoking status: Former Smoker -- 3.00 packs/day for 48 years    Types: Cigarettes    Quit date: 07/03/2002  . Smokeless tobacco: Former User    Quit date: 07/11/2002  . Alcohol Use: Yes     Comment: occasional  . Drug Use: No  . Sexually Active: Not on file   Other Topics Concern  . Not on file   Social History Narrative  . No narrative on file     BP 153/49  Pulse 87  Ht 5' 7" (1.702 m)  Wt 181 lb 12.8 oz (82.464 kg)  BMI 28.47 kg/m2  SpO2 96%  Physical Exam: Chronically ill appearing NAD HEENT: Unremarkable Neck:  7 cm JVD, no thyromegally Lungs:  Clear with no wheezes, rales, or rhonchi. HEART:  Regular rate rhythm, no murmurs, no rubs, no clicks Abd:  soft, positive bowel sounds, no organomegally, no rebound, no guarding Ext:  2 plus pulses, no edema, no cyanosis, no clubbing Skin:  No rashes no nodules Neuro:  CN II through XII intact, motor grossly intact  EKG - normal sinus rhythm with AV sequential pacing  DEVICE  Normal device function.  See PaceArt for details.   Assess/Plan: 

## 2012-10-02 ENCOUNTER — Encounter: Payer: Self-pay | Admitting: Pulmonary Disease

## 2012-10-02 ENCOUNTER — Ambulatory Visit (INDEPENDENT_AMBULATORY_CARE_PROVIDER_SITE_OTHER): Payer: Medicare Other | Admitting: Pulmonary Disease

## 2012-10-02 VITALS — BP 132/60 | HR 75 | Temp 98.5°F | Ht 67.0 in | Wt 183.0 lb

## 2012-10-02 DIAGNOSIS — J449 Chronic obstructive pulmonary disease, unspecified: Secondary | ICD-10-CM

## 2012-10-02 MED ORDER — ALBUTEROL SULFATE HFA 108 (90 BASE) MCG/ACT IN AERS: INHALATION_SPRAY | RESPIRATORY_TRACT | Status: DC

## 2012-10-02 NOTE — Patient Instructions (Addendum)
No change in medications Stay as active as possible. followup with me in 4mos.

## 2012-10-02 NOTE — Assessment & Plan Note (Signed)
The patient has severe COPD, but has been stable from a pulmonary standpoint.  I have asked him to continue on his current meds, and to try and stay as active as possible.  He will followup with me again in 6 months.

## 2012-10-02 NOTE — Progress Notes (Signed)
  Subjective:    Patient ID: [name], male    DOB: December 24, 1935, 77 y.o.   MRN: [id number]  HPI Patient comes in today for followup of his known COPD.  He has been stable since the last visit, with no acute exacerbation her pulmonary infection.  He is maintaining on his bronchodilator regimen, and feels that his breathing is at baseline.  He denies any significant chest congestion or purulent mucus.   Review of Systems  Constitutional: Negative for fever and unexpected weight change.  HENT: Positive for rhinorrhea. Negative for ear pain, nosebleeds, congestion, sore throat, sneezing, trouble swallowing, dental problem, postnasal drip and sinus pressure.   Eyes: Negative for redness and itching.  Respiratory: Negative for cough, chest tightness, shortness of breath and wheezing.   Cardiovascular: Positive for leg swelling. Negative for palpitations.  Gastrointestinal: Negative for nausea and vomiting.  Genitourinary: Negative for dysuria.  Musculoskeletal: Negative for joint swelling.  Skin: Negative for rash.  Neurological: Negative for headaches.  Hematological: Does not bruise/bleed easily.  Psychiatric/Behavioral: Negative for dysphoric mood. The patient is not nervous/anxious.        Objective:   Physical Exam Well-developed male in no acute distress Nose without purulence or discharge noted Neck without lymphadenopathy or thyromegaly Chest with mildly decreased breath sounds, no wheezes Cardiac exam with regular rate and rhythm Lower extremities with minimal edema, no cyanosis Alert and oriented, moves all 4 extremities.       Assessment & Plan:

## 2012-10-02 NOTE — Addendum Note (Signed)
Addended by: Nita Sells on: 10/02/2012 02:17 PM   Modules accepted: Orders

## 2012-10-14 ENCOUNTER — Other Ambulatory Visit (INDEPENDENT_AMBULATORY_CARE_PROVIDER_SITE_OTHER): Payer: Medicare Other

## 2012-10-14 DIAGNOSIS — I472 Ventricular tachycardia: Secondary | ICD-10-CM

## 2012-10-14 DIAGNOSIS — I5022 Chronic systolic (congestive) heart failure: Secondary | ICD-10-CM

## 2012-10-14 DIAGNOSIS — Z95810 Presence of automatic (implantable) cardiac defibrillator: Secondary | ICD-10-CM

## 2012-10-14 LAB — CBC WITH DIFFERENTIAL/PLATELET
Basophils Absolute: 0 10*3/uL (ref 0.0–0.1)
Eosinophils Relative: 1.5 % (ref 0.0–5.0)
Hemoglobin: 9.4 g/dL — ABNORMAL LOW (ref 13.0–17.0)
Lymphocytes Relative: 10.5 % — ABNORMAL LOW (ref 12.0–46.0)
Monocytes Relative: 7.3 % (ref 3.0–12.0)
Neutro Abs: 6.7 10*3/uL (ref 1.4–7.7)
RBC: 3.35 Mil/uL — ABNORMAL LOW (ref 4.22–5.81)
RDW: 16 % — ABNORMAL HIGH (ref 11.5–14.6)
WBC: 8.3 10*3/uL (ref 4.5–10.5)

## 2012-10-14 LAB — BASIC METABOLIC PANEL
Calcium: 8.8 mg/dL (ref 8.4–10.5)
GFR: 84.21 mL/min (ref >60.00)
Glucose, Bld: 202 mg/dL — ABNORMAL HIGH (ref 70–99)
Sodium: 137 mEq/L (ref 135–145)

## 2012-10-15 ENCOUNTER — Other Ambulatory Visit: Payer: Medicare Other

## 2012-10-21 MED ORDER — SODIUM CHLORIDE 0.9 % IR SOLN
80.0000 mg | Status: DC
  Filled 2012-10-21: qty 2

## 2012-10-21 MED ORDER — CEFAZOLIN SODIUM-DEXTROSE 2-3 GM-% IV SOLR
2.0000 g | INTRAVENOUS | Status: DC
Start: 2012-10-22 — End: 2012-10-22
  Filled 2012-10-21 (×2): qty 50

## 2012-10-22 ENCOUNTER — Encounter (HOSPITAL_COMMUNITY)
Admission: RE | Disposition: A | Discharge status: Home / Self Care | Payer: Self-pay | Source: Ambulatory Visit | Attending: Internal Medicine

## 2012-10-22 ENCOUNTER — Ambulatory Visit (HOSPITAL_COMMUNITY)
Admission: RE | Admit: 2012-10-22 | Discharge: 2012-10-22 | Disposition: A | Discharge status: Home / Self Care | Payer: Medicare Other | Source: Ambulatory Visit | Attending: Internal Medicine | Admitting: Internal Medicine

## 2012-10-22 DIAGNOSIS — I255 Ischemic cardiomyopathy: Secondary | ICD-10-CM

## 2012-10-22 DIAGNOSIS — E785 Hyperlipidemia, unspecified: Secondary | ICD-10-CM | POA: Insufficient documentation

## 2012-10-22 DIAGNOSIS — I472 Ventricular tachycardia, unspecified: Secondary | ICD-10-CM | POA: Insufficient documentation

## 2012-10-22 DIAGNOSIS — Z79899 Other long term (current) drug therapy: Secondary | ICD-10-CM | POA: Insufficient documentation

## 2012-10-22 DIAGNOSIS — I2589 Other forms of chronic ischemic heart disease: Secondary | ICD-10-CM | POA: Insufficient documentation

## 2012-10-22 DIAGNOSIS — I5022 Chronic systolic (congestive) heart failure: Secondary | ICD-10-CM | POA: Insufficient documentation

## 2012-10-22 DIAGNOSIS — Z8673 Personal history of transient ischemic attack (TIA), and cerebral infarction without residual deficits: Secondary | ICD-10-CM | POA: Insufficient documentation

## 2012-10-22 DIAGNOSIS — Z87891 Personal history of nicotine dependence: Secondary | ICD-10-CM | POA: Insufficient documentation

## 2012-10-22 DIAGNOSIS — I10 Essential (primary) hypertension: Secondary | ICD-10-CM | POA: Insufficient documentation

## 2012-10-22 DIAGNOSIS — I739 Peripheral vascular disease, unspecified: Secondary | ICD-10-CM | POA: Insufficient documentation

## 2012-10-22 DIAGNOSIS — I2510 Atherosclerotic heart disease of native coronary artery without angina pectoris: Secondary | ICD-10-CM | POA: Insufficient documentation

## 2012-10-22 DIAGNOSIS — Z4502 Encounter for adjustment and management of automatic implantable cardiac defibrillator: Secondary | ICD-10-CM | POA: Insufficient documentation

## 2012-10-22 DIAGNOSIS — Z7982 Long term (current) use of aspirin: Secondary | ICD-10-CM | POA: Insufficient documentation

## 2012-10-22 DIAGNOSIS — E876 Hypokalemia: Secondary | ICD-10-CM | POA: Insufficient documentation

## 2012-10-22 DIAGNOSIS — I509 Heart failure, unspecified: Secondary | ICD-10-CM | POA: Insufficient documentation

## 2012-10-22 DIAGNOSIS — Z45018 Encounter for adjustment and management of other part of cardiac pacemaker: Secondary | ICD-10-CM

## 2012-10-22 DIAGNOSIS — I4729 Other ventricular tachycardia: Secondary | ICD-10-CM | POA: Insufficient documentation

## 2012-10-22 DIAGNOSIS — J438 Other emphysema: Secondary | ICD-10-CM | POA: Insufficient documentation

## 2012-10-22 DIAGNOSIS — IMO0002 Reserved for concepts with insufficient information to code with codable children: Secondary | ICD-10-CM | POA: Insufficient documentation

## 2012-10-22 HISTORY — PX: BIV ICD GENERTAOR CHANGE OUT: SHX5745

## 2012-10-22 LAB — SURGICAL PCR SCREEN: Staphylococcus aureus: NEGATIVE

## 2012-10-22 SURGERY — BIV ICD GENERTAOR CHANGE OUT
Anesthesia: LOCAL

## 2012-10-22 MED ORDER — SODIUM CHLORIDE 0.9 % IV SOLN
INTRAVENOUS | Status: DC
  Administered 2012-10-22: 50 mL/h via INTRAVENOUS

## 2012-10-22 MED ORDER — CHLORHEXIDINE GLUCONATE 4 % EX LIQD: 60.0000 mL | Freq: Once | CUTANEOUS | Status: DC

## 2012-10-22 MED ORDER — LIDOCAINE HCL (PF) 1 % IJ SOLN
INTRAMUSCULAR | Status: AC
  Filled 2012-10-22: qty 60

## 2012-10-22 MED ORDER — MIDAZOLAM HCL 5 MG/5ML IJ SOLN
INTRAMUSCULAR | Status: AC
  Filled 2012-10-22: qty 5

## 2012-10-22 MED ORDER — MUPIROCIN 2 % EX OINT
TOPICAL_OINTMENT | CUTANEOUS | Status: AC
  Filled 2012-10-22: qty 22

## 2012-10-22 MED ORDER — MUPIROCIN 2 % EX OINT
TOPICAL_OINTMENT | Freq: Once | CUTANEOUS | Status: AC
  Administered 2012-10-22: 1 via NASAL

## 2012-10-22 MED ORDER — FENTANYL CITRATE 0.05 MG/ML IJ SOLN
INTRAMUSCULAR | Status: AC
  Filled 2012-10-22: qty 2

## 2012-10-22 NOTE — Interval H&P Note (Signed)
History and Physical Interval Note:  10/22/2012 1:17 PM  [name]  has presented today for surgery, with the diagnosis of End of life  The various methods of treatment have been discussed with the patient and family. After consideration of risks, benefits and other options for treatment, the patient has consented to  Procedure(s): BIV ICD GENERTAOR CHANGE OUT (N/A) as a surgical intervention .  The patient's history has been reviewed, patient examined, no change in status, stable for surgery.  I have reviewed the patient's chart and labs.  Questions were answered to the patient's satisfaction.     [name]

## 2012-10-22 NOTE — H&P (View-Only) (Signed)
HPI [name] returns today for followup. He is a 77 year old man with an ischemic cardiomyopathy, chronic systolic heart failure, status post biventricular ICD implantation. He has ventricular tachycardia, but has had no recent ICD shocks. He has reached elective replacement on his ICD. No Known Allergies   Current Outpatient Prescriptions  Medication Sig Dispense Refill  . albuterol (PROVENTIL HFA;VENTOLIN HFA) 108 (90 BASE) MCG/ACT inhaler Inhale 1-2 puffs into the lungs every 6 (six) hours as needed. Shortness of breath  3 Inhaler  3  . albuterol (PROVENTIL) (2.5 MG/3ML) 0.083% nebulizer solution Take 2.5 mg by nebulization 4 (four) times daily.       Marland Kitchen amLODipine (NORVASC) 5 MG tablet Take 5 mg by mouth daily.        Marland Kitchen aspirin 325 MG tablet Take 325 mg by mouth daily.      . budesonide (PULMICORT) 0.5 MG/2ML nebulizer solution Take 0.5 mg by nebulization 2 (two) times daily.       . candesartan (ATACAND) 32 MG tablet Take 32 mg by mouth daily.        . carvedilol (COREG) 12.5 MG tablet Take 12.5 mg by mouth 2 (two) times daily with a meal.        . digoxin (LANOXIN) 0.125 MG tablet Take 125 mcg by mouth daily.        . ferrous sulfate 325 (65 FE) MG tablet Take 325 mg by mouth at bedtime.       . furosemide (LASIX) 40 MG tablet TAKE 2 TABLETS BY MOUTH IN THE MORNING AND 1 TABLET IN THE EVENING  270 tablet  3  . guaiFENesin (MUCINEX) 600 MG 12 hr tablet Take 600 mg by mouth 2 (two) times daily as needed. congestion      . HYDROcodone-acetaminophen (VICODIN) 2.5-500 MG per tablet Take 1 tablet by mouth every 6 (six) hours as needed. Back pain      . Misc. Devices (ACAPELLA) MISC Use flutter valve as directed  1 each  0  . nitroGLYCERIN (NITRODUR - DOSED IN MG/24 HR) 0.2 mg/hr Place 1 patch onto the skin daily.       . nitroGLYCERIN (NITROSTAT) 0.4 MG SL tablet Place 0.4 mg under the tongue every 5 (five) minutes as needed. May repeat x3 For chest pain      . oxyCODONE (OXY IR/ROXICODONE)  5 MG immediate release tablet Take 1 tablet (5 mg total) by mouth once as needed.  30 tablet  0  . potassium chloride SA (K-DUR,KLOR-CON) 20 MEQ tablet Take 1 tablet (20 mEq total) by mouth daily.  90 tablet  3  . potassium chloride SA (K-DUR,KLOR-CON) 20 MEQ tablet Take 1 tablet (20 mEq total) by mouth daily.  90 tablet  3  . predniSONE (DELTASONE) 10 MG tablet Take 1 tablet (10 mg total) by mouth daily.  30 tablet  6  . simvastatin (ZOCOR) 20 MG tablet Take 20 mg by mouth at bedtime.        Marland Kitchen tiotropium (SPIRIVA) 18 MCG inhalation capsule Place 18 mcg into inhaler and inhale daily.         No current facility-administered medications for this visit.     Past Medical History  Diagnosis Date  . Hypokalemia   . Colon cancer   . PVD (peripheral vascular disease)   . HTN (hypertension)   . Automatic implantable cardiac defibrillator in situ   . CHF (congestive heart failure)   . Ventricular tachycardia   . Ischemic heart disease, chronic   .  Dyslipidemia   . CAD (coronary artery disease)   . COPD (chronic obstructive pulmonary disease)   . Emphysema   . On home oxygen therapy   . Stroke   . Pacemaker   . Self-catheterizes urinary bladder     since back surgery  . Difficult intubation     pt states "not aware of difficult intubation, never told"    ROS:   All systems reviewed and negative except as noted in the HPI.   Past Surgical History  Procedure Laterality Date  . Lumbar back surgery      x2  . Cholecystectomy    . Iliac artery stent      left  . Implantation of medtronic concerto bi-v icd  04-10-08    Robert Bunting MD  . Colonoscopy    . Polypectomy    . Insertion of iliac stent  06/04/2012    right external   . Cardiac catheterization    . Eye surgery      cataracts  . Insert / replace / remove pacemaker    . Endarterectomy femoral  06/28/2012    Procedure: ENDARTERECTOMY FEMORAL;  Surgeon: Robert Libman, MD;  Location: [facility] OR;  Service: Vascular;   Laterality: Left;  . Patch angioplasty  06/28/2012    Procedure: PATCH ANGIOPLASTY;  Surgeon: Robert Libman, MD;  Location: [facility] OR;  Service: Vascular;  Laterality: Left;  Using 0.8 cm x 15.2 cm Hemashield Patch.     Family History  Problem Relation Age of Onset  . Heart failure Father   . Heart disease Mother     before age 76  . Hyperlipidemia Mother   . Hypertension Mother      History   Social History  . Marital Status: Married    Spouse Name: N/A    Number of Children: N/A  . Years of Education: N/A   Occupational History  . Not on file.   Social History Main Topics  . Smoking status: Former Smoker -- 3.00 packs/day for 48 years    Types: Cigarettes    Quit date: 07/03/2002  . Smokeless tobacco: Former Neurosurgeon    Quit date: 07/11/2002  . Alcohol Use: Yes     Comment: occasional  . Drug Use: No  . Sexually Active: Not on file   Other Topics Concern  . Not on file   Social History Narrative  . No narrative on file     BP 153/49  Pulse 87  Ht 5\' 7"  (1.702 m)  Wt 181 lb 12.8 oz (82.464 kg)  BMI 28.47 kg/m2  SpO2 96%  Physical Exam: Chronically ill appearing NAD HEENT: Unremarkable Neck:  7 cm JVD, no thyromegally Lungs:  Clear with no wheezes, rales, or rhonchi. HEART:  Regular rate rhythm, no murmurs, no rubs, no clicks Abd:  soft, positive bowel sounds, no organomegally, no rebound, no guarding Ext:  2 plus pulses, no edema, no cyanosis, no clubbing Skin:  No rashes no nodules Neuro:  CN II through XII intact, motor grossly intact  EKG - normal sinus rhythm with AV sequential pacing  DEVICE  Normal device function.  See PaceArt for details.   Assess/Plan:

## 2012-10-22 NOTE — Op Note (Signed)
ICD generator removal and insertion of a new ICD with DFT testing. [id number].

## 2012-10-22 NOTE — Progress Notes (Signed)
Report received from [facility].  Transfer of pt care to me at this time.

## 2012-10-23 NOTE — Op Note (Signed)
[name], [name] NO.:  [id number]  MEDICAL RECORD NO.:  [id number]  LOCATION:  [facility]                         FACILITY:  [facility]  PHYSICIAN:  [name]. [name], MD    DATE OF BIRTH:  1936-03-09  DATE OF PROCEDURE:  10/22/2012 DATE OF DISCHARGE:  10/22/2012                              OPERATIVE REPORT   PROCEDURE PERFORMED:  Removal of a previously implanted biventricular implantable cardioverter-defibrillator and insertion of a new biventricular implantable cardioverter-defibrillator.  INDICATION:  Ischemic cardiomyopathy, status post implantable cardioverter-defibrillator implantation, ventricular tachycardia with the old device at elective replacement.  INTRODUCTION:  The patient is a very pleasant 77 year old man with a longstanding ischemic cardiomyopathy and chronic systolic heart failure, ventricular tachycardia, status post biventricular ICD implantation.  He is now referred for removal of his old device, which had reached elective replacement and insertion of a new device.  PROCEDURE:  After informed was obtained, the patient was taken to the diagnostic EP lab in a fasting state.  After usual preparation and draping, intravenous fentanyl and midazolam was given for sedation.  A 30 mL of lidocaine was infiltrated into the left infraclavicular region. A 7-cm incision was carried out over this region.  Electrocautery was utilized to dissect down to the fascial plane.  The ICD pocket was entered with electrocautery.  The generator was disconnected from the defibrillator lead and the pacing leads.  There was very extensive scar tissue.  The new Medtronic biventricular device was connected to the old pacing and defibrillation leads and placed back into the subcutaneous pocket.  The pocket was irrigated with antibiotic irrigation, and the incision was closed with 2-0 and 3-0 Vicryl.  At this point, I scrubbed out of the case and supervised defibrillation  threshold testing.  After the patient was more deeply sedated under my direct supervision, ventricular fibrillation was induced with a T-wave shock.  A 20-joule shock was then utilized to terminate ventricular fibrillation and restore sinus rhythm.  No other defibrillation threshold testing was carried out, and the benzoin and Steri-Strips were painted on the skin. A pressure dressing applied, and the patient was returned to his room in satisfactory condition.  COMPLICATIONS:  There were no immediate procedure.  RESULTS:  This demonstrates successful removal of previously implanted Medtronic biventricular implantable cardioverter-defibrillator and insertion of a new biventricular implantable cardioverter-defibrillator with defibrillation threshold testing.    [name]. [name], MD    GWT/MEDQ  D:  10/22/2012  T:  10/23/2012  Job:  [id number]

## 2012-10-31 ENCOUNTER — Encounter: Payer: Self-pay | Admitting: Internal Medicine

## 2012-10-31 ENCOUNTER — Ambulatory Visit (INDEPENDENT_AMBULATORY_CARE_PROVIDER_SITE_OTHER): Payer: Medicare Other | Admitting: *Deleted

## 2012-10-31 ENCOUNTER — Other Ambulatory Visit: Payer: Self-pay | Admitting: Internal Medicine

## 2012-10-31 DIAGNOSIS — I472 Ventricular tachycardia, unspecified: Secondary | ICD-10-CM

## 2012-10-31 DIAGNOSIS — I4891 Unspecified atrial fibrillation: Secondary | ICD-10-CM

## 2012-10-31 LAB — ICD DEVICE OBSERVATION
AL AMPLITUDE: 1.1 mv
LV LEAD IMPEDENCE ICD: 513 Ohm
RV LEAD IMPEDENCE ICD: 456 Ohm
RV LEAD THRESHOLD: 0.75 V
RV LEAD THRESHOLD: 0.75 V

## 2012-10-31 NOTE — Progress Notes (Signed)
Wound check defib in clinic  

## 2012-11-01 ENCOUNTER — Other Ambulatory Visit: Payer: Self-pay | Admitting: Pulmonary Disease

## 2012-11-01 MED ORDER — POTASSIUM CHLORIDE CRYS ER 20 MEQ PO TBCR: 20.0000 meq | EXTENDED_RELEASE_TABLET | Freq: Every day | ORAL | Status: DC

## 2012-11-04 ENCOUNTER — Telehealth: Payer: Self-pay | Admitting: Pulmonary Disease

## 2012-11-04 DIAGNOSIS — J449 Chronic obstructive pulmonary disease, unspecified: Secondary | ICD-10-CM

## 2012-11-04 NOTE — Telephone Encounter (Signed)
i advised coleen at last OB [facility] does not mention in his note. She stated she will call pt. Nothing further was needed

## 2012-11-04 NOTE — Telephone Encounter (Signed)
I spoke with pt. I advised him we did not tell [facility] he did not need O2. They just asked if [facility] documented any O2 use in his last OV note. Per pt he bought out of pocket from them a travel concentrator for $1500. Normally would cost $4500. Pt states it weighs about 10 lbs and it does pulse and cont flow. Pt just needs updated RX sent to them. Fax [phone]. Please advise current liter flow pt needs to be on. Thanks [facility]

## 2012-11-04 NOTE — Telephone Encounter (Signed)
2 liters with exertion and sleep

## 2012-11-04 NOTE — Telephone Encounter (Signed)
Order sent ATC PT line busy x 3wcb

## 2012-11-05 NOTE — Telephone Encounter (Signed)
Spoke w patient he is aware of this and nothing further needed at this time

## 2012-11-05 NOTE — Telephone Encounter (Signed)
ATC patient, no answer [id number] 

## 2012-12-04 ENCOUNTER — Telehealth: Payer: Self-pay | Admitting: Oncology

## 2012-12-04 NOTE — Telephone Encounter (Signed)
S/W PT IN RE NP APPT 06/17 @ 3:30 W/DR. SHERRILL REFERRING DR. Fayrene Fearing [place] DX- ABN PROTEIN IN THE BLOOD WELCOME PACKET MAILED.

## 2012-12-05 ENCOUNTER — Ambulatory Visit (INDEPENDENT_AMBULATORY_CARE_PROVIDER_SITE_OTHER): Payer: Medicare Other | Admitting: Internal Medicine

## 2012-12-05 ENCOUNTER — Encounter: Payer: Self-pay | Admitting: Internal Medicine

## 2012-12-05 ENCOUNTER — Telehealth: Payer: Self-pay | Admitting: Oncology

## 2012-12-05 VITALS — BP 160/60 | HR 66 | Temp 97.9°F | Ht 66.0 in | Wt 180.6 lb

## 2012-12-05 DIAGNOSIS — J441 Chronic obstructive pulmonary disease with (acute) exacerbation: Secondary | ICD-10-CM

## 2012-12-05 MED ORDER — AMOXICILLIN-POT CLAVULANATE 875-125 MG PO TABS: 1.0000 | ORAL_TABLET | Freq: Two times a day (BID) | ORAL | Status: DC

## 2012-12-05 MED ORDER — PREDNISONE (PAK) 10 MG PO TABS: ORAL_TABLET | ORAL | Status: DC

## 2012-12-05 NOTE — Telephone Encounter (Signed)
C/D 12/05/12 for appt. 12/17/12

## 2012-12-05 NOTE — Patient Instructions (Addendum)
Augmentin 875 twice daily with glass of water  Prednisone 10 mg take  4 each am x 2 days,   2 each am x 2 days,  1 each am thereafter as your baseline  Call if not improving - otherwise keep the original appt with Dr Shelle Iron

## 2012-12-05 NOTE — Assessment & Plan Note (Signed)
PFT's 2010:  FEV1 0.99 (36%), ratio 42, ++airtrapping, DLCO 54%  GOLD III at baseline with acute exac assoc with purulent sputum production  rec  augmentin x 10 days and boost prednisone temporarily   Each maintenance medication was reviewed in detail including most importantly the difference between maintenance and as needed and under what circumstances the prns are to be used.  Please see instructions for details which were reviewed in writing and the patient given a copy.

## 2012-12-05 NOTE — Progress Notes (Signed)
Subjective:     Patient ID: [name], male   DOB: 29-Mar-1936, 77 y.o.   MRN: [id number]  HPI  70 yowm quit smoking 2004 with severe copd/ 02 and steroid dep  12/05/2012 acute ov/Wert on pred 10 maint Chief Complaint  Patient presents with  . Acute Visit    Pt c/o increased SOB and prod cough with moderate green sputum x 4 days.    no sob at rest or sleeping.  No obvious daytime variabilty or assoc  cp or chest tightness, subjective wheeze overt sinus or hb symptoms. No unusual exp hx or h/o childhood pna/ asthma or premature birth to his knowledge.  Sleeping ok without nocturnal  or early am exacerbation  of respiratory  c/o's or need for noct saba. Also denies any obvious fluctuation of symptoms with weather or environmental changes or other aggravating or alleviating factors except as outlined above   Current Medications, Allergies, Past Medical History, Past Surgical History, Family History, and Social History were reviewed in [employer] record.  ROS  The following are not active complaints unless bolded sore throat, dysphagia, dental problems, itching, sneezing,  nasal congestion or excess/ purulent secretions, ear ache,   fever, chills, sweats, unintended wt loss, pleuritic or exertional cp, hemoptysis,  orthopnea pnd or leg swelling, presyncope, palpitations, heartburn, abdominal pain, anorexia, nausea, vomiting, diarrhea  or change in bowel or urinary habits, change in stools or urine, dysuria,hematuria,  rash, arthralgias, visual complaints, headache, numbness weakness or ataxia or problems with walking or coordination,  change in mood/affect or memory.       Review of Systems     Objective:   Physical Exam   mod hoarse anxious wm nad Wt Readings from Last 3 Encounters:  12/05/12 180 lb 9.6 oz (81.92 kg)  10/22/12 175 lb (79.379 kg)  10/22/12 175 lb (79.379 kg)   HEENT mild turbinate edema.  Oropharynx no thrush or excess pnd or cobblestoning.   No JVD or cervical adenopathy. Mild accessory muscle hypertrophy. Trachea midline, nl thryroid. Chest was hyperinflated by percussion with diminished breath sounds and moderate increased exp time without wheeze. Hoover sign positive at mid inspiration. Regular rate and rhythm without murmur gallop or rub or increase P2 or edema.  Abd: no hsm, nl excursion. Ext warm without cyanosis or clubbing.   Assessment:           Plan:

## 2012-12-17 ENCOUNTER — Ambulatory Visit: Payer: Medicare Other

## 2012-12-17 ENCOUNTER — Encounter: Payer: Self-pay | Admitting: Oncology

## 2012-12-17 ENCOUNTER — Telehealth: Payer: Self-pay | Admitting: Oncology

## 2012-12-17 ENCOUNTER — Ambulatory Visit (HOSPITAL_BASED_OUTPATIENT_CLINIC_OR_DEPARTMENT_OTHER): Payer: Medicare Other | Admitting: Oncology

## 2012-12-17 VITALS — BP 120/57 | HR 84 | Temp 98.1°F | Resp 18 | Ht 66.0 in | Wt 180.1 lb

## 2012-12-17 DIAGNOSIS — D649 Anemia, unspecified: Secondary | ICD-10-CM

## 2012-12-17 NOTE — Progress Notes (Signed)
Checked in new patient. No financial issues. Didn't ask about POA/living will.

## 2012-12-17 NOTE — Progress Notes (Signed)
[facility] New Patient Consult   Referring MD: Rashawn Rayman 77 y.o.  1935-12-03    Reason for Referral: Anemia, serum monoclonal protein     HPI: He underwent placement of a new defibrillator in April of 2014. He was noted to have anemia with a hemoglobin of 9.4 on 10/14/2012. Mr. Devaul was referred to Dr. Earl Gala. A CBC on 11/18/2012 family hemolytic 9.8, white count 9.8, platelets 205,000, and the MCV at 86.5. The neutrophil count returned at 7.9 with an absolute lymphocyte count 0.9. The ferritin was measured at 45.4. A serum protein electrophoresis revealed a 1.1 g/dL M spike. A serum immunofixation on 12/02/2012 confirmed a monoclonal IgM kappa protein. The IgM level was elevated at 1723 with an IgG of 571 and IgA of 29.  Mr. Batrez reports feeling well. He had an upper respiratory infection one to 2 weeks ago and was treated with antibiotics by Dr. Shelle Iron. He reports a history of anemia in the past. He states the anemia responded to iron in the past. Ms. Ferron indicates he underwent a bone marrow biopsy approximately 25 years ago.  Past Medical History  Diagnosis Date  . Hypokalemia   . Colon cancer-stage II A. (T3 N0) descending colon   July 2005   . PVD (peripheral vascular disease)   . HTN (hypertension)   . Automatic implantable cardiac defibrillator in situ   . CHF (congestive heart failure)   . Ventricular tachycardia   . Ischemic heart disease, chronic   . Dyslipidemia   . CAD (coronary artery disease)   . COPD (chronic obstructive pulmonary disease)   . Emphysema   .  subdural hematoma on 2 occasions after falling when getting up in the bathroom    . Stroke-TIA    . Pacemaker   . Self-catheterizes urinary bladder     since back surgery  . Difficult intubation     pt states "not aware of difficult intubation, never told"    Past Surgical History  Procedure Laterality Date  . Lumbar back surgery      x2  .  Cholecystectomy    . Iliac artery stent   October 2009     left  . Implantation of medtronic concerto bi-v icd  04-10-08    [name] MD  . Colonoscopy    . Polypectomy    . Insertion of iliac stent  06/04/2012    right external   . Cardiac catheterization    . Eye surgery      cataracts  . Insert / replace / remove pacemaker    . Endarterectomy femoral  06/28/2012    Procedure: ENDARTERECTOMY FEMORAL;  Surgeon: Nada Libman, MD;  Location: [facility] OR;  Service: Vascular;  Laterality: Left;  . Patch angioplasty  06/28/2012    Procedure: PATCH ANGIOPLASTY;  Surgeon: Nada Libman, MD;  Location: [facility] OR;  Service: Vascular;  Laterality: Left;  Using 0.8 cm x 15.2 cm Hemashield Patch.    Family History  Problem Relation Age of Onset  . Heart failure Father   . Heart disease Mother     before age 35  . Hyperlipidemia Mother   . Hypertension Mother     .   Mouth cancer                                   father-smoker  Current outpatient prescriptions:amLODipine (  NORVASC) 5 MG tablet, Take 5 mg by mouth daily.  , Disp: , Rfl: ;  aspirin 325 MG tablet, Take 325 mg by mouth daily., Disp: , Rfl: ;  budesonide (PULMICORT) 0.5 MG/2ML nebulizer solution, Take 0.5 mg by nebulization 2 (two) times daily. , Disp: , Rfl: ;  candesartan (ATACAND) 32 MG tablet, Take 32 mg by mouth daily.  , Disp: , Rfl:  carvedilol (COREG) 12.5 MG tablet, Take 12.5 mg by mouth 2 (two) times daily with a meal.  , Disp: , Rfl: ;  digoxin (LANOXIN) 0.125 MG tablet, Take 125 mcg by mouth daily.  , Disp: , Rfl: ;  ferrous sulfate 325 (65 FE) MG tablet, Take 325 mg by mouth at bedtime. , Disp: , Rfl: ;  furosemide (LASIX) 40 MG tablet, Take 40-80 mg by mouth 2 (two) times daily. Take 2 tablets in the morning and 1 tablet in the evening, Disp: , Rfl:  guaiFENesin (MUCINEX) 600 MG 12 hr tablet, Take 600 mg by mouth 2 (two) times daily as needed. congestion, Disp: , Rfl: ;  Misc. Devices (ACAPELLA) MISC, Use flutter valve as  directed, Disp: 1 each, Rfl: 0;  nitroGLYCERIN (NITRODUR - DOSED IN MG/24 HR) 0.2 mg/hr, Place 1 patch onto the skin daily. , Disp: , Rfl: ;  potassium chloride SA (K-DUR,KLOR-CON) 20 MEQ tablet, Take 1 tablet (20 mEq total) by mouth daily., Disp: 90 tablet, Rfl: 3 predniSONE (DELTASONE) 10 MG tablet, Take 1 tablet (10 mg total) by mouth daily., Disp: 30 tablet, Rfl: 6;  simvastatin (ZOCOR) 20 MG tablet, Take 20 mg by mouth at bedtime.  , Disp: , Rfl: ;  tiotropium (SPIRIVA) 18 MCG inhalation capsule, Place 18 mcg into inhaler and inhale daily.  , Disp: , Rfl:  albuterol (PROAIR HFA) 108 (90 BASE) MCG/ACT inhaler, Inhale 1-2 puffs into the lungs every 6 (six) hours as needed. Shortness of breath, Disp: 9 Inhaler, Rfl: 4;  albuterol (PROVENTIL) (2.5 MG/3ML) 0.083% nebulizer solution, Take 2.5 mg by nebulization 4 (four) times daily. , Disp: , Rfl: ;  HYDROcodone-acetaminophen (VICODIN) 2.5-500 MG per tablet, Take 1 tablet by mouth every 6 (six) hours as needed. Back pain, Disp: , Rfl:  nitroGLYCERIN (NITROSTAT) 0.4 MG SL tablet, Place 0.4 mg under the tongue every 5 (five) minutes as needed. May repeat x3 For chest pain, Disp: , Rfl:   Allergies: No Active Allergies  Social History: He lives with his wife in [place]. He quit smoking cigarettes in 2004. Rare alcohol use. He is retired from [employer]. He also worked as a Location manager. No transfusion history. No risk factor for HIV or hepatitis.  ROS:   Positives include: Occasional nausea relieved with ginger ale, intermittent diarrhea and emesis, neurogenic bladder-self catheterization, upper respiratory infection 1 week ago  A complete ROS was otherwise negative.  Physical Exam:  Blood pressure 120/57, pulse 84, temperature 98.1 F (36.7 C), temperature source Oral, resp. rate 18, height 5\' 6"  (1.676 m), weight 180 lb 1.6 oz (81.693 kg).  HEENT: Upper denture plate, oropharynx without visible mass, neck without mass Lungs: Distant  breath sounds Cardiac: Regular rate and rhythm Abdomen: No hepatosplenomegaly GU: Uncircumcised male, testes without mass  Vascular: No leg edema, chronic stasis change bilaterally, the left lower leg is slightly larger than the right side (she reports this has been the case since undergoing vascular surgery) Lymph nodes: No cervical, supra-clavicular, axillary, or inguinal nodes Neurologic:  Alert and oriented, the motor exam appears intact in  the upper and lower extremities Skin: No rash, several superficial ulcers at the right lower leg with chronic stasis change at the low leg bilaterally Musculoskeletal: No spine tenderness   LAB:  CBC  Lab Results  Component Value Date   WBC 8.3 10/14/2012   HGB 9.4* 10/14/2012   HCT 29.1* 10/14/2012   MCV 86.7 10/14/2012   PLT 202.0 10/14/2012     CMP      Component Value Date/Time   NA 137 10/14/2012 1328   K 3.9 10/14/2012 1328   CL 97 10/14/2012 1328   CO2 33* 10/14/2012 1328   GLUCOSE 202* 10/14/2012 1328   BUN 16 10/14/2012 1328   CREATININE 0.9 10/14/2012 1328   CALCIUM 8.8 10/14/2012 1328   PROT 7.6 06/18/2012 1030   ALBUMIN 3.5 06/18/2012 1030   AST 16 06/18/2012 1030   ALT 12 06/18/2012 1030   ALKPHOS 77 06/18/2012 1030   BILITOT 0.5 06/18/2012 1030   GFRNONAA 52* 06/29/2012 0425   GFRAA 60* 06/29/2012 0425   Labs from Dr. Earl Gala as per history of present illness  Radiology: None    Assessment/Plan:   1. Normocytic anemia 2. Serum monoclonal IgM kappa protein 3. Low IgG and IgA levels 4. COPD-oxygen dependent 5. CAD 6. History of CHF 7. Peripheral vascular disease 8. Defibrillator    Disposition:   Mr. Monforte is referred for evaluation of anemia in the setting of a serum monoclonal IgM protein. He has a complex medical history. He may have an underlying lymphoproliferative disorder such as Waldenstrom's macroglobulinemia or IgM multiple myeloma. I discussed the differential diagnosis with Mr. Mceuen and his  wife. He agrees to a diagnostic bone marrow biopsy. We will schedule additional studies such as CT scans and a serum light chain analysis based on the bone marrow result.  He will be scheduled for the bone marrow biopsy during the week of 12/23/2012. He will return for an office visit on 12/27/2012.  Julia Alkhatib 12/17/2012, 5:49 PM

## 2012-12-18 ENCOUNTER — Encounter (HOSPITAL_COMMUNITY): Payer: Self-pay | Admitting: Pharmacy Technician

## 2012-12-19 ENCOUNTER — Other Ambulatory Visit: Payer: Self-pay | Admitting: Radiology

## 2012-12-23 ENCOUNTER — Ambulatory Visit (HOSPITAL_COMMUNITY)
Admission: RE | Admit: 2012-12-23 | Discharge: 2012-12-23 | Disposition: A | Discharge status: Home / Self Care | Payer: Medicare Other | Source: Ambulatory Visit | Attending: Oncology | Admitting: Oncology

## 2012-12-23 ENCOUNTER — Encounter (HOSPITAL_COMMUNITY): Payer: Self-pay

## 2012-12-23 DIAGNOSIS — Z79899 Other long term (current) drug therapy: Secondary | ICD-10-CM | POA: Insufficient documentation

## 2012-12-23 DIAGNOSIS — I2510 Atherosclerotic heart disease of native coronary artery without angina pectoris: Secondary | ICD-10-CM | POA: Insufficient documentation

## 2012-12-23 DIAGNOSIS — Z8673 Personal history of transient ischemic attack (TIA), and cerebral infarction without residual deficits: Secondary | ICD-10-CM | POA: Insufficient documentation

## 2012-12-23 DIAGNOSIS — I10 Essential (primary) hypertension: Secondary | ICD-10-CM | POA: Insufficient documentation

## 2012-12-23 DIAGNOSIS — Z95810 Presence of automatic (implantable) cardiac defibrillator: Secondary | ICD-10-CM | POA: Insufficient documentation

## 2012-12-23 DIAGNOSIS — Z9981 Dependence on supplemental oxygen: Secondary | ICD-10-CM | POA: Insufficient documentation

## 2012-12-23 DIAGNOSIS — J438 Other emphysema: Secondary | ICD-10-CM | POA: Insufficient documentation

## 2012-12-23 DIAGNOSIS — D649 Anemia, unspecified: Secondary | ICD-10-CM

## 2012-12-23 LAB — CBC
HCT: 30.1 % — ABNORMAL LOW (ref 39.0–52.0)
MCH: 28.1 pg (ref 26.0–34.0)
MCV: 87.2 fL (ref 78.0–100.0)
Platelets: 195 10*3/uL (ref 150–400)
RBC: 3.45 MIL/uL — ABNORMAL LOW (ref 4.22–5.81)

## 2012-12-23 MED ORDER — FENTANYL CITRATE 0.05 MG/ML IJ SOLN
INTRAMUSCULAR | Status: AC | PRN
  Administered 2012-12-23: 100 ug via INTRAVENOUS

## 2012-12-23 MED ORDER — MIDAZOLAM HCL 2 MG/2ML IJ SOLN
INTRAMUSCULAR | Status: AC | PRN
  Administered 2012-12-23 (×2): 1 mg via INTRAVENOUS

## 2012-12-23 MED ORDER — MIDAZOLAM HCL 2 MG/2ML IJ SOLN
INTRAMUSCULAR | Status: AC
  Filled 2012-12-23: qty 4

## 2012-12-23 MED ORDER — SODIUM CHLORIDE 0.9 % IV SOLN
INTRAVENOUS | Status: DC
  Administered 2012-12-23: 07:00:00 via INTRAVENOUS

## 2012-12-23 MED ORDER — FENTANYL CITRATE 0.05 MG/ML IJ SOLN
INTRAMUSCULAR | Status: AC
  Filled 2012-12-23: qty 4

## 2012-12-23 NOTE — H&P (Signed)
[name] is an 77 y.o. male.   Chief Complaint: "I'm getting a bone marrow biopsy" HPI: Patient with history of anemia and monoclonal M spike on protein electrophoresis presents today for CT guided bone marrow biopsy.  Past Medical History  Diagnosis Date  . Hypokalemia   . Colon cancer   . PVD (peripheral vascular disease)   . HTN (hypertension)   . Automatic implantable cardiac defibrillator in situ   . CHF (congestive heart failure)   . Ventricular tachycardia   . Ischemic heart disease, chronic   . Dyslipidemia   . CAD (coronary artery disease)   . COPD (chronic obstructive pulmonary disease)   . Emphysema   . On home oxygen therapy   . Stroke   . Pacemaker   . Self-catheterizes urinary bladder     since back surgery  . Difficult intubation     pt states "not aware of difficult intubation, never told"    Past Surgical History  Procedure Laterality Date  . Lumbar back surgery      x2  . Cholecystectomy    . Iliac artery stent      left  . Implantation of medtronic concerto bi-v icd  04-10-08    [name] MD  . Colonoscopy    . Polypectomy    . Insertion of iliac stent  06/04/2012    right external   . Cardiac catheterization    . Eye surgery      cataracts  . Insert / replace / remove pacemaker    . Endarterectomy femoral  06/28/2012    Procedure: ENDARTERECTOMY FEMORAL;  Surgeon: Nada Libman, MD;  Location: [facility] OR;  Service: Vascular;  Laterality: Left;  . Patch angioplasty  06/28/2012    Procedure: PATCH ANGIOPLASTY;  Surgeon: Nada Libman, MD;  Location: [facility] OR;  Service: Vascular;  Laterality: Left;  Using 0.8 cm x 15.2 cm Hemashield Patch.    Family History  Problem Relation Age of Onset  . Heart failure Father   . Heart disease Mother     before age 63  . Hyperlipidemia Mother   . Hypertension Mother    Social History:  reports that he quit smoking about 10 years ago. His smoking use included Cigarettes. He has a 144 pack-year smoking  history. He quit smokeless tobacco use about 10 years ago. He reports that  drinks alcohol. He reports that he does not use illicit drugs.  Allergies: No Active Allergies  Current outpatient prescriptions:albuterol (PROAIR HFA) 108 (90 BASE) MCG/ACT inhaler, Inhale 1-2 puffs into the lungs every 6 (six) hours as needed. Shortness of breath, Disp: 9 Inhaler, Rfl: 4;  albuterol (PROVENTIL) (2.5 MG/3ML) 0.083% nebulizer solution, Take 2.5 mg by nebulization 4 (four) times daily. , Disp: , Rfl: ;  amLODipine (NORVASC) 5 MG tablet, Take 5 mg by mouth every morning. , Disp: , Rfl:  aspirin 325 MG tablet, Take 325 mg by mouth daily., Disp: , Rfl: ;  budesonide (PULMICORT) 0.5 MG/2ML nebulizer solution, Take 0.5 mg by nebulization 2 (two) times daily. , Disp: , Rfl: ;  candesartan (ATACAND) 32 MG tablet, Take 32 mg by mouth daily.  , Disp: , Rfl: ;  carvedilol (COREG) 12.5 MG tablet, Take 12.5 mg by mouth 2 (two) times daily with a meal.  , Disp: , Rfl:  digoxin (LANOXIN) 0.125 MG tablet, Take 125 mcg by mouth every morning. , Disp: , Rfl: ;  ferrous sulfate 325 (65 FE) MG tablet, Take 325  mg by mouth at bedtime. , Disp: , Rfl: ;  furosemide (LASIX) 40 MG tablet, Take 40-80 mg by mouth 2 (two) times daily. Take 2 tablets in the morning and 1 tablet in the evening, Disp: , Rfl: ;  guaiFENesin (MUCINEX) 600 MG 12 hr tablet, Take 600 mg by mouth 2 (two) times daily as needed. congestion, Disp: , Rfl:  Misc. Devices (ACAPELLA) MISC, Use flutter valve as directed, Disp: 1 each, Rfl: 0;  nitroGLYCERIN (NITRODUR - DOSED IN MG/24 HR) 0.2 mg/hr, Place 1 patch onto the skin daily. , Disp: , Rfl: ;  potassium chloride SA (K-DUR,KLOR-CON) 20 MEQ tablet, Take 1 tablet (20 mEq total) by mouth daily., Disp: 90 tablet, Rfl: 3;  predniSONE (DELTASONE) 10 MG tablet, Take 1 tablet (10 mg total) by mouth daily., Disp: 30 tablet, Rfl: 6 simvastatin (ZOCOR) 20 MG tablet, Take 20 mg by mouth at bedtime.  , Disp: , Rfl: ;  tiotropium  (SPIRIVA) 18 MCG inhalation capsule, Place 18 mcg into inhaler and inhale daily. , Disp: , Rfl: ;  HYDROcodone-acetaminophen (VICODIN) 2.5-500 MG per tablet, Take 1 tablet by mouth every 6 (six) hours as needed. Back pain, Disp: , Rfl:  nitroGLYCERIN (NITROSTAT) 0.4 MG SL tablet, Place 0.4 mg under the tongue every 5 (five) minutes as needed. May repeat x3 For chest pain, Disp: , Rfl:  Current facility-administered medications:0.9 %  sodium chloride infusion, , Intravenous, Continuous, Brayton El, PA-C, Last Rate: 20 mL/hr at 12/23/12 0715   Results for orders placed during the hospital encounter of 12/23/12 (from the past 48 hour(s))  CBC     Status: Abnormal   Collection Time    12/23/12  7:15 AM      Result Value Range   WBC 8.7  4.0 - 10.5 K/uL   RBC 3.45 (*) 4.22 - 5.81 MIL/uL   Hemoglobin 9.7 (*) 13.0 - 17.0 g/dL   HCT 16.1 (*) 09.6 - 04.5 %   MCV 87.2  78.0 - 100.0 fL   MCH 28.1  26.0 - 34.0 pg   MCHC 32.2  30.0 - 36.0 g/dL   RDW 40.9  81.1 - 91.4 %   Platelets 195  150 - 400 K/uL  PROTIME-INR     Status: None   Collection Time    12/23/12  7:15 AM      Result Value Range   Prothrombin Time 13.6  11.6 - 15.2 seconds   INR 1.05  0.00 - 1.49   No results found.  Review of Systems  Constitutional: Negative for fever and chills.  Respiratory: Positive for shortness of breath. Negative for cough.   Cardiovascular: Negative for chest pain.  Gastrointestinal: Negative for nausea, vomiting and abdominal pain.  Musculoskeletal: Negative for back pain.  Neurological: Negative for headaches.    Blood pressure 139/51, pulse 65, temperature 98.1 F (36.7 C), temperature source Oral, resp. rate 16, height 5\' 6"  (1.676 m), weight 180 lb (81.647 kg), SpO2 99.00%. Physical Exam  Constitutional: He is oriented to person, place, and time. He appears well-developed and well-nourished.  Cardiovascular: Normal rate and regular rhythm.   Defibrillator in place  Respiratory: Effort  normal.  Distant BS bilat  GI: Soft. Bowel sounds are normal. There is no tenderness.  Musculoskeletal: Normal range of motion. He exhibits no edema.  Neurological: He is alert and oriented to person, place, and time.     Assessment/Plan Patient with hx of anemia in setting of a serum monoclonal IgM protein. Plan is  for CT guided bone marrow biopsy today. Details/risks of procedure d/w pt /wife with their understanding and consent.  Paitlyn Mcclatchey,D KEVIN 12/23/2012, 8:09 AM

## 2012-12-23 NOTE — Procedures (Signed)
Technically successful CT guided bone marrow aspiration and biopsy of left iliac crest. No immediate complications. Awaiting pathology report.    

## 2012-12-27 ENCOUNTER — Telehealth: Payer: Self-pay | Admitting: Oncology

## 2012-12-27 ENCOUNTER — Ambulatory Visit (HOSPITAL_BASED_OUTPATIENT_CLINIC_OR_DEPARTMENT_OTHER): Payer: Medicare Other | Admitting: Oncology

## 2012-12-27 VITALS — Temp 97.8°F | Resp 17 | Ht 66.0 in | Wt 179.5 lb

## 2012-12-27 DIAGNOSIS — N2889 Other specified disorders of kidney and ureter: Secondary | ICD-10-CM

## 2012-12-27 DIAGNOSIS — N289 Disorder of kidney and ureter, unspecified: Secondary | ICD-10-CM

## 2012-12-27 DIAGNOSIS — D649 Anemia, unspecified: Secondary | ICD-10-CM

## 2012-12-27 NOTE — Progress Notes (Signed)
   [facility]    OFFICE PROGRESS NOTE   INTERVAL HISTORY:   He underwent a bone marrow biopsy on 12/23/2012. He reports tolerating the procedure well. The pathology confirmed a slightly hypercellular marrow. No increase in blast and no significant dysplastic changes were identified. The plasma cells returned at 5% on a manual differential. Storage iron is present.  Mild soreness at the bone marrow site. No other complaint.  Objective:  Vital signs in last 24 hours:  Temperature 97.8 F (36.6 C), temperature source Oral, resp. rate 17, height 5\' 6"  (1.676 m), weight 179 lb 8 oz (81.421 kg).    Resp: Prolonged expiratory phase, no respiratory distress Cardio: Regular rhythm GI: No hepatosplenomegaly Vascular: The left lower leg is slightly larger than the right side.   Lab Results:  Lab Results  Component Value Date   WBC 8.7 12/23/2012   HGB 9.7* 12/23/2012   HCT 30.1* 12/23/2012   MCV 87.2 12/23/2012   PLT 195 12/23/2012      Medications: I have reviewed the patient's current medications.  Assessment/Plan:  1. Normocytic anemia-bone marrow biopsy on 12/24/2012 with a slightly hypercellular marrow and no evidence of a lymphoproliferative disorder, cytogenetics are pending  2. Serum monoclonal IgM kappa protein  3. Low IgG and IgA levels  4. Oxygen dependent COPD  5. History of coronary artery disease  6. History of congestive heart failure  7. Peripheral vascular disease  8. Defibrillator  9. Colon cancer 2005-stage II (T3 N0)  10. CT of the abdomen 08/30/2009-upper pole left kidney lesion measuring 12 x 12 mm-indeterminate, increased from 2008 and 2010    Disposition:  [name] has a normocytic anemia in association with a serum monoclonal IgM kappa protein. The bone marrow biopsy was not diagnostic of multiple myeloma or lymphoma. 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 The anemia could be  related to myelodysplasia or renal insufficiency.  I discussed the differential diagnosis with [name] and his wife. We decided to follow him with observation for now. He will return for a CBC, erythropoietin level, serum light chain analysis, and repeat IgM level in approximately 6 weeks.  I reviewed the 2011 abdomen CT with a radiologist. We will obtain a repeat CT to evaluate the left renal lesion. We will ask him to return for a chemistry panel prior to the CT.   Thornton Papas, MD  12/27/2012  3:44 PM

## 2012-12-30 ENCOUNTER — Telehealth: Payer: Self-pay | Admitting: *Deleted

## 2012-12-30 NOTE — Telephone Encounter (Signed)
Called pt he is aware of lab appt and CT scan. Encouraged him to call office with any questions.

## 2012-12-31 LAB — CHROMOSOME ANALYSIS, BONE MARROW

## 2012-12-31 LAB — TISSUE HYBRIDIZATION (BONE MARROW)-NCBH

## 2013-01-01 ENCOUNTER — Other Ambulatory Visit (HOSPITAL_COMMUNITY): Payer: Medicare Other

## 2013-01-01 ENCOUNTER — Other Ambulatory Visit: Payer: Medicare Other

## 2013-01-01 ENCOUNTER — Other Ambulatory Visit (HOSPITAL_BASED_OUTPATIENT_CLINIC_OR_DEPARTMENT_OTHER): Payer: Medicare Other | Admitting: Lab

## 2013-01-01 DIAGNOSIS — D649 Anemia, unspecified: Secondary | ICD-10-CM

## 2013-01-01 DIAGNOSIS — N289 Disorder of kidney and ureter, unspecified: Secondary | ICD-10-CM

## 2013-01-01 DIAGNOSIS — N2889 Other specified disorders of kidney and ureter: Secondary | ICD-10-CM

## 2013-01-01 LAB — BASIC METABOLIC PANEL (CC13)
BUN: 14.5 mg/dL (ref 7.0–26.0)
Chloride: 97 mEq/L — ABNORMAL LOW (ref 98–109)
Potassium: 4.1 mEq/L (ref 3.5–5.1)
Sodium: 138 mEq/L (ref 136–145)

## 2013-01-07 ENCOUNTER — Other Ambulatory Visit: Payer: Self-pay | Admitting: *Deleted

## 2013-01-07 DIAGNOSIS — C189 Malignant neoplasm of colon, unspecified: Secondary | ICD-10-CM

## 2013-01-08 ENCOUNTER — Ambulatory Visit (HOSPITAL_COMMUNITY)
Admission: RE | Admit: 2013-01-08 | Discharge: 2013-01-08 | Disposition: A | Discharge status: Home / Self Care | Payer: Medicare Other | Source: Ambulatory Visit | Attending: Oncology | Admitting: Oncology

## 2013-01-08 ENCOUNTER — Other Ambulatory Visit (HOSPITAL_BASED_OUTPATIENT_CLINIC_OR_DEPARTMENT_OTHER): Payer: Medicare Other | Admitting: Lab

## 2013-01-08 ENCOUNTER — Other Ambulatory Visit: Payer: Self-pay | Admitting: Oncology

## 2013-01-08 ENCOUNTER — Other Ambulatory Visit: Payer: Medicare Other | Admitting: Lab

## 2013-01-08 ENCOUNTER — Encounter (HOSPITAL_COMMUNITY): Payer: Self-pay

## 2013-01-08 DIAGNOSIS — N2889 Other specified disorders of kidney and ureter: Secondary | ICD-10-CM

## 2013-01-08 DIAGNOSIS — N289 Disorder of kidney and ureter, unspecified: Secondary | ICD-10-CM | POA: Insufficient documentation

## 2013-01-08 DIAGNOSIS — D649 Anemia, unspecified: Secondary | ICD-10-CM

## 2013-01-08 DIAGNOSIS — R599 Enlarged lymph nodes, unspecified: Secondary | ICD-10-CM | POA: Insufficient documentation

## 2013-01-08 DIAGNOSIS — I700 Atherosclerosis of aorta: Secondary | ICD-10-CM | POA: Insufficient documentation

## 2013-01-08 DIAGNOSIS — C189 Malignant neoplasm of colon, unspecified: Secondary | ICD-10-CM

## 2013-01-08 DIAGNOSIS — Z95810 Presence of automatic (implantable) cardiac defibrillator: Secondary | ICD-10-CM | POA: Insufficient documentation

## 2013-01-08 LAB — COMPREHENSIVE METABOLIC PANEL (CC13)
Albumin: 3.5 g/dL (ref 3.5–5.0)
BUN: 21.1 mg/dL (ref 7.0–26.0)
CO2: 33 mEq/L — ABNORMAL HIGH (ref 22–29)
Calcium: 9.3 mg/dL (ref 8.4–10.4)
Chloride: 98 mEq/L (ref 98–109)
Glucose: 117 mg/dl (ref 70–140)
Potassium: 4.1 mEq/L (ref 3.5–5.1)
Total Protein: 7.5 g/dL (ref 6.4–8.3)

## 2013-01-08 MED ORDER — IOHEXOL 300 MG/ML  SOLN
100.0000 mL | Freq: Once | INTRAMUSCULAR | Status: AC | PRN
  Administered 2013-01-08: 100 mL via INTRAVENOUS

## 2013-01-09 ENCOUNTER — Telehealth: Payer: Self-pay | Admitting: *Deleted

## 2013-01-09 ENCOUNTER — Other Ambulatory Visit: Payer: Self-pay | Admitting: Pulmonary Disease

## 2013-01-09 NOTE — Telephone Encounter (Signed)
Message copied by Caleb Popp on Thu Jan 09, 2013 12:00 PM ------      Message from: Thornton Papas B      Created: Wed Jan 08, 2013  9:10 PM       Please call patient, kidney lesion appears to be a cyst, f/u as scheduled ------

## 2013-01-09 NOTE — Telephone Encounter (Signed)
Called pt, kidney lesion seen on scan appears to be a cyst, per Dr. Truett Perna. Pt stated he's known that for 10 years. Voiced appreciation for call. Next appt confirmed.

## 2013-01-24 ENCOUNTER — Encounter: Payer: Self-pay | Admitting: Surgery

## 2013-01-27 ENCOUNTER — Other Ambulatory Visit (INDEPENDENT_AMBULATORY_CARE_PROVIDER_SITE_OTHER): Payer: Medicare Other | Admitting: Vascular Surgery

## 2013-01-27 ENCOUNTER — Ambulatory Visit (INDEPENDENT_AMBULATORY_CARE_PROVIDER_SITE_OTHER): Payer: Medicare Other | Admitting: Surgery

## 2013-01-27 ENCOUNTER — Encounter (INDEPENDENT_AMBULATORY_CARE_PROVIDER_SITE_OTHER): Payer: Medicare Other | Admitting: Vascular Surgery

## 2013-01-27 ENCOUNTER — Encounter: Payer: Self-pay | Admitting: Surgery

## 2013-01-27 VITALS — BP 133/46 | HR 62 | Resp 16 | Ht 67.0 in | Wt 181.0 lb

## 2013-01-27 DIAGNOSIS — I70219 Atherosclerosis of native arteries of extremities with intermittent claudication, unspecified extremity: Secondary | ICD-10-CM

## 2013-01-27 DIAGNOSIS — I6529 Occlusion and stenosis of unspecified carotid artery: Secondary | ICD-10-CM

## 2013-01-27 DIAGNOSIS — L98499 Non-pressure chronic ulcer of skin of other sites with unspecified severity: Secondary | ICD-10-CM

## 2013-01-27 DIAGNOSIS — M79609 Pain in unspecified limb: Secondary | ICD-10-CM | POA: Insufficient documentation

## 2013-01-27 DIAGNOSIS — I739 Peripheral vascular disease, unspecified: Secondary | ICD-10-CM

## 2013-01-27 DIAGNOSIS — Z48812 Encounter for surgical aftercare following surgery on the circulatory system: Secondary | ICD-10-CM

## 2013-01-27 DIAGNOSIS — I7025 Atherosclerosis of native arteries of other extremities with ulceration: Secondary | ICD-10-CM | POA: Insufficient documentation

## 2013-01-27 NOTE — Progress Notes (Signed)
[facility] [place]   Patient name: [name] MRN: [id number] DOB: May 22, 1936 Sex: male     Chief Complaint  Patient presents with  . Carotid    C/O  Bilateral leg ulcers, pain, duration 2-3 mo.  Marland Kitchen PVD    HISTORY OF PRESENT ILLNESS: The patient is back today for followup. On 06/28/2012 he underwent left common femoral, superficial femoral, and profunda femoral endarterectomy with background patch angioplasty. This was done in the setting of severe left leg claudication. We have discussed multiple options including femoral-popliteal bypass grafting, however because of his comorbidities I elected to proceed with femoral endarterectomy as I felt this would alleviate his symptoms. He has also undergone stenting of his left common iliac artery and right external iliac artery. He developed bilateral lower extremity ulcerations approximately 2 months ago. He saw a dermatologist who was putting some cream on them. He felt this was not working and he ended up putting mupirocin cream on them. He states that they are scabbing over and getting better. He has been keeping his leg elevated as he has significant lower extremity edema.  Past Medical History  Diagnosis Date  . Hypokalemia   . Colon cancer   . PVD (peripheral vascular disease)   . HTN (hypertension)   . Automatic implantable cardiac defibrillator in situ   . CHF (congestive heart failure)   . Ventricular tachycardia   . Ischemic heart disease, chronic   . Dyslipidemia   . CAD (coronary artery disease)   . COPD (chronic obstructive pulmonary disease)   . Emphysema   . On home oxygen therapy   . Stroke   . Pacemaker   . Self-catheterizes urinary bladder     since back surgery  . Difficult intubation     pt states "not aware of difficult intubation, never told"    Past Surgical History  Procedure Laterality Date  . Lumbar back surgery      x2  . Cholecystectomy    . Iliac artery stent      left  .  Implantation of medtronic concerto bi-v icd  04-10-08    [name] MD  . Colonoscopy    . Polypectomy    . Insertion of iliac stent  06/04/2012    right external   . Cardiac catheterization    . Eye surgery      cataracts  . Insert / replace / remove pacemaker    . Endarterectomy femoral  06/28/2012    Procedure: ENDARTERECTOMY FEMORAL;  Surgeon: Nada Libman, MD;  Location: [facility] OR;  Service: Vascular;  Laterality: Left;  . Patch angioplasty  06/28/2012    Procedure: PATCH ANGIOPLASTY;  Surgeon: Nada Libman, MD;  Location: [facility] OR;  Service: Vascular;  Laterality: Left;  Using 0.8 cm x 15.2 cm Hemashield Patch.    History   Social History  . Marital Status: Married    Spouse Name: N/A    Number of Children: N/A  . Years of Education: N/A   Occupational History  .      Retired Company secretary   Social History Main Topics  . Smoking status: Former Smoker -- 3.00 packs/day for 48 years    Types: Cigarettes    Quit date: 07/03/2002  . Smokeless tobacco: Former Neurosurgeon    Quit date: 07/11/2002  . Alcohol Use: Yes     Comment: occasional  . Drug Use: No  . Sexually Active: Not on file   Other Topics  Concern  . Not on file   Social History Narrative   Married, wife [name]   Oxygen-continuous   I/O self cath since back surgery    Family History  Problem Relation Age of Onset  . Heart failure Father   . Heart disease Mother     before age 23  . Hyperlipidemia Mother   . Hypertension Mother     Allergies as of 01/27/2013  . (No Active Allergies)    Current Outpatient Prescriptions on File Prior to Visit  Medication Sig Dispense Refill  . albuterol (PROAIR HFA) 108 (90 BASE) MCG/ACT inhaler Inhale 1-2 puffs into the lungs every 6 (six) hours as needed. Shortness of breath  9 Inhaler  4  . albuterol (PROVENTIL) (2.5 MG/3ML) 0.083% nebulizer solution Take 2.5 mg by nebulization 4 (four) times daily.       Marland Kitchen amLODipine (NORVASC) 5 MG tablet Take 5  mg by mouth every morning.       Marland Kitchen aspirin 325 MG tablet Take 325 mg by mouth daily.      . budesonide (PULMICORT) 0.5 MG/2ML nebulizer solution Take 0.5 mg by nebulization 2 (two) times daily.       . candesartan (ATACAND) 32 MG tablet Take 32 mg by mouth daily.        . carvedilol (COREG) 25 MG tablet Take 25 mg by mouth 2 (two) times daily with a meal.      . digoxin (LANOXIN) 0.125 MG tablet Take 125 mcg by mouth every morning.       . ferrous sulfate 325 (65 FE) MG tablet Take 325 mg by mouth at bedtime.       . furosemide (LASIX) 40 MG tablet Take 40-80 mg by mouth 2 (two) times daily. Take 2 tablets in the morning and 1 tablet in the evening      . guaiFENesin (MUCINEX) 600 MG 12 hr tablet Take 600 mg by mouth 2 (two) times daily as needed. congestion      . HYDROcodone-acetaminophen (VICODIN) 2.5-500 MG per tablet Take 1 tablet by mouth every 6 (six) hours as needed. Back pain      . Misc. Devices (ACAPELLA) MISC Use flutter valve as directed  1 each  0  . nitroGLYCERIN (NITRODUR - DOSED IN MG/24 HR) 0.2 mg/hr Place 1 patch onto the skin daily.       . nitroGLYCERIN (NITROSTAT) 0.4 MG SL tablet Place 0.4 mg under the tongue every 5 (five) minutes as needed. May repeat x3 For chest pain      . potassium chloride SA (K-DUR,KLOR-CON) 20 MEQ tablet Take 1 tablet (20 mEq total) by mouth daily.  90 tablet  3  . predniSONE (DELTASONE) 10 MG tablet TAKE 1 TABLET BY MOUTH ONCE DAILY  30 tablet  1  . simvastatin (ZOCOR) 20 MG tablet Take 20 mg by mouth at bedtime.        Marland Kitchen tiotropium (SPIRIVA) 18 MCG inhalation capsule Place 18 mcg into inhaler and inhale daily.        No current facility-administered medications on file prior to visit.     REVIEW OF SYSTEMS: Cardiovascular: Positive for bilateral leg swelling. Pulmonary: No productive cough, asthma or wheezing. Neurologic: No weakness, paresthesias, aphasia, or amaurosis. No dizziness. Hematologic: No bleeding problems or clotting  disorders. Musculoskeletal: No joint pain or joint swelling. Gastrointestinal: No blood in stool or hematemesis Genitourinary: No dysuria or hematuria. Psychiatric:: No history of major depression. Integumentary: Bilateral  ulcers Constitutional: No fever or chills.  PHYSICAL EXAMINATION:   Vital signs are BP 133/46  Pulse 62  Resp 16  Ht 5\' 7"  (1.702 m)  Wt 181 lb (82.101 kg)  BMI 28.34 kg/m2  SpO2 99% General: The patient appears their stated age. HEENT:  No gross abnormalities Pulmonary:  Non labored breathing Musculoskeletal: There are no major deformities. Neurologic: No focal weakness or paresthesias are detected, Skin: Ulcer on the lateral side of the right leg and medial and lateral side of the left leg. Psychiatric: The patient has normal affect. Cardiovascular: There is a regular rate and rhythm without significant murmur appreciated. Pedal pulses are nonpalpable. He does have 2+ edema bilaterally  Diagnostic Studies Carotid ultrasound: 40-59% right carotid stenosis, less than 40% left carotid stenosis.  ABI: 1.35 on the left but monophasic waveforms. 0.65 on the right with monophasic waveforms. Toe pressure is 35 on the right and undetectable on the left  Assessment: Bilateral lower  extremity ulceration Plan: The patient has had ongoing ulcers for 2 months which he states are getting better. Because of the patient's multiple comorbidities I would like to exhaust non-interventional strategies before putting it through another procedure to improve blood flow. I do feel that his ulcers are secondary to poor circulation, however with minimal wound care they have been improving. I am therefore going to refer him to the wound center for formal wound care. He will followup with me in one month. If he has had no change or a deterioration in the ulcers, I would proceed with angiography. I still believe he has percutaneous options to improve his blood flow, however these will not be  straightforward and technically will be challenging, therefore I would like to try not procedural based strategies first. I did discuss with the patient and his wife that these are threatening ulcers.  Jorge Ny, M.D. [facility] [place] Office: [phone] Pager:  [phone]

## 2013-01-28 NOTE — Addendum Note (Signed)
Addended by: Sharee Pimple on: 01/28/2013 09:32 AM   Modules accepted: Orders

## 2013-01-31 ENCOUNTER — Encounter (HOSPITAL_BASED_OUTPATIENT_CLINIC_OR_DEPARTMENT_OTHER): Payer: Medicare Other | Attending: General Surgery

## 2013-01-31 DIAGNOSIS — J4489 Other specified chronic obstructive pulmonary disease: Secondary | ICD-10-CM | POA: Insufficient documentation

## 2013-01-31 DIAGNOSIS — I739 Peripheral vascular disease, unspecified: Secondary | ICD-10-CM | POA: Insufficient documentation

## 2013-01-31 DIAGNOSIS — J449 Chronic obstructive pulmonary disease, unspecified: Secondary | ICD-10-CM | POA: Insufficient documentation

## 2013-01-31 DIAGNOSIS — I10 Essential (primary) hypertension: Secondary | ICD-10-CM | POA: Insufficient documentation

## 2013-01-31 DIAGNOSIS — L97809 Non-pressure chronic ulcer of other part of unspecified lower leg with unspecified severity: Secondary | ICD-10-CM | POA: Insufficient documentation

## 2013-01-31 DIAGNOSIS — I2510 Atherosclerotic heart disease of native coronary artery without angina pectoris: Secondary | ICD-10-CM | POA: Insufficient documentation

## 2013-01-31 DIAGNOSIS — Z950 Presence of cardiac pacemaker: Secondary | ICD-10-CM | POA: Insufficient documentation

## 2013-01-31 DIAGNOSIS — Z9981 Dependence on supplemental oxygen: Secondary | ICD-10-CM | POA: Insufficient documentation

## 2013-01-31 DIAGNOSIS — Z951 Presence of aortocoronary bypass graft: Secondary | ICD-10-CM | POA: Insufficient documentation

## 2013-02-01 NOTE — Progress Notes (Signed)
[facility]  NAME:  [name], DAFT NO.:  [id number]  MEDICAL RECORD NO.:  [id number]      DATE OF BIRTH:  1936-03-06  PHYSICIAN:  Ardath Sax, M.D.      VISIT DATE:  01/31/2013                                  OFFICE VISIT   HISTORY:  This gentleman is 77 years of age and has many health issues including severe COPD where he is on oxygen 24 hours a day.  He also has peripheral vascular disease, hypertension, ventricular tachycardia, ischemic heart disease.  He has a pacemaker.  He has had back surgery, cholecystectomy.  He has had multiple arterial surgeries including iliac arterial stents and he has had femoral-popliteal bypasses.  He has also had femoral endarterectomies and he has had a coronary artery bypass graft.  MEDICATIONS:  His medicines include besides the oxygen, carvedilol, digoxin, Lasix, nitroglycerin, potassium chloride, prednisone, simvastatin, and Spiriva.  He is also on amlodipine.  He comes here today with vascular disease bilaterally with bilateral ulcers.  PHYSICAL EXAMINATION:  VITAL SIGNS:  His blood pressure is 120/69, respirations 16, pulse 88, temperature 98.  He weighs 180 pounds.  He is 5 feet 7 inches.  On examination, I felt that these are ischemic ulcers, needed to be debrided, so I debrided down the subcu on both legs and we are going to treat him with Santyl and silver alginate.  I do not feel any pulses in his feet and his feet are cool, but they seem to have adequate capillary filling.  We put him in the [facility] with silver alginate and will see him back here in a week.  DIAGNOSIS:  Severe peripheral vascular disease, coronary artery disease, hypertension, chronic obstructive pulmonary disease.     Ardath Sax, M.D.     PP/MEDQ  D:  01/31/2013  T:  02/01/2013  Job:  [id number]

## 2013-02-03 ENCOUNTER — Encounter: Payer: Self-pay | Admitting: *Deleted

## 2013-02-04 ENCOUNTER — Ambulatory Visit (INDEPENDENT_AMBULATORY_CARE_PROVIDER_SITE_OTHER): Payer: Medicare Other | Admitting: Internal Medicine

## 2013-02-04 ENCOUNTER — Encounter: Payer: Self-pay | Admitting: Internal Medicine

## 2013-02-04 VITALS — BP 140/67 | HR 77 | Ht 67.0 in | Wt 181.8 lb

## 2013-02-04 DIAGNOSIS — I4891 Unspecified atrial fibrillation: Secondary | ICD-10-CM

## 2013-02-04 DIAGNOSIS — I5022 Chronic systolic (congestive) heart failure: Secondary | ICD-10-CM

## 2013-02-04 DIAGNOSIS — I472 Ventricular tachycardia, unspecified: Secondary | ICD-10-CM

## 2013-02-04 DIAGNOSIS — I10 Essential (primary) hypertension: Secondary | ICD-10-CM

## 2013-02-04 DIAGNOSIS — I2510 Atherosclerotic heart disease of native coronary artery without angina pectoris: Secondary | ICD-10-CM

## 2013-02-04 DIAGNOSIS — Z95810 Presence of automatic (implantable) cardiac defibrillator: Secondary | ICD-10-CM

## 2013-02-04 DIAGNOSIS — I509 Heart failure, unspecified: Secondary | ICD-10-CM

## 2013-02-04 LAB — ICD DEVICE OBSERVATION
AL AMPLITUDE: 0.8 mv
ATRIAL PACING ICD: 31 pct
LV LEAD IMPEDENCE ICD: 475 Ohm
LV LEAD THRESHOLD: 1 V
RV LEAD IMPEDENCE ICD: 418 Ohm
TZON-0003SLOWVT: 428.5 ms

## 2013-02-04 NOTE — Progress Notes (Signed)
HPI [name] returns today for followup. He is a 77 year old man with a history of atrial fibrillation, and ischemic cardiomyopathy, chronic systolic heart failure, ventricular tachycardia, and oxygen-dependent COPD. He denies any recent ICD shocks. Because of a history of bleeding, he is felt not to be a candidate for Coumadin. The patient admits to some peripheral edema, and dietary indiscretion. He has class III heart failure symptoms though his heart failure and COPD both contribute to his chronic dyspnea. He's had no recent ICD shock. He denies fevers or chills, cough or hemoptysis. No Active Allergies   Current Outpatient Prescriptions  Medication Sig Dispense Refill  . albuterol (PROAIR HFA) 108 (90 BASE) MCG/ACT inhaler Inhale 1-2 puffs into the lungs every 6 (six) hours as needed. Shortness of breath  9 Inhaler  4  . albuterol (PROVENTIL) (2.5 MG/3ML) 0.083% nebulizer solution Take 2.5 mg by nebulization 4 (four) times daily.       Marland Kitchen amLODipine (NORVASC) 5 MG tablet Take 5 mg by mouth every morning.       Marland Kitchen aspirin 325 MG tablet Take 325 mg by mouth daily.      . budesonide (PULMICORT) 0.5 MG/2ML nebulizer solution Take 0.5 mg by nebulization 2 (two) times daily.       . candesartan (ATACAND) 32 MG tablet Take 32 mg by mouth daily.        . carvedilol (COREG) 25 MG tablet Take 25 mg by mouth 2 (two) times daily with a meal.      . collagenase (SANTYL) ointment Apply 1 application topically daily.      . digoxin (LANOXIN) 0.125 MG tablet Take 125 mcg by mouth every morning.       . ferrous sulfate 325 (65 FE) MG tablet Take 325 mg by mouth at bedtime.       . furosemide (LASIX) 40 MG tablet Take 40-80 mg by mouth 2 (two) times daily. Take 2 tablets in the morning and 1 tablet in the evening      . guaiFENesin (MUCINEX) 600 MG 12 hr tablet Take 600 mg by mouth 2 (two) times daily as needed. congestion      . HYDROcodone-acetaminophen (VICODIN) 2.5-500 MG per tablet Take 1 tablet by  mouth every 6 (six) hours as needed. Back pain      . nitroGLYCERIN (NITRODUR - DOSED IN MG/24 HR) 0.2 mg/hr Place 1 patch onto the skin daily.       . polyethylene glycol (MIRALAX / GLYCOLAX) packet Take 17 g by mouth as needed.      . potassium chloride SA (K-DUR,KLOR-CON) 20 MEQ tablet Take 1 tablet (20 mEq total) by mouth daily.  90 tablet  3  . predniSONE (DELTASONE) 10 MG tablet TAKE 1 TABLET BY MOUTH ONCE DAILY  30 tablet  1  . simvastatin (ZOCOR) 20 MG tablet Take 20 mg by mouth at bedtime.        Marland Kitchen tiotropium (SPIRIVA) 18 MCG inhalation capsule Place 18 mcg into inhaler and inhale daily.        No current facility-administered medications for this visit.     Past Medical History  Diagnosis Date  . Hypokalemia   . Colon cancer   . PVD (peripheral vascular disease)   . HTN (hypertension)   . Automatic implantable cardiac defibrillator in situ   . CHF (congestive heart failure)   . Ventricular tachycardia   . Ischemic heart disease, chronic   . Dyslipidemia   . CAD (coronary artery disease)   .  COPD (chronic obstructive pulmonary disease)   . Emphysema   . On home oxygen therapy   . Stroke   . Pacemaker   . Self-catheterizes urinary bladder     since back surgery  . Difficult intubation     pt states "not aware of difficult intubation, never told"    ROS:   All systems reviewed and negative except as noted in the HPI.   Past Surgical History  Procedure Laterality Date  . Lumbar back surgery      x2  . Cholecystectomy    . Iliac artery stent      left  . Implantation of medtronic concerto bi-v icd  04-10-08    Robert Bunting MD  . Colonoscopy    . Polypectomy    . Insertion of iliac stent  06/04/2012    right external   . Cardiac catheterization    . Eye surgery      cataracts  . Insert / replace / remove pacemaker    . Endarterectomy femoral  06/28/2012    Procedure: ENDARTERECTOMY FEMORAL;  Surgeon: Robert Libman, MD;  Location: [facility] OR;  Service:  Vascular;  Laterality: Left;  . Patch angioplasty  06/28/2012    Procedure: PATCH ANGIOPLASTY;  Surgeon: Robert Libman, MD;  Location: [facility] OR;  Service: Vascular;  Laterality: Left;  Using 0.8 cm x 15.2 cm Hemashield Patch.     Family History  Problem Relation Age of Onset  . Heart failure Father   . Heart disease Mother     before age 57  . Hyperlipidemia Mother   . Hypertension Mother      History   Social History  . Marital Status: Married    Spouse Name: N/A    Number of Children: N/A  . Years of Education: N/A   Occupational History  .      Retired Company secretary   Social History Main Topics  . Smoking status: Former Smoker -- 3.00 packs/day for 48 years    Types: Cigarettes    Quit date: 07/03/2002  . Smokeless tobacco: Former Neurosurgeon    Quit date: 07/11/2002  . Alcohol Use: Yes     Comment: occasional  . Drug Use: No  . Sexually Active: Not on file   Other Topics Concern  . Not on file   Social History Narrative   Married, wife [name]   Oxygen-continuous   I/O self cath since back surgery     BP 140/67  Pulse 77  Ht 5\' 7"  (1.702 m)  Wt 181 lb 12.8 oz (82.464 kg)  BMI 28.47 kg/m2  Physical Exam:  Chronically ill appearing  77 year old man, wearing oxygen by nasal cannula, NAD HEENT: Unremarkable except as above Neck:  No JVD, no thyromegally Lungs:  Clear with no wheezes, or rhonchi. Basilar rales are present bilaterally. No increased work of breathing HEART:  Regular rate rhythm, no murmurs, no rubs, no clicks Abd:  soft, positive bowel sounds, no organomegally, no rebound, no guarding Ext:  2 plus pulses, 1+ peripheral edema, no cyanosis, no clubbing Skin:  No rashes no nodules Neuro:  CN II through XII intact, motor grossly intact  EKG  DEVICE  Normal device function.  See PaceArt for details.   Assess/Plan:

## 2013-02-04 NOTE — Assessment & Plan Note (Signed)
The patient has chronic atrial fibrillation. Because of his history of subdural hematoma, he is not thought to be a candidate for anticoagulation.

## 2013-02-04 NOTE — Assessment & Plan Note (Signed)
His blood pressure today is only slightly elevated. I've encouraged him to reduce his salt intake.

## 2013-02-04 NOTE — Assessment & Plan Note (Signed)
He remains volume overloaded by exam and his fluid index is elevated. I've recommended that the patient up titrate his diuretic therapy for 3 days by taking 120 mg of Lasix in the morning and 80 mg in the evening. If his weight goes down by more than 5 pounds, he is instructed to go back to his usual diuretic dose. I've encouraged the patient to reduce his salt intake.

## 2013-02-04 NOTE — Patient Instructions (Addendum)
Your physician wants you to follow-up in: 12 months with Dr Court Joy will receive a reminder letter in the mail two months in advance. If you don't receive a letter, please call our office to schedule the follow-up appointment.    Remote monitoring is used to monitor your Pacemaker or ICD from home. This monitoring reduces the number of office visits required to check your device to one time per year. It allows [place] to keep an eye on the functioning of your device to ensure it is working properly. You are scheduled for a device check from home on 05/12/13. You may send your transmission at any time that day. If you have a wireless device, the transmission will be sent automatically. After your physician reviews your transmission, you will receive a postcard with your next transmission date.  Increase Furosemide to 3 tablets in the morning and 2 tablets in the afternoon for 3 days then go back to regular dose

## 2013-02-04 NOTE — Assessment & Plan Note (Signed)
He denies anginal symptoms. He will continue his current medical therapy. 

## 2013-02-06 ENCOUNTER — Telehealth: Payer: Self-pay | Admitting: Oncology

## 2013-02-06 ENCOUNTER — Ambulatory Visit (HOSPITAL_BASED_OUTPATIENT_CLINIC_OR_DEPARTMENT_OTHER): Payer: Medicare Other | Admitting: Oncology

## 2013-02-06 ENCOUNTER — Other Ambulatory Visit (HOSPITAL_BASED_OUTPATIENT_CLINIC_OR_DEPARTMENT_OTHER): Payer: Medicare Other | Admitting: Lab

## 2013-02-06 VITALS — BP 115/45 | HR 83 | Temp 98.1°F | Resp 17 | Ht 67.0 in | Wt 180.2 lb

## 2013-02-06 DIAGNOSIS — N289 Disorder of kidney and ureter, unspecified: Secondary | ICD-10-CM

## 2013-02-06 DIAGNOSIS — D649 Anemia, unspecified: Secondary | ICD-10-CM

## 2013-02-06 DIAGNOSIS — N2889 Other specified disorders of kidney and ureter: Secondary | ICD-10-CM

## 2013-02-06 LAB — CBC & DIFF AND RETIC
BASO%: 0.2 % (ref 0.0–2.0)
EOS%: 0.3 % (ref 0.0–7.0)
MCH: 27.8 pg (ref 27.2–33.4)
MCV: 88.9 fL (ref 79.3–98.0)
MONO%: 6.7 % (ref 0.0–14.0)
RBC: 3.52 10*6/uL — ABNORMAL LOW (ref 4.20–5.82)
RDW: 15.5 % — ABNORMAL HIGH (ref 11.0–14.6)
Retic %: 1.24 % (ref 0.80–1.80)
Retic Ct Abs: 43.65 10*3/uL (ref 34.80–93.90)

## 2013-02-06 LAB — BASIC METABOLIC PANEL (CC13)
BUN: 24.1 mg/dL (ref 7.0–26.0)
CO2: 31 mEq/L — ABNORMAL HIGH (ref 22–29)
Calcium: 9.4 mg/dL (ref 8.4–10.4)
Chloride: 98 mEq/L (ref 98–109)
Creatinine: 1 mg/dL (ref 0.7–1.3)

## 2013-02-06 NOTE — Telephone Encounter (Signed)
gv and printed appt sched and avs for pt  °

## 2013-02-06 NOTE — Progress Notes (Signed)
   [facility]    OFFICE PROGRESS NOTE   INTERVAL HISTORY:   He returns as scheduled. No new complaint. He cut his left ear while trimming his hair earlier today. The ear continues to bleed.  Objective:  Vital signs in last 24 hours:  Blood pressure 115/45, pulse 83, temperature 98.1 F (36.7 C), temperature source Oral, resp. rate 17, height 5\' 7"  (1.702 m), weight 180 lb 3.2 oz (81.738 kg), SpO2 97.00%.    HEENT: Small laceration at the upper left ear with oozing of blood -have placed a bandage over this small lesion, neck without mass Lymphatics: No cervical, supraclavicular, axillary, or inguinal nodes Resp: Distant breath sounds with inspiratory rhonchi at the lower posterior chest bilaterally, no respiratory distress Cardio: Regular rate and rhythm GI: No hepatosplenomegaly Vascular: No leg edema   Lab Results:  Lab Results  Component Value Date   WBC 9.1 02/06/2013   HGB 9.8* 02/06/2013   HCT 31.3* 02/06/2013   MCV 88.9 02/06/2013   PLT 178 02/06/2013   ANC 7.7  X-rays: CT of the abdomen on 01/08/2013, compared to 08/30/2009-2.3 cm lesion in the upper pole left kidney measured 1.2 cm on the previous study. Several new hypoattenuating kidney lesion. The dominant left kidney lesion appears to be a cyst. Mild lymphadenopathy at the  hepatoduodenal ligament and retroperitoneum has not changed significantly over a three-year interval    Medications: I have reviewed the patient's current medications.  Assessment/Plan: 1. Normocytic anemia-bone marrow biopsy on 12/24/2012 with a slightly hypercellular marrow and no evidence of a lymphoproliferative disorder, negative myeloma fish panel. The hemoglobin is stable. 2. Serum monoclonal IgM kappa protein  3. Low IgG and IgA levels  4. Oxygen dependent COPD  5. History of coronary artery disease  6. History of congestive heart failure  7. Peripheral vascular disease  8. Defibrillator  9. Colon cancer 2005-stage II  (T3 N0)  10. CT of the abdomen 08/30/2009-upper pole left kidney lesion measuring 12 x 12 mm-indeterminate, increased from 2008 and 2010, felt to be a cyst on the CT 01/08/2013   Disposition:  He appears stable from a hematologic standpoint. He appears asymptomatic from the anemia. We obtained an erythropoietin level, repeat IgM level, and serum light chain analysis today. I suspect the anemia is related to "chronic disease ", myelodysplasia, or renal insufficiency. We plan for a trial of erythropoietin therapy if the hemoglobin falls.   Thornton Papas, MD  02/06/2013  12:42 PM

## 2013-02-07 ENCOUNTER — Ambulatory Visit: Payer: Medicare Other | Admitting: Pulmonary Disease

## 2013-02-10 LAB — PROTEIN ELECTROPHORESIS, SERUM
Alpha-1-Globulin: 4.9 % (ref 2.9–4.9)
Alpha-2-Globulin: 12.9 % — ABNORMAL HIGH (ref 7.1–11.8)
Beta 2: 3.8 % (ref 3.2–6.5)
Gamma Globulin: 19.3 % — ABNORMAL HIGH (ref 11.1–18.8)
M-Spike, %: 0.9 g/dL

## 2013-02-10 LAB — KAPPA/LAMBDA LIGHT CHAINS: Lambda Free Lght Chn: 1.25 mg/dL (ref 0.57–2.63)

## 2013-02-10 LAB — ERYTHROPOIETIN: Erythropoietin: 13.8 m[IU]/mL (ref 2.6–18.5)

## 2013-02-12 ENCOUNTER — Encounter: Payer: Self-pay | Admitting: Pulmonary Disease

## 2013-02-12 ENCOUNTER — Ambulatory Visit (INDEPENDENT_AMBULATORY_CARE_PROVIDER_SITE_OTHER): Payer: Medicare Other | Admitting: Pulmonary Disease

## 2013-02-12 VITALS — BP 136/60 | HR 87 | Temp 97.9°F | Ht 67.0 in | Wt 189.6 lb

## 2013-02-12 DIAGNOSIS — J449 Chronic obstructive pulmonary disease, unspecified: Secondary | ICD-10-CM

## 2013-02-12 MED ORDER — PREDNISONE 10 MG PO TABS: 10.0000 mg | ORAL_TABLET | Freq: Every day | ORAL | Status: DC

## 2013-02-12 NOTE — Patient Instructions (Addendum)
No change in medications. Keep weighing everyday, and stay as active as possible. followup with me in 4mos.

## 2013-02-12 NOTE — Progress Notes (Signed)
  Subjective:    Patient ID: [name], male    DOB: May 30, 1936, 77 y.o.   MRN: [id number]  HPI Patient comes in today for followup of his chronic respiratory failure secondary to COPD.  He also has a known cardiomyopathy.  He has been well since the last visit, although he did have one episode of acute bronchitis which required a course of antibiotics.  He has since returned to baseline, and feels that he is doing fairly well.  He denies any significant cough or chest congestion.   Review of Systems  Constitutional: Negative for fever and unexpected weight change.  HENT: Negative for ear pain, nosebleeds, congestion, sore throat, rhinorrhea, sneezing, trouble swallowing, dental problem, postnasal drip and sinus pressure.   Eyes: Negative for redness and itching.  Respiratory: Negative for cough, chest tightness, shortness of breath and wheezing.   Cardiovascular: Negative for palpitations and leg swelling.  Gastrointestinal: Negative for nausea and vomiting.  Genitourinary: Negative for dysuria.  Musculoskeletal: Negative for joint swelling.  Skin: Negative for rash.  Neurological: Negative for headaches.  Hematological: Does not bruise/bleed easily.  Psychiatric/Behavioral: Negative for dysphoric mood. The patient is not nervous/anxious.        Objective:   Physical Exam Well-developed male in no acute distress Nose without purulence or discharge noted Neck without lymphadenopathy or thyromegaly Chest with decreased breath sounds, no significant crackles or wheezes Cardiac exam regular rate and rhythm Lower extremities with 1+ edema, no cyanosis Alert and oriented, moves all 4 extremities.       Assessment & Plan:

## 2013-02-12 NOTE — Assessment & Plan Note (Signed)
The patient appears to be stable from a pulmonary standpoint.  I've asked him to continue with his current bronchodilator regimen, and to try and stay as active as possible.  He is also to weigh daily, and let his cardiologist if his weight begins to increase.

## 2013-02-24 ENCOUNTER — Telehealth: Payer: Self-pay | Admitting: Oncology

## 2013-02-24 NOTE — Telephone Encounter (Signed)
Due to day after call moved 10/31 lb/BS to 11/3. S/w pt re change he is aware of new d/t.

## 2013-03-05 ENCOUNTER — Other Ambulatory Visit: Payer: Self-pay | Admitting: Pulmonary Disease

## 2013-03-07 ENCOUNTER — Encounter: Payer: Self-pay | Admitting: Surgery

## 2013-03-07 ENCOUNTER — Encounter (HOSPITAL_BASED_OUTPATIENT_CLINIC_OR_DEPARTMENT_OTHER): Payer: Medicare Other | Attending: General Surgery

## 2013-03-07 DIAGNOSIS — L97809 Non-pressure chronic ulcer of other part of unspecified lower leg with unspecified severity: Secondary | ICD-10-CM | POA: Insufficient documentation

## 2013-03-07 DIAGNOSIS — I739 Peripheral vascular disease, unspecified: Secondary | ICD-10-CM | POA: Insufficient documentation

## 2013-03-10 ENCOUNTER — Other Ambulatory Visit: Payer: Self-pay

## 2013-03-10 ENCOUNTER — Encounter: Payer: Self-pay | Admitting: Surgery

## 2013-03-10 ENCOUNTER — Ambulatory Visit (INDEPENDENT_AMBULATORY_CARE_PROVIDER_SITE_OTHER): Payer: Medicare Other | Admitting: Surgery

## 2013-03-10 VITALS — BP 142/69 | HR 76 | Temp 98.0°F | Ht 67.0 in | Wt 183.0 lb

## 2013-03-10 DIAGNOSIS — I739 Peripheral vascular disease, unspecified: Secondary | ICD-10-CM

## 2013-03-10 MED ORDER — LEVOFLOXACIN 500 MG PO TABS: 500.0000 mg | ORAL_TABLET | Freq: Every day | ORAL | Status: DC

## 2013-03-10 NOTE — Progress Notes (Signed)
[facility] [place]   Patient name: [name] MRN: [id number] DOB: 05/07/1936 Sex: male     Chief Complaint  Patient presents with  . Re-evaluation    1 month f/u wound check    HISTORY OF PRESENT ILLNESS: The patient is back today for followup. On 06/28/2012 he underwent left common femoral, superficial femoral, and profunda femoral endarterectomy with background patch angioplasty. This was done in the setting of severe left leg claudication. We have discussed multiple options including femoral-popliteal bypass grafting, however because of his comorbidities I elected to proceed with femoral endarterectomy as I felt this would alleviate his symptoms. He has also undergone stenting of his left common iliac artery and right external iliac artery. He developed bilateral lower extremity ulcerations approximately 2 months ago. Because of his comorbidities I elected to exhaust noninvasive treatments to see if we could get his wounds to heal. He was referred to the wound center. He has been undergoing wound care for the past month. The wound on his left leg have essentially healed. On the right leg, however he has 2 large ulcers which have not improved. He complains of rest pain particularly when elevating his legs.   Past Medical History  Diagnosis Date  . Hypokalemia   . Colon cancer   . PVD (peripheral vascular disease)   . HTN (hypertension)   . Automatic implantable cardiac defibrillator in situ   . CHF (congestive heart failure)   . Ventricular tachycardia   . Ischemic heart disease, chronic   . Dyslipidemia   . CAD (coronary artery disease)   . COPD (chronic obstructive pulmonary disease)   . Emphysema   . On home oxygen therapy   . Stroke   . Pacemaker   . Self-catheterizes urinary bladder     since back surgery  . Difficult intubation     pt states "not aware of difficult intubation, never told"    Past Surgical History  Procedure Laterality Date   . Lumbar back surgery      x2  . Cholecystectomy    . Iliac artery stent      left  . Implantation of medtronic concerto bi-v icd  04-10-08    Gregg Taylor MD  . Colonoscopy    . Polypectomy    . Insertion of iliac stent  06/04/2012    right external   . Cardiac catheterization    . Eye surgery      cataracts  . Insert / replace / remove pacemaker    . Endarterectomy femoral  06/28/2012    Procedure: ENDARTERECTOMY FEMORAL;  Surgeon: Katelin Kutsch W Tryphena Perkovich, MD;  Location: [facility] OR;  Service: Vascular;  Laterality: Left;  . Patch angioplasty  06/28/2012    Procedure: PATCH ANGIOPLASTY;  Surgeon: Jehieli Brassell W Lilyonna Steidle, MD;  Location: [facility] OR;  Service: Vascular;  Laterality: Left;  Using 0.8 cm x 15.2 cm Hemashield Patch.    History   Social History  . Marital Status: Married    Spouse Name: N/A    Number of Children: N/A  . Years of Education: N/A   Occupational History  .      Retired [employer]   Social History Main Topics  . Smoking status: Former Smoker -- 3.00 packs/day for 48 years    Types: Cigarettes    Quit date: 07/03/2002  . Smokeless tobacco: Former User    Quit date: 07/11/2002  . Alcohol Use: Yes     Comment: occasional  .   Drug Use: No  . Sexual Activity: Not on file   Other Topics Concern  . Not on file   Social History Narrative   Married, wife Sarah   Retired-[employer]   Oxygen-continuous   I/O self cath since back surgery    Family History  Problem Relation Age of Onset  . Heart failure Father   . Heart disease Mother     before age 60  . Hyperlipidemia Mother   . Hypertension Mother     Allergies as of 03/10/2013  . (No Known Allergies)    Current Outpatient Prescriptions on File Prior to Visit  Medication Sig Dispense Refill  . albuterol (PROAIR HFA) 108 (90 BASE) MCG/ACT inhaler Inhale 1-2 puffs into the lungs every 6 (six) hours as needed. Shortness of breath  9 Inhaler  4  . albuterol (PROVENTIL) (2.5 MG/3ML) 0.083% nebulizer solution Take  2.5 mg by nebulization 4 (four) times daily.       . amLODipine (NORVASC) 5 MG tablet Take 5 mg by mouth every morning.       . aspirin 325 MG tablet Take 325 mg by mouth daily.      . budesonide (PULMICORT) 0.5 MG/2ML nebulizer solution Take 0.5 mg by nebulization 2 (two) times daily.       . candesartan (ATACAND) 32 MG tablet Take 32 mg by mouth daily.        . carvedilol (COREG) 25 MG tablet Take 25 mg by mouth 2 (two) times daily with a meal.      . collagenase (SANTYL) ointment Apply 1 application topically daily.      . digoxin (LANOXIN) 0.125 MG tablet Take 125 mcg by mouth every morning.       . ferrous sulfate 325 (65 FE) MG tablet Take 325 mg by mouth at bedtime.       . furosemide (LASIX) 40 MG tablet Take 40-80 mg by mouth 2 (two) times daily. Take 2 tablets in the morning and 1 tablet in the evening      . guaiFENesin (MUCINEX) 600 MG 12 hr tablet Take 600 mg by mouth 2 (two) times daily as needed. congestion      . HYDROcodone-acetaminophen (VICODIN) 2.5-500 MG per tablet Take 1 tablet by mouth every 6 (six) hours as needed. Back pain      . nitroGLYCERIN (NITRODUR - DOSED IN MG/24 HR) 0.2 mg/hr Place 1 patch onto the skin daily.       . polyethylene glycol (MIRALAX / GLYCOLAX) packet Take 17 g by mouth as needed.      . potassium chloride SA (K-DUR,KLOR-CON) 20 MEQ tablet Take 1 tablet (20 mEq total) by mouth daily.  90 tablet  3  . predniSONE (DELTASONE) 10 MG tablet Take 1 tablet (10 mg total) by mouth daily.  90 tablet  4  . predniSONE (DELTASONE) 10 MG tablet TAKE 1 TABLET BY MOUTH ONCE DAILY  30 tablet  3  . simvastatin (ZOCOR) 20 MG tablet Take 20 mg by mouth at bedtime.        . tiotropium (SPIRIVA) 18 MCG inhalation capsule Place 18 mcg into inhaler and inhale daily.        No current facility-administered medications on file prior to visit.     REVIEW OF SYSTEMS: Please see history of present illness, otherwise no changes from prior visit  PHYSICAL EXAMINATION:    Vital signs are BP 142/69  Pulse 76  Temp(Src) 98 F (36.7 C) (Oral)  Ht   5' 7" (1.702 m)  Wt 183 lb (83.008 kg)  BMI 28.66 kg/m2  SpO2 96% General: The patient appears their stated age. HEENT:  No gross abnormalities Pulmonary:  Non labored breathing Abdomen: Soft and non-tender Musculoskeletal: There are no major deformities. Neurologic: No focal weakness or paresthesias are detected, Skin: 2, approximately 2 cm ulcers on the right lateral leg with surrounding erythema Psychiatric: The patient has normal affect. Cardiovascular: There is a regular rate and rhythm without significant murmur appreciated. Pedal pulses are not palpable   Diagnostic Studies None  Assessment: Bilateral lower extremity ulceration, right greater than left Plan: The patient has had good progress on healing of his left leg ulcers. Unfortunately the right leg has not shown significant improvement. In addition, I feel that he has a superficial cellulitis on the right leg. I have given him a prescription for Levaquin to help with the cellulitis. I feel he needs to undergo  revascularization of the right leg for a chance at limb salvage. I have reviewed his angiogram from December 2013. He has a right superficial femoral occlusion. I feel that it will be difficult to revascularize him for percutaneous perspective, however I feel that I need to try before recommending femoral-popliteal bypass grafting. This procedure has been scheduled for next Tuesday, September 16. I did discuss the risks of the procedure which include distal embolization, need for emergent surgery, and risk of limb loss. He is currently on 325 aspirin which I will continue. V. Wells Elaisha Zahniser IV, M.D. [facility] [place] Office: [phone] Pager:  [phone]   

## 2013-03-12 ENCOUNTER — Encounter (HOSPITAL_COMMUNITY): Payer: Self-pay | Admitting: Pharmacy Technician

## 2013-03-18 ENCOUNTER — Other Ambulatory Visit (HOSPITAL_COMMUNITY): Payer: Self-pay | Admitting: *Deleted

## 2013-03-18 ENCOUNTER — Other Ambulatory Visit: Payer: Self-pay | Admitting: *Deleted

## 2013-03-18 ENCOUNTER — Ambulatory Visit (HOSPITAL_COMMUNITY): Payer: Medicare Other

## 2013-03-18 ENCOUNTER — Encounter (HOSPITAL_COMMUNITY)
Admission: RE | Disposition: A | Discharge status: Home / Self Care | Payer: Self-pay | Source: Ambulatory Visit | Attending: Surgery

## 2013-03-18 ENCOUNTER — Telehealth: Payer: Self-pay | Admitting: Pulmonary Disease

## 2013-03-18 ENCOUNTER — Encounter (HOSPITAL_COMMUNITY): Payer: Self-pay | Admitting: Surgery

## 2013-03-18 ENCOUNTER — Ambulatory Visit (HOSPITAL_COMMUNITY)
Admission: RE | Admit: 2013-03-18 | Discharge: 2013-03-18 | Disposition: A | Discharge status: Home / Self Care | Payer: Medicare Other | Source: Ambulatory Visit | Attending: Surgery | Admitting: Surgery

## 2013-03-18 ENCOUNTER — Telehealth: Payer: Self-pay | Admitting: Internal Medicine

## 2013-03-18 DIAGNOSIS — I739 Peripheral vascular disease, unspecified: Secondary | ICD-10-CM | POA: Insufficient documentation

## 2013-03-18 DIAGNOSIS — I701 Atherosclerosis of renal artery: Secondary | ICD-10-CM | POA: Insufficient documentation

## 2013-03-18 DIAGNOSIS — L98499 Non-pressure chronic ulcer of skin of other sites with unspecified severity: Secondary | ICD-10-CM | POA: Insufficient documentation

## 2013-03-18 DIAGNOSIS — I10 Essential (primary) hypertension: Secondary | ICD-10-CM | POA: Insufficient documentation

## 2013-03-18 DIAGNOSIS — Z01810 Encounter for preprocedural cardiovascular examination: Secondary | ICD-10-CM

## 2013-03-18 DIAGNOSIS — J4489 Other specified chronic obstructive pulmonary disease: Secondary | ICD-10-CM | POA: Insufficient documentation

## 2013-03-18 DIAGNOSIS — L02419 Cutaneous abscess of limb, unspecified: Secondary | ICD-10-CM | POA: Insufficient documentation

## 2013-03-18 DIAGNOSIS — L97809 Non-pressure chronic ulcer of other part of unspecified lower leg with unspecified severity: Secondary | ICD-10-CM | POA: Insufficient documentation

## 2013-03-18 DIAGNOSIS — Z79899 Other long term (current) drug therapy: Secondary | ICD-10-CM | POA: Insufficient documentation

## 2013-03-18 DIAGNOSIS — J449 Chronic obstructive pulmonary disease, unspecified: Secondary | ICD-10-CM | POA: Insufficient documentation

## 2013-03-18 DIAGNOSIS — I509 Heart failure, unspecified: Secondary | ICD-10-CM | POA: Insufficient documentation

## 2013-03-18 HISTORY — PX: ABDOMINAL AORTAGRAM: SHX5454

## 2013-03-18 LAB — COMPREHENSIVE METABOLIC PANEL
ALT: 14 U/L (ref 0–53)
AST: 15 U/L (ref 0–37)
CO2: 30 mEq/L (ref 19–32)
Calcium: 9.3 mg/dL (ref 8.4–10.5)
Chloride: 97 mEq/L (ref 96–112)
Creatinine, Ser: 0.95 mg/dL (ref 0.50–1.35)
GFR calc Af Amer: >90 mL/min (ref >90)
GFR calc non Af Amer: 79 mL/min — ABNORMAL LOW (ref >90)
Glucose, Bld: 170 mg/dL — ABNORMAL HIGH (ref 70–99)
Total Bilirubin: 0.3 mg/dL (ref 0.3–1.2)

## 2013-03-18 LAB — URINALYSIS, ROUTINE W REFLEX MICROSCOPIC
Leukocytes, UA: NEGATIVE
Nitrite: NEGATIVE
Protein, ur: NEGATIVE mg/dL
Specific Gravity, Urine: <1.005 — ABNORMAL LOW (ref 1.005–1.030)
Urobilinogen, UA: 1 mg/dL (ref 0.0–1.0)

## 2013-03-18 LAB — POCT I-STAT, CHEM 8
Chloride: 99 mEq/L (ref 96–112)
Creatinine, Ser: 1.2 mg/dL (ref 0.50–1.35)
Glucose, Bld: 148 mg/dL — ABNORMAL HIGH (ref 70–99)
Hemoglobin: 10.9 g/dL — ABNORMAL LOW (ref 13.0–17.0)
Potassium: 4.2 mEq/L (ref 3.5–5.1)
Sodium: 137 mEq/L (ref 135–145)

## 2013-03-18 LAB — PROTIME-INR: INR: 1.09 (ref 0.00–1.49)

## 2013-03-18 LAB — CBC
HCT: 32.4 % — ABNORMAL LOW (ref 39.0–52.0)
MCH: 28.6 pg (ref 26.0–34.0)
MCV: 88.3 fL (ref 78.0–100.0)

## 2013-03-18 LAB — SURGICAL PCR SCREEN: MRSA, PCR: NEGATIVE

## 2013-03-18 LAB — APTT: aPTT: 29 seconds (ref 24–37)

## 2013-03-18 SURGERY — ABDOMINAL AORTAGRAM
Anesthesia: LOCAL

## 2013-03-18 MED ORDER — HYDRALAZINE HCL 20 MG/ML IJ SOLN: 10.0000 mg | INTRAMUSCULAR | Status: DC | PRN

## 2013-03-18 MED ORDER — OXYCODONE HCL 5 MG PO TABS
ORAL_TABLET | ORAL | Status: AC
  Administered 2013-03-18: 5 mg via ORAL
  Filled 2013-03-18: qty 1

## 2013-03-18 MED ORDER — ACETAMINOPHEN 325 MG PO TABS
325.0000 mg | ORAL_TABLET | ORAL | Status: DC | PRN
  Filled 2013-03-18: qty 2

## 2013-03-18 MED ORDER — SODIUM CHLORIDE 0.9 % IV SOLN
INTRAVENOUS | Status: DC
  Administered 2013-03-18: 1000 mL via INTRAVENOUS

## 2013-03-18 MED ORDER — METOPROLOL TARTRATE 1 MG/ML IV SOLN: 2.0000 mg | INTRAVENOUS | Status: DC | PRN

## 2013-03-18 MED ORDER — SODIUM CHLORIDE 0.9 % IV SOLN: 1.0000 mL/kg/h | INTRAVENOUS | Status: DC

## 2013-03-18 MED ORDER — ONDANSETRON HCL 4 MG/2ML IJ SOLN: 4.0000 mg | Freq: Four times a day (QID) | INTRAMUSCULAR | Status: DC | PRN

## 2013-03-18 MED ORDER — ACETAMINOPHEN 325 MG RE SUPP
325.0000 mg | RECTAL | Status: DC | PRN
  Filled 2013-03-18: qty 2

## 2013-03-18 MED ORDER — ALUM & MAG HYDROXIDE-SIMETH 200-200-20 MG/5ML PO SUSP: 15.0000 mL | ORAL | Status: DC | PRN

## 2013-03-18 MED ORDER — LABETALOL HCL 5 MG/ML IV SOLN: 10.0000 mg | INTRAVENOUS | Status: DC | PRN

## 2013-03-18 MED ORDER — MIDAZOLAM HCL 2 MG/2ML IJ SOLN
INTRAMUSCULAR | Status: AC
  Filled 2013-03-18: qty 2

## 2013-03-18 MED ORDER — GUAIFENESIN-DM 100-10 MG/5ML PO SYRP: 15.0000 mL | ORAL_SOLUTION | ORAL | Status: DC | PRN

## 2013-03-18 MED ORDER — FENTANYL CITRATE 0.05 MG/ML IJ SOLN
INTRAMUSCULAR | Status: AC
  Filled 2013-03-18: qty 2

## 2013-03-18 MED ORDER — LIDOCAINE HCL (PF) 1 % IJ SOLN
INTRAMUSCULAR | Status: AC
  Filled 2013-03-18: qty 30

## 2013-03-18 MED ORDER — HEPARIN (PORCINE) IN NACL 2-0.9 UNIT/ML-% IJ SOLN
INTRAMUSCULAR | Status: AC
  Filled 2013-03-18: qty 1000

## 2013-03-18 MED ORDER — PHENOL 1.4 % MT LIQD: 1.0000 | OROMUCOSAL | Status: DC | PRN

## 2013-03-18 MED ORDER — OXYCODONE HCL 5 MG PO TABS
5.0000 mg | ORAL_TABLET | ORAL | Status: DC | PRN
  Administered 2013-03-18: 5 mg via ORAL

## 2013-03-18 NOTE — Telephone Encounter (Signed)
I just saw the pt last month, and does not require another visit.  He is at significant risk for cardiopulmonary complications after any surgery, but no contraindication to the planned procedure.

## 2013-03-18 NOTE — Interval H&P Note (Signed)
History and Physical Interval Note:  03/18/2013 8:01 AM  [name]  has presented today for surgery, with the diagnosis of pvd with ulcer  The various methods of treatment have been discussed with the patient and family. After consideration of risks, benefits and other options for treatment, the patient has consented to  Procedure(s): ABDOMINAL AORTAGRAM (N/A) as a surgical intervention .  The patient's history has been reviewed, patient examined, no change in status, stable for surgery.  I have reviewed the patient's chart and labs.  Questions were answered to the patient's satisfaction.     BRABHAM IV, V. WELLS

## 2013-03-18 NOTE — Telephone Encounter (Signed)
Kendal Hymen returning call says she will cb tomorrow because she's leaving for the day.Raylene [name]

## 2013-03-18 NOTE — Op Note (Signed)
[facility] [place]  Patient name: [name] MRN: [id number] DOB: 1935/07/10 Sex: male  03/18/2013 Pre-operative Diagnosis: Right lower extremity ulcer Post-operative diagnosis:  Same Surgeon:  Jorge Ny Procedure Performed:  1.  ultrasound-guided access, left femoral artery  2.  abdominal aortogram  3.  bilateral lower extremity runoff  4.  second order catheterization     Indications:  The patient has a history of right external iliac and left common iliac stenting as well as left common femoral endarterectomy with patch angioplasty. He developed bilateral lower extremity ulcers. These were managed initially with wound care. The left leg has significantly improved, however the right leg has not. He comes in today for angiographic evaluation and possible intervention.  Procedure:  The patient was identified in the holding area and taken to room 8.  The patient was then placed supine on the table and prepped and draped in the usual sterile fashion.  A time out was called.  Ultrasound was used to evaluate the left common femoral artery.  It was patent .  A digital ultrasound image was acquired.  A micropuncture needle was used to access the left common femoral artery under ultrasound guidance.  An 018 wire was advanced without resistance and a micropuncture sheath was placed.  The 018 wire was removed and a benson wire was placed.  The micropuncture sheath was exchanged for a 5 french sheath over a Amplatz wire.  An omniflush catheter was advanced over the wire to the level of L-1.  An abdominal angiogram was obtained. Next, oblique images of the pelvic vasculature were acquired. Next, using the omniflush catheter, a Amplatz superstiff wire, and a 5 French straight catheter, the aortic bifurcation was crossed and the catheter was placed into theright external iliac artery and right runoff was obtained.  left runoff was performed via retrograde sheath  injections.  Findings:   Aortogram:  An 80% right renal artery stenosis was identified. The left renal artery appears to be widely patent. The infrarenal abdominal aorta is heavily calcified, however no significant stenosis is identified. The stent within the left common iliac artery is widely patent. Tortuosity is noted within the left external iliac artery, however and there are no identifiable stenosis. The right common and external iliac arteries are widely patent. The right external iliac artery stent is widely patent.  Right Lower Extremity:  There is an exophytic calcified plaque just distal to the stent in the proximal common femoral artery. The fundus femoral artery is widely patent. The superficial femoral artery is patent proximally however in the midportion it occludes. There is reconstitution of the popliteal artery above the knee. The below knee popliteal artery is disease free and patent. Two-vessel runoff is identified. The dominant vessel loop the peroneal artery which reconstitutes the posterior tibial artery at the ankle. The anterior tibial artery is patent but a relatively diminutive size  Left Lower Extremity:  The left common femoral artery is widely patent as is the left profunda femoral artery. There is approximately a 50% stenosis at the origin of the left superficial femoral artery. Diffuse stenosis is noted throughout the left superficial femoral artery with multiple areas of high-grade stenosis/near occlusion. The popliteal artery is widely patent and there is three-vessel runoff.  Intervention:  None  Impression:  #1  right superficial femoral artery occlusion. Do to the significant calcification and difficulty with an crossing the aortic bifurcation I feel that he is a better candidate for a right  femoral to below knee popliteal artery bypass graft.  #2  diffuse high-grade stenosis throughout the left superficial femoral artery  #3  80% right renal artery stenosis    V.  Durene Cal, M.D. [facility] [place] Office: [phone] Pager:  [phone]

## 2013-03-18 NOTE — H&P (View-Only) (Signed)
[facility] [place]   Patient name: [name] MRN: [id number] DOB: 16-Feb-1936 Sex: male     Chief Complaint  Patient presents with  . Re-evaluation    1 month f/u wound check    HISTORY OF PRESENT ILLNESS: The patient is back today for followup. On 06/28/2012 he underwent left common femoral, superficial femoral, and profunda femoral endarterectomy with background patch angioplasty. This was done in the setting of severe left leg claudication. We have discussed multiple options including femoral-popliteal bypass grafting, however because of his comorbidities I elected to proceed with femoral endarterectomy as I felt this would alleviate his symptoms. He has also undergone stenting of his left common iliac artery and right external iliac artery. He developed bilateral lower extremity ulcerations approximately 2 months ago. Because of his comorbidities I elected to exhaust noninvasive treatments to see if we could get his wounds to heal. He was referred to the wound center. He has been undergoing wound care for the past month. The wound on his left leg have essentially healed. On the right leg, however he has 2 large ulcers which have not improved. He complains of rest pain particularly when elevating his legs.   Past Medical History  Diagnosis Date  . Hypokalemia   . Colon cancer   . PVD (peripheral vascular disease)   . HTN (hypertension)   . Automatic implantable cardiac defibrillator in situ   . CHF (congestive heart failure)   . Ventricular tachycardia   . Ischemic heart disease, chronic   . Dyslipidemia   . CAD (coronary artery disease)   . COPD (chronic obstructive pulmonary disease)   . Emphysema   . On home oxygen therapy   . Stroke   . Pacemaker   . Self-catheterizes urinary bladder     since back surgery  . Difficult intubation     pt states "not aware of difficult intubation, never told"    Past Surgical History  Procedure Laterality Date   . Lumbar back surgery      x2  . Cholecystectomy    . Iliac artery stent      left  . Implantation of medtronic concerto bi-v icd  04-10-08    [name] MD  . Colonoscopy    . Polypectomy    . Insertion of iliac stent  06/04/2012    right external   . Cardiac catheterization    . Eye surgery      cataracts  . Insert / replace / remove pacemaker    . Endarterectomy femoral  06/28/2012    Procedure: ENDARTERECTOMY FEMORAL;  Surgeon: Nada Libman, MD;  Location: [facility] OR;  Service: Vascular;  Laterality: Left;  . Patch angioplasty  06/28/2012    Procedure: PATCH ANGIOPLASTY;  Surgeon: Nada Libman, MD;  Location: [facility] OR;  Service: Vascular;  Laterality: Left;  Using 0.8 cm x 15.2 cm Hemashield Patch.    History   Social History  . Marital Status: Married    Spouse Name: N/A    Number of Children: N/A  . Years of Education: N/A   Occupational History  .      Retired Company secretary   Social History Main Topics  . Smoking status: Former Smoker -- 3.00 packs/day for 48 years    Types: Cigarettes    Quit date: 07/03/2002  . Smokeless tobacco: Former Neurosurgeon    Quit date: 07/11/2002  . Alcohol Use: Yes     Comment: occasional  .  Drug Use: No  . Sexual Activity: Not on file   Other Topics Concern  . Not on file   Social History Narrative   Married, wife [name]   Oxygen-continuous   I/O self cath since back surgery    Family History  Problem Relation Age of Onset  . Heart failure Father   . Heart disease Mother     before age 45  . Hyperlipidemia Mother   . Hypertension Mother     Allergies as of 03/10/2013  . (No Known Allergies)    Current Outpatient Prescriptions on File Prior to Visit  Medication Sig Dispense Refill  . albuterol (PROAIR HFA) 108 (90 BASE) MCG/ACT inhaler Inhale 1-2 puffs into the lungs every 6 (six) hours as needed. Shortness of breath  9 Inhaler  4  . albuterol (PROVENTIL) (2.5 MG/3ML) 0.083% nebulizer solution Take  2.5 mg by nebulization 4 (four) times daily.       Marland Kitchen amLODipine (NORVASC) 5 MG tablet Take 5 mg by mouth every morning.       Marland Kitchen aspirin 325 MG tablet Take 325 mg by mouth daily.      . budesonide (PULMICORT) 0.5 MG/2ML nebulizer solution Take 0.5 mg by nebulization 2 (two) times daily.       . candesartan (ATACAND) 32 MG tablet Take 32 mg by mouth daily.        . carvedilol (COREG) 25 MG tablet Take 25 mg by mouth 2 (two) times daily with a meal.      . collagenase (SANTYL) ointment Apply 1 application topically daily.      . digoxin (LANOXIN) 0.125 MG tablet Take 125 mcg by mouth every morning.       . ferrous sulfate 325 (65 FE) MG tablet Take 325 mg by mouth at bedtime.       . furosemide (LASIX) 40 MG tablet Take 40-80 mg by mouth 2 (two) times daily. Take 2 tablets in the morning and 1 tablet in the evening      . guaiFENesin (MUCINEX) 600 MG 12 hr tablet Take 600 mg by mouth 2 (two) times daily as needed. congestion      . HYDROcodone-acetaminophen (VICODIN) 2.5-500 MG per tablet Take 1 tablet by mouth every 6 (six) hours as needed. Back pain      . nitroGLYCERIN (NITRODUR - DOSED IN MG/24 HR) 0.2 mg/hr Place 1 patch onto the skin daily.       . polyethylene glycol (MIRALAX / GLYCOLAX) packet Take 17 g by mouth as needed.      . potassium chloride SA (K-DUR,KLOR-CON) 20 MEQ tablet Take 1 tablet (20 mEq total) by mouth daily.  90 tablet  3  . predniSONE (DELTASONE) 10 MG tablet Take 1 tablet (10 mg total) by mouth daily.  90 tablet  4  . predniSONE (DELTASONE) 10 MG tablet TAKE 1 TABLET BY MOUTH ONCE DAILY  30 tablet  3  . simvastatin (ZOCOR) 20 MG tablet Take 20 mg by mouth at bedtime.        Marland Kitchen tiotropium (SPIRIVA) 18 MCG inhalation capsule Place 18 mcg into inhaler and inhale daily.        No current facility-administered medications on file prior to visit.     REVIEW OF SYSTEMS: Please see history of present illness, otherwise no changes from prior visit  PHYSICAL EXAMINATION:    Vital signs are BP 142/69  Pulse 76  Temp(Src) 98 F (36.7 C) (Oral)  Ht  5\' 7"  (1.702 m)  Wt 183 lb (83.008 kg)  BMI 28.66 kg/m2  SpO2 96% General: The patient appears their stated age. HEENT:  No gross abnormalities Pulmonary:  Non labored breathing Abdomen: Soft and non-tender Musculoskeletal: There are no major deformities. Neurologic: No focal weakness or paresthesias are detected, Skin: 2, approximately 2 cm ulcers on the right lateral leg with surrounding erythema Psychiatric: The patient has normal affect. Cardiovascular: There is a regular rate and rhythm without significant murmur appreciated. Pedal pulses are not palpable   Diagnostic Studies None  Assessment: Bilateral lower extremity ulceration, right greater than left Plan: The patient has had good progress on healing of his left leg ulcers. Unfortunately the right leg has not shown significant improvement. In addition, I feel that he has a superficial cellulitis on the right leg. I have given him a prescription for Levaquin to help with the cellulitis. I feel he needs to undergo  revascularization of the right leg for a chance at limb salvage. I have reviewed his angiogram from December 2013. He has a right superficial femoral occlusion. I feel that it will be difficult to revascularize him for percutaneous perspective, however I feel that I need to try before recommending femoral-popliteal bypass grafting. This procedure has been scheduled for next Tuesday, September 16. I did discuss the risks of the procedure which include distal embolization, need for emergent surgery, and risk of limb loss. He is currently on 325 aspirin which I will continue. Jorge Ny, M.D. [facility] [place] Office: [phone] Pager:  [phone]

## 2013-03-18 NOTE — Telephone Encounter (Signed)
PT is scheduled for Right femoral popliteal bypass graft on 03/28/13.  They would like pt seen for surgical clearance prior to that date.  No appts available.  Please advise on appt.

## 2013-03-18 NOTE — Telephone Encounter (Signed)
New problem     From office notes in  02/04/13 . Can patient be clear for surgery  - right femoral - bypass graft.

## 2013-03-18 NOTE — Telephone Encounter (Signed)
Lm for Bonnie to call back

## 2013-03-18 NOTE — Progress Notes (Signed)
03/18/13 1343  OBSTRUCTIVE SLEEP APNEA  Have you ever been diagnosed with sleep apnea through a sleep study? No  Do you snore loudly (loud enough to be heard through closed doors)?  1  Do you often feel tired, fatigued, or sleepy during the daytime? 0  Has anyone observed you stop breathing during your sleep? 0  Do you have, or are you being treated for high blood pressure? 1  BMI more than 35 kg/m2? 0  Age over 75 years old? 1  Neck circumference greater than 40 cm/18 inches? 0 (17.5)  Gender: 1  Obstructive Sleep Apnea Score 4

## 2013-03-18 NOTE — Discharge Instructions (Signed)
Groin Site Care Refer to this sheet in the next few weeks. These instructions provide you with information on caring for yourself after your procedure. Your caregiver may also give you more specific instructions. Your treatment has been planned according to current medical practices, but problems sometimes occur. Call your caregiver if you have any problems or questions after your procedure. HOME CARE INSTRUCTIONS  You may shower 24 hours after the procedure. Remove the bandage (dressing) and gently wash the site with plain soap and water. Gently pat the site dry.  Do not apply powder or lotion to the site.  Do not sit in a bathtub, swimming pool, or whirlpool for 5 to 7 days.  No bending, squatting, or lifting anything over 10 pounds (4.5 kg) as directed by your caregiver.  Inspect the site at least twice daily.  Do not drive home if you are discharged the same day of the procedure. Have someone else drive you.  You may drive 24 hours after the procedure unless otherwise instructed by your caregiver. What to expect:  Any bruising will usually fade within 1 to 2 weeks.  Blood that collects in the tissue (hematoma) may be painful to the touch. It should usually decrease in size and tenderness within 1 to 2 weeks. SEEK IMMEDIATE MEDICAL CARE IF:  You have unusual pain at the groin site or down the affected leg.  You have redness, warmth, swelling, or pain at the groin site.  You have drainage (other than a small amount of blood on the dressing).  You have chills.  You have a fever or persistent symptoms for more than 72 hours.  You have a fever and your symptoms suddenly get worse.  Your leg becomes pale, cool, tingly, or numb.  You have heavy bleeding from the site. Hold pressure on the site. Document Released: 07/22/2010 Document Revised: 09/11/2011 Document Reviewed: 07/22/2010 Exit[employer] Patient Information 2014 [employer].  

## 2013-03-18 NOTE — Telephone Encounter (Signed)
[email or web address] Castillo, CMA  

## 2013-03-18 NOTE — Pre-Procedure Instructions (Signed)
CONNIE HILGERT  03/18/2013   Your procedure is scheduled on:  Friday, September 26th.  Report to [facility], Main Entrance / Entrance "A" at 5:30 AM.  Call this number if you have problems the morning of surgery: [phone]   Remember:   Do not eat food or drink liquids after midnight.   Take these medicines the morning of surgery with A SIP OF WATER: Amlodipine (Norvasc), Carvediolol (Coreg), Digoxin (Lanoxin).  Use inhalers and bring Albuterol inhaler to the hospital with you.  Take if needed:  Nitroglycerin, Hydrocodone- Acetaminophen, Guaifenesin (Mucinex).   Do not wear jewelry, make-up or nail polish.  Do not wear lotions, powders, or perfumes. You may wear deodorant.   Men may shave face and neck.  Do not bring valuables to the hospital.  [facility] is not responsible for any belongings or valuables.  Contacts, dentures or bridgework may not be worn into surgery.  Leave suitcase in the car. After surgery it may be brought to your room.  For patients admitted to the hospital, checkout time is 11:00 AM the day of discharge.     Special Instructions: Shower using CHG 2 nights before surgery and the night before surgery.  If you shower the day of surgery use CHG.  Use special wash - you have one bottle of CHG for all showers.  You should use approximately 1/3 of the bottle for each shower.   Please read over the following fact sheets that you were given: Pain Booklet, Coughing and Deep Breathing, Blood Transfusion Information and Surgical Site Infection Prevention

## 2013-03-19 ENCOUNTER — Encounter (HOSPITAL_COMMUNITY): Payer: Self-pay | Admitting: *Deleted

## 2013-03-19 ENCOUNTER — Other Ambulatory Visit: Payer: Self-pay | Admitting: *Deleted

## 2013-03-19 NOTE — Telephone Encounter (Signed)
Please advise if msg can be closed, thank you! 

## 2013-03-19 NOTE — Telephone Encounter (Signed)
I spoke with Kendal Hymen and is aware. I will also forward this message to her box.

## 2013-03-20 ENCOUNTER — Encounter (INDEPENDENT_AMBULATORY_CARE_PROVIDER_SITE_OTHER): Payer: Medicare Other | Admitting: *Deleted

## 2013-03-20 DIAGNOSIS — I739 Peripheral vascular disease, unspecified: Secondary | ICD-10-CM

## 2013-03-20 DIAGNOSIS — Z01810 Encounter for preprocedural cardiovascular examination: Secondary | ICD-10-CM

## 2013-03-20 NOTE — Progress Notes (Addendum)
Anesthesia chart review:  Patient is a 77 year old male scheduled for right FPBG on 03/28/13 by Dr. Myra Bradley.    History includes former smoker, HTN, CAD s/p CABG '98, ischemic cardiomyopathy with EF < 20% (now 40-45% by 12/2011 echo) chronic systolic CHF, VT s/p Medtr[employer] BiV ICD '09, PAF, PAD s/p left femoral endarterectomy 06/2012, COPD/emphysema with home O2 use, DM2, neurogenic bladder, anemia (bone marrow biopsy 12/24/12 without evidence of lymphoproliferative disorder), CVA, colon cancer s/p left hemicolectomy 03/09/2004, L4-5, L5-S1 PLIF 12/14/2006. PCP is Dr. Sharlyn Bradley. Hematologist is Dr. Truett Bradley, last visit 02/06/13. Pulmonologist Dr. Shelle Bradley, last visit 02/12/13.  Dr. Shelle Bradley was notified of plans for surgery and stated on 03/18/13, "He is at significant risk for cardiopulmonary complications after any surgery, but no contraindication to the planned procedure."  Difficult intubation was already listed in his anesthesia history, however patient denied this. His anesthesia record from 12/14/2006 is on his chart for review. A Blade 2 Miller and 8.0 ETT was used on 12/14/06 with one attempt.  Miller 2 with stylet and 7.5 ETT was used 06/28/12.    Primary Cardiologist is Dr. Verdis Bradley, last visit was 12/26/12. EP Cardiologist is Dr. Ladona Bradley, last visit 02/04/13. [name] was contacted for cardiac clearance, but response is pending.  He was thought to be high > 5% risk for complications prior to his femoral endarterectomy 06/2012.  EKG on 10/01/12 showed AV sequential pacing.   Echo on 12/17/11 showed:  - Left ventricle: The cavity size was mildly dilated. There was moderate concentric hypertrophy. Systolic function was mildly to moderately reduced. The estimated ejection fraction was in the range of 40% to 45%. Wall motion was normal; there were no regional wall motion abnormalities. - Ventricular septum: Septal motion showed mild dyssynergy. These changes are consistent with right ventricular  pacing. - Mitral valve: Mild regurgitation. - Left atrium: The atrium was moderately to severely dilated. - Pulmonary arteries: Systolic pressure was mildly to moderately increased. PA peak pressure: 41mm Hg (S).  By Dr. Michaelle Bradley note, "Last echo in 12/2011 suggested an EF 40-45% which seems to be higher than actual (30-35%)."  He has not had a recent stress test or cardiac cath.   CXR on 03/18/13 showed COPD without acute abnormality.  Preoperative labs noted.  I'll follow-up later for additional cardiology input.    [name]/Anesthesiology Phone [phone] 03/20/2013 5:59 PM  Addendum: 03/21/2013 12:20 PM Note from Dr. Ladona Bradley in Epic from earlier today states, "He is moderate to high risk for cardiovascular complications from surgery. I would suggest pulmonary input as I am not sure he is stable enough from a pulmonary standpoint to be able to undergo intubation and extubation as he has oxygen dependent COPD. There is nothing that we can do to [name] to lower his cardiac risk preop."  Pulmonology input was already obtained.  If no acute changes or new/acute cardiopulmonary symptomology then I would anticipate he could proceed as planned with increased cardiac and pulmonary risk as outline above.

## 2013-03-21 ENCOUNTER — Encounter (HOSPITAL_COMMUNITY): Payer: Self-pay | Admitting: Pharmacy Technician

## 2013-03-21 NOTE — Telephone Encounter (Signed)
Pam,  I know [name] well. He is moderate to high risk for cardiovascular complications from surgery. I would suggest pulmonary input as I am not sure he is stable enough from a pulmonary standpoint to be able to undergo intubation and extubation as he has oxygen dependent COPD. There is nothing that we can do to [name] to lower his cardiac risk preop. Leonia Reeves.D.

## 2013-03-21 NOTE — Telephone Encounter (Signed)
Spoke with [facility] - aware I will fax Dr Lubertha Basque note to them

## 2013-03-21 NOTE — Telephone Encounter (Signed)
Follow up    [name] from [facility] calling back to speak with nurse regarding cardiac clearance .

## 2013-03-21 NOTE — Telephone Encounter (Signed)
Pt is scheduled for surgery 9/26 and needs cardiac clearance ASAP.  Bonnie aware I will forward this to Dr [name] for review and recommendations.

## 2013-03-27 MED ORDER — SODIUM CHLORIDE 0.9 % IV SOLN: INTRAVENOUS | Status: DC

## 2013-03-27 MED ORDER — DEXTROSE 5 % IV SOLN
1.5000 g | INTRAVENOUS | Status: AC
  Administered 2013-03-28: 1.5 g via INTRAVENOUS
  Filled 2013-03-27: qty 1.5

## 2013-03-28 ENCOUNTER — Encounter (HOSPITAL_COMMUNITY): Payer: Self-pay | Admitting: *Deleted

## 2013-03-28 ENCOUNTER — Inpatient Hospital Stay (HOSPITAL_COMMUNITY)
Admission: RE | Admit: 2013-03-28 | Discharge: 2013-04-02 | DRG: 238 | Disposition: A | Discharge status: Home Health | Payer: Medicare Other | Source: Ambulatory Visit | Attending: Surgery | Admitting: Surgery

## 2013-03-28 ENCOUNTER — Encounter (HOSPITAL_COMMUNITY): Payer: Self-pay | Admitting: Vascular Surgery

## 2013-03-28 ENCOUNTER — Inpatient Hospital Stay (HOSPITAL_COMMUNITY): Payer: Medicare Other | Admitting: Vascular Surgery

## 2013-03-28 ENCOUNTER — Encounter (HOSPITAL_COMMUNITY)
Admission: RE | Disposition: A | Discharge status: Home Health | Payer: Self-pay | Source: Ambulatory Visit | Attending: Surgery

## 2013-03-28 DIAGNOSIS — Z87891 Personal history of nicotine dependence: Secondary | ICD-10-CM

## 2013-03-28 DIAGNOSIS — I509 Heart failure, unspecified: Secondary | ICD-10-CM | POA: Diagnosis present

## 2013-03-28 DIAGNOSIS — IMO0002 Reserved for concepts with insufficient information to code with codable children: Secondary | ICD-10-CM

## 2013-03-28 DIAGNOSIS — M79609 Pain in unspecified limb: Secondary | ICD-10-CM

## 2013-03-28 DIAGNOSIS — Z79899 Other long term (current) drug therapy: Secondary | ICD-10-CM

## 2013-03-28 DIAGNOSIS — L98499 Non-pressure chronic ulcer of skin of other sites with unspecified severity: Secondary | ICD-10-CM

## 2013-03-28 DIAGNOSIS — I5022 Chronic systolic (congestive) heart failure: Secondary | ICD-10-CM | POA: Diagnosis present

## 2013-03-28 DIAGNOSIS — N189 Chronic kidney disease, unspecified: Secondary | ICD-10-CM | POA: Diagnosis present

## 2013-03-28 DIAGNOSIS — Z23 Encounter for immunization: Secondary | ICD-10-CM

## 2013-03-28 DIAGNOSIS — L97909 Non-pressure chronic ulcer of unspecified part of unspecified lower leg with unspecified severity: Secondary | ICD-10-CM | POA: Diagnosis present

## 2013-03-28 DIAGNOSIS — E876 Hypokalemia: Secondary | ICD-10-CM | POA: Diagnosis present

## 2013-03-28 DIAGNOSIS — I7092 Chronic total occlusion of artery of the extremities: Secondary | ICD-10-CM | POA: Diagnosis present

## 2013-03-28 DIAGNOSIS — Z981 Arthrodesis status: Secondary | ICD-10-CM

## 2013-03-28 DIAGNOSIS — J438 Other emphysema: Secondary | ICD-10-CM | POA: Diagnosis present

## 2013-03-28 DIAGNOSIS — I129 Hypertensive chronic kidney disease with stage 1 through stage 4 chronic kidney disease, or unspecified chronic kidney disease: Secondary | ICD-10-CM | POA: Diagnosis present

## 2013-03-28 DIAGNOSIS — Z95810 Presence of automatic (implantable) cardiac defibrillator: Secondary | ICD-10-CM

## 2013-03-28 DIAGNOSIS — Z9981 Dependence on supplemental oxygen: Secondary | ICD-10-CM

## 2013-03-28 DIAGNOSIS — I739 Peripheral vascular disease, unspecified: Principal | ICD-10-CM | POA: Diagnosis present

## 2013-03-28 DIAGNOSIS — L02419 Cutaneous abscess of limb, unspecified: Secondary | ICD-10-CM | POA: Diagnosis present

## 2013-03-28 DIAGNOSIS — N319 Neuromuscular dysfunction of bladder, unspecified: Secondary | ICD-10-CM | POA: Diagnosis present

## 2013-03-28 DIAGNOSIS — Z951 Presence of aortocoronary bypass graft: Secondary | ICD-10-CM

## 2013-03-28 DIAGNOSIS — Z7982 Long term (current) use of aspirin: Secondary | ICD-10-CM

## 2013-03-28 DIAGNOSIS — N179 Acute kidney failure, unspecified: Secondary | ICD-10-CM | POA: Diagnosis not present

## 2013-03-28 DIAGNOSIS — I2589 Other forms of chronic ischemic heart disease: Secondary | ICD-10-CM | POA: Diagnosis present

## 2013-03-28 DIAGNOSIS — Z8673 Personal history of transient ischemic attack (TIA), and cerebral infarction without residual deficits: Secondary | ICD-10-CM

## 2013-03-28 DIAGNOSIS — D62 Acute posthemorrhagic anemia: Secondary | ICD-10-CM | POA: Diagnosis not present

## 2013-03-28 DIAGNOSIS — I951 Orthostatic hypotension: Secondary | ICD-10-CM | POA: Diagnosis present

## 2013-03-28 DIAGNOSIS — I2510 Atherosclerotic heart disease of native coronary artery without angina pectoris: Secondary | ICD-10-CM | POA: Diagnosis present

## 2013-03-28 DIAGNOSIS — Z85038 Personal history of other malignant neoplasm of large intestine: Secondary | ICD-10-CM

## 2013-03-28 HISTORY — DX: Anemia, unspecified: D64.9

## 2013-03-28 HISTORY — PX: FEMORAL-POPLITEAL BYPASS GRAFT: SHX937

## 2013-03-28 HISTORY — DX: Neurogenic bowel, not elsewhere classified: K59.2

## 2013-03-28 HISTORY — DX: Neuromuscular dysfunction of bladder, unspecified: N31.9

## 2013-03-28 LAB — GLUCOSE, CAPILLARY: Glucose-Capillary: 126 mg/dL — ABNORMAL HIGH (ref 70–99)

## 2013-03-28 LAB — CREATININE, SERUM
Creatinine, Ser: 0.97 mg/dL (ref 0.50–1.35)
GFR calc Af Amer: >90 mL/min (ref >90)
GFR calc non Af Amer: 79 mL/min — ABNORMAL LOW (ref >90)

## 2013-03-28 LAB — CBC
HCT: 28.3 % — ABNORMAL LOW (ref 39.0–52.0)
Platelets: 176 10*3/uL (ref 150–400)
RDW: 15.5 % (ref 11.5–15.5)
WBC: 10.5 10*3/uL (ref 4.0–10.5)

## 2013-03-28 SURGERY — BYPASS GRAFT FEMORAL-POPLITEAL ARTERY
Anesthesia: General | Site: Leg Upper | Laterality: Right | Wound class: Clean

## 2013-03-28 MED ORDER — ALBUTEROL SULFATE HFA 108 (90 BASE) MCG/ACT IN AERS
INHALATION_SPRAY | RESPIRATORY_TRACT | Status: DC | PRN
  Administered 2013-03-28: 4 via RESPIRATORY_TRACT

## 2013-03-28 MED ORDER — ALBUTEROL SULFATE (5 MG/ML) 0.5% IN NEBU
2.5000 mg | INHALATION_SOLUTION | Freq: Four times a day (QID) | RESPIRATORY_TRACT | Status: DC
  Administered 2013-03-28 – 2013-04-02 (×17): 2.5 mg via RESPIRATORY_TRACT
  Filled 2013-03-28 (×18): qty 0.5

## 2013-03-28 MED ORDER — DOPAMINE-DEXTROSE 3.2-5 MG/ML-% IV SOLN: 3.0000 ug/kg/min | INTRAVENOUS | Status: DC

## 2013-03-28 MED ORDER — ACETAMINOPHEN 325 MG PO TABS: 325.0000 mg | ORAL_TABLET | ORAL | Status: DC | PRN

## 2013-03-28 MED ORDER — MORPHINE SULFATE 2 MG/ML IJ SOLN
INTRAMUSCULAR | Status: AC
  Filled 2013-03-28: qty 1

## 2013-03-28 MED ORDER — HEPARIN SODIUM (PORCINE) 1000 UNIT/ML IJ SOLN
INTRAMUSCULAR | Status: DC | PRN
  Administered 2013-03-28: 8000 [IU] via INTRAVENOUS
  Administered 2013-03-28: 2000 [IU] via INTRAVENOUS

## 2013-03-28 MED ORDER — 0.9 % SODIUM CHLORIDE (POUR BTL) OPTIME
TOPICAL | Status: DC | PRN
  Administered 2013-03-28: 1000 mL
  Administered 2013-03-28: 2000 mL

## 2013-03-28 MED ORDER — CARVEDILOL 25 MG PO TABS
25.0000 mg | ORAL_TABLET | Freq: Two times a day (BID) | ORAL | Status: DC
  Administered 2013-03-28 – 2013-04-02 (×10): 25 mg via ORAL
  Filled 2013-03-28 (×12): qty 1

## 2013-03-28 MED ORDER — PANTOPRAZOLE SODIUM 40 MG PO TBEC
40.0000 mg | DELAYED_RELEASE_TABLET | Freq: Every day | ORAL | Status: DC
  Administered 2013-03-28 – 2013-04-02 (×6): 40 mg via ORAL
  Filled 2013-03-28 (×6): qty 1

## 2013-03-28 MED ORDER — ALBUTEROL SULFATE HFA 108 (90 BASE) MCG/ACT IN AERS
2.0000 | INHALATION_SPRAY | RESPIRATORY_TRACT | Status: DC | PRN
  Administered 2013-03-31 (×2): 2 via RESPIRATORY_TRACT
  Filled 2013-03-28: qty 6.7

## 2013-03-28 MED ORDER — OXYCODONE HCL 5 MG/5ML PO SOLN: 5.0000 mg | Freq: Once | ORAL | Status: DC | PRN

## 2013-03-28 MED ORDER — METOPROLOL TARTRATE 1 MG/ML IV SOLN: 2.0000 mg | INTRAVENOUS | Status: DC | PRN

## 2013-03-28 MED ORDER — COLLAGENASE 250 UNIT/GM EX OINT
1.0000 "application " | TOPICAL_OINTMENT | Freq: Every day | CUTANEOUS | Status: DC
  Administered 2013-03-29 – 2013-04-02 (×5): 1 via TOPICAL
  Filled 2013-03-28: qty 30

## 2013-03-28 MED ORDER — MORPHINE SULFATE 2 MG/ML IJ SOLN
2.0000 mg | INTRAMUSCULAR | Status: DC | PRN
  Administered 2013-03-28: 4 mg via INTRAVENOUS
  Administered 2013-03-28 (×2): 2 mg via INTRAVENOUS
  Administered 2013-03-28 – 2013-04-01 (×13): 4 mg via INTRAVENOUS
  Filled 2013-03-28 (×10): qty 2
  Filled 2013-03-28: qty 1
  Filled 2013-03-28 (×4): qty 2

## 2013-03-28 MED ORDER — THROMBIN 20000 UNITS EX SOLR
CUTANEOUS | Status: AC
  Filled 2013-03-28: qty 20000

## 2013-03-28 MED ORDER — GLYCOPYRROLATE 0.2 MG/ML IJ SOLN
INTRAMUSCULAR | Status: DC | PRN
  Administered 2013-03-28: 0.6 mg via INTRAVENOUS

## 2013-03-28 MED ORDER — DIGOXIN 125 MCG PO TABS
125.0000 ug | ORAL_TABLET | Freq: Every morning | ORAL | Status: DC
  Administered 2013-03-29 – 2013-04-02 (×5): 125 ug via ORAL
  Filled 2013-03-28 (×5): qty 1

## 2013-03-28 MED ORDER — MIDAZOLAM HCL 5 MG/5ML IJ SOLN
INTRAMUSCULAR | Status: DC | PRN
  Administered 2013-03-28: 0.5 mg via INTRAVENOUS

## 2013-03-28 MED ORDER — DOCUSATE SODIUM 100 MG PO CAPS
100.0000 mg | ORAL_CAPSULE | Freq: Every day | ORAL | Status: DC
  Administered 2013-03-29 – 2013-04-02 (×5): 100 mg via ORAL
  Filled 2013-03-28 (×5): qty 1

## 2013-03-28 MED ORDER — HYDRALAZINE HCL 20 MG/ML IJ SOLN: 10.0000 mg | INTRAMUSCULAR | Status: DC | PRN

## 2013-03-28 MED ORDER — FUROSEMIDE 40 MG PO TABS: 40.0000 mg | ORAL_TABLET | Freq: Two times a day (BID) | ORAL | Status: DC

## 2013-03-28 MED ORDER — OXYCODONE HCL 5 MG PO TABS: 5.0000 mg | ORAL_TABLET | Freq: Once | ORAL | Status: DC | PRN

## 2013-03-28 MED ORDER — HYDROCODONE-ACETAMINOPHEN 5-325 MG PO TABS
0.5000 | ORAL_TABLET | ORAL | Status: DC | PRN
  Administered 2013-03-29 – 2013-04-02 (×9): 1 via ORAL
  Filled 2013-03-28 (×9): qty 1

## 2013-03-28 MED ORDER — LACTATED RINGERS IV SOLN
INTRAVENOUS | Status: DC | PRN
  Administered 2013-03-28: 07:00:00 via INTRAVENOUS

## 2013-03-28 MED ORDER — AMLODIPINE BESYLATE 5 MG PO TABS
5.0000 mg | ORAL_TABLET | Freq: Every morning | ORAL | Status: DC
  Administered 2013-03-30 – 2013-04-02 (×4): 5 mg via ORAL
  Filled 2013-03-28 (×5): qty 1

## 2013-03-28 MED ORDER — FUROSEMIDE 40 MG PO TABS
40.0000 mg | ORAL_TABLET | Freq: Every evening | ORAL | Status: DC
  Administered 2013-03-28 – 2013-04-01 (×5): 40 mg via ORAL
  Filled 2013-03-28 (×6): qty 1

## 2013-03-28 MED ORDER — FUROSEMIDE 80 MG PO TABS
80.0000 mg | ORAL_TABLET | Freq: Every day | ORAL | Status: DC
  Administered 2013-03-29: 80 mg via ORAL
  Filled 2013-03-28 (×3): qty 1

## 2013-03-28 MED ORDER — LABETALOL HCL 5 MG/ML IV SOLN
10.0000 mg | INTRAVENOUS | Status: DC | PRN
  Filled 2013-03-28: qty 4

## 2013-03-28 MED ORDER — ONDANSETRON HCL 4 MG/2ML IJ SOLN
INTRAMUSCULAR | Status: DC | PRN
  Administered 2013-03-28: 4 mg via INTRAVENOUS

## 2013-03-28 MED ORDER — NITROGLYCERIN 0.2 MG/HR TD PT24
0.2000 mg | MEDICATED_PATCH | Freq: Every day | TRANSDERMAL | Status: DC
  Administered 2013-03-28: 0.2 mg via TRANSDERMAL
  Filled 2013-03-28 (×2): qty 1

## 2013-03-28 MED ORDER — FENTANYL CITRATE 0.05 MG/ML IJ SOLN
INTRAMUSCULAR | Status: DC | PRN
  Administered 2013-03-28: 50 ug via INTRAVENOUS
  Administered 2013-03-28: 125 ug via INTRAVENOUS
  Administered 2013-03-28: 25 ug via INTRAVENOUS

## 2013-03-28 MED ORDER — FERROUS SULFATE 325 (65 FE) MG PO TABS
325.0000 mg | ORAL_TABLET | Freq: Every day | ORAL | Status: DC
  Administered 2013-03-28 – 2013-04-01 (×5): 325 mg via ORAL
  Filled 2013-03-28 (×7): qty 1

## 2013-03-28 MED ORDER — ACETAMINOPHEN 650 MG RE SUPP: 325.0000 mg | RECTAL | Status: DC | PRN

## 2013-03-28 MED ORDER — SIMVASTATIN 20 MG PO TABS
20.0000 mg | ORAL_TABLET | Freq: Every day | ORAL | Status: DC
  Administered 2013-03-28 – 2013-04-01 (×5): 20 mg via ORAL
  Filled 2013-03-28 (×7): qty 1

## 2013-03-28 MED ORDER — NITROGLYCERIN 0.4 MG SL SUBL: 0.4000 mg | SUBLINGUAL_TABLET | SUBLINGUAL | Status: DC | PRN

## 2013-03-28 MED ORDER — INFLUENZA VAC SPLIT QUAD 0.5 ML IM SUSP
0.5000 mL | INTRAMUSCULAR | Status: AC
  Administered 2013-03-29: 0.5 mL via INTRAMUSCULAR
  Filled 2013-03-28: qty 0.5

## 2013-03-28 MED ORDER — PROPOFOL 10 MG/ML IV BOLUS
INTRAVENOUS | Status: DC | PRN
  Administered 2013-03-28: 130 mg via INTRAVENOUS
  Administered 2013-03-28: 20 mg via INTRAVENOUS

## 2013-03-28 MED ORDER — GUAIFENESIN ER 600 MG PO TB12
600.0000 mg | ORAL_TABLET | Freq: Two times a day (BID) | ORAL | Status: DC | PRN
  Administered 2013-03-31: 600 mg via ORAL
  Filled 2013-03-28: qty 1

## 2013-03-28 MED ORDER — IRBESARTAN 150 MG PO TABS
150.0000 mg | ORAL_TABLET | Freq: Every day | ORAL | Status: DC
  Administered 2013-03-30 – 2013-03-31 (×2): 150 mg via ORAL
  Filled 2013-03-28 (×4): qty 1

## 2013-03-28 MED ORDER — LACTATED RINGERS IV SOLN
INTRAVENOUS | Status: DC
  Administered 2013-03-28: 07:00:00 via INTRAVENOUS

## 2013-03-28 MED ORDER — PROTAMINE SULFATE 10 MG/ML IV SOLN
INTRAVENOUS | Status: DC | PRN
  Administered 2013-03-28: 10 mg via INTRAVENOUS
  Administered 2013-03-28: 15 mg via INTRAVENOUS
  Administered 2013-03-28: 10 mg via INTRAVENOUS
  Administered 2013-03-28: 15 mg via INTRAVENOUS

## 2013-03-28 MED ORDER — POTASSIUM CHLORIDE CRYS ER 20 MEQ PO TBCR
20.0000 meq | EXTENDED_RELEASE_TABLET | Freq: Every day | ORAL | Status: DC
  Administered 2013-03-29 – 2013-04-02 (×5): 20 meq via ORAL
  Filled 2013-03-28 (×5): qty 1

## 2013-03-28 MED ORDER — PREDNISONE 10 MG PO TABS
10.0000 mg | ORAL_TABLET | Freq: Every day | ORAL | Status: DC
  Administered 2013-03-29 – 2013-04-02 (×5): 10 mg via ORAL
  Filled 2013-03-28 (×5): qty 1

## 2013-03-28 MED ORDER — PHENOL 1.4 % MT LIQD: 1.0000 | OROMUCOSAL | Status: DC | PRN

## 2013-03-28 MED ORDER — SODIUM CHLORIDE 0.9 % IV SOLN
INTRAVENOUS | Status: DC
  Administered 2013-03-28: 75 mL/h via INTRAVENOUS
  Administered 2013-03-29: 15:00:00 via INTRAVENOUS

## 2013-03-28 MED ORDER — ZOLPIDEM TARTRATE 5 MG PO TABS: 5.0000 mg | ORAL_TABLET | Freq: Every evening | ORAL | Status: DC | PRN

## 2013-03-28 MED ORDER — ASPIRIN 325 MG PO TABS
325.0000 mg | ORAL_TABLET | Freq: Every day | ORAL | Status: DC
  Administered 2013-03-29 – 2013-04-02 (×5): 325 mg via ORAL
  Filled 2013-03-28 (×5): qty 1

## 2013-03-28 MED ORDER — HEMOSTATIC AGENTS (NO CHARGE) OPTIME
TOPICAL | Status: DC | PRN
  Administered 2013-03-28: 1 via TOPICAL

## 2013-03-28 MED ORDER — ONDANSETRON HCL 4 MG/2ML IJ SOLN: 4.0000 mg | Freq: Four times a day (QID) | INTRAMUSCULAR | Status: DC | PRN

## 2013-03-28 MED ORDER — LIDOCAINE HCL (CARDIAC) 20 MG/ML IV SOLN
INTRAVENOUS | Status: DC | PRN
  Administered 2013-03-28: 70 mg via INTRAVENOUS

## 2013-03-28 MED ORDER — SODIUM CHLORIDE 0.9 % IV SOLN: 500.0000 mL | Freq: Once | INTRAVENOUS | Status: AC | PRN

## 2013-03-28 MED ORDER — THROMBIN 20000 UNITS EX KIT
PACK | CUTANEOUS | Status: AC
  Filled 2013-03-28: qty 1

## 2013-03-28 MED ORDER — HYDROMORPHONE HCL PF 1 MG/ML IJ SOLN: 0.2500 mg | INTRAMUSCULAR | Status: DC | PRN

## 2013-03-28 MED ORDER — NEOSTIGMINE METHYLSULFATE 1 MG/ML IJ SOLN
INTRAMUSCULAR | Status: DC | PRN
  Administered 2013-03-28: 4 mg via INTRAVENOUS

## 2013-03-28 MED ORDER — ALUM & MAG HYDROXIDE-SIMETH 200-200-20 MG/5ML PO SUSP: 15.0000 mL | ORAL | Status: DC | PRN

## 2013-03-28 MED ORDER — SODIUM CHLORIDE 0.9 % IR SOLN
Status: DC | PRN
  Administered 2013-03-28: 08:00:00

## 2013-03-28 MED ORDER — ONDANSETRON HCL 4 MG/2ML IJ SOLN
4.0000 mg | Freq: Four times a day (QID) | INTRAMUSCULAR | Status: DC | PRN
  Administered 2013-03-28 – 2013-03-29 (×2): 4 mg via INTRAVENOUS
  Filled 2013-03-28 (×2): qty 2

## 2013-03-28 MED ORDER — POTASSIUM CHLORIDE CRYS ER 20 MEQ PO TBCR: 20.0000 meq | EXTENDED_RELEASE_TABLET | Freq: Once | ORAL | Status: AC | PRN

## 2013-03-28 MED ORDER — CEFUROXIME SODIUM 1.5 G IJ SOLR
1.5000 g | Freq: Two times a day (BID) | INTRAMUSCULAR | Status: AC
  Administered 2013-03-28 – 2013-03-29 (×2): 1.5 g via INTRAVENOUS
  Filled 2013-03-28 (×2): qty 1.5

## 2013-03-28 MED ORDER — TIOTROPIUM BROMIDE MONOHYDRATE 18 MCG IN CAPS
18.0000 ug | ORAL_CAPSULE | Freq: Every day | RESPIRATORY_TRACT | Status: DC
  Administered 2013-03-29 – 2013-04-02 (×4): 18 ug via RESPIRATORY_TRACT
  Filled 2013-03-28 (×2): qty 5

## 2013-03-28 MED ORDER — BUDESONIDE 0.5 MG/2ML IN SUSP
0.5000 mg | Freq: Two times a day (BID) | RESPIRATORY_TRACT | Status: DC
  Administered 2013-03-28 – 2013-04-02 (×9): 0.5 mg via RESPIRATORY_TRACT
  Filled 2013-03-28 (×12): qty 2

## 2013-03-28 MED ORDER — DEXTROSE 5 % IV SOLN
INTRAVENOUS | Status: DC | PRN
  Administered 2013-03-28: 08:00:00 via INTRAVENOUS

## 2013-03-28 MED ORDER — ROCURONIUM BROMIDE 100 MG/10ML IV SOLN
INTRAVENOUS | Status: DC | PRN
  Administered 2013-03-28: 50 mg via INTRAVENOUS

## 2013-03-28 MED ORDER — ENOXAPARIN SODIUM 30 MG/0.3ML ~~LOC~~ SOLN
30.0000 mg | SUBCUTANEOUS | Status: DC
  Administered 2013-03-29 – 2013-04-01 (×4): 30 mg via SUBCUTANEOUS
  Filled 2013-03-28 (×5): qty 0.3

## 2013-03-28 SURGICAL SUPPLY — 74 items
ADH SKN CLS APL DERMABOND .7 (GAUZE/BANDAGES/DRESSINGS) ×2
BANDAGE ELASTIC 4 VELCRO ST LF (GAUZE/BANDAGES/DRESSINGS) IMPLANT
BANDAGE ESMARK 6X9 LF (GAUZE/BANDAGES/DRESSINGS) IMPLANT
BNDG CMPR 9X6 STRL LF SNTH (GAUZE/BANDAGES/DRESSINGS)
BNDG ESMARK 6X9 LF (GAUZE/BANDAGES/DRESSINGS)
CANISTER SUCTION 2500CC (MISCELLANEOUS) ×2 IMPLANT
CATH EMB 5FR 80CM (CATHETERS) ×1 IMPLANT
CLIP TI MEDIUM 24 (CLIP) ×2 IMPLANT
CLIP TI WIDE RED SMALL 24 (CLIP) ×2 IMPLANT
COVER SURGICAL LIGHT HANDLE (MISCELLANEOUS) ×2 IMPLANT
CUFF TOURNIQUET SINGLE 24IN (TOURNIQUET CUFF) IMPLANT
CUFF TOURNIQUET SINGLE 34IN LL (TOURNIQUET CUFF) IMPLANT
CUFF TOURNIQUET SINGLE 44IN (TOURNIQUET CUFF) IMPLANT
DERMABOND ADVANCED (GAUZE/BANDAGES/DRESSINGS) ×2
DERMABOND ADVANCED .7 DNX12 (GAUZE/BANDAGES/DRESSINGS) ×1 IMPLANT
DRAIN CHANNEL 15F RND FF W/TCR (WOUND CARE) IMPLANT
DRAPE WARM FLUID 44X44 (DRAPE) ×2 IMPLANT
DRAPE X-RAY CASS 24X20 (DRAPES) IMPLANT
DRSG COVADERM 4X10 (GAUZE/BANDAGES/DRESSINGS) IMPLANT
DRSG COVADERM 4X8 (GAUZE/BANDAGES/DRESSINGS) IMPLANT
ELECT REM PT RETURN 9FT ADLT (ELECTROSURGICAL) ×2
ELECTRODE REM PT RTRN 9FT ADLT (ELECTROSURGICAL) ×1 IMPLANT
EVACUATOR SILICONE 100CC (DRAIN) IMPLANT
FELT TEFLON 6X6 (MISCELLANEOUS) ×1 IMPLANT
GLOVE BIO SURGEON STRL SZ 6.5 (GLOVE) ×3 IMPLANT
GLOVE BIOGEL PI IND STRL 6 (GLOVE) IMPLANT
GLOVE BIOGEL PI IND STRL 6.5 (GLOVE) IMPLANT
GLOVE BIOGEL PI IND STRL 7.0 (GLOVE) IMPLANT
GLOVE BIOGEL PI IND STRL 7.5 (GLOVE) ×1 IMPLANT
GLOVE BIOGEL PI INDICATOR 6 (GLOVE) ×1
GLOVE BIOGEL PI INDICATOR 6.5 (GLOVE) ×6
GLOVE BIOGEL PI INDICATOR 7.0 (GLOVE) ×2
GLOVE BIOGEL PI INDICATOR 7.5 (GLOVE) ×1
GLOVE ECLIPSE 6.5 STRL STRAW (GLOVE) ×4 IMPLANT
GLOVE SURG SS PI 7.5 STRL IVOR (GLOVE) ×2 IMPLANT
GOWN PREVENTION PLUS XXLARGE (GOWN DISPOSABLE) ×2 IMPLANT
GOWN STRL NON-REIN LRG LVL3 (GOWN DISPOSABLE) ×9 IMPLANT
GOWN STRL REIN 2XL XLG LVL4 (GOWN DISPOSABLE) ×1 IMPLANT
GRAFT PROPATEN W/RING 6X80X60 (Vascular Products) ×1 IMPLANT
HEMOSTAT SNOW SURGICEL 2X4 (HEMOSTASIS) ×1 IMPLANT
KIT BASIN OR (CUSTOM PROCEDURE TRAY) ×2 IMPLANT
KIT ROOM TURNOVER OR (KITS) ×2 IMPLANT
MARKER GRAFT CORONARY BYPASS (MISCELLANEOUS) IMPLANT
NS IRRIG 1000ML POUR BTL (IV SOLUTION) ×5 IMPLANT
PACK PERIPHERAL VASCULAR (CUSTOM PROCEDURE TRAY) ×2 IMPLANT
PAD ARMBOARD 7.5X6 YLW CONV (MISCELLANEOUS) ×4 IMPLANT
PADDING CAST COTTON 6X4 STRL (CAST SUPPLIES) IMPLANT
PENCIL BUTTON HOLSTER BLD 10FT (ELECTRODE) ×1 IMPLANT
SET COLLECT BLD 21X3/4 12 (NEEDLE) IMPLANT
SPONGE LAP 18X18 X RAY DECT (DISPOSABLE) ×1 IMPLANT
STAPLER VISISTAT 35W (STAPLE) IMPLANT
STOPCOCK 4 WAY LG BORE MALE ST (IV SETS) ×1 IMPLANT
SUT ETHILON 3 0 PS 1 (SUTURE) IMPLANT
SUT GORETEX 6.0 TT13 (SUTURE) ×2 IMPLANT
SUT GORETEX 6.0 TT9 (SUTURE) ×1 IMPLANT
SUT PROLENE 5 0 C 1 24 (SUTURE) ×2 IMPLANT
SUT PROLENE 6 0 BV (SUTURE) ×10 IMPLANT
SUT PROLENE 7 0 BV 1 (SUTURE) IMPLANT
SUT PROLENE 7 0 BV1 MDA (SUTURE) ×1 IMPLANT
SUT SILK 2 0 SH (SUTURE) ×2 IMPLANT
SUT SILK 3 0 (SUTURE)
SUT SILK 3-0 18XBRD TIE 12 (SUTURE) IMPLANT
SUT VIC AB 2-0 CT1 27 (SUTURE) ×4
SUT VIC AB 2-0 CT1 TAPERPNT 27 (SUTURE) ×2 IMPLANT
SUT VIC AB 3-0 SH 27 (SUTURE) ×6
SUT VIC AB 3-0 SH 27X BRD (SUTURE) ×2 IMPLANT
SUT VICRYL 4-0 PS2 18IN ABS (SUTURE) ×4 IMPLANT
SYR 30ML SLIP (SYRINGE) ×1 IMPLANT
TOWEL OR 17X24 6PK STRL BLUE (TOWEL DISPOSABLE) ×4 IMPLANT
TOWEL OR 17X26 10 PK STRL BLUE (TOWEL DISPOSABLE) ×4 IMPLANT
TRAY FOLEY CATH 16FRSI W/METER (SET/KITS/TRAYS/PACK) ×2 IMPLANT
TUBING EXTENTION W/L.L. (IV SETS) IMPLANT
UNDERPAD 30X30 INCONTINENT (UNDERPADS AND DIAPERS) ×2 IMPLANT
WATER STERILE IRR 1000ML POUR (IV SOLUTION) ×2 IMPLANT

## 2013-03-28 NOTE — Progress Notes (Signed)
Utilization review completed.  

## 2013-03-28 NOTE — Progress Notes (Signed)
Up to side of bed, became nauseated but no vomiting. Patient took a few steps to the. Patient brought his own inhaler and took two puffs.

## 2013-03-28 NOTE — Anesthesia Procedure Notes (Signed)
Procedure Name: Intubation Date/Time: 03/28/2013 7:52 AM Performed by: Marni Griffon Pre-anesthesia Checklist: Patient identified, Emergency Drugs available, Suction available and Patient being monitored Patient Re-evaluated:Patient Re-evaluated prior to inductionOxygen Delivery Method: Circle system utilized Preoxygenation: Pre-oxygenation with 100% oxygen Intubation Type: IV induction Ventilation: Oral airway inserted - appropriate to patient size Laryngoscope Size: Mac and 4 Grade View: Grade II Tube type: Oral Tube size: 7.5 mm Number of attempts: 1 Airway Equipment and Method: Stylet Placement Confirmation: ETT inserted through vocal cords under direct vision,  positive ETCO2 and breath sounds checked- equal and bilateral Secured at: 21 (cm at upper gum) cm Tube secured with: Tape Dental Injury: Teeth and Oropharynx as per pre-operative assessment

## 2013-03-28 NOTE — Anesthesia Postprocedure Evaluation (Signed)
Anesthesia Post Note  Patient: [name]  Procedure(s) Performed: Procedure(s) (LRB): BYPASS GRAFT FEMORAL-BELOW KNEE POPLITEAL ARTERY Using 6mm x 80 cm Propaten Graft ,With Right Common Femoral Artery Endarterectomy. (Right)  Anesthesia type: General  Patient location: PACU  Post pain: Pain level controlled and Adequate analgesia  Post assessment: Post-op Vital signs reviewed, Patient's Cardiovascular Status Stable, Respiratory Function Stable, Patent Airway and Pain level controlled  Last Vitals:  Filed Vitals:   03/28/13 1225  BP:   Pulse:   Temp: 36.8 C  Resp:     Post vital signs: Reviewed and stable  Level of consciousness: awake, alert  and oriented  Complications: No apparent anesthesia complications

## 2013-03-28 NOTE — Op Note (Signed)
[facility]  Patient name: [name] MRN: [id number] DOB: [birth year] Sex: male  03/28/2013 Pre-operative Diagnosis: Right lower extremity ulcer Post-operative diagnosis:  Same Surgeon:  Jorge Ny Assistants:  Doreatha Massed Procedure:   Right common femoral to below knee popliteal artery bypass graft with 6 mm propatent PTFE, and extensive right common femoral endarterectomy and the distal external iliac artery endarterectomy Anesthesia:  Gen. Blood Loss:  See anesthesia record Specimens:  Right femoral plaque  Findings:  Extensive nearly occlusive plaque throughout the entire common femoral artery extending up into the distal external iliac artery and both profunda and superficial femoral artery. The distal target was the below knee popliteal artery. This also was very calcified.  Indications:  The patient is multiple comorbidities. He is undergone left femoral endarterectomy for severe claudication. He developed bilateral ulcers. He was able to heal the 1 the left however he was not able to heal the one on the right. Angiography revealed an occluded right superficial femoral artery. He comes in today for bypass for limb salvage. Although the patient's vein appears to be adequate, I have discussed with the family possibly using Gore-Tex so as to get him out of the operating room as quick as possible given his comorbidities. He does not have claudication symptoms on the right or left  Procedure:  The patient was identified in the holding area and taken to [facility] OR ROOM 16  The patient was then placed supine on the table. general anesthesia was administered.  The patient was prepped and draped in the usual sterile fashion.  A time out was called and antibiotics were administered.  A longitudinal incision was made in the right groin. Cautery was used to divide subcutaneous tissue down to the femoral sheath which was opened sharply. The common femoral artery  was then exposed from the inguinal ligament down to the bifurcation. This artery was heavily Circumferentially calcified. Once it was fully exposed and side branches appropriately controlled, a medial below-knee incision was made. I did visualize his saphenous vein and protected it. The fascia was then divided with cautery. Gastrocnemius muscle was reflected posteriorly. A portion of the soleus muscle was detached from the posterior surface of the tibia. I extensively mobilize the femoral vein, dividing the anterior tibial vein branches. This gave excellent exposure to the below-knee popliteal artery. I then used a long tunneler to create a subsartorial tunnel. The patient was fully heparinized. I elected not to use vein as I did not want to take the operative time to harvest this vein because of the extensive endarterectomy that was going to be required in the groin. After the heparin circulated a Hanley clamp was placed proximally and baby Gregory clamps were used to the profunda and superficial femoral arteries. Am 11 blade was used to make an arteriotomy. Potts scissors were used to open the arteriotomy. I had difficulty finding the lumen because of the extensive plaque. I did perform endarterectomy of the proximal superficial femoral and profunda femoral arteries as well as the common femoral artery. The plaque was nearly 100% occlusive in the common femoral artery. I then passed a #5 Fogarty catheter into the distal external iliac artery selective visualize the distal external iliac artery as he did have a plaque up under the inguinal ligament. This plaque was successfully removed. All potential embolic debris was removed. There was a very thin, torn area in the common femoral artery after endarterectomy, and therefore this was closed by  reapproximating the edges with running 6-0 Prolene. Next a 6 mm propatent PTFE graft was selected. I beveled the foot of the graft so as to create a patch was common femoral  artery. The total length of the patch was approximately 3 cm. A running end-to-side anastomosis was created with CV 6 Gore-Tex suture. Multiple pledgeted sutures were used for hemostasis. Once hemostasis was achieved, the graft was occluded. Was brought through the previously created tunnel. The popliteal artery was then occluded proximally and distally proximally it was a Hanley clamp, distally was a Silastic loop. A #11 blade was used to make an arteriotomy which was extended longitudinally with Potts scissors. The graft was cut to the appropriate length and bevel to fit the size of the arteriotomy. I anastomosis was created with CV 6 Gore-Tex suture. Prior to completion, the appropriate flushing maneuvers were performed. The anastomosis was then completed. I got a faint posterior tibial artery signal that was graft dependent. The patient had brisk Doppler flow it was graft dependent in the proximal peroneal artery which is a single vessel runoff. I elected not to shoot an arteriogram. 50 mg of protamine was administered. After hemostasis was achieved, the fascia in the below knee incision was closed with 2-0 Vicryl. Subcutaneous tissue was closed 2-0 Vicryl the skin was closed with 4-0 Vicryl. Similarly in the groin, the femoral sheath was reapproximated with 2-0 Vicryl. The subcutaneous tissue was closed with a layer of 20 at layer of 3-0 Vicryl. Dermabond was applied to the skin. There were no complications.  Disposition:  To PACU in stable condition.   Juleen China, M.D. [facility] [place] [facility] Office: [phone] Pager:  [phone]

## 2013-03-28 NOTE — Progress Notes (Signed)
Patient received to room 3s4 via stretcher from PACU. Vitals obtained and stable. Lower extremities are cool to touch, pulses are doppler.  Rt. groin incision is at level 0 and is clean dry and intact. Patient has been settled and oriented to the unit. Wife at bedside and have instructed both on not getting out of bed and call for the nurse using the call bell.

## 2013-03-28 NOTE — H&P (Signed)
[facility] [place]     Patient name: [name]           MRN: [id number]        DOB: 1935/09/07          Sex: male       Chief Complaint   Patient presents with   .  Re-evaluation       1 month f/u wound check        HISTORY OF PRESENT ILLNESS: The patient is back today for followup. On 06/28/2012 he underwent left common femoral, superficial femoral, and profunda femoral endarterectomy with background patch angioplasty. This was done in the setting of severe left leg claudication. We have discussed multiple options including femoral-popliteal bypass grafting, however because of his comorbidities I elected to proceed with femoral endarterectomy as I felt this would alleviate his symptoms. He has also undergone stenting of his left common iliac artery and right external iliac artery. He developed bilateral lower extremity ulcerations approximately 2 months ago. Because of his comorbidities I elected to exhaust noninvasive treatments to see if we could get his wounds to heal. He was referred to the wound center. He has been undergoing wound care for the past month. The wound on his left leg have essentially healed. On the right leg, however he has 2 large ulcers which have not improved. He complains of rest pain particularly when elevating his legs.      Past Medical History   Diagnosis  Date   .  Hypokalemia     .  Colon cancer     .  PVD (peripheral vascular disease)     .  HTN (hypertension)     .  Automatic implantable cardiac defibrillator in situ     .  CHF (congestive heart failure)     .  Ventricular tachycardia     .  Ischemic heart disease, chronic     .  Dyslipidemia     .  CAD (coronary artery disease)     .  COPD (chronic obstructive pulmonary disease)     .  Emphysema     .  On home oxygen therapy     .  Stroke     .  Pacemaker     .  Self-catheterizes urinary bladder         since back surgery   .  Difficult intubation          pt states "not aware of difficult intubation, never told"         Past Surgical History   Procedure  Laterality  Date   .  Lumbar back surgery           x2   .  Cholecystectomy       .  Iliac artery stent           left   .  Implantation of medtronic concerto bi-v icd    04-10-08       Robert Bunting MD   .  Colonoscopy       .  Polypectomy       .  Insertion of iliac stent    06/04/2012       right external    .  Cardiac catheterization       .  Eye surgery           cataracts   .  Insert /  replace / remove pacemaker       .  Endarterectomy femoral    06/28/2012       Procedure: ENDARTERECTOMY FEMORAL;  Surgeon: Robert Libman, MD;  Location: [facility] OR;  Service: Vascular;  Laterality: Left;   .  Patch angioplasty    06/28/2012       Procedure: PATCH ANGIOPLASTY;  Surgeon: Robert Libman, MD;  Location: [facility] OR;  Service: Vascular;  Laterality: Left;  Using 0.8 cm x 15.2 cm Hem[place]ield Patch.         History       Social History   .  Marital Status:  Married       Spouse Name:  N/A       Number of Children:  N/A   .  Years of Education:  N/A       Occupational History   .           Retired Company secretary       Social History Main Topics   .  Smoking status:  Former Smoker -- 3.00 packs/day for 48 years       Types:  Cigarettes       Quit date:  07/03/2002   .  Smokeless tobacco:  Former Neurosurgeon       Quit date:  07/11/2002   .  Alcohol Use:  Yes         Comment: occasional   .  Drug Use:  No   .  Sexual Activity:  Not on file       Other Topics  Concern   .  Not on file       Social History Narrative     Married, wife [name]     Oxygen-continuous     I/O self cath since back surgery         Family History   Problem  Relation  Age of Onset   .  Heart failure  Father     .  Heart disease  Mother         before age 9   .  Hyperlipidemia  Mother     .  Hypertension  Mother           Allergies as of 03/10/2013   .  (No Known  Allergies)         Current Outpatient Prescriptions on File Prior to Visit   Medication  Sig  Dispense  Refill   .  albuterol (PROAIR HFA) 108 (90 BASE) MCG/ACT inhaler  Inhale 1-2 puffs into the lungs every 6 (six) hours as needed. Shortness of breath   9 Inhaler   4   .  albuterol (PROVENTIL) (2.5 MG/3ML) 0.083% nebulizer solution  Take 2.5 mg by nebulization 4 (four) times daily.          Marland Kitchen  amLODipine (NORVASC) 5 MG tablet  Take 5 mg by mouth every morning.          Marland Kitchen  aspirin 325 MG tablet  Take 325 mg by mouth daily.         .  budesonide (PULMICORT) 0.5 MG/2ML nebulizer solution  Take 0.5 mg by nebulization 2 (two) times daily.          .  candesartan (ATACAND) 32 MG tablet  Take 32 mg by mouth daily.           .  carvedilol (COREG) 25 MG tablet  Take  25 mg by mouth 2 (two) times daily with a meal.         .  collagenase (SANTYL) ointment  Apply 1 application topically daily.         .  digoxin (LANOXIN) 0.125 MG tablet  Take 125 mcg by mouth every morning.          .  ferrous sulfate 325 (65 FE) MG tablet  Take 325 mg by mouth at bedtime.          .  furosemide (LASIX) 40 MG tablet  Take 40-80 mg by mouth 2 (two) times daily. Take 2 tablets in the morning and 1 tablet in the evening         .  guaiFENesin (MUCINEX) 600 MG 12 hr tablet  Take 600 mg by mouth 2 (two) times daily as needed. congestion         .  HYDROcodone-acetaminophen (VICODIN) 2.5-500 MG per tablet  Take 1 tablet by mouth every 6 (six) hours as needed. Back pain         .  nitroGLYCERIN (NITRODUR - DOSED IN MG/24 HR) 0.2 mg/hr  Place 1 patch onto the skin daily.          .  polyethylene glycol (MIRALAX / GLYCOLAX) packet  Take 17 g by mouth as needed.         .  potassium chloride SA (K-DUR,KLOR-CON) 20 MEQ tablet  Take 1 tablet (20 mEq total) by mouth daily.   90 tablet   3   .  predniSONE (DELTASONE) 10 MG tablet  Take 1 tablet (10 mg total) by mouth daily.   90 tablet   4   .  predniSONE (DELTASONE) 10 MG tablet   TAKE 1 TABLET BY MOUTH ONCE DAILY   30 tablet   3   .  simvastatin (ZOCOR) 20 MG tablet  Take 20 mg by mouth at bedtime.           Marland Kitchen  tiotropium (SPIRIVA) 18 MCG inhalation capsule  Place 18 mcg into inhaler and inhale daily.              No current facility-administered medications on file prior to visit.          REVIEW OF SYSTEMS: Please see history of present illness, otherwise no changes from prior visit   PHYSICAL EXAMINATION:   Vital signs are BP 142/69  Pulse 76  Temp(Src) 98 F (36.7 C) (Oral)  Ht 5\' 7"  (1.702 m)  Wt 183 lb (83.008 kg)  BMI 28.66 kg/m2  SpO2 96% General: The patient appears their stated age. HEENT:  No gross abnormalities Pulmonary:  Non labored breathing Abdomen: Soft and non-tender Musculoskeletal: There are no major deformities. Neurologic: No focal weakness or paresthesias are detected, Skin: 2, approximately 2 cm ulcers on the right lateral leg with surrounding erythema Psychiatric: The patient has normal affect. Cardiovascular: There is a regular rate and rhythm without significant murmur appreciated. Pedal pulses are not palpable     Diagnostic Studies None   Assessment: Bilateral lower extremity ulceration, right greater than left Plan: The patient has had good progress on healing of his left leg ulcers. Unfortunately the right leg has not shown significant improvement. In addition, I feel that he has a superficial cellulitis on the right leg. I have given him a prescription for Levaquin to help with the cellulitis. I feel he needs to undergo  revascularization of the right leg for a chance at  limb salvage. I have reviewed his angiogram from December 2013. He has a right superficial femoral occlusion. I feel that it will be difficult to revascularize him for percutaneous perspective, however I feel that I need to try before recommending femoral-popliteal bypass grafting. This procedure has been scheduled for next Tuesday, September 16.  I did discuss the risks of the procedure which include distal embolization, need for emergent surgery, and risk of limb loss. He is currently on 325 aspirin which I will continue. Jorge Ny, M.D.  Angio showed an ocluded right SFA with recon of BK Pop.  He needs surgical revascularization for limb salvage.  D/w pt and wife   [facility] [place] Office: [phone] Pager:  [phone]

## 2013-03-28 NOTE — Anesthesia Preprocedure Evaluation (Addendum)
Anesthesia Evaluation  Patient identified by MRN, date of birth, ID band Patient awake    Reviewed: Allergy & Precautions, H&P , NPO status , Patient's Chart, lab work & pertinent test results, reviewed documented beta blocker date and time   Airway Mallampati: III  Neck ROM: full    Dental  (+) Edentulous Upper, Teeth Intact and Dental Advisory Given   Pulmonary shortness of breath, with exertion and Long-Term Oxygen Therapy, COPD COPD inhaler and oxygen dependent, former smoker,          Cardiovascular hypertension, Pt. on home beta blockers + CAD, + CABG, + Peripheral Vascular Disease and +CHF + dysrhythmias Atrial Fibrillation and Ventricular Tachycardia + pacemaker + Cardiac Defibrillator     Neuro/Psych TIA   GI/Hepatic   Endo/Other  diabetes, Well Controlled, Type 2  Renal/GU      Musculoskeletal   Abdominal   Peds  Hematology   Anesthesia Other Findings   Reproductive/Obstetrics                          Anesthesia Physical Anesthesia Plan  ASA: IV  Anesthesia Plan: General   Post-op Pain Management:    Induction: Intravenous  Airway Management Planned: Oral ETT  Additional Equipment:   Intra-op Plan:   Post-operative Plan: Extubation in OR and Possible Post-op intubation/ventilation  Informed Consent: I have reviewed the patients History and Physical, chart, labs and discussed the procedure including the risks, benefits and alternatives for the proposed anesthesia with the patient or authorized representative who has indicated his/her understanding and acceptance.     Plan Discussed with: CRNA, Anesthesiologist and Surgeon  Anesthesia Plan Comments:        Anesthesia Quick Evaluation

## 2013-03-28 NOTE — Preoperative (Signed)
Beta Blockers   Reason not to administer Beta Blockers:Coreg 0400 today

## 2013-03-28 NOTE — Transfer of Care (Signed)
Immediate Anesthesia Transfer of Care Note  Patient: [name]  Procedure(s) Performed: Procedure(s): BYPASS GRAFT FEMORAL-BELOW KNEE POPLITEAL ARTERY Using 6mm x 80 cm Propaten Graft ,With Right Common Femoral Artery Endarterectomy. (Right)  Patient Location: PACU  Anesthesia Type:General  Level of Consciousness: awake, alert , oriented and patient cooperative  Airway & Oxygen Therapy: Patient Spontanous Breathing and Patient connected to nasal cannula oxygen  Post-op Assessment: Report given to PACU RN and Post -op Vital signs reviewed and stable  Post vital signs: Reviewed and stable  Complications: No apparent anesthesia complications

## 2013-03-29 LAB — BASIC METABOLIC PANEL
Calcium: 8.4 mg/dL (ref 8.4–10.5)
Chloride: 100 mEq/L (ref 96–112)
GFR calc Af Amer: 66 mL/min — ABNORMAL LOW (ref >90)
GFR calc non Af Amer: 57 mL/min — ABNORMAL LOW (ref >90)
Potassium: 4.1 mEq/L (ref 3.5–5.1)
Sodium: 138 mEq/L (ref 135–145)

## 2013-03-29 LAB — CBC
Hemoglobin: 8.8 g/dL — ABNORMAL LOW (ref 13.0–17.0)
MCH: 28.5 pg (ref 26.0–34.0)
MCHC: 32.4 g/dL (ref 30.0–36.0)
RDW: 15.5 % (ref 11.5–15.5)
WBC: 13.7 10*3/uL — ABNORMAL HIGH (ref 4.0–10.5)

## 2013-03-29 MED ORDER — SODIUM CHLORIDE 0.9 % IV BOLUS (SEPSIS): 500.0000 mL | Freq: Once | INTRAVENOUS | Status: DC

## 2013-03-29 MED ORDER — NITROGLYCERIN 0.2 MG/HR TD PT24
0.2000 mg | MEDICATED_PATCH | Freq: Every day | TRANSDERMAL | Status: DC
  Administered 2013-03-29 – 2013-04-01 (×4): 0.2 mg via TRANSDERMAL
  Filled 2013-03-29 (×5): qty 1

## 2013-03-29 MED ORDER — SODIUM CHLORIDE 0.9 % IV BOLUS (SEPSIS)
500.0000 mL | Freq: Once | INTRAVENOUS | Status: AC
  Administered 2013-03-29: 500 mL via INTRAVENOUS

## 2013-03-29 MED ORDER — NITROGLYCERIN 0.2 MG/HR TD PT24: 0.2000 mg | MEDICATED_PATCH | Freq: Every day | TRANSDERMAL | Status: DC

## 2013-03-29 NOTE — Progress Notes (Signed)
Pt's BP down to 92/52 left arm and 83/36 right arm.  Pt states he feels "a little funny".  Repositioned pt with feet elevated and head to 45 degrees in chair.  BP now 116/44.  Della Goo, [place] notified.  Orders received for bolus and to hold BP meds this am.  Will continue to monitor.    Roselie Awkward, RN

## 2013-03-29 NOTE — Progress Notes (Signed)
[facility] [place]  Daily Progress Note  Assessment/Planning: POD #1 s/p R iliofem EA, R CFA to BK pop BPG w/ Propaten   Ok to transfer to floor  D/C foley  IS x 10 q1  PT/OT: suspect he will have needed  Wound c/s pending  Check H/H tomorrow: if he drifts further down, transfusion with diuresis might be needed due to CAD history  Subjective  - 1 Day Post-Op  No events overnight, pain ok  Objective Filed Vitals:   03/28/13 2335 03/29/13 0000 03/29/13 0400 03/29/13 0800  BP: 121/41  121/78 125/36  Pulse: 85  85 89  Temp:  98.9 F (37.2 C) 99.5 F (37.5 C) 97.7 F (36.5 C)  TempSrc:  Oral Oral Oral  Resp: 17  16 16   Height:      Weight:      SpO2: 93%  95% 91%    Intake/Output Summary (Last 24 hours) at 03/29/13 0832 Last data filed at 03/29/13 0805  Gross per 24 hour  Intake   3015 ml  Output   1150 ml  Net   1865 ml    PULM  BLL rales CV  RRR GI  soft, NTND VASC  R groin and calf incision c/d/i, strongly dopplerable PT  Laboratory CBC    Component Value Date/Time   WBC 13.7* 03/29/2013 0108   WBC 9.1 02/06/2013 1127   HGB 8.8* 03/29/2013 0108   HGB 9.8* 02/06/2013 1127   HCT 27.2* 03/29/2013 0108   HCT 31.3* 02/06/2013 1127   PLT 195 03/29/2013 0108   PLT 178 02/06/2013 1127    BMET    Component Value Date/Time   NA 138 03/29/2013 0108   NA 139 02/06/2013 1127   K 4.1 03/29/2013 0108   K 4.3 02/06/2013 1127   CL 100 03/29/2013 0108   CO2 29 03/29/2013 0108   CO2 31* 02/06/2013 1127   GLUCOSE 133* 03/29/2013 0108   GLUCOSE 152* 02/06/2013 1127   BUN 18 03/29/2013 0108   BUN 24.1 02/06/2013 1127   CREATININE 1.20 03/29/2013 0108   CREATININE 1.0 02/06/2013 1127   CALCIUM 8.4 03/29/2013 0108   CALCIUM 9.4 02/06/2013 1127   GFRNONAA 57* 03/29/2013 0108   GFRAA 66* 03/29/2013 0108    Leonides Sake, MD [facility] [place] Office: [phone] Pager: [phone]  03/29/2013, 8:32 AM

## 2013-03-29 NOTE — Evaluation (Signed)
Physical Therapy Evaluation Patient Details Name: [name] MRN: [id number] DOB: 10-25-1935 Today's Date: 03/29/2013 Time: 7829-5621 PT Time Calculation (min): 26 min  PT Assessment / Plan / Recommendation History of Present Illness  Pt underwent fem pop bypass graft 03-28-13.  Prior to this procedure he was walking daily for exercise.  He uses continuous O2 at home.  Clinical Impression  Patient is s/p fem pop bypass graft surgery resulting in functional limitations due to the deficits listed below (see PT Problem List).  Patient will benefit from skilled PT to increase their independence and safety with mobility to allow discharge to the venue listed below. Pt's wife is able to provide 24 hour supervision/assist at home.      PT Assessment  Patient needs continued PT services    Follow Up Recommendations  Home health PT;Supervision/Assistance - 24 hour    Does the patient have the potential to tolerate intense rehabilitation      Barriers to Discharge        Equipment Recommendations  None recommended by PT    Recommendations for Other Services     Frequency Min 3X/week    Precautions / Restrictions Precautions Precautions: None   Pertinent Vitals/Pain 5/10      Mobility  Transfers Transfers: Sit to Stand;Stand to Sit Sit to Stand: 4: Min assist;From chair/3-in-1;With armrests Stand to Sit: To chair/3-in-1;4: Min assist;With armrests Details for Transfer Assistance: verbal cues for hand placement, safety Ambulation/Gait Ambulation/Gait Assistance: 4: Min guard Ambulation Distance (Feet): 150 Feet Assistive device: Rolling walker Ambulation/Gait Assistance Details: continuous O2 2L/min via [place] during ambulation, +1 assist for chair follow Gait Pattern: Decreased stride length;Trunk flexed;Step-through pattern Gait velocity: decreased    Exercises     PT Diagnosis: Difficulty walking;Generalized weakness;Acute pain  PT Problem List: Decreased  strength;Decreased activity tolerance;Decreased mobility;Cardiopulmonary status limiting activity;Pain PT Treatment Interventions: DME instruction;Gait training;Stair training;Functional mobility training;Therapeutic activities;Therapeutic exercise;Patient/family education     PT Goals(Current goals can be found in the care plan section) Acute Rehab PT Goals Patient Stated Goal: home PT Goal Formulation: With patient Time For Goal Achievement: 04/05/13 Potential to Achieve Goals: Good  Visit Information  Last PT Received On: 03/29/13 Assistance Needed: +1 History of Present Illness: Pt underwent fem pop bypass graft 03-28-13.  Prior to this procedure he was walking daily for exercise.  He uses continuous O2 at home.       Prior Functioning  Home Living Family/patient expects to be discharged to:: Private residence Living Arrangements: Spouse/significant other Available Help at Discharge: Family;Available 24 hours/day Type of Home: House Home Access: Stairs to enter [employer] of Steps: 5 Entrance Stairs-Rails: Right;Left;Can reach both Home Layout: Two level;Able to live on main level with bedroom/bathroom Home Equipment: Gilmer Mor - single point;Walker - 2 wheels;Wheelchair - manual;Shower seat;Grab bars - toilet;Grab bars - tub/shower Prior Function Level of Independence: Independent with assistive device(s) Communication Communication: No difficulties Dominant Hand: Right    Cognition  Cognition Arousal/Alertness: Awake/alert Behavior During Therapy: WFL for tasks assessed/performed Overall Cognitive Status: Within Functional Limits for tasks assessed    Extremity/Trunk Assessment     Balance    End of Session PT - End of Session Equipment Utilized During Treatment: Gait belt Activity Tolerance: Patient tolerated treatment well Patient left: in chair;with call bell/phone within reach;with family/visitor present Nurse Communication: Mobility status  GP      Ilda Foil 03/29/2013, 3:34 PM  Aida Raider, PT  Office # [phone] Pager [phone]

## 2013-03-29 NOTE — Progress Notes (Signed)
Notified Della Goo, [place] of orthostatic VS.  Also notified her of UOP (125cc at 0800, 75cc at 1130).  No new orders received at this time.  Will continue to monitor.  Roselie Awkward, RN

## 2013-03-30 LAB — CBC
HCT: 25.8 % — ABNORMAL LOW (ref 39.0–52.0)
Hemoglobin: 8.3 g/dL — ABNORMAL LOW (ref 13.0–17.0)
MCH: 28.7 pg (ref 26.0–34.0)
MCV: 89.3 fL (ref 78.0–100.0)
Platelets: 158 10*3/uL (ref 150–400)
RBC: 2.89 MIL/uL — ABNORMAL LOW (ref 4.22–5.81)
WBC: 10.5 10*3/uL (ref 4.0–10.5)

## 2013-03-30 LAB — BASIC METABOLIC PANEL
BUN: 31 mg/dL — ABNORMAL HIGH (ref 6–23)
CO2: 28 mEq/L (ref 19–32)
Calcium: 8.1 mg/dL — ABNORMAL LOW (ref 8.4–10.5)
Creatinine, Ser: 1.97 mg/dL — ABNORMAL HIGH (ref 0.50–1.35)
Glucose, Bld: 130 mg/dL — ABNORMAL HIGH (ref 70–99)

## 2013-03-30 NOTE — Progress Notes (Signed)
Physical Therapy Treatment Patient Details Name: [name] MRN: [id number] DOB: 08/31/1935 Today's Date: 03/30/2013 Time: 5621-3086 PT Time Calculation (min): 24 min  PT Assessment / Plan / Recommendation  History of Present Illness     PT Comments   Decreased gait distance today due to pain.  May need to consider CIR/SNF if no significant increase in mobility/gait in 1-2 days.  Follow Up Recommendations  Home health PT;Supervision/Assistance - 24 hour     Does the patient have the potential to tolerate intense rehabilitation     Barriers to Discharge        Equipment Recommendations  None recommended by PT    Recommendations for Other Services    Frequency Min 3X/week   Progress towards PT Goals Progress towards PT goals: Progressing toward goals  Plan Current plan remains appropriate    Precautions / Restrictions     Pertinent Vitals/Pain 10/10 (RN notified)    Mobility  Transfers Sit to Stand: 4: Min assist;From chair/3-in-1;With upper extremity assist;With armrests Stand to Sit: 4: Min assist;To chair/3-in-1;With armrests;With upper extremity assist Details for Transfer Assistance: verbal cues for hand placement, safety, posture Ambulation/Gait Ambulation/Gait Assistance: 4: Min guard Ambulation Distance (Feet): 80 Feet Assistive device: Rolling walker Ambulation/Gait Assistance Details: continuous O2 2 L/min during ambulation, +1 assist for chair follow Gait Pattern: Antalgic;Trunk flexed Gait velocity: decreased    Exercises     PT Diagnosis:    PT Problem List:   PT Treatment Interventions:     PT Goals (current goals can now be found in the care plan section)    Visit Information  Last PT Received On: 03/30/13 Assistance Needed: +2 (for safety)    Subjective Data      Cognition  Cognition Arousal/Alertness: Awake/alert Behavior During Therapy: WFL for tasks assessed/performed Overall Cognitive Status: Within Functional Limits for tasks  assessed    Balance     End of Session PT - End of Session Equipment Utilized During Treatment: Gait belt Activity Tolerance: Patient limited by pain Patient left: in chair;with call bell/phone within reach;with nursing/sitter in room Nurse Communication: Mobility status;Patient requests pain meds   GP     Ilda Foil 03/30/2013, 12:03 PM  Aida Raider, PT  Office # [phone] Pager [phone]

## 2013-03-30 NOTE — Progress Notes (Addendum)
  VASCULAR SURGERY BYPASS PROGRESS NOTE   2 Days Post-Op Right common femoral to below knee popliteal artery bypass graft with 6 mm propatent PTFE, and extensive right common femoral endarterectomy and the distal external iliac artery endarterectomy   SUBJECTIVE: pt doing well. He states he ambulated yesterday without dizziness. SBP has been stable. No orthostatic changes.  PHYSICAL EXAM: BP Readings from Last 3 Encounters:  03/30/13 110/42  03/30/13 110/42  03/18/13 140/60   Temp Readings from Last 3 Encounters:  03/30/13 97.7 F (36.5 C) Oral  03/30/13 97.7 F (36.5 C) Oral  03/18/13 97.7 F (36.5 C) Oral   Pulse Readings from Last 3 Encounters:  03/30/13 82  03/30/13 82  03/18/13 58   SpO2 Readings from Last 3 Encounters:  03/30/13 99%  03/30/13 99%  03/18/13 99%     Intake/Output Summary (Last 24 hours) at 03/30/13 0748 Last data filed at 03/30/13 0422  Gross per 24 hour  Intake   2645 ml  Output    825 ml  Net   1820 ml    Extremities: Incisions clean, dry and intact DP/PT monophasic by doppler on right  LABS: Lab Results  Component Value Date   WBC 10.5 03/30/2013   HGB 8.3* 03/30/2013   HCT 25.8* 03/30/2013   MCV 89.3 03/30/2013   PLT 158 03/30/2013   Lab Results  Component Value Date   CREATININE 1.97* 03/30/2013   Lab Results  Component Value Date   INR 1.09 03/18/2013       ASSESSMENT: 2 Days Post-Op s/p Right common femoral to below knee popliteal artery bypass graft with 6 mm propatent PTFE  with healing incisions Acute blood loss anemia -  HCT 25.8  sCR elevated 1.97/BUN 31 - acute on chronic kidney disease  PLAN: Transfuse 2UPC  Repeat BMET in AM Hold lasix this am Ambulate Transfer to floor GU: Foley out POD - neurogenic bladder sec to back surgery - pt straight caths himself at home  Wound Management: cont dressings to right leg  Addendum  I have independently interviewed and examined the patient, and I agree with the  physician assistant's findings.  2 U prbc for H/H drop and known CAD.  Diuresis after transfusion to avoid fluid overload.  Will hold normal 80 mg dose for today to see if pt is dehydrated.  Ok to trfr.  Leonides Sake, MD [facility] [place] Office: [phone] Pager: [phone]  03/30/2013, 8:22 AM

## 2013-03-31 ENCOUNTER — Telehealth: Payer: Self-pay | Admitting: Surgery

## 2013-03-31 DIAGNOSIS — Z48812 Encounter for surgical aftercare following surgery on the circulatory system: Secondary | ICD-10-CM

## 2013-03-31 LAB — TYPE AND SCREEN
ABO/RH(D): O POS
Unit division: 0

## 2013-03-31 LAB — CBC
HCT: 30.3 % — ABNORMAL LOW (ref 39.0–52.0)
MCH: 28 pg (ref 26.0–34.0)
MCHC: 32 g/dL (ref 30.0–36.0)
MCV: 87.6 fL (ref 78.0–100.0)
RDW: 15.8 % — ABNORMAL HIGH (ref 11.5–15.5)

## 2013-03-31 MED ORDER — ALBUTEROL SULFATE (5 MG/ML) 0.5% IN NEBU
2.5000 mg | INHALATION_SOLUTION | RESPIRATORY_TRACT | Status: DC | PRN
  Administered 2013-04-01 – 2013-04-02 (×2): 2.5 mg via RESPIRATORY_TRACT
  Filled 2013-03-31 (×4): qty 0.5

## 2013-03-31 MED ORDER — ALBUTEROL SULFATE HFA 108 (90 BASE) MCG/ACT IN AERS: 2.0000 | INHALATION_SPRAY | RESPIRATORY_TRACT | Status: DC | PRN

## 2013-03-31 NOTE — Consult Note (Signed)
WOC consult Note Reason for Consult: Consult requested for right leg wounds.  [facility] is following for assessment and plan of care to left leg and hydrogel has been ordered for that site.  Pt is followed by the outpatient wound care center prior to admission and has been using Santyl and receiving serial debridements.  Santyl has already been ordered at this time. Pt recently received a graft to improve circulatory status. Wife at bedside and both pt and wife are well-informed regarding topical treatment for these wounds. Wound type:Right calf with 2 areas of full thickness stasis ulcers. Measurement:inner leg 100% yellow slough; 2.2X2.5cm, small amt yellow drainage, no odor. Right outer leg full thickness 4X3X.3cm, 90% yellow slough, 10% red.  Mod amt yellow drainage, no odor.  Periwound: Intact skin surrounding Dressing procedure/placement/frequency: Continue present plan of care with Santyl for chemical debridement of nonviable tissue. Pt can resume follow-up at the outpatient wound care center after discharge. Please re-consult if further assistance is needed.  Thank-you,  Cammie Mcgee MSN, RN, CWOCN, [place], CNS [phone]

## 2013-03-31 NOTE — Progress Notes (Addendum)
[facility]  Subjective  - POD #3  Orthostatic yesterday.  Feels better this am Walked with PT   Physical Exam:  Incisions intact.  Slight erythema at distal incision Ulcer stable       Assessment/Plan:  POD #3  Orthostatic hypotension - received additional fluid yesterday and feels better today.  Foley left in place to monitor UOP given orthostasis and decreased UOP.  WIll d/c foley today  Transfer to floor  Start ancef for redness around distal incision  Acute blood oss anemia - good response to PRBC   Appreciate PT help.  Will monitor, may need CIR/SNF vs Home PT  Estell Puccini IV, V. WELLS 03/31/2013 7:34 AM --  Filed Vitals:   03/31/13 0344  BP: 134/54  Pulse: 86  Temp: 98.4 F (36.9 C)  Resp: 21    Intake/Output Summary (Last 24 hours) at 03/31/13 0734 Last data filed at 03/31/13 0344  Gross per 24 hour  Intake 883.75 ml  Output   1000 ml  Net -116.25 ml     Laboratory CBC    Component Value Date/Time   WBC 9.3 03/31/2013 0423   WBC 9.1 02/06/2013 1127   HGB 9.7* 03/31/2013 0423   HGB 9.8* 02/06/2013 1127   HCT 30.3* 03/31/2013 0423   HCT 31.3* 02/06/2013 1127   PLT 159 03/31/2013 0423   PLT 178 02/06/2013 1127    BMET    Component Value Date/Time   NA 138 03/30/2013 0543   NA 139 02/06/2013 1127   K 4.5 03/30/2013 0543   K 4.3 02/06/2013 1127   CL 103 03/30/2013 0543   CO2 28 03/30/2013 0543   CO2 31* 02/06/2013 1127   GLUCOSE 130* 03/30/2013 0543   GLUCOSE 152* 02/06/2013 1127   BUN 31* 03/30/2013 0543   BUN 24.1 02/06/2013 1127   CREATININE 1.97* 03/30/2013 0543   CREATININE 1.0 02/06/2013 1127   CALCIUM 8.1* 03/30/2013 0543   CALCIUM 9.4 02/06/2013 1127   GFRNONAA 31* 03/30/2013 0543   GFRAA 37* 03/30/2013 0543    COAG Lab Results  Component Value Date   INR 1.09 03/18/2013   INR 1.05 12/23/2012   INR 1.08 08/17/2012   No results found for this basename: PTT    Antibiotics Anti-infectives   Start     Dose/Rate Route  Frequency Ordered Stop   03/28/13 1400  cefUROXime (ZINACEF) 1.5 g in dextrose 5 % 50 mL IVPB     1.5 g 100 mL/hr over 30 Minutes Intravenous Every 12 hours 03/28/13 1346 03/29/13 0309   03/27/13 1432  cefUROXime (ZINACEF) 1.5 g in dextrose 5 % 50 mL IVPB     1.5 g 100 mL/hr over 30 Minutes Intravenous 30 min pre-op 03/27/13 1432 03/28/13 0800       V. Charlena Cross, M.D. [facility] Office: [phone] Pager:  [phone]

## 2013-03-31 NOTE — Telephone Encounter (Signed)
Message copied by Margaretmary Eddy on Mon Mar 31, 2013  2:35 PM ------      Message from: [place], [place] K      Created: Mon Mar 31, 2013 12:15 PM      Regarding: Schedule       You may already have this note.             ----- Message -----         From: Dara Lords, PA-C         Sent: 03/28/2013  12:10 PM           To: Sharee Pimple, CMA            S/p right fem pop bypass 03/28/13.  F/u with Dr. Myra Gianotti in 2 weeks.            Thanks,      Lelon Mast       ------

## 2013-03-31 NOTE — Progress Notes (Signed)
Occupational Therapy Evaluation Patient Details Name: [name] MRN: [id number] DOB: 1935-11-15 Today's Date: 03/31/2013 Time: 7829-5621 OT Time Calculation (min): 24 min  OT Assessment / Plan / Recommendation History of present illness Pt underwent fem pop bypass graft 03-28-13.  Prior to this procedure he was walking daily for exercise.  He uses continuous O2 at home.   Clinical Impression   Completed education regarding AE.DME and E conservation. Pt familiar with all concepts. Has all AE &DME needed. No further OT needs. Pt's wife concerned with pt's ability to negotiate stairs - discussed with PT. OT signing off.    OT Assessment  Patient does not need any further OT services    Follow Up Recommendations  No OT follow up    Barriers to Discharge      Equipment Recommendations  None recommended by OT    Recommendations for Other Services    Frequency       Precautions / Restrictions Precautions Precautions: None Restrictions Weight Bearing Restrictions: No   Pertinent Vitals/Pain no apparent distress     ADL  Lower Body Bathing: Minimal assistance Where Assessed - Lower Body Bathing: Unsupported sit to stand Lower Body Dressing: Minimal assistance Where Assessed - Lower Body Dressing: Supported sit to stand Toilet Transfer: Supervision/safety Statistician Method: Sit to stand Equipment Used: Gait belt;Rolling walker;Sock aid;Reacher Transfers/Ambulation Related to ADLs: minguard ADL Comments: Pt has AE for LB ADL. Educated on E conservation techniques.    OT Diagnosis:    OT Problem List:   OT Treatment Interventions:     OT Goals(Current goals can be found in the care plan section) Acute Rehab OT Goals Patient Stated Goal: home OT Goal Formulation:  (eval only)  Visit Information  Last OT Received On: 03/31/13 Assistance Needed: +1 History of Present Illness: Pt underwent fem pop bypass graft 03-28-13.  Prior to this procedure he was walking daily  for exercise.  He uses continuous O2 at home.       Prior Functioning     Home Living Family/patient expects to be discharged to:: Private residence Living Arrangements: Spouse/significant other Available Help at Discharge: Family;Available 24 hours/day Type of Home: House Home Access: Stairs to enter [employer] of Steps: 5 Entrance Stairs-Rails: Right;Left;Can reach both Home Layout: Two level;Able to live on main level with bedroom/bathroom Alternate Level Stairs-Number of Steps: 12 Alternate Level Stairs-Rails: Can reach both Home Equipment: Cane - single point;Walker - 2 wheels;Wheelchair - manual;Shower seat;Grab bars - toilet;Grab bars - tub/shower Prior Function Level of Independence: Independent with assistive device(s) Communication Communication: No difficulties Dominant Hand: Right         Vision/Perception     Cognition  Cognition Arousal/Alertness: Awake/alert Behavior During Therapy: WFL for tasks assessed/performed Overall Cognitive Status: Within Functional Limits for tasks assessed    Extremity/Trunk Assessment Upper Extremity Assessment Upper Extremity Assessment: Overall WFL for tasks assessed Lower Extremity Assessment Lower Extremity Assessment: Overall WFL for tasks assessed Cervical / Trunk Assessment Cervical / Trunk Assessment: Normal     Mobility Bed Mobility Bed Mobility: Supine to Sit Rolling Right: 6: Modified independent (Device/Increase time) Right Sidelying to Sit: 6: Modified independent (Device/Increase time) Details for Bed Mobility Assistance: Pt needed cues and guard asssist to get to EOB.   Transfers Sit to Stand: With upper extremity assist;4: Min guard;From bed Stand to Sit: 4: Min guard;With upper extremity assist;To chair/3-in-1;With armrests Details for Transfer Assistance: verbal cues for hand placement, safety, posture     Exercise General  Exercises - Lower Extremity Ankle Circles/Pumps: AROM;Both;10  reps;Seated Long Arc Quad: AROM;Both;10 reps;Seated Hip Flexion/Marching: AROM;Both;10 reps;Seated Other Exercises Other Exercises: UE theraband ex   Balance     End of Session OT - End of Session Equipment Utilized During Treatment: Gait belt;Rolling walker Activity Tolerance: Patient tolerated treatment well Patient left: in chair;with call bell/phone within reach;with family/visitor present Nurse Communication: Mobility status  GO     Kylil Swopes,HILLARY 03/31/2013, 2:04 PM [facility], OTR/L  [phone] 03/31/2013

## 2013-03-31 NOTE — Progress Notes (Signed)
Physical Therapy Treatment Patient Details Name: [name] MRN: [id number] DOB: 1936-04-05 Today's Date: 03/31/2013 Time: 1027-2536 PT Time Calculation (min): 27 min  PT Assessment / Plan / Recommendation  History of Present Illness Pt underwent fem pop bypass graft 03-28-13.  Prior to this procedure he was walking daily for exercise.  He uses continuous O2 at home.   PT Comments   Pt admitted with above. Pt currently with functional limitations due to continued deficits with posture and right LE pain.   HHPT f/u recommended.    Pt will benefit from skilled PT to increase their independence and safety with mobility to allow discharge to the venue listed below.   Follow Up Recommendations  Home health PT;Supervision/Assistance - 24 hour                 Equipment Recommendations  None recommended by PT        Frequency Min 3X/week   Progress towards PT Goals Progress towards PT goals: Progressing toward goals  Plan Current plan remains appropriate    Precautions / Restrictions Precautions Precautions: None Restrictions Weight Bearing Restrictions: No   Pertinent Vitals/Pain VSS, Some right LE pain per pt    Mobility  Bed Mobility Bed Mobility: Rolling Right;Right Sidelying to Sit Rolling Right: 4: Min guard Right Sidelying to Sit: 4: Min guard;HOB elevated Details for Bed Mobility Assistance: Pt needed cues and guard asssist to get to EOB.   Transfers Transfers: Sit to Stand;Stand to Sit Sit to Stand: With upper extremity assist;4: Min guard;From bed Stand to Sit: 4: Min guard;With upper extremity assist;To chair/3-in-1;With armrests Details for Transfer Assistance: verbal cues for hand placement, safety, posture Ambulation/Gait Ambulation/Gait Assistance: 4: Min guard Ambulation Distance (Feet): 300 Feet Assistive device: Rolling walker Ambulation/Gait Assistance Details: Continuous O2 with ambulation, Needed some cues to stay close to RW.   Gait Pattern:  Antalgic;Trunk flexed Gait velocity: decreased Stairs: No Wheelchair Mobility Wheelchair Mobility: No    Exercises General Exercises - Lower Extremity Ankle Circles/Pumps: AROM;Both;10 reps;Seated Long Arc Quad: AROM;Both;10 reps;Seated Hip Flexion/Marching: AROM;Both;10 reps;Seated   PT Goals (current goals can now be found in the care plan section)    Visit Information  Last PT Received On: 03/31/13 Assistance Needed: +1 PT/OT Co-Evaluation/Treatment: Yes History of Present Illness: Pt underwent fem pop bypass graft 03-28-13.  Prior to this procedure he was walking daily for exercise.  He uses continuous O2 at home.    Subjective Data  Subjective: "I feel better."   Cognition  Cognition Arousal/Alertness: Awake/alert Behavior During Therapy: WFL for tasks assessed/performed Overall Cognitive Status: Within Functional Limits for tasks assessed    Balance     End of Session PT - End of Session Equipment Utilized During Treatment: Gait belt;Oxygen Activity Tolerance: Patient tolerated treatment well Patient left: in chair;with call bell/phone within reach;with family/visitor present Nurse Communication: Mobility status        INGOLD,Arely Tinner 03/31/2013, 12:57 PM [facility] [phone] (pager)

## 2013-03-31 NOTE — Telephone Encounter (Signed)
Pt's home number temporarily disconnected, contacted wife Maralyn Sago on cell phone. Gave appointment information, r/s due to schedule conflict - kf

## 2013-03-31 NOTE — Progress Notes (Signed)
Pt report called to 2W, wife at bedside, foley removed at 900am per Md order, meds current to time. Dsg on both legs changes at 930. Will continue to monitor.

## 2013-04-01 ENCOUNTER — Encounter (HOSPITAL_COMMUNITY): Payer: Self-pay | Admitting: Surgery

## 2013-04-01 LAB — BASIC METABOLIC PANEL
BUN: 34 mg/dL — ABNORMAL HIGH (ref 6–23)
Calcium: 8.9 mg/dL (ref 8.4–10.5)
Chloride: 100 mEq/L (ref 96–112)
Creatinine, Ser: 1.19 mg/dL (ref 0.50–1.35)
GFR calc Af Amer: 67 mL/min — ABNORMAL LOW (ref >90)
GFR calc non Af Amer: 58 mL/min — ABNORMAL LOW (ref >90)
Glucose, Bld: 151 mg/dL — ABNORMAL HIGH (ref 70–99)
Potassium: 4.1 mEq/L (ref 3.5–5.1)

## 2013-04-01 MED ORDER — FUROSEMIDE 10 MG/ML IJ SOLN
INTRAMUSCULAR | Status: AC
  Filled 2013-04-01: qty 4

## 2013-04-01 MED ORDER — FUROSEMIDE 10 MG/ML IJ SOLN
20.0000 mg | Freq: Once | INTRAMUSCULAR | Status: AC
  Administered 2013-04-01: 20 mg via INTRAVENOUS

## 2013-04-01 NOTE — Progress Notes (Signed)
VASCULAR LAB PRELIMINARY  ARTERIAL  ABI completed:    RIGHT    LEFT    PRESSURE WAVEFORM  PRESSURE WAVEFORM  BRACHIAL 147 Triphasic BRACHIAL 152 Triphasic  AT 165 Biphasic AT >300 Monophasic  PT 124 Biphasic PT 87 Monophasic    RIGHT LEFT  ABI 1.09 N/A   Right ABI indicates normal arterial flow with Doppler waveforms within normal limits. Left anterior tibial artery  ABI could not be ascertained due to incompressible vessels probably secondary to calcification. ABI of the posterior tibial artery indicates a moderate to severe reduction in arterial flow. Doppler waveforms are abnormal.   Darrick Greenlaw, RVS 04/01/2013, 4:37 PM

## 2013-04-01 NOTE — Progress Notes (Signed)
I agree with the above.  Slight separation of proximal groin incision.  Nothing to pack.  Mild SOB. Will consult Pulm.  WElls [employer]

## 2013-04-01 NOTE — Progress Notes (Signed)
Patient ambulated 126ft using a rolling walker with RN.  Patient had a slow, steady pace.  Patient tolerated ambulation well. Vitals signs remained stable. Patient returned to chair resting. Will continue to monitor.

## 2013-04-01 NOTE — Progress Notes (Signed)
CSW received referral for SNF at dc. CSW reviewed chart and noticed that plan is for Home Health. CSW signing off. Please re consult if CSW needs arise.  Maree Krabbe, MSW, [facility]ors [phone]

## 2013-04-01 NOTE — Progress Notes (Addendum)
[facility] Progress Note  04/01/2013 7:30 AM 4 Days Post-Op  Subjective:  States he spilled his coffee on him this morning-states he was not hurt or burned.  Wants to go home.  afebrile 100's-130's systolic HR 70's-80's regular 40% 2LO2NC  Filed Vitals:   04/01/13 0549  BP: 135/47  Pulse: 74  Temp: 98.1 F (36.7 C)  Resp: 18    Physical Exam: Incisions:  Right groin incision with ecchymosis; erythema appears improved.  Slight opening at proximal portion of incision.  Right BK incision is c/d/i. Extremities:  Right foot is warm.  + edema RLE.  CBC    Component Value Date/Time   WBC 9.3 03/31/2013 0423   WBC 9.1 02/06/2013 1127   RBC 3.46* 03/31/2013 0423   RBC 3.52* 02/06/2013 1127   HGB 9.7* 03/31/2013 0423   HGB 9.8* 02/06/2013 1127   HCT 30.3* 03/31/2013 0423   HCT 31.3* 02/06/2013 1127   PLT 159 03/31/2013 0423   PLT 178 02/06/2013 1127   MCV 87.6 03/31/2013 0423   MCV 88.9 02/06/2013 1127   MCH 28.0 03/31/2013 0423   MCH 27.8 02/06/2013 1127   MCHC 32.0 03/31/2013 0423   MCHC 31.3* 02/06/2013 1127   RDW 15.8* 03/31/2013 0423   RDW 15.5* 02/06/2013 1127   LYMPHSABS 0.7* 02/06/2013 1127   LYMPHSABS 0.9 10/14/2012 1328   MONOABS 0.6 02/06/2013 1127   MONOABS 0.6 10/14/2012 1328   EOSABS 0.0 02/06/2013 1127   EOSABS 0.1 10/14/2012 1328   BASOSABS 0.0 02/06/2013 1127   BASOSABS 0.0 10/14/2012 1328    BMET    Component Value Date/Time   NA 138 03/30/2013 0543   NA 139 02/06/2013 1127   K 4.5 03/30/2013 0543   K 4.3 02/06/2013 1127   CL 103 03/30/2013 0543   CO2 28 03/30/2013 0543   CO2 31* 02/06/2013 1127   GLUCOSE 130* 03/30/2013 0543   GLUCOSE 152* 02/06/2013 1127   BUN 31* 03/30/2013 0543   BUN 24.1 02/06/2013 1127   CREATININE 1.97* 03/30/2013 0543   CREATININE 1.0 02/06/2013 1127   CALCIUM 8.1* 03/30/2013 0543   CALCIUM 9.4 02/06/2013 1127   GFRNONAA 31* 03/30/2013 0543   GFRAA 37* 03/30/2013 0543    INR    Component Value Date/Time   INR 1.09 03/18/2013 1509      Intake/Output Summary (Last 24 hours) at 04/01/13 0730 Last data filed at 04/01/13 0550  Gross per 24 hour  Intake    240 ml  Output   1550 ml  Net  -1310 ml   Respiratory Cx is in process  Assessment:  77 y.o. male is s/p:  Right common femoral to below knee popliteal artery bypass graft with 6 mm propatent PTFE, and extensive right common femoral endarterectomy and the distal external iliac artery endarterectomy  4 Days Post-Op  Plan: -pt states he has been ambulating-continue to mobilize -continue ABx for erythema of right groin -culture is pending for respiratory sputum -beginning of maceration to right groin and slight separation to proximal incision-clean wound and keep dry with gauze to right groin.  Discussed with pt the need to keep right groin dry and for him not to apply lotion or powder to right groin. -DVT prophylaxis:  Lovenox -jump in BUN/Cr yesterday from 18/1.2 to 31/1.97-pt with good UOP-will ck BMP this am.  Discontinue ARB at this time.   Doreatha Massed, PA-C [facility] [phone] 04/01/2013 7:30 AM

## 2013-04-01 NOTE — Care Management Note (Signed)
    Page 1 of 2   04/02/2013     10:49:03 AM   CARE MANAGEMENT NOTE 04/02/2013  Patient:  [name], [name]   Account Number:  [id number]  Date Initiated:  04/01/2013  Documentation initiated by:  Tariq Pernell  Subjective/Objective Assessment:   PT ADM S/P FEM POP BYPASS GRAFT ON 03/28/13.  PTA, PT INDEPENDENT, LIVES WITH SPOUSE.  PT IS ON CHRONIC HOME OXYGEN.     Action/Plan:   WILL FOLLOW FOR HOME NEEDS AS PT PROGRESSES.   Anticipated DC Date:  04/02/2013   Anticipated DC Plan:  HOME W HOME HEALTH SERVICES      DC Planning Services  CM consult      [facility] Choice  HOME HEALTH   Choice offered to / List presented to:  C-1 Patient        HH arranged  HH-2 PT      [facility] agency  [facility]   Status of service:  Completed, signed off Medicare Important Message given?   (If response is "NO", the following Medicare IM given date fields will be blank) Date Medicare IM given:   Date Additional Medicare IM given:    Discharge Disposition:  HOME W HOME HEALTH SERVICES  Per UR Regulation:  Reviewed for med. necessity/level of care/duration of stay  If discussed at Long Length of Stay Meetings, dates discussed:    Comments:  04/02/13 Rosalita Chessman [phone] PT FOR DC HOME TODAY.  NEEDS HHPT FOLLOW UP.  MET WITH PT AND WIFE TO ARRANGE SERVICES.  REFERRAL TO [facility], PER PT CHOICE.  START OF CARE 24-48H POST DC DATE.  WIFE REQUESTS PT WORK WITH P.T. ON STAIRS PRIOR TO DC, AS HE HAS STAIRS AT HIS HOME.  NOTIFIED PHYSICAL THERAPIST ASSIGNED TO PT TO REQUEST SESSION.

## 2013-04-02 LAB — BASIC METABOLIC PANEL
BUN: 30 mg/dL — ABNORMAL HIGH (ref 6–23)
Calcium: 9.1 mg/dL (ref 8.4–10.5)
Chloride: 99 mEq/L (ref 96–112)
Glucose, Bld: 135 mg/dL — ABNORMAL HIGH (ref 70–99)
Potassium: 4 mEq/L (ref 3.5–5.1)

## 2013-04-02 MED ORDER — HYDROCODONE-ACETAMINOPHEN 2.5-500 MG PO TABS: 1.0000 | ORAL_TABLET | Freq: Four times a day (QID) | ORAL | Status: DC | PRN

## 2013-04-02 MED ORDER — CEPHALEXIN 500 MG PO CAPS: 500.0000 mg | ORAL_CAPSULE | Freq: Three times a day (TID) | ORAL | Status: DC

## 2013-04-02 MED ORDER — OXYCODONE HCL 5 MG PO TABS: 5.0000 mg | ORAL_TABLET | Freq: Four times a day (QID) | ORAL | Status: DC | PRN

## 2013-04-02 NOTE — Discharge Summary (Signed)
[facility] Discharge Summary  [name] [birth year] 77 y.o. male  [id number]  Admission Date: 03/28/2013  Discharge Date: 04/02/13  Physician: Nada Libman, MD  Admission Diagnosis: PVD   HPI:   This is a 77 y.o. male who is back today for followup. On 06/28/2012 he underwent left common femoral, superficial femoral, and profunda femoral endarterectomy with background patch angioplasty. This was done in the setting of severe left leg claudication. We have discussed multiple options including femoral-popliteal bypass grafting, however because of his comorbidities I elected to proceed with femoral endarterectomy as I felt this would alleviate his symptoms. He has also undergone stenting of his left common iliac artery and right external iliac artery. He developed bilateral lower extremity ulcerations approximately 2 months ago. Because of his comorbidities I elected to exhaust noninvasive treatments to see if we could get his wounds to heal. He was referred to the wound center. He has been undergoing wound care for the past month. The wound on his left leg have essentially healed. On the right leg, however he has 2 large ulcers which have not improved. He complains of rest pain particularly when elevating his legs.  Hospital Course:  The patient was admitted to the hospital and taken to the operating room on 03/28/2013 and underwent: Right common femoral to below knee popliteal artery bypass graft with 6 mm propatent PTFE, and extensive right common femoral endarterectomy and the distal external iliac artery endarterectomy   The pt tolerated the procedure well and was transported to the PACU in good condition.   He did undergo transfusion for acute surgical blood loss anemia due to his hx of CAD.  On POD 2, he was orthostatic and received additional fluid and felt better.  His foley catheter was left in place to monitor UOP given orthostasis and decreased UOP.  It was  removed on POD 3.  He was transferred to the telemetry floor on POD 3.    WOC was consulted for right leg wounds.  He is followed outpatient at the wound care center and are to continue santyl for chemical debridement of nonviable tissue.    On POD 4, he did have a slight opening at the proximal portion of his incision.  He ws started on Abx for erythema of his right groin.  A respiratory cx was obtained and gram stain revealed few GPC in pairs.  He was started on ABx after Dr. Myra Gianotti spoke with pulmonary MD about ABx (Keflex) to start that would cover wound and respiratory..  Final cx has come back normal oropharyngeal flora.  Also on POD 4, he did have a jump in his creatinine, but this was likely lab error given trend before and after this particular lab draw.  His ARB was discontinued at that time, but restarted when later lab revealed normal creatinine.  Postoperative ABI's completed on 04/01/13 are as follows:  RIGHT    LEFT     PRESSURE  WAVEFORM   PRESSURE  WAVEFORM   BRACHIAL  147  Triphasic  BRACHIAL  152  Triphasic   AT  165  Biphasic  AT  >300  Monophasic   PT  124  Biphasic  PT  87  Monophasic     RIGHT  LEFT   ABI  1.09  N/A     The remainder of the hospital course consisted of increasing mobilization and increasing intake of solids without difficulty.  CBC    Component Value Date/Time  WBC 9.3 03/31/2013 0423   WBC 9.1 02/06/2013 1127   RBC 3.46* 03/31/2013 0423   RBC 3.52* 02/06/2013 1127   HGB 9.7* 03/31/2013 0423   HGB 9.8* 02/06/2013 1127   HCT 30.3* 03/31/2013 0423   HCT 31.3* 02/06/2013 1127   PLT 159 03/31/2013 0423   PLT 178 02/06/2013 1127   MCV 87.6 03/31/2013 0423   MCV 88.9 02/06/2013 1127   MCH 28.0 03/31/2013 0423   MCH 27.8 02/06/2013 1127   MCHC 32.0 03/31/2013 0423   MCHC 31.3* 02/06/2013 1127   RDW 15.8* 03/31/2013 0423   RDW 15.5* 02/06/2013 1127   LYMPHSABS 0.7* 02/06/2013 1127   LYMPHSABS 0.9 10/14/2012 1328   MONOABS 0.6 02/06/2013 1127   MONOABS 0.6 10/14/2012 1328    EOSABS 0.0 02/06/2013 1127   EOSABS 0.1 10/14/2012 1328   BASOSABS 0.0 02/06/2013 1127   BASOSABS 0.0 10/14/2012 1328    BMET    Component Value Date/Time   NA 138 04/01/2013 0925   NA 139 02/06/2013 1127   K 4.1 04/01/2013 0925   K 4.3 02/06/2013 1127   CL 100 04/01/2013 0925   CO2 31 04/01/2013 0925   CO2 31* 02/06/2013 1127   GLUCOSE 151* 04/01/2013 0925   GLUCOSE 152* 02/06/2013 1127   BUN 34* 04/01/2013 0925   BUN 24.1 02/06/2013 1127   CREATININE 1.19 04/01/2013 0925   CREATININE 1.0 02/06/2013 1127   CALCIUM 8.9 04/01/2013 0925   CALCIUM 9.4 02/06/2013 1127   GFRNONAA 58* 04/01/2013 0925   GFRAA 67* 04/01/2013 0925   BMET    Component Value Date/Time   NA 138 04/02/2013 1145   NA 139 02/06/2013 1127   K 4.0 04/02/2013 1145   K 4.3 02/06/2013 1127   CL 99 04/02/2013 1145   CO2 33* 04/02/2013 1145   CO2 31* 02/06/2013 1127   GLUCOSE 135* 04/02/2013 1145   GLUCOSE 152* 02/06/2013 1127   BUN 30* 04/02/2013 1145   BUN 24.1 02/06/2013 1127   CREATININE 1.07 04/02/2013 1145   CREATININE 1.0 02/06/2013 1127   CALCIUM 9.1 04/02/2013 1145   CALCIUM 9.4 02/06/2013 1127   GFRNONAA 66* 04/02/2013 1145   GFRAA 76* 04/02/2013 1145      Discharge Instructions:   The patient is discharged to home with extensive instructions on wound care and progressive ambulation.  They are instructed not to drive or perform any heavy lifting until returning to see the physician in his office.   Future Appointments Provider Department Dept Phone   04/14/2013 12:15 PM Nada Libman, MD [facility] [phone]   05/05/2013 3:15 PM Delcie Roch [facility] MEDICAL ONCOLOGY [phone]   05/05/2013 3:45 PM Ladene Artist, MD [facility] MEDICAL ONCOLOGY [phone]   05/12/2013 8:55 AM [facility] [place] Office [phone]   06/16/2013 1:30 PM Barbaraann Share, MD [facility] [phone]   07/21/2013 8:45 AM [facility] [place] Office [phone]   10/30/2013 2:00 PM Lesleigh Noe, MD [facility] [phone]      Discharge Diagnosis:  PVD  Secondary Diagnosis: Patient Active Problem List   Diagnosis Date Noted  . Occlusion and stenosis of carotid artery without mention of cerebral infarction 01/27/2013  . PVD (peripheral vascular disease) 01/27/2013  . Atherosclerosis of native arteries of the extremities with ulceration(440.23) 01/27/2013  . Pain in limb- bilateral leg 01/27/2013  . Atherosclerosis of native  arteries of the extremities with intermittent claudication 02/12/2012  . Subdural hematoma 12/18/2011  . Syncope and collapse 12/16/2011  . TIA (transient ischemic attack) 12/16/2011  . Chronic systolic heart failure 12/05/2011  . CHRONIC OBSTRUCTIVE PULMONARY DISEASE, ACUTE EXACERBATION 05/06/2010  . ATRIAL FIBRILLATION 12/15/2008  . COLON CANCER 10/31/2008  . HYPOKALEMIA 10/31/2008  . HYPERTENSION 10/31/2008  . VENTRICULAR TACHYCARDIA 10/31/2008  . CHF 10/31/2008  . PVD 10/31/2008  . AUTOMATIC IMPLANTABLE CARDIAC DEFIBRILLATOR SITU 10/31/2008  . DYSLIPIDEMIA 04/16/2007  . CORONARY ARTERY DISEASE 04/16/2007  . DISEASE, ISCHEMIC HEART, CHRONIC NEC 04/16/2007  . COPD (chronic obstructive pulmonary disease) with emphysema 04/16/2007   Past Medical History  Diagnosis Date  . Hypokalemia   . Colon cancer   . PVD (peripheral vascular disease)   . HTN (hypertension)   . Automatic implantable cardiac defibrillator in situ   . CHF (congestive heart failure)   . Ventricular tachycardia   . Ischemic heart disease, chronic   . Dyslipidemia   . CAD (coronary artery disease)   . COPD (chronic obstructive pulmonary disease)   . Emphysema   . On home oxygen therapy   . Pacemaker   . Self-catheterizes urinary bladder     since back surgery.  6- 7 times a day.  . Difficult intubation     pt states "not aware of difficult intubation, never told"  . Heart  murmur     dx 1974  . Stroke     TIA- 1980's  . Shortness of breath   . Diabetes mellitus without complication     boderline  . Head injury, closed, with brief LOC 2013  . Anemia   . Neurogenic bladder     since back surgery - self caths  . Neurogenic bowel     digitally stimulates daily.       Medication List         albuterol (2.5 MG/3ML) 0.083% nebulizer solution  Commonly known as:  PROVENTIL  Take 2.5 mg by nebulization 4 (four) times daily.     albuterol 108 (90 BASE) MCG/ACT inhaler  Commonly known as:  PROVENTIL HFA;VENTOLIN HFA  Inhale 2 puffs into the lungs as needed. Inhale 1-2 puffs into the lungs every 6 (six) hours as needed. Shortness of breath     amLODipine 5 MG tablet  Commonly known as:  NORVASC  Take 5 mg by mouth every morning.     aspirin 325 MG tablet  Take 325 mg by mouth daily.     budesonide 0.5 MG/2ML nebulizer solution  Commonly known as:  PULMICORT  Take 0.5 mg by nebulization 2 (two) times daily.     candesartan 32 MG tablet  Commonly known as:  ATACAND  Take 32 mg by mouth daily.     carvedilol 25 MG tablet  Commonly known as:  COREG  Take 25 mg by mouth 2 (two) times daily with a meal.     cephALEXin 500 MG capsule  Commonly known as:  KEFLEX  Take 1 capsule (500 mg total) by mouth 3 (three) times daily.     collagenase ointment  Commonly known as:  SANTYL  Apply 1 application topically daily.     digoxin 0.125 MG tablet  Commonly known as:  LANOXIN  Take 125 mcg by mouth every morning.     ferrous sulfate 325 (65 FE) MG tablet  Take 325 mg by mouth at bedtime.     furosemide 40 MG tablet  Commonly known as:  LASIX  Take 40-80 mg by mouth 2 (two) times daily. Take 2 tablets in the morning and 1 tablet in the evening     guaiFENesin 600 MG 12 hr tablet  Commonly known as:  MUCINEX  Take 600 mg by mouth 2 (two) times daily as needed. congestion     HYDROcodone-acetaminophen 2.5-500 MG per tablet  Commonly known as:   VICODIN  Take 1 tablet by mouth every 6 (six) hours as needed. Back pain     nitroGLYCERIN 0.4 MG SL tablet  Commonly known as:  NITROSTAT  Place 0.4 mg under the tongue every 5 (five) minutes as needed for chest pain.     nitroGLYCERIN 0.2 mg/hr patch  Commonly known as:  NITRODUR - Dosed in mg/24 hr  Place 1 patch onto the skin daily.     potassium chloride SA 20 MEQ tablet  Commonly known as:  K-DUR,KLOR-CON  Take 1 tablet (20 mEq total) by mouth daily.     predniSONE 10 MG tablet  Commonly known as:  DELTASONE  Take 10 mg by mouth daily.     simvastatin 20 MG tablet  Commonly known as:  ZOCOR  Take 20 mg by mouth at bedtime.     tiotropium 18 MCG inhalation capsule  Commonly known as:  SPIRIVA  Place 18 mcg into inhaler and inhale daily.        Vicodin #30 No Refill  Disposition: home with HHPT  Patient's condition: is Good  Follow up: 1. Dr. Myra Gianotti in 2 weeks (April 14, 2013)   Doreatha Massed, [place] [facility] [phone] 04/02/2013  11:25 AM  - For VQI Registry use --- Instructions: Press F2 to tab through selections.  Delete question if not applicable.   Post-op:  Wound infection: No  Graft infection: No  Transfusion: Yes  If yes, 2 units given New Arrhythmia: No Ipsilateral amputation: No, [ ]  Minor, [ ]  BKA, [ ]  AKA Discharge patency: [ x] Primary, [ ]  Primary assisted, [ ]  Secondary, [ ]  Occluded Patency judged by: [x ] Dopper only, [ ]  Palpable graft pulse, [ ]  Palpable distal pulse, [ ]  ABI inc. > 0.15, [ ]  Duplex Discharge ABI: R 1.09, L n/a Discharge TBI: R , L  D/C Ambulatory Status: Ambulatory with Assistance  Complications: MI: No, [ ]  Troponin only, [ ]  EKG or Clinical CHF: Yes Resp failure:No, [ ]  Pneumonia, [ ]  Ventilator Chg in renal function: No, [ ]  Inc. Cr > 0.5, [ ]  Temp. Dialysis, [ ]  Permanent dialysis Stroke: No, [ ]  Minor, [ ]  Major Return to OR: No  Reason for return to OR: [ ]  Bleeding, [ ]   Infection, [ ]  Thrombosis, [ ]  Revision  Discharge medications: Statin use:  Yes ASA use:  Yes Plavix use:  No  for medical reason not indicated Beta blocker use: Yes Coumadin use: No  for medical reason not indicated

## 2013-04-02 NOTE — Progress Notes (Signed)
PT Cancellation Note  Patient Details Name: [name] MRN: [id number] DOB: 1935/12/03   Cancelled Treatment:    Reason Eval/Treat Not Completed: Patient declined, no reason specified.  Pt states he is going home today and wants to rest until he goes home.  Will check back tomorrow if pt does not D/C.     [name], [place] [phone] 04/02/2013, 9:45 AM

## 2013-04-02 NOTE — Progress Notes (Signed)
[facility] Progress Note  04/02/2013 8:13 AM 5 Days Post-Op  Subjective:  "I'm ready to go home".  States his breathing is better.  Afebrile HR 60's-70's 120's-150's systolic 98% RA  Filed Vitals:   04/02/13 0417  BP: 152/59  Pulse: 68  Temp: 98.2 F (36.8 C)  Resp: 18    Physical Exam: Incisions:  Right groin incision looks better today with dry gauze to groin.  Still slight separation, but not worsened.  Right BK incision is c/d/i.  Extremities:   Right foot is warm Lungs:  Clear to auscultation bilaterally  CBC    Component Value Date/Time   WBC 9.3 03/31/2013 0423   WBC 9.1 02/06/2013 1127   RBC 3.46* 03/31/2013 0423   RBC 3.52* 02/06/2013 1127   HGB 9.7* 03/31/2013 0423   HGB 9.8* 02/06/2013 1127   HCT 30.3* 03/31/2013 0423   HCT 31.3* 02/06/2013 1127   PLT 159 03/31/2013 0423   PLT 178 02/06/2013 1127   MCV 87.6 03/31/2013 0423   MCV 88.9 02/06/2013 1127   MCH 28.0 03/31/2013 0423   MCH 27.8 02/06/2013 1127   MCHC 32.0 03/31/2013 0423   MCHC 31.3* 02/06/2013 1127   RDW 15.8* 03/31/2013 0423   RDW 15.5* 02/06/2013 1127   LYMPHSABS 0.7* 02/06/2013 1127   LYMPHSABS 0.9 10/14/2012 1328   MONOABS 0.6 02/06/2013 1127   MONOABS 0.6 10/14/2012 1328   EOSABS 0.0 02/06/2013 1127   EOSABS 0.1 10/14/2012 1328   BASOSABS 0.0 02/06/2013 1127   BASOSABS 0.0 10/14/2012 1328    BMET    Component Value Date/Time   NA 138 04/01/2013 0925   NA 139 02/06/2013 1127   K 4.1 04/01/2013 0925   K 4.3 02/06/2013 1127   CL 100 04/01/2013 0925   CO2 31 04/01/2013 0925   CO2 31* 02/06/2013 1127   GLUCOSE 151* 04/01/2013 0925   GLUCOSE 152* 02/06/2013 1127   BUN 34* 04/01/2013 0925   BUN 24.1 02/06/2013 1127   CREATININE 1.19 04/01/2013 0925   CREATININE 1.0 02/06/2013 1127   CALCIUM 8.9 04/01/2013 0925   CALCIUM 9.4 02/06/2013 1127   GFRNONAA 58* 04/01/2013 0925   GFRAA 67* 04/01/2013 0925    INR    Component Value Date/Time   INR 1.09 03/18/2013 1509     Intake/Output Summary (Last 24 hours) at  04/02/13 0813 Last data filed at 04/02/13 0015  Gross per 24 hour  Intake    240 ml  Output   2400 ml  Net  -2160 ml   Respiratory Cx Gram Stain: MODERATE WBC PRESENT, PREDOMINANTLY PMN FEW SQUAMOUS EPITHELIAL CELLS PRESENT FEW GRAM POSITIVE COCCI IN PAIRS  ABI's 04/01/13:  RIGHT    LEFT     PRESSURE  WAVEFORM   PRESSURE  WAVEFORM   BRACHIAL  147  Triphasic  BRACHIAL  152  Triphasic   AT  165  Biphasic  AT  >300  Monophasic   PT  124  Biphasic  PT  87  Monophasic     RIGHT  LEFT   ABI  1.09  N/A   Right ABI indicates normal arterial flow with Doppler waveforms within normal limits. Left anterior tibial artery ABI could not be ascertained due to incompressible vessels probably secondary to calcification. ABI of the posterior tibial artery indicates a moderate to severe reduction in arterial flow. Doppler waveforms are abnormal.   Assessment:  77 y.o. male is s/p:  Right common femoral to below knee popliteal artery  bypass graft with 6 mm propatent PTFE, and extensive right common femoral endarterectomy and the distal external iliac artery endarterectomy   5 Days Post-Op  Plan: -BUN up slightly yesterday, but Cr back to 1.19 (1.97 day before may be lab error b/c of the inconsistency of previous labs?)  Will ck BMP this am.  Pt has good UOP -discontinue IVF (already done, I put order in to d/c today) -DVT prophylaxis:  Lovenox -mobilize -pulmonary consulted -awaiting culture for sputum. ? Empiric ABx for d/c? -will decide about ARB and lasix doses for home after labs are back this morning   Doreatha Massed, PA-C [facility] [phone] 04/02/2013 8:13 AM

## 2013-04-02 NOTE — Progress Notes (Signed)
Physical Therapy Treatment Patient Details Name: [name] MRN: [id number] DOB: May 04, 1936 Today's Date: 04/02/2013 Time: 4098-1191 PT Time Calculation (min): 13 min  PT Assessment / Plan / Recommendation  History of Present Illness Pt underwent fem pop bypass graft 03-28-13.  Prior to this procedure he was walking daily for exercise.  He uses continuous O2 at home.   PT Comments   Pt upset about having to practice stairs, however wife insistent that pt practice prior to return to home.  Pt very fatigued after performing steps and needed rest break.  Feel will be safe to D/C to home, however definitely needs f/u from HHPT.    Follow Up Recommendations  Home health PT;Supervision/Assistance - 24 hour     Does the patient have the potential to tolerate intense rehabilitation     Barriers to Discharge        Equipment Recommendations  None recommended by PT    Recommendations for Other Services    Frequency Min 3X/week   Progress towards PT Goals Progress towards PT goals: Progressing toward goals  Plan Current plan remains appropriate    Precautions / Restrictions Precautions Precautions: None Restrictions Weight Bearing Restrictions: No   Pertinent Vitals/Pain "It's fine."    Mobility  Bed Mobility Bed Mobility: Not assessed Transfers Transfers: Sit to Stand;Stand to Sit Sit to Stand: 5: Supervision;With upper extremity assist;From chair/3-in-1 Stand to Sit: 5: Supervision;With upper extremity assist;To chair/3-in-1 Ambulation/Gait Ambulation/Gait Assistance: 4: Min guard Ambulation Distance (Feet): 300 Feet Assistive device: Rolling walker Ambulation/Gait Assistance Details: pt very fatigued after performing stairs and needed standing rest break.   Gait Pattern: Antalgic;Trunk flexed Stairs: Yes Stairs Assistance: 4: Min guard Stair Management Technique: Two rails;Forwards Number of Stairs: 6 Wheelchair Mobility Wheelchair Mobility: No    Exercises     PT  Diagnosis:    PT Problem List:   PT Treatment Interventions:     PT Goals (current goals can now be found in the care plan section) Acute Rehab PT Goals Patient Stated Goal: home PT Goal Formulation: With patient Time For Goal Achievement: 04/05/13 Potential to Achieve Goals: Good  Visit Information  Last PT Received On: 04/02/13 Assistance Needed: +1 History of Present Illness: Pt underwent fem pop bypass graft 03-28-13.  Prior to this procedure he was walking daily for exercise.  He uses continuous O2 at home.    Subjective Data  Subjective: Per pt: I don't need to practice the stairs.  Per wife: You need to practice before you go home.   Patient Stated Goal: home   Cognition  Cognition Arousal/Alertness: Awake/alert Behavior During Therapy: WFL for tasks assessed/performed Overall Cognitive Status: Within Functional Limits for tasks assessed    Balance  Balance Balance Assessed: No  End of Session PT - End of Session Equipment Utilized During Treatment: Gait belt;Oxygen Activity Tolerance: Patient tolerated treatment well Patient left: in chair;with family/visitor present Nurse Communication: Mobility status   GP     [name] , [place] [phone] 04/02/2013, 1:45 PM

## 2013-04-03 LAB — CULTURE, RESPIRATORY

## 2013-04-03 LAB — CULTURE, RESPIRATORY W GRAM STAIN: Culture: NORMAL

## 2013-04-04 ENCOUNTER — Encounter: Payer: Self-pay | Admitting: Pulmonary Disease

## 2013-04-04 ENCOUNTER — Ambulatory Visit (INDEPENDENT_AMBULATORY_CARE_PROVIDER_SITE_OTHER): Payer: Medicare Other | Admitting: Pulmonary Disease

## 2013-04-04 VITALS — BP 144/62 | HR 82 | Temp 97.1°F | Ht 67.0 in | Wt 178.6 lb

## 2013-04-04 DIAGNOSIS — J438 Other emphysema: Secondary | ICD-10-CM

## 2013-04-04 DIAGNOSIS — J439 Emphysema, unspecified: Secondary | ICD-10-CM

## 2013-04-04 NOTE — Assessment & Plan Note (Signed)
The patient is doing very well from a COPD standpoint, I have asked him to continue on his maintenance medications and oxygen.  He is oriented to his flu shot this fall, and I have asked him to continue to be as active as possible.

## 2013-04-04 NOTE — Progress Notes (Signed)
  Subjective:    Patient ID: [name], male    DOB: [birth year], 77 y.o.   MRN: [id number]  HPI The patient comes in today for followup after his recent lower extremity vascular bypass procedure.  He has COPD and cardiomyopathy with chronic respiratory failure.  He did very well with the surgery, but did have some purulent mucus while in the hospital.  His sputum culture showed normal oropharyngeal flora, but he was treated with antibiotics.  He feels that he is doing very well, with no increased shortness of breath or purulent mucus at this time.   Review of Systems  Constitutional: Negative for fever and unexpected weight change.  HENT: Positive for congestion. Negative for ear pain, nosebleeds, sore throat, rhinorrhea, sneezing, trouble swallowing, dental problem, postnasal drip and sinus pressure.   Eyes: Negative for redness and itching.  Respiratory: Positive for cough. Negative for chest tightness, shortness of breath and wheezing.   Cardiovascular: Negative for palpitations and leg swelling.  Gastrointestinal: Negative for nausea and vomiting.  Genitourinary: Negative for dysuria.  Musculoskeletal: Negative for joint swelling.  Skin: Negative for rash.  Neurological: Negative for headaches.  Hematological: Does not bruise/bleed easily.  Psychiatric/Behavioral: Negative for dysphoric mood. The patient is not nervous/anxious.        Objective:   Physical Exam Well-developed male in no acute distress Nose without purulence or discharge noted Neck without lymphadenopathy or thyromegaly Chest with basilar crackles, no wheezing Cardiac exam with regular rate and rhythm Lower extremities with 1+ edema, no cyanosis Alert and oriented, moves all 4 extremities.       Assessment & Plan:

## 2013-04-04 NOTE — Patient Instructions (Addendum)
No change in meds. Ok to cancel upcoming apptm, and reschedule for 4mos.

## 2013-04-07 NOTE — Progress Notes (Signed)
I agree with the above  WElls Demi Trieu 

## 2013-04-07 NOTE — Discharge Summary (Signed)
I agree with the above  [name] 

## 2013-04-11 ENCOUNTER — Encounter: Payer: Self-pay | Admitting: Surgery

## 2013-04-11 ENCOUNTER — Encounter (HOSPITAL_BASED_OUTPATIENT_CLINIC_OR_DEPARTMENT_OTHER): Payer: Medicare Other | Attending: General Surgery

## 2013-04-11 DIAGNOSIS — L97909 Non-pressure chronic ulcer of unspecified part of unspecified lower leg with unspecified severity: Secondary | ICD-10-CM | POA: Insufficient documentation

## 2013-04-11 DIAGNOSIS — I87319 Chronic venous hypertension (idiopathic) with ulcer of unspecified lower extremity: Secondary | ICD-10-CM | POA: Insufficient documentation

## 2013-04-11 DIAGNOSIS — L8992 Pressure ulcer of unspecified site, stage 2: Secondary | ICD-10-CM | POA: Insufficient documentation

## 2013-04-11 DIAGNOSIS — L89109 Pressure ulcer of unspecified part of back, unspecified stage: Secondary | ICD-10-CM | POA: Insufficient documentation

## 2013-04-14 ENCOUNTER — Other Ambulatory Visit: Payer: Self-pay | Admitting: Interventional Cardiology

## 2013-04-14 ENCOUNTER — Encounter: Payer: Medicare Other | Admitting: Surgery

## 2013-04-14 ENCOUNTER — Encounter: Payer: Self-pay | Admitting: Surgery

## 2013-04-14 ENCOUNTER — Ambulatory Visit (INDEPENDENT_AMBULATORY_CARE_PROVIDER_SITE_OTHER): Payer: Medicare Other | Admitting: Surgery

## 2013-04-14 VITALS — BP 141/54 | HR 76 | Temp 98.0°F | Ht 67.0 in | Wt 179.1 lb

## 2013-04-14 DIAGNOSIS — I739 Peripheral vascular disease, unspecified: Secondary | ICD-10-CM

## 2013-04-14 NOTE — Progress Notes (Signed)
The patient is back today for followup.  On 03/28/2013 he underwent right common femoral to below knee popliteal artery bypass graft with 6 mm propatent PTFE.  He also underwent extensive right common femoral endarterectomy and distal external iliac endarterectomy.  His postoperative course was relatively uncomplicated.  He was discharged on Ancef for pneumonia.  He is back today for followup.  He is continuing to get wound care at the wound center.  On examination the wound on the medial side of the right leg appears to be improving.  There has been a slight increase in the depth and size of the wound on the lateral side of the right foot.  No evidence of infection was identified.  There is also slight skin separation in the right groin with a little bit of surrounding erythema.  Overall I feel he is doing well.  I'm groin to place him on Levaquin due to concerns of possible early infection on the incision in the groin and the fact that he has a prosthetic graft in place.  I'll have the patient come back to see me in 2 weeks for repeat evaluation.

## 2013-04-25 ENCOUNTER — Encounter: Payer: Self-pay | Admitting: Surgery

## 2013-04-28 ENCOUNTER — Encounter: Payer: Self-pay | Admitting: Surgery

## 2013-04-28 ENCOUNTER — Ambulatory Visit (INDEPENDENT_AMBULATORY_CARE_PROVIDER_SITE_OTHER): Payer: Medicare Other | Admitting: Surgery

## 2013-04-28 VITALS — BP 141/59 | HR 78 | Temp 98.1°F | Ht 67.0 in | Wt 178.0 lb

## 2013-04-28 DIAGNOSIS — I739 Peripheral vascular disease, unspecified: Secondary | ICD-10-CM

## 2013-04-28 NOTE — Progress Notes (Signed)
The patient is back today for followup. On 03/28/2013 he underwent right common femoral to below knee popliteal artery bypass graft with 6 mm propatent PTFE. He also underwent extensive right common femoral endarterectomy and distal external iliac endarterectomy. His postoperative course was relatively uncomplicated. He was discharged on Ancef for pneumonia.  When I saw 2 weeks ago I felt that his wounds have gotten worse.  I placed him on Levaquin.  I was also concerned about a separation in his right groin.  The right groin incision is healing.  This was better.  The below knee incision is completely healed.  He does have no significant change within the ulcers.  He has a heavy biofilm on the lateral ulcer.  There also appeared to be new areas developing on the anterior portion of the right leg.  The patient does have a significant amount of edema in the right leg which I think is contributing to his nonhealing ulcers.  I feel that I needed to swelling of the leg.  I am placing him in a [employer] today.  He will come back on Wednesday to have this changed.  He would then be evaluated in the wound center again on Friday.  I told her that I recommend culture of the wound.  He also likely need more debridement and antibiotic therapy to assist with getting rid of the eyelid found.  Ultimately he will likely need a skin substitute skin graft.  He will followup with me in 2 weeks.

## 2013-04-30 ENCOUNTER — Ambulatory Visit (INDEPENDENT_AMBULATORY_CARE_PROVIDER_SITE_OTHER): Payer: Medicare Other | Admitting: *Deleted

## 2013-04-30 DIAGNOSIS — I739 Peripheral vascular disease, unspecified: Secondary | ICD-10-CM

## 2013-04-30 NOTE — Progress Notes (Signed)
Removed Unna Boot from right leg.  Right calf and ankle is noticeably less swollen than on Monday 04-28-2013.  Noted small amount of drainage on [employer].  No odor noted.  Cleaned right foot, ankle, and calf with Carraklenz.  Photos taken.  Measurements of 3 ulcers on right calf taken.  [employer] reapplied.  [name] has appointment at [facility] Friday, 05-02-2013.  [name] has follow up appointment with Dr. Myra Gianotti on 05-12-2013.

## 2013-05-02 ENCOUNTER — Other Ambulatory Visit: Payer: Medicare Other | Admitting: Lab

## 2013-05-02 ENCOUNTER — Ambulatory Visit: Payer: Medicare Other | Admitting: Oncology

## 2013-05-05 ENCOUNTER — Ambulatory Visit (HOSPITAL_BASED_OUTPATIENT_CLINIC_OR_DEPARTMENT_OTHER): Payer: Medicare Other | Admitting: Oncology

## 2013-05-05 ENCOUNTER — Other Ambulatory Visit (HOSPITAL_BASED_OUTPATIENT_CLINIC_OR_DEPARTMENT_OTHER): Payer: Medicare Other | Admitting: Lab

## 2013-05-05 VITALS — BP 156/50 | HR 87 | Temp 97.8°F | Resp 20 | Ht 67.0 in | Wt 179.6 lb

## 2013-05-05 DIAGNOSIS — R599 Enlarged lymph nodes, unspecified: Secondary | ICD-10-CM

## 2013-05-05 DIAGNOSIS — D649 Anemia, unspecified: Secondary | ICD-10-CM

## 2013-05-05 DIAGNOSIS — L97909 Non-pressure chronic ulcer of unspecified part of unspecified lower leg with unspecified severity: Secondary | ICD-10-CM

## 2013-05-05 DIAGNOSIS — D472 Monoclonal gammopathy: Secondary | ICD-10-CM

## 2013-05-05 LAB — CBC WITH DIFFERENTIAL/PLATELET
BASO%: 0.3 % (ref 0.0–2.0)
Basophils Absolute: 0 10*3/uL (ref 0.0–0.1)
EOS%: 0.6 % (ref 0.0–7.0)
HCT: 31.2 % — ABNORMAL LOW (ref 38.4–49.9)
HGB: 10.1 g/dL — ABNORMAL LOW (ref 13.0–17.1)
MCH: 28.4 pg (ref 27.2–33.4)
MCV: 87.7 fL (ref 79.3–98.0)
MONO%: 9.9 % (ref 0.0–14.0)
NEUT%: 80 % — ABNORMAL HIGH (ref 39.0–75.0)
Platelets: 197 10*3/uL (ref 140–400)
RBC: 3.55 10*6/uL — ABNORMAL LOW (ref 4.20–5.82)

## 2013-05-05 NOTE — Progress Notes (Signed)
   [facility]    OFFICE PROGRESS NOTE   INTERVAL HISTORY:   [name] returns for scheduled followup of anemia. No specific complaint. He underwent a right femoral bypass procedure on 03/28/2013. He reports right lower extremity ulcers are healing. He developed sacral decubiti while in the hospital. He underwent a CT of the abdomen on 01/08/2013 to follow up on an indeterminate left renal lesion noted on a previous CT. The dominant left kidney lesion was compatible with a cyst. Other smaller bilateral renal lesions could not be fully characterized.  Objective:  Vital signs in last 24 hours:  Blood pressure 156/50, pulse 87, temperature 97.8 F (36.6 C), temperature source Oral, resp. rate 20, height 5\' 7"  (1.702 m), weight 179 lb 9.6 oz (81.466 kg).    HEENT: Neck without mass Lymphatics: No cervical, supraclavicular, or axillary nodes Resp: Distant breath sounds, no respiratory distress Cardio: Regular rate and rhythm GI: No hepatosplenomegaly Vascular: Bandages wrapping both lower legs.  Skin: Dressings in place over sacral decubiti     Lab Results:  Lab Results  Component Value Date   WBC 9.7 05/05/2013   HGB 10.1* 05/05/2013   HCT 31.2* 05/05/2013   MCV 87.7 05/05/2013   PLT 197 05/05/2013   ANC 7.8    Medications: I have reviewed the patient's current medications.  Assessment/Plan: 1. Normocytic anemia-bone marrow biopsy on 12/24/2012 with a slightly hypercellular marrow and no evidence of a lymphoproliferative disorder, negative myeloma fish panel. The hemoglobin is stable.  2. Serum monoclonal IgM kappa protein  3. Low IgG and IgA levels  4. Oxygen dependent COPD  5. History of coronary artery disease  6. History of congestive heart failure  7. Peripheral vascular disease, status post femoral artery bypass surgery September 2014  8. Defibrillator  9. Colon cancer 2005-stage II (T3 N0)  10. CT of the abdomen 08/30/2009-upper pole left kidney  lesion measuring 12 x 12 mm-indeterminate, increased from 2008 and 2010, felt to be a cyst on the CT 01/08/2013 , consistent with a cyst on a CT 01/08/2013 with other indeterminate small renal lesions 11. Small abdominal lymph nodes on the CT 01/08/2013-not significant change compared to a CT from 08/30/2009   Disposition:  [name] is stable from a hematologic standpoint. The anemia may be related to "chronic disease "in the setting of nonhealing ulcers. The serum IgM monoclonal protein and small abdominal lymph nodes may indicate an early lymphoproliferative disorder. He will return for a CBC and IgM level in 4 months.   Thornton Papas, MD  05/05/2013  4:23 PM

## 2013-05-06 ENCOUNTER — Telehealth: Payer: Self-pay | Admitting: Oncology

## 2013-05-06 NOTE — Telephone Encounter (Signed)
S/w the pt and he is aware of his march 2015 appts. °

## 2013-05-09 ENCOUNTER — Encounter (HOSPITAL_BASED_OUTPATIENT_CLINIC_OR_DEPARTMENT_OTHER): Payer: Medicare Other | Attending: General Surgery

## 2013-05-09 ENCOUNTER — Encounter: Payer: Self-pay | Admitting: Surgery

## 2013-05-09 DIAGNOSIS — L8993 Pressure ulcer of unspecified site, stage 3: Secondary | ICD-10-CM | POA: Insufficient documentation

## 2013-05-09 DIAGNOSIS — L97809 Non-pressure chronic ulcer of other part of unspecified lower leg with unspecified severity: Secondary | ICD-10-CM | POA: Insufficient documentation

## 2013-05-09 DIAGNOSIS — L89109 Pressure ulcer of unspecified part of back, unspecified stage: Secondary | ICD-10-CM | POA: Insufficient documentation

## 2013-05-12 ENCOUNTER — Encounter: Payer: Self-pay | Admitting: Surgery

## 2013-05-12 ENCOUNTER — Ambulatory Visit (INDEPENDENT_AMBULATORY_CARE_PROVIDER_SITE_OTHER): Payer: Medicare Other | Admitting: Surgery

## 2013-05-12 ENCOUNTER — Ambulatory Visit (INDEPENDENT_AMBULATORY_CARE_PROVIDER_SITE_OTHER): Payer: Medicare Other | Admitting: *Deleted

## 2013-05-12 ENCOUNTER — Encounter: Payer: Self-pay | Admitting: Internal Medicine

## 2013-05-12 VITALS — BP 156/62 | HR 79 | Ht 67.0 in | Wt 178.0 lb

## 2013-05-12 DIAGNOSIS — I2589 Other forms of chronic ischemic heart disease: Secondary | ICD-10-CM

## 2013-05-12 DIAGNOSIS — I739 Peripheral vascular disease, unspecified: Secondary | ICD-10-CM

## 2013-05-12 DIAGNOSIS — Z95810 Presence of automatic (implantable) cardiac defibrillator: Secondary | ICD-10-CM

## 2013-05-12 DIAGNOSIS — I5022 Chronic systolic (congestive) heart failure: Secondary | ICD-10-CM

## 2013-05-12 NOTE — Progress Notes (Signed)
The patient is back today for a wound check.  He is status post right femoral to below knee popliteal artery bypass graft with Gore-Tex.  This was performed on 03/28/2013 for nonhealing ulcers.  When I saw the patient last time I felt that the ulcers were getting worse and it was secondary to edema.  He was placed in boots.  And referred to the wound center.  He has been undergoing weekly with the exchanges.  Today the wounds appear to be superficial and smaller.  He does have beefy-red granulation tissue.  There may be a small amount of biofilm.  I recommended to the patient that these continue to be scraped at the wound center.  I would consider culturing the wound and treating him with antibiotics if these cultures come back positive, given the appearance of a biofilm on top of the wound.  A schedule him to come back to see me in one month with a duplex of his bypass.

## 2013-05-13 NOTE — Addendum Note (Signed)
Addended by: Sharee Pimple on: 05/13/2013 08:40 AM   Modules accepted: Orders

## 2013-05-16 ENCOUNTER — Telehealth: Payer: Self-pay | Admitting: Internal Medicine

## 2013-05-16 NOTE — Telephone Encounter (Signed)
New Problem  Pt has a ringing in his ears from the device// He states that this happens very often// requests a call back to discuss.

## 2013-05-16 NOTE — Telephone Encounter (Signed)
Per pt call - states he has had ringing in his ears this week since doing his remote device check on Monday.  States it has happened 4 times since Monday and twice today.  He is very concerned as this seems to him is what occurred before when something was wrong with his lead and it had to be changed out.  [name] reviewed report from Monday and states it looks "just fine"  Advised pt I will forward this information to Dr [name] for review but if he becomes increasingly concerned before he hears back he should call the MD or call or go to the ED over the weekend for evaluation.  He states understanding.

## 2013-05-18 LAB — MDC_IDC_ENUM_SESS_TYPE_REMOTE
Battery Remaining Longevity: 95 mo
Battery Voltage: 3 V
Brady Statistic AP VP Percent: 0.57 %
Brady Statistic AP VS Percent: 0.03 %
Brady Statistic AS VS Percent: 22.97 %
HighPow Impedance: 50 Ohm
Lead Channel Impedance Value: 4047 Ohm
Lead Channel Impedance Value: 456 Ohm
Lead Channel Pacing Threshold Amplitude: 0.75 V
Lead Channel Pacing Threshold Amplitude: 0.75 V
Lead Channel Pacing Threshold Pulse Width: 0.4 ms
Lead Channel Pacing Threshold Pulse Width: 0.4 ms
Lead Channel Sensing Intrinsic Amplitude: 0.5 mV
Lead Channel Sensing Intrinsic Amplitude: 12.375 mV
Lead Channel Setting Pacing Amplitude: 2 V
Lead Channel Setting Pacing Amplitude: 2.5 V
Lead Channel Setting Pacing Pulse Width: 0.4 ms
Lead Channel Setting Pacing Pulse Width: 0.4 ms
Lead Channel Setting Sensing Sensitivity: 0.3 mV
Zone Setting Detection Interval: 340 ms
Zone Setting Detection Interval: 430 ms

## 2013-05-21 ENCOUNTER — Encounter: Payer: Self-pay | Admitting: *Deleted

## 2013-05-28 ENCOUNTER — Other Ambulatory Visit: Payer: Self-pay | Admitting: Pulmonary Disease

## 2013-06-06 ENCOUNTER — Encounter (HOSPITAL_BASED_OUTPATIENT_CLINIC_OR_DEPARTMENT_OTHER): Payer: Medicare Other | Attending: General Surgery

## 2013-06-06 DIAGNOSIS — I999 Unspecified disorder of circulatory system: Secondary | ICD-10-CM | POA: Insufficient documentation

## 2013-06-06 DIAGNOSIS — L97809 Non-pressure chronic ulcer of other part of unspecified lower leg with unspecified severity: Secondary | ICD-10-CM | POA: Insufficient documentation

## 2013-06-06 DIAGNOSIS — L8993 Pressure ulcer of unspecified site, stage 3: Secondary | ICD-10-CM | POA: Insufficient documentation

## 2013-06-06 DIAGNOSIS — L89309 Pressure ulcer of unspecified buttock, unspecified stage: Secondary | ICD-10-CM | POA: Insufficient documentation

## 2013-06-13 ENCOUNTER — Encounter: Payer: Self-pay | Admitting: Surgery

## 2013-06-16 ENCOUNTER — Ambulatory Visit (INDEPENDENT_AMBULATORY_CARE_PROVIDER_SITE_OTHER): Payer: Medicare Other | Admitting: Surgery

## 2013-06-16 ENCOUNTER — Ambulatory Visit (INDEPENDENT_AMBULATORY_CARE_PROVIDER_SITE_OTHER)
Admission: RE | Admit: 2013-06-16 | Discharge: 2013-06-16 | Disposition: A | Discharge status: Home / Self Care | Payer: Medicare Other | Source: Ambulatory Visit | Attending: Surgery | Admitting: Surgery

## 2013-06-16 ENCOUNTER — Ambulatory Visit: Payer: Medicare Other | Admitting: Pulmonary Disease

## 2013-06-16 ENCOUNTER — Other Ambulatory Visit: Payer: Self-pay | Admitting: Surgery

## 2013-06-16 ENCOUNTER — Encounter: Payer: Self-pay | Admitting: Surgery

## 2013-06-16 ENCOUNTER — Ambulatory Visit (HOSPITAL_COMMUNITY)
Admission: RE | Admit: 2013-06-16 | Discharge: 2013-06-16 | Disposition: A | Discharge status: Home / Self Care | Payer: Medicare Other | Source: Ambulatory Visit | Attending: Surgery | Admitting: Surgery

## 2013-06-16 VITALS — BP 153/67 | HR 77 | Ht 67.0 in | Wt 179.0 lb

## 2013-06-16 DIAGNOSIS — I739 Peripheral vascular disease, unspecified: Secondary | ICD-10-CM

## 2013-06-16 DIAGNOSIS — L98499 Non-pressure chronic ulcer of skin of other sites with unspecified severity: Secondary | ICD-10-CM | POA: Insufficient documentation

## 2013-06-16 NOTE — Progress Notes (Addendum)
Patient name: [name] MRN: [id number] DOB: February 22, 1936 Sex: male     Chief Complaint  Patient presents with  . Re-evaluation    1 month f/u - unna boot R LE, now has unna boot on R LE - c/o     HISTORY OF PRESENT ILLNESS: The patient is back today for a wound check. He is status post right femoral to below knee popliteal artery bypass graft with Gore-Tex. This was performed on 03/28/2013 for nonhealing ulcers.  I had originally placed the patient in a Unna boot bilaterally and sent into the wound center.  He is back for followup.  He has had a significant decline in the wound on the right leg.  He is now back in a Radio broadcast assistant.  He complains of significant pain which keeps him up at night.   Past Medical History  Diagnosis Date  . Hypokalemia   . Colon cancer   . PVD (peripheral vascular disease)   . HTN (hypertension)   . Automatic implantable cardiac defibrillator in situ   . CHF (congestive heart failure)   . Ventricular tachycardia   . Ischemic heart disease, chronic   . Dyslipidemia   . CAD (coronary artery disease)   . COPD (chronic obstructive pulmonary disease)   . Emphysema   . On home oxygen therapy   . Pacemaker   . Self-catheterizes urinary bladder     since back surgery.  6- 7 times a day.  . Difficult intubation     pt states "not aware of difficult intubation, never told"  . Heart murmur     dx 1974  . Stroke     TIA- 1980's  . Shortness of breath   . Diabetes mellitus without complication     boderline  . Head injury, closed, with brief LOC 2013  . Anemia   . Neurogenic bladder     since back surgery - self caths  . Neurogenic bowel     digitally stimulates daily.    Past Surgical History  Procedure Laterality Date  . Lumbar back surgery      x 3  fusion- lumbar  . Iliac artery stent      left  . Implantation of medtronic concerto bi-v icd  04-10-08    [name] MD  . Colonoscopy    . Polypectomy    . Insertion of iliac stent   06/04/2012    right external   . Cardiac catheterization    . Insert / replace / remove pacemaker    . Endarterectomy femoral  06/28/2012    Procedure: ENDARTERECTOMY FEMORAL;  Surgeon: Nada Libman, MD;  Location: [facility] OR;  Service: Vascular;  Laterality: Left;  . Patch angioplasty  06/28/2012    Procedure: PATCH ANGIOPLASTY;  Surgeon: Nada Libman, MD;  Location: [facility] OR;  Service: Vascular;  Laterality: Left;  Using 0.8 cm x 15.2 cm Hemashield Patch.  . Colon surgery  03/2004  . Eye surgery Bilateral     cataracts  . Cholecystectomy    . Coronary artery bypass graft  1998    x3 vessels  . Femoral-popliteal bypass graft Right 03/28/2013    Procedure: BYPASS GRAFT FEMORAL-BELOW KNEE POPLITEAL ARTERY Using 6mm x 80 cm Propaten Graft ,With Right Common Femoral Artery Endarterectomy.;  Surgeon: Nada Libman, MD;  Location: [facility] OR;  Service: Vascular;  Laterality: Right;    History   Social History  . Marital Status: Married  Spouse Name: N/A    Number of Children: N/A  . Years of Education: N/A   Occupational History  .      Retired Company secretary   Social History Main Topics  . Smoking status: Former Smoker -- 3.00 packs/day for 48 years    Types: Cigarettes    Quit date: 07/03/2002  . Smokeless tobacco: Former Neurosurgeon    Quit date: 07/11/2002  . Alcohol Use: Yes     Comment: rarely  . Drug Use: No  . Sexual Activity: Not on file   Other Topics Concern  . Not on file   Social History Narrative   Married, wife [name]   Oxygen-continuous   I/O self cath since back surgery    Family History  Problem Relation Age of Onset  . Heart failure Father   . Heart disease Mother     before age 36  . Hyperlipidemia Mother   . Hypertension Mother     Allergies as of 06/16/2013  . (No Known Allergies)    Current Outpatient Prescriptions on File Prior to Visit  Medication Sig Dispense Refill  . albuterol (PROVENTIL HFA;VENTOLIN HFA) 108 (90 BASE) MCG/ACT  inhaler Inhale 2 puffs into the lungs as needed. Inhale 1-2 puffs into the lungs every 6 (six) hours as needed. Shortness of breath      . albuterol (PROVENTIL) (2.5 MG/3ML) 0.083% nebulizer solution Take 2.5 mg by nebulization 4 (four) times daily.       Marland Kitchen amLODipine (NORVASC) 5 MG tablet Take 5 mg by mouth every morning.       Marland Kitchen aspirin 325 MG tablet Take 325 mg by mouth daily.      . budesonide (PULMICORT) 0.5 MG/2ML nebulizer solution Take 0.5 mg by nebulization 2 (two) times daily.       . candesartan (ATACAND) 32 MG tablet Take 32 mg by mouth daily.       . carvedilol (COREG) 25 MG tablet Take 25 mg by mouth 2 (two) times daily with a meal.      . collagenase (SANTYL) ointment Apply 1 application topically daily.      . ferrous sulfate 325 (65 FE) MG tablet Take 325 mg by mouth at bedtime.       . furosemide (LASIX) 40 MG tablet Take 40-80 mg by mouth 2 (two) times daily. Take 2 tablets in the morning and 1 tablet in the evening      . guaiFENesin (MUCINEX) 600 MG 12 hr tablet Take 600 mg by mouth 2 (two) times daily as needed. congestion      . HYDROcodone-acetaminophen (VICODIN) 2.5-500 MG per tablet Take 1 tablet by mouth every 6 (six) hours as needed. Back pain  30 tablet  0  . LANOXIN 125 MCG tablet TAKE 1 TABLET DAILY  90 tablet  2  . nitroGLYCERIN (NITRODUR - DOSED IN MG/24 HR) 0.2 mg/hr patch Place 1 patch onto the skin daily.      . nitroGLYCERIN (NITROSTAT) 0.4 MG SL tablet Place 0.4 mg under the tongue every 5 (five) minutes as needed for chest pain.      . potassium chloride SA (K-DUR,KLOR-CON) 20 MEQ tablet Take 1 tablet (20 mEq total) by mouth daily.  90 tablet  3  . predniSONE (DELTASONE) 10 MG tablet Take 10 mg by mouth daily.      Marland Kitchen PROVENTIL HFA 108 (90 BASE) MCG/ACT inhaler INHALE 1 TO 2 PUFFS INTO THE LUNGS EVERY 6 HOURS AS  NEEDED FOR SHORTNESS OF BREATH  3 each  2  . simvastatin (ZOCOR) 20 MG tablet Take 20 mg by mouth at bedtime.       Marland Kitchen tiotropium (SPIRIVA) 18 MCG  inhalation capsule Place 18 mcg into inhaler and inhale daily.        No current facility-administered medications on file prior to visit.     REVIEW OF SYSTEMS: Please see history of present illness, otherwise all systems are negative  PHYSICAL EXAMINATION:   Vital signs are BP 153/67  Pulse 77  Ht 5\' 7"  (1.702 m)  Wt 179 lb (81.194 kg)  BMI 28.03 kg/m2  SpO2 99% General: The patient appears their stated age. HEENT:  No gross abnormalities Pulmonary:  Non labored breathing Musculoskeletal: There are no major deformities. Neurologic: No focal weakness or paresthesias are detected, Skin: Desquamation of right calf.  There is also a small ulcer on the lateral left leg. Psychiatric: The patient has normal affect. Cardiovascular: There is a regular rate and rhythm without significant murmur appreciated.   Diagnostic Studies I have reviewed his ultrasound today.  This shows monophasic waveforms throughout his bypass graft.  No stenosis is identified within his bypass  Assessment: Bilateral lower extremity ulcers Plan: The patient's bypass graft is widely patent without any problems.  However, ultrasound did identify monophasic waveforms in the right leg.  This suggests that there may be a component of aortoiliac disease.  The patient has a history of a right external iliac stent.  I told the patient that I think this needs to be evaluated so that it doesn't compromise his right leg bypass graft.  I feel the best way to do this is angiography.  I have scheduled him for an aortogram with bifemoral runoff with possible intervention to the right aortoiliac segment on Wednesday, December 31.  L. plan on cannulation of the left femoral artery, aortogram with bilateral runoff and possible intervention.  Risks and a were discussed with the patient and his wife.  The patient may benefit from a venous reflux evaluation at a later date  V. Charlena Cross, M.D. [facility]  [place] Office: [phone] Pager:  [phone]

## 2013-06-17 ENCOUNTER — Other Ambulatory Visit: Payer: Self-pay | Admitting: *Deleted

## 2013-06-17 DIAGNOSIS — Z01818 Encounter for other preprocedural examination: Secondary | ICD-10-CM

## 2013-06-18 ENCOUNTER — Encounter (HOSPITAL_COMMUNITY): Payer: Self-pay | Admitting: Pharmacy Technician

## 2013-07-02 ENCOUNTER — Telehealth: Payer: Self-pay | Admitting: Surgery

## 2013-07-02 ENCOUNTER — Encounter (HOSPITAL_COMMUNITY)
Admission: RE | Disposition: A | Discharge status: Home / Self Care | Payer: Self-pay | Source: Ambulatory Visit | Attending: Surgery

## 2013-07-02 ENCOUNTER — Ambulatory Visit (HOSPITAL_COMMUNITY)
Admission: RE | Admit: 2013-07-02 | Discharge: 2013-07-02 | Disposition: A | Discharge status: Home / Self Care | Payer: Medicare Other | Source: Ambulatory Visit | Attending: Surgery | Admitting: Surgery

## 2013-07-02 ENCOUNTER — Other Ambulatory Visit: Payer: Self-pay | Admitting: *Deleted

## 2013-07-02 DIAGNOSIS — Z48812 Encounter for surgical aftercare following surgery on the circulatory system: Secondary | ICD-10-CM

## 2013-07-02 DIAGNOSIS — Z85038 Personal history of other malignant neoplasm of large intestine: Secondary | ICD-10-CM | POA: Insufficient documentation

## 2013-07-02 DIAGNOSIS — K592 Neurogenic bowel, not elsewhere classified: Secondary | ICD-10-CM | POA: Insufficient documentation

## 2013-07-02 DIAGNOSIS — I509 Heart failure, unspecified: Secondary | ICD-10-CM | POA: Insufficient documentation

## 2013-07-02 DIAGNOSIS — Z95810 Presence of automatic (implantable) cardiac defibrillator: Secondary | ICD-10-CM | POA: Insufficient documentation

## 2013-07-02 DIAGNOSIS — J438 Other emphysema: Secondary | ICD-10-CM | POA: Insufficient documentation

## 2013-07-02 DIAGNOSIS — L98499 Non-pressure chronic ulcer of skin of other sites with unspecified severity: Secondary | ICD-10-CM

## 2013-07-02 DIAGNOSIS — L97909 Non-pressure chronic ulcer of unspecified part of unspecified lower leg with unspecified severity: Secondary | ICD-10-CM | POA: Insufficient documentation

## 2013-07-02 DIAGNOSIS — N319 Neuromuscular dysfunction of bladder, unspecified: Secondary | ICD-10-CM | POA: Insufficient documentation

## 2013-07-02 DIAGNOSIS — Z7982 Long term (current) use of aspirin: Secondary | ICD-10-CM | POA: Insufficient documentation

## 2013-07-02 DIAGNOSIS — E785 Hyperlipidemia, unspecified: Secondary | ICD-10-CM | POA: Insufficient documentation

## 2013-07-02 DIAGNOSIS — I10 Essential (primary) hypertension: Secondary | ICD-10-CM | POA: Insufficient documentation

## 2013-07-02 DIAGNOSIS — Z8673 Personal history of transient ischemic attack (TIA), and cerebral infarction without residual deficits: Secondary | ICD-10-CM | POA: Insufficient documentation

## 2013-07-02 DIAGNOSIS — I472 Ventricular tachycardia, unspecified: Secondary | ICD-10-CM | POA: Insufficient documentation

## 2013-07-02 DIAGNOSIS — I259 Chronic ischemic heart disease, unspecified: Secondary | ICD-10-CM | POA: Insufficient documentation

## 2013-07-02 DIAGNOSIS — E119 Type 2 diabetes mellitus without complications: Secondary | ICD-10-CM | POA: Insufficient documentation

## 2013-07-02 DIAGNOSIS — I2510 Atherosclerotic heart disease of native coronary artery without angina pectoris: Secondary | ICD-10-CM | POA: Insufficient documentation

## 2013-07-02 DIAGNOSIS — I4729 Other ventricular tachycardia: Secondary | ICD-10-CM | POA: Insufficient documentation

## 2013-07-02 DIAGNOSIS — I739 Peripheral vascular disease, unspecified: Secondary | ICD-10-CM

## 2013-07-02 DIAGNOSIS — Z87891 Personal history of nicotine dependence: Secondary | ICD-10-CM | POA: Insufficient documentation

## 2013-07-02 DIAGNOSIS — I708 Atherosclerosis of other arteries: Secondary | ICD-10-CM | POA: Insufficient documentation

## 2013-07-02 HISTORY — PX: LOWER EXTREMITY ANGIOGRAM: SHX5508

## 2013-07-02 HISTORY — PX: ABDOMINAL AORTAGRAM: SHX5454

## 2013-07-02 HISTORY — PX: PERCUTANEOUS STENT INTERVENTION: SHX5500

## 2013-07-02 LAB — POCT I-STAT, CHEM 8
BUN: 24 mg/dL — ABNORMAL HIGH (ref 6–23)
Chloride: 99 mEq/L (ref 96–112)
Creatinine, Ser: 1.2 mg/dL (ref 0.50–1.35)
HCT: 31 % — ABNORMAL LOW (ref 39.0–52.0)
Potassium: 4.5 mEq/L (ref 3.7–5.3)
Sodium: 137 mEq/L (ref 137–147)

## 2013-07-02 LAB — POCT ACTIVATED CLOTTING TIME
Activated Clotting Time: 182 seconds
Activated Clotting Time: 166 seconds

## 2013-07-02 LAB — GLUCOSE, CAPILLARY
Glucose-Capillary: 165 mg/dL — ABNORMAL HIGH (ref 70–99)
Glucose-Capillary: 180 mg/dL — ABNORMAL HIGH (ref 70–99)

## 2013-07-02 SURGERY — ABDOMINAL AORTAGRAM
Anesthesia: LOCAL | Laterality: Right

## 2013-07-02 MED ORDER — FENTANYL CITRATE 0.05 MG/ML IJ SOLN
INTRAMUSCULAR | Status: AC
  Filled 2013-07-02: qty 2

## 2013-07-02 MED ORDER — HEPARIN SODIUM (PORCINE) 1000 UNIT/ML IJ SOLN
INTRAMUSCULAR | Status: AC
  Filled 2013-07-02: qty 1

## 2013-07-02 MED ORDER — LIDOCAINE HCL (PF) 1 % IJ SOLN
INTRAMUSCULAR | Status: AC
  Filled 2013-07-02: qty 30

## 2013-07-02 MED ORDER — SODIUM CHLORIDE 0.9 % IV SOLN: 1.0000 mL/kg/h | INTRAVENOUS | Status: DC

## 2013-07-02 MED ORDER — MORPHINE SULFATE 2 MG/ML IJ SOLN
INTRAMUSCULAR | Status: AC
  Filled 2013-07-02: qty 1

## 2013-07-02 MED ORDER — OXYCODONE-ACETAMINOPHEN 5-325 MG PO TABS
1.0000 | ORAL_TABLET | ORAL | Status: DC | PRN
  Administered 2013-07-02: 1 via ORAL

## 2013-07-02 MED ORDER — MORPHINE SULFATE 10 MG/ML IJ SOLN
2.0000 mg | INTRAMUSCULAR | Status: DC | PRN
  Administered 2013-07-02: 2 mg via INTRAVENOUS

## 2013-07-02 MED ORDER — OXYCODONE-ACETAMINOPHEN 5-325 MG PO TABS
ORAL_TABLET | ORAL | Status: AC
  Filled 2013-07-02: qty 1

## 2013-07-02 MED ORDER — SODIUM CHLORIDE 0.9 % IV SOLN
INTRAVENOUS | Status: DC
  Administered 2013-07-02: 1000 mL via INTRAVENOUS

## 2013-07-02 MED ORDER — MIDAZOLAM HCL 2 MG/2ML IJ SOLN
INTRAMUSCULAR | Status: AC
  Filled 2013-07-02: qty 2

## 2013-07-02 MED ORDER — HEPARIN (PORCINE) IN NACL 2-0.9 UNIT/ML-% IJ SOLN
INTRAMUSCULAR | Status: AC
  Filled 2013-07-02: qty 1000

## 2013-07-02 SURGICAL SUPPLY — 55 items
ADH SKN CLS APL DERMABOND .7 (GAUZE/BANDAGES/DRESSINGS) ×3
BANDAGE ELASTIC 4 VELCRO ST LF (GAUZE/BANDAGES/DRESSINGS) IMPLANT
BANDAGE ESMARK 6X9 LF (GAUZE/BANDAGES/DRESSINGS) IMPLANT
BNDG CMPR 9X6 STRL LF SNTH (GAUZE/BANDAGES/DRESSINGS)
BNDG ESMARK 6X9 LF (GAUZE/BANDAGES/DRESSINGS)
CANISTER SUCTION 2500CC (MISCELLANEOUS) ×4 IMPLANT
CLIP TI MEDIUM 24 (CLIP) ×4 IMPLANT
CLIP TI WIDE RED SMALL 24 (CLIP) ×4 IMPLANT
COVER SURGICAL LIGHT HANDLE (MISCELLANEOUS) ×4 IMPLANT
CUFF TOURNIQUET SINGLE 24IN (TOURNIQUET CUFF) IMPLANT
CUFF TOURNIQUET SINGLE 34IN LL (TOURNIQUET CUFF) IMPLANT
CUFF TOURNIQUET SINGLE 44IN (TOURNIQUET CUFF) IMPLANT
DERMABOND ADVANCED (GAUZE/BANDAGES/DRESSINGS) ×1
DERMABOND ADVANCED .7 DNX12 (GAUZE/BANDAGES/DRESSINGS) ×3 IMPLANT
DRAIN CHANNEL 15F RND FF W/TCR (WOUND CARE) IMPLANT
DRAPE WARM FLUID 44X44 (DRAPE) ×4 IMPLANT
DRAPE X-RAY CASS 24X20 (DRAPES) IMPLANT
DRSG COVADERM 4X10 (GAUZE/BANDAGES/DRESSINGS) IMPLANT
DRSG COVADERM 4X8 (GAUZE/BANDAGES/DRESSINGS) IMPLANT
ELECT REM PT RETURN 9FT ADLT (ELECTROSURGICAL) ×4
ELECTRODE REM PT RTRN 9FT ADLT (ELECTROSURGICAL) ×3 IMPLANT
EVACUATOR SILICONE 100CC (DRAIN) IMPLANT
GLOVE BIOGEL PI IND STRL 7.5 (GLOVE) ×3 IMPLANT
GLOVE BIOGEL PI INDICATOR 7.5 (GLOVE) ×1
GLOVE SURG SS PI 7.5 STRL IVOR (GLOVE) ×4 IMPLANT
GOWN PREVENTION PLUS XXLARGE (GOWN DISPOSABLE) ×4 IMPLANT
GOWN STRL NON-REIN LRG LVL3 (GOWN DISPOSABLE) ×12 IMPLANT
HEMOSTAT SNOW SURGICEL 2X4 (HEMOSTASIS) IMPLANT
KIT BASIN OR (CUSTOM PROCEDURE TRAY) ×4 IMPLANT
KIT ROOM TURNOVER OR (KITS) ×4 IMPLANT
MARKER GRAFT CORONARY BYPASS (MISCELLANEOUS) IMPLANT
NS IRRIG 1000ML POUR BTL (IV SOLUTION) ×8 IMPLANT
PACK PERIPHERAL VASCULAR (CUSTOM PROCEDURE TRAY) ×4 IMPLANT
PAD ARMBOARD 7.5X6 YLW CONV (MISCELLANEOUS) ×8 IMPLANT
PADDING CAST COTTON 6X4 STRL (CAST SUPPLIES) IMPLANT
SET COLLECT BLD 21X3/4 12 (NEEDLE) IMPLANT
STOPCOCK 4 WAY LG BORE MALE ST (IV SETS) IMPLANT
SUT ETHILON 3 0 PS 1 (SUTURE) IMPLANT
SUT PROLENE 5 0 C 1 24 (SUTURE) ×4 IMPLANT
SUT PROLENE 6 0 BV (SUTURE) ×4 IMPLANT
SUT PROLENE 7 0 BV 1 (SUTURE) IMPLANT
SUT SILK 2 0 SH (SUTURE) ×4 IMPLANT
SUT SILK 3 0 (SUTURE)
SUT SILK 3-0 18XBRD TIE 12 (SUTURE) IMPLANT
SUT VIC AB 2-0 CT1 27 (SUTURE) ×8
SUT VIC AB 2-0 CT1 TAPERPNT 27 (SUTURE) ×6 IMPLANT
SUT VIC AB 3-0 SH 27 (SUTURE) ×8
SUT VIC AB 3-0 SH 27X BRD (SUTURE) ×6 IMPLANT
SUT VICRYL 4-0 PS2 18IN ABS (SUTURE) ×8 IMPLANT
TOWEL OR 17X24 6PK STRL BLUE (TOWEL DISPOSABLE) ×8 IMPLANT
TOWEL OR 17X26 10 PK STRL BLUE (TOWEL DISPOSABLE) ×8 IMPLANT
TRAY FOLEY CATH 16FRSI W/METER (SET/KITS/TRAYS/PACK) ×4 IMPLANT
TUBING EXTENTION W/L.L. (IV SETS) IMPLANT
UNDERPAD 30X30 INCONTINENT (UNDERPADS AND DIAPERS) ×4 IMPLANT
WATER STERILE IRR 1000ML POUR (IV SOLUTION) ×4 IMPLANT

## 2013-07-02 NOTE — H&P (View-Only) (Signed)
   Patient name: [name] MRN: [id number] DOB: 01/20/1936 Sex: male     Chief Complaint  Patient presents with  . Re-evaluation    1 month f/u - unna boot R LE, now has unna boot on R LE - c/o     HISTORY OF PRESENT ILLNESS: The patient is back today for a wound check. He is status post right femoral to below knee popliteal artery bypass graft with Gore-Tex. This was performed on 03/28/2013 for nonhealing ulcers.  I had originally placed the patient in a Unna boot bilaterally and sent into the wound center.  He is back for followup.  He has had a significant decline in the wound on the right leg.  He is now back in a Unna boot.  He complains of significant pain which keeps him up at night.   Past Medical History  Diagnosis Date  . Hypokalemia   . Colon cancer   . PVD (peripheral vascular disease)   . HTN (hypertension)   . Automatic implantable cardiac defibrillator in situ   . CHF (congestive heart failure)   . Ventricular tachycardia   . Ischemic heart disease, chronic   . Dyslipidemia   . CAD (coronary artery disease)   . COPD (chronic obstructive pulmonary disease)   . Emphysema   . On home oxygen therapy   . Pacemaker   . Self-catheterizes urinary bladder     since back surgery.  6- 7 times a day.  . Difficult intubation     pt states "not aware of difficult intubation, never told"  . Heart murmur     dx 1974  . Stroke     TIA- 1980's  . Shortness of breath   . Diabetes mellitus without complication     boderline  . Head injury, closed, with brief LOC 2013  . Anemia   . Neurogenic bladder     since back surgery - self caths  . Neurogenic bowel     digitally stimulates daily.    Past Surgical History  Procedure Laterality Date  . Lumbar back surgery      x 3  fusion- lumbar  . Iliac artery stent      left  . Implantation of medtronic concerto bi-v icd  04-10-08    Gregg Taylor MD  . Colonoscopy    . Polypectomy    . Insertion of iliac stent   06/04/2012    right external   . Cardiac catheterization    . Insert / replace / remove pacemaker    . Endarterectomy femoral  06/28/2012    Procedure: ENDARTERECTOMY FEMORAL;  Surgeon: Vance W Brabham, MD;  Location: [facility] OR;  Service: Vascular;  Laterality: Left;  . Patch angioplasty  06/28/2012    Procedure: PATCH ANGIOPLASTY;  Surgeon: Vance W Brabham, MD;  Location: [facility] OR;  Service: Vascular;  Laterality: Left;  Using 0.8 cm x 15.2 cm Hemashield Patch.  . Colon surgery  03/2004  . Eye surgery Bilateral     cataracts  . Cholecystectomy    . Coronary artery bypass graft  1998    x3 vessels  . Femoral-popliteal bypass graft Right 03/28/2013    Procedure: BYPASS GRAFT FEMORAL-BELOW KNEE POPLITEAL ARTERY Using 6mm x 80 cm Propaten Graft ,With Right Common Femoral Artery Endarterectomy.;  Surgeon: Vance W Brabham, MD;  Location: [facility] OR;  Service: Vascular;  Laterality: Right;    History   Social History  . Marital Status: Married      Spouse Name: N/A    Number of Children: N/A  . Years of Education: N/A   Occupational History  .      Retired [employer]   Social History Main Topics  . Smoking status: Former Smoker -- 3.00 packs/day for 48 years    Types: Cigarettes    Quit date: 07/03/2002  . Smokeless tobacco: Former User    Quit date: 07/11/2002  . Alcohol Use: Yes     Comment: rarely  . Drug Use: No  . Sexual Activity: Not on file   Other Topics Concern  . Not on file   Social History Narrative   Married, wife Sarah   Retired-[employer]   Oxygen-continuous   I/O self cath since back surgery    Family History  Problem Relation Age of Onset  . Heart failure Father   . Heart disease Mother     before age 60  . Hyperlipidemia Mother   . Hypertension Mother     Allergies as of 06/16/2013  . (No Known Allergies)    Current Outpatient Prescriptions on File Prior to Visit  Medication Sig Dispense Refill  . albuterol (PROVENTIL HFA;VENTOLIN HFA) 108 (90 BASE) MCG/ACT  inhaler Inhale 2 puffs into the lungs as needed. Inhale 1-2 puffs into the lungs every 6 (six) hours as needed. Shortness of breath      . albuterol (PROVENTIL) (2.5 MG/3ML) 0.083% nebulizer solution Take 2.5 mg by nebulization 4 (four) times daily.       . amLODipine (NORVASC) 5 MG tablet Take 5 mg by mouth every morning.       . aspirin 325 MG tablet Take 325 mg by mouth daily.      . budesonide (PULMICORT) 0.5 MG/2ML nebulizer solution Take 0.5 mg by nebulization 2 (two) times daily.       . candesartan (ATACAND) 32 MG tablet Take 32 mg by mouth daily.       . carvedilol (COREG) 25 MG tablet Take 25 mg by mouth 2 (two) times daily with a meal.      . collagenase (SANTYL) ointment Apply 1 application topically daily.      . ferrous sulfate 325 (65 FE) MG tablet Take 325 mg by mouth at bedtime.       . furosemide (LASIX) 40 MG tablet Take 40-80 mg by mouth 2 (two) times daily. Take 2 tablets in the morning and 1 tablet in the evening      . guaiFENesin (MUCINEX) 600 MG 12 hr tablet Take 600 mg by mouth 2 (two) times daily as needed. congestion      . HYDROcodone-acetaminophen (VICODIN) 2.5-500 MG per tablet Take 1 tablet by mouth every 6 (six) hours as needed. Back pain  30 tablet  0  . LANOXIN 125 MCG tablet TAKE 1 TABLET DAILY  90 tablet  2  . nitroGLYCERIN (NITRODUR - DOSED IN MG/24 HR) 0.2 mg/hr patch Place 1 patch onto the skin daily.      . nitroGLYCERIN (NITROSTAT) 0.4 MG SL tablet Place 0.4 mg under the tongue every 5 (five) minutes as needed for chest pain.      . potassium chloride SA (K-DUR,KLOR-CON) 20 MEQ tablet Take 1 tablet (20 mEq total) by mouth daily.  90 tablet  3  . predniSONE (DELTASONE) 10 MG tablet Take 10 mg by mouth daily.      . PROVENTIL HFA 108 (90 BASE) MCG/ACT inhaler INHALE 1 TO 2 PUFFS INTO THE LUNGS EVERY 6 HOURS AS   NEEDED FOR SHORTNESS OF BREATH  3 each  2  . simvastatin (ZOCOR) 20 MG tablet Take 20 mg by mouth at bedtime.       . tiotropium (SPIRIVA) 18 MCG  inhalation capsule Place 18 mcg into inhaler and inhale daily.        No current facility-administered medications on file prior to visit.     REVIEW OF SYSTEMS: Please see history of present illness, otherwise all systems are negative  PHYSICAL EXAMINATION:   Vital signs are BP 153/67  Pulse 77  Ht 5' 7" (1.702 m)  Wt 179 lb (81.194 kg)  BMI 28.03 kg/m2  SpO2 99% General: The patient appears their stated age. HEENT:  No gross abnormalities Pulmonary:  Non labored breathing Musculoskeletal: There are no major deformities. Neurologic: No focal weakness or paresthesias are detected, Skin: Desquamation of right calf.  There is also a small ulcer on the lateral left leg. Psychiatric: The patient has normal affect. Cardiovascular: There is a regular rate and rhythm without significant murmur appreciated.   Diagnostic Studies I have reviewed his ultrasound today.  This shows monophasic waveforms throughout his bypass graft.  No stenosis is identified within his bypass  Assessment: Bilateral lower extremity ulcers Plan: The patient's bypass graft is widely patent without any problems.  However, ultrasound did identify monophasic waveforms in the right leg.  This suggests that there may be a component of aortoiliac disease.  The patient has a history of a right external iliac stent.  I told the patient that I think this needs to be evaluated so that it doesn't compromise his right leg bypass graft.  I feel the best way to do this is angiography.  I have scheduled him for an aortogram with bifemoral runoff with possible intervention to the right aortoiliac segment on Wednesday, December 31.  L. plan on cannulation of the left femoral artery, aortogram with bilateral runoff and possible intervention.  Risks and a were discussed with the patient and his wife.  The patient may benefit from a venous reflux evaluation at a later date  V. Wells Brabham IV, M.D. [facility]  [place] Office: [phone] Pager:  [phone]   

## 2013-07-02 NOTE — Op Note (Signed)
Patient name: [name] MRN: [id number] DOB: 05-01-1936 Sex: male  07/02/2013 Pre-operative Diagnosis: Bilateral lower extremity ulcers Post-operative diagnosis:  Same Surgeon:  Jorge Ny Procedure Performed:  1.  ultrasound-guided access, left femoral artery  2.  abdominal aortogram  3.  bilateral lower extremity runoff  4.  second order catheterization  5.  stent, right external iliac artery     Indications:  The patient has previously undergone a right femoral below knee popliteal artery bypass graft with Gore-Tex.  Ultrasound identified that the bypass graft was widely patent, but there were monophasic waveforms throughout.  This suggested iliac disease.  He has a history of iliac stenting, therefore I recommended angiography.  Procedure:  The patient was identified in the holding area and taken to room 8.  The patient was then placed supine on the table and prepped and draped in the usual sterile fashion.  A time out was called.  Ultrasound was used to evaluate the left common femoral artery.  It was patent .  A digital ultrasound image was acquired.  A micropuncture needle was used to access the left common femoral artery under ultrasound guidance.  An 018 wire was advanced without resistance and a micropuncture sheath was placed.  The 018 wire was removed and a benson wire was placed.  The micropuncture sheath was exchanged for a 5 french sheath.  An omniflush catheter was advanced over the wire to the level of L-1.  An abdominal angiogram was obtained.  The catheter was pulled down to the aortic bifurcation and oblique images of the pelvis were taken, followed by bilateral lower extremity runoff  Findings:   Aortogram:  No significant infrarenal aortic stenosis is identified however the aorta is heavily calcified.   The iliac artery on the left is calcified but patent.  On the right, there is a high-grade stenosis within the external iliac artery, just proximal to the  external iliac stent.  Right Lower Extremity:  Right common femoral artery is widely patent.  A femoral to below knee popliteal artery bypass graft is widely patent with the dominant runoff vessel being the peroneal artery.  Left Lower Extremity:  The left common femoral artery is widely patent.  Diffuse disease is noted throughout the superficial femoral artery.  The dominant vessel is the posterior tibial artery across the ankle.  The anterior tibial artery is visualized down at the ankle but diminutive.  Intervention:  After the above images were acquired, the decision was made to proceed with intervention.  I tried to advance a 6 [place] 45 cm sheath over the aortic bifurcation, however because of the calcification I could only get into the proximal left common iliac artery.  This was done over an Amplatz superstiff wire.  The patient was fully heparinized I elected to proceed with stenting from this sheath in this position.  An 8 x 30 Cordis self-expanding stent was advanced over the bifurcation without resistance and positioned within the previously deployed stent.  The stent was then deployed and dilated with a 7 x 30 balloon.  Completion angiography revealed widely patent external iliac artery.  With this the balloon and wire were removed.  The sheath was withdrawn to the left external iliac artery, the patient was taken the holding area for sheath pull.  Impression:  #1  successful stenting of a 95% right external iliac artery stenosis using a Cordis 8 x 30 stent   V. Durene Cal, M.D. [facility] [place] Office: [phone] Pager:  [phone]

## 2013-07-02 NOTE — Interval H&P Note (Signed)
History and Physical Interval Note:  07/02/2013 8:32 AM  [name]  has presented today for surgery, with the diagnosis of PVD  The various methods of treatment have been discussed with the patient and family. After consideration of risks, benefits and other options for treatment, the patient has consented to  Procedure(s): ABDOMINAL AORTAGRAM (N/A) as a surgical intervention .  The patient's history has been reviewed, patient examined, no change in status, stable for surgery.  I have reviewed the patient's chart and labs.  Questions were answered to the patient's satisfaction.     BRABHAM IV, V. WELLS

## 2013-07-02 NOTE — Telephone Encounter (Addendum)
Message copied by Fredrich Birks on Wed Jul 02, 2013  2:29 PM ------      Message from: [place], [place] K      Created: Wed Jul 02, 2013 10:27 AM      Regarding: schedule                   ----- Message -----         From: Melene Plan, RN         Sent: 07/02/2013  10:17 AM           To: Sharee Pimple, CMA                        ----- Message -----         From: Nada Libman, MD         Sent: 07/02/2013   9:53 AM           To: Reuel Derby, Melene Plan, RN            06/22/2013:            Surgeon:  Jorge Ny      Procedure Performed:       1.  ultrasound-guided access, left femoral artery       2.  abdominal aortogram       3.  bilateral lower extremity runoff       4.  second order catheterization       5.  stent, right external iliac artery                   Followup in 6 weeks with duplex ultrasound and ABIs.             ------  07/02/13: lm for pt, mailed letter, dpm

## 2013-07-03 ENCOUNTER — Other Ambulatory Visit: Payer: Self-pay | Admitting: *Deleted

## 2013-07-03 DIAGNOSIS — I2510 Atherosclerotic heart disease of native coronary artery without angina pectoris: Secondary | ICD-10-CM

## 2013-07-07 ENCOUNTER — Encounter (HOSPITAL_BASED_OUTPATIENT_CLINIC_OR_DEPARTMENT_OTHER): Payer: Medicare Other | Attending: General Surgery

## 2013-07-07 ENCOUNTER — Ambulatory Visit (INDEPENDENT_AMBULATORY_CARE_PROVIDER_SITE_OTHER): Payer: Medicare Other | Admitting: *Deleted

## 2013-07-07 DIAGNOSIS — L97909 Non-pressure chronic ulcer of unspecified part of unspecified lower leg with unspecified severity: Secondary | ICD-10-CM | POA: Insufficient documentation

## 2013-07-07 DIAGNOSIS — I87319 Chronic venous hypertension (idiopathic) with ulcer of unspecified lower extremity: Secondary | ICD-10-CM | POA: Insufficient documentation

## 2013-07-07 DIAGNOSIS — L03119 Cellulitis of unspecified part of limb: Secondary | ICD-10-CM

## 2013-07-07 DIAGNOSIS — L02419 Cutaneous abscess of limb, unspecified: Secondary | ICD-10-CM | POA: Insufficient documentation

## 2013-07-07 DIAGNOSIS — L89109 Pressure ulcer of unspecified part of back, unspecified stage: Secondary | ICD-10-CM | POA: Insufficient documentation

## 2013-07-07 DIAGNOSIS — Z95810 Presence of automatic (implantable) cardiac defibrillator: Secondary | ICD-10-CM

## 2013-07-07 DIAGNOSIS — L8992 Pressure ulcer of unspecified site, stage 2: Secondary | ICD-10-CM | POA: Insufficient documentation

## 2013-07-07 DIAGNOSIS — I5022 Chronic systolic (congestive) heart failure: Secondary | ICD-10-CM

## 2013-07-07 DIAGNOSIS — I739 Peripheral vascular disease, unspecified: Secondary | ICD-10-CM | POA: Insufficient documentation

## 2013-07-07 LAB — POCT ACTIVATED CLOTTING TIME: Activated Clotting Time: 193 seconds

## 2013-07-07 NOTE — Progress Notes (Signed)
EPIC Encounter for ICM Monitoring  Patient Name: [name] is a 78 y.o. male Date: 07/07/2013 Primary Care Physican: Horton Finer, MD Primary Cardiologist: Tamala Julian  Electrophysiologist: Lovena Le Dry Weight: 175 lbs  Bi-V pacing: 99.9%      In the past month, have you:  1. Gained more than 2 pounds in a day or more than 5 pounds in a week? no  2. Had changes in your medications (with verification of current medications)? no  3. Had more shortness of breath than is usual for you? no  4. Limited your activity because of shortness of breath? no  5. Not been able to sleep because of shortness of breath? no  6. Had increased swelling in your feet or ankles? no  7. Had symptoms of dehydration (dizziness, dry mouth, increased thirst, decreased urine output) no  8. Had changes in sodium restriction? no  9. Been compliant with medication? Yes  ** Patient s/p stenting to the external right iliac artery on 07/02/13. He complains of continued non-healing wounds to the right leg. He is followed by the wound center and has an appointment with them today.**  ICM trend:   Follow-up plan: ICM clinic phone appointment : 08/15/13  Copy of note sent to patient's primary care physician, primary cardiologist, and device following physician.  Alvis Lemmings, RN, BSN 07/07/2013 10:49 AM

## 2013-07-11 ENCOUNTER — Encounter (HOSPITAL_BASED_OUTPATIENT_CLINIC_OR_DEPARTMENT_OTHER): Payer: Medicare Other

## 2013-07-21 ENCOUNTER — Other Ambulatory Visit (INDEPENDENT_AMBULATORY_CARE_PROVIDER_SITE_OTHER): Payer: Medicare Other

## 2013-07-21 DIAGNOSIS — I2510 Atherosclerotic heart disease of native coronary artery without angina pectoris: Secondary | ICD-10-CM

## 2013-07-21 LAB — LIPID PANEL
Cholesterol: 99 mg/dL (ref 0–200)
HDL: 37.7 mg/dL — AB (ref >39.00)
LDL Cholesterol: 49 mg/dL (ref 0–99)
TRIGLYCERIDES: 64 mg/dL (ref 0.0–149.0)
Total CHOL/HDL Ratio: 3
VLDL: 12.8 mg/dL (ref 0.0–40.0)

## 2013-07-21 LAB — HEPATIC FUNCTION PANEL
ALBUMIN: 3.6 g/dL (ref 3.5–5.2)
ALT: 17 U/L (ref 0–53)
AST: 17 U/L (ref 0–37)
Alkaline Phosphatase: 87 U/L (ref 39–117)
Bilirubin, Direct: 0.1 mg/dL (ref 0.0–0.3)
Total Bilirubin: 0.7 mg/dL (ref 0.3–1.2)
Total Protein: 7.7 g/dL (ref 6.0–8.3)

## 2013-07-25 ENCOUNTER — Telehealth: Payer: Self-pay

## 2013-07-25 DIAGNOSIS — E785 Hyperlipidemia, unspecified: Secondary | ICD-10-CM

## 2013-07-25 NOTE — Telephone Encounter (Signed)
Message copied by Lamar Laundry on Fri Jul 25, 2013  4:50 PM ------      Message from: Daneen Schick      Created: Tue Jul 22, 2013  7:32 AM       At target. Repeat labs in 1 year. ------

## 2013-07-25 NOTE — Telephone Encounter (Signed)
pt given lab resultslipids .At target. Repeat labs in 1 year.

## 2013-08-05 ENCOUNTER — Ambulatory Visit (INDEPENDENT_AMBULATORY_CARE_PROVIDER_SITE_OTHER): Payer: Medicare Other | Admitting: Pulmonary Disease

## 2013-08-05 ENCOUNTER — Encounter: Payer: Self-pay | Admitting: Pulmonary Disease

## 2013-08-05 VITALS — BP 128/52 | HR 76 | Temp 97.6°F | Ht 67.0 in | Wt 177.0 lb

## 2013-08-05 DIAGNOSIS — J439 Emphysema, unspecified: Secondary | ICD-10-CM

## 2013-08-05 DIAGNOSIS — J9610 Chronic respiratory failure, unspecified whether with hypoxia or hypercapnia: Secondary | ICD-10-CM | POA: Insufficient documentation

## 2013-08-05 DIAGNOSIS — J438 Other emphysema: Secondary | ICD-10-CM

## 2013-08-05 NOTE — Progress Notes (Signed)
   Subjective:    Patient ID: [name], male    DOB: 1935-12-11, 78 y.o.   MRN: [id number]  HPI The patient comes in today for followup of his known COPD with chronic respiratory failure. He also has an underlying cardiomyopathy. He continues to do surprisingly well, and has not had an acute exacerbation or pulmonary infection since last visit. He feels that his breathing is at baseline, and is having no difficulties with his medications.   Review of Systems  Constitutional: Negative for fever and unexpected weight change.  HENT: Negative for congestion, dental problem, ear pain, nosebleeds, postnasal drip, rhinorrhea, sinus pressure, sneezing, sore throat and trouble swallowing.   Eyes: Negative for redness and itching.  Respiratory: Positive for shortness of breath. Negative for cough, chest tightness and wheezing.   Cardiovascular: Negative for palpitations and leg swelling.  Gastrointestinal: Negative for nausea and vomiting.  Genitourinary: Negative for dysuria.  Musculoskeletal: Negative for joint swelling.  Skin: Negative for rash.  Neurological: Negative for headaches.  Hematological: Does not bruise/bleed easily.  Psychiatric/Behavioral: Negative for dysphoric mood. The patient is not nervous/anxious.        Objective:   Physical Exam Well-developed male in no acute distress Nose without purulence or discharge noted Neck without lymphadenopathy or thyromegaly Chest with mildly decreased breath sounds, but adequate airflow and no wheezing Cardiac exam with regular rate and rhythm, 2/6 systolic murmur Lower extremities with mild edema, wraps in place Alert and oriented, moves all 4 extremities.       Assessment & Plan:

## 2013-08-05 NOTE — Assessment & Plan Note (Signed)
The patient appears to be stable on his current medication regimen. I have asked him to stay as active as possible, and to continue on his oxygen and bronchodilators. I will see him back in 4 months if he is stable.

## 2013-08-05 NOTE — Patient Instructions (Signed)
No change in medications Try to stay as active as possible. followup with me again in 73mos.

## 2013-08-08 ENCOUNTER — Emergency Department (HOSPITAL_COMMUNITY): Payer: No Typology Code available for payment source

## 2013-08-08 ENCOUNTER — Encounter: Payer: Self-pay | Admitting: Surgery

## 2013-08-08 ENCOUNTER — Encounter (HOSPITAL_COMMUNITY): Payer: Self-pay | Admitting: Emergency Medicine

## 2013-08-08 ENCOUNTER — Encounter (HOSPITAL_COMMUNITY): Payer: Medicare Other

## 2013-08-08 ENCOUNTER — Observation Stay (HOSPITAL_COMMUNITY): Payer: No Typology Code available for payment source

## 2013-08-08 ENCOUNTER — Observation Stay (HOSPITAL_COMMUNITY)
Admission: EM | Admit: 2013-08-08 | Discharge: 2013-08-09 | Disposition: A | Discharge status: Home / Self Care | Payer: No Typology Code available for payment source | Attending: Cardiology | Admitting: Cardiology

## 2013-08-08 ENCOUNTER — Encounter (HOSPITAL_BASED_OUTPATIENT_CLINIC_OR_DEPARTMENT_OTHER): Payer: Medicare Other | Attending: General Surgery

## 2013-08-08 DIAGNOSIS — L8992 Pressure ulcer of unspecified site, stage 2: Secondary | ICD-10-CM | POA: Insufficient documentation

## 2013-08-08 DIAGNOSIS — Z87891 Personal history of nicotine dependence: Secondary | ICD-10-CM | POA: Insufficient documentation

## 2013-08-08 DIAGNOSIS — E119 Type 2 diabetes mellitus without complications: Secondary | ICD-10-CM | POA: Diagnosis not present

## 2013-08-08 DIAGNOSIS — J438 Other emphysema: Secondary | ICD-10-CM | POA: Diagnosis not present

## 2013-08-08 DIAGNOSIS — I739 Peripheral vascular disease, unspecified: Secondary | ICD-10-CM | POA: Diagnosis not present

## 2013-08-08 DIAGNOSIS — I255 Ischemic cardiomyopathy: Secondary | ICD-10-CM | POA: Diagnosis present

## 2013-08-08 DIAGNOSIS — I509 Heart failure, unspecified: Secondary | ICD-10-CM | POA: Diagnosis not present

## 2013-08-08 DIAGNOSIS — Z951 Presence of aortocoronary bypass graft: Secondary | ICD-10-CM | POA: Insufficient documentation

## 2013-08-08 DIAGNOSIS — Z9981 Dependence on supplemental oxygen: Secondary | ICD-10-CM | POA: Diagnosis not present

## 2013-08-08 DIAGNOSIS — I4891 Unspecified atrial fibrillation: Secondary | ICD-10-CM | POA: Insufficient documentation

## 2013-08-08 DIAGNOSIS — M549 Dorsalgia, unspecified: Secondary | ICD-10-CM | POA: Diagnosis not present

## 2013-08-08 DIAGNOSIS — I2510 Atherosclerotic heart disease of native coronary artery without angina pectoris: Secondary | ICD-10-CM | POA: Insufficient documentation

## 2013-08-08 DIAGNOSIS — I2589 Other forms of chronic ischemic heart disease: Secondary | ICD-10-CM | POA: Insufficient documentation

## 2013-08-08 DIAGNOSIS — M542 Cervicalgia: Secondary | ICD-10-CM

## 2013-08-08 DIAGNOSIS — G8929 Other chronic pain: Secondary | ICD-10-CM | POA: Diagnosis not present

## 2013-08-08 DIAGNOSIS — I059 Rheumatic mitral valve disease, unspecified: Secondary | ICD-10-CM

## 2013-08-08 DIAGNOSIS — S39012A Strain of muscle, fascia and tendon of lower back, initial encounter: Secondary | ICD-10-CM

## 2013-08-08 DIAGNOSIS — K592 Neurogenic bowel, not elsewhere classified: Secondary | ICD-10-CM | POA: Insufficient documentation

## 2013-08-08 DIAGNOSIS — E785 Hyperlipidemia, unspecified: Secondary | ICD-10-CM | POA: Diagnosis not present

## 2013-08-08 DIAGNOSIS — L89309 Pressure ulcer of unspecified buttock, unspecified stage: Secondary | ICD-10-CM | POA: Insufficient documentation

## 2013-08-08 DIAGNOSIS — R079 Chest pain, unspecified: Principal | ICD-10-CM | POA: Insufficient documentation

## 2013-08-08 DIAGNOSIS — I472 Ventricular tachycardia: Secondary | ICD-10-CM

## 2013-08-08 DIAGNOSIS — N319 Neuromuscular dysfunction of bladder, unspecified: Secondary | ICD-10-CM | POA: Insufficient documentation

## 2013-08-08 DIAGNOSIS — I10 Essential (primary) hypertension: Secondary | ICD-10-CM | POA: Diagnosis not present

## 2013-08-08 DIAGNOSIS — D649 Anemia, unspecified: Secondary | ICD-10-CM | POA: Diagnosis not present

## 2013-08-08 DIAGNOSIS — Z95810 Presence of automatic (implantable) cardiac defibrillator: Secondary | ICD-10-CM | POA: Diagnosis present

## 2013-08-08 DIAGNOSIS — D472 Monoclonal gammopathy: Secondary | ICD-10-CM | POA: Diagnosis not present

## 2013-08-08 DIAGNOSIS — Z950 Presence of cardiac pacemaker: Secondary | ICD-10-CM | POA: Diagnosis not present

## 2013-08-08 DIAGNOSIS — I5022 Chronic systolic (congestive) heart failure: Secondary | ICD-10-CM

## 2013-08-08 DIAGNOSIS — S065X9A Traumatic subdural hemorrhage with loss of consciousness of unspecified duration, initial encounter: Secondary | ICD-10-CM | POA: Diagnosis present

## 2013-08-08 DIAGNOSIS — I4819 Other persistent atrial fibrillation: Secondary | ICD-10-CM | POA: Diagnosis present

## 2013-08-08 DIAGNOSIS — J439 Emphysema, unspecified: Secondary | ICD-10-CM | POA: Diagnosis present

## 2013-08-08 DIAGNOSIS — Z8673 Personal history of transient ischemic attack (TIA), and cerebral infarction without residual deficits: Secondary | ICD-10-CM | POA: Diagnosis not present

## 2013-08-08 DIAGNOSIS — I208 Other forms of angina pectoris: Secondary | ICD-10-CM

## 2013-08-08 DIAGNOSIS — Z85038 Personal history of other malignant neoplasm of large intestine: Secondary | ICD-10-CM | POA: Diagnosis not present

## 2013-08-08 DIAGNOSIS — L89109 Pressure ulcer of unspecified part of back, unspecified stage: Secondary | ICD-10-CM | POA: Insufficient documentation

## 2013-08-08 DIAGNOSIS — S065XAA Traumatic subdural hemorrhage with loss of consciousness status unknown, initial encounter: Secondary | ICD-10-CM | POA: Diagnosis present

## 2013-08-08 DIAGNOSIS — I4729 Other ventricular tachycardia: Secondary | ICD-10-CM

## 2013-08-08 LAB — CBC WITH DIFFERENTIAL/PLATELET
BASOS ABS: 0 10*3/uL (ref 0.0–0.1)
BASOS ABS: 0.1 10*3/uL (ref 0.0–0.1)
BASOS PCT: 1 % (ref 0–1)
Basophils Relative: 0 % (ref 0–1)
EOS ABS: 0 10*3/uL (ref 0.0–0.7)
EOS PCT: 0 % (ref 0–5)
Eosinophils Absolute: 0.1 10*3/uL (ref 0.0–0.7)
Eosinophils Relative: 1 % (ref 0–5)
HCT: 33.4 % — ABNORMAL LOW (ref 39.0–52.0)
HEMATOCRIT: 32.2 % — AB (ref 39.0–52.0)
HEMOGLOBIN: 10.4 g/dL — AB (ref 13.0–17.0)
HEMOGLOBIN: 11 g/dL — AB (ref 13.0–17.0)
LYMPHS PCT: 20 % (ref 12–46)
Lymphocytes Relative: 9 % — ABNORMAL LOW (ref 12–46)
Lymphs Abs: 0.9 10*3/uL (ref 0.7–4.0)
Lymphs Abs: 1.9 10*3/uL (ref 0.7–4.0)
MCH: 29.9 pg (ref 26.0–34.0)
MCH: 30.2 pg (ref 26.0–34.0)
MCHC: 32.3 g/dL (ref 30.0–36.0)
MCHC: 32.9 g/dL (ref 30.0–36.0)
MCV: 92.5 fL (ref 78.0–100.0)
MCV: 91.8 fL (ref 78.0–100.0)
MONO ABS: 0.8 10*3/uL (ref 0.1–1.0)
MONOS PCT: 9 % (ref 3–12)
Monocytes Absolute: 0.9 10*3/uL (ref 0.1–1.0)
Monocytes Relative: 9 % (ref 3–12)
NEUTROS ABS: 8.4 10*3/uL — AB (ref 1.7–7.7)
NEUTROS PCT: 69 % (ref 43–77)
Neutro Abs: 6.5 10*3/uL (ref 1.7–7.7)
Neutrophils Relative %: 82 % — ABNORMAL HIGH (ref 43–77)
Platelets: 214 10*3/uL (ref 150–400)
Platelets: 224 10*3/uL (ref 150–400)
RBC: 3.48 MIL/uL — ABNORMAL LOW (ref 4.22–5.81)
RBC: 3.64 MIL/uL — ABNORMAL LOW (ref 4.22–5.81)
RDW: 15 % (ref 11.5–15.5)
RDW: 15 % (ref 11.5–15.5)
WBC: 10.2 10*3/uL (ref 4.0–10.5)
WBC: 9.4 10*3/uL (ref 4.0–10.5)

## 2013-08-08 LAB — BASIC METABOLIC PANEL
BUN: 26 mg/dL — AB (ref 6–23)
CHLORIDE: 96 meq/L (ref 96–112)
CO2: 30 mEq/L (ref 19–32)
CREATININE: 1.05 mg/dL (ref 0.50–1.35)
Calcium: 9.3 mg/dL (ref 8.4–10.5)
GFR calc non Af Amer: 67 mL/min — ABNORMAL LOW (ref >90)
GFR, EST AFRICAN AMERICAN: 78 mL/min — AB (ref >90)
GLUCOSE: 132 mg/dL — AB (ref 70–99)
POTASSIUM: 5 meq/L (ref 3.7–5.3)
Sodium: 138 mEq/L (ref 137–147)

## 2013-08-08 LAB — COMPREHENSIVE METABOLIC PANEL
ALBUMIN: 3.7 g/dL (ref 3.5–5.2)
ALT: 16 U/L (ref 0–53)
AST: 22 U/L (ref 0–37)
Alkaline Phosphatase: 88 U/L (ref 39–117)
BUN: 22 mg/dL (ref 6–23)
CALCIUM: 9.5 mg/dL (ref 8.4–10.5)
CO2: 28 meq/L (ref 19–32)
CREATININE: 1.1 mg/dL (ref 0.50–1.35)
Chloride: 95 mEq/L — ABNORMAL LOW (ref 96–112)
GFR calc Af Amer: 74 mL/min — ABNORMAL LOW (ref >90)
GFR, EST NON AFRICAN AMERICAN: 64 mL/min — AB (ref >90)
Glucose, Bld: 136 mg/dL — ABNORMAL HIGH (ref 70–99)
Potassium: 4.9 mEq/L (ref 3.7–5.3)
SODIUM: 138 meq/L (ref 137–147)
Total Bilirubin: 0.6 mg/dL (ref 0.3–1.2)
Total Protein: 8 g/dL (ref 6.0–8.3)

## 2013-08-08 LAB — TROPONIN I: Troponin I: <0.3 ng/mL (ref <0.30)

## 2013-08-08 LAB — APTT: APTT: 30 s (ref 24–37)

## 2013-08-08 LAB — PROTIME-INR
INR: 1.1 (ref 0.00–1.49)
Prothrombin Time: 14 seconds (ref 11.6–15.2)

## 2013-08-08 LAB — DIGOXIN LEVEL: Digoxin Level: 0.7 ng/mL — ABNORMAL LOW (ref 0.8–2.0)

## 2013-08-08 MED ORDER — PREDNISONE 10 MG PO TABS
10.0000 mg | ORAL_TABLET | Freq: Every day | ORAL | Status: DC
  Administered 2013-08-09: 10 mg via ORAL
  Filled 2013-08-08: qty 1

## 2013-08-08 MED ORDER — GUAIFENESIN ER 600 MG PO TB12
600.0000 mg | ORAL_TABLET | Freq: Two times a day (BID) | ORAL | Status: DC | PRN
  Administered 2013-08-08: 600 mg via ORAL
  Filled 2013-08-08: qty 1

## 2013-08-08 MED ORDER — FUROSEMIDE 40 MG PO TABS
40.0000 mg | ORAL_TABLET | Freq: Two times a day (BID) | ORAL | Status: DC
  Filled 2013-08-08 (×2): qty 2

## 2013-08-08 MED ORDER — NITROGLYCERIN 0.2 MG/HR TD PT24
0.2000 mg | MEDICATED_PATCH | Freq: Every day | TRANSDERMAL | Status: DC
  Administered 2013-08-09: 0.2 mg via TRANSDERMAL
  Filled 2013-08-08: qty 1

## 2013-08-08 MED ORDER — FUROSEMIDE 80 MG PO TABS
80.0000 mg | ORAL_TABLET | Freq: Every day | ORAL | Status: DC
  Administered 2013-08-09: 80 mg via ORAL
  Filled 2013-08-08: qty 1

## 2013-08-08 MED ORDER — AMLODIPINE BESYLATE 5 MG PO TABS
5.0000 mg | ORAL_TABLET | Freq: Every morning | ORAL | Status: DC
  Administered 2013-08-09: 5 mg via ORAL
  Filled 2013-08-08: qty 1

## 2013-08-08 MED ORDER — NITROGLYCERIN 0.4 MG SL SUBL: 0.4000 mg | SUBLINGUAL_TABLET | SUBLINGUAL | Status: DC | PRN

## 2013-08-08 MED ORDER — ONDANSETRON HCL 4 MG/2ML IJ SOLN: 4.0000 mg | Freq: Four times a day (QID) | INTRAMUSCULAR | Status: DC | PRN

## 2013-08-08 MED ORDER — HYDROCODONE-ACETAMINOPHEN 5-325 MG PO TABS
1.0000 | ORAL_TABLET | Freq: Once | ORAL | Status: AC
  Administered 2013-08-08: 1 via ORAL
  Filled 2013-08-08: qty 1

## 2013-08-08 MED ORDER — ASPIRIN 325 MG PO TABS
325.0000 mg | ORAL_TABLET | Freq: Every day | ORAL | Status: DC
  Administered 2013-08-09: 325 mg via ORAL
  Filled 2013-08-08: qty 1

## 2013-08-08 MED ORDER — IRBESARTAN 300 MG PO TABS
300.0000 mg | ORAL_TABLET | Freq: Every day | ORAL | Status: DC
  Administered 2013-08-09: 300 mg via ORAL
  Filled 2013-08-08: qty 1

## 2013-08-08 MED ORDER — BUDESONIDE 0.5 MG/2ML IN SUSP
0.5000 mg | Freq: Two times a day (BID) | RESPIRATORY_TRACT | Status: DC
  Administered 2013-08-08 – 2013-08-09 (×2): 0.5 mg via RESPIRATORY_TRACT
  Filled 2013-08-08 (×3): qty 2

## 2013-08-08 MED ORDER — FUROSEMIDE 40 MG PO TABS
40.0000 mg | ORAL_TABLET | Freq: Every day | ORAL | Status: DC
  Administered 2013-08-08: 40 mg via ORAL
  Filled 2013-08-08 (×2): qty 1

## 2013-08-08 MED ORDER — IOHEXOL 350 MG/ML SOLN
100.0000 mL | Freq: Once | INTRAVENOUS | Status: AC | PRN
  Administered 2013-08-08: 100 mL via INTRAVENOUS

## 2013-08-08 MED ORDER — CARVEDILOL 25 MG PO TABS
25.0000 mg | ORAL_TABLET | Freq: Two times a day (BID) | ORAL | Status: DC
  Administered 2013-08-09: 25 mg via ORAL
  Filled 2013-08-08 (×3): qty 1

## 2013-08-08 MED ORDER — TIOTROPIUM BROMIDE MONOHYDRATE 18 MCG IN CAPS
18.0000 ug | ORAL_CAPSULE | Freq: Every day | RESPIRATORY_TRACT | Status: DC
  Administered 2013-08-09: 18 ug via RESPIRATORY_TRACT
  Filled 2013-08-08: qty 5

## 2013-08-08 MED ORDER — SIMVASTATIN 20 MG PO TABS
20.0000 mg | ORAL_TABLET | Freq: Every day | ORAL | Status: DC
  Administered 2013-08-08: 20 mg via ORAL
  Filled 2013-08-08 (×3): qty 1

## 2013-08-08 MED ORDER — ACETAMINOPHEN 325 MG PO TABS
650.0000 mg | ORAL_TABLET | ORAL | Status: DC | PRN
  Administered 2013-08-08 – 2013-08-09 (×2): 650 mg via ORAL
  Filled 2013-08-08 (×2): qty 2

## 2013-08-08 MED ORDER — FERROUS SULFATE 325 (65 FE) MG PO TABS
325.0000 mg | ORAL_TABLET | Freq: Every day | ORAL | Status: DC
  Administered 2013-08-08: 325 mg via ORAL
  Filled 2013-08-08 (×3): qty 1

## 2013-08-08 MED ORDER — POTASSIUM CHLORIDE CRYS ER 20 MEQ PO TBCR
20.0000 meq | EXTENDED_RELEASE_TABLET | Freq: Every day | ORAL | Status: DC
  Administered 2013-08-09: 20 meq via ORAL
  Filled 2013-08-08: qty 1

## 2013-08-08 MED ORDER — ALBUTEROL SULFATE (2.5 MG/3ML) 0.083% IN NEBU
2.5000 mg | INHALATION_SOLUTION | Freq: Four times a day (QID) | RESPIRATORY_TRACT | Status: DC
  Administered 2013-08-08 – 2013-08-09 (×2): 2.5 mg via RESPIRATORY_TRACT
  Filled 2013-08-08 (×2): qty 3

## 2013-08-08 MED ORDER — ALBUTEROL SULFATE (2.5 MG/3ML) 0.083% IN NEBU: 3.0000 mL | INHALATION_SOLUTION | Freq: Four times a day (QID) | RESPIRATORY_TRACT | Status: DC | PRN

## 2013-08-08 MED ORDER — HEPARIN SODIUM (PORCINE) 5000 UNIT/ML IJ SOLN
5000.0000 [IU] | Freq: Three times a day (TID) | INTRAMUSCULAR | Status: DC
  Filled 2013-08-08 (×6): qty 1

## 2013-08-08 MED ORDER — DIGOXIN 125 MCG PO TABS
0.1250 mg | ORAL_TABLET | Freq: Every day | ORAL | Status: DC
  Administered 2013-08-09: 0.125 mg via ORAL
  Filled 2013-08-08: qty 1
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
## 2013-08-08 NOTE — ED Notes (Signed)
Family at bedside. 

## 2013-08-08 NOTE — Progress Notes (Signed)
  Echocardiogram 2D Echocardiogram has been performed.  Basilia Jumbo 08/08/2013, 3:35 PM

## 2013-08-09 DIAGNOSIS — R079 Chest pain, unspecified: Secondary | ICD-10-CM | POA: Diagnosis not present

## 2013-08-09 LAB — BASIC METABOLIC PANEL
BUN: 24 mg/dL — ABNORMAL HIGH (ref 6–23)
CHLORIDE: 98 meq/L (ref 96–112)
CO2: 30 mEq/L (ref 19–32)
Calcium: 9.2 mg/dL (ref 8.4–10.5)
Creatinine, Ser: 1.28 mg/dL (ref 0.50–1.35)
GFR calc Af Amer: 61 mL/min — ABNORMAL LOW (ref >90)
GFR calc non Af Amer: 53 mL/min — ABNORMAL LOW (ref >90)
GLUCOSE: 110 mg/dL — AB (ref 70–99)
Potassium: 4.1 mEq/L (ref 3.7–5.3)
Sodium: 141 mEq/L (ref 137–147)

## 2013-08-09 LAB — CBC
HCT: 30.6 % — ABNORMAL LOW (ref 39.0–52.0)
HEMOGLOBIN: 9.8 g/dL — AB (ref 13.0–17.0)
MCH: 30.1 pg (ref 26.0–34.0)
MCHC: 32 g/dL (ref 30.0–36.0)
MCV: 93.9 fL (ref 78.0–100.0)
PLATELETS: 188 10*3/uL (ref 150–400)
RBC: 3.26 MIL/uL — ABNORMAL LOW (ref 4.22–5.81)
RDW: 15 % (ref 11.5–15.5)
WBC: 7.1 10*3/uL (ref 4.0–10.5)

## 2013-08-09 LAB — TROPONIN I
Troponin I: <0.3 ng/mL (ref <0.30)
Troponin I: <0.3 ng/mL (ref <0.30)

## 2013-08-09 MED ORDER — ACETAMINOPHEN 325 MG PO TABS: 650.0000 mg | ORAL_TABLET | ORAL | Status: AC | PRN

## 2013-08-09 NOTE — Discharge Instructions (Signed)
Chest Pain (Nonspecific) °It is often hard to give a specific diagnosis for the cause of chest pain. There is always a chance that your pain could be related to something serious, such as a heart attack or a blood clot in the lungs. You need to follow up with your caregiver for further evaluation. °CAUSES  °· Heartburn. °· Pneumonia or bronchitis. °· Anxiety or stress. °· Inflammation around your heart (pericarditis) or lung (pleuritis or pleurisy). °· A blood clot in the lung. °· A collapsed lung (pneumothorax). It can develop suddenly on its own (spontaneous pneumothorax) or from injury (trauma) to the chest. °· Shingles infection (herpes zoster virus). °The chest wall is composed of bones, muscles, and cartilage. Any of these can be the source of the pain. °· The bones can be bruised by injury. °· The muscles or cartilage can be strained by coughing or overwork. °· The cartilage can be affected by inflammation and become sore (costochondritis). °DIAGNOSIS  °Lab tests or other studies, such as X-rays, electrocardiography, stress testing, or cardiac imaging, may be needed to find the cause of your pain.  °TREATMENT  °· Treatment depends on what may be causing your chest pain. Treatment may include: °· Acid blockers for heartburn. °· Anti-inflammatory medicine. °· Pain medicine for inflammatory conditions. °· Antibiotics if an infection is present. °· You may be advised to change lifestyle habits. This includes stopping smoking and avoiding alcohol, caffeine, and chocolate. °· You may be advised to keep your head raised (elevated) when sleeping. This reduces the chance of acid going backward from your stomach into your esophagus. °· Most of the time, nonspecific chest pain will improve within 2 to 3 days with rest and mild pain medicine. °HOME CARE INSTRUCTIONS  °· If antibiotics were prescribed, take your antibiotics as directed. Finish them even if you start to feel better. °· For the next few days, avoid physical  activities that bring on chest pain. Continue physical activities as directed. °· Do not smoke. °· Avoid drinking alcohol. °· Only take over-the-counter or prescription medicine for pain, discomfort, or fever as directed by your caregiver. °· Follow your caregiver's suggestions for further testing if your chest pain does not go away. °· Keep any follow-up appointments you made. If you do not go to an appointment, you could develop lasting (chronic) problems with pain. If there is any problem keeping an appointment, you must call to reschedule. °SEEK MEDICAL CARE IF:  °· You think you are having problems from the medicine you are taking. Read your medicine instructions carefully. °· Your chest pain does not go away, even after treatment. °· You develop a rash with blisters on your chest. °SEEK IMMEDIATE MEDICAL CARE IF:  °· You have increased chest pain or pain that spreads to your arm, neck, jaw, back, or abdomen. °· You develop shortness of breath, an increasing cough, or you are coughing up blood. °· You have severe back or abdominal pain, feel nauseous, or vomit. °· You develop severe weakness, fainting, or chills. °· You have a fever. °THIS IS AN EMERGENCY. Do not wait to see if the pain will go away. Get medical help at once. Call your local emergency services (911 in [place] Do not drive yourself to the hospital. °MAKE SURE YOU:  °· Understand these instructions. °· Will watch your condition. °· Will get help right away if you are not doing well or get worse. °Document Released: 03/29/2005 Document Revised: 09/11/2011 Document Reviewed: 01/23/2008 °[employer]® Patient Information ©2014 [employer]   [employer]. ° °
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
## 2013-08-09 NOTE — Progress Notes (Signed)
Utilization Review completed.  
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
## 2013-08-09 NOTE — Progress Notes (Signed)
The MD was notified concerning the patient's self-catherisations and Lasix dosage at the beginning of the shift.  New orders were given to allow the patient to self-cath himself using our sterile equipment and to give him a 40 mg dose of po Lasix.  The RN carried out the orders.  He received Tylenol x1 during the night for pain in his neck.  He is refusing the heparin injection and stated that he was ready to go home several time overnight.  His VS remained stable and he did not have any acute changes overnight.

## 2013-08-11 ENCOUNTER — Ambulatory Visit (INDEPENDENT_AMBULATORY_CARE_PROVIDER_SITE_OTHER)
Admission: RE | Admit: 2013-08-11 | Discharge: 2013-08-11 | Disposition: A | Discharge status: Home / Self Care | Payer: Medicare Other | Source: Ambulatory Visit | Attending: Surgery | Admitting: Surgery

## 2013-08-11 ENCOUNTER — Ambulatory Visit (INDEPENDENT_AMBULATORY_CARE_PROVIDER_SITE_OTHER): Payer: Medicare Other | Admitting: Surgery

## 2013-08-11 ENCOUNTER — Other Ambulatory Visit: Payer: Self-pay | Admitting: Surgery

## 2013-08-11 ENCOUNTER — Encounter: Payer: Self-pay | Admitting: Surgery

## 2013-08-11 ENCOUNTER — Encounter: Payer: Medicare Other | Admitting: Surgery

## 2013-08-11 ENCOUNTER — Encounter (HOSPITAL_COMMUNITY): Payer: Medicare Other

## 2013-08-11 ENCOUNTER — Ambulatory Visit (HOSPITAL_COMMUNITY)
Admission: RE | Admit: 2013-08-11 | Discharge: 2013-08-11 | Disposition: A | Discharge status: Home / Self Care | Payer: Medicare Other | Source: Ambulatory Visit | Attending: Surgery | Admitting: Surgery

## 2013-08-11 VITALS — BP 151/67 | HR 83 | Ht 67.0 in | Wt 177.0 lb

## 2013-08-11 DIAGNOSIS — Z48812 Encounter for surgical aftercare following surgery on the circulatory system: Secondary | ICD-10-CM

## 2013-08-11 DIAGNOSIS — E119 Type 2 diabetes mellitus without complications: Secondary | ICD-10-CM | POA: Insufficient documentation

## 2013-08-11 DIAGNOSIS — I739 Peripheral vascular disease, unspecified: Secondary | ICD-10-CM

## 2013-08-11 DIAGNOSIS — L97909 Non-pressure chronic ulcer of unspecified part of unspecified lower leg with unspecified severity: Secondary | ICD-10-CM | POA: Insufficient documentation

## 2013-08-11 DIAGNOSIS — I10 Essential (primary) hypertension: Secondary | ICD-10-CM | POA: Insufficient documentation

## 2013-08-11 DIAGNOSIS — L98499 Non-pressure chronic ulcer of skin of other sites with unspecified severity: Principal | ICD-10-CM | POA: Insufficient documentation

## 2013-08-11 DIAGNOSIS — E785 Hyperlipidemia, unspecified: Secondary | ICD-10-CM | POA: Insufficient documentation
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
## 2013-08-11 NOTE — Addendum Note (Signed)
Addended by: Dorthula Rue L on: 08/11/2013 02:24 PM   Modules accepted: Orders

## 2013-08-15 ENCOUNTER — Other Ambulatory Visit: Payer: Self-pay | Admitting: Interventional Cardiology

## 2013-08-15 ENCOUNTER — Ambulatory Visit (INDEPENDENT_AMBULATORY_CARE_PROVIDER_SITE_OTHER): Payer: Medicare Other | Admitting: *Deleted

## 2013-08-15 DIAGNOSIS — I472 Ventricular tachycardia: Secondary | ICD-10-CM

## 2013-08-15 DIAGNOSIS — I2589 Other forms of chronic ischemic heart disease: Secondary | ICD-10-CM

## 2013-08-15 DIAGNOSIS — I255 Ischemic cardiomyopathy: Secondary | ICD-10-CM

## 2013-08-15 DIAGNOSIS — I4891 Unspecified atrial fibrillation: Secondary | ICD-10-CM

## 2013-08-15 DIAGNOSIS — I4819 Other persistent atrial fibrillation: Secondary | ICD-10-CM

## 2013-08-15 DIAGNOSIS — I5022 Chronic systolic (congestive) heart failure: Secondary | ICD-10-CM

## 2013-08-15 DIAGNOSIS — I4729 Other ventricular tachycardia: Secondary | ICD-10-CM

## 2013-08-18 LAB — MDC_IDC_ENUM_SESS_TYPE_REMOTE
Battery Remaining Longevity: 95 mo
Brady Statistic AP VP Percent: 0.61 %
Brady Statistic AP VS Percent: 0.03 %
Brady Statistic AS VP Percent: 71.8 %
Brady Statistic AS VS Percent: 27.56 %
Brady Statistic RV Percent Paced: 73.52 %
HIGH POWER IMPEDANCE MEASURED VALUE: 190 Ohm
HIGH POWER IMPEDANCE MEASURED VALUE: 44 Ohm
HighPow Impedance: 51 Ohm
Lead Channel Impedance Value: 4047 Ohm
Lead Channel Impedance Value: 513 Ohm
Lead Channel Pacing Threshold Amplitude: 0.5 V
Lead Channel Pacing Threshold Amplitude: 0.75 V
Lead Channel Pacing Threshold Amplitude: 0.875 V
Lead Channel Pacing Threshold Pulse Width: 0.4 ms
Lead Channel Pacing Threshold Pulse Width: 0.4 ms
Lead Channel Sensing Intrinsic Amplitude: 0.75 mV
Lead Channel Setting Pacing Amplitude: 2 V
Lead Channel Setting Pacing Amplitude: 2.25 V
Lead Channel Setting Pacing Pulse Width: 0.4 ms
MDC IDC MSMT BATTERY VOLTAGE: 2.98 V
MDC IDC MSMT LEADCHNL LV IMPEDANCE VALUE: 4047 Ohm
MDC IDC MSMT LEADCHNL RA IMPEDANCE VALUE: 551 Ohm
MDC IDC MSMT LEADCHNL RV IMPEDANCE VALUE: 418 Ohm
MDC IDC MSMT LEADCHNL RV PACING THRESHOLD PULSEWIDTH: 0.4 ms
MDC IDC MSMT LEADCHNL RV SENSING INTR AMPL: 10.625 mV
MDC IDC SESS DTM: 20150105143741
MDC IDC SET LEADCHNL LV PACING PULSEWIDTH: 0.4 ms
MDC IDC SET LEADCHNL RV PACING AMPLITUDE: 2.5 V
MDC IDC SET LEADCHNL RV SENSING SENSITIVITY: 0.3 mV
MDC IDC SET ZONE DETECTION INTERVAL: 460 ms
MDC IDC STAT BRADY RA PERCENT PACED: 0.64 %
Zone Setting Detection Interval: 300 ms
Zone Setting Detection Interval: 340 ms
Zone Setting Detection Interval: 350 ms
Zone Setting Detection Interval: 430 ms

## 2013-08-20 ENCOUNTER — Telehealth: Payer: Self-pay | Admitting: Pulmonary Disease

## 2013-08-20 ENCOUNTER — Encounter: Payer: Self-pay | Admitting: *Deleted

## 2013-08-20 NOTE — Telephone Encounter (Signed)
Dr. Gwenette Greet note does not state what liter flow pt needs to be on. Sats in EPIC are from 2012. [place] please advise pt liter flow? thanks

## 2013-08-20 NOTE — Telephone Encounter (Signed)
Last ov note pt's sats 97% RA No mention of supplemental o2 use  [name], do you have the form that [facility] needs to sign? Looks like they have faxed several times  Please advise

## 2013-08-20 NOTE — Telephone Encounter (Signed)
See note.  He was just in office

## 2013-08-21 NOTE — Telephone Encounter (Signed)
This form was given to Dr Gwenette Greet by [facility]--not on [facility] desk any longer. Will check with [facility] on progress of form for he has returned this back to [place].

## 2013-08-21 NOTE — Telephone Encounter (Signed)
Signed order faxed to [facility] @ [phone] .Verdie Mosher

## 2013-08-21 NOTE — Telephone Encounter (Signed)
[name] from [employer] he hasn't hear from anyone in this office.  Would like to know why he hasn't heard anything.  States that this has now been faxed twice & again-heard nothing from [place].  Please call.  Satira Anis

## 2013-08-21 NOTE — Telephone Encounter (Signed)
Per Rosalee Kaufman has form on desk. Please advise once done [facility] thanks

## 2013-08-27 ENCOUNTER — Encounter: Payer: Self-pay | Admitting: *Deleted

## 2013-09-01 ENCOUNTER — Other Ambulatory Visit: Payer: Self-pay | Admitting: Pulmonary Disease

## 2013-09-02 ENCOUNTER — Ambulatory Visit (HOSPITAL_BASED_OUTPATIENT_CLINIC_OR_DEPARTMENT_OTHER): Payer: Medicare Other | Admitting: Oncology

## 2013-09-02 ENCOUNTER — Ambulatory Visit: Payer: Medicare Other | Admitting: Oncology

## 2013-09-02 ENCOUNTER — Other Ambulatory Visit (HOSPITAL_BASED_OUTPATIENT_CLINIC_OR_DEPARTMENT_OTHER): Payer: Medicare Other

## 2013-09-02 VITALS — BP 141/54 | HR 88 | Temp 97.9°F | Resp 18 | Ht 67.0 in | Wt 178.6 lb

## 2013-09-02 DIAGNOSIS — I739 Peripheral vascular disease, unspecified: Secondary | ICD-10-CM

## 2013-09-02 DIAGNOSIS — D649 Anemia, unspecified: Secondary | ICD-10-CM

## 2013-09-02 DIAGNOSIS — Z85038 Personal history of other malignant neoplasm of large intestine: Secondary | ICD-10-CM

## 2013-09-02 DIAGNOSIS — L89109 Pressure ulcer of unspecified part of back, unspecified stage: Secondary | ICD-10-CM

## 2013-09-02 DIAGNOSIS — J449 Chronic obstructive pulmonary disease, unspecified: Secondary | ICD-10-CM

## 2013-09-02 DIAGNOSIS — L8990 Pressure ulcer of unspecified site, unspecified stage: Secondary | ICD-10-CM

## 2013-09-02 LAB — CBC WITH DIFFERENTIAL/PLATELET
BASO%: 0.3 % (ref 0.0–2.0)
BASOS ABS: 0 10*3/uL (ref 0.0–0.1)
EOS ABS: 0 10*3/uL (ref 0.0–0.5)
EOS%: 0.6 % (ref 0.0–7.0)
HEMATOCRIT: 30.4 % — AB (ref 38.4–49.9)
HEMOGLOBIN: 9.8 g/dL — AB (ref 13.0–17.1)
LYMPH%: 6.5 % — AB (ref 14.0–49.0)
MCH: 29.8 pg (ref 27.2–33.4)
MCHC: 32.3 g/dL (ref 32.0–36.0)
MCV: 92.2 fL (ref 79.3–98.0)
MONO#: 0.5 10*3/uL (ref 0.1–0.9)
MONO%: 5.9 % (ref 0.0–14.0)
NEUT%: 86.7 % — AB (ref 39.0–75.0)
NEUTROS ABS: 7.3 10*3/uL — AB (ref 1.5–6.5)
PLATELETS: 173 10*3/uL (ref 140–400)
RBC: 3.29 10*6/uL — ABNORMAL LOW (ref 4.20–5.82)
RDW: 15.5 % — ABNORMAL HIGH (ref 11.0–14.6)
WBC: 8.5 10*3/uL (ref 4.0–10.3)
lymph#: 0.5 10*3/uL — ABNORMAL LOW (ref 0.9–3.3)
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
## 2013-09-03 ENCOUNTER — Telehealth: Payer: Self-pay | Admitting: Oncology

## 2013-09-03 NOTE — Telephone Encounter (Signed)
s.w. pt and advised on June appt....pt ok and aware °

## 2013-09-04 LAB — PROTEIN ELECTROPHORESIS, SERUM
ALPHA-1-GLOBULIN: 6.5 % — AB (ref 2.9–4.9)
ALPHA-2-GLOBULIN: 12.9 % — AB (ref 7.1–11.8)
Albumin ELP: 51 % — ABNORMAL LOW (ref 55.8–66.1)
BETA 2: 3.1 % — AB (ref 3.2–6.5)
Beta Globulin: 6 % (ref 4.7–7.2)
Gamma Globulin: 20.5 % — ABNORMAL HIGH (ref 11.1–18.8)
M-Spike, %: 1.12 g/dL
Total Protein, Serum Electrophoresis: 7.1 g/dL (ref 6.0–8.3)

## 2013-09-04 LAB — IGM: IGM, SERUM: 1480 mg/dL — AB (ref 41–251)

## 2013-09-05 ENCOUNTER — Encounter (HOSPITAL_BASED_OUTPATIENT_CLINIC_OR_DEPARTMENT_OTHER): Payer: Medicare Other | Attending: General Surgery

## 2013-09-05 DIAGNOSIS — L89109 Pressure ulcer of unspecified part of back, unspecified stage: Secondary | ICD-10-CM | POA: Insufficient documentation

## 2013-09-05 DIAGNOSIS — L8992 Pressure ulcer of unspecified site, stage 2: Secondary | ICD-10-CM | POA: Insufficient documentation

## 2013-09-15 ENCOUNTER — Encounter: Payer: Self-pay | Admitting: Internal Medicine

## 2013-09-17 ENCOUNTER — Encounter: Payer: Self-pay | Admitting: Interventional Cardiology

## 2013-09-29 ENCOUNTER — Encounter: Payer: Self-pay | Admitting: *Deleted

## 2013-10-03 ENCOUNTER — Encounter (HOSPITAL_BASED_OUTPATIENT_CLINIC_OR_DEPARTMENT_OTHER): Payer: Medicare Other | Attending: General Surgery

## 2013-10-03 DIAGNOSIS — L89109 Pressure ulcer of unspecified part of back, unspecified stage: Secondary | ICD-10-CM | POA: Insufficient documentation

## 2013-10-03 DIAGNOSIS — L8992 Pressure ulcer of unspecified site, stage 2: Secondary | ICD-10-CM | POA: Insufficient documentation

## 2013-10-13 ENCOUNTER — Ambulatory Visit (INDEPENDENT_AMBULATORY_CARE_PROVIDER_SITE_OTHER): Payer: Medicare Other | Admitting: *Deleted

## 2013-10-13 ENCOUNTER — Encounter: Payer: Self-pay | Admitting: *Deleted

## 2013-10-13 DIAGNOSIS — I5022 Chronic systolic (congestive) heart failure: Secondary | ICD-10-CM

## 2013-10-13 DIAGNOSIS — Z950 Presence of cardiac pacemaker: Secondary | ICD-10-CM

## 2013-10-13 NOTE — Progress Notes (Signed)
EPIC Encounter for ICM Monitoring  Patient Name: [name] is a 78 y.o. male Date: 10/13/2013 Primary Care Physican: Horton Finer, MD Primary Cardiologist: Tamala Julian Electrophysiologist: Lovena Le Dry Weight: 175 lbs       In the past month, have you:  1. Gained more than 2 pounds in a day or more than 5 pounds in a week? no  2. Had changes in your medications (with verification of current medications)? Yes. PCP started him on Lyrica 100 mg daily.  3. Had more shortness of breath than is usual for you? no  4. Limited your activity because of shortness of breath? no  5. Not been able to sleep because of shortness of breath? no  6. Had increased swelling in your feet or ankles? no  7. Had symptoms of dehydration (dizziness, dry mouth, increased thirst, decreased urine output) no  8. Had changes in sodium restriction? no  9. Been compliant with medication? Yes   ICM trend:   Follow-up plan: ICM clinic phone appointment: 11/17/13 (full transmission)  Copy of note sent to patient's primary care physician, primary cardiologist, and device following physician.  Emily Filbert, RN, BSN 10/13/2013 5:44 PM

## 2013-10-27 ENCOUNTER — Other Ambulatory Visit: Payer: Self-pay | Admitting: Pulmonary Disease

## 2013-10-30 ENCOUNTER — Ambulatory Visit: Payer: Medicare Other | Admitting: Interventional Cardiology

## 2013-10-31 ENCOUNTER — Encounter (HOSPITAL_BASED_OUTPATIENT_CLINIC_OR_DEPARTMENT_OTHER): Payer: Medicare Other | Attending: General Surgery

## 2013-10-31 DIAGNOSIS — L8992 Pressure ulcer of unspecified site, stage 2: Secondary | ICD-10-CM | POA: Insufficient documentation

## 2013-10-31 DIAGNOSIS — L89109 Pressure ulcer of unspecified part of back, unspecified stage: Secondary | ICD-10-CM | POA: Insufficient documentation

## 2013-11-16 ENCOUNTER — Encounter: Payer: Self-pay | Admitting: Internal Medicine

## 2013-11-16 LAB — MDC_IDC_ENUM_SESS_TYPE_REMOTE
Battery Remaining Longevity: 90 mo
Battery Voltage: 2.98 V
Brady Statistic AP VP Percent: 0.6 %
Brady Statistic AS VP Percent: 81.18 %
Brady Statistic RA Percent Paced: 0.63 %
Brady Statistic RV Percent Paced: 82.22 %
Date Time Interrogation Session: 20150517211533
HIGH POWER IMPEDANCE MEASURED VALUE: 228 Ohm
HighPow Impedance: 45 Ohm
HighPow Impedance: 52 Ohm
Lead Channel Impedance Value: 4047 Ohm
Lead Channel Impedance Value: 418 Ohm
Lead Channel Pacing Threshold Amplitude: 0.625 V
Lead Channel Pacing Threshold Amplitude: 0.75 V
Lead Channel Pacing Threshold Pulse Width: 0.4 ms
Lead Channel Setting Pacing Amplitude: 2 V
Lead Channel Setting Pacing Amplitude: 2.25 V
Lead Channel Setting Pacing Amplitude: 2.5 V
Lead Channel Setting Pacing Pulse Width: 0.4 ms
Lead Channel Setting Sensing Sensitivity: 0.3 mV
MDC IDC MSMT LEADCHNL LV IMPEDANCE VALUE: 4047 Ohm
MDC IDC MSMT LEADCHNL LV IMPEDANCE VALUE: 475 Ohm
MDC IDC MSMT LEADCHNL LV PACING THRESHOLD PULSEWIDTH: 0.4 ms
MDC IDC MSMT LEADCHNL RA IMPEDANCE VALUE: 532 Ohm
MDC IDC MSMT LEADCHNL RA SENSING INTR AMPL: 0.875 mV
MDC IDC MSMT LEADCHNL RV SENSING INTR AMPL: 10 mV
MDC IDC SET LEADCHNL RV PACING PULSEWIDTH: 0.4 ms
MDC IDC SET ZONE DETECTION INTERVAL: 300 ms
MDC IDC SET ZONE DETECTION INTERVAL: 350 ms
MDC IDC STAT BRADY AP VS PERCENT: 0.03 %
MDC IDC STAT BRADY AS VS PERCENT: 18.19 %
Zone Setting Detection Interval: 340 ms
Zone Setting Detection Interval: 430 ms
Zone Setting Detection Interval: 460 ms

## 2013-11-17 ENCOUNTER — Ambulatory Visit (INDEPENDENT_AMBULATORY_CARE_PROVIDER_SITE_OTHER): Payer: Medicare Other | Admitting: Interventional Cardiology

## 2013-11-17 ENCOUNTER — Ambulatory Visit (INDEPENDENT_AMBULATORY_CARE_PROVIDER_SITE_OTHER): Payer: Medicare Other | Admitting: *Deleted

## 2013-11-17 ENCOUNTER — Encounter: Payer: Self-pay | Admitting: *Deleted

## 2013-11-17 ENCOUNTER — Telehealth: Payer: Self-pay | Admitting: Interventional Cardiology

## 2013-11-17 ENCOUNTER — Encounter: Payer: Self-pay | Admitting: Interventional Cardiology

## 2013-11-17 VITALS — BP 124/48 | HR 96 | Ht 67.0 in | Wt 173.0 lb

## 2013-11-17 DIAGNOSIS — I5022 Chronic systolic (congestive) heart failure: Secondary | ICD-10-CM

## 2013-11-17 DIAGNOSIS — I2589 Other forms of chronic ischemic heart disease: Secondary | ICD-10-CM

## 2013-11-17 DIAGNOSIS — I255 Ischemic cardiomyopathy: Secondary | ICD-10-CM

## 2013-11-17 DIAGNOSIS — J438 Other emphysema: Secondary | ICD-10-CM

## 2013-11-17 DIAGNOSIS — I2510 Atherosclerotic heart disease of native coronary artery without angina pectoris: Secondary | ICD-10-CM

## 2013-11-17 DIAGNOSIS — Z950 Presence of cardiac pacemaker: Secondary | ICD-10-CM

## 2013-11-17 DIAGNOSIS — I10 Essential (primary) hypertension: Secondary | ICD-10-CM

## 2013-11-17 DIAGNOSIS — J439 Emphysema, unspecified: Secondary | ICD-10-CM

## 2013-11-17 NOTE — Progress Notes (Signed)
EPIC Encounter for ICM Monitoring  Patient Name: [name] is a 78 y.o. male Date: 11/17/2013 Primary Care Physican: Horton Finer, MD Primary Cardiologist: Tamala Julian Electrophysiologist: Lovena Le Dry Weight: 175 lbs       In the past month, have you:  1. Gained more than 2 pounds in a day or more than 5 pounds in a week? no  2. Had changes in your medications (with verification of current medications)? Yes. Stopped Lyrica.  3. Had more shortness of breath than is usual for you? no  4. Limited your activity because of shortness of breath? no  5. Not been able to sleep because of shortness of breath? no  6. Had increased swelling in your feet or ankles? no  7. Had symptoms of dehydration (dizziness, dry mouth, increased thirst, decreased urine output) no  8. Had changes in sodium restriction? no  9. Been compliant with medication? Yes   ICM trend:   Follow-up plan: ICM clinic phone appointment: 12/18/13. The patient has follow up with Dr. Tamala Julian later today.  Copy of note sent to patient's primary care physician, primary cardiologist, and device following physician.  Emily Filbert, RN, BSN 11/17/2013 9:59 AM

## 2013-11-17 NOTE — Patient Instructions (Signed)
Your physician recommends that you continue on your current medications as directed. Please refer to the Current Medication list given to you today.  Your physician wants you to follow-up in: 1 year. You will receive a reminder letter in the mail two months in advance. If you don't receive a letter, please call our office to schedule the follow-up appointment.  

## 2013-11-17 NOTE — Telephone Encounter (Signed)
The patient is having diaphragm pacing when he is recumbent. It goes away with sitting.

## 2013-11-17 NOTE — Progress Notes (Signed)
Remote ICD transmission.   

## 2013-11-17 NOTE — Telephone Encounter (Signed)
I saw [name] for routine cardiac followup. In the recumbent position he gets diaphragm pacing. When he sits, it goes away. I thought she might like to know this and perhaps his device needs to be reevaluated or reprogrammed.
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
## 2013-11-24 NOTE — Telephone Encounter (Signed)
He will need a followup in the device clinic and attempt to reprogram to avoid diaphragmatic stimulation. GT

## 2013-11-28 ENCOUNTER — Encounter: Payer: Self-pay | Admitting: Cardiology

## 2013-12-03 ENCOUNTER — Ambulatory Visit (INDEPENDENT_AMBULATORY_CARE_PROVIDER_SITE_OTHER): Payer: Medicare Other | Admitting: Pulmonary Disease

## 2013-12-03 ENCOUNTER — Encounter: Payer: Self-pay | Admitting: Pulmonary Disease

## 2013-12-03 VITALS — BP 132/56 | HR 80 | Temp 97.3°F | Ht 67.0 in | Wt 168.8 lb

## 2013-12-03 DIAGNOSIS — J9610 Chronic respiratory failure, unspecified whether with hypoxia or hypercapnia: Secondary | ICD-10-CM

## 2013-12-03 DIAGNOSIS — J439 Emphysema, unspecified: Secondary | ICD-10-CM

## 2013-12-03 DIAGNOSIS — I2510 Atherosclerotic heart disease of native coronary artery without angina pectoris: Secondary | ICD-10-CM

## 2013-12-03 DIAGNOSIS — J438 Other emphysema: Secondary | ICD-10-CM

## 2013-12-03 NOTE — Progress Notes (Signed)
   Subjective:    Patient ID: [name], male    DOB: 07-Jun-1936, 78 y.o.   MRN: [id number]  HPI The patient comes in today for followup of his known COPD with chronic respiratory failure. He is done very well since the last visit, with no acute exacerbation her chest infection. He is maintaining on his bronchodilator regimen, and feels that his exertional tolerance is at baseline. He denies any congestion or increased cough.   Review of Systems  Constitutional: Negative for fever and unexpected weight change.  HENT: Positive for congestion, postnasal drip and rhinorrhea. Negative for dental problem, ear pain, nosebleeds, sinus pressure, sneezing, sore throat and trouble swallowing.   Eyes: Negative for redness and itching.  Respiratory: Negative for cough, chest tightness, shortness of breath and wheezing.   Cardiovascular: Negative for palpitations and leg swelling.  Gastrointestinal: Negative for nausea and vomiting.  Genitourinary: Negative for dysuria.  Musculoskeletal: Negative for joint swelling.  Skin: Negative for rash.  Neurological: Negative for headaches.  Hematological: Does not bruise/bleed easily.  Psychiatric/Behavioral: Negative for dysphoric mood. The patient is not nervous/anxious.        Objective:   Physical Exam Wd male in nad Nose without purulence or d/c noted. Neck without LN or TMG Chest with mildly decreased bs, no wheezing or crackles. Cor with rrr, 2/6 sem LE with mild edema, no cyanosis Alert and oriented, moves all 4.        Assessment & Plan:

## 2013-12-03 NOTE — Patient Instructions (Signed)
No change in breathing medications. Stay as active as possible. followup with me again in 6mos.  

## 2013-12-03 NOTE — Assessment & Plan Note (Signed)
The patient is doing very well on his current regimen, with no acute exacerbation or infection. His exertional tolerance is at baseline, and he is satisfied with his current breathing status. I've asked him to continue on his current medications, and to continue to stay as active as possible. I will see him back again in 6 months.

## 2013-12-05 ENCOUNTER — Other Ambulatory Visit: Payer: Self-pay | Admitting: Internal Medicine

## 2013-12-05 ENCOUNTER — Encounter (HOSPITAL_BASED_OUTPATIENT_CLINIC_OR_DEPARTMENT_OTHER): Payer: Medicare Other | Attending: General Surgery

## 2013-12-05 DIAGNOSIS — L89109 Pressure ulcer of unspecified part of back, unspecified stage: Secondary | ICD-10-CM | POA: Insufficient documentation

## 2013-12-05 DIAGNOSIS — L8992 Pressure ulcer of unspecified site, stage 2: Secondary | ICD-10-CM | POA: Insufficient documentation

## 2013-12-12 NOTE — Telephone Encounter (Signed)
Appt made for 12/16/13 to test other vectors.

## 2013-12-16 ENCOUNTER — Ambulatory Visit (INDEPENDENT_AMBULATORY_CARE_PROVIDER_SITE_OTHER): Payer: Medicare Other | Admitting: *Deleted

## 2013-12-16 ENCOUNTER — Encounter: Payer: Self-pay | Admitting: Internal Medicine

## 2013-12-16 DIAGNOSIS — T82897A Other specified complication of cardiac prosthetic devices, implants and grafts, initial encounter: Secondary | ICD-10-CM

## 2013-12-16 NOTE — Progress Notes (Signed)
Pt having intermittent diaphragmatic stimulation when recumbent. Attempted to recreate by changing output on RV & LV leads to 8.0V@1 .20ms w/ pt laying on side in recliner but only achieved intermittent success.  Changed LV vector from LV tip>>RV coil to LV ring>>RV coil. Follow up as planned: ICM clinic and ROV w/ Dr. Lovena Le 02/05/14.

## 2013-12-18 ENCOUNTER — Ambulatory Visit (INDEPENDENT_AMBULATORY_CARE_PROVIDER_SITE_OTHER): Payer: Medicare Other | Admitting: *Deleted

## 2013-12-18 ENCOUNTER — Encounter: Payer: Self-pay | Admitting: *Deleted

## 2013-12-18 DIAGNOSIS — I5022 Chronic systolic (congestive) heart failure: Secondary | ICD-10-CM

## 2013-12-18 DIAGNOSIS — Z950 Presence of cardiac pacemaker: Secondary | ICD-10-CM

## 2013-12-18 NOTE — Progress Notes (Signed)
EPIC Encounter for ICM Monitoring  Patient Name: [name] is a 78 y.o. male Date: 12/18/2013 Primary Care Physican: Horton Finer, MD Primary Cardiologist: Tamala Julian Electrophysiologist: Lovena Le Dry Weight: 175 lbs       In the past month, have you:  1. Gained more than 2 pounds in a day or more than 5 pounds in a week? no  2. Had changes in your medications (with verification of current medications)? no  3. Had more shortness of breath than is usual for you? no  4. Limited your activity because of shortness of breath? no  5. Not been able to sleep because of shortness of breath? no  6. Had increased swelling in your feet or ankles? no  7. Had symptoms of dehydration (dizziness, dry mouth, increased thirst, decreased urine output) no  8. Had changes in sodium restriction? no  9. Been compliant with medication? Yes  ** Lead alert noted on transmission. LV impedence > 3000 ohms. Patient was in the office on 6/16 for reprogramming due to diaphragmatic stimulation. Per Dr. Lovena Le, he would like the patient to be brought back in for reprogramming again. Discussed with Juanda Crumble in the device room, ok to offer appointment tomorrow at 9:30 am. The patient is very displeased with having to come back in and states he cannot come tomorrow. He will come on Monday 6/22 at 2:15 pm. **   ICM trend:   Follow-up plan: ICM clinic phone appointment: 01/19/14  Copy of note sent to patient's primary care physician, primary cardiologist, and device following physician.  Alvis Lemmings, RN, BSN 12/18/2013 11:54 AM

## 2013-12-22 ENCOUNTER — Ambulatory Visit (INDEPENDENT_AMBULATORY_CARE_PROVIDER_SITE_OTHER): Payer: Medicare Other | Admitting: *Deleted

## 2013-12-22 ENCOUNTER — Encounter: Payer: Self-pay | Admitting: Internal Medicine

## 2013-12-22 DIAGNOSIS — I5022 Chronic systolic (congestive) heart failure: Secondary | ICD-10-CM

## 2013-12-22 DIAGNOSIS — I472 Ventricular tachycardia: Secondary | ICD-10-CM

## 2013-12-22 DIAGNOSIS — I255 Ischemic cardiomyopathy: Secondary | ICD-10-CM

## 2013-12-22 DIAGNOSIS — I4729 Other ventricular tachycardia: Secondary | ICD-10-CM

## 2013-12-22 DIAGNOSIS — I2589 Other forms of chronic ischemic heart disease: Secondary | ICD-10-CM

## 2013-12-22 LAB — MDC_IDC_ENUM_SESS_TYPE_INCLINIC
Battery Voltage: 2.98 V
Brady Statistic AP VP Percent: 0.56 %
Brady Statistic AP VP Percent: 0.42 %
Brady Statistic AP VS Percent: 0.06 %
Brady Statistic AS VP Percent: 76.45 %
Brady Statistic AS VP Percent: 66.17 %
Brady Statistic AS VS Percent: 22.96 %
Brady Statistic AS VS Percent: 33.35 %
Brady Statistic RA Percent Paced: 0.59 %
Brady Statistic RA Percent Paced: 0.48 %
HIGH POWER IMPEDANCE MEASURED VALUE: 50 Ohm
HIGH POWER IMPEDANCE MEASURED VALUE: 49 Ohm
HIGH POWER IMPEDANCE MEASURED VALUE: 55 Ohm
HighPow Impedance: 228 Ohm
HighPow Impedance: 59 Ohm
HighPow Impedance: 190 Ohm
Lead Channel Impedance Value: 4047 Ohm
Lead Channel Impedance Value: 4047 Ohm
Lead Channel Impedance Value: 475 Ohm
Lead Channel Impedance Value: 4047 Ohm
Lead Channel Impedance Value: 4047 Ohm
Lead Channel Impedance Value: 418 Ohm
Lead Channel Impedance Value: 475 Ohm
Lead Channel Pacing Threshold Amplitude: 0.5 V
Lead Channel Pacing Threshold Amplitude: 1.125 V
Lead Channel Pacing Threshold Amplitude: 0.5 V
Lead Channel Pacing Threshold Pulse Width: 0.4 ms
Lead Channel Pacing Threshold Pulse Width: 0.4 ms
Lead Channel Pacing Threshold Pulse Width: 0.4 ms
Lead Channel Sensing Intrinsic Amplitude: 0.875 mV
Lead Channel Sensing Intrinsic Amplitude: 0.875 mV
Lead Channel Sensing Intrinsic Amplitude: 12.125 mV
Lead Channel Sensing Intrinsic Amplitude: 0.625 mV
Lead Channel Sensing Intrinsic Amplitude: 23.125 mV
Lead Channel Sensing Intrinsic Amplitude: 23.125 mV
Lead Channel Setting Pacing Amplitude: 2 V
Lead Channel Setting Pacing Amplitude: 2.25 V
Lead Channel Setting Pacing Amplitude: 2 V
Lead Channel Setting Pacing Pulse Width: 0.4 ms
Lead Channel Setting Pacing Pulse Width: 0.4 ms
Lead Channel Setting Sensing Sensitivity: 0.3 mV
MDC IDC MSMT BATTERY REMAINING LONGEVITY: 102 mo
MDC IDC MSMT BATTERY REMAINING LONGEVITY: 89 mo
MDC IDC MSMT BATTERY VOLTAGE: 2.96 V
MDC IDC MSMT LEADCHNL LV IMPEDANCE VALUE: 551 Ohm
MDC IDC MSMT LEADCHNL LV PACING THRESHOLD AMPLITUDE: 1.125 V
MDC IDC MSMT LEADCHNL LV PACING THRESHOLD PULSEWIDTH: 0.4 ms
MDC IDC MSMT LEADCHNL RA IMPEDANCE VALUE: 551 Ohm
MDC IDC MSMT LEADCHNL RA IMPEDANCE VALUE: 532 Ohm
MDC IDC MSMT LEADCHNL RA PACING THRESHOLD PULSEWIDTH: 0.4 ms
MDC IDC MSMT LEADCHNL RA SENSING INTR AMPL: 0.625 mV
MDC IDC MSMT LEADCHNL RV PACING THRESHOLD AMPLITUDE: 0.75 V
MDC IDC MSMT LEADCHNL RV PACING THRESHOLD AMPLITUDE: 0.625 V
MDC IDC MSMT LEADCHNL RV PACING THRESHOLD PULSEWIDTH: 0.4 ms
MDC IDC MSMT LEADCHNL RV SENSING INTR AMPL: 12.125 mV
MDC IDC SESS DTM: 20150616175704
MDC IDC SESS DTM: 20150622162103
MDC IDC SET LEADCHNL LV PACING AMPLITUDE: 2.25 V
MDC IDC SET LEADCHNL LV PACING PULSEWIDTH: 0.4 ms
MDC IDC SET LEADCHNL LV PACING PULSEWIDTH: 0.4 ms
MDC IDC SET LEADCHNL RV PACING AMPLITUDE: 2.5 V
MDC IDC SET LEADCHNL RV PACING AMPLITUDE: 2.5 V
MDC IDC SET LEADCHNL RV SENSING SENSITIVITY: 0.3 mV
MDC IDC SET ZONE DETECTION INTERVAL: 350 ms
MDC IDC SET ZONE DETECTION INTERVAL: 460 ms
MDC IDC SET ZONE DETECTION INTERVAL: 340 ms
MDC IDC SET ZONE DETECTION INTERVAL: 350 ms
MDC IDC SET ZONE DETECTION INTERVAL: 460 ms
MDC IDC STAT BRADY AP VS PERCENT: 0.03 %
MDC IDC STAT BRADY RV PERCENT PACED: 77.88 %
MDC IDC STAT BRADY RV PERCENT PACED: 68.05 %
Zone Setting Detection Interval: 300 ms
Zone Setting Detection Interval: 340 ms
Zone Setting Detection Interval: 430 ms
Zone Setting Detection Interval: 300 ms
Zone Setting Detection Interval: 430 ms

## 2013-12-22 NOTE — Progress Notes (Signed)
CRT-D check in office for LV reprogramming only (N/C). LV lead impedance has been measuring at >3000 ohms since config changed from [place] to [place]. The change was due to Diaph. Stim. Patient states that he never had an issue with the stimulation, and only noticed it while lying in one position. LV config reprogrammed to original settings. Threshold and impedance tested prior to pt leaving ofc. Both were WNL. Patient to call if DS becomes more noticeable (currently at +1 safety margin). Patient to F/U w/ GT as scheduled.

## 2013-12-30 ENCOUNTER — Ambulatory Visit (HOSPITAL_BASED_OUTPATIENT_CLINIC_OR_DEPARTMENT_OTHER): Payer: Medicare Other | Admitting: Oncology

## 2013-12-30 ENCOUNTER — Other Ambulatory Visit (HOSPITAL_BASED_OUTPATIENT_CLINIC_OR_DEPARTMENT_OTHER): Payer: Medicare Other

## 2013-12-30 VITALS — BP 125/46 | HR 94 | Temp 98.4°F | Resp 20 | Ht 67.0 in | Wt 163.4 lb

## 2013-12-30 DIAGNOSIS — D649 Anemia, unspecified: Secondary | ICD-10-CM

## 2013-12-30 DIAGNOSIS — D472 Monoclonal gammopathy: Secondary | ICD-10-CM

## 2013-12-30 DIAGNOSIS — I2510 Atherosclerotic heart disease of native coronary artery without angina pectoris: Secondary | ICD-10-CM

## 2013-12-30 LAB — CBC WITH DIFFERENTIAL/PLATELET
BASO%: 0.2 % (ref 0.0–2.0)
BASOS ABS: 0 10*3/uL (ref 0.0–0.1)
EOS ABS: 0 10*3/uL (ref 0.0–0.5)
EOS%: 0.5 % (ref 0.0–7.0)
HEMATOCRIT: 33.1 % — AB (ref 38.4–49.9)
HEMOGLOBIN: 10.6 g/dL — AB (ref 13.0–17.1)
LYMPH#: 0.8 10*3/uL — AB (ref 0.9–3.3)
LYMPH%: 9 % — ABNORMAL LOW (ref 14.0–49.0)
MCH: 28.9 pg (ref 27.2–33.4)
MCHC: 32.1 g/dL (ref 32.0–36.0)
MCV: 90.1 fL (ref 79.3–98.0)
MONO#: 1 10*3/uL — ABNORMAL HIGH (ref 0.1–0.9)
MONO%: 11.5 % (ref 0.0–14.0)
NEUT#: 6.7 10*3/uL — ABNORMAL HIGH (ref 1.5–6.5)
NEUT%: 78.8 % — ABNORMAL HIGH (ref 39.0–75.0)
Platelets: 172 10*3/uL (ref 140–400)
RBC: 3.67 10*6/uL — ABNORMAL LOW (ref 4.20–5.82)
RDW: 16.1 % — AB (ref 11.0–14.6)
WBC: 8.6 10*3/uL (ref 4.0–10.3)

## 2013-12-30 NOTE — Progress Notes (Signed)
  [facility] OFFICE PROGRESS NOTE   Diagnosis: Monoclonal protein  INTERVAL HISTORY:   Mr. Leask returns as scheduled. He continues followup at the wound clinic for treatment of sacral ulcers. He reports right leg ulcers haven't healed. No recent infection. No new complaint.  Objective:  Vital signs in last 24 hours:  There were no vitals taken for this visit.    HEENT: Neck without mass Lymphatics: No cervical, supraclavicular, axillary, or inguinal nodes Resp: Distant breath sounds, no respiratory distress Cardio: Irregular GI: No hepatosplenomegaly, nontender Vascular: Trace pitting edema at the left and right lower leg with a rapid in place at the right leg  Skin: Dressing over the decubitus ulcers     Lab Results:  Lab Results  Component Value Date   WBC 8.6 12/30/2013   HGB 10.6* 12/30/2013   HCT 33.1* 12/30/2013   MCV 90.1 12/30/2013   PLT 172 12/30/2013   NEUTROABS 6.7* 12/30/2013   09/02/2013-serum M spike 1.12, IgM 1480 Imaging:  No results found.  Medications: I have reviewed the patient's current medications.  Assessment/Plan: 1. Normocytic anemia-bone marrow biopsy on 12/24/2012 with a slightly hypercellular marrow and no evidence of a lymphoproliferative disorder, negative myeloma fish panel. The hemoglobin is stable.  2. Serum monoclonal IgM kappa protein  3. Low IgG and IgA levels  4. Oxygen dependent COPD  5. History of coronary artery disease  6. History of congestive heart failure  7. Peripheral vascular disease, status post femoral artery bypass surgery September 2014  8. Defibrillator  9. Colon cancer 2005-stage II (T3 N0)  10. CT of the abdomen 08/30/2009-upper pole left kidney lesion measuring 12 x 12 mm-indeterminate, increased from 2008 and 2010, felt to be a cyst on the CT 01/08/2013 , consistent with a cyst on a CT 01/08/2013 with other indeterminate small renal lesions  11. Small abdominal lymph nodes on the CT  01/08/2013-not significant change compared to a CT from 08/30/2009  12. Sacral decubitus ulcers-managed at the wound clinic     Disposition:  The hemoglobin is improved. There is no clinical evidence for progression to multiple myeloma or another lymphoproliferative disorder. We will followup on the IgM level and serum M spike from today. He will stay up-to-date on the pneumococcal an influenza vaccines. Mr. Bachmeier return for an office and lab visit in 8 months.  Betsy Coder, MD  12/30/2013  10:00 AM

## 2014-01-01 ENCOUNTER — Telehealth: Payer: Self-pay | Admitting: Oncology

## 2014-01-01 LAB — KAPPA/LAMBDA LIGHT CHAINS
KAPPA LAMBDA RATIO: 13.64 — AB (ref 0.26–1.65)
Kappa free light chain: 17.6 mg/dL — ABNORMAL HIGH (ref 0.33–1.94)
LAMBDA FREE LGHT CHN: 1.29 mg/dL (ref 0.57–2.63)

## 2014-01-01 LAB — PROTEIN ELECTROPHORESIS, SERUM
ALBUMIN ELP: 49 % — AB (ref 55.8–66.1)
ALPHA-1-GLOBULIN: 7.8 % — AB (ref 2.9–4.9)
ALPHA-2-GLOBULIN: 14.2 % — AB (ref 7.1–11.8)
Beta 2: 3 % — ABNORMAL LOW (ref 3.2–6.5)
Beta Globulin: 5.9 % (ref 4.7–7.2)
Gamma Globulin: 20.1 % — ABNORMAL HIGH (ref 11.1–18.8)
M-Spike, %: 1.01 g/dL
TOTAL PROTEIN, SERUM ELECTROPHOR: 6.7 g/dL (ref 6.0–8.3)

## 2014-01-01 LAB — IGM: IgM, Serum: 1400 mg/dL — ABNORMAL HIGH (ref 41–251)

## 2014-01-01 NOTE — Telephone Encounter (Signed)
s/w pt re appt for 08/27/14

## 2014-01-05 ENCOUNTER — Telehealth: Payer: Self-pay | Admitting: *Deleted

## 2014-01-05 NOTE — Telephone Encounter (Signed)
Called pt with lab results. IgM and M-Spike are stable. He voiced understanding.

## 2014-01-05 NOTE — Telephone Encounter (Signed)
Message copied by Brien Few on Mon Jan 05, 2014 12:41 PM ------      Message from: Betsy Coder B      Created: Fri Jan 02, 2014  7:00 PM       Please call patient, IgM and M-spike are stable, f/u as scheduled ------

## 2014-01-09 ENCOUNTER — Encounter (HOSPITAL_BASED_OUTPATIENT_CLINIC_OR_DEPARTMENT_OTHER): Payer: Medicare Other | Attending: General Surgery

## 2014-01-09 DIAGNOSIS — L8992 Pressure ulcer of unspecified site, stage 2: Secondary | ICD-10-CM | POA: Insufficient documentation

## 2014-01-09 DIAGNOSIS — L89109 Pressure ulcer of unspecified part of back, unspecified stage: Secondary | ICD-10-CM | POA: Diagnosis present

## 2014-01-11 ENCOUNTER — Other Ambulatory Visit: Payer: Self-pay | Admitting: Interventional Cardiology

## 2014-01-16 DIAGNOSIS — L89109 Pressure ulcer of unspecified part of back, unspecified stage: Secondary | ICD-10-CM | POA: Diagnosis not present

## 2014-01-16 DIAGNOSIS — L8992 Pressure ulcer of unspecified site, stage 2: Secondary | ICD-10-CM | POA: Diagnosis not present

## 2014-01-19 ENCOUNTER — Ambulatory Visit (INDEPENDENT_AMBULATORY_CARE_PROVIDER_SITE_OTHER): Payer: Medicare Other | Admitting: *Deleted

## 2014-01-19 ENCOUNTER — Encounter: Payer: Self-pay | Admitting: *Deleted

## 2014-01-19 DIAGNOSIS — Z950 Presence of cardiac pacemaker: Secondary | ICD-10-CM

## 2014-01-19 DIAGNOSIS — I5022 Chronic systolic (congestive) heart failure: Secondary | ICD-10-CM

## 2014-01-19 NOTE — Progress Notes (Signed)
EPIC Encounter for ICM Monitoring  Patient Name: [name] is a 78 y.o. male Date: 01/19/2014 Primary Care Physican: Horton Finer, MD Primary Cardiologist: Tamala Julian Electrophysiologist: Lovena Le Dry Weight: 160 lbs       In the past month, have you:  1. Gained more than 2 pounds in a day or more than 5 pounds in a week? No. The patient has actively been trying to lose weight. He has dropped from 175 lbs when I started following him to 160 lbs currently.   2. Had changes in your medications (with verification of current medications)? no  3. Had more shortness of breath than is usual for you? no  4. Limited your activity because of shortness of breath? no  5. Not been able to sleep because of shortness of breath? no  6. Had increased swelling in your feet or ankles? no  7. Had symptoms of dehydration (dizziness, dry mouth, increased thirst, decreased urine output) no  8. Had changes in sodium restriction? no  9. Been compliant with medication? Yes   ICM trend:   Follow-up plan: ICM clinic phone appointment: 03/12/14. The patient has follow up scheduled on 02/05/14 with Dr. Lovena Le. His baseline impedence has been trending up since about mid May. The patient does report that his SBP's have been running from the 90's-107 over the last week. He reports his typical SBP is around 140. I have advised with the heat and with him progressively losing weight, that he is may be losing volume as well. He has been on lasix 40 mg two tablets in the am and one tablet in the pm. I have suggested that he decrease his lasix to 40 mg one tablet twice daily to see if his pressure comes up slightly and to ensure he does not become dehydrated. He will try this until he sees Dr. Lovena Le back, unless he notices that he develops SOB, swelling, or weights start to increase, then he will resume his current dosing on his lasix.  Copy of note sent to patient's primary care physician, primary cardiologist,  and device following physician.  Alvis Lemmings, RN, BSN 01/19/2014 10:13 AM

## 2014-01-23 DIAGNOSIS — L89109 Pressure ulcer of unspecified part of back, unspecified stage: Secondary | ICD-10-CM | POA: Diagnosis not present

## 2014-01-23 DIAGNOSIS — L8992 Pressure ulcer of unspecified site, stage 2: Secondary | ICD-10-CM | POA: Diagnosis not present

## 2014-01-30 DIAGNOSIS — L8992 Pressure ulcer of unspecified site, stage 2: Secondary | ICD-10-CM | POA: Diagnosis not present

## 2014-01-30 DIAGNOSIS — L89109 Pressure ulcer of unspecified part of back, unspecified stage: Secondary | ICD-10-CM | POA: Diagnosis not present

## 2014-02-04 ENCOUNTER — Other Ambulatory Visit: Payer: Self-pay | Admitting: Surgery

## 2014-02-04 DIAGNOSIS — Z48812 Encounter for surgical aftercare following surgery on the circulatory system: Secondary | ICD-10-CM

## 2014-02-04 DIAGNOSIS — I739 Peripheral vascular disease, unspecified: Secondary | ICD-10-CM

## 2014-02-05 ENCOUNTER — Encounter: Payer: Self-pay | Admitting: Internal Medicine

## 2014-02-05 ENCOUNTER — Ambulatory Visit (INDEPENDENT_AMBULATORY_CARE_PROVIDER_SITE_OTHER): Payer: Medicare Other | Admitting: Internal Medicine

## 2014-02-05 VITALS — BP 145/58 | HR 79 | Ht 67.0 in | Wt 164.2 lb

## 2014-02-05 DIAGNOSIS — I472 Ventricular tachycardia: Secondary | ICD-10-CM

## 2014-02-05 DIAGNOSIS — I4819 Other persistent atrial fibrillation: Secondary | ICD-10-CM

## 2014-02-05 DIAGNOSIS — I2510 Atherosclerotic heart disease of native coronary artery without angina pectoris: Secondary | ICD-10-CM

## 2014-02-05 DIAGNOSIS — I255 Ischemic cardiomyopathy: Secondary | ICD-10-CM

## 2014-02-05 DIAGNOSIS — Z950 Presence of cardiac pacemaker: Secondary | ICD-10-CM

## 2014-02-05 DIAGNOSIS — I5022 Chronic systolic (congestive) heart failure: Secondary | ICD-10-CM

## 2014-02-05 DIAGNOSIS — I4891 Unspecified atrial fibrillation: Secondary | ICD-10-CM

## 2014-02-05 DIAGNOSIS — I4729 Other ventricular tachycardia: Secondary | ICD-10-CM

## 2014-02-05 DIAGNOSIS — I2589 Other forms of chronic ischemic heart disease: Secondary | ICD-10-CM

## 2014-02-05 LAB — MDC_IDC_ENUM_SESS_TYPE_INCLINIC
Battery Remaining Longevity: 88 mo
Brady Statistic AP VP Percent: 0.25 %
Brady Statistic AP VS Percent: 0.03 %
Brady Statistic AS VP Percent: 69.68 %
Brady Statistic AS VS Percent: 30.05 %
Brady Statistic RV Percent Paced: 71.21 %
Date Time Interrogation Session: 20150806202101
HighPow Impedance: 228 Ohm
HighPow Impedance: 47 Ohm
HighPow Impedance: 54 Ohm
Lead Channel Impedance Value: 4047 Ohm
Lead Channel Impedance Value: 4047 Ohm
Lead Channel Impedance Value: 475 Ohm
Lead Channel Impedance Value: 532 Ohm
Lead Channel Pacing Threshold Amplitude: 0.5 V
Lead Channel Pacing Threshold Amplitude: 0.625 V
Lead Channel Pacing Threshold Pulse Width: 0.4 ms
Lead Channel Sensing Intrinsic Amplitude: 0.625 mV
Lead Channel Sensing Intrinsic Amplitude: 1 mV
Lead Channel Setting Pacing Amplitude: 2.25 V
Lead Channel Setting Pacing Pulse Width: 0.4 ms
Lead Channel Setting Pacing Pulse Width: 0.4 ms
Lead Channel Setting Sensing Sensitivity: 0.3 mV
MDC IDC MSMT BATTERY VOLTAGE: 2.97 V
MDC IDC MSMT LEADCHNL LV PACING THRESHOLD AMPLITUDE: 1.125 V
MDC IDC MSMT LEADCHNL LV PACING THRESHOLD PULSEWIDTH: 0.4 ms
MDC IDC MSMT LEADCHNL RV IMPEDANCE VALUE: 418 Ohm
MDC IDC MSMT LEADCHNL RV PACING THRESHOLD PULSEWIDTH: 0.4 ms
MDC IDC MSMT LEADCHNL RV SENSING INTR AMPL: 10.625 mV
MDC IDC MSMT LEADCHNL RV SENSING INTR AMPL: 12.625 mV
MDC IDC SET LEADCHNL RV PACING AMPLITUDE: 2.5 V
MDC IDC SET ZONE DETECTION INTERVAL: 350 ms
MDC IDC SET ZONE DETECTION INTERVAL: 460 ms
MDC IDC STAT BRADY RA PERCENT PACED: 0.27 %
Zone Setting Detection Interval: 300 ms
Zone Setting Detection Interval: 340 ms
Zone Setting Detection Interval: 430 ms

## 2014-02-05 NOTE — Assessment & Plan Note (Signed)
His BiV ICD is working normally. He was reprogrammed from DDD to VVI.

## 2014-02-05 NOTE — Assessment & Plan Note (Signed)
His symptoms are multifactorial and class 2. I have encouraged the patient to reduce his sodium intake. He will continue his current meds.

## 2014-02-05 NOTE — Patient Instructions (Signed)
Your physician wants you to follow-up in: 12 months with Dr Knox Saliva will receive a reminder letter in the mail two months in advance. If you don't receive a letter, please call our office to schedule the follow-up appointment.  Remote monitoring is used to monitor your Pacemaker of ICD from home. This monitoring reduces the number of office visits required to check your device to one time per year. It allows [place] to keep an eye on the functioning of your device to ensure it is working properly. You are scheduled for a device check from home on 05/11/14. You may send your transmission at any time that day. If you have a wireless device, the transmission will be sent automatically. After your physician reviews your transmission, you will receive a postcard with your next transmission date.

## 2014-02-05 NOTE — Progress Notes (Signed)
HPI [name] returns today for followup. He is a pleasant 78 yo man with an ICM, chronic atrial fib, chronic systolic heart failure, s/p BiV ICD implant. He had been bothered by some diaphragmatic stimulation but this has resolved. He denies chest pain. He has class 2 CHF. He denies chest pain. He c/o right lower extremity swelling since undergoing vascular surgery. No ICD shock. No Known Allergies   Current Outpatient Prescriptions  Medication Sig Dispense Refill  . acetaminophen (TYLENOL) 325 MG tablet Take 2 tablets (650 mg total) by mouth every 4 (four) hours as needed for headache or mild pain.      Marland Kitchen albuterol (PROVENTIL HFA;VENTOLIN HFA) 108 (90 BASE) MCG/ACT inhaler Inhale 2 puffs into the lungs every 6 (six) hours as needed for wheezing or shortness of breath.       Marland Kitchen albuterol (PROVENTIL) (2.5 MG/3ML) 0.083% nebulizer solution Take 2.5 mg by nebulization 4 (four) times daily.       Marland Kitchen amLODipine (NORVASC) 5 MG tablet TAKE 1 TABLET DAILY  90 tablet  1  . aspirin 325 MG tablet Take 325 mg by mouth daily.      . budesonide (PULMICORT) 0.5 MG/2ML nebulizer solution Take 0.5 mg by nebulization 2 (two) times daily.       . candesartan (ATACAND) 32 MG tablet Take 32 mg by mouth daily.       . carvedilol (COREG) 25 MG tablet Take 25 mg by mouth 2 (two) times daily with a meal.      . collagenase (SANTYL) ointment Apply 1 application topically daily.      . ferrous sulfate 325 (65 FE) MG tablet Take 325 mg by mouth at bedtime.       . furosemide (LASIX) 40 MG tablet TAKE 2 TABLETS IN THE MORNING AND 1 TABLET IN THE EVENING  270 tablet  2  . guaiFENesin (MUCINEX) 600 MG 12 hr tablet Take 600 mg by mouth 2 (two) times daily as needed. congestion      . HYDROcodone-acetaminophen (VICODIN) 2.5-500 MG per tablet Take 1 tablet by mouth every 6 (six) hours as needed for pain.      Marland Kitchen KLOR-CON M20 20 MEQ tablet TAKE 1 TABLET DAILY  90 tablet  0  . LANOXIN 125 MCG tablet TAKE 1 TABLET DAILY   90 tablet  1  . nitroGLYCERIN (NITRODUR - DOSED IN MG/24 HR) 0.2 mg/hr patch Place 1 patch onto the skin daily.      . nitroGLYCERIN (NITROSTAT) 0.4 MG SL tablet Place 0.4 mg under the tongue every 5 (five) minutes as needed for chest pain.      . predniSONE (DELTASONE) 10 MG tablet Take 10 mg by mouth daily.      Marland Kitchen PRESCRIPTION MEDICATION Uses 1L-2L of home Oxygen      . simvastatin (ZOCOR) 20 MG tablet Take 20 mg by mouth at bedtime.       Marland Kitchen tiotropium (SPIRIVA) 18 MCG inhalation capsule Place 18 mcg into inhaler and inhale daily.        No current facility-administered medications for this visit.     Past Medical History  Diagnosis Date  . Hypokalemia   . Colon cancer   . PVD (peripheral vascular disease)   . HTN (hypertension)   . Automatic implantable cardiac defibrillator in situ   . CHF (congestive heart failure)   . Ventricular tachycardia   . Ischemic heart disease, chronic   . Dyslipidemia   .  CAD (coronary artery disease)   . COPD (chronic obstructive pulmonary disease)   . Emphysema   . On home oxygen therapy   . Pacemaker   . Self-catheterizes urinary bladder     since back surgery.  6- 7 times a day.  . Difficult intubation     pt states "not aware of difficult intubation, never told"  . Heart murmur     dx 1974  . Stroke     TIA- 1980's  . Shortness of breath   . Diabetes mellitus without complication     boderline  . Head injury, closed, with brief LOC 2013  . Anemia   . Neurogenic bladder     since back surgery - self caths  . Neurogenic bowel     digitally stimulates daily.    ROS:   All systems reviewed and negative except as noted in the HPI.   Past Surgical History  Procedure Laterality Date  . Lumbar back surgery      x 3  fusion- lumbar  . Iliac artery stent      left  . Implantation of medtronic concerto bi-v icd  12-24-74    Robert Peru MD  . Colonoscopy    . Polypectomy    . Insertion of iliac stent  06/04/2012    right  external   . Cardiac catheterization    . Insert / replace / remove pacemaker    . Endarterectomy femoral  06/28/2012    Procedure: ENDARTERECTOMY FEMORAL;  Surgeon: Serafina Mitchell, MD;  Location: [place];  Service: Vascular;  Laterality: Left;  . Patch angioplasty  06/28/2012    Procedure: PATCH ANGIOPLASTY;  Surgeon: Serafina Mitchell, MD;  Location: [facility] OR;  Service: Vascular;  Laterality: Left;  Using 0.8 cm x 15.2 cm Hemashield Patch.  . Colon surgery  03/2004  . Eye surgery Bilateral     cataracts  . Cholecystectomy    . Coronary artery bypass graft  1998    x3 vessels  . Femoral-popliteal bypass graft Right 03/28/2013    Procedure: BYPASS GRAFT FEMORAL-BELOW KNEE POPLITEAL ARTERY Using 43mm x 80 cm Propaten Graft ,With Right Common Femoral Artery Endarterectomy.;  Surgeon: Serafina Mitchell, MD;  Location: [facility] [facility] OR;  Service: Vascular;  Laterality: Right;     Family History  Problem Relation Age of Onset  . Heart failure Father   . Heart disease Mother     before age 53  . Hyperlipidemia Mother   . Hypertension Mother      History   Social History  . Marital Status: Married    Spouse Name: N/A    Number of Children: N/A  . Years of Education: N/A   Occupational History  .      Retired Social research officer, government   Social History Main Topics  . Smoking status: Former Smoker -- 3.00 packs/day for 48 years    Types: Cigarettes    Quit date: 07/03/2002  . Smokeless tobacco: Former Systems developer    Quit date: 07/11/2002  . Alcohol Use: Yes     Comment: rarely  . Drug Use: No  . Sexual Activity: Not on file   Other Topics Concern  . Not on file   Social History Narrative   Married, wife [name]   Oxygen-continuous   I/O self cath since back surgery     BP 145/58  Pulse 79  Ht 5\' 7"  (1.702 m)  Wt 164 lb 3.2 oz (74.481 kg)  BMI 25.71 kg/m2  Physical Exam:  Chronically ill but stable appearing 78 yo man, NAD HEENT: Unremarkable Neck:  7 cm JVD, no thyromegally Back:   No CVA tenderness Lungs:  Clear with minimal basilar rales and reduced breath sounds. HEART:  IRegular rate rhythm, no murmurs, no rubs, no clicks Abd:  soft, positive bowel sounds, no organomegally, no rebound, no guarding Ext:  2 plus pulses, no edema, no cyanosis, no clubbing Skin:  No rashes no nodules Neuro:  CN II through XII intact, motor grossly intact  EKG -  Atrial fib with a controlled VR and BiV pacing  DEVICE  Normal device function.  See PaceArt for details.   Assess/Plan:

## 2014-02-05 NOTE — Assessment & Plan Note (Signed)
He denies anginal symptoms. No change in meds.  

## 2014-02-06 ENCOUNTER — Encounter (HOSPITAL_BASED_OUTPATIENT_CLINIC_OR_DEPARTMENT_OTHER): Payer: Medicare Other | Attending: General Surgery

## 2014-02-06 DIAGNOSIS — L8992 Pressure ulcer of unspecified site, stage 2: Secondary | ICD-10-CM | POA: Insufficient documentation

## 2014-02-06 DIAGNOSIS — L89109 Pressure ulcer of unspecified part of back, unspecified stage: Secondary | ICD-10-CM | POA: Insufficient documentation

## 2014-02-06 LAB — MDC_IDC_ENUM_SESS_TYPE_REMOTE
Battery Voltage: 2.97 V
Brady Statistic AP VP Percent: 0.25 %
Brady Statistic AP VS Percent: 0.03 %
Brady Statistic AS VP Percent: 68.68 %
Brady Statistic RA Percent Paced: 0.28 %
Brady Statistic RV Percent Paced: 70.26 %
HIGH POWER IMPEDANCE MEASURED VALUE: 53 Ohm
HighPow Impedance: 228 Ohm
HighPow Impedance: 48 Ohm
Lead Channel Impedance Value: 399 Ohm
Lead Channel Impedance Value: 4047 Ohm
Lead Channel Pacing Threshold Amplitude: 0.625 V
Lead Channel Pacing Threshold Pulse Width: 0.4 ms
Lead Channel Pacing Threshold Pulse Width: 0.4 ms
Lead Channel Pacing Threshold Pulse Width: 0.4 ms
Lead Channel Sensing Intrinsic Amplitude: 0.625 mV
Lead Channel Sensing Intrinsic Amplitude: 11.375 mV
Lead Channel Setting Pacing Amplitude: 2 V
Lead Channel Setting Pacing Amplitude: 2.5 V
Lead Channel Setting Pacing Pulse Width: 0.4 ms
Lead Channel Setting Pacing Pulse Width: 0.4 ms
Lead Channel Setting Sensing Sensitivity: 0.3 mV
MDC IDC MSMT BATTERY REMAINING LONGEVITY: 90 mo
MDC IDC MSMT LEADCHNL LV IMPEDANCE VALUE: 4047 Ohm
MDC IDC MSMT LEADCHNL LV IMPEDANCE VALUE: 513 Ohm
MDC IDC MSMT LEADCHNL LV PACING THRESHOLD AMPLITUDE: 1 V
MDC IDC MSMT LEADCHNL RA IMPEDANCE VALUE: 532 Ohm
MDC IDC MSMT LEADCHNL RA PACING THRESHOLD AMPLITUDE: 0.5 V
MDC IDC MSMT LEADCHNL RA SENSING INTR AMPL: 0.625 mV
MDC IDC MSMT LEADCHNL RV SENSING INTR AMPL: 11.375 mV
MDC IDC SESS DTM: 20150720113026
MDC IDC SET LEADCHNL LV PACING AMPLITUDE: 2 V
MDC IDC SET ZONE DETECTION INTERVAL: 300 ms
MDC IDC SET ZONE DETECTION INTERVAL: 340 ms
MDC IDC SET ZONE DETECTION INTERVAL: 350 ms
MDC IDC STAT BRADY AS VS PERCENT: 31.04 %
Zone Setting Detection Interval: 430 ms
Zone Setting Detection Interval: 460 ms

## 2014-02-10 ENCOUNTER — Other Ambulatory Visit: Payer: Self-pay | Admitting: Pulmonary Disease

## 2014-02-13 DIAGNOSIS — L8992 Pressure ulcer of unspecified site, stage 2: Secondary | ICD-10-CM | POA: Diagnosis not present

## 2014-02-13 DIAGNOSIS — L89109 Pressure ulcer of unspecified part of back, unspecified stage: Secondary | ICD-10-CM | POA: Diagnosis not present

## 2014-02-16 ENCOUNTER — Encounter (HOSPITAL_COMMUNITY): Payer: Medicare Other

## 2014-02-16 ENCOUNTER — Other Ambulatory Visit (HOSPITAL_COMMUNITY): Payer: Medicare Other

## 2014-02-16 ENCOUNTER — Ambulatory Visit: Payer: Medicare Other | Admitting: Surgery

## 2014-02-17 ENCOUNTER — Other Ambulatory Visit: Payer: Self-pay | Admitting: Pulmonary Disease

## 2014-02-20 ENCOUNTER — Encounter: Payer: Self-pay | Admitting: Surgery

## 2014-02-20 DIAGNOSIS — L8992 Pressure ulcer of unspecified site, stage 2: Secondary | ICD-10-CM | POA: Diagnosis not present

## 2014-02-20 DIAGNOSIS — L89109 Pressure ulcer of unspecified part of back, unspecified stage: Secondary | ICD-10-CM | POA: Diagnosis not present

## 2014-02-23 ENCOUNTER — Ambulatory Visit (HOSPITAL_COMMUNITY)
Admission: RE | Admit: 2014-02-23 | Discharge: 2014-02-23 | Disposition: A | Discharge status: Home / Self Care | Payer: Medicare Other | Source: Ambulatory Visit | Attending: Surgery | Admitting: Surgery

## 2014-02-23 ENCOUNTER — Ambulatory Visit (INDEPENDENT_AMBULATORY_CARE_PROVIDER_SITE_OTHER): Payer: Medicare Other | Admitting: Surgery

## 2014-02-23 ENCOUNTER — Ambulatory Visit (INDEPENDENT_AMBULATORY_CARE_PROVIDER_SITE_OTHER)
Admission: RE | Admit: 2014-02-23 | Discharge: 2014-02-23 | Disposition: A | Discharge status: Home / Self Care | Payer: Medicare Other | Source: Ambulatory Visit | Attending: Surgery | Admitting: Surgery

## 2014-02-23 ENCOUNTER — Encounter: Payer: Self-pay | Admitting: Surgery

## 2014-02-23 VITALS — BP 151/57 | HR 70 | Ht 67.0 in | Wt 160.0 lb

## 2014-02-23 DIAGNOSIS — I739 Peripheral vascular disease, unspecified: Secondary | ICD-10-CM

## 2014-02-23 DIAGNOSIS — Z48812 Encounter for surgical aftercare following surgery on the circulatory system: Secondary | ICD-10-CM

## 2014-02-23 DIAGNOSIS — I2510 Atherosclerotic heart disease of native coronary artery without angina pectoris: Secondary | ICD-10-CM

## 2014-02-23 NOTE — Progress Notes (Signed)
Patient name: [name] MRN: [id number] DOB: 1936/04/06 Sex: male     Chief Complaint  Patient presents with  . Re-evaluation    6 month f/u     HISTORY OF PRESENT ILLNESS: The patient is back today for followup.  In December 2013, he underwent stenting of the right external iliac artery.  Also in December 2013 he underwent left femoral endarterectomy with patch angioplasty.  In September 2014 he underwent right femoral to below knee popliteal artery bypass graft with PTFE.  He has developed a sacral decubitus which he has been treating at the wound center.  He also states that for 2 weeks he has had a wound on his right second toe.  Past Medical History  Diagnosis Date  . Hypokalemia   . Colon cancer   . PVD (peripheral vascular disease)   . HTN (hypertension)   . Automatic implantable cardiac defibrillator in situ   . CHF (congestive heart failure)   . Ventricular tachycardia   . Ischemic heart disease, chronic   . Dyslipidemia   . CAD (coronary artery disease)   . COPD (chronic obstructive pulmonary disease)   . Emphysema   . On home oxygen therapy   . Pacemaker   . Self-catheterizes urinary bladder     since back surgery.  6- 7 times a day.  . Difficult intubation     pt states "not aware of difficult intubation, never told"  . Heart murmur     dx 1974  . Stroke     TIA- 1980's  . Shortness of breath   . Diabetes mellitus without complication     boderline  . Head injury, closed, with brief LOC 2013  . Anemia   . Neurogenic bladder     since back surgery - self caths  . Neurogenic bowel     digitally stimulates daily.    Past Surgical History  Procedure Laterality Date  . Lumbar back surgery      x 3  fusion- lumbar  . Iliac artery stent      left  . Implantation of medtronic concerto bi-v icd  [id number]    Cristopher Peru MD  . Colonoscopy    . Polypectomy    . Insertion of iliac stent  06/04/2012    right external   . Cardiac catheterization      . Insert / replace / remove pacemaker    . Endarterectomy femoral  06/28/2012    Procedure: ENDARTERECTOMY FEMORAL;  Surgeon: Serafina Mitchell, MD;  Location: [facility];  Service: Vascular;  Laterality: Left;  . Patch angioplasty  06/28/2012    Procedure: PATCH ANGIOPLASTY;  Surgeon: Serafina Mitchell, MD;  Location: [facility] OR;  Service: Vascular;  Laterality: Left;  Using 0.8 cm x 15.2 cm Hemashield Patch.  . Colon surgery  03/2004  . Eye surgery Bilateral     cataracts  . Cholecystectomy    . Coronary artery bypass graft  1998    x3 vessels  . Femoral-popliteal bypass graft Right 03/28/2013    Procedure: BYPASS GRAFT FEMORAL-BELOW KNEE POPLITEAL ARTERY Using 34mm x 80 cm Propaten Graft ,With Right Common Femoral Artery Endarterectomy.;  Surgeon: Serafina Mitchell, MD;  Location: [place];  Service: Vascular;  Laterality: Right;    History   Social History  . Marital Status: Married    Spouse Name: N/A    Number of Children: N/A  . Years of Education: N/A   Occupational History  .  Retired Social research officer, government   Social History Main Topics  . Smoking status: Former Smoker -- 3.00 packs/day for 48 years    Types: Cigarettes    Quit date: 07/03/2002  . Smokeless tobacco: Former Systems developer    Quit date: 07/11/2002  . Alcohol Use: Yes     Comment: rarely  . Drug Use: No  . Sexual Activity: Not on file   Other Topics Concern  . Not on file   Social History Narrative   Married, wife [name]   Oxygen-continuous   I/O self cath since back surgery    Family History  Problem Relation Age of Onset  . Heart failure Father   . Diabetes Father   . Heart disease Father   . Hyperlipidemia Father   . Hypertension Father   . Heart disease Mother     before age 83  . Hyperlipidemia Mother   . Hypertension Mother     Allergies as of 02/23/2014  . (No Known Allergies)    Current Outpatient Prescriptions on File Prior to Visit  Medication Sig Dispense Refill  . acetaminophen  (TYLENOL) 325 MG tablet Take 2 tablets (650 mg total) by mouth every 4 (four) hours as needed for headache or mild pain.      Marland Kitchen albuterol (PROVENTIL HFA;VENTOLIN HFA) 108 (90 BASE) MCG/ACT inhaler Inhale 2 puffs into the lungs every 6 (six) hours as needed for wheezing or shortness of breath.       Marland Kitchen albuterol (PROVENTIL) (2.5 MG/3ML) 0.083% nebulizer solution Take 2.5 mg by nebulization 4 (four) times daily.       Marland Kitchen amLODipine (NORVASC) 5 MG tablet TAKE 1 TABLET DAILY  90 tablet  1  . aspirin 325 MG tablet Take 325 mg by mouth daily.      . budesonide (PULMICORT) 0.5 MG/2ML nebulizer solution Take 0.5 mg by nebulization 2 (two) times daily.       . candesartan (ATACAND) 32 MG tablet Take 32 mg by mouth daily.       . carvedilol (COREG) 25 MG tablet Take 25 mg by mouth 2 (two) times daily with a meal.      . collagenase (SANTYL) ointment Apply 1 application topically daily.      . ferrous sulfate 325 (65 FE) MG tablet Take 325 mg by mouth at bedtime.       . furosemide (LASIX) 40 MG tablet TAKE 2 TABLETS IN THE MORNING AND 1 TABLET IN THE EVENING  270 tablet  2  . guaiFENesin (MUCINEX) 600 MG 12 hr tablet Take 600 mg by mouth 2 (two) times daily as needed. congestion      . HYDROcodone-acetaminophen (VICODIN) 2.5-500 MG per tablet Take 1 tablet by mouth every 6 (six) hours as needed for pain.      Marland Kitchen KLOR-CON M20 20 MEQ tablet TAKE 1 TABLET DAILY  90 tablet  1  . LANOXIN 125 MCG tablet TAKE 1 TABLET DAILY  90 tablet  1  . nitroGLYCERIN (NITRODUR - DOSED IN MG/24 HR) 0.2 mg/hr patch Place 1 patch onto the skin daily.      . nitroGLYCERIN (NITROSTAT) 0.4 MG SL tablet Place 0.4 mg under the tongue every 5 (five) minutes as needed for chest pain.      . predniSONE (DELTASONE) 10 MG tablet Take 10 mg by mouth daily.      Marland Kitchen PRESCRIPTION MEDICATION Uses 1L-2L of home Oxygen      . PROVENTIL HFA 108 (90 BASE)  MCG/ACT inhaler INHALE 1 TO 2 PUFFS INTO THE LUNGS EVERY 6 HOURS AS NEEDED FOR SHORTNESS OF BREATH   3 each  1  . simvastatin (ZOCOR) 20 MG tablet Take 20 mg by mouth at bedtime.       Marland Kitchen tiotropium (SPIRIVA) 18 MCG inhalation capsule Place 18 mcg into inhaler and inhale daily.        No current facility-administered medications on file prior to visit.     REVIEW OF SYSTEMS: Please see history of present illness, otherwise no changes from prior visit  PHYSICAL EXAMINATION:   Vital signs are BP 151/57  Pulse 70  Ht 5\' 7"  (1.702 m)  Wt 160 lb (72.576 kg)  BMI 25.05 kg/m2  SpO2 100% General: The patient appears their stated age. HEENT:  No gross abnormalities Pulmonary:  Non labored breathing Musculoskeletal: There are no major deformities. Neurologic: No focal weakness or paresthesias are detected, Skin: Erythema and a 1 mm open wound to the right second toe Psychiatric: The patient has normal affect. Cardiovascular: There is a regular rate and rhythm without significant murmur appreciated.  Bilateral lower extremity edema   Diagnostic Studies I have reviewed his ultrasound from today.  This shows triphasic posterior tibial waveforms on the right.  The left is monophasic.  Duplex shows a widely patent right leg bypass graft with no stenosis.  Assessment: Peripheral vascular disease, status post multiple revascularization procedures Plan: The patient's ultrasound today shows a widely patent bypass graft.  I did not like the appearance of his right second toe as it appeared to be erythematous.  I gave him a prescription of Levaquin for 2 weeks.  He is scheduled to see the wound center this Friday.  He will followup in 6 months with a repeat ultrasound for graft surveillance  V. Leia Alf, M.D. [facility] Office: [phone] Pager:  [phone]

## 2014-02-23 NOTE — Addendum Note (Signed)
Addended by: Mena Goes on: 02/23/2014 11:17 AM   Modules accepted: Orders

## 2014-02-27 DIAGNOSIS — L8992 Pressure ulcer of unspecified site, stage 2: Secondary | ICD-10-CM | POA: Diagnosis not present

## 2014-02-27 DIAGNOSIS — L89109 Pressure ulcer of unspecified part of back, unspecified stage: Secondary | ICD-10-CM | POA: Diagnosis not present

## 2014-03-06 ENCOUNTER — Encounter (HOSPITAL_BASED_OUTPATIENT_CLINIC_OR_DEPARTMENT_OTHER): Payer: Medicare Other | Attending: General Surgery

## 2014-03-06 DIAGNOSIS — L8992 Pressure ulcer of unspecified site, stage 2: Secondary | ICD-10-CM | POA: Insufficient documentation

## 2014-03-06 DIAGNOSIS — L89309 Pressure ulcer of unspecified buttock, unspecified stage: Secondary | ICD-10-CM | POA: Insufficient documentation

## 2014-03-12 ENCOUNTER — Encounter: Payer: Self-pay | Admitting: *Deleted

## 2014-03-12 ENCOUNTER — Ambulatory Visit (INDEPENDENT_AMBULATORY_CARE_PROVIDER_SITE_OTHER): Payer: Medicare Other | Admitting: *Deleted

## 2014-03-12 DIAGNOSIS — Z95810 Presence of automatic (implantable) cardiac defibrillator: Secondary | ICD-10-CM

## 2014-03-12 DIAGNOSIS — I5022 Chronic systolic (congestive) heart failure: Secondary | ICD-10-CM

## 2014-03-12 NOTE — Progress Notes (Signed)
EPIC Encounter for ICM Monitoring  Patient Name: [name] is a 78 y.o. male Date: 03/12/2014 Primary Care Physican: Valaria Good, MD Primary Cardiologist: Tamala Julian Electrophysiologist: Lovena Le Dry Weight: 158 lbs       In the past month, have you:  1. Gained more than 2 pounds in a day or more than 5 pounds in a week? no  2. Had changes in your medications (with verification of current medications)? Yes. The patient has a wound on his sacral area that he has been going to the wound center for. This area just tested positive for MRSA. He is being treated with a course of antibiotics.  3. Had more shortness of breath than is usual for you? no  4. Limited your activity because of shortness of breath? no  5. Not been able to sleep because of shortness of breath? no  6. Had increased swelling in your feet or ankles? no  7. Had symptoms of dehydration (dizziness, dry mouth, increased thirst, decreased urine output) no  8. Had changes in sodium restriction? no  9. Been compliant with medication? Yes   ICM trend:   Follow-up plan: ICM clinic phone appointment: 04/13/14  Copy of note sent to patient's primary care physician, primary cardiologist, and device following physician.  Alvis Lemmings, RN, BSN 03/12/2014 3:29 PM

## 2014-03-13 DIAGNOSIS — L89309 Pressure ulcer of unspecified buttock, unspecified stage: Secondary | ICD-10-CM | POA: Diagnosis not present

## 2014-03-13 DIAGNOSIS — L8992 Pressure ulcer of unspecified site, stage 2: Secondary | ICD-10-CM | POA: Diagnosis not present

## 2014-03-17 ENCOUNTER — Telehealth: Payer: Self-pay

## 2014-03-17 DIAGNOSIS — I739 Peripheral vascular disease, unspecified: Secondary | ICD-10-CM

## 2014-03-17 DIAGNOSIS — Z959 Presence of cardiac and vascular implant and graft, unspecified: Secondary | ICD-10-CM

## 2014-03-17 NOTE — Telephone Encounter (Signed)
Phone call from pt.  Reported pain in left anterior thigh x 1 week.  Stated the pain comes and goes.  Reported there is always swelling in the lower legs, from foot to calf, with right > left.  Denies any change in sensation, temperature, or color of left leg. Denies any heaviness.  Stated the pain in the thigh is worse with walking, initially, and starts to ease up, but doesn't go away, completely.  Also, reports pain in left thigh at times during night.  Will discuss with Dr. Trula Slade.

## 2014-03-18 ENCOUNTER — Encounter: Payer: Self-pay | Admitting: Family

## 2014-03-18 NOTE — Telephone Encounter (Signed)
Received recommendation from Dr. Trula Slade to schedule pt. for left leg Art. Duplex and ABI's.  Will notify pt. Of appts.

## 2014-03-19 ENCOUNTER — Ambulatory Visit (HOSPITAL_COMMUNITY)
Admission: RE | Admit: 2014-03-19 | Discharge: 2014-03-19 | Disposition: A | Discharge status: Home / Self Care | Payer: Medicare Other | Source: Ambulatory Visit | Attending: Family | Admitting: Family

## 2014-03-19 ENCOUNTER — Encounter: Payer: Self-pay | Admitting: Family

## 2014-03-19 ENCOUNTER — Ambulatory Visit (INDEPENDENT_AMBULATORY_CARE_PROVIDER_SITE_OTHER): Payer: Medicare Other | Admitting: Family

## 2014-03-19 VITALS — BP 121/64 | HR 78 | Resp 16 | Ht 67.0 in | Wt 162.0 lb

## 2014-03-19 DIAGNOSIS — I739 Peripheral vascular disease, unspecified: Secondary | ICD-10-CM | POA: Insufficient documentation

## 2014-03-19 DIAGNOSIS — Z48812 Encounter for surgical aftercare following surgery on the circulatory system: Secondary | ICD-10-CM

## 2014-03-19 DIAGNOSIS — M7989 Other specified soft tissue disorders: Secondary | ICD-10-CM | POA: Insufficient documentation

## 2014-03-19 DIAGNOSIS — Z959 Presence of cardiac and vascular implant and graft, unspecified: Secondary | ICD-10-CM

## 2014-03-19 DIAGNOSIS — Z9889 Other specified postprocedural states: Secondary | ICD-10-CM | POA: Insufficient documentation

## 2014-03-19 DIAGNOSIS — M79609 Pain in unspecified limb: Secondary | ICD-10-CM

## 2014-03-19 DIAGNOSIS — I2510 Atherosclerotic heart disease of native coronary artery without angina pectoris: Secondary | ICD-10-CM

## 2014-03-19 DIAGNOSIS — M79605 Pain in left leg: Secondary | ICD-10-CM

## 2014-03-19 NOTE — Progress Notes (Signed)
[facility] [place] HISTORY AND PHYSICAL -PAD  History of Present Illness [name] is a 78 y.o. male patient of Dr. Trula Slade. In December 2013,he underwent stenting of the right external iliac artery. Also in December 2013 he underwent left femoral endarterectomy with patch angioplasty. In September 2014 he underwent right femoral to below knee popliteal artery bypass graft with PTFE. He has developed a sacral decubitus which he has been treating at the wound center. He also states that for 2 weeks he has had a wound on his right second toe. The wound on his right second toe healed. He states 2 weeks ago MRSA was discovered in his sacral ulcer, is being treated at the wound center. He wears knee high compression hose for bilateral lower legs edema. He returns today with C/O Left anterior thigh pain with standing, not at rest, duration 2 wks. Swelling lower legs from calf to foot, Right > Left.Pain at night in left Thigh at times. 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 He reports a TIA in the 1980's as manifested by right leg weakness and light headedness, no stroke or TIA symptoms since then.  The patient denies New Medical or Surgical History.  Pt Diabetic: "borderline", not taking DM medications, does check his blood sugar bid Pt smoker: former smoker, quit in 2004  Pt meds include: Statin :Yes Betablocker: Yes ASA: Yes Other anticoagulants/antiplatelets: was taken off coumadin for atrial fib due to falling twice and having an intracranial bleed.  Past Medical History  Diagnosis Date  . Hypokalemia   . Colon cancer   . PVD (peripheral vascular disease)   . HTN (hypertension)   . Automatic implantable cardiac defibrillator in situ   . CHF (congestive heart failure)   . Ventricular tachycardia   . Ischemic heart disease, chronic   . Dyslipidemia   . CAD (coronary artery disease)   . COPD (chronic obstructive  pulmonary disease)   . Emphysema   . On home oxygen therapy   . Pacemaker   . Self-catheterizes urinary bladder     since back surgery.  6- 7 times a day.  . Difficult intubation     pt states "not aware of difficult intubation, never told"  . Heart murmur     dx 1974  . Stroke     TIA- 1980's  . Shortness of breath   . Diabetes mellitus without complication     boderline  . Head injury, closed, with brief LOC 2013  . Anemia   . Neurogenic bladder     since back surgery - self caths  . Neurogenic bowel     digitally stimulates daily.    Social History History  Substance Use Topics  . Smoking status: Former Smoker -- 3.00 packs/day for 48 years    Types: Cigarettes    Quit date: 07/03/2002  . Smokeless tobacco: Former Systems developer    Quit date: 07/11/2002  . Alcohol Use: Yes     Comment: rarely    Family History Family History  Problem Relation Age of Onset  . Heart failure Father   . Diabetes Father   . Heart disease Father   . Hyperlipidemia Father   . Hypertension Father   . Heart disease Mother     before age 62  . Hyperlipidemia Mother   . Hypertension Mother     Past Surgical History  Procedure Laterality Date  . Lumbar back  surgery      x 3  fusion- lumbar  . Iliac artery stent      left  . Implantation of medtronic concerto bi-v icd  [id number]    Cristopher Peru MD  . Colonoscopy    . Polypectomy    . Insertion of iliac stent  06/04/2012    right external   . Cardiac catheterization    . Insert / replace / remove pacemaker    . Endarterectomy femoral  06/28/2012    Procedure: ENDARTERECTOMY FEMORAL;  Surgeon: Serafina Mitchell, MD;  Location: [place];  Service: Vascular;  Laterality: Left;  . Patch angioplasty  06/28/2012    Procedure: PATCH ANGIOPLASTY;  Surgeon: Serafina Mitchell, MD;  Location: [facility] OR;  Service: Vascular;  Laterality: Left;  Using 0.8 cm x 15.2 cm Hemashield Patch.  . Colon surgery  03/2004  . Eye surgery Bilateral     cataracts  .  Cholecystectomy    . Coronary artery bypass graft  1998    x3 vessels  . Femoral-popliteal bypass graft Right 03/28/2013    Procedure: BYPASS GRAFT FEMORAL-BELOW KNEE POPLITEAL ARTERY Using 42mm x 80 cm Propaten Graft ,With Right Common Femoral Artery Endarterectomy.;  Surgeon: Serafina Mitchell, MD;  Location: [facility] OR;  Service: Vascular;  Laterality: Right;    No Known Allergies  Current Outpatient Prescriptions  Medication Sig Dispense Refill  . acetaminophen (TYLENOL) 325 MG tablet Take 2 tablets (650 mg total) by mouth every 4 (four) hours as needed for headache or mild pain.      Marland Kitchen albuterol (PROVENTIL HFA;VENTOLIN HFA) 108 (90 BASE) MCG/ACT inhaler Inhale 2 puffs into the lungs every 6 (six) hours as needed for wheezing or shortness of breath.       Marland Kitchen albuterol (PROVENTIL) (2.5 MG/3ML) 0.083% nebulizer solution Take 2.5 mg by nebulization 4 (four) times daily.       Marland Kitchen amLODipine (NORVASC) 5 MG tablet TAKE 1 TABLET DAILY  90 tablet  1  . aspirin 325 MG tablet Take 325 mg by mouth daily.      . budesonide (PULMICORT) 0.5 MG/2ML nebulizer solution Take 0.5 mg by nebulization 2 (two) times daily.       . candesartan (ATACAND) 32 MG tablet Take 32 mg by mouth daily.       . carvedilol (COREG) 25 MG tablet Take 25 mg by mouth 2 (two) times daily with a meal.      . collagenase (SANTYL) ointment Apply 1 application topically daily.      Marland Kitchen doxycycline (DORYX) 100 MG DR capsule Take 100 mg by mouth 2 (two) times daily.      . ferrous sulfate 325 (65 FE) MG tablet Take 325 mg by mouth at bedtime.       . furosemide (LASIX) 40 MG tablet TAKE 2 TABLETS IN THE MORNING AND 1 TABLET IN THE EVENING  270 tablet  2  . guaiFENesin (MUCINEX) 600 MG 12 hr tablet Take 600 mg by mouth 2 (two) times daily as needed. congestion      . HYDROcodone-acetaminophen (VICODIN) 2.5-500 MG per tablet Take 1 tablet by mouth every 6 (six) hours as needed for pain.      Marland Kitchen KLOR-CON M20 20 MEQ tablet TAKE 1 TABLET DAILY  90  tablet  1  . LANOXIN 125 MCG tablet TAKE 1 TABLET DAILY  90 tablet  1  . nitroGLYCERIN (NITRODUR - DOSED IN MG/24 HR) 0.2 mg/hr patch Place 1 patch onto the  skin daily.      . nitroGLYCERIN (NITROSTAT) 0.4 MG SL tablet Place 0.4 mg under the tongue every 5 (five) minutes as needed for chest pain.      . predniSONE (DELTASONE) 10 MG tablet Take 10 mg by mouth daily.      Marland Kitchen PRESCRIPTION MEDICATION Uses 1L-2L of home Oxygen      . PROVENTIL HFA 108 (90 BASE) MCG/ACT inhaler INHALE 1 TO 2 PUFFS INTO THE LUNGS EVERY 6 HOURS AS NEEDED FOR SHORTNESS OF BREATH  3 each  1  . simvastatin (ZOCOR) 20 MG tablet Take 20 mg by mouth at bedtime.       Marland Kitchen tiotropium (SPIRIVA) 18 MCG inhalation capsule Place 18 mcg into inhaler and inhale daily.        No current facility-administered medications for this visit.    ROS: See HPI for pertinent positives and negatives.   Physical Examination  Filed Vitals:   03/19/14 1204  BP: 121/64  Pulse: 78  Resp: 16  Height: 5\' 7"  (1.702 m)  Weight: 162 lb (73.483 kg)  SpO2: 98%   Body mass index is 25.37 kg/(m^2).  General: A&O x 3, WDWN. Gait: slow, deliberate Eyes: PERRLA. Pulmonary: CTAB but diminished air movement in all fields, without wheezes , rales or rhonchi.Pt is using home portable oxygen/[place]. Cardiac: regular Rythm, left chest subcutaneous pacemaker.         Carotid Bruits Right Left   Negative Negative  Aorta is not palpable. Radial pulses: are 2+ palpable and =                           VASCULAR EXAM: Extremities without ischemic changes  without Gangrene; without open wounds. Both lower legs with 2+ pitting edema, knee high compression hose is use. Both forearms with several purpuric areas.                                                                                                          LE Pulses Right Left       FEMORAL  not palpable  faintly palpable        POPLITEAL  not palpable   not palpable       POSTERIOR TIBIAL  not  palpable   not palpable        DORSALIS PEDIS      ANTERIOR TIBIAL not palpable  not palpable    Abdomen: soft, NT, no masses. Skin: see extremities. Musculoskeletal: no muscle wasting or atrophy.  Neurologic: A&O X 3; Appropriate Affect ; SENSATION: normal; MOTOR FUNCTION:  moving all extremities equally, motor strength 5/5 throughout except 4/5 left leg. Speech is fluent/normal. CN 2-12 intact.    Non-Invasive Vascular Imaging: DATE: 03/19/2014 LOWER EXTREMITY ARTERIAL DUPLEX EVALUATION    INDICATION: Left thigh pain at rest and with claudication.    PREVIOUS INTERVENTION(S): Right femoral to below-knee popliteal bypass graft 03/28/2013. Left CIA stent 2009; Left CIA and right EIA stent 08/10/2010; right EIA stent 07/02/2013.    DUPLEX EXAM:  RIGHT  LEFT   Peak Systolic Velocity (cm/s) Ratio (if abnormal) Waveform  Peak Systolic Velocity (cm/s) Ratio (if abnormal) Waveform     Common Femoral Artery 233  T      Deep Femoral Artery 145  B     Superficial Femoral Artery Proximal 127  B     Superficial Femoral Artery Mid 79:279 3.53 B     Superficial Femoral Artery Distal 3  M     Popliteal Artery 12  M     Posterior Tibial Artery Dist 31  M     Anterior Tibial Artery Distal 57  M     Peroneal Artery Distal 7  M   Today's ABI / TBI   Invalid due to non-compressible vessels Previous ABI / TBI (02/23/2014  ) Invalid due to non-compressible vessels    Waveform:    M - Monophasic       B - Biphasic       T - Triphasic  If Ankle Brachial Index (ABI) or Toe Brachial Index (TBI) performed, please see complete report     ADDITIONAL FINDINGS: Patient's bilateral stents were imaged 3 weeks ago.     IMPRESSION: Calcified plaque noted throughout the left superficial femoral artery with a velocity in the mid segment suggestive of a >50% stenosis.    Compared to the previous exam:  Left CIA stent was Duplexed on 02/23/2014 and noted to be patent. There was a finding of a 295cm/s  velocity noted in the distal aorta at that time. Ankle brachial indices are not a valid comparison due to non-compressible vessels.    ASSESSMENT: JHALEN ELEY is a 78 y.o. male who is s/p right femoral to below-knee popliteal bypass graft on 03/28/2013, left CIA stent 2009, left CIA and right EIA stent 08/10/2010, and right EIA stent 07/02/2013. He developed right anterior thigh pain about 2 weeks ago on standing and sometimes at rest, and states this is different from his known sciatic pain, he denies non healing wounds other than the known sacral ulcer under treatment by wound care center. He has no signs of ischemia in either foot or leg. Left LE ABI waveforms remain tri and biphasic proximally and medially, but have become monophasic distally today compared to 3 weeks ago which were biphasic. Today's left LE arterial Duplex demonstrates calcified plaque noted throughout the left superficial femoral artery with a velocity in the mid segment suggestive of a >50% stenosis. Left CIA stent was Duplexed on 02/23/2014 and noted to be patent. There was a finding of a 295cm/s velocity noted in the distal aorta at that time. Ankle brachial indices are not a valid comparison due to non-compressible vessels.  PLAN:  I discussed in depth with the patient the nature of atherosclerosis, and emphasized the importance of maximal medical management including strict control of blood pressure, blood glucose, and lipid levels, obtaining regular exercise, and continued cessation of smoking.  The patient is aware that without maximal medical management the underlying atherosclerotic disease process will progress, limiting the benefit of any interventions.  Based on the patient's vascular studies and examination, and after discussing with Dr. Bridgett Larsson, pt will return to clinic on 03/23/14 and discuss possible diagnostic and revascularization options with Dr. Trula Slade.  The patient was given information about PAD including  signs, symptoms, treatment, what symptoms should prompt the patient to seek immediate medical care, and risk reduction measures to take.  Vinnie Level Mahala Rommel, RN, MSN, FNP-C [facility] [place]  Office Phone: [phone]  Clinic MD: Early  03/19/2014 12:19 PM

## 2014-03-19 NOTE — Patient Instructions (Signed)

## 2014-03-20 ENCOUNTER — Encounter: Payer: Self-pay | Admitting: Surgery

## 2014-03-20 DIAGNOSIS — L8992 Pressure ulcer of unspecified site, stage 2: Secondary | ICD-10-CM | POA: Diagnosis not present

## 2014-03-20 DIAGNOSIS — L89309 Pressure ulcer of unspecified buttock, unspecified stage: Secondary | ICD-10-CM | POA: Diagnosis not present

## 2014-03-23 ENCOUNTER — Other Ambulatory Visit: Payer: Self-pay

## 2014-03-23 ENCOUNTER — Encounter: Payer: Self-pay | Admitting: Surgery

## 2014-03-23 ENCOUNTER — Ambulatory Visit (INDEPENDENT_AMBULATORY_CARE_PROVIDER_SITE_OTHER): Payer: Medicare Other | Admitting: Surgery

## 2014-03-23 VITALS — BP 158/74 | HR 75 | Ht 67.0 in | Wt 162.8 lb

## 2014-03-23 DIAGNOSIS — I2510 Atherosclerotic heart disease of native coronary artery without angina pectoris: Secondary | ICD-10-CM

## 2014-03-23 DIAGNOSIS — I739 Peripheral vascular disease, unspecified: Secondary | ICD-10-CM

## 2014-03-23 NOTE — Progress Notes (Signed)
Patient name: [name] MRN: [id number] DOB: Jan 19, 1936 Sex: male     Chief Complaint  Patient presents with  . Re-evaluation    f/u to disucss revascularzation options    HISTORY OF PRESENT ILLNESS: The patient is back today for followup. He is status post right femoral to below knee popliteal artery bypass graft with Gore-Tex on 03/28/2013.  He has also undergone stenting of his right external iliac artery on 06/04/2012.  On 06/28/2012 he had a left common femoral, superficial femoral, and profunda femoral artery endarterectomy with background patch angioplasty.  On 07/02/2013, he had stenting of his right external iliac artery.  For the past 3 weeks he has been complaining of pain in his left thigh.  He states that this is like a charley horse type of pain.  He states that sitting leaning forward helps the pain.  The pain can come on at any time.  It is not necessarily associated with ambulation.  Patient has a history of ulcer disease bilaterally which have healed with compression therapy as well as revascularization.   Past Medical History  Diagnosis Date  . Hypokalemia   . Colon cancer   . PVD (peripheral vascular disease)   . HTN (hypertension)   . Automatic implantable cardiac defibrillator in situ   . CHF (congestive heart failure)   . Ventricular tachycardia   . Ischemic heart disease, chronic   . Dyslipidemia   . CAD (coronary artery disease)   . COPD (chronic obstructive pulmonary disease)   . Emphysema   . On home oxygen therapy   . Pacemaker   . Self-catheterizes urinary bladder     since back surgery.  6- 7 times a day.  . Difficult intubation     pt states "not aware of difficult intubation, never told"  . Heart murmur     dx 1974  . Stroke     TIA- 1980's  . Shortness of breath   . Diabetes mellitus without complication     boderline  . Head injury, closed, with brief LOC 2013  . Anemia   . Neurogenic bladder     since back surgery - self  caths  . Neurogenic bowel     digitally stimulates daily.    Past Surgical History  Procedure Laterality Date  . Lumbar back surgery      x 3  fusion- lumbar  . Iliac artery stent      left  . Implantation of medtronic concerto bi-v icd  07-04-70    Cristopher Peru MD  . Colonoscopy    . Polypectomy    . Insertion of iliac stent  06/04/2012    right external   . Cardiac catheterization    . Insert / replace / remove pacemaker    . Endarterectomy femoral  06/28/2012    Procedure: ENDARTERECTOMY FEMORAL;  Surgeon: Serafina Mitchell, MD;  Location: [place];  Service: Vascular;  Laterality: Left;  . Patch angioplasty  06/28/2012    Procedure: PATCH ANGIOPLASTY;  Surgeon: Serafina Mitchell, MD;  Location: [facility] OR;  Service: Vascular;  Laterality: Left;  Using 0.8 cm x 15.2 cm Hemashield Patch.  . Colon surgery  03/2004  . Eye surgery Bilateral     cataracts  . Cholecystectomy    . Coronary artery bypass graft  1998    x3 vessels  . Femoral-popliteal bypass graft Right 03/28/2013    Procedure: BYPASS GRAFT FEMORAL-BELOW KNEE POPLITEAL ARTERY Using 58mm x 80  cm Propaten Graft ,With Right Common Femoral Artery Endarterectomy.;  Surgeon: Serafina Mitchell, MD;  Location: [place];  Service: Vascular;  Laterality: Right;    History   Social History  . Marital Status: Married    Spouse Name: N/A    Number of Children: N/A  . Years of Education: N/A   Occupational History  .      Retired Social research officer, government   Social History Main Topics  . Smoking status: Former Smoker -- 3.00 packs/day for 48 years    Types: Cigarettes    Quit date: 07/03/2002  . Smokeless tobacco: Former Systems developer    Quit date: 07/11/2002  . Alcohol Use: Yes     Comment: rarely  . Drug Use: No  . Sexual Activity: Not on file   Other Topics Concern  . Not on file   Social History Narrative   Married, wife [name]   Oxygen-continuous   I/O self cath since back surgery    Family History  Problem Relation Age of  Onset  . Heart failure Father   . Diabetes Father   . Heart disease Father   . Hyperlipidemia Father   . Hypertension Father   . Heart disease Mother     before age 28  . Hyperlipidemia Mother   . Hypertension Mother     Allergies as of 03/23/2014  . (No Known Allergies)    Current Outpatient Prescriptions on File Prior to Visit  Medication Sig Dispense Refill  . acetaminophen (TYLENOL) 325 MG tablet Take 2 tablets (650 mg total) by mouth every 4 (four) hours as needed for headache or mild pain.      Marland Kitchen albuterol (PROVENTIL HFA;VENTOLIN HFA) 108 (90 BASE) MCG/ACT inhaler Inhale 2 puffs into the lungs every 6 (six) hours as needed for wheezing or shortness of breath.       Marland Kitchen albuterol (PROVENTIL) (2.5 MG/3ML) 0.083% nebulizer solution Take 2.5 mg by nebulization 4 (four) times daily.       Marland Kitchen amLODipine (NORVASC) 5 MG tablet TAKE 1 TABLET DAILY  90 tablet  1  . aspirin 325 MG tablet Take 325 mg by mouth daily.      . budesonide (PULMICORT) 0.5 MG/2ML nebulizer solution Take 0.5 mg by nebulization 2 (two) times daily.       . candesartan (ATACAND) 32 MG tablet Take 32 mg by mouth daily.       . carvedilol (COREG) 25 MG tablet Take 25 mg by mouth 2 (two) times daily with a meal.      . collagenase (SANTYL) ointment Apply 1 application topically daily.      Marland Kitchen doxycycline (DORYX) 100 MG DR capsule Take 100 mg by mouth 2 (two) times daily.      . ferrous sulfate 325 (65 FE) MG tablet Take 325 mg by mouth at bedtime.       . furosemide (LASIX) 40 MG tablet TAKE 2 TABLETS IN THE MORNING AND 1 TABLET IN THE EVENING  270 tablet  2  . guaiFENesin (MUCINEX) 600 MG 12 hr tablet Take 600 mg by mouth 2 (two) times daily as needed. congestion      . HYDROcodone-acetaminophen (VICODIN) 2.5-500 MG per tablet Take 1 tablet by mouth every 6 (six) hours as needed for pain.      Marland Kitchen KLOR-CON M20 20 MEQ tablet TAKE 1 TABLET DAILY  90 tablet  1  . LANOXIN 125 MCG tablet TAKE 1 TABLET DAILY  90 tablet  1  .  nitroGLYCERIN (NITRODUR - DOSED IN MG/24 HR) 0.2 mg/hr patch Place 1 patch onto the skin daily.      . nitroGLYCERIN (NITROSTAT) 0.4 MG SL tablet Place 0.4 mg under the tongue every 5 (five) minutes as needed for chest pain.      . predniSONE (DELTASONE) 10 MG tablet Take 10 mg by mouth daily.      Marland Kitchen PRESCRIPTION MEDICATION Uses 1L-2L of home Oxygen      . PROVENTIL HFA 108 (90 BASE) MCG/ACT inhaler INHALE 1 TO 2 PUFFS INTO THE LUNGS EVERY 6 HOURS AS NEEDED FOR SHORTNESS OF BREATH  3 each  1  . simvastatin (ZOCOR) 20 MG tablet Take 20 mg by mouth at bedtime.       Marland Kitchen tiotropium (SPIRIVA) 18 MCG inhalation capsule Place 18 mcg into inhaler and inhale daily.        No current facility-administered medications on file prior to visit.     REVIEW OF SYSTEMS: Please see history of present illness, otherwise all systems negative  PHYSICAL EXAMINATION:   Vital signs are BP 158/74  Pulse 75  Ht 5\' 7"  (1.702 m)  Wt 162 lb 12.8 oz (73.846 kg)  BMI 25.49 kg/m2  SpO2 98% General: The patient appears their stated age. HEENT:  No gross abnormalities Pulmonary:  Non labored breathing Musculoskeletal: There are no major deformities. Neurologic: No focal weakness or paresthesias are detected, Skin: There are no ulcer or rashes noted. Psychiatric: The patient has normal affect. Cardiovascular: Bilateral edema, nonpalpable pulses   Diagnostic Studies which shows a widely patent right femoral-popliteal bypass graft. I have reviewed his ultrasound from 8/24 he has triphasic waveforms on the right and monophasic on the left.  Left leg ultrasound today reveals a high-grade stenosis within the left superficial femoral artery  Assessment: Left leg pain of unknown etiology Plan: I discussed with the patient that based on his symptomatology as well as the ultrasound findings today, I do not think it is pain is related to arterial insufficiency, however given his history in the past, I cannot rule it out.   It is also conceivable that ultrasound underestimated the degree of stenosis.  Therefore, I have told the patient to continue to treat this with over-the-counter pain medicine and rest.  If he has not had any relief by October 21, I have scheduled him for an arteriogram.  I will likely cannulate the right groin and study both legs.  Ultrasound did show some potential distal aortic disease.  The superficial femoral artery stenosis on the left has been relatively stable it is likely not the source of his problem.  Eldridge Abrahams, M.D. [facility] [place] Office: [phone] Pager:  [phone]

## 2014-03-27 DIAGNOSIS — L89309 Pressure ulcer of unspecified buttock, unspecified stage: Secondary | ICD-10-CM | POA: Diagnosis not present

## 2014-03-27 DIAGNOSIS — L8992 Pressure ulcer of unspecified site, stage 2: Secondary | ICD-10-CM | POA: Diagnosis not present

## 2014-04-03 ENCOUNTER — Encounter (HOSPITAL_BASED_OUTPATIENT_CLINIC_OR_DEPARTMENT_OTHER): Payer: Medicare Other | Attending: General Surgery

## 2014-04-03 ENCOUNTER — Telehealth: Payer: Self-pay

## 2014-04-03 DIAGNOSIS — L89329 Pressure ulcer of left buttock, unspecified stage: Secondary | ICD-10-CM | POA: Insufficient documentation

## 2014-04-03 DIAGNOSIS — L89152 Pressure ulcer of sacral region, stage 2: Secondary | ICD-10-CM | POA: Insufficient documentation

## 2014-04-03 NOTE — Telephone Encounter (Signed)
Patient called to cancel his aortogram that is scheduled with Dr. Trula Slade for Wednesday, October 28th. Patient states that Dr. Trula Slade told him to cancel this procedure if over the counter pain medication helped relieve some of his pain, that it may be back related. Patient states that he has an appointment with a "back doctor" October 15th. Instructed patient to call if he had any other concerns or questions.

## 2014-04-10 DIAGNOSIS — L89329 Pressure ulcer of left buttock, unspecified stage: Secondary | ICD-10-CM | POA: Diagnosis not present

## 2014-04-10 DIAGNOSIS — L89152 Pressure ulcer of sacral region, stage 2: Secondary | ICD-10-CM | POA: Diagnosis not present

## 2014-04-13 ENCOUNTER — Encounter: Payer: Self-pay | Admitting: *Deleted

## 2014-04-13 ENCOUNTER — Ambulatory Visit (INDEPENDENT_AMBULATORY_CARE_PROVIDER_SITE_OTHER): Payer: Medicare Other | Admitting: *Deleted

## 2014-04-13 DIAGNOSIS — I5022 Chronic systolic (congestive) heart failure: Secondary | ICD-10-CM

## 2014-04-13 DIAGNOSIS — Z95810 Presence of automatic (implantable) cardiac defibrillator: Secondary | ICD-10-CM

## 2014-04-13 NOTE — Progress Notes (Addendum)
EPIC Encounter for ICM Monitoring  Patient Name: [name] is a 78 y.o. male Date: 04/13/2014 Primary Care Physican: Valaria Good, MD Primary Cardiologist: Tamala Julian Electrophysiologist: Lovena Le Dry Weight: 152.5 lbs       In the past month, have you:  1. Gained more than 2 pounds in a day or more than 5 pounds in a week? No. Baseline weight has dropped from 158 lbs to 152.5 lbs due to the patient's dieting efforts.  2. Had changes in your medications (with verification of current medications)? Yes. On doxycycline for non- healing sacral wounds. + MRSA.  3. Had more shortness of breath than is usual for you? no  4. Limited your activity because of shortness of breath? no  5. Not been able to sleep because of shortness of breath? no  6. Had increased swelling in your feet or ankles? No. He will occasionally have some swelling to the right lower extremity, but has had surgery on this leg in the past. He will wrap his leg when he notices swelling and this seems to work well for him.  7. Had symptoms of dehydration (dizziness, dry mouth, increased thirst, decreased urine output) no  8. Had changes in sodium restriction? no  9. Been compliant with medication? Yes  ** Note- the patient is pending a consultation with Dr. Johnathan Hausen to discuss surgery to promote wound healing to sacral ulcers. He is unsure of the date .**   ICM trend:   Follow-up plan: ICM clinic phone appointment: 05/04/14- full transmission  Copy of note sent to patient's primary care physician, primary cardiologist, and device following physician.  Alvis Lemmings, RN, BSN 04/13/2014 10:52 AM

## 2014-04-17 DIAGNOSIS — L89152 Pressure ulcer of sacral region, stage 2: Secondary | ICD-10-CM | POA: Diagnosis not present

## 2014-04-17 DIAGNOSIS — L89329 Pressure ulcer of left buttock, unspecified stage: Secondary | ICD-10-CM | POA: Diagnosis not present

## 2014-04-23 ENCOUNTER — Other Ambulatory Visit: Payer: Self-pay | Admitting: Pulmonary Disease

## 2014-04-24 DIAGNOSIS — L89329 Pressure ulcer of left buttock, unspecified stage: Secondary | ICD-10-CM | POA: Diagnosis not present

## 2014-04-24 DIAGNOSIS — L89152 Pressure ulcer of sacral region, stage 2: Secondary | ICD-10-CM | POA: Diagnosis not present

## 2014-04-28 ENCOUNTER — Other Ambulatory Visit: Payer: Self-pay | Admitting: Internal Medicine

## 2014-04-29 ENCOUNTER — Ambulatory Visit (HOSPITAL_COMMUNITY): Admission: RE | Admit: 2014-04-29 | Payer: Medicare Other | Source: Ambulatory Visit | Admitting: Surgery

## 2014-04-29 ENCOUNTER — Encounter (HOSPITAL_COMMUNITY): Admission: RE | Payer: Self-pay | Source: Ambulatory Visit

## 2014-04-29 SURGERY — ABDOMINAL AORTAGRAM
Anesthesia: LOCAL

## 2014-05-08 ENCOUNTER — Encounter (HOSPITAL_BASED_OUTPATIENT_CLINIC_OR_DEPARTMENT_OTHER): Payer: Medicare Other | Attending: General Surgery

## 2014-05-08 DIAGNOSIS — L89152 Pressure ulcer of sacral region, stage 2: Secondary | ICD-10-CM | POA: Insufficient documentation

## 2014-05-14 ENCOUNTER — Ambulatory Visit (INDEPENDENT_AMBULATORY_CARE_PROVIDER_SITE_OTHER): Payer: Medicare Other | Admitting: *Deleted

## 2014-05-14 ENCOUNTER — Ambulatory Visit (INDEPENDENT_AMBULATORY_CARE_PROVIDER_SITE_OTHER): Payer: Self-pay | Admitting: Surgery

## 2014-05-14 ENCOUNTER — Encounter: Payer: Self-pay | Admitting: Internal Medicine

## 2014-05-14 DIAGNOSIS — I255 Ischemic cardiomyopathy: Secondary | ICD-10-CM

## 2014-05-14 LAB — MDC_IDC_ENUM_SESS_TYPE_REMOTE
Battery Remaining Longevity: 79 mo
Battery Voltage: 2.97 V
Brady Statistic AP VP Percent: 0 %
Brady Statistic AS VP Percent: 65.32 %
Brady Statistic RA Percent Paced: 0 %
HIGH POWER IMPEDANCE MEASURED VALUE: 48 Ohm
HighPow Impedance: 56 Ohm
Lead Channel Impedance Value: 4047 Ohm
Lead Channel Impedance Value: 4047 Ohm
Lead Channel Impedance Value: 532 Ohm
Lead Channel Pacing Threshold Amplitude: 0.5 V
Lead Channel Pacing Threshold Amplitude: 1 V
Lead Channel Pacing Threshold Pulse Width: 0.4 ms
Lead Channel Pacing Threshold Pulse Width: 0.4 ms
Lead Channel Pacing Threshold Pulse Width: 0.4 ms
Lead Channel Setting Pacing Amplitude: 2.5 V
Lead Channel Setting Pacing Pulse Width: 0.4 ms
MDC IDC MSMT LEADCHNL LV IMPEDANCE VALUE: 513 Ohm
MDC IDC MSMT LEADCHNL RA PACING THRESHOLD AMPLITUDE: 0.5 V
MDC IDC MSMT LEADCHNL RA SENSING INTR AMPL: 0.875 mV
MDC IDC MSMT LEADCHNL RV IMPEDANCE VALUE: 342 Ohm
MDC IDC MSMT LEADCHNL RV IMPEDANCE VALUE: 418 Ohm
MDC IDC MSMT LEADCHNL RV SENSING INTR AMPL: 21.375 mV
MDC IDC SESS DTM: 20151112134554
MDC IDC SET LEADCHNL LV PACING AMPLITUDE: 2 V
MDC IDC SET LEADCHNL RV PACING PULSEWIDTH: 0.4 ms
MDC IDC SET LEADCHNL RV SENSING SENSITIVITY: 0.3 mV
MDC IDC SET ZONE DETECTION INTERVAL: 340 ms
MDC IDC SET ZONE DETECTION INTERVAL: 430 ms
MDC IDC STAT BRADY AP VS PERCENT: 0 %
MDC IDC STAT BRADY AS VS PERCENT: 34.68 %
MDC IDC STAT BRADY RV PERCENT PACED: 68.19 %
Zone Setting Detection Interval: 300 ms
Zone Setting Detection Interval: 350 ms
Zone Setting Detection Interval: 460 ms

## 2014-05-14 NOTE — H&P (Signed)
[name] 05/14/2014 9:23 AM Location: [facility] Patient #: [id number] DOB: 24-Jan-1936 Married / Language: [name] / Race: White Male History of Present Illness [name] Done MD; 05/14/2014 9:57 AM) Patient words: sacral ulcer 78 year old man on whom I did a colectomy about 10 years ago (no info in [place]) who has had multiple medical issues: COPD from chronic smoking, peripheral vascular disease from smoking, lower back surgery with some problems with neuropathy creating the current problem. About a year ago during his vascular reconstruction he developed a pressure sore over his sacrum. This is an area that he can't feel. He also has trouble getting his bowels move and has to disimpact himself manually. I had spoken with [name] about him. He thinks that excision and primary closure is the way to go.  The patient is a 78 year old male   Other Problems [name], [place]; 05/14/2014 9:32 AM) Atrial Fibrillation Back Pain Bladder Problems Cerebrovascular Accident Chronic Obstructive Lung Disease Colon Cancer Congestive Heart Failure Heart murmur High blood pressure Home Oxygen Use Hypercholesterolemia Vascular Disease  Past Surgical History [name], [place]; 05/14/2014 9:32 AM) Bypass Surgery for Poor Blood Flow to Legs Colon Polyp Removal - Colonoscopy Colon Polyp Removal - Open Colon Removal - Partial Coronary Artery Bypass Graft Spinal Surgery - Lower Back Spinal Surgery Midback  Diagnostic Studies History [name], CMA; 05/14/2014 9:32 AM) Colonoscopy 1-5 years ago  Allergies ([place], [place]; 05/14/2014 9:28 AM) No Known Drug Allergies 05/14/2014  Medication History ([name], CMA; 05/14/2014 9:32 AM) Doxycycline Hyclate (100MG  Capsule, Oral) Active. Nitrostat (0.4MG  Tab Sublingual, Sublingual as needed) Active. Hydrocodone-Acetaminophen (5-300MG  Tablet, Oral as needed) Active. Albuterol Sulfate  (0.63MG /3ML Nebulized Soln, Inhalation as needed) Active. Aspirin (81MG  Tablet, Oral) Active. AmLODIPine Besylate (5MG  Tablet, Oral) Active. Pulmicort (0.5MG /2ML Suspension, Inhalation as needed) Active. Atacand (32MG  Tablet, Oral) Active. Carvedilol (25MG  Tablet, Oral) Active. Ferrous Sulfate (325 (65 Fe)MG Tablet, Oral) Active. [facility] Packet, Oral) Active. Lanoxin (125MCG Tablet, Oral) Active. Simvastatin (20MG  Tablet, Oral) Active. Spiriva HandiHaler (18MCG Capsule, Inhalation) Active. Santyl (250UNIT/GM Ointment, External) Active.  Social History [name], [place]; 05/14/2014 9:32 AM) Alcohol use Occasional alcohol use. Caffeine use Coffee, Tea. No drug use Tobacco use Former smoker.  Family History [name], CMA; 05/14/2014 9:32 AM) Heart Disease Brother, Father, Mother. Hypertension Brother, Father, Mother. Respiratory Condition Father.     Review of Systems ([place]; 05/14/2014 9:32 AM) General Present- Fatigue and Weight Loss. Not Present- Appetite Loss, Chills, Fever, Night Sweats and Weight Gain. Skin Present- Change in Wart/Mole, Dryness, Non-Healing Wounds and Ulcer. Not Present- Hives, Jaundice, New Lesions and Rash. HEENT Not Present- Earache, Hearing Loss, Hoarseness, Nose Bleed, Oral Ulcers, Ringing in the Ears, Seasonal Allergies, Sinus Pain, Sore Throat, Visual Disturbances, Wears glasses/contact lenses and Yellow Eyes. Respiratory Present- Difficulty Breathing. Not Present- Bloody sputum, Chronic Cough, Snoring and Wheezing. Cardiovascular Present- Shortness of Breath and Swelling of Extremities. Not Present- Chest Pain, Difficulty Breathing Lying Down, Leg Cramps, Palpitations and Rapid Heart Rate. Gastrointestinal Present- Change in Bowel Habits, Difficulty Swallowing and Indigestion. Not Present- Abdominal Pain, Bloating, Bloody Stool, Chronic diarrhea, Constipation, Excessive gas, Gets full quickly at meals, Hemorrhoids,  Nausea, Rectal Pain and Vomiting. Male Genitourinary Present- Impotence. Not Present- Blood in Urine, Change in Urinary Stream, Frequency, Nocturia, Painful Urination, Urgency and Urine Leakage.  Vitals ([name] CMA; 05/14/2014 9:33 AM) 05/14/2014 9:33 AM Weight: 151 lb Height: 67in Body Surface Area: 1.8 m Body Mass Index:  23.65 kg/m Temp.: 34F(Temporal)  Pulse: 89 (Regular)  BP: 138/80 (Sitting, Left Arm, Standard)     Physical Exam ([place] B. Hassell Done MD; 05/14/2014 10:04 AM)  The physical exam findings are as follows: Note:Elderly white male seated in no acute distress wearing home oxygen. HEENT: negative Neck: no bruits Chest: clear Heart: implantable defibrillator in place Abdomen old incision has healed without problems Back: sacral ulcer in the midline. it is about 3 inches long but only 1 cm wide. Neuro alert and oriented 3. Motor and sensory function affected by his back surgery.    Assessment & Plan [name] Done MD; 05/14/2014 10:06 AM)  SACRAL DECUBITUS ULCER, STAGE I (707.03  L89.151) Impression: plan excision and primary closure under general

## 2014-05-14 NOTE — Anesthesia Preprocedure Evaluation (Addendum)
Anesthesia Evaluation  Patient identified by MRN, date of birth, ID band Patient awake    Reviewed: Allergy & Precautions, H&P , NPO status , Patient's Chart, lab work & pertinent test results  Airway Mallampati: III  TM Distance: <3 FB Neck ROM: Limited    Dental no notable dental hx.    Pulmonary shortness of breath, COPD oxygen dependent, former smoker,  breath sounds clear to auscultation  Pulmonary exam normal       Cardiovascular hypertension, + Peripheral Vascular Disease and +CHF + pacemaker + Cardiac Defibrillator Rhythm:Regular Rate:Normal  Study Conclusions  - Left ventricle: The cavity size was severely dilated. Systolic function was severely reduced. The estimated ejection fraction was in the range of 20% to 25%. Diffuse hypokinesis. - Mitral valve: Moderate to severe regurgitation. - Left atrium: The atrium was moderately dilated. - Right ventricle: Systolic function was moderately reduced. - Right atrium: The atrium was mildly dilated. - Pulmonary arteries: Systolic pressure was mildly increased. PA peak pressure: 66mm Hg (S).    Neuro/Psych negative neurological ROS  negative psych ROS   GI/Hepatic negative GI ROS, Neg liver ROS,   Endo/Other  diabetes, Type 2  Renal/GU negative Renal ROS  negative genitourinary   Musculoskeletal negative musculoskeletal ROS (+)   Abdominal   Peds negative pediatric ROS (+)  Hematology negative hematology ROS (+)   Anesthesia Other Findings   Reproductive/Obstetrics negative OB ROS                           Anesthesia Physical Anesthesia Plan  ASA: IV  Anesthesia Plan: General   Post-op Pain Management:    Induction: Intravenous  Airway Management Planned: Oral ETT  Additional Equipment:   Intra-op Plan:   Post-operative Plan: Possible Post-op intubation/ventilation  Informed Consent: I have reviewed the patients  History and Physical, chart, labs and discussed the procedure including the risks, benefits and alternatives for the proposed anesthesia with the patient or authorized representative who has indicated his/her understanding and acceptance.   Dental advisory given  Plan Discussed with: CRNA and Surgeon  Anesthesia Plan Comments: (Due to severe lung disease, I discussed with patient possibility of requiring mechanical ventilation post procedure)        Anesthesia Quick Evaluation

## 2014-05-15 ENCOUNTER — Encounter (HOSPITAL_COMMUNITY): Payer: Self-pay

## 2014-05-15 ENCOUNTER — Encounter (HOSPITAL_COMMUNITY)
Admission: RE | Admit: 2014-05-15 | Discharge: 2014-05-15 | Disposition: A | Discharge status: Home / Self Care | Payer: Medicare Other | Source: Ambulatory Visit | Attending: Surgery | Admitting: Surgery

## 2014-05-15 LAB — BASIC METABOLIC PANEL
Anion gap: 9 (ref 5–15)
BUN: 32 mg/dL — AB (ref 6–23)
CO2: 32 mEq/L (ref 19–32)
CREATININE: 1.48 mg/dL — AB (ref 0.50–1.35)
Calcium: 11.8 mg/dL — ABNORMAL HIGH (ref 8.4–10.5)
Chloride: 95 mEq/L — ABNORMAL LOW (ref 96–112)
GFR calc non Af Amer: 44 mL/min — ABNORMAL LOW (ref >90)
GFR, EST AFRICAN AMERICAN: 51 mL/min — AB (ref >90)
GLUCOSE: 170 mg/dL — AB (ref 70–99)
Potassium: 6.2 mEq/L — ABNORMAL HIGH (ref 3.7–5.3)
Sodium: 136 mEq/L — ABNORMAL LOW (ref 137–147)

## 2014-05-15 LAB — CBC
HEMATOCRIT: 33.7 % — AB (ref 39.0–52.0)
HEMOGLOBIN: 10.5 g/dL — AB (ref 13.0–17.0)
MCH: 29.8 pg (ref 26.0–34.0)
MCHC: 31.2 g/dL (ref 30.0–36.0)
MCV: 95.7 fL (ref 78.0–100.0)
Platelets: 193 10*3/uL (ref 150–400)
RBC: 3.52 MIL/uL — ABNORMAL LOW (ref 4.22–5.81)
RDW: 14.3 % (ref 11.5–15.5)
WBC: 11.2 10*3/uL — ABNORMAL HIGH (ref 4.0–10.5)

## 2014-05-15 NOTE — Progress Notes (Signed)
bmet results routed to dr Johnathan Hausen inbasket by epic

## 2014-05-15 NOTE — Progress Notes (Signed)
   05/15/14 0935  OBSTRUCTIVE SLEEP APNEA  Have you ever been diagnosed with sleep apnea through a sleep study? No  Do you snore loudly (loud enough to be heard through closed doors)?  0  Do you often feel tired, fatigued, or sleepy during the daytime? 0  Has anyone observed you stop breathing during your sleep? 0  Do you have, or are you being treated for high blood pressure? 1  BMI more than 35 kg/m2? 0  Age over 77 years old? 1  Neck circumference greater than 40 cm/16 inches? 1  Gender: 1  Obstructive Sleep Apnea Score 4

## 2014-05-15 NOTE — Patient Instructions (Signed)
STOP ASPIRIN, HERBALS, BLOOD THINNERS 5 DAYS BEFORE SURGERY.          FLEETS ENEMA THE NIGHT BEFORE SURGERY   YOUR SURGERY IS SCHEDULED AT [place]  ON:  Monday  November 16  REPORT TO  SHORT STAY CENTER AT:  5:30 AM   DO NOT EAT OR DRINK ANYTHING AFTER MIDNIGHT THE NIGHT BEFORE YOUR SURGERY.  YOU MAY BRUSH YOUR TEETH, RINSE OUT YOUR MOUTH--BUT NO WATER, NO FOOD, NO CHEWING GUM, NO MINTS, NO CANDIES, NO CHEWING TOBACCO.   PLEASE TAKE THE FOLLOWING MEDICATIONS THE AM OF YOUR SURGERY WITH A FEW SIPS OF WATER:  AMLODIPINE,   CARVEDILOL,   LANOXIN,   PREDNISONE.       WEAR NITRO DUR PATCH.  BRING NITROGLYCERIN TABLETS.  MAY TAKE HYDROCODONE / ACETAMINOPHEN IF NEEDED FOR PAIN.        USE YOUR [place] AND OTHER INHALERS AND NEBULIZER'S BEFORE COMING TO HOSPITAL.    DO NOT BRING VALUABLES, MONEY, CREDIT CARDS.  DO NOT WEAR JEWELRY, MAKE-UP, NAIL POLISH AND NO METAL PINS OR CLIPS IN YOUR HAIR. CONTACT LENS, DENTURES / PARTIALS, GLASSES SHOULD NOT BE WORN TO SURGERY AND IN MOST CASES-HEARING AIDS WILL NEED TO BE REMOVED.  BRING YOUR GLASSES CASE, ANY EQUIPMENT NEEDED FOR YOUR CONTACT LENS. FOR PATIENTS ADMITTED TO THE HOSPITAL--CHECK OUT TIME THE DAY OF DISCHARGE IS 11:00 AM.  ALL INPATIENT ROOMS ARE PRIVATE - WITH BATHROOM, TELEPHONE, TELEVISION AND WIFI INTERNET.   PLEASE BE AWARE THAT YOU MAY NEED ADDITIONAL BLOOD DRAWN DAY OF YOUR SURGERY  _______________________________________________________________________   [facility] - Preparing for Surgery Before surgery, you can play an important role.  Because skin is not sterile, your skin needs to be as free of germs as possible.  You can reduce the number of germs on your skin by washing with CHG (chlorahexidine gluconate) soap before surgery.  CHG is an antiseptic cleaner which kills germs and bonds with the skin to continue killing germs even after washing. Please DO NOT use if you have an allergy to CHG or antibacterial  soaps.  If your skin becomes reddened/irritated stop using the CHG and inform your nurse when you arrive at Short Stay. Do not shave (including legs and underarms) for at least 48 hours prior to the first CHG shower.  You may shave your face/neck. Please follow these instructions carefully:  1.  Shower with CHG Soap the night before surgery and the  morning of Surgery.  2.  If you choose to wash your hair, wash your hair first as usual with your  normal  shampoo.  3.  After you shampoo, rinse your hair and body thoroughly to remove the  shampoo.                           4.  Use CHG as you would any other liquid soap.  You can apply chg directly  to the skin and wash                       Gently with a scrungie or clean washcloth.  5.  Apply the CHG Soap to your body ONLY FROM THE NECK DOWN.   Do not use on face/ open                           Wound or open sores. Avoid contact with eyes,  ears mouth and genitals (private parts).                       Wash face,  Genitals (private parts) with your normal soap.             6.  Wash thoroughly, paying special attention to the area where your surgery  will be performed.  7.  Thoroughly rinse your body with warm water from the neck down.  8.  DO NOT shower/wash with your normal soap after using and rinsing off  the CHG Soap.                9.  Pat yourself dry with a clean towel.            10.  Wear clean pajamas.            11.  Place clean sheets on your bed the night of your first shower and do not  sleep with pets. Day of Surgery : Do not apply any lotions/deodorants the morning of surgery.  Please wear clean clothes to the hospital/surgery center.  FAILURE TO FOLLOW THESE INSTRUCTIONS MAY RESULT IN THE CANCELLATION OF YOUR SURGERY PATIENT SIGNATURE_________________________________  NURSE SIGNATURE__________________________________  ________________________________________________________________________

## 2014-05-15 NOTE — Progress Notes (Signed)
Remote ICD transmission.   

## 2014-05-15 NOTE — Pre-Procedure Instructions (Signed)
Ct angio chest report and cxr report are in epic from 08-08-13. ekg report 02-05-14 with cardiology office note Dr. Beckie Salts in epic. Echocardiogram report in epic from 08/08/13. ANESTHESIA CONSULT TODAY BY DR. Jillyn Hidden PERIOPERATIVE PRESCRIPTION FOR IMPLANTED CARDIAC DEVICE PROGRAMMING HAS BEEN REQUESTED FROM DR. Marney Doctor - ORDERS NOT YET RECEIVED. PT'S CHART GIVEN TO FOLLOW UP NURSE SHARON SHOFFNER, RN FOR REVIEW OF ALL PREOP TEST RESULTS.

## 2014-05-18 ENCOUNTER — Inpatient Hospital Stay (HOSPITAL_COMMUNITY)
Admission: RE | Admit: 2014-05-18 | Discharge: 2014-05-19 | DRG: 572 | Disposition: A | Discharge status: Home / Self Care | Payer: Medicare Other | Source: Ambulatory Visit | Attending: Surgery | Admitting: Surgery

## 2014-05-18 ENCOUNTER — Inpatient Hospital Stay (HOSPITAL_COMMUNITY): Payer: Medicare Other | Admitting: Anesthesiology

## 2014-05-18 ENCOUNTER — Encounter (HOSPITAL_COMMUNITY)
Admission: RE | Disposition: A | Discharge status: Home / Self Care | Payer: Self-pay | Source: Ambulatory Visit | Attending: Surgery

## 2014-05-18 ENCOUNTER — Encounter (HOSPITAL_COMMUNITY): Payer: Self-pay | Admitting: *Deleted

## 2014-05-18 DIAGNOSIS — J449 Chronic obstructive pulmonary disease, unspecified: Secondary | ICD-10-CM | POA: Diagnosis present

## 2014-05-18 DIAGNOSIS — E780 Pure hypercholesterolemia: Secondary | ICD-10-CM | POA: Diagnosis present

## 2014-05-18 DIAGNOSIS — L8990 Pressure ulcer of unspecified site, unspecified stage: Secondary | ICD-10-CM | POA: Diagnosis present

## 2014-05-18 DIAGNOSIS — Z79899 Other long term (current) drug therapy: Secondary | ICD-10-CM

## 2014-05-18 DIAGNOSIS — Z01812 Encounter for preprocedural laboratory examination: Secondary | ICD-10-CM | POA: Diagnosis not present

## 2014-05-18 DIAGNOSIS — Z7982 Long term (current) use of aspirin: Secondary | ICD-10-CM

## 2014-05-18 DIAGNOSIS — G629 Polyneuropathy, unspecified: Secondary | ICD-10-CM | POA: Diagnosis present

## 2014-05-18 DIAGNOSIS — Z951 Presence of aortocoronary bypass graft: Secondary | ICD-10-CM

## 2014-05-18 DIAGNOSIS — L89159 Pressure ulcer of sacral region, unspecified stage: Secondary | ICD-10-CM | POA: Diagnosis present

## 2014-05-18 DIAGNOSIS — Z87891 Personal history of nicotine dependence: Secondary | ICD-10-CM

## 2014-05-18 DIAGNOSIS — L89151 Pressure ulcer of sacral region, stage 1: Principal | ICD-10-CM | POA: Diagnosis present

## 2014-05-18 DIAGNOSIS — I739 Peripheral vascular disease, unspecified: Secondary | ICD-10-CM | POA: Diagnosis present

## 2014-05-18 DIAGNOSIS — Z9981 Dependence on supplemental oxygen: Secondary | ICD-10-CM | POA: Diagnosis not present

## 2014-05-18 DIAGNOSIS — Z9049 Acquired absence of other specified parts of digestive tract: Secondary | ICD-10-CM | POA: Diagnosis present

## 2014-05-18 DIAGNOSIS — E118 Type 2 diabetes mellitus with unspecified complications: Secondary | ICD-10-CM | POA: Diagnosis present

## 2014-05-18 HISTORY — PX: DEBRIDMENT OF DECUBITUS ULCER: SHX6276

## 2014-05-18 LAB — CREATININE, SERUM
CREATININE: 1.91 mg/dL — AB (ref 0.50–1.35)
GFR calc Af Amer: 38 mL/min — ABNORMAL LOW (ref >90)
GFR, EST NON AFRICAN AMERICAN: 32 mL/min — AB (ref >90)

## 2014-05-18 LAB — POCT I-STAT EG7
ACID-BASE EXCESS: 4 mmol/L — AB (ref 0.0–2.0)
BICARBONATE: 29.4 meq/L — AB (ref 20.0–24.0)
Calcium, Ion: 1.34 mmol/L — ABNORMAL HIGH (ref 1.13–1.30)
HCT: 30 % — ABNORMAL LOW (ref 39.0–52.0)
Hemoglobin: 10.2 g/dL — ABNORMAL LOW (ref 13.0–17.0)
O2 Saturation: 86 %
POTASSIUM: 4.8 meq/L (ref 3.7–5.3)
Patient temperature: 37
SODIUM: 132 meq/L — AB (ref 137–147)
TCO2: 31 mmol/L (ref 0–100)
pCO2, Ven: 49.2 mmHg (ref 45.0–50.0)
pH, Ven: 7.385 — ABNORMAL HIGH (ref 7.250–7.300)
pO2, Ven: 53 mmHg — ABNORMAL HIGH (ref 30.0–45.0)

## 2014-05-18 LAB — CBC
HCT: 30.5 % — ABNORMAL LOW (ref 39.0–52.0)
Hemoglobin: 9.8 g/dL — ABNORMAL LOW (ref 13.0–17.0)
MCH: 30.2 pg (ref 26.0–34.0)
MCHC: 32.1 g/dL (ref 30.0–36.0)
MCV: 93.8 fL (ref 78.0–100.0)
Platelets: 172 10*3/uL (ref 150–400)
RBC: 3.25 MIL/uL — ABNORMAL LOW (ref 4.22–5.81)
RDW: 14.3 % (ref 11.5–15.5)
WBC: 7.8 10*3/uL (ref 4.0–10.5)

## 2014-05-18 LAB — GLUCOSE, CAPILLARY: Glucose-Capillary: 197 mg/dL — ABNORMAL HIGH (ref 70–99)

## 2014-05-18 SURGERY — DEBRIDMENT OF DECUBITUS ULCER
Anesthesia: General

## 2014-05-18 MED ORDER — CEFOXITIN SODIUM 2 G IV SOLR
INTRAVENOUS | Status: AC
  Filled 2014-05-18: qty 2

## 2014-05-18 MED ORDER — MORPHINE SULFATE 2 MG/ML IJ SOLN
1.0000 mg | INTRAMUSCULAR | Status: DC | PRN
Start: 2014-05-18 — End: 2014-05-19

## 2014-05-18 MED ORDER — ATROPINE SULFATE 0.4 MG/ML IJ SOLN
INTRAMUSCULAR | Status: AC
  Filled 2014-05-18: qty 1

## 2014-05-18 MED ORDER — POTASSIUM CHLORIDE CRYS ER 10 MEQ PO TBCR
10.0000 meq | EXTENDED_RELEASE_TABLET | Freq: Every day | ORAL | Status: DC
  Administered 2014-05-18: 10 meq via ORAL
  Filled 2014-05-18 (×2): qty 1

## 2014-05-18 MED ORDER — AMLODIPINE BESYLATE 5 MG PO TABS
5.0000 mg | ORAL_TABLET | Freq: Every day | ORAL | Status: DC
  Filled 2014-05-18 (×2): qty 1

## 2014-05-18 MED ORDER — PROPOFOL 10 MG/ML IV BOLUS
INTRAVENOUS | Status: AC
  Filled 2014-05-18: qty 20

## 2014-05-18 MED ORDER — SODIUM CHLORIDE 0.9 % IV SOLN
INTRAVENOUS | Status: DC | PRN
  Administered 2014-05-18 (×2): via INTRAVENOUS

## 2014-05-18 MED ORDER — PROMETHAZINE HCL 25 MG/ML IJ SOLN: 6.2500 mg | INTRAMUSCULAR | Status: DC | PRN

## 2014-05-18 MED ORDER — CHLORHEXIDINE GLUCONATE 4 % EX LIQD: 1.0000 "application " | Freq: Once | CUTANEOUS | Status: DC

## 2014-05-18 MED ORDER — BUPIVACAINE HCL (PF) 0.25 % IJ SOLN
INTRAMUSCULAR | Status: AC
  Filled 2014-05-18: qty 30

## 2014-05-18 MED ORDER — PREDNISONE 10 MG PO TABS
10.0000 mg | ORAL_TABLET | Freq: Every day | ORAL | Status: DC
  Filled 2014-05-18: qty 1

## 2014-05-18 MED ORDER — LIDOCAINE HCL (CARDIAC) 20 MG/ML IV SOLN
INTRAVENOUS | Status: DC | PRN
  Administered 2014-05-18: 50 mg via INTRAVENOUS

## 2014-05-18 MED ORDER — ONDANSETRON HCL 4 MG PO TABS: 4.0000 mg | ORAL_TABLET | Freq: Four times a day (QID) | ORAL | Status: DC | PRN

## 2014-05-18 MED ORDER — TIOTROPIUM BROMIDE MONOHYDRATE 18 MCG IN CAPS
18.0000 ug | ORAL_CAPSULE | Freq: Every day | RESPIRATORY_TRACT | Status: DC
  Administered 2014-05-19: 18 ug via RESPIRATORY_TRACT
  Filled 2014-05-18: qty 5

## 2014-05-18 MED ORDER — ONDANSETRON HCL 4 MG/2ML IJ SOLN
INTRAMUSCULAR | Status: AC
  Filled 2014-05-18: qty 2

## 2014-05-18 MED ORDER — 0.9 % SODIUM CHLORIDE (POUR BTL) OPTIME
TOPICAL | Status: DC | PRN
  Administered 2014-05-18: 1000 mL

## 2014-05-18 MED ORDER — HEPARIN SODIUM (PORCINE) 5000 UNIT/ML IJ SOLN
5000.0000 [IU] | Freq: Three times a day (TID) | INTRAMUSCULAR | Status: DC
  Administered 2014-05-18: 5000 [IU] via SUBCUTANEOUS
  Filled 2014-05-18 (×5): qty 1

## 2014-05-18 MED ORDER — MIDAZOLAM HCL 2 MG/2ML IJ SOLN
INTRAMUSCULAR | Status: AC
  Filled 2014-05-18: qty 2

## 2014-05-18 MED ORDER — EPHEDRINE SULFATE 50 MG/ML IJ SOLN
INTRAMUSCULAR | Status: AC
  Filled 2014-05-18: qty 1

## 2014-05-18 MED ORDER — CARVEDILOL 25 MG PO TABS
25.0000 mg | ORAL_TABLET | Freq: Two times a day (BID) | ORAL | Status: DC
Start: 2014-05-18 — End: 2014-05-19
  Administered 2014-05-18 – 2014-05-19 (×2): 25 mg via ORAL
  Filled 2014-05-18 (×4): qty 1

## 2014-05-18 MED ORDER — HEPARIN SODIUM (PORCINE) 5000 UNIT/ML IJ SOLN
5000.0000 [IU] | Freq: Once | INTRAMUSCULAR | Status: AC
  Administered 2014-05-18: 5000 [IU] via SUBCUTANEOUS
  Filled 2014-05-18: qty 1

## 2014-05-18 MED ORDER — OXYCODONE-ACETAMINOPHEN 5-325 MG PO TABS
1.0000 | ORAL_TABLET | ORAL | Status: DC | PRN
  Administered 2014-05-18 – 2014-05-19 (×2): 2 via ORAL
  Administered 2014-05-19: 1 via ORAL
  Filled 2014-05-18 (×2): qty 2
  Filled 2014-05-18: qty 1

## 2014-05-18 MED ORDER — NITROGLYCERIN 0.4 MG SL SUBL: 0.4000 mg | SUBLINGUAL_TABLET | SUBLINGUAL | Status: DC | PRN

## 2014-05-18 MED ORDER — BUPIVACAINE HCL 0.25 % IJ SOLN
INTRAMUSCULAR | Status: DC | PRN
  Administered 2014-05-18: 10 mL

## 2014-05-18 MED ORDER — ASPIRIN EC 325 MG PO TBEC
325.0000 mg | DELAYED_RELEASE_TABLET | Freq: Every day | ORAL | Status: DC
  Administered 2014-05-18: 325 mg via ORAL
  Filled 2014-05-18 (×2): qty 1

## 2014-05-18 MED ORDER — FENTANYL CITRATE 0.05 MG/ML IJ SOLN
INTRAMUSCULAR | Status: DC | PRN
  Administered 2014-05-18 (×2): 25 ug via INTRAVENOUS

## 2014-05-18 MED ORDER — SODIUM CHLORIDE 0.9 % IV SOLN
10.0000 mg | INTRAVENOUS | Status: DC | PRN
  Administered 2014-05-18: 20 ug/min via INTRAVENOUS

## 2014-05-18 MED ORDER — FUROSEMIDE 40 MG PO TABS
40.0000 mg | ORAL_TABLET | Freq: Two times a day (BID) | ORAL | Status: DC
  Administered 2014-05-18 – 2014-05-19 (×2): 40 mg via ORAL
  Filled 2014-05-18 (×4): qty 1

## 2014-05-18 MED ORDER — KCL IN DEXTROSE-NACL 20-5-0.45 MEQ/L-%-% IV SOLN
INTRAVENOUS | Status: DC
  Administered 2014-05-18: 12:00:00 via INTRAVENOUS
  Administered 2014-05-19: 1 mL via INTRAVENOUS
  Filled 2014-05-18 (×2): qty 1000

## 2014-05-18 MED ORDER — SUCCINYLCHOLINE CHLORIDE 20 MG/ML IJ SOLN
INTRAMUSCULAR | Status: DC | PRN
  Administered 2014-05-18: 80 mg via INTRAVENOUS

## 2014-05-18 MED ORDER — DEXTROSE 5 % IV SOLN
2.0000 g | INTRAVENOUS | Status: AC
  Administered 2014-05-18: 2 g via INTRAVENOUS

## 2014-05-18 MED ORDER — LIDOCAINE HCL (CARDIAC) 20 MG/ML IV SOLN
INTRAVENOUS | Status: AC
  Filled 2014-05-18: qty 5

## 2014-05-18 MED ORDER — FLEET ENEMA 7-19 GM/118ML RE ENEM: 1.0000 | ENEMA | Freq: Once | RECTAL | Status: DC

## 2014-05-18 MED ORDER — PHENYLEPHRINE HCL 10 MG/ML IJ SOLN
INTRAMUSCULAR | Status: DC | PRN
Start: 2014-05-18 — End: 2014-05-18

## 2014-05-18 MED ORDER — DIGOXIN 125 MCG PO TABS
0.1250 mg | ORAL_TABLET | Freq: Every day | ORAL | Status: DC
  Filled 2014-05-18 (×2): qty 1

## 2014-05-18 MED ORDER — ONDANSETRON HCL 4 MG/2ML IJ SOLN
INTRAMUSCULAR | Status: DC | PRN
  Administered 2014-05-18 (×2): 2 mg via INTRAVENOUS

## 2014-05-18 MED ORDER — ALBUTEROL SULFATE (2.5 MG/3ML) 0.083% IN NEBU
3.0000 mL | INHALATION_SOLUTION | RESPIRATORY_TRACT | Status: DC | PRN
  Administered 2014-05-18: 3 mL via RESPIRATORY_TRACT
  Filled 2014-05-18: qty 3

## 2014-05-18 MED ORDER — PHENYLEPHRINE HCL 10 MG/ML IJ SOLN
INTRAMUSCULAR | Status: AC
  Filled 2014-05-18: qty 1

## 2014-05-18 MED ORDER — EPHEDRINE SULFATE 50 MG/ML IJ SOLN
INTRAMUSCULAR | Status: DC | PRN
  Administered 2014-05-18: 10 mg via INTRAVENOUS
  Administered 2014-05-18: 5 mg via INTRAVENOUS

## 2014-05-18 MED ORDER — ETOMIDATE 2 MG/ML IV SOLN
INTRAVENOUS | Status: AC
  Filled 2014-05-18: qty 10

## 2014-05-18 MED ORDER — FENTANYL CITRATE 0.05 MG/ML IJ SOLN: 25.0000 ug | INTRAMUSCULAR | Status: DC | PRN

## 2014-05-18 MED ORDER — ONDANSETRON HCL 4 MG/2ML IJ SOLN: 4.0000 mg | Freq: Four times a day (QID) | INTRAMUSCULAR | Status: DC | PRN

## 2014-05-18 MED ORDER — NITROGLYCERIN 0.2 MG/HR TD PT24
0.2000 mg | MEDICATED_PATCH | Freq: Every day | TRANSDERMAL | Status: DC
Start: 2014-05-19 — End: 2014-05-19
  Filled 2014-05-18: qty 1

## 2014-05-18 MED ORDER — MIDAZOLAM HCL 5 MG/5ML IJ SOLN
INTRAMUSCULAR | Status: DC | PRN
  Administered 2014-05-18: .5 mg via INTRAVENOUS

## 2014-05-18 MED ORDER — FENTANYL CITRATE 0.05 MG/ML IJ SOLN
INTRAMUSCULAR | Status: AC
  Filled 2014-05-18: qty 5

## 2014-05-18 MED ORDER — PROPOFOL 10 MG/ML IV BOLUS
INTRAVENOUS | Status: DC | PRN
  Administered 2014-05-18: 60 mg via INTRAVENOUS

## 2014-05-18 SURGICAL SUPPLY — 18 items
ADH SKN CLS APL DERMABOND .7 (GAUZE/BANDAGES/DRESSINGS) ×1
DERMABOND ADVANCED (GAUZE/BANDAGES/DRESSINGS) ×1
DERMABOND ADVANCED .7 DNX12 (GAUZE/BANDAGES/DRESSINGS) IMPLANT
DRAPE POUCH INSTRU U-SHP 10X18 (DRAPES) ×2 IMPLANT
DRESSING ALLEVYN LIFE SACRUM (GAUZE/BANDAGES/DRESSINGS) ×1 IMPLANT
GLOVE BIOGEL PI IND STRL 8 (GLOVE) ×1 IMPLANT
GLOVE BIOGEL PI INDICATOR 8 (GLOVE) ×1
GLOVE ECLIPSE 8.0 STRL XLNG CF (GLOVE) ×2 IMPLANT
GOWN STRL REUS W/TWL LRG LVL3 (GOWN DISPOSABLE) ×4 IMPLANT
GOWN STRL REUS W/TWL XL LVL3 (GOWN DISPOSABLE) ×8 IMPLANT
KIT BASIN OR (CUSTOM PROCEDURE TRAY) ×2 IMPLANT
LUBRICANT JELLY K Y 4OZ (MISCELLANEOUS) ×2 IMPLANT
NEEDLE HYPO 22GX1.5 SAFETY (NEEDLE) ×1 IMPLANT
PACK GENERAL/GYN (CUSTOM PROCEDURE TRAY) ×2 IMPLANT
SUT PROLENE 4 0 PS 2 18 (SUTURE) ×2 IMPLANT
SUT VIC AB 3-0 SH 18 (SUTURE) ×1 IMPLANT
SYR CONTROL 10ML LL (SYRINGE) ×1 IMPLANT
WATER STERILE IRR 500ML POUR (IV SOLUTION) ×2 IMPLANT

## 2014-05-18 NOTE — Transfer of Care (Signed)
Immediate Anesthesia Transfer of Care Note  Patient: [name]  Procedure(s) Performed: Procedure(s): EXCISION AND CLOSURE OF DECUBITUS ULCER 5CM (N/A)  Patient Location: PACU  Anesthesia Type:General  Level of Consciousness: awake, alert , oriented and patient cooperative  Airway & Oxygen Therapy: Patient Spontanous Breathing and Patient connected to face mask oxygen  Post-op Assessment: Report given to PACU RN, Post -op Vital signs reviewed and stable and Patient moving all extremities  Post vital signs: Reviewed and stable  Complications: No apparent anesthesia complications

## 2014-05-18 NOTE — Op Note (Signed)
Surgeon: Kaylyn Lim, MD, FACS  Asst:  none  Anes:  general  Procedure: Excision and closure of midline decubitus ulcer  Diagnosis: Pressure decubitus in the midline with kissing ulcer on the left buttocks cheek  Complications: none  EBL:   minimal cc  Drains: none  Description of Procedure:  The patient was taken to OR 6 at [facility].  After anesthesia was administered and the patient was prepped a timeout was performed.  The patient was in the prone position.  The midline nonhealing decubitus ulcer was marked for an elliptical excision and this was performed excising all of the chronic granulation material.  The resultant wound was closed in layers with 3-0 vicryl and vertical mattress sutures of 4-0 Prolene.  Dermabond was applied.  The kissing ulcer was debrided and approximated with 4-0 prolene.  Patient was awakened and taken to the PACU in stable condition.    The patient tolerated the procedure well and was taken to the PACU in stable condition.     Matt B. Hassell Done, [place], [facility], [place]

## 2014-05-18 NOTE — Anesthesia Procedure Notes (Signed)
Procedure Name: Intubation Date/Time: 05/18/2014 7:54 AM Performed by: Ofilia Neas Pre-anesthesia Checklist: Patient identified, Emergency Drugs available, Suction available, Patient being monitored and Timeout performed Patient Re-evaluated:Patient Re-evaluated prior to inductionOxygen Delivery Method: Circle system utilized Preoxygenation: Pre-oxygenation with 100% oxygen Intubation Type: IV induction and Cricoid Pressure applied Laryngoscope Size: Mac and 4 Grade View: Grade II Tube type: Oral Tube size: 7.5 mm Number of attempts: 1 Airway Equipment and Method: Stylet Placement Confirmation: ETT inserted through vocal cords under direct vision and positive ETCO2 Secured at: 21 cm Tube secured with: Tape Dental Injury: Teeth and Oropharynx as per pre-operative assessment

## 2014-05-18 NOTE — Anesthesia Postprocedure Evaluation (Signed)
  Anesthesia Post-op Note  Patient: [name]  Procedure(s) Performed: Procedure(s) (LRB): EXCISION AND CLOSURE OF DECUBITUS ULCER 5CM (N/A)  Patient Location: PACU  Anesthesia Type: General  Level of Consciousness: awake and alert   Airway and Oxygen Therapy: Patient Spontanous Breathing  Post-op Pain: mild  Post-op Assessment: Post-op Vital signs reviewed, Patient's Cardiovascular Status Stable, Respiratory Function Stable, Patent Airway and No signs of Nausea or vomiting  Last Vitals:  Filed Vitals:   05/18/14 0930  BP: 110/40  Pulse: 74  Temp:   Resp: 12    Post-op Vital Signs: stable   Complications: No apparent anesthesia complications

## 2014-05-18 NOTE — Consult Note (Addendum)
WOC wound consult note Reason for Consult: Consult requested for sacrum pressure ulcer preventive measures.  Pt has been followed prior to admission by the outpatient wound care clinic.  He had surgery today by [facility] team and has a closed incision over his sacrum/gluteal fold area.  Incision line well approximated without open wound, small amt dried bloody drainage, no odor.  Dr Hassell Done plans to follow for further plan of care.  Requested to discuss pressure ulcer prevention measures with pt.  He is very well-informed regarding offloading pressure frequently to site and states he has an air mattress at home on his bed and a gel pad for his chair.  He currently has a foam dressing on the area for further protection. Please re-consult if further assistance is needed.  Thank-you,  Julien Girt MSN, [place], [place], [place], [place]

## 2014-05-18 NOTE — Interval H&P Note (Signed)
History and Physical Interval Note:  05/18/2014 7:39 AM  [name]  has presented today for surgery, with the diagnosis of sacral decubitus ulcer  The various methods of treatment have been discussed with the patient and family. After consideration of risks, benefits and other options for treatment, the patient has consented to  Procedure(s): EXCISION AND CLOSURE OF DECUBITUS ULCER 5CM (N/A) as a surgical intervention .  The patient's history has been reviewed, patient examined, no change in status, stable for surgery.  I have reviewed the patient's chart and labs.  Questions were answered to the patient's satisfaction.     Kayna Suppa B

## 2014-05-18 NOTE — H&P (View-Only) (Signed)
[name] 05/14/2014 9:23 AM Location: [facility] Patient #: [id number] DOB: [birth year] Married / Language: [name] / Race: White Male History of Present Illness Robert Key B. Hassell Done MD; 05/14/2014 9:57 AM) Patient words: sacral ulcer 78 year old man on whom I did a colectomy about 10 years ago (no info in [place]) who has had multiple medical issues: COPD from chronic smoking, peripheral vascular disease from smoking, lower back surgery with some problems with neuropathy creating the current problem. About a year ago during his vascular reconstruction he developed a pressure sore over his sacrum. This is an area that he can't feel. He also has trouble getting his bowels move and has to disimpact himself manually. I had spoken with [name] about him. He thinks that excision and primary closure is the way to go.  The patient is a 78 year old male   Other Problems [name], [name]; 05/14/2014 9:32 AM) Atrial Fibrillation Back Pain Bladder Problems Cerebrovascular Accident Chronic Obstructive Lung Disease Colon Cancer Congestive Heart Failure Heart murmur High blood pressure Home Oxygen Use Hypercholesterolemia Vascular Disease  Past Surgical History [name], [name]; 05/14/2014 9:32 AM) Bypass Surgery for Poor Blood Flow to Legs Colon Polyp Removal - Colonoscopy Colon Polyp Removal - Open Colon Removal - Partial Coronary Artery Bypass Graft Spinal Surgery - Lower Back Spinal Surgery Midback  Diagnostic Studies History [name], CMA; 05/14/2014 9:32 AM) Colonoscopy 1-5 years ago  Allergies ([name], [name]; 05/14/2014 9:28 AM) No Known Drug Allergies 05/14/2014  Medication History ([name], CMA; 05/14/2014 9:32 AM) Doxycycline Hyclate (100MG  Capsule, Oral) Active. Nitrostat (0.4MG  Tab Sublingual, Sublingual as needed) Active. Hydrocodone-Acetaminophen (5-300MG  Tablet, Oral as needed) Active. Albuterol Sulfate  (0.63MG /3ML Nebulized Soln, Inhalation as needed) Active. Aspirin (81MG  Tablet, Oral) Active. AmLODIPine Besylate (5MG  Tablet, Oral) Active. Pulmicort (0.5MG /2ML Suspension, Inhalation as needed) Active. Atacand (32MG  Tablet, Oral) Active. Carvedilol (25MG  Tablet, Oral) Active. Ferrous Sulfate (325 (65 Fe)MG Tablet, Oral) Active. [facility] Packet, Oral) Active. Lanoxin (125MCG Tablet, Oral) Active. Simvastatin (20MG  Tablet, Oral) Active. Spiriva HandiHaler (18MCG Capsule, Inhalation) Active. Santyl (250UNIT/GM Ointment, External) Active.  Social History [name], [name]; 05/14/2014 9:32 AM) Alcohol use Occasional alcohol use. Caffeine use Coffee, Tea. No drug use Tobacco use Former smoker.  Family History [name], CMA; 05/14/2014 9:32 AM) Heart Disease Brother, Father, Mother. Hypertension Brother, Father, Mother. Respiratory Condition Father.     Review of Systems ([name]; 05/14/2014 9:32 AM) General Present- Fatigue and Weight Loss. Not Present- Appetite Loss, Chills, Fever, Night Sweats and Weight Gain. Skin Present- Change in Wart/Mole, Dryness, Non-Healing Wounds and Ulcer. Not Present- Hives, Jaundice, New Lesions and Rash. HEENT Not Present- Earache, Hearing Loss, Hoarseness, Nose Bleed, Oral Ulcers, Ringing in the Ears, Seasonal Allergies, Sinus Pain, Sore Throat, Visual Disturbances, Wears glasses/contact lenses and Yellow Eyes. Respiratory Present- Difficulty Breathing. Not Present- Bloody sputum, Chronic Cough, Snoring and Wheezing. Cardiovascular Present- Shortness of Breath and Swelling of Extremities. Not Present- Chest Pain, Difficulty Breathing Lying Down, Leg Cramps, Palpitations and Rapid Heart Rate. Gastrointestinal Present- Change in Bowel Habits, Difficulty Swallowing and Indigestion. Not Present- Abdominal Pain, Bloating, Bloody Stool, Chronic diarrhea, Constipation, Excessive gas, Gets full quickly at meals, Hemorrhoids,  Nausea, Rectal Pain and Vomiting. Male Genitourinary Present- Impotence. Not Present- Blood in Urine, Change in Urinary Stream, Frequency, Nocturia, Painful Urination, Urgency and Urine Leakage.  Vitals ([name] CMA; 05/14/2014 9:33 AM) 05/14/2014 9:33 AM Weight: 151 lb Height: 67in Body Surface Area: 1.8 m Body Mass Index:  23.65 kg/m Temp.: 70F(Temporal)  Pulse: 89 (Regular)  BP: 138/80 (Sitting, Left Arm, Standard)     Physical Exam ([name] B. Hassell Done MD; 05/14/2014 10:04 AM)  The physical exam findings are as follows: Note:Elderly white male seated in no acute distress wearing home oxygen. HEENT: negative Neck: no bruits Chest: clear Heart: implantable defibrillator in place Abdomen old incision has healed without problems Back: sacral ulcer in the midline. it is about 3 inches long but only 1 cm wide. Neuro alert and oriented 3. Motor and sensory function affected by his back surgery.    Assessment & Plan Robert Key B. Hassell Done MD; 05/14/2014 10:06 AM)  SACRAL DECUBITUS ULCER, STAGE I (707.03  L89.151) Impression: plan excision and primary closure under general

## 2014-05-19 ENCOUNTER — Encounter (HOSPITAL_COMMUNITY): Payer: Self-pay | Admitting: Surgery

## 2014-05-19 LAB — CBC
HCT: 29.5 % — ABNORMAL LOW (ref 39.0–52.0)
Hemoglobin: 9.4 g/dL — ABNORMAL LOW (ref 13.0–17.0)
MCH: 30.1 pg (ref 26.0–34.0)
MCHC: 31.9 g/dL (ref 30.0–36.0)
MCV: 94.6 fL (ref 78.0–100.0)
Platelets: 170 10*3/uL (ref 150–400)
RBC: 3.12 MIL/uL — ABNORMAL LOW (ref 4.22–5.81)
RDW: 14.5 % (ref 11.5–15.5)
WBC: 6.6 10*3/uL (ref 4.0–10.5)

## 2014-05-19 LAB — BASIC METABOLIC PANEL
ANION GAP: 9 (ref 5–15)
BUN: 41 mg/dL — ABNORMAL HIGH (ref 6–23)
CALCIUM: 9.3 mg/dL (ref 8.4–10.5)
CO2: 28 mEq/L (ref 19–32)
CREATININE: 1.52 mg/dL — AB (ref 0.50–1.35)
Chloride: 97 mEq/L (ref 96–112)
GFR, EST AFRICAN AMERICAN: 50 mL/min — AB (ref >90)
GFR, EST NON AFRICAN AMERICAN: 43 mL/min — AB (ref >90)
Glucose, Bld: 142 mg/dL — ABNORMAL HIGH (ref 70–99)
Potassium: 4.7 mEq/L (ref 3.7–5.3)
Sodium: 134 mEq/L — ABNORMAL LOW (ref 137–147)

## 2014-05-19 MED ORDER — ALBUTEROL SULFATE (2.5 MG/3ML) 0.083% IN NEBU
2.5000 mg | INHALATION_SOLUTION | Freq: Four times a day (QID) | RESPIRATORY_TRACT | Status: DC
  Administered 2014-05-19: 2.5 mg via RESPIRATORY_TRACT
  Filled 2014-05-19: qty 3

## 2014-05-19 NOTE — Plan of Care (Signed)
Problem: Phase I Progression Outcomes Goal: Pain controlled with appropriate interventions Outcome: Progressing  Comments:  Patient c/o pain 5/10. Pt received pain medicine. 1 hour after medicine pain level 2/10.

## 2014-05-19 NOTE — Discharge Summary (Signed)
Physician Discharge Summary  Patient ID: [name] MRN: [id number] DOB/AGE: 1936-02-18 78 y.o.  Admit date: 05/18/2014 Discharge date: 05/19/2014  Admission Diagnoses:  Nonhealing midline decubitus ulcer of the sacral region  Discharge Diagnoses:  same  Active Problems:   Decubitus ulcer of coccygeal region-excision and closure Nov 2015   Surgery:  Excision and primary closure of ulcers  Discharged Condition: improved  Hospital Course:   Had surgery.  Kept on his side.  Motivated to go home on the first postop day.   Consults: none  Significant Diagnostic Studies: none    Discharge Exam: Blood pressure 129/51, pulse 78, temperature 98.1 F (36.7 C), temperature source Oral, resp. rate 16, height 5\' 7"  (1.702 m), weight 147 lb (66.679 kg), SpO2 95 %. Incisions closed with prolene sutures and Dermabond  Disposition: 01-Home or Self Care  Discharge Instructions    Diet - low sodium heart healthy    Complete by:  As directed      Discharge instructions    Complete by:  As directed   Rotate from side to side and avoid prolonged pressure on the posterior buttocks area     Increase activity slowly    Complete by:  As directed      No wound care    Complete by:  As directed   You have sutures in your backside that will need to be removed in two weeks.            Medication List    TAKE these medications        acetaminophen 325 MG tablet  Commonly known as:  TYLENOL  Take 2 tablets (650 mg total) by mouth every 4 (four) hours as needed for headache or mild pain.     albuterol (2.5 MG/3ML) 0.083% nebulizer solution  Commonly known as:  PROVENTIL  Take 2.5 mg by nebulization 4 (four) times daily.     albuterol 108 (90 BASE) MCG/ACT inhaler  Commonly known as:  PROVENTIL HFA;VENTOLIN HFA  Inhale 2 puffs into the lungs every 6 (six) hours as needed for wheezing or shortness of breath.     PROVENTIL HFA 108 (90 BASE) MCG/ACT inhaler  Generic drug:  albuterol   INHALE 1 TO 2 PUFFS INTO THE LUNGS EVERY 6 HOURS AS NEEDED FOR SHORTNESS OF BREATH     amLODipine 5 MG tablet  Commonly known as:  NORVASC  TAKE 1 TABLET DAILY     amLODipine 5 MG tablet  Commonly known as:  NORVASC  Take 5 mg by mouth every morning.     aspirin EC 325 MG tablet  Take 325 mg by mouth at bedtime.     budesonide 0.5 MG/2ML nebulizer solution  Commonly known as:  PULMICORT  Take 0.5 mg by nebulization 2 (two) times daily.     candesartan 32 MG tablet  Commonly known as:  ATACAND  Take 32 mg by mouth every morning.     carvedilol 25 MG tablet  Commonly known as:  COREG  Take 25 mg by mouth 2 (two) times daily with a meal.     collagenase ointment  Commonly known as:  SANTYL  Apply 1 application topically every other day.     doxycycline 100 MG DR capsule  Commonly known as:  DORYX  Take 100 mg by mouth 2 (two) times daily.     ferrous sulfate 325 (65 FE) MG tablet  Take 325 mg by mouth at bedtime.     furosemide 40  MG tablet  Commonly known as:  LASIX  Take 40-80 mg by mouth 2 (two) times daily. 2 tablets in the morning and 1 tablet in the evening     furosemide 40 MG tablet  Commonly known as:  LASIX  TAKE 2 TABLETS IN THE MORNING AND 1 TABLET IN THE EVENING     guaiFENesin 600 MG 12 hr tablet  Commonly known as:  MUCINEX  Take 600 mg by mouth 2 (two) times daily as needed. congestion     HYDROcodone-acetaminophen 2.5-500 MG per tablet  Commonly known as:  VICODIN  Take 1 tablet by mouth every 6 (six) hours as needed for pain.     potassium chloride SA 20 MEQ tablet  Commonly known as:  K-DUR,KLOR-CON  Take 20 mEq by mouth at bedtime.     KLOR-CON M20 20 MEQ tablet  Generic drug:  potassium chloride SA  TAKE 1 TABLET DAILY     digoxin 0.125 MG tablet  Commonly known as:  LANOXIN  Take 0.125 mg by mouth every morning.     LANOXIN 0.125 MG tablet  Generic drug:  digoxin  TAKE 1 TABLET DAILY     mupirocin ointment 2 %  Commonly known  as:  BACTROBAN  Apply 1 application topically 2 (two) times daily as needed (Cuts and bruises).     naproxen sodium 220 MG tablet  Commonly known as:  ANAPROX  Take 220 mg by mouth 2 (two) times daily as needed (Pain).     nitroGLYCERIN 0.4 MG SL tablet  Commonly known as:  NITROSTAT  Place 0.4 mg under the tongue every 5 (five) minutes as needed for chest pain.     nitroGLYCERIN 0.2 mg/hr patch  Commonly known as:  NITRODUR - Dosed in mg/24 hr  Place 1 patch onto the skin daily.     predniSONE 10 MG tablet  Commonly known as:  DELTASONE  Take 10 mg by mouth daily.     PRESCRIPTION MEDICATION  Uses 1L-2L of home Oxygen     simvastatin 20 MG tablet  Commonly known as:  ZOCOR  Take 20 mg by mouth at bedtime.     sulfamethoxazole-trimethoprim 800-160 MG per tablet  Commonly known as:  BACTRIM DS,SEPTRA DS  Take 1 tablet by mouth 2 (two) times daily. 7 day course started 05-12-14     tiotropium 18 MCG inhalation capsule  Commonly known as:  SPIRIVA  Place 18 mcg into inhaler and inhale daily.           Follow-up Information    Follow up with Pedro Earls, MD. Schedule an appointment as soon as possible for a visit on 06/01/2014.   Specialty:  General Surgery   Why:  For suture removal   Contact information:   [address] [place] [address] [phone]       Signed: Pedro Earls 05/19/2014, 9:10 AM

## 2014-05-19 NOTE — Discharge Instructions (Signed)
Stay on your sides when lying down to avoid pressure on your back side

## 2014-05-27 ENCOUNTER — Inpatient Hospital Stay (HOSPITAL_COMMUNITY)
Admission: EM | Admit: 2014-05-27 | Discharge: 2014-06-02 | DRG: 919 | Disposition: A | Discharge status: Skilled Nursing Facility | Payer: Medicare Other | Attending: Internal Medicine | Admitting: Internal Medicine

## 2014-05-27 ENCOUNTER — Encounter (HOSPITAL_COMMUNITY): Payer: Self-pay | Admitting: Physical Medicine and Rehabilitation

## 2014-05-27 ENCOUNTER — Other Ambulatory Visit: Payer: Self-pay

## 2014-05-27 ENCOUNTER — Emergency Department (HOSPITAL_COMMUNITY): Payer: Medicare Other

## 2014-05-27 ENCOUNTER — Encounter: Payer: Self-pay | Admitting: Cardiology

## 2014-05-27 DIAGNOSIS — I2510 Atherosclerotic heart disease of native coronary artery without angina pectoris: Secondary | ICD-10-CM | POA: Diagnosis present

## 2014-05-27 DIAGNOSIS — Z7982 Long term (current) use of aspirin: Secondary | ICD-10-CM | POA: Diagnosis not present

## 2014-05-27 DIAGNOSIS — Z8673 Personal history of transient ischemic attack (TIA), and cerebral infarction without residual deficits: Secondary | ICD-10-CM

## 2014-05-27 DIAGNOSIS — I4819 Other persistent atrial fibrillation: Secondary | ICD-10-CM | POA: Diagnosis present

## 2014-05-27 DIAGNOSIS — J9610 Chronic respiratory failure, unspecified whether with hypoxia or hypercapnia: Secondary | ICD-10-CM | POA: Diagnosis present

## 2014-05-27 DIAGNOSIS — E43 Unspecified severe protein-calorie malnutrition: Secondary | ICD-10-CM | POA: Diagnosis present

## 2014-05-27 DIAGNOSIS — Z9981 Dependence on supplemental oxygen: Secondary | ICD-10-CM | POA: Diagnosis not present

## 2014-05-27 DIAGNOSIS — R011 Cardiac murmur, unspecified: Secondary | ICD-10-CM | POA: Diagnosis present

## 2014-05-27 DIAGNOSIS — Z833 Family history of diabetes mellitus: Secondary | ICD-10-CM | POA: Diagnosis not present

## 2014-05-27 DIAGNOSIS — D649 Anemia, unspecified: Secondary | ICD-10-CM | POA: Diagnosis present

## 2014-05-27 DIAGNOSIS — N289 Disorder of kidney and ureter, unspecified: Secondary | ICD-10-CM

## 2014-05-27 DIAGNOSIS — J9620 Acute and chronic respiratory failure, unspecified whether with hypoxia or hypercapnia: Secondary | ICD-10-CM

## 2014-05-27 DIAGNOSIS — K592 Neurogenic bowel, not elsewhere classified: Secondary | ICD-10-CM | POA: Diagnosis present

## 2014-05-27 DIAGNOSIS — E875 Hyperkalemia: Secondary | ICD-10-CM | POA: Diagnosis present

## 2014-05-27 DIAGNOSIS — I5023 Acute on chronic systolic (congestive) heart failure: Secondary | ICD-10-CM | POA: Diagnosis present

## 2014-05-27 DIAGNOSIS — L89152 Pressure ulcer of sacral region, stage 2: Secondary | ICD-10-CM | POA: Diagnosis present

## 2014-05-27 DIAGNOSIS — T380X5A Adverse effect of glucocorticoids and synthetic analogues, initial encounter: Secondary | ICD-10-CM | POA: Diagnosis not present

## 2014-05-27 DIAGNOSIS — I10 Essential (primary) hypertension: Secondary | ICD-10-CM | POA: Diagnosis present

## 2014-05-27 DIAGNOSIS — L0390 Cellulitis, unspecified: Secondary | ICD-10-CM | POA: Diagnosis present

## 2014-05-27 DIAGNOSIS — Z79899 Other long term (current) drug therapy: Secondary | ICD-10-CM | POA: Diagnosis not present

## 2014-05-27 DIAGNOSIS — I739 Peripheral vascular disease, unspecified: Secondary | ICD-10-CM | POA: Diagnosis present

## 2014-05-27 DIAGNOSIS — I481 Persistent atrial fibrillation: Secondary | ICD-10-CM

## 2014-05-27 DIAGNOSIS — Z6821 Body mass index (BMI) 21.0-21.9, adult: Secondary | ICD-10-CM

## 2014-05-27 DIAGNOSIS — E785 Hyperlipidemia, unspecified: Secondary | ICD-10-CM | POA: Diagnosis present

## 2014-05-27 DIAGNOSIS — T148XXA Other injury of unspecified body region, initial encounter: Secondary | ICD-10-CM

## 2014-05-27 DIAGNOSIS — Z95810 Presence of automatic (implantable) cardiac defibrillator: Secondary | ICD-10-CM | POA: Diagnosis not present

## 2014-05-27 DIAGNOSIS — T8130XA Disruption of wound, unspecified, initial encounter: Secondary | ICD-10-CM | POA: Diagnosis not present

## 2014-05-27 DIAGNOSIS — E1165 Type 2 diabetes mellitus with hyperglycemia: Secondary | ICD-10-CM | POA: Diagnosis present

## 2014-05-27 DIAGNOSIS — I472 Ventricular tachycardia: Secondary | ICD-10-CM | POA: Diagnosis present

## 2014-05-27 DIAGNOSIS — N390 Urinary tract infection, site not specified: Secondary | ICD-10-CM

## 2014-05-27 DIAGNOSIS — N319 Neuromuscular dysfunction of bladder, unspecified: Secondary | ICD-10-CM | POA: Diagnosis present

## 2014-05-27 DIAGNOSIS — Z7952 Long term (current) use of systemic steroids: Secondary | ICD-10-CM

## 2014-05-27 DIAGNOSIS — I5022 Chronic systolic (congestive) heart failure: Secondary | ICD-10-CM | POA: Diagnosis present

## 2014-05-27 DIAGNOSIS — Z951 Presence of aortocoronary bypass graft: Secondary | ICD-10-CM | POA: Diagnosis not present

## 2014-05-27 DIAGNOSIS — Z9889 Other specified postprocedural states: Secondary | ICD-10-CM

## 2014-05-27 DIAGNOSIS — R0600 Dyspnea, unspecified: Secondary | ICD-10-CM

## 2014-05-27 DIAGNOSIS — J9611 Chronic respiratory failure with hypoxia: Secondary | ICD-10-CM

## 2014-05-27 DIAGNOSIS — Z8249 Family history of ischemic heart disease and other diseases of the circulatory system: Secondary | ICD-10-CM

## 2014-05-27 DIAGNOSIS — Z85038 Personal history of other malignant neoplasm of large intestine: Secondary | ICD-10-CM | POA: Diagnosis not present

## 2014-05-27 DIAGNOSIS — J441 Chronic obstructive pulmonary disease with (acute) exacerbation: Secondary | ICD-10-CM

## 2014-05-27 DIAGNOSIS — R531 Weakness: Secondary | ICD-10-CM

## 2014-05-27 DIAGNOSIS — I255 Ischemic cardiomyopathy: Secondary | ICD-10-CM | POA: Diagnosis present

## 2014-05-27 DIAGNOSIS — Z87891 Personal history of nicotine dependence: Secondary | ICD-10-CM | POA: Diagnosis not present

## 2014-05-27 DIAGNOSIS — L089 Local infection of the skin and subcutaneous tissue, unspecified: Secondary | ICD-10-CM | POA: Diagnosis present

## 2014-05-27 DIAGNOSIS — T798XXS Other early complications of trauma, sequela: Secondary | ICD-10-CM

## 2014-05-27 HISTORY — DX: Pneumonia, unspecified organism: J18.9

## 2014-05-27 HISTORY — DX: Transient cerebral ischemic attack, unspecified: G45.9

## 2014-05-27 HISTORY — DX: Prediabetes: R73.03

## 2014-05-27 LAB — COMPREHENSIVE METABOLIC PANEL
ALT: 27 U/L (ref 0–53)
ANION GAP: 14 (ref 5–15)
AST: 32 U/L (ref 0–37)
Albumin: 2.9 g/dL — ABNORMAL LOW (ref 3.5–5.2)
Alkaline Phosphatase: 66 U/L (ref 39–117)
BILIRUBIN TOTAL: 0.3 mg/dL (ref 0.3–1.2)
BUN: 57 mg/dL — ABNORMAL HIGH (ref 6–23)
CHLORIDE: 95 meq/L — AB (ref 96–112)
CO2: 27 mEq/L (ref 19–32)
Calcium: 9.2 mg/dL (ref 8.4–10.5)
Creatinine, Ser: 1.1 mg/dL (ref 0.50–1.35)
GFR calc non Af Amer: 63 mL/min — ABNORMAL LOW (ref >90)
GFR, EST AFRICAN AMERICAN: 73 mL/min — AB (ref >90)
Glucose, Bld: 149 mg/dL — ABNORMAL HIGH (ref 70–99)
POTASSIUM: 5.9 meq/L — AB (ref 3.7–5.3)
SODIUM: 136 meq/L — AB (ref 137–147)
Total Protein: 7.3 g/dL (ref 6.0–8.3)

## 2014-05-27 LAB — PRO B NATRIURETIC PEPTIDE: Pro B Natriuretic peptide (BNP): 1578 pg/mL — ABNORMAL HIGH (ref 0–450)

## 2014-05-27 LAB — CBC WITH DIFFERENTIAL/PLATELET
Basophils Absolute: 0 10*3/uL (ref 0.0–0.1)
Basophils Relative: 0 % (ref 0–1)
Eosinophils Absolute: 0 10*3/uL (ref 0.0–0.7)
Eosinophils Relative: 0 % (ref 0–5)
HCT: 29.2 % — ABNORMAL LOW (ref 39.0–52.0)
HEMOGLOBIN: 9.3 g/dL — AB (ref 13.0–17.0)
LYMPHS ABS: 0.8 10*3/uL (ref 0.7–4.0)
LYMPHS PCT: 14 % (ref 12–46)
MCH: 29.9 pg (ref 26.0–34.0)
MCHC: 31.8 g/dL (ref 30.0–36.0)
MCV: 93.9 fL (ref 78.0–100.0)
MONOS PCT: 16 % — AB (ref 3–12)
Monocytes Absolute: 0.9 10*3/uL (ref 0.1–1.0)
NEUTROS ABS: 3.7 10*3/uL (ref 1.7–7.7)
Neutrophils Relative %: 70 % (ref 43–77)
PLATELETS: 218 10*3/uL (ref 150–400)
RBC: 3.11 MIL/uL — AB (ref 4.22–5.81)
RDW: 14.8 % (ref 11.5–15.5)
WBC: 5.4 10*3/uL (ref 4.0–10.5)

## 2014-05-27 LAB — URINALYSIS, ROUTINE W REFLEX MICROSCOPIC
BILIRUBIN URINE: NEGATIVE
GLUCOSE, UA: NEGATIVE mg/dL
Hgb urine dipstick: NEGATIVE
Ketones, ur: NEGATIVE mg/dL
Nitrite: NEGATIVE
Protein, ur: NEGATIVE mg/dL
Specific Gravity, Urine: 1.016 (ref 1.005–1.030)
UROBILINOGEN UA: 0.2 mg/dL (ref 0.0–1.0)
pH: 5.5 (ref 5.0–8.0)

## 2014-05-27 LAB — URINE MICROSCOPIC-ADD ON

## 2014-05-27 LAB — POTASSIUM: Potassium: 4.8 mEq/L (ref 3.7–5.3)

## 2014-05-27 LAB — I-STAT TROPONIN, ED: Troponin i, poc: 0.03 ng/mL (ref 0.00–0.08)

## 2014-05-27 LAB — I-STAT CG4 LACTIC ACID, ED
LACTIC ACID, VENOUS: 0.52 mmol/L (ref 0.5–2.2)
Lactic Acid, Venous: 1.29 mmol/L (ref 0.5–2.2)

## 2014-05-27 MED ORDER — NITROGLYCERIN 0.2 MG/HR TD PT24
0.2000 mg | MEDICATED_PATCH | Freq: Every day | TRANSDERMAL | Status: DC
  Administered 2014-05-27 – 2014-06-02 (×7): 0.2 mg via TRANSDERMAL
  Filled 2014-05-27 (×7): qty 1

## 2014-05-27 MED ORDER — AMLODIPINE BESYLATE 5 MG PO TABS
5.0000 mg | ORAL_TABLET | Freq: Every day | ORAL | Status: DC
  Filled 2014-05-27 (×2): qty 1

## 2014-05-27 MED ORDER — HEPARIN SODIUM (PORCINE) 5000 UNIT/ML IJ SOLN
5000.0000 [IU] | Freq: Three times a day (TID) | INTRAMUSCULAR | Status: DC
  Administered 2014-05-27 – 2014-06-02 (×18): 5000 [IU] via SUBCUTANEOUS
  Filled 2014-05-27 (×18): qty 1

## 2014-05-27 MED ORDER — ASPIRIN EC 325 MG PO TBEC
325.0000 mg | DELAYED_RELEASE_TABLET | Freq: Every day | ORAL | Status: DC
  Administered 2014-05-27 – 2014-06-01 (×6): 325 mg via ORAL
  Filled 2014-05-27 (×8): qty 1

## 2014-05-27 MED ORDER — VANCOMYCIN HCL 10 G IV SOLR
1250.0000 mg | INTRAVENOUS | Status: DC
  Administered 2014-05-28: 1250 mg via INTRAVENOUS
  Filled 2014-05-27 (×2): qty 1250

## 2014-05-27 MED ORDER — GUAIFENESIN ER 600 MG PO TB12
600.0000 mg | ORAL_TABLET | Freq: Two times a day (BID) | ORAL | Status: DC | PRN
  Administered 2014-05-28 – 2014-06-01 (×8): 600 mg via ORAL
  Filled 2014-05-27 (×10): qty 1

## 2014-05-27 MED ORDER — SODIUM POLYSTYRENE SULFONATE 15 GM/60ML PO SUSP
30.0000 g | Freq: Once | ORAL | Status: AC
  Administered 2014-05-27: 30 g via ORAL
  Filled 2014-05-27: qty 120

## 2014-05-27 MED ORDER — FUROSEMIDE 40 MG PO TABS
40.0000 mg | ORAL_TABLET | Freq: Two times a day (BID) | ORAL | Status: DC
  Administered 2014-05-28 – 2014-06-01 (×9): 40 mg via ORAL
  Filled 2014-05-27 (×11): qty 1

## 2014-05-27 MED ORDER — PREDNISONE 10 MG PO TABS
10.0000 mg | ORAL_TABLET | Freq: Every day | ORAL | Status: DC
  Administered 2014-05-27 – 2014-05-30 (×4): 10 mg via ORAL
  Filled 2014-05-27 (×4): qty 1

## 2014-05-27 MED ORDER — SIMVASTATIN 20 MG PO TABS
20.0000 mg | ORAL_TABLET | Freq: Every day | ORAL | Status: DC
  Administered 2014-05-27 – 2014-06-01 (×6): 20 mg via ORAL
  Filled 2014-05-27 (×7): qty 1

## 2014-05-27 MED ORDER — PIPERACILLIN-TAZOBACTAM 3.375 G IVPB
3.3750 g | Freq: Three times a day (TID) | INTRAVENOUS | Status: DC
  Administered 2014-05-28: 3.375 g via INTRAVENOUS
  Filled 2014-05-27 (×4): qty 50

## 2014-05-27 MED ORDER — GUAIFENESIN-DM 100-10 MG/5ML PO SYRP
5.0000 mL | ORAL_SOLUTION | ORAL | Status: DC | PRN
  Filled 2014-05-27: qty 5

## 2014-05-27 MED ORDER — NITROGLYCERIN 0.4 MG SL SUBL: 0.4000 mg | SUBLINGUAL_TABLET | SUBLINGUAL | Status: DC | PRN

## 2014-05-27 MED ORDER — ACETAMINOPHEN 325 MG PO TABS
650.0000 mg | ORAL_TABLET | Freq: Four times a day (QID) | ORAL | Status: DC | PRN
  Administered 2014-05-28: 650 mg via ORAL
  Filled 2014-05-27: qty 2

## 2014-05-27 MED ORDER — DIGOXIN 125 MCG PO TABS
0.1250 mg | ORAL_TABLET | Freq: Every morning | ORAL | Status: DC
  Administered 2014-05-28 – 2014-06-02 (×6): 0.125 mg via ORAL
  Filled 2014-05-27 (×6): qty 1

## 2014-05-27 MED ORDER — LOPERAMIDE HCL 2 MG PO CAPS
2.0000 mg | ORAL_CAPSULE | Freq: Three times a day (TID) | ORAL | Status: DC | PRN
  Filled 2014-05-27: qty 1

## 2014-05-27 MED ORDER — ONDANSETRON HCL 4 MG PO TABS: 4.0000 mg | ORAL_TABLET | Freq: Four times a day (QID) | ORAL | Status: DC | PRN

## 2014-05-27 MED ORDER — CARVEDILOL 25 MG PO TABS
25.0000 mg | ORAL_TABLET | Freq: Two times a day (BID) | ORAL | Status: DC
  Administered 2014-05-28 – 2014-06-02 (×12): 25 mg via ORAL
  Filled 2014-05-27 (×13): qty 1

## 2014-05-27 MED ORDER — FERROUS SULFATE 325 (65 FE) MG PO TABS
325.0000 mg | ORAL_TABLET | Freq: Every day | ORAL | Status: DC
  Administered 2014-05-27 – 2014-06-01 (×6): 325 mg via ORAL
  Filled 2014-05-27 (×7): qty 1

## 2014-05-27 MED ORDER — CETYLPYRIDINIUM CHLORIDE 0.05 % MT LIQD
7.0000 mL | Freq: Two times a day (BID) | OROMUCOSAL | Status: DC
  Administered 2014-05-27 – 2014-06-02 (×12): 7 mL via OROMUCOSAL

## 2014-05-27 MED ORDER — TIOTROPIUM BROMIDE MONOHYDRATE 18 MCG IN CAPS
18.0000 ug | ORAL_CAPSULE | Freq: Every day | RESPIRATORY_TRACT | Status: DC
  Administered 2014-05-28 – 2014-06-02 (×6): 18 ug via RESPIRATORY_TRACT
  Filled 2014-05-27 (×2): qty 5

## 2014-05-27 MED ORDER — ACETAMINOPHEN 650 MG RE SUPP: 650.0000 mg | Freq: Four times a day (QID) | RECTAL | Status: DC | PRN

## 2014-05-27 MED ORDER — ALBUTEROL SULFATE (2.5 MG/3ML) 0.083% IN NEBU
2.5000 mg | INHALATION_SOLUTION | RESPIRATORY_TRACT | Status: DC | PRN
  Administered 2014-05-27 – 2014-06-02 (×6): 2.5 mg via RESPIRATORY_TRACT
  Filled 2014-05-27 (×7): qty 3

## 2014-05-27 MED ORDER — ONDANSETRON HCL 4 MG/2ML IJ SOLN: 4.0000 mg | Freq: Four times a day (QID) | INTRAMUSCULAR | Status: DC | PRN

## 2014-05-27 MED ORDER — BUDESONIDE 0.5 MG/2ML IN SUSP
0.5000 mg | Freq: Two times a day (BID) | RESPIRATORY_TRACT | Status: DC
  Administered 2014-05-27 – 2014-06-02 (×13): 0.5 mg via RESPIRATORY_TRACT
  Filled 2014-05-27 (×17): qty 2

## 2014-05-27 MED ORDER — ALBUTEROL SULFATE (2.5 MG/3ML) 0.083% IN NEBU
2.5000 mg | INHALATION_SOLUTION | Freq: Four times a day (QID) | RESPIRATORY_TRACT | Status: DC
Start: 2014-05-27 — End: 2014-06-03
  Administered 2014-05-27 – 2014-06-02 (×23): 2.5 mg via RESPIRATORY_TRACT
  Filled 2014-05-27 (×23): qty 3

## 2014-05-27 NOTE — Progress Notes (Signed)
ANTIBIOTIC CONSULT NOTE - INITIAL  Pharmacy Consult for vancomycin and zosyn Indication: wound infection  No Known Allergies  Patient Measurements:    Body Weight: 66.7 kg  Vital Signs: Temp: 97.7 F (36.5 C) (11/25 1406) Temp Source: Oral (11/25 1406) BP: 116/42 mmHg (11/25 1630) Pulse Rate: 81 (11/25 1630) Intake/Output from previous day:   Intake/Output from this shift:    Labs:  Recent Labs  05/27/14 1430 05/27/14 1517  WBC  --  5.4  HGB  --  9.3*  PLT  --  218  CREATININE 1.10  --    Estimated Creatinine Clearance: 53.4 mL/min (by C-G formula based on Cr of 1.1). No results for input(s): VANCOTROUGH, VANCOPEAK, VANCORANDOM, GENTTROUGH, GENTPEAK, GENTRANDOM, TOBRATROUGH, TOBRAPEAK, TOBRARND, AMIKACINPEAK, AMIKACINTROU, AMIKACIN in the last 72 hours.   Microbiology: No results found for this or any previous visit (from the past 720 hour(s)).  Medical History: Past Medical History  Diagnosis Date  . Hypokalemia   . Colon cancer   . PVD (peripheral vascular disease)   . HTN (hypertension)   . Automatic implantable cardiac defibrillator in situ   . CHF (congestive heart failure)   . Ventricular tachycardia   . Ischemic heart disease, chronic   . Dyslipidemia   . CAD (coronary artery disease)   . On home oxygen therapy   . Pacemaker   . Self-catheterizes urinary bladder     since back surgery.  6- 7 times a day.  . Difficult intubation     pt states "not aware of difficult intubation, never told"  . Heart murmur     dx 1974  . Stroke     TIA- 1980's  . Shortness of breath   . Diabetes mellitus without complication     boderline  . Head injury, closed, with brief LOC 2013  . Neurogenic bladder     since back surgery - self caths  . Neurogenic bowel     digitally stimulates daily.  Marland Kitchen COPD (chronic obstructive pulmonary disease)      PT USES OXYGEN 24 HRS A DAY - 2 L NASAL CANNULA  . Emphysema   . Anemia     SERUM MONOCLONAL IgM KAPPA PROTEIN - NO  CLINICAL EVIDENCE FOR PROGRESSION TO MULTIPLE MYELOMA OR OTHER PROLIFERATIVE DISORDER - PER OFFICE NOTE DR. SHERRILL DATED 12/30/13    Medications:  Prescriptions prior to admission  Medication Sig Dispense Refill Last Dose  . albuterol (PROVENTIL HFA;VENTOLIN HFA) 108 (90 BASE) MCG/ACT inhaler Inhale 2 puffs into the lungs every 6 (six) hours as needed for wheezing or shortness of breath.    Past Month at Unknown time  . albuterol (PROVENTIL) (2.5 MG/3ML) 0.083% nebulizer solution Take 2.5 mg by nebulization 4 (four) times daily.    05/27/2014 at Unknown time  . amLODipine (NORVASC) 5 MG tablet TAKE 1 TABLET DAILY 90 tablet 1 05/27/2014 at Unknown time  . aspirin EC 325 MG tablet Take 325 mg by mouth at bedtime.   05/26/2014 at Unknown time  . budesonide (PULMICORT) 0.5 MG/2ML nebulizer solution Take 0.5 mg by nebulization 2 (two) times daily.    05/27/2014 at Unknown time  . candesartan (ATACAND) 32 MG tablet Take 32 mg by mouth every morning.    05/27/2014 at Unknown time  . carvedilol (COREG) 25 MG tablet Take 25 mg by mouth 2 (two) times daily with a meal.   05/27/2014 at Unknown time  . digoxin (LANOXIN) 0.125 MG tablet Take 0.125 mg by mouth every morning.  05/27/2014 at Unknown time  . doxycycline (DORYX) 100 MG DR capsule Take 100 mg by mouth 2 (two) times daily.   05/27/2014 at Unknown time  . ferrous sulfate 325 (65 FE) MG tablet Take 325 mg by mouth at bedtime.    05/26/2014 at Unknown time  . furosemide (LASIX) 40 MG tablet TAKE 2 TABLETS IN THE MORNING AND 1 TABLET IN THE EVENING 270 tablet 1 05/27/2014 at Unknown time  . guaiFENesin (MUCINEX) 600 MG 12 hr tablet Take 600 mg by mouth 2 (two) times daily as needed. congestion   05/27/2014 at Unknown time  . HYDROcodone-acetaminophen (VICODIN) 2.5-500 MG per tablet Take 1 tablet by mouth every 6 (six) hours as needed for pain.   05/27/2014 at Unknown time  . KLOR-CON M20 20 MEQ tablet TAKE 1 TABLET DAILY 90 tablet 1 05/27/2014 at  Unknown time  . LANOXIN 125 MCG tablet TAKE 1 TABLET DAILY 90 tablet 0 05/27/2014 at Unknown time  . mupirocin ointment (BACTROBAN) 2 % Apply 1 application topically 2 (two) times daily as needed (Cuts and bruises).   05/27/2014 at Unknown time  . naproxen sodium (ANAPROX) 220 MG tablet Take 220 mg by mouth 2 (two) times daily as needed (Pain).   Past Week at Unknown time  . nitroGLYCERIN (NITRODUR - DOSED IN MG/24 HR) 0.2 mg/hr patch Place 1 patch onto the skin daily.   05/27/2014 at Unknown time  . nitroGLYCERIN (NITROSTAT) 0.4 MG SL tablet Place 0.4 mg under the tongue every 5 (five) minutes as needed for chest pain.   unk  . predniSONE (DELTASONE) 10 MG tablet Take 10 mg by mouth daily.   05/27/2014 at Unknown time  . PROVENTIL HFA 108 (90 BASE) MCG/ACT inhaler INHALE 1 TO 2 PUFFS INTO THE LUNGS EVERY 6 HOURS AS NEEDED FOR SHORTNESS OF BREATH 3 each 1 Past Month at Unknown time  . simvastatin (ZOCOR) 20 MG tablet Take 20 mg by mouth at bedtime.    05/26/2014 at Unknown time  . tiotropium (SPIRIVA) 18 MCG inhalation capsule Place 18 mcg into inhaler and inhale daily.    05/27/2014 at Unknown time  . acetaminophen (TYLENOL) 325 MG tablet Take 2 tablets (650 mg total) by mouth every 4 (four) hours as needed for headache or mild pain. (Patient not taking: Reported on 05/27/2014)   Not Taking at Unknown time  . PRESCRIPTION MEDICATION Uses 1L-2L of home Oxygen   Taking  . sulfamethoxazole-trimethoprim (BACTRIM DS,SEPTRA DS) 800-160 MG per tablet Take 1 tablet by mouth 2 (two) times daily. 7 day course started 05-12-14   completed   Assessment: 78 yo man recently discharged after I&D decubitus ulcer.  He presents with weakness and will start broad spectrum antibiotics for wound infection.  T 97.7, WBC wnl  Goal of Therapy:  Vancomycin trough level 10-15 mcg/ml  Plan:  Zosyn 3.375 gm IV q8 hours Vancomycin $RemoveBeforeDE'1250mg'[id number]$  IV q24 hours F/u renal function, cultures and clinical course.  Thanks for  allowing pharmacy to be a part of this patient's care.  Excell Seltzer, PharmD Clinical Pharmacist, [phone] 05/27/2014,6:38 PM

## 2014-05-27 NOTE — ED Notes (Signed)
Spoke with Dr. Sloan Leiter regarding pt potassium, pt can go up to 6E and have a redraw of lab done there.

## 2014-05-27 NOTE — ED Notes (Signed)
Pt states he was recently diagnosed with UTI, c/o feeling very weak, fatigued and run down. Denies pain at present. Wears 2L O2 at home. No signs of distress noted. Pt is alert and oriented x4.

## 2014-05-27 NOTE — ED Notes (Signed)
CBC clotted, to be re-collected.

## 2014-05-27 NOTE — H&P (Signed)
PATIENT DETAILS Name: [name] Age: 78 y.o. Sex: male Date of Birth: 1936-06-04 Admit Date: 05/27/2014 GGY:IRSWNIOE, [name], MD   CHIEF COMPLAINT:   generalized weakness going on for the past 2 weeks, however much weaker for the past 3 or 4 days  HPI: [name] is a 78 y.o. male with a Past Medical History of COPD on home 02, chronic systolic heart failure , recently discharged from the Gen. Surgery service after  Excision in closure of the midline decubitus ulcerwho presents today with the above noted complaint. Per patient, he is just not been feeling his usual self for the past 2-3 days. Claims to have significant weakness, he claims it's a struggle walking around his house. He normally uses a cane and a walker to walk. He currently is on doxycycline for a "MRSA" infection of his wound. He denies any fever or febrile illness. No nausea, vomiting or diarrhea with the past few days. He denies any abdominal pain. He presented to the ED with these complaints, was found to have mild hyperkalemia, UA was consistent with UTI. During my evaluation, it appeared that he had some pus in his sacral wound dressing, and appears to he may have had a wound dehiscence. Patient is now being admitted for further evaluation and treatment   ALLERGIES:  No Known Allergies  PAST MEDICAL HISTORY: Past Medical History  Diagnosis Date  . Hypokalemia   . Colon cancer   . PVD (peripheral vascular disease)   . HTN (hypertension)   . Automatic implantable cardiac defibrillator in situ   . CHF (congestive heart failure)   . Ventricular tachycardia   . Ischemic heart disease, chronic   . Dyslipidemia   . CAD (coronary artery disease)   . On home oxygen therapy   . Pacemaker   . Self-catheterizes urinary bladder     since back surgery.  6- 7 times a day.  . Difficult intubation     pt states "not aware of difficult intubation, never told"  . Heart murmur     dx 1974  . Stroke     TIA-  1980's  . Shortness of breath   . Diabetes mellitus without complication     boderline  . Head injury, closed, with brief LOC 2013  . Neurogenic bladder     since back surgery - self caths  . Neurogenic bowel     digitally stimulates daily.  Marland Kitchen COPD (chronic obstructive pulmonary disease)      PT USES OXYGEN 24 HRS A DAY - 2 L NASAL CANNULA  . Emphysema   . Anemia     SERUM MONOCLONAL IgM KAPPA PROTEIN - NO CLINICAL EVIDENCE FOR PROGRESSION TO MULTIPLE MYELOMA OR OTHER PROLIFERATIVE DISORDER - PER OFFICE NOTE DR. SHERRILL DATED 12/30/13    PAST SURGICAL HISTORY: Past Surgical History  Procedure Laterality Date  . Lumbar back surgery      x 3  fusion- lumbar  . Iliac artery stent      left  . Implantation of medtronic concerto bi-v icd  70-35-00    Cristopher Peru MD  . Colonoscopy    . Polypectomy    . Insertion of iliac stent  06/04/2012    right external   . Cardiac catheterization    . Insert / replace / remove pacemaker    . Endarterectomy femoral  06/28/2012    Procedure: ENDARTERECTOMY FEMORAL;  Surgeon: Serafina Mitchell, MD;  Location: [facility]  OR;  Service: Vascular;  Laterality: Left;  . Patch angioplasty  06/28/2012    Procedure: PATCH ANGIOPLASTY;  Surgeon: Serafina Mitchell, MD;  Location: [facility] OR;  Service: Vascular;  Laterality: Left;  Using 0.8 cm x 15.2 cm Hemashield Patch.  . Colon surgery  03/2004  . Eye surgery Bilateral     cataracts  . Cholecystectomy    . Coronary artery bypass graft  1998    x3 vessels  . Femoral-popliteal bypass graft Right 03/28/2013    Procedure: BYPASS GRAFT FEMORAL-BELOW KNEE POPLITEAL ARTERY Using 83mm x 80 cm Propaten Graft ,With Right Common Femoral Artery Endarterectomy.;  Surgeon: Serafina Mitchell, MD;  Location: [facility] OR;  Service: Vascular;  Laterality: Right;  . Debridment of decubitus ulcer N/A 05/18/2014    Procedure: EXCISION AND CLOSURE OF DECUBITUS ULCER 5CM;  Surgeon: Pedro Earls, MD;  Location: [facility] ORS;  Service: General;   Laterality: N/A;    MEDICATIONS AT HOME: Prior to Admission medications   Medication Sig Start Date End Date Taking? Authorizing Provider  albuterol (PROVENTIL HFA;VENTOLIN HFA) 108 (90 BASE) MCG/ACT inhaler Inhale 2 puffs into the lungs every 6 (six) hours as needed for wheezing or shortness of breath.  10/02/12  Yes Kathee Delton, MD  albuterol (PROVENTIL) (2.5 MG/3ML) 0.083% nebulizer solution Take 2.5 mg by nebulization 4 (four) times daily.    Yes Historical Provider, MD  amLODipine (NORVASC) 5 MG tablet TAKE 1 TABLET DAILY   Yes Belva Crome III, MD  aspirin EC 325 MG tablet Take 325 mg by mouth at bedtime.   Yes Historical Provider, MD  budesonide (PULMICORT) 0.5 MG/2ML nebulizer solution Take 0.5 mg by nebulization 2 (two) times daily.    Yes Historical Provider, MD  candesartan (ATACAND) 32 MG tablet Take 32 mg by mouth every morning.    Yes Historical Provider, MD  carvedilol (COREG) 25 MG tablet Take 25 mg by mouth 2 (two) times daily with a meal.   Yes Historical Provider, MD  digoxin (LANOXIN) 0.125 MG tablet Take 0.125 mg by mouth every morning.   Yes Historical Provider, MD  doxycycline (DORYX) 100 MG DR capsule Take 100 mg by mouth 2 (two) times daily.   Yes Historical Provider, MD  ferrous sulfate 325 (65 FE) MG tablet Take 325 mg by mouth at bedtime.    Yes Historical Provider, MD  furosemide (LASIX) 40 MG tablet TAKE 2 TABLETS IN THE MORNING AND 1 TABLET IN THE EVENING 04/23/14  Yes Kathee Delton, MD  guaiFENesin (MUCINEX) 600 MG 12 hr tablet Take 600 mg by mouth 2 (two) times daily as needed. congestion   Yes Historical Provider, MD  HYDROcodone-acetaminophen (VICODIN) 2.5-500 MG per tablet Take 1 tablet by mouth every 6 (six) hours as needed for pain.   Yes Historical Provider, MD  KLOR-CON M20 20 MEQ tablet TAKE 1 TABLET DAILY 02/17/14  Yes Kathee Delton, MD  LANOXIN 125 MCG tablet TAKE 1 TABLET DAILY 04/30/14  Yes Evans Lance, MD  mupirocin ointment (BACTROBAN) 2 %  Apply 1 application topically 2 (two) times daily as needed (Cuts and bruises).   Yes Historical Provider, MD  naproxen sodium (ANAPROX) 220 MG tablet Take 220 mg by mouth 2 (two) times daily as needed (Pain).   Yes Historical Provider, MD  nitroGLYCERIN (NITRODUR - DOSED IN MG/24 HR) 0.2 mg/hr patch Place 1 patch onto the skin daily.   Yes Historical Provider, MD  nitroGLYCERIN (NITROSTAT) 0.4 MG SL tablet  Place 0.4 mg under the tongue every 5 (five) minutes as needed for chest pain.   Yes Historical Provider, MD  predniSONE (DELTASONE) 10 MG tablet Take 10 mg by mouth daily. 02/12/13  Yes Kathee Delton, MD  PROVENTIL HFA 108 (90 BASE) MCG/ACT inhaler INHALE 1 TO 2 PUFFS INTO THE LUNGS EVERY 6 HOURS AS NEEDED FOR SHORTNESS OF BREATH 02/10/14  Yes Kathee Delton, MD  simvastatin (ZOCOR) 20 MG tablet Take 20 mg by mouth at bedtime.    Yes Historical Provider, MD  tiotropium (SPIRIVA) 18 MCG inhalation capsule Place 18 mcg into inhaler and inhale daily.    Yes Historical Provider, MD  acetaminophen (TYLENOL) 325 MG tablet Take 2 tablets (650 mg total) by mouth every 4 (four) hours as needed for headache or mild pain. Patient not taking: Reported on 05/27/2014 08/09/13   Erlene Quan, PA-C  PRESCRIPTION MEDICATION Uses 1L-2L of home Oxygen    Historical Provider, MD  sulfamethoxazole-trimethoprim (BACTRIM DS,SEPTRA DS) 800-160 MG per tablet Take 1 tablet by mouth 2 (two) times daily. 7 day course started 05-12-14    Historical Provider, MD    FAMILY HISTORY: Family History  Problem Relation Age of Onset  . Heart failure Father   . Diabetes Father   . Heart disease Father   . Hyperlipidemia Father   . Hypertension Father   . Heart disease Mother     before age 48  . Hyperlipidemia Mother   . Hypertension Mother     SOCIAL HISTORY:  reports that he quit smoking about 11 years ago. His smoking use included Cigarettes. He has a 144 pack-year smoking history. He quit smokeless tobacco use about 11  years ago. He reports that he drinks alcohol. He reports that he does not use illicit drugs.  REVIEW OF SYSTEMS:  Constitutional:   No  night sweats,  Fevers, chills,   HEENT:    No headaches, Difficulty swallowing,Sore throat,    Cardio-vascular: No chest pain,  Orthopnea, PND, swelling in lower extremitie  GI:  No heartburn, indigestion, abdominal pain, nausea, vomiting, diarrhea  Resp:  No coughing up of blood.No change in color of mucus.  Skin:  no rash or lesions.  GU:   No flank pain.  Musculoskeletal: No joint pain or swelling.  No decreased range of motion.    Psych: No change in mood or affect. No depression or anxiety.  No memory loss.   PHYSICAL EXAM: Blood pressure 116/42, pulse 81, temperature 97.7 F (36.5 C), temperature source Oral, resp. rate 21, SpO2 99 %.  General appearance :Awake, alert, not in any distress. Speech Clear. Not toxic Looking-but chronically sick looking. HEENT: Atraumatic and Normocephalic, pupils equally reactive to light and accomodation Neck: supple, no JVD. No cervical lymphadenopathy.  Chest: decreased air entry at bilateral bases, but otherwise clear without any rhonchi. CVS: S1 S2 irregular rate and rhythm Abdomen: Bowel sounds present, Non tender and not distended with no gaurding, rigidity or rebound. Extremities: B/L Lower Ext shows no edema, both legs are warm to touch Neurology: Non focal Skin:No Rash Wounds: sacral wound- some amount of pus in addressing seen, minimal amount of plus also squeezed out. Erythematous-looking wound. Suspected wound adhesions as a suture seems to have come out and the wound is open.  LABS ON ADMISSION:   Recent Labs  05/27/14 1430  NA 136*  K 5.9*  CL 95*  CO2 27  GLUCOSE 149*  BUN 57*  CREATININE 1.10  CALCIUM 9.2  Recent Labs  05/27/14 1430  AST 32  ALT 27  ALKPHOS 66  BILITOT 0.3  PROT 7.3  ALBUMIN 2.9*   No results for input(s): LIPASE, AMYLASE in the last 72  hours.  Recent Labs  05/27/14 1517  WBC 5.4  NEUTROABS 3.7  HGB 9.3*  HCT 29.2*  MCV 93.9  PLT 218   No results for input(s): CKTOTAL, CKMB, CKMBINDEX, TROPONINI in the last 72 hours. No results for input(s): DDIMER in the last 72 hours. Invalid input(s): POCBNP   RADIOLOGIC STUDIES ON ADMISSION: Dg Chest Port 1 View  05/27/2014   CLINICAL DATA:  Weakness, short of breath  EXAM: PORTABLE CHEST - 1 VIEW  COMPARISON:  08/08/2013  FINDINGS: Left-sided pacemaker overlies stable enlarged cardiac silhouette. No effusion, infiltrate, pneumothorax.No acute cardiopulmonary process.  IMPRESSION: No acute cardiopulmonary process.   Electronically Signed   By: Suzy Bouchard M.D.   On: 05/27/2014 14:53     EKG: Independently reviewed. Paced rhythm  ASSESSMENT AND PLAN: Present on Admission:  . Wound infection with dehisence: Will admit, start vancomycin/Zosyn. Already on doxycycline as outpatient-which will be held. Have consulted general surgery for further assistance. Will defer further radiological studies if needed for general surgery.  . Suspected UTI : start Zosyn, UA somewhat consistent with UTI -on the patient self catheterizes. Already on doxycycline, so culture may be sterile. Follow urine cultures anyway.  Marland Kitchen generalized weakness: secondary to above to condition, recent surgery. PT eval. Treat infection.  . Persistent atrial fibrillation: continue digoxin and Coreg. Monitoring telemetry.  . Hyperkalemia: Likely secondary to recently being on Bactrim ( only completed 2-3 days back) and also is on oral potassium. We'll give 1 dose of Kayexalate, recheck electrolytes in a.m.  . Essential hypertension: Will hold candesartan given hypercalcemia. Continue amlodipine, Lasix and Coreg for now.  . Dyslipidemia: continue statin  . Cardiomyopathy, ischemic /Chronic systolic heart failure- EF 20-25% 2D 08/08/13: Clinically compensated -in fact may be on the slightly dry side. Lasix today,  resume tomorrow at the lower dose of 40 mg twice a day. Continue Coreg. Hold candesartan due to Hyperkalemia.  . Coronary atherosclerosis: Without chest pain, monitoring telemetry. Continue aspirin, statin and beta blocker  . Biventricular automatic implantable cardioverter defibrillator in situ: Monitoring telemetry  . Chronic respiratory failure: secondary to COPD. Continue home O2  .  COPD: objective the wheezing , continue nebulized bronchodilators. Continue usual dosing of prednisone.   Further plan will depend as patient's clinical course evolves and further radiologic and laboratory data become available. Patient will be monitored closely.  Above noted plan was discussed with patientspouse, they were in agreement.   DVT Prophylaxis: Prophylactic  Heparin  Code Status: Full Code  Total time spent for admission equals 45 minutes.  [facility] Pager [phone]  If 7PM-7AM, please contact night-coverage [email or web address] Password [email or web address] 05/27/2014, 6:03 PM

## 2014-05-27 NOTE — ED Provider Notes (Signed)
CSN: [id number]     Arrival date & time 05/27/14  1333 History   First MD Initiated Contact with Patient 05/27/14 1342     Chief Complaint  Patient presents with  . Urinary Tract Infection  . Fatigue     (Consider location/radiation/quality/duration/timing/severity/associated sxs/prior Treatment) HPI 78 year old male with multiple medical problems including COPD on home oxygen, coronary artery disease, heart failure, diabetes, neurogenic bladder, recent urinary tract infection, recent hospitalization for repair of decubitus ulcer, states at baseline he has chronic weakness with chronic fatigue chronic shortness of breath uses a walker at home but since discharge in the hospital within the last couple weeks has progressively worsening generalized weakness and fatigue and shortness of breath such that today he is unable to stand up even with his walker at home. There is no fever no confusion no vomiting today but he does have history of vomiting intermittently over the last few years with no chest pain today no cough no abdominal pain today no vomiting today no bloody stools just generalized weakness generalized fatigue and exertional shortness of breath all gradually worsening over the last couple weeks compared to baseline. Past Medical History  Diagnosis Date  . Hypokalemia   . PVD (peripheral vascular disease)   . HTN (hypertension)   . CHF (congestive heart failure)   . Ventricular tachycardia   . Ischemic heart disease, chronic   . Dyslipidemia   . CAD (coronary artery disease)   . On home oxygen therapy     "~ 2.5L; pretty much 24/7" (05/27/2014)  . Pacemaker   . Self-catheterizes urinary bladder     since back surgery.  6- 7 times a day. (05/27/2014)  . Difficult intubation     pt states "not aware of difficult intubation, never told"  . Heart murmur     dx 1974  . Shortness of breath   . Head injury, closed, with brief LOC 2013  . Neurogenic bladder     since back surgery -  self caths  . Neurogenic bowel     digitally stimulates daily.  Marland Kitchen COPD (chronic obstructive pulmonary disease)      PT USES OXYGEN 24 HRS A DAY - 2 L NASAL CANNULA  . Emphysema   . Anemia     SERUM MONOCLONAL IgM KAPPA PROTEIN - NO CLINICAL EVIDENCE FOR PROGRESSION TO MULTIPLE MYELOMA OR OTHER PROLIFERATIVE DISORDER - PER OFFICE NOTE DR. SHERRILL DATED 12/30/13  . Pneumonia X 2  . Borderline type 2 diabetes mellitus   . AICD (automatic cardioverter/defibrillator) present   . TIA (transient ischemic attack) 1980's    "they say I did; I don't believe it"  . Colon cancer    Past Surgical History  Procedure Laterality Date  . Lumbar disc surgery  X 2  . Iliac artery stent Left   . Bi-ventricular implantable cardioverter defibrillator  (crt-d)  04-10-08    implantation of Medtronic Concerto Bi-V ICD [; Cristopher Peru MD  . Colonoscopy    . Polypectomy    . Insertion of iliac stent Right 06/04/2012    external   . Cardiac catheterization    . Insert / replace / remove pacemaker    . Endarterectomy femoral  06/28/2012    Procedure: ENDARTERECTOMY FEMORAL;  Surgeon: Serafina Mitchell, MD;  Location: [facility];  Service: Vascular;  Laterality: Left;  . Patch angioplasty  06/28/2012    Procedure: PATCH ANGIOPLASTY;  Surgeon: Serafina Mitchell, MD;  Location: [facility];  Service: Vascular;  Laterality: Left;  Using 0.8 cm x 15.2 cm Hemashield Patch.  . Colon surgery  03/2004  . Cataract extraction w/ intraocular lens  implant, bilateral Bilateral   . Cholecystectomy    . Coronary artery bypass graft  1998    x3 vessels  . Femoral-popliteal bypass graft Right 03/28/2013    Procedure: BYPASS GRAFT FEMORAL-BELOW KNEE POPLITEAL ARTERY Using 26m x 80 cm Propaten Graft ,With Right Common Femoral Artery Endarterectomy.;  Surgeon: VSerafina Mitchell MD;  Location: [facility];  Service: Vascular;  Laterality: Right;  . Debridment of decubitus ulcer N/A 05/18/2014    Procedure: EXCISION AND CLOSURE OF DECUBITUS ULCER 5CM;   Surgeon: MPedro Earls MD;  Location: [facility];  Service: General;  Laterality: N/A;  . Back surgery    . Posterior lumbar fusion  X 1   Family History  Problem Relation Age of Onset  . Heart failure Father   . Diabetes Father   . Heart disease Father   . Hyperlipidemia Father   . Hypertension Father   . Heart disease Mother     before age 76 . Hyperlipidemia Mother   . Hypertension Mother    History  Substance Use Topics  . Smoking status: Former Smoker -- 3.00 packs/day for 48 years    Types: Cigarettes    Quit date: 07/03/2002  . Smokeless tobacco: Never Used  . Alcohol Use: Yes     Comment: 05/27/2014 "a beer q 6 months or so"    Review of Systems  10 Systems reviewed and are negative for acute change except as noted in the HPI.  Allergies  Review of patient's allergies indicates no known allergies.  Home Medications   Prior to Admission medications   Medication Sig Start Date End Date Taking? Authorizing Provider  albuterol (PROVENTIL HFA;VENTOLIN HFA) 108 (90 BASE) MCG/ACT inhaler Inhale 2 puffs into the lungs every 6 (six) hours as needed for wheezing or shortness of breath.  10/02/12  Yes KKathee Delton MD  albuterol (PROVENTIL) (2.5 MG/3ML) 0.083% nebulizer solution Take 2.5 mg by nebulization 4 (four) times daily.    Yes Historical Provider, MD  amLODipine (NORVASC) 5 MG tablet TAKE 1 TABLET DAILY   Yes HBelva CromeIII, MD  aspirin EC 325 MG tablet Take 325 mg by mouth at bedtime.   Yes Historical Provider, MD  budesonide (PULMICORT) 0.5 MG/2ML nebulizer solution Take 0.5 mg by nebulization 2 (two) times daily.    Yes Historical Provider, MD  candesartan (ATACAND) 32 MG tablet Take 32 mg by mouth every morning.    Yes Historical Provider, MD  carvedilol (COREG) 25 MG tablet Take 25 mg by mouth 2 (two) times daily with a meal.   Yes Historical Provider, MD  digoxin (LANOXIN) 0.125 MG tablet Take 0.125 mg by mouth every morning.   Yes Historical Provider, MD   doxycycline (DORYX) 100 MG DR capsule Take 100 mg by mouth 2 (two) times daily.   Yes Historical Provider, MD  ferrous sulfate 325 (65 FE) MG tablet Take 325 mg by mouth at bedtime.    Yes Historical Provider, MD  furosemide (LASIX) 40 MG tablet TAKE 2 TABLETS IN THE MORNING AND 1 TABLET IN THE EVENING 04/23/14  Yes KKathee Delton MD  guaiFENesin (MUCINEX) 600 MG 12 hr tablet Take 600 mg by mouth 2 (two) times daily as needed. congestion   Yes Historical Provider, MD  HYDROcodone-acetaminophen (VICODIN) 2.5-500 MG per tablet Take 1 tablet by mouth  every 6 (six) hours as needed for pain.   Yes Historical Provider, MD  KLOR-CON M20 20 MEQ tablet TAKE 1 TABLET DAILY 02/17/14  Yes Kathee Delton, MD  LANOXIN 125 MCG tablet TAKE 1 TABLET DAILY 04/30/14  Yes Evans Lance, MD  mupirocin ointment (BACTROBAN) 2 % Apply 1 application topically 2 (two) times daily as needed (Cuts and bruises).   Yes Historical Provider, MD  naproxen sodium (ANAPROX) 220 MG tablet Take 220 mg by mouth 2 (two) times daily as needed (Pain).   Yes Historical Provider, MD  nitroGLYCERIN (NITRODUR - DOSED IN MG/24 HR) 0.2 mg/hr patch Place 1 patch onto the skin daily.   Yes Historical Provider, MD  nitroGLYCERIN (NITROSTAT) 0.4 MG SL tablet Place 0.4 mg under the tongue every 5 (five) minutes as needed for chest pain.   Yes Historical Provider, MD  predniSONE (DELTASONE) 10 MG tablet Take 10 mg by mouth daily. 02/12/13  Yes Kathee Delton, MD  PROVENTIL HFA 108 (90 BASE) MCG/ACT inhaler INHALE 1 TO 2 PUFFS INTO THE LUNGS EVERY 6 HOURS AS NEEDED FOR SHORTNESS OF BREATH 02/10/14  Yes Kathee Delton, MD  simvastatin (ZOCOR) 20 MG tablet Take 20 mg by mouth at bedtime.    Yes Historical Provider, MD  tiotropium (SPIRIVA) 18 MCG inhalation capsule Place 18 mcg into inhaler and inhale daily.    Yes Historical Provider, MD  acetaminophen (TYLENOL) 325 MG tablet Take 2 tablets (650 mg total) by mouth every 4 (four) hours as needed for  headache or mild pain. Patient not taking: Reported on 05/27/2014 08/09/13   Erlene Quan, PA-C  PRESCRIPTION MEDICATION Uses 1L-2L of home Oxygen    Historical Provider, MD  sulfamethoxazole-trimethoprim (BACTRIM DS,SEPTRA DS) 800-160 MG per tablet Take 1 tablet by mouth 2 (two) times daily. 7 day course started 05-12-14    Historical Provider, MD   BP 114/49 mmHg  Pulse 84  Temp(Src) 97.9 F (36.6 C) (Oral)  Resp 19  Ht _0  (1.702 m)  Wt 134 lb 14.7 oz (61.2 kg)  BMI 21.13 kg/m2  SpO2 100% Physical Exam  Constitutional:  Awake, alert, nontoxic appearance.  HENT:  Head: Atraumatic.  Eyes: Right eye exhibits no discharge. Left eye exhibits no discharge.  Neck: Neck supple.  Cardiovascular: Normal rate and regular rhythm.   No murmur heard. Pulmonary/Chest: Effort normal and breath sounds normal. No respiratory distress. He has no wheezes. He has no rales. He exhibits no tenderness.  Pulse oximetry normal at 99% on his baseline supplemental nasal cannula oxygen  Abdominal: Soft. Bowel sounds are normal. He exhibits no distension and no mass. There is no tenderness. There is no rebound and no guarding.  Musculoskeletal: He exhibits no tenderness.  Baseline ROM, no obvious new focal weakness.  Neurological: He is alert.  Mental status and motor strength appears baseline for patient and situation.  Skin: No rash noted.  Psychiatric: He has a normal mood and affect.  Nursing note and vitals reviewed.   ED Course  Procedures (including critical care time) D/w [facility] for Obs. Patient / Family / Caregiver informed of clinical course, understand medical decision-making process, and agree with plan. Labs Review Labs Reviewed  COMPREHENSIVE METABOLIC PANEL - Abnormal; Notable for the following:    Sodium 136 (*)    Potassium 5.9 (*)    Chloride 95 (*)    Glucose, Bld 149 (*)    BUN 57 (*)    Albumin 2.9 (*)  GFR calc non Af Amer 63 (*)    GFR calc Af Amer 73 (*)    All other  components within normal limits  URINALYSIS, ROUTINE W REFLEX MICROSCOPIC - Abnormal; Notable for the following:    Leukocytes, UA TRACE (*)    All other components within normal limits  PRO B NATRIURETIC PEPTIDE - Abnormal; Notable for the following:    Pro B Natriuretic peptide (BNP) 1578.0 (*)    All other components within normal limits  CBC WITH DIFFERENTIAL - Abnormal; Notable for the following:    RBC 3.11 (*)    Hemoglobin 9.3 (*)    HCT 29.2 (*)    Monocytes Relative 16 (*)    All other components within normal limits  URINE MICROSCOPIC-ADD ON - Abnormal; Notable for the following:    Bacteria, UA FEW (*)    Casts HYALINE CASTS (*)    All other components within normal limits  BASIC METABOLIC PANEL - Abnormal; Notable for the following:    Glucose, Bld 158 (*)    BUN 48 (*)    GFR calc non Af Amer 78 (*)    All other components within normal limits  CBC - Abnormal; Notable for the following:    RBC 2.97 (*)    Hemoglobin 8.7 (*)    HCT 28.1 (*)    All other components within normal limits  CULTURE, BLOOD (ROUTINE X 2)  URINE CULTURE  CULTURE, BLOOD (ROUTINE X 2)  URINE CULTURE  CLOSTRIDIUM DIFFICILE BY PCR  POTASSIUM  CBC WITH DIFFERENTIAL  I-STAT CG4 LACTIC ACID, ED  I-STAT TROPOININ, ED  I-STAT CG4 LACTIC ACID, ED    Imaging Review Dg Chest Port 1 View  05/27/2014   CLINICAL DATA:  Weakness, short of breath  EXAM: PORTABLE CHEST - 1 VIEW  COMPARISON:  08/08/2013  FINDINGS: Left-sided pacemaker overlies stable enlarged cardiac silhouette. No effusion, infiltrate, pneumothorax.No acute cardiopulmonary process.  IMPRESSION: No acute cardiopulmonary process.   Electronically Signed   By: Suzy Bouchard M.D.   On: 05/27/2014 14:53     EKG Interpretation   Date/Time:  Wednesday May 27 2014 14:21:25 EST Ventricular Rate:  79 PR Interval:    QRS Duration: 158 QT Interval:  409 QTC Calculation: 469 R Axis:   -109 Text Interpretation:  Electronic  ventricular pacemaker Afib/flut and  V-paced complexes No further analysis attempted due to paced rhythm  Baseline wander in lead(s) I III aVL No significant change since last  tracing Confirmed by [facility]  MD, Jenny Reichmann ([id number]) on 05/27/2014 2:26:20 PM      MDM   Final diagnoses:  Weakness generalized  Urinary tract infection without hematuria, site unspecified  Hyperkalemia  Renal insufficiency    The patient appears reasonably stabilized for admission considering the current resources, flow, and capabilities available in the ED at this time, and I doubt any other [facility] requiring further screening and/or treatment in the ED prior to admission.    Babette Relic, MD 05/28/14 [phone]

## 2014-05-27 NOTE — Progress Notes (Signed)
Patient arrived on unit from ED via stretcher, wife at bedside.  Telemetry placed per MD order.  Admission RN notified for admission.

## 2014-05-27 NOTE — Progress Notes (Signed)
Pt had decubitus ulcer debrided last week by Dr Hassell Done. Admitted to medicine for failure to thrive.  Wound open and draining.  On inspection,  Wound open in midline and erythema noted.  No pus  Separation in the  Midline.  Not tender.  Will follow. Dry dressing  For now.  Change daily.

## 2014-05-27 NOTE — ED Notes (Signed)
Ordered dinner tray, carb modified diet

## 2014-05-27 NOTE — ED Notes (Addendum)
Spoke with MD regarding potassium

## 2014-05-27 NOTE — ED Notes (Signed)
Pt states he feels very tired and fatigued. Denies pain at the time. Currently being treated for UTI. Pt is alert and oriented x4.

## 2014-05-28 DIAGNOSIS — I10 Essential (primary) hypertension: Secondary | ICD-10-CM

## 2014-05-28 DIAGNOSIS — R531 Weakness: Secondary | ICD-10-CM

## 2014-05-28 LAB — BASIC METABOLIC PANEL
ANION GAP: 13 (ref 5–15)
BUN: 48 mg/dL — ABNORMAL HIGH (ref 6–23)
CO2: 26 meq/L (ref 19–32)
Calcium: 8.5 mg/dL (ref 8.4–10.5)
Chloride: 101 mEq/L (ref 96–112)
Creatinine, Ser: 0.97 mg/dL (ref 0.50–1.35)
GFR, EST NON AFRICAN AMERICAN: 78 mL/min — AB (ref >90)
Glucose, Bld: 158 mg/dL — ABNORMAL HIGH (ref 70–99)
Potassium: 4.4 mEq/L (ref 3.7–5.3)
SODIUM: 140 meq/L (ref 137–147)

## 2014-05-28 LAB — CBC
HEMATOCRIT: 28.1 % — AB (ref 39.0–52.0)
Hemoglobin: 8.7 g/dL — ABNORMAL LOW (ref 13.0–17.0)
MCH: 29.3 pg (ref 26.0–34.0)
MCHC: 31 g/dL (ref 30.0–36.0)
MCV: 94.6 fL (ref 78.0–100.0)
PLATELETS: 220 10*3/uL (ref 150–400)
RBC: 2.97 MIL/uL — ABNORMAL LOW (ref 4.22–5.81)
RDW: 14.8 % (ref 11.5–15.5)
WBC: 5.3 10*3/uL (ref 4.0–10.5)

## 2014-05-28 LAB — GLUCOSE, CAPILLARY: GLUCOSE-CAPILLARY: 220 mg/dL — AB (ref 70–99)

## 2014-05-28 NOTE — Progress Notes (Signed)
PROGRESS NOTE  [name] LPF:[id number] DOB: 22-Sep-1935 DOA: 05/27/2014 PCP: Dorian Heckle, MD  Assessment/Plan: . Wound dehisence:  -discontinue IV antibiotics and observed as the patient is afebrile without any leukocytosis and hemodynamically stable -Continue wound care per general surgery recommendations -Scant amount of yellow serous change was noted without any pus -Wound care recommendations per general surgery  . generalized weakness:  -Likely due to going depletion -Certainly, the patient may have an infectious process -Discontinue antibiotics and observe -Continue IV fluids judiciously  . Persistent atrial fibrillation:  -continue digoxin and Coreg. Monitoring telemetry. -Presently rate controlled  . Hyperkalemia: Likely secondary to recently being on Bactrim ( only completed 2-3 days back) and also is on oral potassium.  -given 1 dose of Kayexalate, -Improved  . Essential hypertension:  -Will hold candesartan given hyperkalemia.  -Continue  Lasix and Coreg for now. -hold amlodipine due to soft BP  . Dyslipidemia: continue statin  . Cardiomyopathy, ischemic /Chronic systolic heart failure- EF 20-25% 2D 08/08/13: Clinically compensated -in fact may be on the slightly dry side.  -Lasix held yesterday today,  -resume today at the lower dose of 40 mg twice a day.  -Continue Coreg. Hold candesartan due to Hyperkalemia.  . Coronary atherosclerosis: Without chest pain, monitoring telemetry. Continue aspirin, statin and beta blocker  . Biventricular automatic implantable cardioverter defibrillator in situ: Monitoring telemetry  . Chronic respiratory failure:  -secondary to COPD. Continue home O -stable  . COPD:  -continue nebulized bronchodilators. Continue usual dosing of prednisone. -no wheeze on exam     Family Communication:   Pt at beside Disposition Plan:   Home when medically stable       Procedures/Studies: Dg Chest Port 1  View  05/27/2014   CLINICAL DATA:  Weakness, short of breath  EXAM: PORTABLE CHEST - 1 VIEW  COMPARISON:  08/08/2013  FINDINGS: Left-sided pacemaker overlies stable enlarged cardiac silhouette. No effusion, infiltrate, pneumothorax.No acute cardiopulmonary process.  IMPRESSION: No acute cardiopulmonary process.   Electronically Signed   By: Suzy Bouchard M.D.   On: 05/27/2014 14:53         Subjective: Patient is feeling better today. He had 2 loose bowel movements last night. Denies any fevers, chills, chest pain, vomiting, diarrhea, abdominal pain. He is chronically short of breath, but states this is not worsening usual.  Objective: Filed Vitals:   05/27/14 1847 05/27/14 2032 05/27/14 2258 05/28/14 0418  BP: 110/42 106/51  114/49  Pulse: 88 78 85 84  Temp: 98 F (36.7 C) 98.2 F (36.8 C)  97.9 F (36.6 C)  TempSrc: Oral Oral  Oral  Resp: 28 19 22 19   Height: 5\' 7"  (1.702 m)     Weight: 61.2 kg (134 lb 14.7 oz)     SpO2: 99% 99% 99% 100%    Intake/Output Summary (Last 24 hours) at 05/28/14 1032 Last data filed at 05/28/14 0800  Gross per 24 hour  Intake    240 ml  Output   1100 ml  Net   -860 ml   Weight change:  Exam:   General:  Pt is alert, follows commands appropriately, not in acute distress  HEENT: No icterus, No thrush, No meningismus, [place]/AT  Cardiovascular: RRR, S1/S2, no rubs, no gallops  Respiratory: Bibasilar rales. No wheezing. Good air movement  Abdomen: Soft/+BS, non tender, non distended, no guarding  Extremities: No edema, No lymphangitis, No petechiae, No rashes, no synovitis  Data Reviewed:  Basic Metabolic Panel:  Recent Labs Lab 05/27/14 1430 05/27/14 1715 05/28/14 0449  NA 136*  --  140  K 5.9* 4.8 4.4  CL 95*  --  101  CO2 27  --  26  GLUCOSE 149*  --  158*  BUN 57*  --  48*  CREATININE 1.10  --  0.97  CALCIUM 9.2  --  8.5   Liver Function Tests:  Recent Labs Lab 05/27/14 1430  AST 32  ALT 27  ALKPHOS 66  BILITOT  0.3  PROT 7.3  ALBUMIN 2.9*   No results for input(s): LIPASE, AMYLASE in the last 168 hours. No results for input(s): AMMONIA in the last 168 hours. CBC:  Recent Labs Lab 05/27/14 1517 05/28/14 0449  WBC 5.4 5.3  NEUTROABS 3.7  --   HGB 9.3* 8.7*  HCT 29.2* 28.1*  MCV 93.9 94.6  PLT 218 220   Cardiac Enzymes: No results for input(s): CKTOTAL, CKMB, CKMBINDEX, TROPONINI in the last 168 hours. BNP: Invalid input(s): POCBNP CBG: No results for input(s): GLUCAP in the last 168 hours.  No results found for this or any previous visit (from the past 240 hour(s)).   Scheduled Meds: . albuterol  2.5 mg Nebulization QID  . amLODipine  5 mg Oral Daily  . antiseptic oral rinse  7 mL Mouth Rinse BID  . aspirin EC  325 mg Oral QHS  . budesonide  0.5 mg Nebulization BID  . carvedilol  25 mg Oral BID WC  . digoxin  0.125 mg Oral q morning - 10a  . ferrous sulfate  325 mg Oral QHS  . furosemide  40 mg Oral BID  . heparin  5,000 Units Subcutaneous 3 times per day  . nitroGLYCERIN  0.2 mg Transdermal Daily  . predniSONE  10 mg Oral Daily  . simvastatin  20 mg Oral QHS  . tiotropium  18 mcg Inhalation Daily   Continuous Infusions:    Thang Flett, DO  [facility] Pager [phone]  If 7PM-7AM, please contact night-coverage [email or web address] Password [email or web address] 05/28/2014, 10:32 AM   LOS: 1 day

## 2014-05-28 NOTE — Plan of Care (Signed)
Problem: Consults Goal: General Medical Patient Education See Patient Education Module for specific education. Outcome: Completed/Met Date Met:  05/28/14 Goal: Nutrition Consult-if indicated Outcome: Not Applicable Date Met:  93/71/69 Goal: Diabetes Guidelines if Diabetic/Glucose > 140 If diabetic or lab glucose is > 140 mg/dl - Initiate Diabetes/Hyperglycemia Guidelines & Document Interventions  Outcome: Not Applicable Date Met:  67/89/38  Problem: Phase I Progression Outcomes Goal: Pain controlled with appropriate interventions Outcome: Completed/Met Date Met:  05/28/14
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
## 2014-05-29 ENCOUNTER — Inpatient Hospital Stay (HOSPITAL_COMMUNITY): Payer: Medicare Other

## 2014-05-29 DIAGNOSIS — I255 Ischemic cardiomyopathy: Secondary | ICD-10-CM

## 2014-05-29 DIAGNOSIS — Z95810 Presence of automatic (implantable) cardiac defibrillator: Secondary | ICD-10-CM

## 2014-05-29 DIAGNOSIS — E43 Unspecified severe protein-calorie malnutrition: Secondary | ICD-10-CM | POA: Insufficient documentation

## 2014-05-29 LAB — COMPREHENSIVE METABOLIC PANEL
ALBUMIN: 2.4 g/dL — AB (ref 3.5–5.2)
ALK PHOS: 68 U/L (ref 39–117)
ALT: 29 U/L (ref 0–53)
ANION GAP: 13 (ref 5–15)
AST: 23 U/L (ref 0–37)
BUN: 32 mg/dL — AB (ref 6–23)
CALCIUM: 8.6 mg/dL (ref 8.4–10.5)
CO2: 28 mEq/L (ref 19–32)
CREATININE: 0.93 mg/dL (ref 0.50–1.35)
Chloride: 101 mEq/L (ref 96–112)
GFR calc non Af Amer: 80 mL/min — ABNORMAL LOW (ref >90)
Glucose, Bld: 119 mg/dL — ABNORMAL HIGH (ref 70–99)
POTASSIUM: 3.8 meq/L (ref 3.7–5.3)
Sodium: 142 mEq/L (ref 137–147)
TOTAL PROTEIN: 6 g/dL (ref 6.0–8.3)
Total Bilirubin: 0.3 mg/dL (ref 0.3–1.2)

## 2014-05-29 LAB — CBC
HEMATOCRIT: 26.9 % — AB (ref 39.0–52.0)
Hemoglobin: 8.4 g/dL — ABNORMAL LOW (ref 13.0–17.0)
MCH: 29.6 pg (ref 26.0–34.0)
MCHC: 31.2 g/dL (ref 30.0–36.0)
MCV: 94.7 fL (ref 78.0–100.0)
Platelets: 210 10*3/uL (ref 150–400)
RBC: 2.84 MIL/uL — ABNORMAL LOW (ref 4.22–5.81)
RDW: 14.5 % (ref 11.5–15.5)
WBC: 5.1 10*3/uL (ref 4.0–10.5)

## 2014-05-29 LAB — URINE CULTURE: Colony Count: 2000

## 2014-05-29 LAB — TSH: TSH: 0.547 u[IU]/mL (ref 0.350–4.500)

## 2014-05-29 MED ORDER — ENSURE COMPLETE PO LIQD
237.0000 mL | Freq: Two times a day (BID) | ORAL | Status: DC
  Administered 2014-05-29 – 2014-06-02 (×7): 237 mL via ORAL

## 2014-05-29 MED ORDER — ENSURE COMPLETE PO LIQD: 237.0000 mL | Freq: Two times a day (BID) | ORAL | Status: DC

## 2014-05-29 MED ORDER — HYDROCODONE-ACETAMINOPHEN 5-325 MG PO TABS
1.0000 | ORAL_TABLET | Freq: Four times a day (QID) | ORAL | Status: DC | PRN
  Administered 2014-05-29: 1 via ORAL
  Filled 2014-05-29: qty 1

## 2014-05-29 NOTE — Progress Notes (Addendum)
INITIAL NUTRITION ASSESSMENT  DOCUMENTATION CODES Per approved criteria  -Severe malnutrition in the context of acute illness or injury   INTERVENTION: Ensure Complete po BID, each supplement provides 350 kcal and 13 grams of protein RD to follow for nutrition care plan  NUTRITION DIAGNOSIS: Increased nutrient needs related to wound infection as evidenced by estimated nutrition needs  Goal: Pt to meet >/= 90% of their estimated nutrition needs   Monitor:  PO & supplemental intake, weight, labs, I/O's  Reason for Assessment: Malnutrition Screening Tool Report  78 y.o. male  Admitting Dx: Wound infection  ASSESSMENT: 78 y.o. Male with a PMH of COPD, chronic systolic heart failure, recently discharged from the Gen. Surgery service afterexcision in closure of the midline decubitus ulcer who presented with generalized weakness.  In ED was found to have mild hyperkalemia; UA was consistent with UTI; + pus in his sacral wound dressing.  Patient reports a poor appetite since his surgery on 11/16 (excision and closure of decubitus ulcer); PTA pt reports consuming 2 small meals per day; current PO intake 30% per flowsheet records; his wife states he's lost approximately 10 lbs since his sx; per wt readings, pt has had a 9% x 2 weeks (severe for time); has started drinking Ensure/Boost supplements at home; amenable to having during hospitalization; RD to order.  Nutrition Focused Physical Exam:  Subcutaneous Fat:  Orbital Region: N/A Upper Arm Region: mild to moderate depletion Thoracic and Lumbar Region: N/A  Muscle:  Temple Region: N/A Clavicle Bone Region: mild to moderate depletion Clavicle and Acromion Bone Region: mild to moderate depletion Scapular Bone Region: N/A Dorsal Hand: N/A Patellar Region: mild to moderate depletion Anterior Thigh Region: mild to moderate depletion Posterior Calf Region: mild to moderate depletion  Edema: none  Patient meets criteria for  severe malnutrition in the context of acute illness or injury as evidenced by < 50% intake of estimated energy requirement for > 5 days and 9% weight loss in < 1 month.  Height: Ht Readings from Last 1 Encounters:  05/27/14 $RemoveB'5\' 7"'[id number]$  (1.702 m)    Weight: Wt Readings from Last 1 Encounters:  05/28/14 134 lb 14.7 oz (61.199 kg)    Ideal Body Weight: 148 lb  % Ideal Body Weight: 91%  Wt Readings from Last 10 Encounters:  05/28/14 134 lb 14.7 oz (61.199 kg)  05/18/14 147 lb (66.679 kg)  05/15/14 147 lb (66.679 kg)  03/23/14 162 lb 12.8 oz (73.846 kg)  03/19/14 162 lb (73.483 kg)  02/23/14 160 lb (72.576 kg)  02/05/14 164 lb 3.2 oz (74.481 kg)  12/30/13 163 lb 6.4 oz (74.118 kg)  12/03/13 168 lb 12.8 oz (76.567 kg)  11/17/13 173 lb (78.472 kg)    Usual Body Weight: 147 lb  % Usual Body Weight: 91%  BMI:  Body mass index is 21.13 kg/(m^2).  Estimated Nutritional Needs: Kcal: 1800-2000 Protein: 90-100 gm Fluid: 1.8-2.0 L  Skin: sacral wound (bone and subcutaneous tissue visible, loose suture approximation of skin, peri-wound tissue is macerated and erythematous)  Diet Order: Diet heart healthy/carb modified  EDUCATION NEEDS: -No education needs identified at this time   Intake/Output Summary (Last 24 hours) at 05/29/14 1158 Last data filed at 05/29/14 5916  Gross per 24 hour  Intake    360 ml  Output   1450 ml  Net  -1090 ml    Labs:   Recent Labs Lab 05/27/14 1430 05/27/14 1715 05/28/14 0449 05/29/14 0450  NA 136*  --  140  142  K 5.9* 4.8 4.4 3.8  CL 95*  --  101 101  CO2 27  --  26 28  BUN 57*  --  48* 32*  CREATININE 1.10  --  0.97 0.93  CALCIUM 9.2  --  8.5 8.6  GLUCOSE 149*  --  158* 119*    CBG (last 3)   Recent Labs  05/28/14 2004  GLUCAP 220*    Scheduled Meds: . albuterol  2.5 mg Nebulization QID  . antiseptic oral rinse  7 mL Mouth Rinse BID  . aspirin EC  325 mg Oral QHS  . budesonide  0.5 mg Nebulization BID  . carvedilol  25 mg  Oral BID WC  . digoxin  0.125 mg Oral q morning - 10a  . ferrous sulfate  325 mg Oral QHS  . furosemide  40 mg Oral BID  . heparin  5,000 Units Subcutaneous 3 times per day  . nitroGLYCERIN  0.2 mg Transdermal Daily  . predniSONE  10 mg Oral Daily  . simvastatin  20 mg Oral QHS  . tiotropium  18 mcg Inhalation Daily    Continuous Infusions:   Past Medical History  Diagnosis Date  . Hypokalemia   . PVD (peripheral vascular disease)   . HTN (hypertension)   . CHF (congestive heart failure)   . Ventricular tachycardia   . Ischemic heart disease, chronic   . Dyslipidemia   . CAD (coronary artery disease)   . On home oxygen therapy     "~ 2.5L; pretty much 24/7" (05/27/2014)  . Pacemaker   . Self-catheterizes urinary bladder     since back surgery.  6- 7 times a day. (05/27/2014)  . Difficult intubation     pt states "not aware of difficult intubation, never told"  . Heart murmur     dx 1974  . Shortness of breath   . Head injury, closed, with brief LOC 2013  . Neurogenic bladder     since back surgery - self caths  . Neurogenic bowel     digitally stimulates daily.  Marland Kitchen COPD (chronic obstructive pulmonary disease)      PT USES OXYGEN 24 HRS A DAY - 2 L NASAL CANNULA  . Emphysema   . Anemia     SERUM MONOCLONAL IgM KAPPA PROTEIN - NO CLINICAL EVIDENCE FOR PROGRESSION TO MULTIPLE MYELOMA OR OTHER PROLIFERATIVE DISORDER - PER OFFICE NOTE DR. SHERRILL DATED 12/30/13  . Pneumonia X 2  . Borderline type 2 diabetes mellitus   . AICD (automatic cardioverter/defibrillator) present   . TIA (transient ischemic attack) 1980's    "they say I did; I don't believe it"  . Colon cancer     Past Surgical History  Procedure Laterality Date  . Lumbar disc surgery  X 2  . Iliac artery stent Left   . Bi-ventricular implantable cardioverter defibrillator  (crt-d)  04-10-08    implantation of Medtronic Concerto Bi-V ICD [; Cristopher Peru MD  . Colonoscopy    . Polypectomy    . Insertion of  iliac stent Right 06/04/2012    external   . Cardiac catheterization    . Insert / replace / remove pacemaker    . Endarterectomy femoral  06/28/2012    Procedure: ENDARTERECTOMY FEMORAL;  Surgeon: Serafina Mitchell, MD;  Location: [facility];  Service: Vascular;  Laterality: Left;  . Patch angioplasty  06/28/2012    Procedure: PATCH ANGIOPLASTY;  Surgeon: Serafina Mitchell, MD;  Location: [facility];  Service: Vascular;  Laterality: Left;  Using 0.8 cm x 15.2 cm Hemashield Patch.  . Colon surgery  03/2004  . Cataract extraction w/ intraocular lens  implant, bilateral Bilateral   . Cholecystectomy    . Coronary artery bypass graft  1998    x3 vessels  . Femoral-popliteal bypass graft Right 03/28/2013    Procedure: BYPASS GRAFT FEMORAL-BELOW KNEE POPLITEAL ARTERY Using 37mm x 80 cm Propaten Graft ,With Right Common Femoral Artery Endarterectomy.;  Surgeon: Serafina Mitchell, MD;  Location: [facility];  Service: Vascular;  Laterality: Right;  . Debridment of decubitus ulcer N/A 05/18/2014    Procedure: EXCISION AND CLOSURE OF DECUBITUS ULCER 5CM;  Surgeon: Pedro Earls, MD;  Location: [facility];  Service: General;  Laterality: N/A;  . Back surgery    . Posterior lumbar fusion  X 1    Arthur Holms, RD, LDN Pager #: [phone] After-Hours Pager #: [phone]

## 2014-05-29 NOTE — Care Management Note (Signed)
CARE MANAGEMENT NOTE 05/29/2014  Patient:  [name], [name]   Account Number:  [id number]  Date Initiated:  05/29/2014  Documentation initiated by:  Ingeborg Fite  Subjective/Objective Assessment:   CM following for progression and d/c planning.     Action/Plan:   05/30/2015 Met with pt re d/c needs, pt states that was using [facility] for [facility] service and wishes to continue with "frankie" as his therapist. [facility] notified.   Anticipated DC Date:  05/31/2014   Anticipated DC Plan:  [place]         Choice offered to / List presented to:  C-1 Patient        [place] arranged  HH-1 RN  [place].   Status of service:  Completed, signed off Medicare Important Message given?   (If response is "NO", the following Medicare IM given date fields will be blank) Date Medicare IM given:   Medicare IM given by:   Date Additional Medicare IM given:   Additional Medicare IM given by:    Discharge Disposition:  [place]  Per UR Regulation:    If discussed at Long Length of Stay Meetings, dates discussed:    Comments:

## 2014-05-29 NOTE — Progress Notes (Addendum)
PROGRESS NOTE  [name] SLP:[id number] DOB: 1935-12-06 DOA: 05/27/2014 PCP: Dorian Heckle, MD  Assessment/Plan: . Wound dehisence:  -discontinue IV antibiotics and observed as the patient is afebrile without any leukocytosis and hemodynamically stable -Continue wound care per general surgery recommendations -Scant amount of yellow serous change was noted without any pus -Wound care recommendations per general surgery  . generalized weakness:  -multifactorial including deconditioning, volume depletion, cardiomyopathy, recent URI -Certainly, the patient may have an infectious process -Discontinue antibiotics and observe  . Persistent atrial fibrillation:  -continue digoxin and Coreg. Monitoring telemetry. -Presently rate controlled  Severe Protein Calorie Malnutrition -nutritional supplements  . Hyperkalemia: Likely secondary to recently being on Bactrim ( only completed 2-3 days back) and also is on oral potassium.  -given 1 dose of Kayexalate, -Improved  . Essential hypertension:  -Will hold candesartan given hyperkalemia.  -Continue Lasix and Coreg for now. -hold amlodipine due to soft BP  . Dyslipidemia: continue statin  . Cardiomyopathy, ischemic /Chronic systolic heart failure- EF 20-25% 2D 08/08/13: Clinically compensated -in fact may be on the slightly dry side.  -Lasix held yesterday today,  -resume today at the lower dose of 40 mg twice a day.  -Continue Coreg. Hold candesartan due to Hyperkalemia.  . Coronary atherosclerosis: Without chest pain, monitoring telemetry. Continue aspirin, statin and beta blocker  . Biventricular automatic implantable cardioverter defibrillator in situ: Monitoring telemetry  . Chronic respiratory failure:  -secondary to COPD. Continue home O -stable  . COPD:  -continue nebulized bronchodilators. Continue usual dosing of prednisone. -no wheeze on exam -CXR today as pt c/o sob with cough with  increased sputum  Deconditioning -Physical therapy evaluation--> home health PT was recommended  Family Communication: wife updated at bedside Disposition Plan: Home when cleared by general surgery            Procedures/Studies: Dg Chest Port 1 View  05/27/2014   CLINICAL DATA:  Weakness, short of breath  EXAM: PORTABLE CHEST - 1 VIEW  COMPARISON:  08/08/2013  FINDINGS: Left-sided pacemaker overlies stable enlarged cardiac silhouette. No effusion, infiltrate, pneumothorax.No acute cardiopulmonary process.  IMPRESSION: No acute cardiopulmonary process.   Electronically Signed   By: Suzy Bouchard M.D.   On: 05/27/2014 14:53         Subjective: Patient complaining of some shortness of breath with increasing coughing with green sputum. Denies fevers, chills, chest pain, nausea, vomiting, diarrhea, abdominal pain.  Objective: Filed Vitals:   05/29/14 0857 05/29/14 0911 05/29/14 0918 05/29/14 1211  BP: 129/49     Pulse: 86     Temp: 98 F (36.7 C)     TempSrc: Oral     Resp: 16     Height:      Weight:      SpO2: 99% 100% 100% 100%    Intake/Output Summary (Last 24 hours) at 05/29/14 1221 Last data filed at 05/29/14 1117  Gross per 24 hour  Intake    360 ml  Output   1450 ml  Net  -1090 ml   Weight change: -0.001 kg (-0 oz) Exam:   General:  Pt is alert, follows commands appropriately, not in acute distress  HEENT: No icterus, No thrush, No neck mass, [place]/AT  Cardiovascular: RRR, S1/S2, no rubs, no gallops  Respiratory: CTA bilaterally, no wheezing, no crackles, no rhonchi  Abdomen: Soft/+BS, non tender, non distended, no guarding  Extremities: No edema, No lymphangitis, No petechiae, No rashes, no  synovitis; sacral wound with yellow drainage without pus. No crepitance or lymphangitis. Mild surrounding erythema  Data Reviewed: Basic Metabolic Panel:  Recent Labs Lab 05/27/14 1430 05/27/14 1715 05/28/14 0449 05/29/14 0450  NA 136*  --  140  142  K 5.9* 4.8 4.4 3.8  CL 95*  --  101 101  CO2 27  --  26 28  GLUCOSE 149*  --  158* 119*  BUN 57*  --  48* 32*  CREATININE 1.10  --  0.97 0.93  CALCIUM 9.2  --  8.5 8.6   Liver Function Tests:  Recent Labs Lab 05/27/14 1430 05/29/14 0450  AST 32 23  ALT 27 29  ALKPHOS 66 68  BILITOT 0.3 0.3  PROT 7.3 6.0  ALBUMIN 2.9* 2.4*   No results for input(s): LIPASE, AMYLASE in the last 168 hours. No results for input(s): AMMONIA in the last 168 hours. CBC:  Recent Labs Lab 05/27/14 1517 05/28/14 0449 05/29/14 0450  WBC 5.4 5.3 5.1  NEUTROABS 3.7  --   --   HGB 9.3* 8.7* 8.4*  HCT 29.2* 28.1* 26.9*  MCV 93.9 94.6 94.7  PLT 218 220 210   Cardiac Enzymes: No results for input(s): CKTOTAL, CKMB, CKMBINDEX, TROPONINI in the last 168 hours. BNP: Invalid input(s): POCBNP CBG:  Recent Labs Lab 05/28/14 2004  GLUCAP 220*    Recent Results (from the past 240 hour(s))  Blood Culture (routine x 2)     Status: None (Preliminary result)   Collection Time: 05/27/14  2:30 PM  Result Value Ref Range Status   Specimen Description BLOOD RIGHT ARM  Final   Special Requests BOTTLES DRAWN AEROBIC AND ANAEROBIC 5CC  Final   Culture  Setup Time   Final    05/27/2014 21:42 Performed at [facility]    Culture   Final           BLOOD CULTURE RECEIVED NO GROWTH TO DATE CULTURE WILL BE HELD FOR 5 DAYS BEFORE ISSUING A FINAL NEGATIVE REPORT Performed at [facility]    Report Status PENDING  Incomplete  Culture, blood (routine x 2)     Status: None (Preliminary result)   Collection Time: 05/27/14  3:17 PM  Result Value Ref Range Status   Specimen Description BLOOD RIGHT HAND  Final   Special Requests BOTTLES DRAWN AEROBIC AND ANAEROBIC 10CC  Final   Culture  Setup Time   Final    05/28/2014 04:48 Performed at [facility]    Culture   Final           BLOOD CULTURE RECEIVED NO GROWTH TO DATE CULTURE WILL BE HELD FOR 5 DAYS BEFORE ISSUING A FINAL  NEGATIVE REPORT Performed at [facility]    Report Status PENDING  Incomplete  Urine culture     Status: None   Collection Time: 05/27/14  3:35 PM  Result Value Ref Range Status   Specimen Description URINE, CLEAN CATCH  Final   Special Requests NONE  Final   Culture  Setup Time   Final    05/28/2014 05:41 Performed at [place]   Final    2,000 COLONIES/ML Performed at [facility]    Culture   Final    INSIGNIFICANT GROWTH Performed at [facility]    Report Status 05/29/2014 FINAL  Final     Scheduled Meds: . albuterol  2.5 mg Nebulization QID  . antiseptic oral rinse  7 mL Mouth Rinse BID  . aspirin EC  325 mg Oral QHS  . budesonide  0.5 mg Nebulization BID  . carvedilol  25 mg Oral BID WC  . digoxin  0.125 mg Oral q morning - 10a  . ferrous sulfate  325 mg Oral QHS  . furosemide  40 mg Oral BID  . heparin  5,000 Units Subcutaneous 3 times per day  . nitroGLYCERIN  0.2 mg Transdermal Daily  . predniSONE  10 mg Oral Daily  . simvastatin  20 mg Oral QHS  . tiotropium  18 mcg Inhalation Daily   Continuous Infusions:    Dede Dobesh, DO  [facility] Pager [phone]  If 7PM-7AM, please contact night-coverage [email or web address] Password [email or web address] 05/29/2014, 12:21 PM   LOS: 2 days

## 2014-05-29 NOTE — Progress Notes (Signed)
[facility] Progress Note     Subjective: Pt concerned because he feels he's getting deconditioned.  He says he needs an air mattress.    Objective: Vital signs in last 24 hours: Temp:  [98.1 F (36.7 C)-98.3 F (36.8 C)] 98.3 F (36.8 C) (11/27 0435) Pulse Rate:  [65-75] 65 (11/27 0435) Resp:  [18] 18 (11/27 0435) BP: (112-114)/(52-55) 114/55 mmHg (11/27 0435) SpO2:  [99 %-100 %] 99 % (11/27 0557) Weight:  [134 lb 14.7 oz (61.199 kg)] 134 lb 14.7 oz (61.199 kg) (11/26 2000) Last BM Date: 05/28/14  Intake/Output from previous day: 11/26 0701 - 11/27 0700 In: 240 [P.O.:240] Out: 1650 [Urine:1650] Intake/Output this shift: Total I/O In: 120 [P.O.:120] Out: -   PE: Gen:  Alert, NAD, pleasant Sacrum:  Bone and subcutaneous tissue visible, loose suture approximation of skin, peri-wound tissue is macerated and erythematous.  Wound bed with slough present, no foul smell or significant drainage.   Lab Results:   Recent Labs  05/28/14 0449 05/29/14 0450  WBC 5.3 5.1  HGB 8.7* 8.4*  HCT 28.1* 26.9*  PLT 220 210   BMET  Recent Labs  05/28/14 0449 05/29/14 0450  NA 140 142  K 4.4 3.8  CL 101 101  CO2 26 28  GLUCOSE 158* 119*  BUN 48* 32*  CREATININE 0.97 0.93  CALCIUM 8.5 8.6   PT/INR No results for input(s): LABPROT, INR in the last 72 hours. CMP     Component Value Date/Time   NA 142 05/29/2014 0450   NA 139 02/06/2013 1127   K 3.8 05/29/2014 0450   K 4.3 02/06/2013 1127   CL 101 05/29/2014 0450   CO2 28 05/29/2014 0450   CO2 31* 02/06/2013 1127   GLUCOSE 119* 05/29/2014 0450   GLUCOSE 152* 02/06/2013 1127   BUN 32* 05/29/2014 0450   BUN 24.1 02/06/2013 1127   CREATININE 0.93 05/29/2014 0450   CREATININE 1.0 02/06/2013 1127   CALCIUM 8.6 05/29/2014 0450   CALCIUM 9.4 02/06/2013 1127   PROT 6.0 05/29/2014 0450   PROT 7.5 01/08/2013 1222   ALBUMIN 2.4* 05/29/2014 0450   ALBUMIN 3.5 01/08/2013 1222   AST 23 05/29/2014 0450   AST 15  01/08/2013 1222   ALT 29 05/29/2014 0450   ALT 15 01/08/2013 1222   ALKPHOS 68 05/29/2014 0450   ALKPHOS 76 01/08/2013 1222   BILITOT 0.3 05/29/2014 0450   BILITOT 0.47 01/08/2013 1222   GFRNONAA 80* 05/29/2014 0450   GFRAA >90 05/29/2014 0450   Lipase  No results found for: LIPASE     Studies/Results: Dg Chest Port 1 View  05/27/2014   CLINICAL DATA:  Weakness, short of breath  EXAM: PORTABLE CHEST - 1 VIEW  COMPARISON:  08/08/2013  FINDINGS: Left-sided pacemaker overlies stable enlarged cardiac silhouette. No effusion, infiltrate, pneumothorax.No acute cardiopulmonary process.  IMPRESSION: No acute cardiopulmonary process.   Electronically Signed   By: Suzy Bouchard M.D.   On: 05/27/2014 14:53    Anti-infectives: Anti-infectives    Start     Dose/Rate Route Frequency Ordered Stop   05/27/14 1900  piperacillin-tazobactam (ZOSYN) IVPB 3.375 g  Status:  Discontinued     3.375 g12.5 mL/hr over 240 Minutes Intravenous Every 8 hours 05/27/14 1842 05/28/14 1031   05/27/14 1900  vancomycin (VANCOCIN) 1,250 mg in sodium chloride 0.9 % 250 mL IVPB  Status:  Discontinued     1,250 mg166.7 mL/hr over 90 Minutes Intravenous Every 24 hours 05/27/14 1842 05/28/14 1031  Assessment/Plan POD #11 s/p excision and closure of decubitus ulcer (Dr. Hassell Done)  Plan: 1.  Consider removing all sutures and changing to WD dressings.  Will leave this up to Dr. Hassell Done 2.  Needs air overlay mattress 3.  PT/OT to help in mobilization and prevent deconditioning 4.  Frequent turning to avoid excessive pressures 5.  Continue dressing changes    LOS: 2 days    DORT, Jinny Blossom 05/29/2014, 8:59 AM Pager: [phone]

## 2014-05-29 NOTE — Plan of Care (Signed)
Problem: Phase I Progression Outcomes Goal: Voiding-avoid urinary catheter unless indicated Outcome: Not Applicable Date Met:  56/25/63

## 2014-05-29 NOTE — Evaluation (Signed)
Physical Therapy Evaluation Patient Details Name: [name] MRN: [id number] DOB: 14-Sep-1935 Today's Date: 05/29/2014   History of Present Illness  Pt is a 78 y.o. male with a PMH of COPD on home 02, chronic systolic heart failure, recently discharged from the Gen. Surgery service afterexcision in closure of the midline decubitus ulcer who presents today with the above noted complaint. Per patient, he is just not been feeling his usual self for the past 2-3 days PTA. Claims to have significant weakness, he claims it's a struggle walking around his house. He normally uses a cane and a walker to walk. He presented to the ED with these complaints, was found to have mild hyperkalemia, UA was consistent with UTI. During ED evaluation, it appeared that he had some pus in his sacral wound dressing, and appears to he may have had a wound dehiscence. Patient admitted for further evaluation and treatment.  Clinical Impression  Pt admitted with the above. Pt currently with functional limitations due to the deficits listed below (see PT Problem List). At the time of PT eval pt was able to perform transfers with min guard assist. Pt became SOB with seated MMT activity requiring a rest break. During this time O2 sats remained 96-97% with 2L/min supplemental O2 donned.   Pt will benefit from skilled PT to increase their independence and safety with mobility to allow discharge to the venue listed below.  Per pt, house is easily accessible once inside however must ascend 3 steps to enter his home.      Follow Up Recommendations Home health PT;Supervision/Assistance - 24 hour    Equipment Recommendations  None recommended by PT    Recommendations for Other Services       Precautions / Restrictions Precautions Precautions: Fall Restrictions Weight Bearing Restrictions: No      Mobility  Bed Mobility               General bed mobility comments: Pt sitting EOB when PT arrived.    Transfers Overall transfer level: Needs assistance Equipment used: Rolling walker (2 wheeled) Transfers: Stand Pivot Transfers;Sit to/from Stand Sit to Stand: Supervision Stand pivot transfers: Min guard       General transfer comment: Pt stood to RW and then pushed it aside to transition to recliner chair. Pt reaching for arm rests for support and did not maintain an upright posture during SPT. Close guard for safety during this time, however pt did not require any assistance.   Ambulation/Gait             General Gait Details: Pt SOB and required rest break with MMT and gait training was deferred.   Stairs            Wheelchair Mobility    Modified Rankin (Stroke Patients Only)       Balance Overall balance assessment: Needs assistance Sitting-balance support: Feet supported;No upper extremity supported Sitting balance-Leahy Scale: Good     Standing balance support: No upper extremity supported;During functional activity Standing balance-Leahy Scale: Poor Standing balance comment: Pt requires heavy UE support for dynamic balance and transfers.                              Pertinent Vitals/Pain Pain Assessment: 0-10 Pain Score: 0-No pain Pain Location: Low back Pain Intervention(s): Premedicated before session    [place] expects to be discharged to:: Private residence Living Arrangements: Spouse/significant other Available Help at  Discharge: Family;Available 24 hours/day Type of Home: House Home Access: Stairs to enter Entrance Stairs-Rails: Right;Left;Can reach both Entrance Stairs-Number of Steps: 3-5 depending on which door he goes in Home Layout: Two level;Able to live on main level with bedroom/bathroom Home Equipment: Gilford Rile - 2 wheels;Cane - single point;Wheelchair - manual;Shower seat      Prior Function Level of Independence: Independent with assistive device(s)         Comments: Pt states his wife  occasionally assists him but he was independent - likes for her to be there just in case when he is showering. States that he tries to exercise regularly with hand weights and walking outside.      Hand Dominance   Dominant Hand: Right    Extremity/Trunk Assessment   Upper Extremity Assessment: Defer to OT evaluation           Lower Extremity Assessment: Generalized weakness;LLE deficits/detail   LLE Deficits / Details: Decreased gross strength compared to RLE. Pt states "this leg has gone to nothing". Grossly, 3+ to 4-/5  Cervical / Trunk Assessment: Kyphotic  Communication   Communication: No difficulties  Cognition Arousal/Alertness: Awake/alert Behavior During Therapy: WFL for tasks assessed/performed Overall Cognitive Status: Within Functional Limits for tasks assessed                      General Comments      Exercises        Assessment/Plan    PT Assessment Patient needs continued PT services  PT Diagnosis Difficulty walking;Generalized weakness   PT Problem List Decreased strength;Decreased range of motion;Decreased balance;Decreased activity tolerance;Decreased mobility;Decreased knowledge of use of DME;Decreased safety awareness;Decreased knowledge of precautions;Cardiopulmonary status limiting activity  PT Treatment Interventions DME instruction;Gait training;Functional mobility training;Stair training;Therapeutic activities;Therapeutic exercise;Neuromuscular re-education;Patient/family education   PT Goals (Current goals can be found in the Care Plan section) Acute Rehab PT Goals Patient Stated Goal: Return home independently. PT Goal Formulation: With patient Time For Goal Achievement: 06/05/14 Potential to Achieve Goals: Good    Frequency Min 3X/week   Barriers to discharge        Co-evaluation               End of Session Equipment Utilized During Treatment: Gait belt;Oxygen Activity Tolerance: Patient limited by  fatigue Patient left: in chair;with chair alarm set;with call bell/phone within reach;with nursing/sitter in room Nurse Communication: Mobility status         Time: 0923-1002 PT Time Calculation (min) (ACUTE ONLY): 39 min   Charges:   PT Evaluation $Initial PT Evaluation Tier I: 1 Procedure PT Treatments $Gait Training: 8-22 mins $Therapeutic Activity: 8-22 mins   PT G Codes:          Rolinda Roan 05/29/2014, 10:25 AM  Rolinda Roan, PT, DPT Acute Rehabilitation Services Pager: [phone]

## 2014-05-29 NOTE — Discharge Summary (Signed)
Physician Discharge Summary  [name] ERX:[id number] DOB: [birth year] DOA: 05/27/2014  PCP: Dorian Heckle, MD  Admit date: 05/27/2014 Discharge date: 05/30/14 Recommendations for Outpatient Follow-up:  1. Pt will need to follow up with PCP in 2 weeks post discharge 2. Please obtain BMP and CBC   Discharge Diagnoses:  . Wound dehisence:  -discontinue IV antibiotics and observed as the patient is afebrile without any leukocytosis and hemodynamically stable -Continue wound care per general surgery recommendations -Scant amount of yellow serous change was noted without any pus -Wound care recommendations per general surgery  . generalized weakness:  -multifactorial including deconditioning, volume depletion, cardiomyopathy, recent URI -Certainly, the patient may have an infectious process -Discontinue antibiotics and observe  . Persistent atrial fibrillation:  -continue digoxin and Coreg. Monitoring telemetry. -Presently rate controlled  Severe Protein Calorie Malnutrition -nutritional supplements  . Hyperkalemia: Likely secondary to recently being on Bactrim ( only completed 2-3 days back) and also is on oral potassium.  -given 1 dose of Kayexalate, -Improved  . Essential hypertension:  -Will hold candesartan given hyperkalemia.  -Continue Lasix and Coreg for now. -hold amlodipine due to soft BP  . Dyslipidemia: continue statin  . Cardiomyopathy, ischemic /Chronic systolic heart failure- EF 20-25% 2D 08/08/13: Clinically compensated -in fact may be on the slightly dry side.  -Lasix held yesterday today,  -resume today at the lower dose of 40 mg twice a day.  -Continue Coreg. Hold candesartan due to Hyperkalemia.  . Coronary atherosclerosis: Without chest pain, monitoring telemetry. Continue aspirin, statin and beta blocker  . Biventricular automatic implantable cardioverter defibrillator in situ: Monitoring telemetry  . Chronic respiratory failure:   -secondary to COPD. Continue home O2--2L -stable  . COPD:  -continue nebulized bronchodilators. Continue usual dosing of prednisone. -no wheeze on exam -CXR today as pt c/o sob with cough with increased sputum  Deconditioning -Physical therapy evaluation--> home health PT was recommended  Discharge Condition: stable  Disposition: home  Diet:cardiac Wt Readings from Last 3 Encounters:  05/28/14 61.199 kg (134 lb 14.7 oz)  05/18/14 66.679 kg (147 lb)  05/15/14 66.679 kg (147 lb)    History of present illness:  78 y.o. male with a Past Medical History of COPD on home 02 (2L), chronic systolic heart failure , recently discharged from the Gen. Surgery service after Excision in closure of the midline decubitus ulcer (05/18/14) who presents with the above noted complaint. Per patient, he is just not been feeling his usual self for the past 2-3 days prior to admission. Claims to have significant weakness, he claims it's a struggle walking around his house. He normally uses a cane and a walker to walk. He currently is on doxycycline for a "MRSA" infection of his wound. He states that he has been on it for many months. He denies any fever or febrile illness. No nausea, vomiting or diarrhea with the past few days. He denies any abdominal pain. The patient was found to be volume depleted and hyperkalemic in the emergency department. He was given Kayexalate. There was also initial concern for infected wound on his sacral decubitus. General surgery was consulted. They did not feel that the sacral wound and was grossly infected. Although antibiotics were initially started, they were discontinued and the patient was observed off antibiotics. The patient did not have any leukocytosis, fever, or hemodynamic instability. Gen. surgery continued to follow the patient regarding his surgical wound. His sutures were removed and a dry dressing was placed onto the sacral wound. Gen.  surgery did not feel that the  patient needed any surgical debridement at this time.    Consultants: General surgery  Discharge Exam: Filed Vitals:   05/29/14 0857  BP: 129/49  Pulse: 86  Temp: 98 F (36.7 C)  Resp: 16   Filed Vitals:   05/29/14 0857 05/29/14 0911 05/29/14 0918 05/29/14 1211  BP: 129/49     Pulse: 86     Temp: 98 F (36.7 C)     TempSrc: Oral     Resp: 16     Height:      Weight:      SpO2: 99% 100% 100% 100%   General: A&O x 3, NAD, pleasant, cooperative Cardiovascular: RRR, no rub, no gallop, no S3 Respiratory: CTAB, no wheeze, no rhonchi Abdomen:soft, nontender, nondistended, positive bowel sounds Extremities: No edema, No lymphangitis, no petechiae  Discharge Instructions     Medication List    ASK your doctor about these medications        acetaminophen 325 MG tablet  Commonly known as:  TYLENOL  Take 2 tablets (650 mg total) by mouth every 4 (four) hours as needed for headache or mild pain.     albuterol (2.5 MG/3ML) 0.083% nebulizer solution  Commonly known as:  PROVENTIL  Take 2.5 mg by nebulization 4 (four) times daily.     albuterol 108 (90 BASE) MCG/ACT inhaler  Commonly known as:  PROVENTIL HFA;VENTOLIN HFA  Inhale 2 puffs into the lungs every 6 (six) hours as needed for wheezing or shortness of breath.     PROVENTIL HFA 108 (90 BASE) MCG/ACT inhaler  Generic drug:  albuterol  INHALE 1 TO 2 PUFFS INTO THE LUNGS EVERY 6 HOURS AS NEEDED FOR SHORTNESS OF BREATH     amLODipine 5 MG tablet  Commonly known as:  NORVASC  TAKE 1 TABLET DAILY     aspirin EC 325 MG tablet  Take 325 mg by mouth at bedtime.     budesonide 0.5 MG/2ML nebulizer solution  Commonly known as:  PULMICORT  Take 0.5 mg by nebulization 2 (two) times daily.     candesartan 32 MG tablet  Commonly known as:  ATACAND  Take 32 mg by mouth every morning.     carvedilol 25 MG tablet  Commonly known as:  COREG  Take 25 mg by mouth 2 (two) times daily with a meal.     doxycycline 100 MG  DR capsule  Commonly known as:  DORYX  Take 100 mg by mouth 2 (two) times daily.     ferrous sulfate 325 (65 FE) MG tablet  Take 325 mg by mouth at bedtime.     furosemide 40 MG tablet  Commonly known as:  LASIX  TAKE 2 TABLETS IN THE MORNING AND 1 TABLET IN THE EVENING     guaiFENesin 600 MG 12 hr tablet  Commonly known as:  MUCINEX  Take 600 mg by mouth 2 (two) times daily as needed. congestion     HYDROcodone-acetaminophen 2.5-500 MG per tablet  Commonly known as:  VICODIN  Take 1 tablet by mouth every 6 (six) hours as needed for pain.     KLOR-CON M20 20 MEQ tablet  Generic drug:  potassium chloride SA  TAKE 1 TABLET DAILY     digoxin 0.125 MG tablet  Commonly known as:  LANOXIN  Take 0.125 mg by mouth every morning.     LANOXIN 0.125 MG tablet  Generic drug:  digoxin  TAKE 1 TABLET DAILY  mupirocin ointment 2 %  Commonly known as:  BACTROBAN  Apply 1 application topically 2 (two) times daily as needed (Cuts and bruises).     naproxen sodium 220 MG tablet  Commonly known as:  ANAPROX  Take 220 mg by mouth 2 (two) times daily as needed (Pain).     nitroGLYCERIN 0.4 MG SL tablet  Commonly known as:  NITROSTAT  Place 0.4 mg under the tongue every 5 (five) minutes as needed for chest pain.     nitroGLYCERIN 0.2 mg/hr patch  Commonly known as:  NITRODUR - Dosed in mg/24 hr  Place 1 patch onto the skin daily.     predniSONE 10 MG tablet  Commonly known as:  DELTASONE  Take 10 mg by mouth daily.     PRESCRIPTION MEDICATION  Uses 1L-2L of home Oxygen     simvastatin 20 MG tablet  Commonly known as:  ZOCOR  Take 20 mg by mouth at bedtime.     sulfamethoxazole-trimethoprim 800-160 MG per tablet  Commonly known as:  BACTRIM DS,SEPTRA DS  Take 1 tablet by mouth 2 (two) times daily. 7 day course started 05-12-14     tiotropium 18 MCG inhalation capsule  Commonly known as:  SPIRIVA  Place 18 mcg into inhaler and inhale daily.         The results of  significant diagnostics from this hospitalization (including imaging, microbiology, ancillary and laboratory) are listed below for reference.    Significant Diagnostic Studies: Dg Chest Port 1 View  05/27/2014   CLINICAL DATA:  Weakness, short of breath  EXAM: PORTABLE CHEST - 1 VIEW  COMPARISON:  08/08/2013  FINDINGS: Left-sided pacemaker overlies stable enlarged cardiac silhouette. No effusion, infiltrate, pneumothorax.No acute cardiopulmonary process.  IMPRESSION: No acute cardiopulmonary process.   Electronically Signed   By: Suzy Bouchard M.D.   On: 05/27/2014 14:53     Microbiology: Recent Results (from the past 240 hour(s))  Blood Culture (routine x 2)     Status: None (Preliminary result)   Collection Time: 05/27/14  2:30 PM  Result Value Ref Range Status   Specimen Description BLOOD RIGHT ARM  Final   Special Requests BOTTLES DRAWN AEROBIC AND ANAEROBIC 5CC  Final   Culture  Setup Time   Final    05/27/2014 21:42 Performed at [facility]    Culture   Final           BLOOD CULTURE RECEIVED NO GROWTH TO DATE CULTURE WILL BE HELD FOR 5 DAYS BEFORE ISSUING A FINAL NEGATIVE REPORT Performed at [facility]    Report Status PENDING  Incomplete  Culture, blood (routine x 2)     Status: None (Preliminary result)   Collection Time: 05/27/14  3:17 PM  Result Value Ref Range Status   Specimen Description BLOOD RIGHT HAND  Final   Special Requests BOTTLES DRAWN AEROBIC AND ANAEROBIC 10CC  Final   Culture  Setup Time   Final    05/28/2014 04:48 Performed at [facility]    Culture   Final           BLOOD CULTURE RECEIVED NO GROWTH TO DATE CULTURE WILL BE HELD FOR 5 DAYS BEFORE ISSUING A FINAL NEGATIVE REPORT Performed at [facility]    Report Status PENDING  Incomplete  Urine culture     Status: None   Collection Time: 05/27/14  3:35 PM  Result Value Ref Range Status   Specimen Description URINE, CLEAN CATCH  Final  Special Requests NONE   Final   Culture  Setup Time   Final    05/28/2014 05:41 Performed at [place]   Final    2,000 COLONIES/ML Performed at [facility]    Culture   Final    INSIGNIFICANT GROWTH Performed at [facility]    Report Status 05/29/2014 FINAL  Final     Labs: Basic Metabolic Panel:  Recent Labs Lab 05/27/14 1430  05/28/14 0449 05/29/14 0450  NA 136*  --  140 142  K 5.9*  < > 4.4 3.8  CL 95*  --  101 101  CO2 27  --  26 28  GLUCOSE 149*  --  158* 119*  BUN 57*  --  48* 32*  CREATININE 1.10  --  0.97 0.93  CALCIUM 9.2  --  8.5 8.6  < > = values in this interval not displayed. Liver Function Tests:  Recent Labs Lab 05/27/14 1430 05/29/14 0450  AST 32 23  ALT 27 29  ALKPHOS 66 68  BILITOT 0.3 0.3  PROT 7.3 6.0  ALBUMIN 2.9* 2.4*   No results for input(s): LIPASE, AMYLASE in the last 168 hours. No results for input(s): AMMONIA in the last 168 hours. CBC:  Recent Labs Lab 05/27/14 1517 05/28/14 0449 05/29/14 0450  WBC 5.4 5.3 5.1  NEUTROABS 3.7  --   --   HGB 9.3* 8.7* 8.4*  HCT 29.2* 28.1* 26.9*  MCV 93.9 94.6 94.7  PLT 218 220 210   Cardiac Enzymes: No results for input(s): CKTOTAL, CKMB, CKMBINDEX, TROPONINI in the last 168 hours. BNP: Invalid input(s): POCBNP CBG:  Recent Labs Lab 05/28/14 2004  GLUCAP 220*    Time coordinating discharge:  Greater than 30 minutes  Signed:  Sutton Plake, DO [facility] Pager: [phone] 05/29/2014, 5:41 PM

## 2014-05-30 DIAGNOSIS — J9621 Acute and chronic respiratory failure with hypoxia: Secondary | ICD-10-CM

## 2014-05-30 DIAGNOSIS — J441 Chronic obstructive pulmonary disease with (acute) exacerbation: Secondary | ICD-10-CM

## 2014-05-30 DIAGNOSIS — E43 Unspecified severe protein-calorie malnutrition: Secondary | ICD-10-CM

## 2014-05-30 DIAGNOSIS — J9620 Acute and chronic respiratory failure, unspecified whether with hypoxia or hypercapnia: Secondary | ICD-10-CM

## 2014-05-30 LAB — BASIC METABOLIC PANEL
Anion gap: 11 (ref 5–15)
BUN: 31 mg/dL — AB (ref 6–23)
CO2: 30 mEq/L (ref 19–32)
CREATININE: 0.8 mg/dL (ref 0.50–1.35)
Calcium: 8.4 mg/dL (ref 8.4–10.5)
Chloride: 98 mEq/L (ref 96–112)
GFR calc Af Amer: >90 mL/min (ref >90)
GFR, EST NON AFRICAN AMERICAN: 85 mL/min — AB (ref >90)
GLUCOSE: 117 mg/dL — AB (ref 70–99)
Potassium: 3.9 mEq/L (ref 3.7–5.3)
Sodium: 139 mEq/L (ref 137–147)

## 2014-05-30 LAB — CORTISOL-AM, BLOOD: CORTISOL - AM: 3.9 ug/dL — AB (ref 4.3–22.4)

## 2014-05-30 LAB — CBC
HEMATOCRIT: 27.2 % — AB (ref 39.0–52.0)
Hemoglobin: 8.6 g/dL — ABNORMAL LOW (ref 13.0–17.0)
MCH: 30.3 pg (ref 26.0–34.0)
MCHC: 31.6 g/dL (ref 30.0–36.0)
MCV: 95.8 fL (ref 78.0–100.0)
Platelets: 220 10*3/uL (ref 150–400)
RBC: 2.84 MIL/uL — ABNORMAL LOW (ref 4.22–5.81)
RDW: 14.3 % (ref 11.5–15.5)
WBC: 5.1 10*3/uL (ref 4.0–10.5)

## 2014-05-30 LAB — CK: Total CK: 29 U/L (ref 7–232)

## 2014-05-30 MED ORDER — METHYLPREDNISOLONE SODIUM SUCC 125 MG IJ SOLR
125.0000 mg | Freq: Once | INTRAMUSCULAR | Status: AC
Start: 2014-05-30 — End: 2014-05-30
  Administered 2014-05-30: 125 mg via INTRAVENOUS
  Filled 2014-05-30: qty 2

## 2014-05-30 MED ORDER — METHYLPREDNISOLONE SODIUM SUCC 125 MG IJ SOLR
60.0000 mg | Freq: Four times a day (QID) | INTRAMUSCULAR | Status: DC
  Administered 2014-05-30 – 2014-06-01 (×8): 60 mg via INTRAVENOUS
  Filled 2014-05-30 (×2): qty 0.96
  Filled 2014-05-30: qty 2
  Filled 2014-05-30 (×5): qty 0.96
  Filled 2014-05-30: qty 2
  Filled 2014-05-30 (×4): qty 0.96

## 2014-05-30 NOTE — Progress Notes (Signed)
[facility] Progress Note     Subjective: Pt wants to go home.  Says pain in buttock is improved.    Objective: Vital signs in last 24 hours: Temp:  [97.8 F (36.6 C)-98.1 F (36.7 C)] 97.8 F (36.6 C) (11/28 0545) Pulse Rate:  [62-86] 67 (11/28 0545) Resp:  [16-18] 18 (11/28 0545) BP: (120-135)/(49-79) 135/79 mmHg (11/28 0545) SpO2:  [99 %-100 %] 100 % (11/28 0545) Weight:  [134 lb 14.7 oz (61.199 kg)] 134 lb 14.7 oz (61.199 kg) (11/27 2110) Last BM Date: 05/28/14  Intake/Output from previous day: 11/27 0701 - 11/28 0700 In: 720 [P.O.:720] Out: 875 [Urine:875] Intake/Output this shift:    PE: Gen:  Alert, NAD, pleasant Sacrum: Bone and subcutaneous tissue visible, loose suture approximation of skin, peri-wound tissue is macerated and erythematous. Wound bed with slough present, no foul smell or significant drainage.  Lab Results:   Recent Labs  05/29/14 0450 05/30/14 0318  WBC 5.1 5.1  HGB 8.4* 8.6*  HCT 26.9* 27.2*  PLT 210 220   BMET  Recent Labs  05/29/14 0450 05/30/14 0318  NA 142 139  K 3.8 3.9  CL 101 98  CO2 28 30  GLUCOSE 119* 117*  BUN 32* 31*  CREATININE 0.93 0.80  CALCIUM 8.6 8.4   PT/INR No results for input(s): LABPROT, INR in the last 72 hours. CMP     Component Value Date/Time   NA 139 05/30/2014 0318   NA 139 02/06/2013 1127   K 3.9 05/30/2014 0318   K 4.3 02/06/2013 1127   CL 98 05/30/2014 0318   CO2 30 05/30/2014 0318   CO2 31* 02/06/2013 1127   GLUCOSE 117* 05/30/2014 0318   GLUCOSE 152* 02/06/2013 1127   BUN 31* 05/30/2014 0318   BUN 24.1 02/06/2013 1127   CREATININE 0.80 05/30/2014 0318   CREATININE 1.0 02/06/2013 1127   CALCIUM 8.4 05/30/2014 0318   CALCIUM 9.4 02/06/2013 1127   PROT 6.0 05/29/2014 0450   PROT 7.5 01/08/2013 1222   ALBUMIN 2.4* 05/29/2014 0450   ALBUMIN 3.5 01/08/2013 1222   AST 23 05/29/2014 0450   AST 15 01/08/2013 1222   ALT 29 05/29/2014 0450   ALT 15 01/08/2013 1222   ALKPHOS 68 05/29/2014 0450   ALKPHOS 76 01/08/2013 1222   BILITOT 0.3 05/29/2014 0450   BILITOT 0.47 01/08/2013 1222   GFRNONAA 85* 05/30/2014 0318   GFRAA >90 05/30/2014 0318   Lipase  No results found for: LIPASE     Studies/Results: Dg Chest Port 1 View  05/29/2014   CLINICAL DATA:  Dyspnea, cough.  EXAM: PORTABLE CHEST - 1 VIEW  COMPARISON:  May 27, 2014.  FINDINGS: The heart size and mediastinal contours are within normal limits. Sternotomy wires are noted. Left-sided pacemaker is unchanged in position. No pneumothorax or pleural effusion is noted. Both lungs are clear. The visualized skeletal structures are unremarkable.  IMPRESSION: No acute cardiopulmonary abnormality seen.   Electronically Signed   By: Sabino Dick M.D.   On: 05/29/2014 19:33    Anti-infectives: Anti-infectives    Start     Dose/Rate Route Frequency Ordered Stop   05/27/14 1900  piperacillin-tazobactam (ZOSYN) IVPB 3.375 g  Status:  Discontinued     3.375 g12.5 mL/hr over 240 Minutes Intravenous Every 8 hours 05/27/14 1842 05/28/14 1031   05/27/14 1900  vancomycin (VANCOCIN) 1,250 mg in sodium chloride 0.9 % 250 mL IVPB  Status:  Discontinued     1,250 mg166.7 mL/hr over  90 Minutes Intravenous Every 24 hours 05/27/14 1842 05/28/14 1031       Assessment/Plan POD #12 s/p excision and closure of decubitus ulcer (Dr. Hassell Done)  Plan: 1. Remove sutures on POD #14 per Dr. Hassell Done  2. Continue air overlay mattress 3. PT/OT to help in mobilization and prevent deconditioning 4. Frequent turning to avoid excessive pressures 5. Continue dressing changes, may need wound bed packed and possibly santyl.  Will leave this up to Dr. Hassell Done. 6.  Possible d/c today once we have a plan for wound care.  May need to be seen at the wound center    LOS: 3 days    DORT, [facility] 05/30/2014, 8:19 AM Pager: [phone]

## 2014-05-30 NOTE — Progress Notes (Signed)
PROGRESS NOTE  [name] RKY:[id number] DOB: 01-Apr-1936 DOA: 05/27/2014 PCP: Dorian Heckle, MD  Assessment/Plan: Acute on chronic respiratory failure -Patient states that he has been more short of breath and c/o in cough with green sputum in the last 12 hours -Examination revealed diffuse wheezing, diminished breath sounds -Secondary to COPD exacerbation -Start intravenous steroids -05/29/2014 evening chest x-ray--negative pulmonary edema or consolidation -Continue aerosolized albuterol -Continue Pulmicort -Obtain EKG; cycle troponins  . Wound dehisence:  -discontinue IV antibiotics and observed as the patient is afebrile without any leukocytosis and hemodynamically stable -Continue wound care per general surgery recommendations -Scant amount of yellow serous change was noted without any pus -Wound care recommendations per general surgery  . generalized weakness:  -multifactorial including deconditioning, volume depletion, cardiomyopathy, recent URI -Certainly, the patient may have an infectious process -Discontinue antibiotics and observe  . Persistent atrial fibrillation:  -continue digoxin and Coreg. Monitoring telemetry. -Presently rate controlled  Severe Protein Calorie Malnutrition -nutritional supplements  . Hyperkalemia: Likely secondary to recently being on Bactrim ( only completed 2-3 days back) and also is on oral potassium.  -given 1 dose of Kayexalate, -Improved  . Essential hypertension:  -Will hold candesartan given hyperkalemia.  -Continue Lasix and Coreg for now. -hold amlodipine due to soft BP  . Dyslipidemia: continue statin  . Cardiomyopathy, ischemic /Chronic systolic heart failure- EF 20-25% 2D 08/08/13: Clinically compensated -in fact may be on the slightly dry side.  -Lasix held yesterday today,  -resume today at the lower dose of 40 mg twice a day.  -Continue Coreg. Hold candesartan due to Hyperkalemia and soft BP  .  Coronary atherosclerosis: Without chest pain, monitoring telemetry. Continue aspirin, statin and beta blocker  . Biventricular automatic implantable cardioverter defibrillator in situ: Monitoring telemetry  . Chronic respiratory failure:  -secondary to COPD. Continue home O2--2L -stable  Deconditioning -Physical therapy evaluation--> home health PT was recommended     Family Communication:   Pt at beside Disposition Plan:   Home when medically stable       Procedures/Studies: Dg Chest Port 1 View  05/29/2014   CLINICAL DATA:  Dyspnea, cough.  EXAM: PORTABLE CHEST - 1 VIEW  COMPARISON:  May 27, 2014.  FINDINGS: The heart size and mediastinal contours are within normal limits. Sternotomy wires are noted. Left-sided pacemaker is unchanged in position. No pneumothorax or pleural effusion is noted. Both lungs are clear. The visualized skeletal structures are unremarkable.  IMPRESSION: No acute cardiopulmonary abnormality seen.   Electronically Signed   By: Sabino Dick M.D.   On: 05/29/2014 19:33   Dg Chest Port 1 View  05/27/2014   CLINICAL DATA:  Weakness, short of breath  EXAM: PORTABLE CHEST - 1 VIEW  COMPARISON:  08/08/2013  FINDINGS: Left-sided pacemaker overlies stable enlarged cardiac silhouette. No effusion, infiltrate, pneumothorax.No acute cardiopulmonary process.  IMPRESSION: No acute cardiopulmonary process.   Electronically Signed   By: Suzy Bouchard M.D.   On: 05/27/2014 14:53         Subjective: Patient complains of increasing shortness breath, cough with green sputum in the last 12 hours. He has some chest discomfort. Denies any fevers, chills, nausea, vomiting, diarrhea, Abd pain  Objective: Filed Vitals:   05/29/14 2110 05/29/14 2131 05/30/14 0545 05/30/14 0915  BP: 120/55  135/79 109/59  Pulse: 62  67 83  Temp: 98.1 F (36.7 C)  97.8 F (36.6 C) 97.8 F (36.6 C)  TempSrc: Oral  Oral Oral  Resp: 16  18 19   Height:      Weight: 61.199 kg (134  lb 14.7 oz)     SpO2: 100% 99% 100% 98%    Intake/Output Summary (Last 24 hours) at 05/30/14 1134 Last data filed at 05/30/14 0001  Gross per 24 hour  Intake    600 ml  Output    875 ml  Net   -275 ml   Weight change: 0 kg (0 lb) Exam:   General:  Pt is alert, follows commands appropriately, not in acute distress  HEENT: No icterus, No thrush,[place]/AT  Cardiovascular: RRR, S1/S2, no rubs, no gallops  Respiratory: Bilateral wheeze. Diminished breath sounds bilateral.  Abdomen: Soft/+BS, non tender, non distended, no guarding  Extremities: No edema, No lymphangitis, No petechiae, No rashes, no synovitis  Data Reviewed: Basic Metabolic Panel:  Recent Labs Lab 05/27/14 1430 05/27/14 1715 05/28/14 0449 05/29/14 0450 05/30/14 0318  NA 136*  --  140 142 139  K 5.9* 4.8 4.4 3.8 3.9  CL 95*  --  101 101 98  CO2 27  --  26 28 30   GLUCOSE 149*  --  158* 119* 117*  BUN 57*  --  48* 32* 31*  CREATININE 1.10  --  0.97 0.93 0.80  CALCIUM 9.2  --  8.5 8.6 8.4   Liver Function Tests:  Recent Labs Lab 05/27/14 1430 05/29/14 0450  AST 32 23  ALT 27 29  ALKPHOS 66 68  BILITOT 0.3 0.3  PROT 7.3 6.0  ALBUMIN 2.9* 2.4*   No results for input(s): LIPASE, AMYLASE in the last 168 hours. No results for input(s): AMMONIA in the last 168 hours. CBC:  Recent Labs Lab 05/27/14 1517 05/28/14 0449 05/29/14 0450 05/30/14 0318  WBC 5.4 5.3 5.1 5.1  NEUTROABS 3.7  --   --   --   HGB 9.3* 8.7* 8.4* 8.6*  HCT 29.2* 28.1* 26.9* 27.2*  MCV 93.9 94.6 94.7 95.8  PLT 218 220 210 220   Cardiac Enzymes:  Recent Labs Lab 05/30/14 0318  CKTOTAL 29   BNP: Invalid input(s): POCBNP CBG:  Recent Labs Lab 05/28/14 2004  GLUCAP 220*    Recent Results (from the past 240 hour(s))  Blood Culture (routine x 2)     Status: None (Preliminary result)   Collection Time: 05/27/14  2:30 PM  Result Value Ref Range Status   Specimen Description BLOOD RIGHT ARM  Final   Special Requests  BOTTLES DRAWN AEROBIC AND ANAEROBIC 5CC  Final   Culture  Setup Time   Final    05/27/2014 21:42 Performed at [facility]    Culture   Final           BLOOD CULTURE RECEIVED NO GROWTH TO DATE CULTURE WILL BE HELD FOR 5 DAYS BEFORE ISSUING A FINAL NEGATIVE REPORT Performed at [facility]    Report Status PENDING  Incomplete  Culture, blood (routine x 2)     Status: None (Preliminary result)   Collection Time: 05/27/14  3:17 PM  Result Value Ref Range Status   Specimen Description BLOOD RIGHT HAND  Final   Special Requests BOTTLES DRAWN AEROBIC AND ANAEROBIC 10CC  Final   Culture  Setup Time   Final    05/28/2014 04:48 Performed at [facility]    Culture   Final           BLOOD CULTURE RECEIVED NO GROWTH TO DATE CULTURE WILL BE HELD FOR  5 DAYS BEFORE ISSUING A FINAL NEGATIVE REPORT Performed at [facility]    Report Status PENDING  Incomplete  Urine culture     Status: None   Collection Time: 05/27/14  3:35 PM  Result Value Ref Range Status   Specimen Description URINE, CLEAN CATCH  Final   Special Requests NONE  Final   Culture  Setup Time   Final    05/28/2014 05:41 Performed at [facility]   Final    2,000 COLONIES/ML Performed at [facility]    Culture   Final    INSIGNIFICANT GROWTH Performed at [facility]    Report Status 05/29/2014 FINAL  Final     Scheduled Meds: . albuterol  2.5 mg Nebulization QID  . antiseptic oral rinse  7 mL Mouth Rinse BID  . aspirin EC  325 mg Oral QHS  . budesonide  0.5 mg Nebulization BID  . carvedilol  25 mg Oral BID WC  . digoxin  0.125 mg Oral q morning - 10a  . feeding supplement (ENSURE COMPLETE)  237 mL Oral BID BM  . ferrous sulfate  325 mg Oral QHS  . furosemide  40 mg Oral BID  . heparin  5,000 Units Subcutaneous 3 times per day  . methylPREDNISolone (SOLU-MEDROL) injection  125 mg Intravenous Once  . methylPREDNISolone (SOLU-MEDROL)  injection  60 mg Intravenous Q6H  . nitroGLYCERIN  0.2 mg Transdermal Daily  . simvastatin  20 mg Oral QHS  . tiotropium  18 mcg Inhalation Daily   Continuous Infusions:    TAT, DAVID, DO  [facility] Pager [phone]  If 7PM-7AM, please contact night-coverage [email or web address] Password [email or web address] 05/30/2014, 11:34 AM   LOS: 3 days

## 2014-05-30 NOTE — Plan of Care (Signed)
Problem: Phase II Progression Outcomes Goal: IV changed to normal saline lock Outcome: Completed/Met Date Met:  05/30/14     

## 2014-05-30 NOTE — Progress Notes (Signed)
Physical Therapy Treatment Patient Details Name: [name] MRN: [id number] DOB: [birth year] Today's Date: 05/30/2014    History of Present Illness Pt is a 78 y.o. male with a PMH of COPD on home 02, chronic systolic heart failure, recently discharged from the Gen. Surgery service afterexcision in closure of the midline decubitus ulcer who presents today with the above noted complaint. Per patient, he is just not been feeling his usual self for the past 2-3 days PTA. Claims to have significant weakness, he claims it's a struggle walking around his house. He normally uses a cane and a walker to walk. He presented to the ED with these complaints, was found to have mild hyperkalemia, UA was consistent with UTI. During ED evaluation, it appeared that he had some pus in his sacral wound dressing, and appears to he may have had a wound dehiscence. Patient admitted for further evaluation and treatment.    PT Comments    Pt making steady progress.  Follow Up Recommendations  Home health PT;Supervision/Assistance - 24 hour     Equipment Recommendations  None recommended by PT    Recommendations for Other Services       Precautions / Restrictions Precautions Precautions: Fall    Mobility  Bed Mobility               General bed mobility comments: Pt sitting EOB when PT arrived.   Transfers Overall transfer level: Needs assistance Equipment used: Rolling walker (2 wheeled) Transfers: Sit to/from Stand Sit to Stand: Supervision            Ambulation/Gait Ambulation/Gait assistance: Min guard Ambulation Distance (Feet): 60 Feet Assistive device: Rolling walker (2 wheeled) Gait Pattern/deviations: Step-through pattern;Decreased stride length     General Gait Details: Verbal cues to stand more erect.   Stairs            Wheelchair Mobility    Modified Rankin (Stroke Patients Only)       Balance   Sitting-balance support: No upper extremity  supported Sitting balance-Leahy Scale: Good       Standing balance-Leahy Scale: Poor Standing balance comment: support of walker                    Cognition Arousal/Alertness: Awake/alert Behavior During Therapy: WFL for tasks assessed/performed Overall Cognitive Status: Within Functional Limits for tasks assessed                      Exercises      General Comments        Pertinent Vitals/Pain      Home Living                      Prior Function            PT Goals (current goals can now be found in the care plan section) Progress towards PT goals: Progressing toward goals    Frequency  Min 3X/week    PT Plan Current plan remains appropriate    Co-evaluation             End of Session Equipment Utilized During Treatment: Oxygen Activity Tolerance: Patient limited by fatigue Patient left: in chair;with chair alarm set;with call bell/phone within reach     Time: 1441-1500 PT Time Calculation (min) (ACUTE ONLY): 19 min  Charges:  $Gait Training: 8-22 mins  G Codes:      [name] 05/30/2014, 3:10 PM  [name]

## 2014-05-31 LAB — URINE CULTURE
COLONY COUNT: NO GROWTH
Culture: NO GROWTH

## 2014-05-31 MED ORDER — IRBESARTAN 75 MG PO TABS
75.0000 mg | ORAL_TABLET | Freq: Every day | ORAL | Status: DC
  Administered 2014-05-31 – 2014-06-02 (×3): 75 mg via ORAL
  Filled 2014-05-31 (×3): qty 1

## 2014-05-31 MED ORDER — COLLAGENASE 250 UNIT/GM EX OINT
TOPICAL_OINTMENT | Freq: Two times a day (BID) | CUTANEOUS | Status: DC
  Administered 2014-05-31 – 2014-06-01 (×4): via TOPICAL
  Filled 2014-05-31: qty 30

## 2014-05-31 NOTE — Progress Notes (Signed)
[facility] Progress Note     Subjective: Buttock feels better per patient.  He admits to trouble breathing and needing oxygen.  Doesn't want to go home now.  Objective: Vital signs in last 24 hours: Temp:  [97.7 F (36.5 C)-98.2 F (36.8 C)] 98.2 F (36.8 C) (11/29 0950) Pulse Rate:  [79-96] 96 (11/29 0950) Resp:  [18-22] 18 (11/29 0950) BP: (116-126)/(53-74) 126/74 mmHg (11/29 0950) SpO2:  [95 %-100 %] 100 % (11/29 0950) Weight:  [140 lb (63.504 kg)] 140 lb (63.504 kg) (11/28 2102) Last BM Date: 05/28/14  Intake/Output from previous day: 11/28 0701 - 11/29 0700 In: 1157 [P.O.:1157] Out: 1350 [Urine:1350] Intake/Output this shift: Total I/O In: -  Out: 350 [Urine:350]  PE: Gen:  Alert, NAD, pleasant Sacrum: Bone and subcutaneous tissue visible, sutures removed (except deep), peri-wound tissue is macerated and erythematous (more mild today). Wound bed with 100% yellow slough present, no foul smell or significant drainage.  Lab Results:   Recent Labs  05/29/14 0450 05/30/14 0318  WBC 5.1 5.1  HGB 8.4* 8.6*  HCT 26.9* 27.2*  PLT 210 220   BMET  Recent Labs  05/29/14 0450 05/30/14 0318  NA 142 139  K 3.8 3.9  CL 101 98  CO2 28 30  GLUCOSE 119* 117*  BUN 32* 31*  CREATININE 0.93 0.80  CALCIUM 8.6 8.4   PT/INR No results for input(s): LABPROT, INR in the last 72 hours. CMP     Component Value Date/Time   NA 139 05/30/2014 0318   NA 139 02/06/2013 1127   K 3.9 05/30/2014 0318   K 4.3 02/06/2013 1127   CL 98 05/30/2014 0318   CO2 30 05/30/2014 0318   CO2 31* 02/06/2013 1127   GLUCOSE 117* 05/30/2014 0318   GLUCOSE 152* 02/06/2013 1127   BUN 31* 05/30/2014 0318   BUN 24.1 02/06/2013 1127   CREATININE 0.80 05/30/2014 0318   CREATININE 1.0 02/06/2013 1127   CALCIUM 8.4 05/30/2014 0318   CALCIUM 9.4 02/06/2013 1127   PROT 6.0 05/29/2014 0450   PROT 7.5 01/08/2013 1222   ALBUMIN 2.4* 05/29/2014 0450   ALBUMIN 3.5 01/08/2013 1222   AST 23 05/29/2014 0450   AST 15 01/08/2013 1222   ALT 29 05/29/2014 0450   ALT 15 01/08/2013 1222   ALKPHOS 68 05/29/2014 0450   ALKPHOS 76 01/08/2013 1222   BILITOT 0.3 05/29/2014 0450   BILITOT 0.47 01/08/2013 1222   GFRNONAA 85* 05/30/2014 0318   GFRAA >90 05/30/2014 0318   Lipase  No results found for: LIPASE     Studies/Results: Dg Chest Port 1 View  05/29/2014   CLINICAL DATA:  Dyspnea, cough.  EXAM: PORTABLE CHEST - 1 VIEW  COMPARISON:  May 27, 2014.  FINDINGS: The heart size and mediastinal contours are within normal limits. Sternotomy wires are noted. Left-sided pacemaker is unchanged in position. No pneumothorax or pleural effusion is noted. Both lungs are clear. The visualized skeletal structures are unremarkable.  IMPRESSION: No acute cardiopulmonary abnormality seen.   Electronically Signed   By: Sabino Dick M.D.   On: 05/29/2014 19:33    Anti-infectives: Anti-infectives    Start     Dose/Rate Route Frequency Ordered Stop   05/27/14 1900  piperacillin-tazobactam (ZOSYN) IVPB 3.375 g  Status:  Discontinued     3.375 g12.5 mL/hr over 240 Minutes Intravenous Every 8 hours 05/27/14 1842 05/28/14 1031   05/27/14 1900  vancomycin (VANCOCIN) 1,250 mg in sodium chloride 0.9 % 250 mL  IVPB  Status:  Discontinued     1,250 mg166.7 mL/hr over 90 Minutes Intravenous Every 24 hours 05/27/14 1842 05/28/14 1031       Assessment/Plan POD #13 s/p excision and closure of decubitus ulcer (Dr. Hassell Done)  Plan: 1. Sutures removed yesterday 2. Continue air overlay mattress 3. PT/OT to help in mobilization and prevent deconditioning, will start PT's hydrotherapy to help with the wound bed (while here) 4. Frequent turning to avoid excessive pressures 5. Dressing changes to sacral wound BID - clean wound, then apply santyl, then pack with WD dressing, cover with sacral mepilex 6. D/c when medically stable.  Recommend wound center follow up after discharge and routine post-op  appointment with Dr. Hassell Done    LOS: 4 days    DORT, [facility] 05/31/2014, 10:18 AM Pager: [phone]

## 2014-05-31 NOTE — Evaluation (Signed)
Occupational Therapy Evaluation Patient Details Name: [name] MRN: [id number] DOB: August 08, 1935 Today's Date: 05/31/2014    History of Present Illness Pt is a 78 y.o. male with a PMH of COPD on home 02, chronic systolic heart failure, recently discharged from the Gen. Surgery service afterexcision in closure of the midline decubitus ulcer who presents today with the above noted complaint. Per patient, he is just not been feeling his usual self for the past 2-3 days PTA. Claims to have significant weakness, he claims it's a struggle walking around his house. He normally uses a cane and a walker to walk. He presented to the ED with these complaints, was found to have mild hyperkalemia, UA was consistent with UTI. During ED evaluation, it appeared that he had some pus in his sacral wound dressing, and appears to he may have had a wound dehiscence. Patient admitted for further evaluation and treatment.   Clinical Impression   Pt admitted with wound infection. Pt currently with functional limitations due to the deficits listed below (see OT Problem List).  Pt will benefit from skilled OT to increase their safety and independence with ADL and functional mobility for ADL to facilitate discharge to venue listed below.     Follow Up Recommendations  Home health OT;Supervision/Assistance - 24 hour;SNF;Other (comment) (depending on progress)    Equipment Recommendations  None recommended by OT       Precautions / Restrictions Precautions Precautions: Fall      Mobility Bed Mobility Overal bed mobility: Needs Assistance Bed Mobility: Supine to Sit     Supine to sit: Mod assist        Transfers Overall transfer level: Needs assistance Equipment used: 1 person hand held assist Transfers: Sit to/from Stand Sit to Stand: Min assist Stand pivot transfers: Min assist       General transfer comment: verbal cues for hand placement and to not sit in chair quickly.  Pt did agree to stay  in chair to eat lunch and have a breathing treatment    Balance Overall balance assessment: Needs assistance Sitting-balance support: No upper extremity supported Sitting balance-Leahy Scale: Good                                      ADL Overall ADL's : Needs assistance/impaired Eating/Feeding: Set up;Sitting   Grooming: Set up;Sitting   Upper Body Bathing: Minimal assitance;Sitting   Lower Body Bathing: Moderate assistance;Sit to/from stand   Upper Body Dressing : Minimal assistance;Sitting   Lower Body Dressing: Maximal assistance;Sit to/from stand       Toileting- Water quality scientist and Hygiene: Minimal assistance;Sit to/from stand       Functional mobility during ADLs: Minimal assistance General ADL Comments: pt fatigues easily     Vision                            Pertinent Vitals/Pain Pain Assessment: No/denies pain     Hand Dominance Right   Extremity/Trunk Assessment Upper Extremity Assessment Upper Extremity Assessment: Generalized weakness           Communication Communication Communication: No difficulties   Cognition Arousal/Alertness: Awake/alert Behavior During Therapy: WFL for tasks assessed/performed Overall Cognitive Status: Within Functional Limits for tasks assessed                     General Comments  wife present. Pt states he does not want to go home being this weak. OT explained options of ST SNF or HH follow therapy            Home Living Family/patient expects to be discharged to:: Private residence Living Arrangements: Spouse/significant other Available Help at Discharge: Family;Available 24 hours/day Type of Home: House Home Access: Stairs to enter [employer] of Steps: 3-5 depending on which door he goes in Entrance Stairs-Rails: Right;Left;Can reach both Home Layout: Two level;Able to live on main level with bedroom/bathroom     Bathroom Shower/Tub: Walk-in  shower;Door   [employer] Toilet: Standard     Home Equipment: Environmental consultant - 2 wheels;Cane - single point;Wheelchair - manual;Shower seat          Prior Functioning/Environment Level of Independence: Independent with assistive device(s)        Comments: Pt states his wife occasionally assists him but he was independent - likes for her to be there just in case when he is showering. States that he tries to exercise regularly with hand weights and walking outside.     OT Diagnosis: Generalized weakness   OT Problem List: Decreased strength;Decreased activity tolerance   OT Treatment/Interventions: Self-care/ADL training;DME and/or AE instruction;Patient/family education;Energy conservation    OT Goals(Current goals can be found in the care plan section) Acute Rehab OT Goals Patient Stated Goal: Return home independently. OT Goal Formulation: With patient Time For Goal Achievement: 06/14/14 Potential to Achieve Goals: Good ADL Goals Pt Will Perform Grooming: with supervision;standing Pt Will Transfer to Toilet: with supervision;regular height toilet;ambulating Pt Will Perform Toileting - Clothing Manipulation and hygiene: with supervision;sit to/from stand  OT Frequency: Min 2X/week   Barriers to D/C:    pt worried about being a burden to wife       Co-evaluation              End of Session Nurse Communication: Mobility status  Activity Tolerance: Patient limited by fatigue Patient left: in chair;with call bell/phone within reach;with family/visitor present;Other (comment) (wife will let nurse know when she leaves )   Time: 1204-1225 OT Time Calculation (min): 21 min Charges:  OT General Charges $OT Visit: 1 Procedure OT Evaluation $Initial OT Evaluation Tier I: 1 Procedure OT Treatments $Self Care/Home Management : 8-22 mins G-Codes:    Betsy Pries 19-Jun-2014, 12:41 PM

## 2014-05-31 NOTE — Plan of Care (Signed)
Problem: Consults Goal: Skin Care Protocol Initiated - if Braden Score 18 or less If consults are not indicated, leave blank or document N/A  Outcome: Completed/Met Date Met:  05/31/14  Problem: Phase I Progression Outcomes Goal: Hemodynamically stable Outcome: Completed/Met Date Met:  05/31/14

## 2014-05-31 NOTE — Progress Notes (Addendum)
PROGRESS NOTE  [name] EZM:[id number] DOB: [birth year] DOA: 05/27/2014 PCP: Dorian Heckle, MD   Brief history 78 y.o. male with a Past Medical History of COPD on home 60 (2L), chronic systolic heart failure , recently discharged from the Gen. Surgery service after Excision in closure of the midline decubitus ulcer (05/18/14) who presents with generalized weakness. Per patient, he is just not been feeling his usual self for the past 2-3 days prior to admission. Claims to have significant weakness, he claims it's a struggle walking around his house. He normally uses a cane and a walker to walk. He currently is on doxycycline for a "MRSA" infection of his wound.   He has been on the antibiotic for several months continuously.  He denies any fever or febrile illness. No nausea, vomiting or diarrhea with the past few days. He denies any abdominal pain. The patient was found to be volume depleted and hyperkalemic in the emergency department. He was given Kayexalate. There was also initial concern for infected wound on his sacral decubitus. General surgery was consulted. They did not feel that the sacral wound and was grossly infected. Although antibiotics were initially started, they were discontinued and the patient was observed off antibiotics. The patient did not have any leukocytosis, fever, or hemodynamic instability. Gen. surgery continued to follow the patient regarding his surgical wound. His sutures were removed and they recommended PT hydrotherapy while the patient was in the hospital. Gen. surgery did not feel that the patient needed any surgical debridement at this time. unfortunately, the patient developed acute on chronic respiratory failure with increasing shortness of breath on 05/30/2014. This was thought to be due to COPD exacerbation. The patient was started on intravenous Solu-Medrol with clinical improvement.    Assessment/Plan: Acute on chronic respiratory  failure -05/30/14- more short of breath and c/o in cough with green sputum in the last 12 hours -Less wheezing, moving better air since start of IV Solu-Medrol -Secondary to COPD exacerbation -continueintravenous steroids -05/29/2014 evening chest x-ray--negative pulmonary edema or consolidation -Continue aerosolized albuterol -Continue Pulmicort COPD exacerbation  -Slowly improving after starting IV Solu-Medrol -Pulmonary hygiene  -Continue supplemental oxygen -Clinically stable 2 L  . Wound dehisence:  -discontinue IV antibiotics and observed as the patient is afebrile without any leukocytosis and hemodynamically stable  -remains clinically stable off antibiotics  -Continue wound care per general surgery recommendations -Hydrotherapy while in the hospital -Wound care recommendations per general surgery  . generalized weakness:  -multifactorial including deconditioning, volume depletion, cardiomyopathy, recent URI -Certainly, the patient may have an infectious process -Discontinue antibiotics and observe  . Persistent atrial fibrillation:  -continue digoxin and Coreg. Monitoring telemetry. -Presently rate controlled  Severe Protein Calorie Malnutrition -nutritional supplements  . Hyperkalemia: Likely secondary to recently being on Bactrim ( only completed 2-3 days back) and also is on oral potassium.  -given 1 dose of Kayexalate, -Improved  . Essential hypertension:  -Will hold candesartan given hyperkalemia.  -Restart ARB lower dose as renal function is stabilized and hyperkalemia resolved -Continue Lasix and Coreg for now. -hold amlodipine due to soft BP  . Dyslipidemia: continue statin  . Cardiomyopathy, ischemic /Chronic systolic heart failure- EF 20-25% 2D 08/08/13: Clinically compensated -in fact may be on the slightly dry side.  -Lasixinitially held at time of admission -resumed after 24 hours at the lower dose of 40 mg twice a day.  -Continue Coreg.  Hold candesartan due to Hyperkalemia and soft  BP  . Coronary atherosclerosis: Without chest pain, monitoring telemetry. Continue aspirin, statin and beta blocker  . Biventricular automatic implantable cardioverter defibrillator in situ: Monitoring telemetry  . Chronic respiratory failure:  -secondary to COPD. Continue home O2--2L -stable  Deconditioning -Physical therapy evaluation--> home health PT was recommended   Family Communication:   Wife updated at beside Disposition Plan:   Home when medically stable       Procedures/Studies: Dg Chest Port 1 View  05/29/2014   CLINICAL DATA:  Dyspnea, cough.  EXAM: PORTABLE CHEST - 1 VIEW  COMPARISON:  May 27, 2014.  FINDINGS: The heart size and mediastinal contours are within normal limits. Sternotomy wires are noted. Left-sided pacemaker is unchanged in position. No pneumothorax or pleural effusion is noted. Both lungs are clear. The visualized skeletal structures are unremarkable.  IMPRESSION: No acute cardiopulmonary abnormality seen.   Electronically Signed   By: Sabino Dick M.D.   On: 05/29/2014 19:33   Dg Chest Port 1 View  05/27/2014   CLINICAL DATA:  Weakness, short of breath  EXAM: PORTABLE CHEST - 1 VIEW  COMPARISON:  08/08/2013  FINDINGS: Left-sided pacemaker overlies stable enlarged cardiac silhouette. No effusion, infiltrate, pneumothorax.No acute cardiopulmonary process.  IMPRESSION: No acute cardiopulmonary process.   Electronically Signed   By: Suzy Bouchard M.D.   On: 05/27/2014 14:53         Subjective: Patient is breathing somewhat better than yesterday, but is still having some dyspnea. He has a nonproductive cough. Denies any fevers, chills, chest pain, nausea, vomiting, diarrhea, abdominal pain.   Objective: Filed Vitals:   05/31/14 0437 05/31/14 0616 05/31/14 0910 05/31/14 0950  BP:  122/63  126/74  Pulse:  83  96  Temp:  98 F (36.7 C)  98.2 F (36.8 C)  TempSrc:    Oral  Resp:  19  18   Height:      Weight:      SpO2: 98% 97% 95% 100%    Intake/Output Summary (Last 24 hours) at 05/31/14 1253 Last data filed at 05/31/14 0900  Gross per 24 hour  Intake    800 ml  Output   1700 ml  Net   -900 ml   Weight change: 2.305 kg (5 lb 1.3 oz) Exam:   General:  Pt is alert, follows commands appropriately, not in acute distress  HEENT: No icterus, No thrush,  /AT  Cardiovascular: RRR, S1/S2, no rubs, no gallops  Respiratory: Bibasilar wheezing. Scattered bilateral rales. Good air movement   Abdomen: Soft/+BS, non tender, non distended, no guarding  Extremities: No edema, No lymphangitis, No petechiae, No rashes, no synovitis  Data Reviewed: Basic Metabolic Panel:  Recent Labs Lab 05/27/14 1430 05/27/14 1715 05/28/14 0449 05/29/14 0450 05/30/14 0318  NA 136*  --  140 142 139  K 5.9* 4.8 4.4 3.8 3.9  CL 95*  --  101 101 98  CO2 27  --  26 28 30   GLUCOSE 149*  --  158* 119* 117*  BUN 57*  --  48* 32* 31*  CREATININE 1.10  --  0.97 0.93 0.80  CALCIUM 9.2  --  8.5 8.6 8.4   Liver Function Tests:  Recent Labs Lab 05/27/14 1430 05/29/14 0450  AST 32 23  ALT 27 29  ALKPHOS 66 68  BILITOT 0.3 0.3  PROT 7.3 6.0  ALBUMIN 2.9* 2.4*   No results for input(s): LIPASE, AMYLASE in the last 168 hours. No results for input(s): AMMONIA in the  last 168 hours. CBC:  Recent Labs Lab 05/27/14 1517 05/28/14 0449 05/29/14 0450 05/30/14 0318  WBC 5.4 5.3 5.1 5.1  NEUTROABS 3.7  --   --   --   HGB 9.3* 8.7* 8.4* 8.6*  HCT 29.2* 28.1* 26.9* 27.2*  MCV 93.9 94.6 94.7 95.8  PLT 218 220 210 220   Cardiac Enzymes:  Recent Labs Lab 05/30/14 0318  CKTOTAL 29   BNP: Invalid input(s): POCBNP CBG:  Recent Labs Lab 05/28/14 2004  GLUCAP 220*    Recent Results (from the past 240 hour(s))  Blood Culture (routine x 2)     Status: None (Preliminary result)   Collection Time: 05/27/14  2:30 PM  Result Value Ref Range Status   Specimen Description BLOOD  RIGHT ARM  Final   Special Requests BOTTLES DRAWN AEROBIC AND ANAEROBIC 5CC  Final   Culture  Setup Time   Final    05/27/2014 21:42 Performed at [facility]    Culture   Final           BLOOD CULTURE RECEIVED NO GROWTH TO DATE CULTURE WILL BE HELD FOR 5 DAYS BEFORE ISSUING A FINAL NEGATIVE REPORT Performed at [facility]    Report Status PENDING  Incomplete  Culture, blood (routine x 2)     Status: None (Preliminary result)   Collection Time: 05/27/14  3:17 PM  Result Value Ref Range Status   Specimen Description BLOOD RIGHT HAND  Final   Special Requests BOTTLES DRAWN AEROBIC AND ANAEROBIC 10CC  Final   Culture  Setup Time   Final    05/28/2014 04:48 Performed at [facility]    Culture   Final           BLOOD CULTURE RECEIVED NO GROWTH TO DATE CULTURE WILL BE HELD FOR 5 DAYS BEFORE ISSUING A FINAL NEGATIVE REPORT Performed at [facility]    Report Status PENDING  Incomplete  Urine culture     Status: None   Collection Time: 05/27/14  3:35 PM  Result Value Ref Range Status   Specimen Description URINE, CLEAN CATCH  Final   Special Requests NONE  Final   Culture  Setup Time   Final    05/28/2014 05:41 Performed at [place]   Final    2,000 COLONIES/ML Performed at [facility]    Culture   Final    INSIGNIFICANT GROWTH Performed at [facility]    Report Status 05/29/2014 FINAL  Final  Urine culture     Status: None   Collection Time: 05/30/14  1:40 PM  Result Value Ref Range Status   Specimen Description URINE, CATHETERIZED  Final   Special Requests NONE  Final   Culture  Setup Time   Final    05/30/2014 14:10 Performed at [place] Performed at [facility]   Final   Culture NO GROWTH Performed at [facility]   Final   Report Status 05/31/2014 FINAL  Final     Scheduled Meds: . albuterol  2.5 mg Nebulization QID  .  antiseptic oral rinse  7 mL Mouth Rinse BID  . aspirin EC  325 mg Oral QHS  . budesonide  0.5 mg Nebulization BID  . carvedilol  25 mg Oral BID WC  . collagenase   Topical BID  . digoxin  0.125 mg Oral q morning - 10a  .  feeding supplement (ENSURE COMPLETE)  237 mL Oral BID BM  . ferrous sulfate  325 mg Oral QHS  . furosemide  40 mg Oral BID  . heparin  5,000 Units Subcutaneous 3 times per day  . methylPREDNISolone (SOLU-MEDROL) injection  60 mg Intravenous Q6H  . nitroGLYCERIN  0.2 mg Transdermal Daily  . simvastatin  20 mg Oral QHS  . tiotropium  18 mcg Inhalation Daily   Continuous Infusions:    Janessa Mickle, DO  [facility] Pager [phone]  If 7PM-7AM, please contact night-coverage [email or web address] Password [email or web address] 05/31/2014, 12:53 PM   LOS: 4 days

## 2014-06-01 LAB — BASIC METABOLIC PANEL
Anion gap: 13 (ref 5–15)
BUN: 39 mg/dL — AB (ref 6–23)
CHLORIDE: 92 meq/L — AB (ref 96–112)
CO2: 29 mEq/L (ref 19–32)
CREATININE: 0.89 mg/dL (ref 0.50–1.35)
Calcium: 8.6 mg/dL (ref 8.4–10.5)
GFR calc non Af Amer: 81 mL/min — ABNORMAL LOW (ref >90)
Glucose, Bld: 233 mg/dL — ABNORMAL HIGH (ref 70–99)
Potassium: 4.2 mEq/L (ref 3.7–5.3)
Sodium: 134 mEq/L — ABNORMAL LOW (ref 137–147)

## 2014-06-01 LAB — CBC
HEMATOCRIT: 26.7 % — AB (ref 39.0–52.0)
Hemoglobin: 8.5 g/dL — ABNORMAL LOW (ref 13.0–17.0)
MCH: 29.2 pg (ref 26.0–34.0)
MCHC: 31.8 g/dL (ref 30.0–36.0)
MCV: 91.8 fL (ref 78.0–100.0)
Platelets: 211 10*3/uL (ref 150–400)
RBC: 2.91 MIL/uL — ABNORMAL LOW (ref 4.22–5.81)
RDW: 14 % (ref 11.5–15.5)
WBC: 10.5 10*3/uL (ref 4.0–10.5)

## 2014-06-01 LAB — GLUCOSE, CAPILLARY: GLUCOSE-CAPILLARY: 383 mg/dL — AB (ref 70–99)

## 2014-06-01 MED ORDER — SILVER NITRATE-POT NITRATE 75-25 % EX MISC
1.0000 "application " | Freq: Once | CUTANEOUS | Status: AC
  Administered 2014-06-01: 1 via TOPICAL
  Filled 2014-06-01: qty 1

## 2014-06-01 MED ORDER — FUROSEMIDE 80 MG PO TABS
80.0000 mg | ORAL_TABLET | Freq: Every day | ORAL | Status: DC
  Administered 2014-06-02: 80 mg via ORAL
  Filled 2014-06-01: qty 1

## 2014-06-01 MED ORDER — INSULIN ASPART 100 UNIT/ML ~~LOC~~ SOLN
0.0000 [IU] | Freq: Three times a day (TID) | SUBCUTANEOUS | Status: DC
  Administered 2014-06-01: 9 [IU] via SUBCUTANEOUS
  Administered 2014-06-02 (×2): 3 [IU] via SUBCUTANEOUS
  Administered 2014-06-02: 5 [IU] via SUBCUTANEOUS

## 2014-06-01 MED ORDER — FUROSEMIDE 40 MG PO TABS
40.0000 mg | ORAL_TABLET | ORAL | Status: DC
  Administered 2014-06-01 – 2014-06-02 (×2): 40 mg via ORAL
  Filled 2014-06-01 (×2): qty 1

## 2014-06-01 MED ORDER — METHYLPREDNISOLONE SODIUM SUCC 125 MG IJ SOLR
60.0000 mg | Freq: Two times a day (BID) | INTRAMUSCULAR | Status: DC
  Administered 2014-06-01 – 2014-06-02 (×2): 60 mg via INTRAVENOUS
  Filled 2014-06-01 (×4): qty 0.96

## 2014-06-01 NOTE — Plan of Care (Signed)
Problem: Phase I Progression Outcomes Goal: OOB as tolerated unless otherwise ordered Outcome: Progressing Goal: Initial discharge plan identified Outcome: Progressing

## 2014-06-01 NOTE — Clinical Social Work Psychosocial (Signed)
Clinical Social Work Department BRIEF PSYCHOSOCIAL ASSESSMENT 06/01/2014  Patient:  [name], [name]     Account Number:  [id number]     Admit date:  05/27/2014  Clinical Social Worker:  Frederico Hamman  Date/Time:  06/01/2014 03:26 AM  Referred by:  Physician  Date Referred:  06/01/2014 Referred for  SNF Placement   Other Referral:   Interview type:  Patient Other interview type:   CSW also talked with wife who was at the bedside    PSYCHOSOCIAL DATA Living Status:  WIFE Admitted from facility:   Level of care:   Primary support name:  [name] Primary support relationship to patient:  SPOUSE Degree of support available:   Strong support from wife    CURRENT CONCERNS Current Concerns  Post-Acute Placement   Other Concerns:    SOCIAL WORK ASSESSMENT / PLAN CSW talked with patient and wife at the bedside regarding short-term rehab. 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 Mr. Langlois had already stated to others his facility preference: first [facility], then advised CSW that he would like [facility] as he and wife know a lot of people who have been there and were very pleased with the therapy. Mr. Bethel has never been to a skilled facility for rehab, although he has been to [facility] inpatient rehab and to outpatient rehab therapy.  CSW explained the facility search process and he and Mrs. Stockham were informed that they will be updated on facility responses.   Assessment/plan status:  Psychosocial Support/Ongoing Assessment of Needs Other assessment/ plan:   Information/referral to community resources:    PATIENT'S/FAMILY'S RESPONSE TO PLAN OF CARE: Patient and his wife were looking forward to speaking with CSW and he was very pleasant and receptive to discussing post-discharge plans.

## 2014-06-01 NOTE — Progress Notes (Addendum)
PROGRESS NOTE  [name] ZSW:[id number] DOB: April 15, 1936 DOA: 05/27/2014 PCP: Dorian Heckle, MD Brief history 78 y.o. male with a Past Medical History of COPD on home 32 (2L), chronic systolic heart failure , recently discharged from the Gen. Surgery service after Excision in closure of the midline decubitus ulcer (05/18/14) who presents with generalized weakness. Per patient, he is just not been feeling his usual self for the past 2-3 days prior to admission. Claims to have significant weakness, he claims it's a struggle walking around his house. He normally uses a cane and a walker to walk. He currently is on doxycycline for a "MRSA" infection of his wound. He has been on the antibiotic for several months continuously. He denies any fever or febrile illness. No nausea, vomiting or diarrhea with the past few days. He denies any abdominal pain. The patient was found to be volume depleted and hyperkalemic in the emergency department. He was given Kayexalate. There was also initial concern for infected wound on his sacral decubitus. General surgery was consulted. They did not feel that the sacral wound and was grossly infected. Although antibiotics were initially started, they were discontinued and the patient was observed off antibiotics. The patient did not have any leukocytosis, fever, or hemodynamic instability. Gen. surgery continued to follow the patient regarding his surgical wound. His sutures were removed and they recommended PT hydrotherapy while the patient was in the hospital. Gen. surgery did not feel that the patient needed any surgical debridement at this time. unfortunately, the patient developed acute on chronic respiratory failure with increasing shortness of breath on 05/30/2014. This was thought to be due to COPD exacerbation. The patient was started on intravenous Solu-Medrol with clinical improvement.  Assessment/Plan: Acute on chronic respiratory failure -05/30/14-  more short of breath and c/o in cough with green sputum in the last 12 hours -Less wheezing, moving better air since start of IV Solu-Medrol -Decrease IV steroids to 60 mg IV every 12 hours -Secondary to COPD exacerbation -05/29/2014 evening chest x-ray--negative pulmonary edema or consolidation -Continue aerosolized albuterol -Continue Pulmicort COPD exacerbation  -Slowly improving after starting IV Solu-Medrol -Pulmonary hygiene  -Continue supplemental oxygen -Clinically stable 2 L . Wound dehisence:  -discontinue IV antibiotics and observed as the patient is afebrile without any leukocytosis and hemodynamically stable  -remains clinically stable off antibiotics  -Continue wound care per general surgery recommendations -Hydrotherapy while in the hospital -Wound care recommendations per general surgery . generalized weakness:  -multifactorial including deconditioning, volume depletion, cardiomyopathy, recent URI -Certainly, the patient may have an infectious process -Discontinue antibiotics and observe -Physical therapy evaluated the patient and recommended skilled nursing facility  . Persistent atrial fibrillation:  -continue digoxin and Coreg. Monitoring telemetry. -Presently rate controlled  Severe Protein Calorie Malnutrition -nutritional supplements  . Hyperkalemia: Likely secondary to recently being on Bactrim ( only completed 2-3 days back) and also is on oral potassium.  -given 1 dose of Kayexalate, -Improved  . Essential hypertension:  -Will hold candesartan given hyperkalemia.  -Continue Lasix and Coreg for now. -hold amlodipine due to soft BP -Blood pressure remains well controlled off of amlodipine-  . Dyslipidemia: continue statin  . Cardiomyopathy, ischemic /Chronic systolic heart failure- EF 20-25% 2D 08/08/13: Clinically compensated -in fact may be on the slightly dry side.  -Restart furosemide 80 mg in the morning, 40 mg in the evening -Continue  Coreg.. -Compensated at this time -Restart ARB as hyperkalemia has resolved and  blood pressure has improved  . Coronary atherosclerosis: Without chest pain, monitoring telemetry. Continue aspirin, statin and beta blocker  . Biventricular automatic implantable cardioverter defibrillator in situ: Monitoring telemetry  . Chronic respiratory failure:  -secondary to COPD. Continue home O2--2L -stable  . COPD:  -continue nebulized bronchodilators. Continue usual dosing of prednisone. -no wheeze on exam -CXR today as pt c/o sob with cough with increased sputum  Deconditioning -Physical therapy and occupational therapy evaluation--> skilled nursing facility  Severe protein calorie malnutrition -Continue ensure Hyperglycemia -Secondary to intravenous steroids -Sensitive sliding scale NovoLog while on IV steroids  Family Communication:   Wife updated at beside Disposition Plan:   SNF 06/02/2014      Procedures/Studies: Dg Chest Port 1 View  05/29/2014   CLINICAL DATA:  Dyspnea, cough.  EXAM: PORTABLE CHEST - 1 VIEW  COMPARISON:  May 27, 2014.  FINDINGS: The heart size and mediastinal contours are within normal limits. Sternotomy wires are noted. Left-sided pacemaker is unchanged in position. No pneumothorax or pleural effusion is noted. Both lungs are clear. The visualized skeletal structures are unremarkable.  IMPRESSION: No acute cardiopulmonary abnormality seen.   Electronically Signed   By: Sabino Dick M.D.   On: 05/29/2014 19:33   Dg Chest Port 1 View  05/27/2014   CLINICAL DATA:  Weakness, short of breath  EXAM: PORTABLE CHEST - 1 VIEW  COMPARISON:  08/08/2013  FINDINGS: Left-sided pacemaker overlies stable enlarged cardiac silhouette. No effusion, infiltrate, pneumothorax.No acute cardiopulmonary process.  IMPRESSION: No acute cardiopulmonary process.   Electronically Signed   By: Suzy Bouchard M.D.   On: 05/27/2014 14:53         Subjective:  patient is  breathing better overall, but still has some dyspnea on exertion. He is working with physical therapy. Denies any fevers, chills, chest pain, nausea, vomiting, diarrhea, abdominal pain, dysuria. Appetite remains poor  Objective: Filed Vitals:   05/31/14 2028 06/01/14 0515 06/01/14 0813 06/01/14 0900  BP: 101/54 125/48  131/63  Pulse: 74 75  87  Temp: 97.6 F (36.4 C) 97.4 F (36.3 C)  97.8 F (36.6 C)  TempSrc:  Oral  Oral  Resp: 18 18  18   Height:      Weight:      SpO2: 99% 100% 98% 99%    Intake/Output Summary (Last 24 hours) at 06/01/14 1423 Last data filed at 06/01/14 0700  Gross per 24 hour  Intake    600 ml  Output   1050 ml  Net   -450 ml   Weight change:  Exam:   General:  Pt is alert, follows commands appropriately, not in acute distress  HEENT: No icterus, No thrush,  [place]/AT  Cardiovascular: RRR, S1/S2, no rubs, no gallops  Respiratory: minimal basilar wheeze. Good air movement. Bibasilar rales.   Abdomen: Soft/+BS, non tender, non distended, no guarding  Extremities: trace :E edema, No lymphangitis, No petechiae, No rashes, no synovitis  Data Reviewed: Basic Metabolic Panel:  Recent Labs Lab 05/27/14 1430 05/27/14 1715 05/28/14 0449 05/29/14 0450 05/30/14 0318 06/01/14 0344  NA 136*  --  140 142 139 134*  K 5.9* 4.8 4.4 3.8 3.9 4.2  CL 95*  --  101 101 98 92*  CO2 27  --  26 28 30 29   GLUCOSE 149*  --  158* 119* 117* 233*  BUN 57*  --  48* 32* 31* 39*  CREATININE 1.10  --  0.97 0.93 0.80 0.89  CALCIUM 9.2  --  8.5  8.6 8.4 8.6   Liver Function Tests:  Recent Labs Lab 05/27/14 1430 05/29/14 0450  AST 32 23  ALT 27 29  ALKPHOS 66 68  BILITOT 0.3 0.3  PROT 7.3 6.0  ALBUMIN 2.9* 2.4*   No results for input(s): LIPASE, AMYLASE in the last 168 hours. No results for input(s): AMMONIA in the last 168 hours. CBC:  Recent Labs Lab 05/27/14 1517 05/28/14 0449 05/29/14 0450 05/30/14 0318 06/01/14 0344  WBC 5.4 5.3 5.1 5.1 10.5    NEUTROABS 3.7  --   --   --   --   HGB 9.3* 8.7* 8.4* 8.6* 8.5*  HCT 29.2* 28.1* 26.9* 27.2* 26.7*  MCV 93.9 94.6 94.7 95.8 91.8  PLT 218 220 210 220 211   Cardiac Enzymes:  Recent Labs Lab 05/30/14 0318  CKTOTAL 29   BNP: Invalid input(s): POCBNP CBG:  Recent Labs Lab 05/28/14 2004  GLUCAP 220*    Recent Results (from the past 240 hour(s))  Blood Culture (routine x 2)     Status: None (Preliminary result)   Collection Time: 05/27/14  2:30 PM  Result Value Ref Range Status   Specimen Description BLOOD RIGHT ARM  Final   Special Requests BOTTLES DRAWN AEROBIC AND ANAEROBIC 5CC  Final   Culture  Setup Time   Final    05/27/2014 21:42 Performed at [facility]    Culture   Final           BLOOD CULTURE RECEIVED NO GROWTH TO DATE CULTURE WILL BE HELD FOR 5 DAYS BEFORE ISSUING A FINAL NEGATIVE REPORT Performed at [facility]    Report Status PENDING  Incomplete  Culture, blood (routine x 2)     Status: None (Preliminary result)   Collection Time: 05/27/14  3:17 PM  Result Value Ref Range Status   Specimen Description BLOOD RIGHT HAND  Final   Special Requests BOTTLES DRAWN AEROBIC AND ANAEROBIC 10CC  Final   Culture  Setup Time   Final    05/28/2014 04:48 Performed at [facility]    Culture   Final           BLOOD CULTURE RECEIVED NO GROWTH TO DATE CULTURE WILL BE HELD FOR 5 DAYS BEFORE ISSUING A FINAL NEGATIVE REPORT Performed at [facility]    Report Status PENDING  Incomplete  Urine culture     Status: None   Collection Time: 05/27/14  3:35 PM  Result Value Ref Range Status   Specimen Description URINE, CLEAN CATCH  Final   Special Requests NONE  Final   Culture  Setup Time   Final    05/28/2014 05:41 Performed at [place]   Final    2,000 COLONIES/ML Performed at [facility]    Culture   Final    INSIGNIFICANT GROWTH Performed at [facility]    Report Status  05/29/2014 FINAL  Final  Urine culture     Status: None   Collection Time: 05/30/14  1:40 PM  Result Value Ref Range Status   Specimen Description URINE, CATHETERIZED  Final   Special Requests NONE  Final   Culture  Setup Time   Final    05/30/2014 14:10 Performed at [place] Performed at [facility]   Final   Culture NO GROWTH Performed at [facility]   Final   Report Status 05/31/2014 FINAL  Final     Scheduled Meds: . albuterol  2.5 mg Nebulization QID  . antiseptic oral rinse  7 mL Mouth Rinse BID  . aspirin EC  325 mg Oral QHS  . budesonide  0.5 mg Nebulization BID  . carvedilol  25 mg Oral BID WC  . collagenase   Topical BID  . digoxin  0.125 mg Oral q morning - 10a  . feeding supplement (ENSURE COMPLETE)  237 mL Oral BID BM  . ferrous sulfate  325 mg Oral QHS  . furosemide  40 mg Oral BID  . heparin  5,000 Units Subcutaneous 3 times per day  . irbesartan  75 mg Oral Daily  . methylPREDNISolone (SOLU-MEDROL) injection  60 mg Intravenous Q12H  . nitroGLYCERIN  0.2 mg Transdermal Daily  . simvastatin  20 mg Oral QHS  . tiotropium  18 mcg Inhalation Daily   Continuous Infusions:    Sabrin Dunlevy, DO  [facility] Pager [phone]  If 7PM-7AM, please contact night-coverage [email or web address] Password [email or web address] 06/01/2014, 2:23 PM   LOS: 5 days

## 2014-06-01 NOTE — Progress Notes (Signed)
Physical Therapy Treatment Patient Details Name: [name] MRN: [id number] DOB: 28-Jun-1936 Today's Date: 06/01/2014    History of Present Illness Pt is a 78 y.o. male with a PMH of COPD on home 02, chronic systolic heart failure, recently discharged from the Gen. Surgery service afterexcision in closure of the midline decubitus ulcer who presents today with the above noted complaint. Per patient, he is just not been feeling his usual self for the past 2-3 days PTA. Claims to have significant weakness, he claims it's a struggle walking around his house. He normally uses a cane and a walker to walk. He presented to the ED with these complaints, was found to have mild hyperkalemia, UA was consistent with UTI. During ED evaluation, it appeared that he had some pus in his sacral wound dressing, and appears to he may have had a wound dehiscence. Patient admitted for further evaluation and treatment.    PT Comments    Pt progressing towards physical therapy goals. Tolerance for functional activity is low, and pt is limited by DOE. Pt on 3L/min supplemental O2 throughout gait training, and sats remained >95% throughout session.  PT discussed discharge plan with pt and wife who was present. They feel that pt will recover better in a controlled rehab environment vs. home. Therapist discussed benefits of SNF vs. HHPT and explained the bed search process. As pt continues to be limited by DOE and requires assist for safe mobility, feel that SNF is an appropriate option at d/c. Pt/wife appear anxious to have a plan set in place.    Follow Up Recommendations  SNF;Supervision/Assistance - 24 hour     Equipment Recommendations  None recommended by PT    Recommendations for Other Services       Precautions / Restrictions Precautions Precautions: Fall Restrictions Weight Bearing Restrictions: No    Mobility  Bed Mobility               General bed mobility comments: Pt sitting in recliner  chair upon PT arrival.   Transfers Overall transfer level: Needs assistance Equipment used: Rolling walker (2 wheeled) Transfers: Sit to/from Stand Sit to Stand: Min guard         General transfer comment: VC's for general safety awareness as pt was somewhat impulsive to initiate transfers. No physical assist required however close guard for safety.  Ambulation/Gait Ambulation/Gait assistance: Min guard Ambulation Distance (Feet): 100 Feet Assistive device: Rolling walker (2 wheeled) Gait Pattern/deviations: Step-through pattern;Decreased stride length;Trunk flexed Gait velocity: Decreased Gait velocity interpretation: Below normal speed for age/gender General Gait Details: VC's not to rush and for pursed-lip breathing. Encouraged pt not to talk while ambulating to minimize SOB and focus on breathing, however pt continued to talk, and distance was limited.    Stairs            Wheelchair Mobility    Modified Rankin (Stroke Patients Only)       Balance Overall balance assessment: Needs assistance Sitting-balance support: Feet supported;No upper extremity supported Sitting balance-Leahy Scale: Good     Standing balance support: No upper extremity supported;During functional activity Standing balance-Leahy Scale: Fair Standing balance comment: Pt was able to perform static standing at edge of chair without UE support.                     Cognition Arousal/Alertness: Awake/alert Behavior During Therapy: WFL for tasks assessed/performed;Anxious Overall Cognitive Status: Within Functional Limits for tasks assessed  Exercises General Exercises - Lower Extremity Long Arc Quad: 10 reps Hip ABduction/ADduction: 10 reps;Strengthening (Isometric adduction, manual resistance abduction)    General Comments        Pertinent Vitals/Pain Pain Assessment: No/denies pain Pain Intervention(s): Premedicated before session    Home  Living                      Prior Function            PT Goals (current goals can now be found in the care plan section) Acute Rehab PT Goals Patient Stated Goal: Return home independently. PT Goal Formulation: With patient/family Time For Goal Achievement: 06/05/14 Potential to Achieve Goals: Good Progress towards PT goals: Progressing toward goals    Frequency  Min 3X/week    PT Plan Discharge plan needs to be updated    Co-evaluation             End of Session Equipment Utilized During Treatment: Gait belt;Oxygen Activity Tolerance: Patient limited by fatigue (SOB) Patient left: in chair;with chair alarm set;with call bell/phone within reach;with family/visitor present     Time: 1135-1206 PT Time Calculation (min) (ACUTE ONLY): 31 min  Charges:  $Gait Training: 8-22 mins $Therapeutic Activity: 8-22 mins                    G Codes:      Rolinda Roan 06/18/2014, 1:09 PM  Rolinda Roan, PT, DPT Acute Rehabilitation Services Pager: [phone]

## 2014-06-01 NOTE — Progress Notes (Signed)
Inpatient Diabetes Program Recommendations  AACE/ADA: New Consensus Statement on Inpatient Glycemic Control (2013)  Target Ranges:  Prepandial:   less than 140 mg/dL      Peak postprandial:   less than 180 mg/dL (1-2 hours)      Critically ill patients:  140 - 180 mg/dL     Results for [name], [name] (MRN [id number]) as of 06/01/2014 09:57  Ref. Range 05/30/2014 03:18 06/01/2014 03:44  Glucose Latest Range: 70-99 mg/dL 117 (H) 233 (H)     Currently getting IV Solumedrol 60 mg Q6 hours.  Elevated lab glucose this AM.   MD- Please consider adding Novolog Sensitive SSI tid ac + HS while patient getting IV steroids    Will follow Wyn Quaker RN, MSN, CDE Diabetes Coordinator Inpatient Diabetes Program Team Pager: [phone] (8a-10p)

## 2014-06-01 NOTE — Progress Notes (Signed)
Occupational Therapy Treatment Patient Details Name: [name] MRN: [id number] DOB: July 13, 1935 Today's Date: 06/01/2014    History of present illness Pt is a 78 y.o. male with a PMH of COPD on home 02, chronic systolic heart failure, recently discharged from the Gen. Surgery service afterexcision in closure of the midline decubitus ulcer who presents today with the above noted complaint. Per patient, he is just not been feeling his usual self for the past 2-3 days PTA. Claims to have significant weakness, he claims it's a struggle walking around his house. He normally uses a cane and a walker to walk. He presented to the ED with these complaints, was found to have mild hyperkalemia, UA was consistent with UTI. During ED evaluation, it appeared that he had some pus in his sacral wound dressing, and appears to he may have had a wound dehiscence. Patient admitted for further evaluation and treatment.   OT comments  Pt progressing in standing tolerance for grooming.  Pt typically self caths and does not have adequate endurance for this. Instructed in energy conservation and provided handout.  Pt is requesting SNF for ST rehab.  Pt wants to maximize independence and decrease burden on his wife prior to returning home.  Will continue to follow.  Follow Up Recommendations  SNF    Equipment Recommendations  None recommended by OT    Recommendations for Other Services      Precautions / Restrictions Precautions Precautions: Fall Restrictions Weight Bearing Restrictions: No       Mobility Bed Mobility               General bed mobility comments: Pt sitting in recliner chair upon PT arrival.   Transfers Overall transfer level: Needs assistance Equipment used: Rolling walker (2 wheeled) Transfers: Sit to/from Stand Sit to Stand: Min guard         General transfer comment: close guard for safety, verbal cues for pacing, some impulsivity noted    Balance Overall balance  assessment: Needs assistance Sitting-balance support: Feet supported;No upper extremity supported Sitting balance-Leahy Scale: Good     Standing balance support: No upper extremity supported;During functional activity Standing balance-Leahy Scale: Fair Standing balance comment: Pt was able to perform static standing at edge of chair without UE support.                    ADL Overall ADL's : Needs assistance/impaired     Grooming: Min guard;Wash/dry hands;Oral Neurosurgeon Details (indicate cue type and reason): pt typically self caths in standing         Functional mobility during ADLs: Min guard;Rolling walker General ADL Comments: Instructed at length in energy conservation.  Pt with concerns about going home and being a burden on his wife for his self catheterizing and wound care.  Agreeable to SNF for ST rehab.      Vision                     Perception     Praxis      Cognition   Behavior During Therapy: [facility] for tasks assessed/performed;Anxious Overall Cognitive Status: Within Functional Limits for tasks assessed                       Extremity/Trunk Assessment  Exercises   Shoulder Instructions       General Comments      Pertinent Vitals/ Pain       Pain Assessment: No/denies pain   Home Living                                          Prior Functioning/Environment              Frequency Min 2X/week     Progress Toward Goals  OT Goals(current goals can now be found in the care plan section)  Progress towards OT goals: Progressing toward goals  Acute Rehab OT Goals Patient Stated Goal: Return home independently. Time For Goal Achievement: 06/14/14  Plan Discharge plan needs to be updated    Co-evaluation                 End of Session Equipment Utilized During Treatment: Gait belt;Rolling walker;Oxygen   Activity Tolerance  Patient limited by fatigue   Patient Left in chair;with call bell/phone within reach;with nursing/sitter in room;with family/visitor present   Nurse Communication          Time: 1210-1240 OT Time Calculation (min): 30 min  Charges: OT General Charges $OT Visit: 1 Procedure OT Treatments $Self Care/Home Management : 23-37 mins  Malka So 06/01/2014, 1:27 PM  [phone]

## 2014-06-01 NOTE — Progress Notes (Signed)
Physical Therapy Wound Treatment Patient Details  Name: DEJOHN IBARRA MRN: [id number] Date of Birth: March 17, 1936  Today's Date: 06/01/2014 Time: 5537-4827 Time Calculation (min): 55 min  Subjective  Subjective: I live to do anything I haven't done.Marland KitchenMarland KitchenI've been dealing with this for a year. Patient and Family Stated Goals: Get this wound to heal up. Date of Onset:  (year ago) Prior Treatments: many treatments over the year  Pain Score:    Wound Assessment  Pressure Ulcer 08/08/13 Stage II -  Partial thickness loss of dermis presenting as a shallow open ulcer with a red, pink wound bed without slough. (Active)     Pressure Ulcer sacrum red blanchable (Active)  Dressing Type Benzoine;Silicone dressing;Gauze (Comment) 06/01/2014 11:14 AM  Dressing Changed;Clean;Dry 06/01/2014 11:14 AM  Dressing Change Frequency Twice a day 06/01/2014 11:14 AM  State of Healing Eschar 06/01/2014 11:14 AM  Site / Wound Assessment Bleeding;Yellow;Pale;Pink 06/01/2014 11:14 AM  % Wound base Red or Granulating 5% 06/01/2014 11:14 AM  % Wound base Yellow 95% 06/01/2014 11:14 AM  Peri-wound Assessment Intact 06/01/2014 11:14 AM  Wound Length (cm) 3.8 cm 06/01/2014 11:14 AM  Wound Width (cm) 2 cm 06/01/2014 11:14 AM  Wound Depth (cm) 2.5 cm 06/01/2014 11:14 AM  Undermining (cm) 2.0 at 6 oclock 06/01/2014 11:14 AM  Drainage Amount Minimal 06/01/2014 11:14 AM  Drainage Description Serosanguineous 06/01/2014 11:14 AM  Treatment Cleansed;Debridement (Selective);Hydrotherapy (Pulse lavage);Packing (Saline gauze) 06/01/2014 11:14 AM     Wound / Incision (Open or Dehisced) 05/18/14 Other (Comment) Sacrum Mid Debrided Non-healing sacral wound (Active)  Dressing Type Gauze (Comment);Moist to dry 05/31/2014  9:39 PM  Dressing Changed Changed 05/31/2014  9:39 PM  Dressing Status Clean;Dry;Intact 06/01/2014  7:55 AM  Dressing Change Frequency Twice a day 06/01/2014  7:55 AM  Site / Wound Assessment Red 05/31/2014   5:00 PM  Margins Unattached edges (unapproximated) 05/31/2014  9:39 PM  Closure Sutures 05/30/2014 10:45 AM  Drainage Amount Minimal 05/31/2014  9:39 PM  Drainage Description Serosanguineous 05/31/2014  9:39 PM  Treatment Cleansed;Other (Comment) 05/31/2014  9:39 PM   Hydrotherapy Pulsed lavage therapy - wound location: sacrum Pulsed Lavage with Suction (psi): 8 psi Pulsed Lavage with Suction - Normal Saline Used: 1000 mL Pulsed Lavage Tip: Tip with splash shield Selective Debridement Selective Debridement - Location: sacrum Selective Debridement - Tools Used: Forceps;Scissors Selective Debridement - Tissue Removed: eschar, fibrin   Wound Assessment and Plan  Wound Therapy - Assess/Plan/Recommendations Wound Therapy - Clinical Statement: Pt can benefit from hydrotherapy and selective debridement to take this wound from chronic to more acute to promote progress toward healing. Wound Therapy - Functional Problem List: deconditioned Factors Delaying/Impairing Wound Healing: Infection - systemic/local;Altered sensation Hydrotherapy Plan: Debridement;Dressing change;Patient/family education;Pulsatile lavage with suction Wound Therapy - Frequency: 6X / week Wound Therapy - Follow Up Recommendations: [place] Wound Plan: see above  Wound Therapy Goals- Improve the function of patient's integumentary system by progressing the wound(s) through the phases of wound healing (inflammation - proliferation - remodeling) by: Decrease Necrotic Tissue to: 75 Decrease Necrotic Tissue - Progress: Goal set today Increase Granulation Tissue to: 25 Increase Granulation Tissue - Progress: Goal set today Goals/treatment plan/discharge plan were made with and agreed upon by patient/family: Yes Time For Goal Achievement: 7 days Wound Therapy - Potential for Goals: Good  Goals will be updated until maximal potential achieved or discharge criteria met.  Discharge criteria: when goals achieved,  discharge from hospital, MD decision/surgical intervention, no progress towards goals, refusal/missing  three consecutive treatments without notification or medical reason.  GP     Kataleyah Carducci, Tessie Fass 06/01/2014, 11:27 AM  06/01/2014  [name], PT [phone]  (pager)

## 2014-06-02 LAB — CULTURE, BLOOD (ROUTINE X 2): Culture: NO GROWTH

## 2014-06-02 LAB — GLUCOSE, CAPILLARY
GLUCOSE-CAPILLARY: 289 mg/dL — AB (ref 70–99)
Glucose-Capillary: 215 mg/dL — ABNORMAL HIGH (ref 70–99)
Glucose-Capillary: 226 mg/dL — ABNORMAL HIGH (ref 70–99)
Glucose-Capillary: 233 mg/dL — ABNORMAL HIGH (ref 70–99)

## 2014-06-02 MED ORDER — COLLAGENASE 250 UNIT/GM EX OINT: TOPICAL_OINTMENT | Freq: Two times a day (BID) | CUTANEOUS | Status: DC

## 2014-06-02 MED ORDER — CANDESARTAN CILEXETIL 8 MG PO TABS: 8.0000 mg | ORAL_TABLET | Freq: Every morning | ORAL | Status: DC

## 2014-06-02 MED ORDER — PREDNISONE 10 MG PO TABS: 10.0000 mg | ORAL_TABLET | Freq: Every day | ORAL | Status: DC

## 2014-06-02 MED ORDER — PREDNISONE 20 MG PO TABS: 60.0000 mg | ORAL_TABLET | Freq: Every day | ORAL | Status: DC

## 2014-06-02 MED ORDER — LEVOFLOXACIN 500 MG PO TABS: 500.0000 mg | ORAL_TABLET | Freq: Every day | ORAL | Status: DC

## 2014-06-02 NOTE — Progress Notes (Signed)
Called report to [facility], Bartolo Darter, LPN

## 2014-06-02 NOTE — Progress Notes (Signed)
Inpatient Diabetes Program Recommendations  AACE/ADA: New Consensus Statement on Inpatient Glycemic Control (2013)  Target Ranges:  Prepandial:   less than 140 mg/dL      Peak postprandial:   less than 180 mg/dL (1-2 hours)      Critically ill patients:  140 - 180 mg/dL  Results for SAIFAN, RAYFORD (MRN [id number]) as of 06/02/2014 10:42  Ref. Range 06/01/2014 16:29 06/01/2014 21:57 06/02/2014 07:52  Glucose-Capillary Latest Range: 70-99 mg/dL 383 (H) 215 (H) 226 (H)   Inpatient Diabetes Program Recommendations Correction (SSI): consider increasing Novolog correction scale during steroid therapy Thank you  Raoul Pitch BSN, RN,CDE Inpatient Diabetes Coordinator [phone] (team pager)

## 2014-06-02 NOTE — Plan of Care (Signed)
Problem: Phase I Progression Outcomes Goal: OOB as tolerated unless otherwise ordered Outcome: Completed/Met Date Met:  06/02/14 Goal: Initial discharge plan identified Outcome: Completed/Met Date Met:  06/02/14  Problem: Phase II Progression Outcomes Goal: Progress activity as tolerated unless otherwise ordered Outcome: Completed/Met Date Met:  06/02/14 Goal: Discharge plan established Outcome: Completed/Met Date Met:  06/02/14 Goal: Vital signs remain stable Outcome: Completed/Met Date Met:  06/02/14 Goal: Obtain order to discontinue catheter if appropriate Outcome: Completed/Met Date Met:  06/02/14  Problem: Phase III Progression Outcomes Goal: Pain controlled on oral analgesia Outcome: Completed/Met Date Met:  06/02/14 Goal: Activity at appropriate level-compared to baseline (UP IN CHAIR FOR HEMODIALYSIS)  Outcome: Completed/Met Date Met:  06/02/14 Goal: Voiding independently Outcome: Completed/Met Date Met:  06/02/14 Goal: IV/normal saline lock discontinued Outcome: Completed/Met Date Met:  06/02/14 Goal: Foley discontinued Outcome: Not Applicable Date Met:  06/02/14 Goal: Discharge plan remains appropriate-arrangements made Outcome: Completed/Met Date Met:  06/02/14  Problem: Discharge Progression Outcomes Goal: Discharge plan in place and appropriate Outcome: Completed/Met Date Met:  06/02/14 Goal: Pain controlled with appropriate interventions Outcome: Completed/Met Date Met:  06/02/14 Goal: Hemodynamically stable Outcome: Completed/Met Date Met:  06/02/14 Goal: Complications resolved/controlled Outcome: Completed/Met Date Met:  06/02/14 Goal: Tolerating diet Outcome: Completed/Met Date Met:  06/02/14 Goal: Activity appropriate for discharge plan Outcome: Completed/Met Date Met:  06/02/14     

## 2014-06-02 NOTE — Progress Notes (Signed)
Pt getting hydrotherapy at this time to sacral would.  Pt states after hydrotherapy he needs to be disimpacted he cannot move his bowels on his own.  Paged Dr. Carles Collet if we needed an order for this.  Pt does this at home.

## 2014-06-02 NOTE — Progress Notes (Signed)
Checked rectal vault, hard medium ball of stool extracted.  In and out cathed 600 cc's clear yellow urine.  Per Dr Tat V/O ok to remove pt from C-diff precautions.

## 2014-06-02 NOTE — Progress Notes (Signed)
Dr. Carles Collet returned call and V/O ok to check rectal vault and he would order some cathartics.

## 2014-06-02 NOTE — Progress Notes (Signed)
Patient discharged via ambulance transport to [facility].

## 2014-06-02 NOTE — Clinical Social Work Placement (Signed)
Clinical Social Work Department CLINICAL SOCIAL WORK PLACEMENT NOTE 06/02/2014  Patient:  Robert, Bradley  Account Number:  [id number] Admit date:  05/27/2014  Clinical Social Worker:  Buffie Herne Givens, LCSW  Date/time:  06/02/2014 05:59 AM  Clinical Social Work is seeking post-discharge placement for this patient at the following level of care:   SKILLED NURSING   (*CSW will update this form in Epic as items are completed)   06/01/2014  Patient/family provided with [facility] Department of Clinical Social Work's list of facilities offering this level of care within the geographic area requested by the patient (or if unable, by the patient's family).  06/01/2014  Patient/family informed of their freedom to choose among providers that offer the needed level of care, that participate in Medicare, Medicaid or managed care program needed by the patient, have an available bed and are willing to accept the patient.    Patient/family informed of [facility]' ownership interest in [facility], as well as of the fact that they are under no obligation to receive care at this facility.  PASARR submitted to [employer] in 2008  PASARR number received in 2008   FL2 transmitted to all facilities in geographic area requested by pt/family on  06/01/2014 FL2 transmitted to all facilities within larger geographic area on   Patient informed that his/her managed care company has contracts with or will negotiate with  certain facilities, including the following:     Patient/family informed of bed offers received:  06/02/2014 Patient chooses bed at [facility] Physician recommends and patient chooses bed at    Patient to be transferred to [facility] on  06/02/2014 Patient to be transferred to facility by ambulance Patient and family notified of transfer on 06/02/2014 Name of family member notified:  Wife, [name]  The following physician  request were entered in Epic:   Additional Comments:

## 2014-06-02 NOTE — Discharge Summary (Signed)
Physician Discharge Summary  [name] NID:[id number] DOB: 1936-04-18 DOA: 05/27/2014  PCP: Robert Heckle, MD  Admit date: 05/27/2014 Discharge date: 06/02/2014  Recommendations for Outpatient Follow-up:  1. Pt will need to follow up with PCP in 2 weeks post discharge 2. Please obtain BMP and CBC in one week 3. Maintain 3L [place] and wean to maintain oxygen saturation >92% 4. Follow up Dr. Johnathan Bradley in 2 weeks   Discharge Diagnoses:  Acute on chronic respiratory failure -05/30/14- more short of breath and c/o in cough with green sputum in the last 12 hours -Less wheezing, moving better air since start of IV Solu-Medrol -Decrease IV steroids to 60 mg IV every 12 hours -The patient will be discharged with a prednisone taper--60 mg daily 3 days, 40 mg daily 3 days, 20 mg daily 3 days, then back to 10 mg daily which is his baseline dose. -Secondary to COPD exacerbation -05/29/2014 evening chest x-ray--negative pulmonary edema or consolidation -Continue aerosolized albuterol -Continue Pulmicort -Levofloxacin 500mg  daily x 5 days COPD exacerbation  -Slowly improving after starting IV Solu-Medrol -Pulmonary hygiene  -Continue supplemental oxygen  -Clinically stable 3 L -Wean oxygen for oxygen saturation greater than 90% back 2 liters baseline . Wound dehisence:  -discontinue IV antibiotics and observed as the patient is afebrile without any leukocytosis and hemodynamically stable  -remains clinically stable off antibiotics  -Continue wound care per general surgery recommendations -Hydrotherapy while in the hospital -Wound care recommendations per general surgery-->Dressing changes to sacral wound BID - clean wound, then apply santyl, then pack with wet to dry dressing, cover with sacral mepilex -Follow up Dr. Hassell Done in 2 weeks . generalized weakness:  -multifactorial including deconditioning, volume depletion, cardiomyopathy, recent URI, and COPD exac -Certainly,  the patient may have an infectious process -Discontinue antibiotics and observe--remained clinically stable, afebrile and hemodynamically stable -Physical therapy evaluated the patient and recommended skilled nursing facility with which the pt agreed . Persistent atrial fibrillation:  -continue digoxin and Coreg.  -Presently rate controlled  Severe Protein Calorie Malnutrition -nutritional supplements  . Hyperkalemia: Likely secondary to recently being on Bactrim ( only completed 2-3 days back) and also is on oral potassium.  -given 1 dose of Kayexalate, -Improved  . Essential hypertension:  -candesartan initially held due to hyperkalemia -ARB was restarted at lower dose after hyperkalemia resolved -Continue Lasix and Coreg for now. -hold amlodipine due to soft BP -Blood pressure remains well controlled off of amlodipine-  . Dyslipidemia: continue statin  . Cardiomyopathy, ischemic /Chronic systolic heart failure- EF 20-25% 2D 08/08/13: Clinically compensated -in fact may be on the slightly dry side.  -Restart furosemide 80 mg in the morning, 40 mg in the evening -Continue Coreg.. -Compensated at this time -Restart ARB as hyperkalemia has resolved and blood pressure has improved -home with candesartan 8mg  daily  . Coronary atherosclerosis: Without chest pain, monitoring telemetry. Continue aspirin, statin and beta blocker  . Biventricular automatic implantable cardioverter defibrillator in situ: Monitoring telemetry  Deconditioning -Physical therapy and occupational therapy evaluation--> skilled nursing facility  Severe protein calorie malnutrition -Continue ensure Hyperglycemia -Secondary to intravenous steroids -Sensitive sliding scale NovoLog while on IV steroids  Discharge Condition: Stable  Disposition:      Follow-up Information    Follow up with Robert Earls, MD.   Specialty:  General Surgery   Contact information:   [place] [address] [phone]      skilled nursing facility  Diet:cardiac Wt Readings from Last  3 Encounters:  05/30/14 63.504 kg (140 lb)  05/18/14 66.679 kg (147 lb)  05/15/14 66.679 kg (147 lb)    History of present illness:  78 y.o. male with a Past Medical History of COPD on home 02 (2L), chronic systolic heart failure , recently discharged from the Gen. Surgery service after Excision in closure of the midline decubitus ulcer (05/18/14) who presents with generalized weakness. Per patient, he is just not been feeling his usual self for the past 2-3 days prior to admission. Claims to have significant weakness, he claims it's a struggle walking around his house. He normally uses a cane and a walker to walk. He currently is on doxycycline for a "MRSA" infection of his wound. He has been on the antibiotic for several months continuously. He denies any fever or febrile illness. No nausea, vomiting or diarrhea with the past few days. He denies any abdominal pain. The patient was found to be volume depleted and hyperkalemic in the emergency department. He was given Kayexalate. There was also initial concern for infected wound on his sacral decubitus. General surgery was consulted. They did not feel that the sacral wound and was grossly infected. Although antibiotics were initially started, they were discontinued and the patient was observed off antibiotics. The patient did not have any leukocytosis, fever, or hemodynamic instability. Gen. surgery continued to follow the patient regarding his surgical wound. His sutures were removed and they recommended PT hydrotherapy while the patient was in the hospital. Gen. surgery did not feel that the patient needed any surgical debridement at this time. unfortunately, the patient developed acute on chronic respiratory failure with increasing shortness of breath on 05/30/2014. This was thought to be due to COPD exacerbation. The patient was started on  intravenous Solu-Medrol with clinical improvement. The patient will be discharged with a prednisone taper as discussed above    Consultants: General surgery  Discharge Exam: Filed Vitals:   06/02/14 0941  BP: 111/44  Pulse: 87  Temp: 97.8 F (36.6 C)  Resp: 18   Filed Vitals:   06/01/14 1530 06/01/14 2149 06/02/14 0549 06/02/14 0941  BP: 127/46 117/69 119/43 111/44  Pulse: 83 78 86 87  Temp:  97.6 F (36.4 C) 97.4 F (36.3 C) 97.8 F (36.6 C)  TempSrc:  Oral Oral Oral  Resp: 18 20 20 18   Height:      Weight:      SpO2: 100% 100% 98% 100%   General: A&O x 3, NAD, pleasant, cooperative Cardiovascular: RRR, no rub, no gallop, no S3 Respiratory: Diminished breath sounds bilateral. Bilateral rales without any wheezing. Abdomen:soft, nontender, nondistended, positive bowel sounds Extremities: 1+LE edema, No lymphangitis, no petechiae  Discharge Instructions     Medication List    STOP taking these medications        doxycycline 100 MG DR capsule  Commonly known as:  DORYX     naproxen sodium 220 MG tablet  Commonly known as:  ANAPROX     sulfamethoxazole-trimethoprim 800-160 MG per tablet  Commonly known as:  BACTRIM DS,SEPTRA DS      TAKE these medications        acetaminophen 325 MG tablet  Commonly known as:  TYLENOL  Take 2 tablets (650 mg total) by mouth every 4 (four) hours as needed for headache or mild pain.     albuterol (2.5 MG/3ML) 0.083% nebulizer solution  Commonly known as:  PROVENTIL  Take 2.5 mg by nebulization 4 (four) times daily.  albuterol 108 (90 BASE) MCG/ACT inhaler  Commonly known as:  PROVENTIL HFA;VENTOLIN HFA  Inhale 2 puffs into the lungs every 6 (six) hours as needed for wheezing or shortness of breath.     PROVENTIL HFA 108 (90 BASE) MCG/ACT inhaler  Generic drug:  albuterol  INHALE 1 TO 2 PUFFS INTO THE LUNGS EVERY 6 HOURS AS NEEDED FOR SHORTNESS OF BREATH     amLODipine 5 MG tablet  Commonly known as:  NORVASC    TAKE 1 TABLET DAILY     aspirin EC 325 MG tablet  Take 325 mg by mouth at bedtime.     budesonide 0.5 MG/2ML nebulizer solution  Commonly known as:  PULMICORT  Take 0.5 mg by nebulization 2 (two) times daily.     candesartan 8 MG tablet  Commonly known as:  ATACAND  Take 1 tablet (8 mg total) by mouth every morning.     carvedilol 25 MG tablet  Commonly known as:  COREG  Take 25 mg by mouth 2 (two) times daily with a meal.     collagenase ointment  Commonly known as:  SANTYL  Apply topically 2 (two) times daily.     feeding supplement (ENSURE COMPLETE) Liqd  Take 237 mLs by mouth 2 (two) times daily between meals.     ferrous sulfate 325 (65 FE) MG tablet  Take 325 mg by mouth at bedtime.     furosemide 40 MG tablet  Commonly known as:  LASIX  TAKE 2 TABLETS IN THE MORNING AND 1 TABLET IN THE EVENING     guaiFENesin 600 MG 12 hr tablet  Commonly known as:  MUCINEX  Take 600 mg by mouth 2 (two) times daily as needed. congestion     HYDROcodone-acetaminophen 2.5-500 MG per tablet  Commonly known as:  VICODIN  Take 1 tablet by mouth every 6 (six) hours as needed for pain.     KLOR-CON M20 20 MEQ tablet  Generic drug:  potassium chloride SA  TAKE 1 TABLET DAILY     digoxin 0.125 MG tablet  Commonly known as:  LANOXIN  Take 0.125 mg by mouth every morning.     LANOXIN 0.125 MG tablet  Generic drug:  digoxin  TAKE 1 TABLET DAILY     levofloxacin 500 MG tablet  Commonly known as:  LEVAQUIN  Take 1 tablet (500 mg total) by mouth daily.     mupirocin ointment 2 %  Commonly known as:  BACTROBAN  Apply 1 application topically 2 (two) times daily as needed (Cuts and bruises).     nitroGLYCERIN 0.4 MG SL tablet  Commonly known as:  NITROSTAT  Place 0.4 mg under the tongue every 5 (five) minutes as needed for chest pain.     nitroGLYCERIN 0.2 mg/hr patch  Commonly known as:  NITRODUR - Dosed in mg/24 hr  Place 1 patch onto the skin daily.     predniSONE 10 MG  tablet  Commonly known as:  DELTASONE  Take 1 tablet (10 mg total) by mouth daily. Resume after 9 day prednisone taper     predniSONE 20 MG tablet  Commonly known as:  DELTASONE  Take 3 tablets (60 mg total) by mouth daily with breakfast. X 3 days, then 2 tablets (40mg ) daily with breakfastx 3 days, then 1 tablet (20mg ) daily x 3 days  Start taking on:  06/03/2014     PRESCRIPTION MEDICATION  Uses 1L-2L of home Oxygen     simvastatin 20 MG tablet  Commonly known as:  ZOCOR  Take 20 mg by mouth at bedtime.     tiotropium 18 MCG inhalation capsule  Commonly known as:  SPIRIVA  Place 18 mcg into inhaler and inhale daily.         The results of significant diagnostics from this hospitalization (including imaging, microbiology, ancillary and laboratory) are listed below for reference.    Significant Diagnostic Studies: Dg Chest Port 1 View  05/29/2014   CLINICAL DATA:  Dyspnea, cough.  EXAM: PORTABLE CHEST - 1 VIEW  COMPARISON:  May 27, 2014.  FINDINGS: The heart size and mediastinal contours are within normal limits. Sternotomy wires are noted. Left-sided pacemaker is unchanged in position. No pneumothorax or pleural effusion is noted. Both lungs are clear. The visualized skeletal structures are unremarkable.  IMPRESSION: No acute cardiopulmonary abnormality seen.   Electronically Signed   By: Sabino Dick M.D.   On: 05/29/2014 19:33   Dg Chest Port 1 View  05/27/2014   CLINICAL DATA:  Weakness, short of breath  EXAM: PORTABLE CHEST - 1 VIEW  COMPARISON:  08/08/2013  FINDINGS: Left-sided pacemaker overlies stable enlarged cardiac silhouette. No effusion, infiltrate, pneumothorax.No acute cardiopulmonary process.  IMPRESSION: No acute cardiopulmonary process.   Electronically Signed   By: Suzy Bouchard M.D.   On: 05/27/2014 14:53     Microbiology: Recent Results (from the past 240 hour(s))  Blood Culture (routine x 2)     Status: None   Collection Time: 05/27/14  2:30 PM    Result Value Ref Range Status   Specimen Description BLOOD RIGHT ARM  Final   Special Requests BOTTLES DRAWN AEROBIC AND ANAEROBIC 5CC  Final   Culture  Setup Time   Final    05/27/2014 21:42 Performed at [place]   Final    NO GROWTH 5 DAYS Performed at [facility]    Report Status 06/02/2014 FINAL  Final  Culture, blood (routine x 2)     Status: None (Preliminary result)   Collection Time: 05/27/14  3:17 PM  Result Value Ref Range Status   Specimen Description BLOOD RIGHT HAND  Final   Special Requests BOTTLES DRAWN AEROBIC AND ANAEROBIC 10CC  Final   Culture  Setup Time   Final    05/28/2014 04:48 Performed at [facility]    Culture   Final           BLOOD CULTURE RECEIVED NO GROWTH TO DATE CULTURE WILL BE HELD FOR 5 DAYS BEFORE ISSUING A FINAL NEGATIVE REPORT Performed at [facility]    Report Status PENDING  Incomplete  Urine culture     Status: None   Collection Time: 05/27/14  3:35 PM  Result Value Ref Range Status   Specimen Description URINE, CLEAN CATCH  Final   Special Requests NONE  Final   Culture  Setup Time   Final    05/28/2014 05:41 Performed at [facility]   Final    2,000 COLONIES/ML Performed at [facility]    Culture   Final    INSIGNIFICANT GROWTH Performed at [facility]    Report Status 05/29/2014 FINAL  Final  Urine culture     Status: None   Collection Time: 05/30/14  1:40 PM  Result Value Ref Range Status   Specimen Description URINE, CATHETERIZED  Final   Special Requests NONE  Final   Culture  Setup Time  Final    05/30/2014 14:10 Performed at [place] Performed at [facility]   Final   Culture NO GROWTH Performed at [facility]   Final   Report Status 05/31/2014 FINAL  Final     Labs: Basic Metabolic Panel:  Recent Labs Lab 05/27/14 1430  05/28/14 0449 05/29/14 0450  05/30/14 0318 06/01/14 0344  NA 136*  --  140 142 139 134*  K 5.9*  < > 4.4 3.8 3.9 4.2  CL 95*  --  101 101 98 92*  CO2 27  --  26 28 30 29   GLUCOSE 149*  --  158* 119* 117* 233*  BUN 57*  --  48* 32* 31* 39*  CREATININE 1.10  --  0.97 0.93 0.80 0.89  CALCIUM 9.2  --  8.5 8.6 8.4 8.6  < > = values in this interval not displayed. Liver Function Tests:  Recent Labs Lab 05/27/14 1430 05/29/14 0450  AST 32 23  ALT 27 29  ALKPHOS 66 68  BILITOT 0.3 0.3  PROT 7.3 6.0  ALBUMIN 2.9* 2.4*   No results for input(s): LIPASE, AMYLASE in the last 168 hours. No results for input(s): AMMONIA in the last 168 hours. CBC:  Recent Labs Lab 05/27/14 1517 05/28/14 0449 05/29/14 0450 05/30/14 0318 06/01/14 0344  WBC 5.4 5.3 5.1 5.1 10.5  NEUTROABS 3.7  --   --   --   --   HGB 9.3* 8.7* 8.4* 8.6* 8.5*  HCT 29.2* 28.1* 26.9* 27.2* 26.7*  MCV 93.9 94.6 94.7 95.8 91.8  PLT 218 220 210 220 211   Cardiac Enzymes:  Recent Labs Lab 05/30/14 0318  CKTOTAL 29   BNP: Invalid input(s): POCBNP CBG:  Recent Labs Lab 05/28/14 2004 06/01/14 1629 06/01/14 2157 06/02/14 0752 06/02/14 1146  GLUCAP 220* 383* 215* 226* 233*    Time coordinating discharge:  Greater than 30 minutes  Signed:  Rosaline Ezekiel, DO [facility] Pager: [phone] 06/02/2014, 12:59 PM

## 2014-06-02 NOTE — Care Management Note (Signed)
CARE MANAGEMENT NOTE 06/02/2014  Patient:  [name], [name]   Account Number:  [id number]  Date Initiated:  05/29/2014  Documentation initiated by:  Coreon Simkins  Subjective/Objective Assessment:   CM following for progression and d/c planning.     Action/Plan:   05/30/2015 Met with pt re d/c needs, pt states that was using [facility] service and wishes to continue with "frankie" as his therapist. [facility] notified.  06/01/2014 Pt now plan to d/c to SNF.   Anticipated DC Date:  06/02/2014   Anticipated DC Plan:  SKILLED NURSING FACILITY         Choice offered to / List presented to:  C-1 Patient        [place] arranged  HH-1 RN  [place].   Status of service:  Completed, signed off Medicare Important Message given?  YES (If response is "NO", the following Medicare IM given date fields will be blank) Date Medicare IM given:  06/02/2014 Medicare IM given by:  Emilea Goga Date Additional Medicare IM given:   Additional Medicare IM given by:    Discharge Disposition:  [facility]  Per UR Regulation:    If discussed at Long Length of Stay Meetings, dates discussed:    Comments:  06/02/2014 Spoke with pt and wife re d/c plans , pt is concerned about wound care in the facility, at one point the plan was for the pt to follow up at the [facility], this CM offered to contact the SNF to find out if the pt could be transported to the [facility] for wound care, however the pt wife stated that Dr Hassell Done had stated that he would provide follow up and ongoing wound care. Jasmine Pang RN MPH, case manager, [phone]

## 2014-06-02 NOTE — Progress Notes (Signed)
Physical Therapy Wound Treatment Patient Details  Name: [name] MRN: [id number] Date of Birth: May 13, 1936  Today's Date: 06/02/2014 Time: 1000-1105 Time Calculation (min): 65 min  Subjective  Subjective: So is this wound going to be dealt with at the nursing home. Patient and Family Stated Goals: Get this wound to heal up.  Pain Score:    Wound Assessment  Pressure Ulcer 08/08/13 Stage II -  Partial thickness loss of dermis presenting as a shallow open ulcer with a red, pink wound bed without slough. (Active)     Pressure Ulcer sacrum red blanchable (Active)  Dressing Type Gauze (Comment);Silicone dressing;Foam 06/02/2014 12:00 PM  Dressing Clean;Dry;Intact 06/02/2014 12:00 PM  Dressing Change Frequency Twice a day 06/02/2014 12:00 PM  State of Healing Eschar 06/02/2014 12:00 PM  Site / Wound Assessment Bleeding;Yellow;Pale;Pink 06/02/2014 12:00 PM  % Wound base Red or Granulating 10% 06/02/2014 12:00 PM  % Wound base Yellow 90% 06/02/2014 12:00 PM  Peri-wound Assessment Intact 06/02/2014 12:00 PM  Wound Length (cm) 3.8 cm 06/01/2014 11:14 AM  Wound Width (cm) 2 cm 06/01/2014 11:14 AM  Wound Depth (cm) 2.5 cm 06/01/2014 11:14 AM  Undermining (cm) 2.0 at 6 oclock 06/01/2014 11:14 AM  Margins Unattached edges (unapproximated) 06/02/2014 12:00 PM  Drainage Amount Minimal 06/02/2014 12:00 PM  Drainage Description Serosanguineous 06/02/2014 12:00 PM  Treatment Cleansed;Debridement (Selective);Hydrotherapy (Pulse lavage);Packing (Saline gauze) 06/02/2014 12:00 PM     Wound / Incision (Open or Dehisced) 05/18/14 Other (Comment) Sacrum Mid Debrided Non-healing sacral wound (Active)  Dressing Type Gauze (Comment);Moist to dry 05/31/2014  9:39 PM  Dressing Changed Changed 05/31/2014  9:39 PM  Dressing Status Clean;Dry;Intact 06/02/2014  8:26 AM  Dressing Change Frequency Twice a day 06/02/2014  8:26 AM  Site / Wound Assessment Red 05/31/2014  5:00 PM  Margins Unattached edges (unapproximated)  05/31/2014  9:39 PM  Closure Sutures 05/30/2014 10:45 AM  Drainage Amount Minimal 05/31/2014  9:39 PM  Drainage Description Serosanguineous 05/31/2014  9:39 PM  Treatment Cleansed;Other (Comment) 05/31/2014  9:39 PM     Wound / Incision (Open or Dehisced) Other (Comment) Arm Right;Lower;Lateral (Active)  Dressing Type Foam 06/02/2014  8:26 AM  Dressing Status Clean;Dry;Intact 06/02/2014  8:26 AM  Dressing Change Frequency Every 5 days 06/02/2014  8:26 AM   Hydrotherapy Pulsed lavage therapy - wound location: sacrum Pulsed Lavage with Suction (psi): 8 psi Pulsed Lavage with Suction - Normal Saline Used: 1000 mL Pulsed Lavage Tip: Tip with splash shield Selective Debridement Selective Debridement - Location: sacrum Selective Debridement - Tools Used: Forceps;Scissors Selective Debridement - Tissue Removed: eschar, fibrin   Wound Assessment and Plan  Wound Therapy - Assess/Plan/Recommendations Wound Therapy - Clinical Statement: Pt can benefit from hydrotherapy and selective debridement to take this wound from chronic to more acute to promote progress toward healing. Wound Therapy - Functional Problem List: deconditioned Factors Delaying/Impairing Wound Healing: Infection - systemic/local;Altered sensation Hydrotherapy Plan: Debridement;Dressing change;Patient/family education;Pulsatile lavage with suction Wound Therapy - Frequency: 6X / week Wound Therapy - Follow Up Recommendations: [place] Wound Plan: see above  Wound Therapy Goals- Improve the function of patient's integumentary system by progressing the wound(s) through the phases of wound healing (inflammation - proliferation - remodeling) by: Decrease Necrotic Tissue - Progress: Progressing toward goal Increase Granulation Tissue - Progress: Progressing toward goal  Goals will be updated until maximal potential achieved or discharge criteria met.  Discharge criteria: when goals achieved, discharge from hospital, MD  decision/surgical intervention, no progress towards goals, refusal/missing  three consecutive treatments without notification or medical reason.  GP     Kelyn Ponciano, Tessie Fass 06/02/2014, 12:12 PM 06/02/2014  [name], [place] [phone]  (pager)

## 2014-06-03 ENCOUNTER — Encounter (HOSPITAL_COMMUNITY): Payer: Self-pay | Admitting: Emergency Medicine

## 2014-06-03 ENCOUNTER — Inpatient Hospital Stay (HOSPITAL_COMMUNITY)
Admission: EM | Admit: 2014-06-03 | Discharge: 2014-06-09 | DRG: 291 | Disposition: A | Discharge status: Home / Self Care | Payer: Medicare Other | Attending: Internal Medicine | Admitting: Internal Medicine

## 2014-06-03 ENCOUNTER — Emergency Department (HOSPITAL_COMMUNITY): Payer: Medicare Other

## 2014-06-03 DIAGNOSIS — I739 Peripheral vascular disease, unspecified: Secondary | ICD-10-CM | POA: Diagnosis present

## 2014-06-03 DIAGNOSIS — J9620 Acute and chronic respiratory failure, unspecified whether with hypoxia or hypercapnia: Secondary | ICD-10-CM | POA: Diagnosis present

## 2014-06-03 DIAGNOSIS — E785 Hyperlipidemia, unspecified: Secondary | ICD-10-CM | POA: Diagnosis present

## 2014-06-03 DIAGNOSIS — N319 Neuromuscular dysfunction of bladder, unspecified: Secondary | ICD-10-CM | POA: Diagnosis present

## 2014-06-03 DIAGNOSIS — L89159 Pressure ulcer of sacral region, unspecified stage: Secondary | ICD-10-CM | POA: Diagnosis present

## 2014-06-03 DIAGNOSIS — R54 Age-related physical debility: Secondary | ICD-10-CM | POA: Diagnosis present

## 2014-06-03 DIAGNOSIS — R0602 Shortness of breath: Secondary | ICD-10-CM | POA: Diagnosis present

## 2014-06-03 DIAGNOSIS — I2510 Atherosclerotic heart disease of native coronary artery without angina pectoris: Secondary | ICD-10-CM | POA: Diagnosis present

## 2014-06-03 DIAGNOSIS — L89150 Pressure ulcer of sacral region, unstageable: Secondary | ICD-10-CM | POA: Diagnosis present

## 2014-06-03 DIAGNOSIS — R0600 Dyspnea, unspecified: Secondary | ICD-10-CM

## 2014-06-03 DIAGNOSIS — Z85038 Personal history of other malignant neoplasm of large intestine: Secondary | ICD-10-CM | POA: Diagnosis not present

## 2014-06-03 DIAGNOSIS — Y95 Nosocomial condition: Secondary | ICD-10-CM | POA: Diagnosis present

## 2014-06-03 DIAGNOSIS — R531 Weakness: Secondary | ICD-10-CM

## 2014-06-03 DIAGNOSIS — Z9842 Cataract extraction status, left eye: Secondary | ICD-10-CM | POA: Diagnosis not present

## 2014-06-03 DIAGNOSIS — J9600 Acute respiratory failure, unspecified whether with hypoxia or hypercapnia: Secondary | ICD-10-CM | POA: Diagnosis present

## 2014-06-03 DIAGNOSIS — Z87891 Personal history of nicotine dependence: Secondary | ICD-10-CM

## 2014-06-03 DIAGNOSIS — J441 Chronic obstructive pulmonary disease with (acute) exacerbation: Secondary | ICD-10-CM | POA: Diagnosis present

## 2014-06-03 DIAGNOSIS — J189 Pneumonia, unspecified organism: Secondary | ICD-10-CM | POA: Diagnosis present

## 2014-06-03 DIAGNOSIS — Z6821 Body mass index (BMI) 21.0-21.9, adult: Secondary | ICD-10-CM | POA: Diagnosis not present

## 2014-06-03 DIAGNOSIS — I481 Persistent atrial fibrillation: Secondary | ICD-10-CM | POA: Diagnosis present

## 2014-06-03 DIAGNOSIS — K592 Neurogenic bowel, not elsewhere classified: Secondary | ICD-10-CM | POA: Diagnosis present

## 2014-06-03 DIAGNOSIS — J42 Unspecified chronic bronchitis: Secondary | ICD-10-CM

## 2014-06-03 DIAGNOSIS — Z951 Presence of aortocoronary bypass graft: Secondary | ICD-10-CM | POA: Diagnosis not present

## 2014-06-03 DIAGNOSIS — I5023 Acute on chronic systolic (congestive) heart failure: Principal | ICD-10-CM | POA: Diagnosis present

## 2014-06-03 DIAGNOSIS — E43 Unspecified severe protein-calorie malnutrition: Secondary | ICD-10-CM | POA: Diagnosis present

## 2014-06-03 DIAGNOSIS — Z95810 Presence of automatic (implantable) cardiac defibrillator: Secondary | ICD-10-CM | POA: Diagnosis not present

## 2014-06-03 DIAGNOSIS — Z9841 Cataract extraction status, right eye: Secondary | ICD-10-CM

## 2014-06-03 DIAGNOSIS — Z961 Presence of intraocular lens: Secondary | ICD-10-CM | POA: Diagnosis present

## 2014-06-03 DIAGNOSIS — Z8673 Personal history of transient ischemic attack (TIA), and cerebral infarction without residual deficits: Secondary | ICD-10-CM

## 2014-06-03 DIAGNOSIS — E119 Type 2 diabetes mellitus without complications: Secondary | ICD-10-CM | POA: Diagnosis present

## 2014-06-03 DIAGNOSIS — I10 Essential (primary) hypertension: Secondary | ICD-10-CM | POA: Diagnosis present

## 2014-06-03 DIAGNOSIS — Z66 Do not resuscitate: Secondary | ICD-10-CM | POA: Diagnosis present

## 2014-06-03 DIAGNOSIS — I472 Ventricular tachycardia: Secondary | ICD-10-CM | POA: Diagnosis present

## 2014-06-03 DIAGNOSIS — E876 Hypokalemia: Secondary | ICD-10-CM | POA: Diagnosis present

## 2014-06-03 DIAGNOSIS — Z9981 Dependence on supplemental oxygen: Secondary | ICD-10-CM | POA: Diagnosis not present

## 2014-06-03 DIAGNOSIS — I4819 Other persistent atrial fibrillation: Secondary | ICD-10-CM | POA: Diagnosis present

## 2014-06-03 DIAGNOSIS — I255 Ischemic cardiomyopathy: Secondary | ICD-10-CM | POA: Diagnosis present

## 2014-06-03 DIAGNOSIS — J449 Chronic obstructive pulmonary disease, unspecified: Secondary | ICD-10-CM | POA: Diagnosis present

## 2014-06-03 HISTORY — DX: Methicillin resistant Staphylococcus aureus infection, unspecified site: A49.02

## 2014-06-03 LAB — CBC WITH DIFFERENTIAL/PLATELET
BASOS ABS: 0 10*3/uL (ref 0.0–0.1)
Basophils Relative: 0 % (ref 0–1)
EOS ABS: 0 10*3/uL (ref 0.0–0.7)
Eosinophils Relative: 0 % (ref 0–5)
HCT: 29.4 % — ABNORMAL LOW (ref 39.0–52.0)
HEMOGLOBIN: 9.8 g/dL — AB (ref 13.0–17.0)
Lymphocytes Relative: 4 % — ABNORMAL LOW (ref 12–46)
Lymphs Abs: 0.5 10*3/uL — ABNORMAL LOW (ref 0.7–4.0)
MCH: 31.2 pg (ref 26.0–34.0)
MCHC: 33.3 g/dL (ref 30.0–36.0)
MCV: 93.6 fL (ref 78.0–100.0)
MONOS PCT: 3 % (ref 3–12)
Monocytes Absolute: 0.4 10*3/uL (ref 0.1–1.0)
NEUTROS PCT: 93 % — AB (ref 43–77)
Neutro Abs: 11.7 10*3/uL — ABNORMAL HIGH (ref 1.7–7.7)
Platelets: 165 10*3/uL (ref 150–400)
RBC: 3.14 MIL/uL — ABNORMAL LOW (ref 4.22–5.81)
RDW: 14.1 % (ref 11.5–15.5)
WBC: 12.6 10*3/uL — AB (ref 4.0–10.5)

## 2014-06-03 LAB — COMPREHENSIVE METABOLIC PANEL
ALT: 23 U/L (ref 0–53)
AST: 17 U/L (ref 0–37)
Albumin: 2.7 g/dL — ABNORMAL LOW (ref 3.5–5.2)
Alkaline Phosphatase: 65 U/L (ref 39–117)
Anion gap: 10 (ref 5–15)
BUN: 36 mg/dL — ABNORMAL HIGH (ref 6–23)
CALCIUM: 8.8 mg/dL (ref 8.4–10.5)
CO2: 31 mEq/L (ref 19–32)
Chloride: 95 mEq/L — ABNORMAL LOW (ref 96–112)
Creatinine, Ser: 0.86 mg/dL (ref 0.50–1.35)
GFR calc Af Amer: >90 mL/min (ref >90)
GFR calc non Af Amer: 82 mL/min — ABNORMAL LOW (ref >90)
Glucose, Bld: 212 mg/dL — ABNORMAL HIGH (ref 70–99)
POTASSIUM: 4.9 meq/L (ref 3.7–5.3)
Sodium: 136 mEq/L — ABNORMAL LOW (ref 137–147)
TOTAL PROTEIN: 6.6 g/dL (ref 6.0–8.3)
Total Bilirubin: 0.6 mg/dL (ref 0.3–1.2)

## 2014-06-03 LAB — GLUCOSE, CAPILLARY
GLUCOSE-CAPILLARY: 224 mg/dL — AB (ref 70–99)
GLUCOSE-CAPILLARY: 317 mg/dL — AB (ref 70–99)

## 2014-06-03 LAB — PROTIME-INR
INR: 1.02 (ref 0.00–1.49)
Prothrombin Time: 13.5 seconds (ref 11.6–15.2)

## 2014-06-03 LAB — DIGOXIN LEVEL: DIGOXIN LVL: 0.9 ng/mL (ref 0.8–2.0)

## 2014-06-03 LAB — HEMOGLOBIN A1C
Hgb A1c MFr Bld: 8 % — ABNORMAL HIGH (ref <5.7)
Mean Plasma Glucose: 183 mg/dL — ABNORMAL HIGH (ref <117)

## 2014-06-03 LAB — CULTURE, BLOOD (ROUTINE X 2): Culture: NO GROWTH

## 2014-06-03 LAB — PRO B NATRIURETIC PEPTIDE: Pro B Natriuretic peptide (BNP): 4268 pg/mL — ABNORMAL HIGH (ref 0–450)

## 2014-06-03 LAB — TROPONIN I

## 2014-06-03 MED ORDER — INSULIN ASPART 100 UNIT/ML ~~LOC~~ SOLN
0.0000 [IU] | Freq: Three times a day (TID) | SUBCUTANEOUS | Status: DC
  Administered 2014-06-04: 9 [IU] via SUBCUTANEOUS
  Administered 2014-06-04 (×2): 3 [IU] via SUBCUTANEOUS
  Administered 2014-06-05: 5 [IU] via SUBCUTANEOUS
  Administered 2014-06-05: 9 [IU] via SUBCUTANEOUS
  Administered 2014-06-05: 3 [IU] via SUBCUTANEOUS
  Administered 2014-06-06: 7 [IU] via SUBCUTANEOUS
  Administered 2014-06-06: 5 [IU] via SUBCUTANEOUS
  Administered 2014-06-06: 7 [IU] via SUBCUTANEOUS
  Administered 2014-06-07: 5 [IU] via SUBCUTANEOUS
  Administered 2014-06-07: 9 [IU] via SUBCUTANEOUS
  Administered 2014-06-07 – 2014-06-08 (×2): 2 [IU] via SUBCUTANEOUS
  Administered 2014-06-08: 1 [IU] via SUBCUTANEOUS
  Administered 2014-06-08: 3 [IU] via SUBCUTANEOUS

## 2014-06-03 MED ORDER — DIGOXIN 125 MCG PO TABS: 125.0000 ug | ORAL_TABLET | Freq: Every day | ORAL | Status: DC

## 2014-06-03 MED ORDER — NITROGLYCERIN 0.2 MG/HR TD PT24
0.2000 mg | MEDICATED_PATCH | TRANSDERMAL | Status: DC
  Administered 2014-06-03 – 2014-06-08 (×6): 0.2 mg via TRANSDERMAL
  Filled 2014-06-03 (×7): qty 1

## 2014-06-03 MED ORDER — AMLODIPINE BESYLATE 5 MG PO TABS
5.0000 mg | ORAL_TABLET | Freq: Every day | ORAL | Status: DC
  Administered 2014-06-03 – 2014-06-05 (×3): 5 mg via ORAL
  Filled 2014-06-03 (×4): qty 1

## 2014-06-03 MED ORDER — CETYLPYRIDINIUM CHLORIDE 0.05 % MT LIQD
7.0000 mL | Freq: Two times a day (BID) | OROMUCOSAL | Status: DC
  Administered 2014-06-03 – 2014-06-09 (×11): 7 mL via OROMUCOSAL

## 2014-06-03 MED ORDER — IRBESARTAN 75 MG PO TABS
75.0000 mg | ORAL_TABLET | Freq: Every day | ORAL | Status: DC
  Administered 2014-06-03 – 2014-06-05 (×3): 75 mg via ORAL
  Filled 2014-06-03 (×4): qty 1

## 2014-06-03 MED ORDER — CARVEDILOL 25 MG PO TABS
25.0000 mg | ORAL_TABLET | Freq: Two times a day (BID) | ORAL | Status: DC
  Administered 2014-06-03 – 2014-06-04 (×2): 25 mg via ORAL
  Filled 2014-06-03 (×4): qty 1

## 2014-06-03 MED ORDER — SODIUM CHLORIDE 0.9 % IJ SOLN
3.0000 mL | Freq: Two times a day (BID) | INTRAMUSCULAR | Status: DC
  Administered 2014-06-03 – 2014-06-08 (×8): 3 mL via INTRAVENOUS

## 2014-06-03 MED ORDER — IPRATROPIUM-ALBUTEROL 0.5-2.5 (3) MG/3ML IN SOLN: 3.0000 mL | RESPIRATORY_TRACT | Status: DC

## 2014-06-03 MED ORDER — BUDESONIDE 0.5 MG/2ML IN SUSP
0.5000 mg | Freq: Two times a day (BID) | RESPIRATORY_TRACT | Status: DC
  Administered 2014-06-03 – 2014-06-09 (×10): 0.5 mg via RESPIRATORY_TRACT
  Filled 2014-06-03 (×17): qty 2

## 2014-06-03 MED ORDER — GUAIFENESIN ER 600 MG PO TB12
600.0000 mg | ORAL_TABLET | Freq: Two times a day (BID) | ORAL | Status: DC
  Administered 2014-06-03 – 2014-06-05 (×4): 600 mg via ORAL
  Filled 2014-06-03 (×5): qty 1

## 2014-06-03 MED ORDER — METHYLPREDNISOLONE SODIUM SUCC 125 MG IJ SOLR
60.0000 mg | Freq: Two times a day (BID) | INTRAMUSCULAR | Status: DC
  Administered 2014-06-03 – 2014-06-06 (×6): 60 mg via INTRAVENOUS
  Filled 2014-06-03: qty 2
  Filled 2014-06-03 (×5): qty 0.96
  Filled 2014-06-03: qty 2
  Filled 2014-06-03: qty 0.96

## 2014-06-03 MED ORDER — MUPIROCIN 2 % EX OINT
1.0000 "application " | TOPICAL_OINTMENT | Freq: Two times a day (BID) | CUTANEOUS | Status: DC | PRN
  Filled 2014-06-03: qty 22

## 2014-06-03 MED ORDER — POTASSIUM CHLORIDE CRYS ER 20 MEQ PO TBCR
20.0000 meq | EXTENDED_RELEASE_TABLET | Freq: Every day | ORAL | Status: DC
  Administered 2014-06-03 – 2014-06-09 (×7): 20 meq via ORAL
  Filled 2014-06-03 (×7): qty 1

## 2014-06-03 MED ORDER — SODIUM CHLORIDE 0.9 % IV SOLN: 250.0000 mL | INTRAVENOUS | Status: DC | PRN

## 2014-06-03 MED ORDER — SODIUM CHLORIDE 0.9 % IJ SOLN
3.0000 mL | INTRAMUSCULAR | Status: DC | PRN
  Administered 2014-06-04: 3 mL via INTRAVENOUS
  Filled 2014-06-03: qty 3

## 2014-06-03 MED ORDER — MEGESTROL ACETATE 20 MG PO TABS
20.0000 mg | ORAL_TABLET | Freq: Two times a day (BID) | ORAL | Status: DC
  Administered 2014-06-03 – 2014-06-09 (×12): 20 mg via ORAL
  Filled 2014-06-03 (×13): qty 1

## 2014-06-03 MED ORDER — LEVOFLOXACIN IN D5W 500 MG/100ML IV SOLN
500.0000 mg | INTRAVENOUS | Status: DC
  Administered 2014-06-03: 500 mg via INTRAVENOUS
  Filled 2014-06-03 (×3): qty 100

## 2014-06-03 MED ORDER — HYDROCODONE-ACETAMINOPHEN 2.5-500 MG PO TABS: 1.0000 | ORAL_TABLET | Freq: Four times a day (QID) | ORAL | Status: DC | PRN

## 2014-06-03 MED ORDER — ALBUTEROL SULFATE (2.5 MG/3ML) 0.083% IN NEBU
2.5000 mg | INHALATION_SOLUTION | Freq: Four times a day (QID) | RESPIRATORY_TRACT | Status: DC | PRN
  Administered 2014-06-03 – 2014-06-04 (×2): 2.5 mg via RESPIRATORY_TRACT
  Filled 2014-06-03 (×2): qty 3

## 2014-06-03 MED ORDER — HYDROCODONE-ACETAMINOPHEN 5-325 MG PO TABS
1.0000 | ORAL_TABLET | Freq: Four times a day (QID) | ORAL | Status: DC | PRN
  Administered 2014-06-06 – 2014-06-09 (×3): 1 via ORAL
  Filled 2014-06-03 (×3): qty 1

## 2014-06-03 MED ORDER — COLLAGENASE 250 UNIT/GM EX OINT
TOPICAL_OINTMENT | Freq: Two times a day (BID) | CUTANEOUS | Status: DC
  Administered 2014-06-03: 21:00:00 via TOPICAL
  Filled 2014-06-03: qty 30

## 2014-06-03 MED ORDER — FUROSEMIDE 10 MG/ML IJ SOLN
40.0000 mg | Freq: Two times a day (BID) | INTRAMUSCULAR | Status: DC
  Administered 2014-06-03: 40 mg via INTRAVENOUS
  Filled 2014-06-03 (×3): qty 4

## 2014-06-03 MED ORDER — ACETAMINOPHEN 650 MG RE SUPP: 650.0000 mg | Freq: Four times a day (QID) | RECTAL | Status: DC | PRN

## 2014-06-03 MED ORDER — SIMVASTATIN 20 MG PO TABS
20.0000 mg | ORAL_TABLET | Freq: Every day | ORAL | Status: DC
  Administered 2014-06-03 – 2014-06-04 (×2): 20 mg via ORAL
  Filled 2014-06-03 (×3): qty 1

## 2014-06-03 MED ORDER — NITROGLYCERIN 0.4 MG SL SUBL: 0.4000 mg | SUBLINGUAL_TABLET | SUBLINGUAL | Status: DC | PRN

## 2014-06-03 MED ORDER — FERROUS SULFATE 325 (65 FE) MG PO TABS
325.0000 mg | ORAL_TABLET | Freq: Every day | ORAL | Status: DC
  Administered 2014-06-03 – 2014-06-08 (×6): 325 mg via ORAL
  Filled 2014-06-03 (×7): qty 1

## 2014-06-03 MED ORDER — DIGOXIN 125 MCG PO TABS
0.1250 mg | ORAL_TABLET | Freq: Every morning | ORAL | Status: DC
  Administered 2014-06-04 – 2014-06-09 (×6): 0.125 mg via ORAL
  Filled 2014-06-03 (×6): qty 1

## 2014-06-03 MED ORDER — ALBUTEROL SULFATE (2.5 MG/3ML) 0.083% IN NEBU: 2.5000 mg | INHALATION_SOLUTION | Freq: Four times a day (QID) | RESPIRATORY_TRACT | Status: DC

## 2014-06-03 MED ORDER — ALBUTEROL SULFATE HFA 108 (90 BASE) MCG/ACT IN AERS: 2.0000 | INHALATION_SPRAY | Freq: Four times a day (QID) | RESPIRATORY_TRACT | Status: DC | PRN

## 2014-06-03 MED ORDER — ENOXAPARIN SODIUM 40 MG/0.4ML ~~LOC~~ SOLN
40.0000 mg | SUBCUTANEOUS | Status: DC
  Administered 2014-06-03 – 2014-06-08 (×6): 40 mg via SUBCUTANEOUS
  Filled 2014-06-03 (×7): qty 0.4

## 2014-06-03 MED ORDER — ASPIRIN EC 325 MG PO TBEC
325.0000 mg | DELAYED_RELEASE_TABLET | Freq: Every day | ORAL | Status: DC
  Administered 2014-06-03 – 2014-06-08 (×6): 325 mg via ORAL
  Filled 2014-06-03 (×7): qty 1

## 2014-06-03 MED ORDER — TIOTROPIUM BROMIDE MONOHYDRATE 18 MCG IN CAPS
18.0000 ug | ORAL_CAPSULE | Freq: Every day | RESPIRATORY_TRACT | Status: DC
  Filled 2014-06-03: qty 5

## 2014-06-03 MED ORDER — ENSURE COMPLETE PO LIQD
237.0000 mL | Freq: Two times a day (BID) | ORAL | Status: DC
  Administered 2014-06-08: 237 mL via ORAL

## 2014-06-03 MED ORDER — ACETAMINOPHEN 325 MG PO TABS: 650.0000 mg | ORAL_TABLET | Freq: Four times a day (QID) | ORAL | Status: DC | PRN

## 2014-06-03 NOTE — ED Provider Notes (Signed)
CSN: [id number]     Arrival date & time 06/03/14  1301 History   First MD Initiated Contact with Patient 06/03/14 1308     Chief Complaint  Patient presents with  . Shortness of Breath     (Consider location/radiation/quality/duration/timing/severity/associated sxs/prior Treatment) Patient is a 78 y.o. male presenting with shortness of breath. The history is provided by the patient (the pt complains of sob.   he has two neb tx and is still sob).  Shortness of Breath Severity:  Moderate Onset quality:  Sudden Timing:  Constant Progression:  Worsening Chronicity:  Recurrent Context: activity   Associated symptoms: no abdominal pain, no chest pain, no cough, no headaches and no rash     Past Medical History  Diagnosis Date  . Hypokalemia   . PVD (peripheral vascular disease)   . HTN (hypertension)   . CHF (congestive heart failure)   . Ventricular tachycardia   . Ischemic heart disease, chronic   . Dyslipidemia   . CAD (coronary artery disease)   . On home oxygen therapy     "~ 2.5L; pretty much 24/7" (05/27/2014)  . Pacemaker   . Self-catheterizes urinary bladder     since back surgery.  6- 7 times a day. (05/27/2014)  . Difficult intubation     pt states "not aware of difficult intubation, never told"  . Heart murmur     dx 1974  . Shortness of breath   . Head injury, closed, with brief LOC 2013  . Neurogenic bladder     since back surgery - self caths  . Neurogenic bowel     digitally stimulates daily.  Marland Kitchen COPD (chronic obstructive pulmonary disease)      PT USES OXYGEN 24 HRS A DAY - 2 L NASAL CANNULA  . Emphysema   . Anemia     SERUM MONOCLONAL IgM KAPPA PROTEIN - NO CLINICAL EVIDENCE FOR PROGRESSION TO MULTIPLE MYELOMA OR OTHER PROLIFERATIVE DISORDER - PER OFFICE NOTE DR. SHERRILL DATED 12/30/13  . Pneumonia X 2  . Borderline type 2 diabetes mellitus   . AICD (automatic cardioverter/defibrillator) present   . TIA (transient ischemic attack) 1980's    "they say  I did; I don't believe it"  . Colon cancer   . MRSA infection    Past Surgical History  Procedure Laterality Date  . Lumbar disc surgery  X 2  . Iliac artery stent Left   . Bi-ventricular implantable cardioverter defibrillator  (crt-d)  04-10-08    implantation of Medtronic Concerto Bi-V ICD [; [name] MD  . Colonoscopy    . Polypectomy    . Insertion of iliac stent Right 06/04/2012    external   . Cardiac catheterization    . Insert / replace / remove pacemaker    . Endarterectomy femoral  06/28/2012    Procedure: ENDARTERECTOMY FEMORAL;  Surgeon: Nada Libman, MD;  Location: [facility] OR;  Service: Vascular;  Laterality: Left;  . Patch angioplasty  06/28/2012    Procedure: PATCH ANGIOPLASTY;  Surgeon: Nada Libman, MD;  Location: [facility] OR;  Service: Vascular;  Laterality: Left;  Using 0.8 cm x 15.2 cm Hemashield Patch.  . Colon surgery  03/2004  . Cataract extraction w/ intraocular lens  implant, bilateral Bilateral   . Cholecystectomy    . Coronary artery bypass graft  1998    x3 vessels  . Femoral-popliteal bypass graft Right 03/28/2013    Procedure: BYPASS GRAFT FEMORAL-BELOW KNEE POPLITEAL ARTERY Using 13mm x 80  cm Propaten Graft ,With Right Common Femoral Artery Endarterectomy.;  Surgeon: Serafina Mitchell, MD;  Location: [facility] OR;  Service: Vascular;  Laterality: Right;  . Debridment of decubitus ulcer N/A 05/18/2014    Procedure: EXCISION AND CLOSURE OF DECUBITUS ULCER 5CM;  Surgeon: Pedro Earls, MD;  Location: [facility] ORS;  Service: General;  Laterality: N/A;  . Back surgery    . Posterior lumbar fusion  X 1   Family History  Problem Relation Age of Onset  . Heart failure Father   . Diabetes Father   . Heart disease Father   . Hyperlipidemia Father   . Hypertension Father   . Heart disease Mother     before age 21  . Hyperlipidemia Mother   . Hypertension Mother    History  Substance Use Topics  . Smoking status: Former Smoker -- 3.00 packs/day for 48 years    Types:  Cigarettes    Quit date: 07/03/2002  . Smokeless tobacco: Never Used  . Alcohol Use: Yes     Comment: 05/27/2014 "a beer q 6 months or so"    Review of Systems  Constitutional: Negative for appetite change and fatigue.  HENT: Negative for congestion, ear discharge and sinus pressure.   Eyes: Negative for discharge.  Respiratory: Positive for shortness of breath. Negative for cough.   Cardiovascular: Negative for chest pain.  Gastrointestinal: Negative for abdominal pain and diarrhea.  Genitourinary: Negative for frequency and hematuria.  Musculoskeletal: Negative for back pain.  Skin: Negative for rash.  Neurological: Negative for seizures and headaches.  Psychiatric/Behavioral: Negative for hallucinations.      Allergies  Review of patient's allergies indicates no known allergies.  Home Medications   Prior to Admission medications   Medication Sig Start Date End Date Taking? Authorizing Provider  albuterol (PROVENTIL HFA;VENTOLIN HFA) 108 (90 BASE) MCG/ACT inhaler Inhale 2 puffs into the lungs every 6 (six) hours as needed for wheezing or shortness of breath.  10/02/12  Yes Kathee Delton, MD  albuterol (PROVENTIL) (2.5 MG/3ML) 0.083% nebulizer solution Take 2.5 mg by nebulization 4 (four) times daily.    Yes Historical Provider, MD  amLODipine (NORVASC) 5 MG tablet TAKE 1 TABLET DAILY   Yes Belva Crome III, MD  aspirin EC 325 MG tablet Take 325 mg by mouth at bedtime.   Yes Historical Provider, MD  budesonide (PULMICORT) 0.5 MG/2ML nebulizer solution Take 0.5 mg by nebulization 2 (two) times daily.    Yes Historical Provider, MD  candesartan (ATACAND) 8 MG tablet Take 1 tablet (8 mg total) by mouth every morning. 06/02/14  Yes Orson Eva, MD  carvedilol (COREG) 25 MG tablet Take 25 mg by mouth 2 (two) times daily with a meal.   Yes Historical Provider, MD  collagenase (SANTYL) ointment Apply topically 2 (two) times daily. 06/02/14  Yes Orson Eva, MD  digoxin (LANOXIN) 0.125 MG  tablet Take 0.125 mg by mouth every morning.   Yes Historical Provider, MD  feeding supplement, ENSURE COMPLETE, (ENSURE COMPLETE) LIQD Take 237 mLs by mouth 2 (two) times daily between meals. 05/29/14  Yes Orson Eva, MD  ferrous sulfate 325 (65 FE) MG tablet Take 325 mg by mouth at bedtime.    Yes Historical Provider, MD  furosemide (LASIX) 40 MG tablet TAKE 2 TABLETS IN THE MORNING AND 1 TABLET IN THE EVENING 04/23/14  Yes Kathee Delton, MD  guaiFENesin (MUCINEX) 600 MG 12 hr tablet Take 600 mg by mouth 2 (two) times daily as  needed. congestion   Yes Historical Provider, MD  HYDROcodone-acetaminophen (VICODIN) 2.5-500 MG per tablet Take 1 tablet by mouth every 6 (six) hours as needed for pain.   Yes Historical Provider, MD  KLOR-CON M20 20 MEQ tablet TAKE 1 TABLET DAILY 02/17/14  Yes Kathee Delton, MD  LANOXIN 125 MCG tablet TAKE 1 TABLET DAILY 04/30/14  Yes Evans Lance, MD  levofloxacin (LEVAQUIN) 500 MG tablet Take 1 tablet (500 mg total) by mouth daily. 06/02/14  Yes Orson Eva, MD  mupirocin ointment (BACTROBAN) 2 % Apply 1 application topically 2 (two) times daily as needed (Cuts and bruises).   Yes Historical Provider, MD  nitroGLYCERIN (NITRODUR - DOSED IN MG/24 HR) 0.2 mg/hr patch Place 1 patch onto the skin daily.   Yes Historical Provider, MD  nitroGLYCERIN (NITROSTAT) 0.4 MG SL tablet Place 0.4 mg under the tongue every 5 (five) minutes as needed for chest pain.   Yes Historical Provider, MD  predniSONE (DELTASONE) 10 MG tablet Take 1 tablet (10 mg total) by mouth daily. Resume after 9 day prednisone taper 06/02/14  Yes Orson Eva, MD  predniSONE (DELTASONE) 20 MG tablet Take 3 tablets (60 mg total) by mouth daily with breakfast. X 3 days, then 2 tablets ($RemoveBe'40mg'etAqvGetQ$ ) daily with breakfastx 3 days, then 1 tablet ($RemoveB'20mg'lIRQQMSW$ ) daily x 3 days 06/03/14  Yes Orson Eva, MD  PRESCRIPTION MEDICATION Uses 1L-2L of home Oxygen   Yes Historical Provider, MD  PROVENTIL HFA 108 (90 BASE) MCG/ACT inhaler INHALE 1 TO  2 PUFFS INTO THE LUNGS EVERY 6 HOURS AS NEEDED FOR SHORTNESS OF BREATH 02/10/14  Yes Kathee Delton, MD  simvastatin (ZOCOR) 20 MG tablet Take 20 mg by mouth at bedtime.    Yes Historical Provider, MD  tiotropium (SPIRIVA) 18 MCG inhalation capsule Place 18 mcg into inhaler and inhale daily.    Yes Historical Provider, MD  acetaminophen (TYLENOL) 325 MG tablet Take 2 tablets (650 mg total) by mouth every 4 (four) hours as needed for headache or mild pain. Patient not taking: Reported on 05/27/2014 08/09/13   Doreene Burke Kilroy, PA-C   BP 122/50 mmHg  Pulse 89  Temp(Src) 97.7 F (36.5 C) (Axillary)  Resp 16  SpO2 98% Physical Exam  Constitutional: He is oriented to person, place, and time. He appears well-developed.  HENT:  Head: Normocephalic.  Eyes: Conjunctivae and EOM are normal. No scleral icterus.  Neck: Neck supple. No thyromegaly present.  Cardiovascular: Normal rate and regular rhythm.  Exam reveals no gallop and no friction rub.   No murmur heard. Pulmonary/Chest: No stridor. He has wheezes. He has no rales. He exhibits no tenderness.  Abdominal: He exhibits no distension. There is no tenderness. There is no rebound.  Musculoskeletal: Normal range of motion. He exhibits no edema.  Lymphadenopathy:    He has no cervical adenopathy.  Neurological: He is oriented to person, place, and time. He exhibits normal muscle tone. Coordination normal.  Skin: No rash noted. No erythema.  Psychiatric: He has a normal mood and affect. His behavior is normal.    ED Course  Procedures (including critical care time) Labs Review Labs Reviewed  CBC WITH DIFFERENTIAL - Abnormal; Notable for the following:    WBC 12.6 (*)    RBC 3.14 (*)    Hemoglobin 9.8 (*)    HCT 29.4 (*)    Neutrophils Relative % 93 (*)    Lymphocytes Relative 4 (*)    Neutro Abs 11.7 (*)  Lymphs Abs 0.5 (*)    All other components within normal limits  COMPREHENSIVE METABOLIC PANEL - Abnormal; Notable for the following:     Sodium 136 (*)    Chloride 95 (*)    Glucose, Bld 212 (*)    BUN 36 (*)    Albumin 2.7 (*)    GFR calc non Af Amer 82 (*)    All other components within normal limits  PRO B NATRIURETIC PEPTIDE - Abnormal; Notable for the following:    Pro B Natriuretic peptide (BNP) 4268.0 (*)    All other components within normal limits  TROPONIN I  DIGOXIN LEVEL  PROTIME-INR    Imaging Review Dg Chest Port 1 View  06/03/2014   CLINICAL DATA:  Shortness of breath  EXAM: PORTABLE CHEST - 1 VIEW  COMPARISON:  Portable chest x-ray of 05/29/2014  FINDINGS: The lungs are clear but slightly hyperaerated. Mediastinal and hilar contours are unchanged. The heart is enlarged and stable with AICD leads noted.  IMPRESSION: Stable cardiomegaly.  No active lung disease.   Electronically Signed   By: Ivar Drape M.D.   On: 06/03/2014 13:45     EKG Interpretation   Date/Time:  Wednesday June 03 2014 13:16:22 EST Ventricular Rate:  82 PR Interval:    QRS Duration: 163 QT Interval:  399 QTC Calculation: 466 R Axis:   -117 Text Interpretation:  Afib/flut and V-paced complexes No further analysis  attempted due to paced rhythm Confirmed by Champayne Kocian  MD, Jersey Espinoza [phone]) on  06/03/2014 3:27:00 PM      MDM   Final diagnoses:  Dyspnea  Chronic bronchitis, unspecified chronic bronchitis type    admit    Maudry Diego, MD 06/03/14 1527

## 2014-06-03 NOTE — ED Notes (Signed)
MD Zammit placed pt on 4L [place].

## 2014-06-03 NOTE — ED Notes (Signed)
Notified MD Zammit about pt's consistent low BP readings.

## 2014-06-03 NOTE — ED Notes (Signed)
MD Zammit at bedside. 

## 2014-06-03 NOTE — ED Notes (Addendum)
Pt from [facility], brought in by EMS c/o of SOB and wheezing. Pt was just d/c at 2300 from [facility] last night for exacerbation of COPD. Since d/c, pt states he has been SOB and has been experiencing a productive cough with yellow phlegm. Per EMS, pt was given Prednisone and Solu-medrol IM at [facility]. EMS gave 2 DUONEB tx en route. Pt also states that at 0900 this morning he had an episode of CP, took 1 nitro with relief. Denies CP at this time. EMS also states pt has an ulcer on his buttock that has tested positive for MRSA. Pt has hx of CHF and COPD. Pt normally on 2.5L O2 at home. Pt O2 sats 100% on 6L face mask in triage. Pt denies pain. NAD noted. AO x4.

## 2014-06-03 NOTE — ED Notes (Signed)
Placed pt on 3L [place] per MD Zammit order. Pt tolerating well. 02 sats 98%.

## 2014-06-03 NOTE — H&P (Signed)
[facility] History and Physical  BASIM BARTNIK OMV:[id number] DOB: Sep 25, 1935 DOA: 06/03/2014  Referring physician:  PCP: Dorian Heckle, MD  Specialists:   Chief Complaint: Shortness of breath  HPI: [name] is a 78 y.o. male  With a history of CHF with an EF of 20-25%, pacemaker/I CD placement, diabetes mellitus, COPD, presented to the emergency department from [place] facility for shortness of breath. Patient was recently admitted and discharged 05/27/2014 to 06/02/2014 for acute on chronic respiratory failure.  Patient states that he was not as short of breath yesterday however became very short of breath at the nursing home today. Patient also stated that his Lasix had been changed recently after discharge. He feels that his legs have been swelling more than usual. Patient has been using his nebulizer treatments as well as taking his medications. Patient has continued to cough up green sputum. Patient was brought to the emergency department by EMS. He had been given 3 DuoNeb treatments by the emergency department. Chest x-ray showed no active cardiopulmonary process. [facility] was called for admission.  Review of Systems:  Constitutional: Denies fever, chills, diaphoresis, appetite change and fatigue.  HEENT: Denies photophobia, eye pain, redness, hearing loss, ear pain, congestion, sore throat, rhinorrhea, sneezing, mouth sores, trouble swallowing, neck pain, neck stiffness and tinnitus.   Respiratory: SOB and cough.  Cardiovascular: Denies chest pain, palpitations.  +leg swelling Gastrointestinal: Denies nausea, vomiting, abdominal pain, diarrhea, constipation, blood in stool and abdominal distention.  Genitourinary: Denies dysuria, urgency, frequency, hematuria, flank pain and difficulty urinating.  Musculoskeletal: Denies myalgias, back pain, joint swelling, arthralgias and gait problem.  Skin: Denies pallor, rash and wound.  Neurological: Denies dizziness,  seizures, syncope, weakness, light-headedness, numbness and headaches.  Hematological: Denies adenopathy. Easy bruising, personal or family bleeding history  Psychiatric/Behavioral: Denies suicidal ideation, mood changes, confusion, nervousness, sleep disturbance and agitation  Past Medical History  Diagnosis Date  . Hypokalemia   . PVD (peripheral vascular disease)   . HTN (hypertension)   . CHF (congestive heart failure)   . Ventricular tachycardia   . Ischemic heart disease, chronic   . Dyslipidemia   . CAD (coronary artery disease)   . On home oxygen therapy     "~ 2.5L; pretty much 24/7" (05/27/2014)  . Pacemaker   . Self-catheterizes urinary bladder     since back surgery.  6- 7 times a day. (05/27/2014)  . Difficult intubation     pt states "not aware of difficult intubation, never told"  . Heart murmur     dx 1974  . Shortness of breath   . Head injury, closed, with brief LOC 2013  . Neurogenic bladder     since back surgery - self caths  . Neurogenic bowel     digitally stimulates daily.  Marland Kitchen COPD (chronic obstructive pulmonary disease)      PT USES OXYGEN 24 HRS A DAY - 2 L NASAL CANNULA  . Emphysema   . Anemia     SERUM MONOCLONAL IgM KAPPA PROTEIN - NO CLINICAL EVIDENCE FOR PROGRESSION TO MULTIPLE MYELOMA OR OTHER PROLIFERATIVE DISORDER - PER OFFICE NOTE DR. SHERRILL DATED 12/30/13  . Pneumonia X 2  . Borderline type 2 diabetes mellitus   . AICD (automatic cardioverter/defibrillator) present   . TIA (transient ischemic attack) 1980's    "they say I did; I don't believe it"  . Colon cancer   . MRSA infection    Past Surgical History  Procedure Laterality Date  .  Lumbar disc surgery  X 2  . Iliac artery stent Left   . Bi-ventricular implantable cardioverter defibrillator  (crt-d)  04-10-08    implantation of Medtronic Concerto Bi-V ICD [; Cristopher Peru MD  . Colonoscopy    . Polypectomy    . Insertion of iliac stent Right 06/04/2012    external   . Cardiac  catheterization    . Insert / replace / remove pacemaker    . Endarterectomy femoral  06/28/2012    Procedure: ENDARTERECTOMY FEMORAL;  Surgeon: Serafina Mitchell, MD;  Location: [facility];  Service: Vascular;  Laterality: Left;  . Patch angioplasty  06/28/2012    Procedure: PATCH ANGIOPLASTY;  Surgeon: Serafina Mitchell, MD;  Location: [facility] OR;  Service: Vascular;  Laterality: Left;  Using 0.8 cm x 15.2 cm Hemashield Patch.  . Colon surgery  03/2004  . Cataract extraction w/ intraocular lens  implant, bilateral Bilateral   . Cholecystectomy    . Coronary artery bypass graft  1998    x3 vessels  . Femoral-popliteal bypass graft Right 03/28/2013    Procedure: BYPASS GRAFT FEMORAL-BELOW KNEE POPLITEAL ARTERY Using 86mm x 80 cm Propaten Graft ,With Right Common Femoral Artery Endarterectomy.;  Surgeon: Serafina Mitchell, MD;  Location: [facility] OR;  Service: Vascular;  Laterality: Right;  . Debridment of decubitus ulcer N/A 05/18/2014    Procedure: EXCISION AND CLOSURE OF DECUBITUS ULCER 5CM;  Surgeon: Pedro Earls, MD;  Location: [facility] ORS;  Service: General;  Laterality: N/A;  . Back surgery    . Posterior lumbar fusion  X 1   Social History:  reports that he quit smoking about 11 years ago. His smoking use included Cigarettes. He has a 144 pack-year smoking history. He has never used smokeless tobacco. He reports that he drinks alcohol. He reports that he does not use illicit drugs.   No Known Allergies  Family History  Problem Relation Age of Onset  . Heart failure Father   . Diabetes Father   . Heart disease Father   . Hyperlipidemia Father   . Hypertension Father   . Heart disease Mother     before age 62  . Hyperlipidemia Mother   . Hypertension Mother    Prior to Admission medications   Medication Sig Start Date End Date Taking? Authorizing Provider  albuterol (PROVENTIL HFA;VENTOLIN HFA) 108 (90 BASE) MCG/ACT inhaler Inhale 2 puffs into the lungs every 6 (six) hours as needed for wheezing or  shortness of breath.  10/02/12  Yes Kathee Delton, MD  albuterol (PROVENTIL) (2.5 MG/3ML) 0.083% nebulizer solution Take 2.5 mg by nebulization 4 (four) times daily.    Yes Historical Provider, MD  amLODipine (NORVASC) 5 MG tablet TAKE 1 TABLET DAILY   Yes Belva Crome III, MD  aspirin EC 325 MG tablet Take 325 mg by mouth at bedtime.   Yes Historical Provider, MD  budesonide (PULMICORT) 0.5 MG/2ML nebulizer solution Take 0.5 mg by nebulization 2 (two) times daily.    Yes Historical Provider, MD  candesartan (ATACAND) 8 MG tablet Take 1 tablet (8 mg total) by mouth every morning. 06/02/14  Yes Orson Eva, MD  carvedilol (COREG) 25 MG tablet Take 25 mg by mouth 2 (two) times daily with a meal.   Yes Historical Provider, MD  collagenase (SANTYL) ointment Apply topically 2 (two) times daily. 06/02/14  Yes Orson Eva, MD  digoxin (LANOXIN) 0.125 MG tablet Take 0.125 mg by mouth every morning.   Yes Historical Provider,  MD  feeding supplement, ENSURE COMPLETE, (ENSURE COMPLETE) LIQD Take 237 mLs by mouth 2 (two) times daily between meals. 05/29/14  Yes Orson Eva, MD  ferrous sulfate 325 (65 FE) MG tablet Take 325 mg by mouth at bedtime.    Yes Historical Provider, MD  furosemide (LASIX) 40 MG tablet TAKE 2 TABLETS IN THE MORNING AND 1 TABLET IN THE EVENING 04/23/14  Yes Kathee Delton, MD  guaiFENesin (MUCINEX) 600 MG 12 hr tablet Take 600 mg by mouth 2 (two) times daily as needed. congestion   Yes Historical Provider, MD  HYDROcodone-acetaminophen (VICODIN) 2.5-500 MG per tablet Take 1 tablet by mouth every 6 (six) hours as needed for pain.   Yes Historical Provider, MD  KLOR-CON M20 20 MEQ tablet TAKE 1 TABLET DAILY 02/17/14  Yes Kathee Delton, MD  LANOXIN 125 MCG tablet TAKE 1 TABLET DAILY 04/30/14  Yes Evans Lance, MD  levofloxacin (LEVAQUIN) 500 MG tablet Take 1 tablet (500 mg total) by mouth daily. 06/02/14  Yes Orson Eva, MD  mupirocin ointment (BACTROBAN) 2 % Apply 1 application topically 2 (two)  times daily as needed (Cuts and bruises).   Yes Historical Provider, MD  nitroGLYCERIN (NITRODUR - DOSED IN MG/24 HR) 0.2 mg/hr patch Place 1 patch onto the skin daily.   Yes Historical Provider, MD  nitroGLYCERIN (NITROSTAT) 0.4 MG SL tablet Place 0.4 mg under the tongue every 5 (five) minutes as needed for chest pain.   Yes Historical Provider, MD  predniSONE (DELTASONE) 10 MG tablet Take 1 tablet (10 mg total) by mouth daily. Resume after 9 day prednisone taper 06/02/14  Yes Orson Eva, MD  predniSONE (DELTASONE) 20 MG tablet Take 3 tablets (60 mg total) by mouth daily with breakfast. X 3 days, then 2 tablets ($RemoveBe'40mg'WbRMZMEfo$ ) daily with breakfastx 3 days, then 1 tablet ($RemoveB'20mg'oBzurqRU$ ) daily x 3 days 06/03/14  Yes Orson Eva, MD  PRESCRIPTION MEDICATION Uses 1L-2L of home Oxygen   Yes Historical Provider, MD  PROVENTIL HFA 108 (90 BASE) MCG/ACT inhaler INHALE 1 TO 2 PUFFS INTO THE LUNGS EVERY 6 HOURS AS NEEDED FOR SHORTNESS OF BREATH 02/10/14  Yes Kathee Delton, MD  simvastatin (ZOCOR) 20 MG tablet Take 20 mg by mouth at bedtime.    Yes Historical Provider, MD  tiotropium (SPIRIVA) 18 MCG inhalation capsule Place 18 mcg into inhaler and inhale daily.    Yes Historical Provider, MD  acetaminophen (TYLENOL) 325 MG tablet Take 2 tablets (650 mg total) by mouth every 4 (four) hours as needed for headache or mild pain. Patient not taking: Reported on 05/27/2014 08/09/13   Erlene Quan, PA-C   Physical Exam: Danley Danker Vitals:   06/03/14 1515  BP: 118/51  Pulse: 90  Temp:   Resp: 16     General: Well developed, well-nourished, NAD, appears stated age  HEENT: NCAT, PERRLA, EOMI, Anicteic Sclera, mucous membranes moist.   Neck: Supple, no JVD, no masses  Cardiovascular: S1 S2 auscultated, irregular, 2/6 SEM  Respiratory: Few scattered wheezes noted, tachypneic  Abdomen: Soft, nontender, nondistended, + bowel sounds  Extremities: warm dry without cyanosis clubbing. +1 edema in LE B/L  Neuro: AAOx3, cranial nerves  grossly intact. Strength equal and bilateral in upper/lower ext  Skin: multiple bruises, decub ulcer  Psych: Normal affect and demeanor with intact judgement and insight  Labs on Admission:  Basic Metabolic Panel:  Recent Labs Lab 05/28/14 0449 05/29/14 0450 05/30/14 0318 06/01/14 0344 06/03/14 1330  NA 140 142 139 134*  136*  K 4.4 3.8 3.9 4.2 4.9  CL 101 101 98 92* 95*  CO2 $Re'26 28 30 29 31  'yGw$ GLUCOSE 158* 119* 117* 233* 212*  BUN 48* 32* 31* 39* 36*  CREATININE 0.97 0.93 0.80 0.89 0.86  CALCIUM 8.5 8.6 8.4 8.6 8.8   Liver Function Tests:  Recent Labs Lab 05/29/14 0450 06/03/14 1330  AST 23 17  ALT 29 23  ALKPHOS 68 65  BILITOT 0.3 0.6  PROT 6.0 6.6  ALBUMIN 2.4* 2.7*   No results for input(s): LIPASE, AMYLASE in the last 168 hours. No results for input(s): AMMONIA in the last 168 hours. CBC:  Recent Labs Lab 05/28/14 0449 05/29/14 0450 05/30/14 0318 06/01/14 0344 06/03/14 1330  WBC 5.3 5.1 5.1 10.5 12.6*  NEUTROABS  --   --   --   --  11.7*  HGB 8.7* 8.4* 8.6* 8.5* 9.8*  HCT 28.1* 26.9* 27.2* 26.7* 29.4*  MCV 94.6 94.7 95.8 91.8 93.6  PLT 220 210 220 211 165   Cardiac Enzymes:  Recent Labs Lab 05/30/14 0318 06/03/14 1330  CKTOTAL 29  --   TROPONINI  --  <0.30    BNP (last 3 results)  Recent Labs  05/27/14 1430 06/03/14 1328  PROBNP 1578.0* 4268.0*   CBG:  Recent Labs Lab 06/01/14 1629 06/01/14 2157 06/02/14 0752 06/02/14 1146 06/02/14 1700  GLUCAP 383* 215* 226* 233* 289*    Radiological Exams on Admission: Dg Chest Port 1 View  06/03/2014   CLINICAL DATA:  Shortness of breath  EXAM: PORTABLE CHEST - 1 VIEW  COMPARISON:  Portable chest x-ray of 05/29/2014  FINDINGS: The lungs are clear but slightly hyperaerated. Mediastinal and hilar contours are unchanged. The heart is enlarged and stable with AICD leads noted.  IMPRESSION: Stable cardiomegaly.  No active lung disease.   Electronically Signed   By: Ivar Drape M.D.   On:  06/03/2014 13:45    EKG: Independently reviewed. Paced rhythm, A. fib, rate 82  Assessment/Plan  Acute on chronic respiratory failure secondary to multifactorial causes: CHF vs COPD -Patient be admitted to medical floor -Uses 2.5 L of oxygen at home;will continue supplemental oxygen to maintain his saturations above 90% -Will place patient on Levaquin, DuoNeb treatments, guaifenesin, Solu-Medrol, Lasix -Chest x-ray: Stable cardiomegaly, no active lung disease  Acute systolic CHF exacerbation -Echocardiogram: February 2015: EF 20-25% -Will hold on Lasix -BNP 4268 -Will place patient on Lasix IV 40 mg twice a day -Will monitor intake and output, daily weights -Will place patient on 1500 mL fluid obstruction per day  COPD exacerbation -Treatment and plan as above  Sacral wound -Will consult wound care -Patient currently hemodynamically stable, afebrile -Appear stable  Leukocytosis -Likely reactive to steroids -Will continue to monitor CBC  Generalized weakness/deconditioning -Patient will likely need nursing home placement -PT and OT consulted  Severe protein calorie malnutrition -Nutrition consulted -Continue nutritional supplementation -Will start Megace  Essential hypertension -Stable, continue Lasix, ARB, amlodipine  Dyslipidemia -Continue statin  Diabetes mellitus, type II -Diet controlled -Will place on ISS with CBG monitoring -Patient has lost 50 pounds in order "to cure" his diabetes  DVT prophylaxis: Lovenox  Code Status: Full  Condition: Guarded  Family Communication: Wife at bedside. Admission, patients condition and plan of care including tests being ordered have been discussed with the patient and wife who indicate understanding and agree with the plan and Code Status.  Disposition Plan: Admitted  Time spent: 65 minutes  Asanti Craigo D.O. [facility]  Pager [phone]  If 7PM-7AM, please contact  night-coverage [email or web address] Password [facility] 06/03/2014, 3:46 PM

## 2014-06-03 NOTE — ED Notes (Signed)
MD at bedside. 

## 2014-06-03 NOTE — Progress Notes (Signed)
Pt admitted to the unit at 1701. Pt mental status is A&OX4. Pt oriented to room, staff, and call bell. Skin has pressure ulcer on sacrum and skin tears to right arm, and bruising on both arms. Full assessment charted in [facility]. Call bell within reach. Visitor guidelines reviewed w/ pt and/or family.

## 2014-06-03 NOTE — ED Notes (Signed)
X-ray at bedside

## 2014-06-03 NOTE — Plan of Care (Signed)
Problem: ICU Phase Progression Outcomes Goal: Dyspnea controlled at rest Outcome: Progressing     

## 2014-06-04 DIAGNOSIS — J441 Chronic obstructive pulmonary disease with (acute) exacerbation: Secondary | ICD-10-CM

## 2014-06-04 DIAGNOSIS — I5043 Acute on chronic combined systolic (congestive) and diastolic (congestive) heart failure: Secondary | ICD-10-CM

## 2014-06-04 DIAGNOSIS — I5023 Acute on chronic systolic (congestive) heart failure: Principal | ICD-10-CM

## 2014-06-04 DIAGNOSIS — J9621 Acute and chronic respiratory failure with hypoxia: Secondary | ICD-10-CM

## 2014-06-04 DIAGNOSIS — L89159 Pressure ulcer of sacral region, unspecified stage: Secondary | ICD-10-CM

## 2014-06-04 DIAGNOSIS — E43 Unspecified severe protein-calorie malnutrition: Secondary | ICD-10-CM

## 2014-06-04 DIAGNOSIS — J9601 Acute respiratory failure with hypoxia: Secondary | ICD-10-CM

## 2014-06-04 LAB — GLUCOSE, CAPILLARY
GLUCOSE-CAPILLARY: 228 mg/dL — AB (ref 70–99)
Glucose-Capillary: 326 mg/dL — ABNORMAL HIGH (ref 70–99)
Glucose-Capillary: 230 mg/dL — ABNORMAL HIGH (ref 70–99)
Glucose-Capillary: 233 mg/dL — ABNORMAL HIGH (ref 70–99)

## 2014-06-04 LAB — CBC
HEMATOCRIT: 28.2 % — AB (ref 39.0–52.0)
HEMOGLOBIN: 8.9 g/dL — AB (ref 13.0–17.0)
MCH: 29.5 pg (ref 26.0–34.0)
MCHC: 31.6 g/dL (ref 30.0–36.0)
MCV: 93.4 fL (ref 78.0–100.0)
Platelets: 194 10*3/uL (ref 150–400)
RBC: 3.02 MIL/uL — AB (ref 4.22–5.81)
RDW: 14.1 % (ref 11.5–15.5)
WBC: 11.5 10*3/uL — ABNORMAL HIGH (ref 4.0–10.5)

## 2014-06-04 LAB — BASIC METABOLIC PANEL
Anion gap: 10 (ref 5–15)
BUN: 36 mg/dL — AB (ref 6–23)
CHLORIDE: 95 meq/L — AB (ref 96–112)
CO2: 32 meq/L (ref 19–32)
CREATININE: 0.86 mg/dL (ref 0.50–1.35)
Calcium: 8.6 mg/dL (ref 8.4–10.5)
GFR calc Af Amer: >90 mL/min (ref >90)
GFR calc non Af Amer: 82 mL/min — ABNORMAL LOW (ref >90)
GLUCOSE: 234 mg/dL — AB (ref 70–99)
POTASSIUM: 4.5 meq/L (ref 3.7–5.3)
Sodium: 137 mEq/L (ref 137–147)

## 2014-06-04 LAB — PHOSPHORUS: Phosphorus: 3.3 mg/dL (ref 2.3–4.6)

## 2014-06-04 LAB — MAGNESIUM: Magnesium: 2.5 mg/dL (ref 1.5–2.5)

## 2014-06-04 MED ORDER — FUROSEMIDE 10 MG/ML IJ SOLN
40.0000 mg | Freq: Two times a day (BID) | INTRAMUSCULAR | Status: AC
  Administered 2014-06-04 – 2014-06-06 (×4): 40 mg via INTRAVENOUS
  Filled 2014-06-04 (×3): qty 4

## 2014-06-04 MED ORDER — SODIUM CHLORIDE 0.9 % IV SOLN: INTRAVENOUS | Status: DC

## 2014-06-04 MED ORDER — SILVER NITRATE-POT NITRATE 75-25 % EX MISC
1.0000 "application " | CUTANEOUS | Status: DC | PRN
  Filled 2014-06-04: qty 1

## 2014-06-04 MED ORDER — ALPRAZOLAM 0.25 MG PO TABS
0.2500 mg | ORAL_TABLET | Freq: Two times a day (BID) | ORAL | Status: DC | PRN
  Administered 2014-06-04 (×2): 0.25 mg via ORAL
  Filled 2014-06-04 (×4): qty 1

## 2014-06-04 MED ORDER — IPRATROPIUM-ALBUTEROL 0.5-2.5 (3) MG/3ML IN SOLN
3.0000 mL | Freq: Four times a day (QID) | RESPIRATORY_TRACT | Status: DC
  Administered 2014-06-04 (×2): 3 mL via RESPIRATORY_TRACT
  Filled 2014-06-04 (×2): qty 3

## 2014-06-04 MED ORDER — INSULIN GLARGINE 100 UNIT/ML ~~LOC~~ SOLN
10.0000 [IU] | Freq: Every day | SUBCUTANEOUS | Status: DC
  Administered 2014-06-04 – 2014-06-09 (×6): 10 [IU] via SUBCUTANEOUS
  Filled 2014-06-04 (×6): qty 0.1

## 2014-06-04 MED ORDER — COLLAGENASE 250 UNIT/GM EX OINT
TOPICAL_OINTMENT | Freq: Every day | CUTANEOUS | Status: DC
  Administered 2014-06-04: 10:00:00 via TOPICAL
  Administered 2014-06-05: 1 via TOPICAL
  Filled 2014-06-04: qty 30

## 2014-06-04 MED ORDER — LEVALBUTEROL HCL 1.25 MG/0.5ML IN NEBU
1.2500 mg | INHALATION_SOLUTION | Freq: Four times a day (QID) | RESPIRATORY_TRACT | Status: DC
  Administered 2014-06-04 – 2014-06-05 (×5): 1.25 mg via RESPIRATORY_TRACT
  Filled 2014-06-04 (×7): qty 0.5

## 2014-06-04 MED ORDER — GLUCERNA SHAKE PO LIQD
237.0000 mL | Freq: Two times a day (BID) | ORAL | Status: DC
  Administered 2014-06-04 – 2014-06-09 (×6): 237 mL via ORAL
  Filled 2014-06-04 (×2): qty 237

## 2014-06-04 MED ORDER — DILTIAZEM HCL ER COATED BEADS 180 MG PO CP24
180.0000 mg | ORAL_CAPSULE | Freq: Every day | ORAL | Status: DC
  Administered 2014-06-05 – 2014-06-09 (×5): 180 mg via ORAL
  Filled 2014-06-04 (×5): qty 1

## 2014-06-04 MED ORDER — LEVALBUTEROL HCL 1.25 MG/0.5ML IN NEBU
1.2500 mg | INHALATION_SOLUTION | Freq: Four times a day (QID) | RESPIRATORY_TRACT | Status: DC
  Administered 2014-06-04: 1.25 mg via RESPIRATORY_TRACT
  Filled 2014-06-04 (×3): qty 0.5

## 2014-06-04 NOTE — Progress Notes (Signed)
INITIAL NUTRITION ASSESSMENT  DOCUMENTATION CODES Per approved criteria  -Severe malnutrition in the context of chronic illness  Pt meets criteria for severe MALNUTRITION in the context of chronic illness as evidenced by 21% weight loss in 6 months and reported intake <75% of estimated needs for >1 month.  INTERVENTION: - Glucerna Shake po BID, each supplement provides 220 kcal and 10 grams of protein - RD will continue to monitor for nutrition care plan.  NUTRITION DIAGNOSIS: Inadequate oral intake related to shortness of breath as evidenced by weight loss and poor po.   Goal: Pt to meet >/= 90% of their estimated nutrition needs   Monitor:  Weight trend, po intake, acceptance of supplement, labs  Reason for Assessment: MST  78 y.o. male  Admitting Dx: Acute respiratory failure  ASSESSMENT: 78 y.o. male with a history of CHF with an EF of 20-25%, pacemaker/I CD placement, diabetes mellitus, COPD, presented to the emergency department from [place] facility for shortness of breath. Patient was recently admitted and discharged 05/27/2014 to 06/02/2014 for acute on chronic respiratory failure.  - Pt reports that he had lost 37 lbs within the past 6 months. At first, he said the weight loss was intentional, but then he said he couldn't stop. He was losing weight to "cure Diabetes."  - Pt reports that he rarely gets hungry and eats only a few bites at a time. This morning pt ate ~1/3 of his breakfast.  - Pt with chronic sacral wound.  - Pt receiving Megace.   Nutrition Focused Physical Exam:  Subcutaneous Fat:  Orbital Region: mild wasting Upper Arm Region: mild wasting Thoracic and Lumbar Region: moderate wasting  Muscle:  Temple Region: moderate wasting Clavicle Bone Region: moderate wasting Clavicle and Acromion Bone Region: moderate wasting Scapular Bone Region: severe wasting Dorsal Hand: mild wasting Patellar Region: mild wasting Anterior Thigh Region:  WNL Posterior Calf Region: WNL  Height: Ht Readings from Last 1 Encounters:  06/03/14 $RemoveB'5\' 7"'zEonGvRu$  (1.702 m)    Weight: Wt Readings from Last 1 Encounters:  06/04/14 141 lb 8 oz (64.184 kg)    Ideal Body Weight: 66.1 kg  % Ideal Body Weight: 97%  Wt Readings from Last 10 Encounters:  06/04/14 141 lb 8 oz (64.184 kg)  05/30/14 140 lb (63.504 kg)  05/18/14 147 lb (66.679 kg)  05/15/14 147 lb (66.679 kg)  03/23/14 162 lb 12.8 oz (73.846 kg)  03/19/14 162 lb (73.483 kg)  02/23/14 160 lb (72.576 kg)  02/05/14 164 lb 3.2 oz (74.481 kg)  12/30/13 163 lb 6.4 oz (74.118 kg)  12/03/13 168 lb 12.8 oz (76.567 kg)    Usual Body Weight: 178 lbs  % Usual Body Weight: 79%  BMI:  Body mass index is 22.16 kg/(m^2).  Estimated Nutritional Needs: Kcal: 1750-2000 Protein: 85-95 g Fluid: 1.8-2.0 L/day  Skin: unstageable wound on sacrum  Diet Order: Diet heart healthy/carb modified Diet NPO time specified  EDUCATION NEEDS: -Education needs addressed   Intake/Output Summary (Last 24 hours) at 06/04/14 1245 Last data filed at 06/04/14 1000  Gross per 24 hour  Intake    240 ml  Output   1550 ml  Net  -1310 ml    Last BM: 11/29   Labs:   Recent Labs Lab 06/01/14 0344 06/03/14 1330 06/04/14 0513  NA 134* 136* 137  K 4.2 4.9 4.5  CL 92* 95* 95*  CO2 29 31 32  BUN 39* 36* 36*  CREATININE 0.89 0.86 0.86  CALCIUM 8.6  8.8 8.6  MG  --   --  2.5  PHOS  --   --  3.3  GLUCOSE 233* 212* 234*    CBG (last 3)   Recent Labs  06/03/14 2151 06/04/14 0833 06/04/14 1215  GLUCAP 317* 228* 326*    Scheduled Meds: . amLODipine  5 mg Oral Daily  . antiseptic oral rinse  7 mL Mouth Rinse BID  . aspirin EC  325 mg Oral QHS  . budesonide  0.5 mg Nebulization BID  . carvedilol  25 mg Oral BID WC  . collagenase   Topical Daily  . digoxin  0.125 mg Oral q morning - 10a  . enoxaparin (LOVENOX) injection  40 mg Subcutaneous Q24H  . feeding supplement (ENSURE COMPLETE)  237 mL Oral  BID BM  . ferrous sulfate  325 mg Oral QHS  . furosemide  40 mg Intravenous Q12H  . guaiFENesin  600 mg Oral BID  . insulin aspart  0-9 Units Subcutaneous TID WC  . insulin glargine  10 Units Subcutaneous Daily  . ipratropium-albuterol  3 mL Nebulization QID  . irbesartan  75 mg Oral Daily  . levofloxacin (LEVAQUIN) IV  500 mg Intravenous Q24H  . megestrol  20 mg Oral BID  . methylPREDNISolone (SOLU-MEDROL) injection  60 mg Intravenous Q12H  . nitroGLYCERIN  0.2 mg Transdermal Q24H  . potassium chloride SA  20 mEq Oral Daily  . simvastatin  20 mg Oral QHS  . sodium chloride  3 mL Intravenous Q12H    Continuous Infusions: . sodium chloride      Past Medical History  Diagnosis Date  . Hypokalemia   . PVD (peripheral vascular disease)   . HTN (hypertension)   . CHF (congestive heart failure)   . Ventricular tachycardia   . Ischemic heart disease, chronic   . Dyslipidemia   . CAD (coronary artery disease)   . On home oxygen therapy     "~ 2.5L; pretty much 24/7" (05/27/2014)  . Pacemaker   . Self-catheterizes urinary bladder     since back surgery.  6- 7 times a day. (05/27/2014)  . Difficult intubation     pt states "not aware of difficult intubation, never told"  . Heart murmur     dx 1974  . Shortness of breath   . Head injury, closed, with brief LOC 2013  . Neurogenic bladder     since back surgery - self caths  . Neurogenic bowel     digitally stimulates daily.  Marland Kitchen COPD (chronic obstructive pulmonary disease)      PT USES OXYGEN 24 HRS A DAY - 2 L NASAL CANNULA  . Emphysema   . Anemia     SERUM MONOCLONAL IgM KAPPA PROTEIN - NO CLINICAL EVIDENCE FOR PROGRESSION TO MULTIPLE MYELOMA OR OTHER PROLIFERATIVE DISORDER - PER OFFICE NOTE DR. SHERRILL DATED 12/30/13  . Pneumonia X 2  . Borderline type 2 diabetes mellitus   . AICD (automatic cardioverter/defibrillator) present   . TIA (transient ischemic attack) 1980's    "they say I did; I don't believe it"  . Colon  cancer   . MRSA infection     Past Surgical History  Procedure Laterality Date  . Lumbar disc surgery  X 2  . Iliac artery stent Left   . Bi-ventricular implantable cardioverter defibrillator  (crt-d)  04-10-08    implantation of Medtronic Concerto Bi-V ICD [; Cristopher Peru MD  . Colonoscopy    . Polypectomy    .  Insertion of iliac stent Right 06/04/2012    external   . Cardiac catheterization    . Insert / replace / remove pacemaker    . Endarterectomy femoral  06/28/2012    Procedure: ENDARTERECTOMY FEMORAL;  Surgeon: Serafina Mitchell, MD;  Location: [place];  Service: Vascular;  Laterality: Left;  . Patch angioplasty  06/28/2012    Procedure: PATCH ANGIOPLASTY;  Surgeon: Serafina Mitchell, MD;  Location: [facility] OR;  Service: Vascular;  Laterality: Left;  Using 0.8 cm x 15.2 cm Hemashield Patch.  . Colon surgery  03/2004  . Cataract extraction w/ intraocular lens  implant, bilateral Bilateral   . Cholecystectomy    . Coronary artery bypass graft  1998    x3 vessels  . Femoral-popliteal bypass graft Right 03/28/2013    Procedure: BYPASS GRAFT FEMORAL-BELOW KNEE POPLITEAL ARTERY Using 65mm x 80 cm Propaten Graft ,With Right Common Femoral Artery Endarterectomy.;  Surgeon: Serafina Mitchell, MD;  Location: [facility] OR;  Service: Vascular;  Laterality: Right;  . Debridment of decubitus ulcer N/A 05/18/2014    Procedure: EXCISION AND CLOSURE OF DECUBITUS ULCER 5CM;  Surgeon: Pedro Earls, MD;  Location: [facility];  Service: General;  Laterality: N/A;  . Back surgery    . Posterior lumbar fusion  X 1    Laurette Schimke MS, RD, LDN

## 2014-06-04 NOTE — Progress Notes (Signed)
Physical Therapy Wound Treatment Patient Details  Name: DAVIONNE DOWTY MRN: [id number] Date of Birth: 1935/08/15  Today's Date: 06/04/2014 Time: 1032-1100 Time Calculation (min): 28 min  Subjective  Subjective: I couldn't breathe well so we had to come back... Patient and Family Stated Goals: Get this wound to heal up. Prior Treatments: many treatments over the year  Pain Score:    Wound Assessment  Pressure Ulcer 08/08/13 Stage II -  Partial thickness loss of dermis presenting as a shallow open ulcer with a red, pink wound bed without slough. (Active)     Pressure Ulcer 06/03/14 Unstageable - Full thickness tissue loss in which the base of the ulcer is covered by slough (yellow, tan, gray, green or brown) and/or eschar (tan, brown or black) in the wound bed. (Active)  Dressing Type Gauze (Comment);Moist to dry;Other (Comment) 06/04/2014  1:56 PM  Dressing Changed 06/04/2014  1:56 PM  Dressing Change Frequency Daily 06/04/2014  1:56 PM  State of Healing Eschar 06/04/2014  1:56 PM  Site / Wound Assessment Yellow;Brown;Pale 06/04/2014  1:56 PM  % Wound base Red or Granulating 5% 06/04/2014  1:56 PM  % Wound base Yellow 90% 06/04/2014  1:56 PM  % Wound base Black 0% 06/04/2014  1:56 PM  % Wound base Other (Comment) 0% 06/04/2014  1:56 PM  Peri-wound Assessment Intact 06/04/2014  1:56 PM  Wound Length (cm) 5 cm 06/04/2014  1:56 PM  Wound Width (cm) 3 cm 06/04/2014  1:56 PM  Wound Depth (cm) 2.5 cm 06/04/2014  1:56 PM  Tunneling (cm) .05 06/03/2014  8:50 PM  Undermining (cm) 0 06/03/2014  8:50 PM  Margins Unattached edges (unapproximated) 06/04/2014  1:56 PM  Drainage Amount None 06/04/2014  1:56 PM  Drainage Description Serosanguineous 06/04/2014  1:56 PM  Treatment Hydrotherapy (Ultrasonic mist);Debridement (Selective);Cleansed;Packing (Saline gauze) 06/04/2014  1:56 PM     Wound / Incision (Open or Dehisced) Other (Comment) Arm Right;Lower;Lateral (Active)  Dressing Type Foam 06/03/2014  5:15 PM   Dressing Changed Changed 06/03/2014  5:15 PM  Dressing Status Clean;Dry;Intact 06/03/2014  5:15 PM  Dressing Change Frequency Every 5 days 06/02/2014  8:26 AM     Wound / Incision (Open or Dehisced) 06/03/14 Other (Comment) Sacrum Mid debrided nonhealing sacral wound (Active)  Dressing Type Moist to dry 06/03/2014  5:15 PM  Dressing Changed Reinforced 06/03/2014  5:15 PM  Dressing Status Old drainage 06/03/2014  5:15 PM  Site / Wound Assessment Yellow;Pink 06/03/2014  5:15 PM  Drainage Amount Minimal 06/03/2014  5:15 PM  Drainage Description Serosanguineous 06/03/2014  5:15 PM  Treatment Hydrotherapy (Pulse lavage);Cleansed;Debridement (Selective) 06/03/2014  5:15 PM   Hydrotherapy Ultrasonic mist  - wound location: sacrum Ultrasonic mist at 35KHz (+/-3KHz) at ___ percent: 100 % Ultrasonic mist therapy minutes: 6 min Selective Debridement Selective Debridement - Location: sacrum Selective Debridement - Tools Used: Forceps;Scissors Selective Debridement - Tissue Removed: eschar, fibrin   Wound Assessment and Plan  Wound Therapy - Assess/Plan/Recommendations Wound Therapy - Clinical Statement: Pt can benefit from hydrotherapy and selective debridement to take this wound from chronic to more acute to promote progress toward healing. Wound Therapy - Functional Problem List: deconditioned Factors Delaying/Impairing Wound Healing: Infection - systemic/local;Altered sensation Hydrotherapy Plan: Debridement;Dressing change;Patient/family education;Pulsatile lavage with suction Wound Therapy - Frequency: 6X / week Wound Therapy - Follow Up Recommendations: [place] Wound Plan: see above  Wound Therapy Goals- Improve the function of patient's integumentary system by progressing the wound(s) through the phases of wound healing (  inflammation - proliferation - remodeling) by: Decrease Necrotic Tissue to: 75 Decrease Necrotic Tissue - Progress: Goal set today Increase Granulation Tissue to:  25 Increase Granulation Tissue - Progress: Goal set today Goals/treatment plan/discharge plan were made with and agreed upon by patient/family: Yes Time For Goal Achievement: 7 days Wound Therapy - Potential for Goals: Good  Goals will be updated until maximal potential achieved or discharge criteria met.  Discharge criteria: when goals achieved, discharge from hospital, MD decision/surgical intervention, no progress towards goals, refusal/missing three consecutive treatments without notification or medical reason.  GP     Inocencia Murtaugh, Tessie Fass 06/04/2014, 2:28 PM

## 2014-06-04 NOTE — Progress Notes (Signed)
Utilization review completed.  

## 2014-06-04 NOTE — Consult Note (Signed)
WOC wound consult note Reason for Consult: evaluation of sacral wounds.  Review of records, pt with surgery for primary closure for the sacral wound that has been present chronically. Dr. Hassell Done 05/18/14 after this wound opened at the time of his last admission 1125/15, he has had PT for hydrotherapy during his most recent admission and was discharged to a rehab facility only 24 hours and now has returned.   Wound type: Sacral Pressure ulcer; unstageable  Pressure Ulcer POA: Yes Measurement: 5cm x 3cm x 2.5cm  Wound bed:90% yellow slough/fibrin Drainage (amount, consistency, odor) minimal, serosanguinous  Periwound:intact, just slightly macerated at the wound edges.  Dressing procedure/placement/frequency: Will reestablish the same POC, as he continues to need hydrotherapy and enzymatic debridement to clean this area up.  He is able to turn from side to side easily. Will order chair pad for his chair for pressure redistribution when he is up sitting.  May want to consider making sure surgery team is aware that this patient is inpatient again.  [facility] team will follow along with you for weekly wound assessments.  Please notify me of any acute changes in the wounds or any new areas of concerns Para March RN,CWOCN [phone]

## 2014-06-04 NOTE — Progress Notes (Signed)
PROGRESS NOTE  [name] HEN:[id number] DOB: Apr 29, 1936 DOA: 06/03/2014 PCP: Dorian Heckle, MD  HPI: 78 y.o. male with a history of CHF with an EF of 20-25%, pacemaker/I CD placement, diabetes mellitus, COPD, presented to the emergency department from [facility] facility for shortness of breath.  Subjective/ 24 H Interval events - patient breathing better this morning but still not back to his baseline.   Assessment/Plan: Principal Problem:   Acute respiratory failure Active Problems:   Dyslipidemia   Essential hypertension   Cardiomyopathy, ischemic- EF 20-25% 2D 08/08/13   Persistent atrial fibrillation   Decubitus ulcer of coccygeal region-excision and closure Nov 2015   General weakness   Weakness generalized   Protein-calorie malnutrition, severe   Acute on chronic respiratory failure   COPD exacerbation   COPD (chronic obstructive pulmonary disease)  Acute on chronic respiratory failure - this is likely multifactorial from acute on chronic systolic heart failure as well as acute on chronic COPD -Uses 2.5 L of oxygen at home;will continue supplemental oxygen to maintain his saturations above 90%  Acute on chronic systolic CHF exacerbation - Echocardiogram: February 2015: EF 20-25% - Patient with evidence of fluid overload with lower extremity edema and JVD, not much crackles on lung exam. His BNP was elevated to 4000 higher than one week ago at 1500. - Cardiology consult today, appreciate input - Continue IV Lasix 40 mg IV twice a day - Strict I's and O's, daily weights.  COPD exacerbation -Treatment and plan as above  Sacral wound - consult wound care - Patient currently hemodynamically stable, afebrile - Appear stable  Leukocytosis - Likely reactive to steroids - Will continue to monitor CBC  Generalized weakness/deconditioning - Patient will likely need nursing home placement - PT and OT consulted  Severe protein calorie malnutrition -  Nutrition consulted - Continue nutritional supplementation - Will start Megace  Essential hypertension - Stable, continue Lasix, ARB, amlodipine  Dyslipidemia - Continue statin  Diabetes mellitus, type II - Diet controlled - Will place on ISS with CBG monitoring - Patient has lost 50 pounds in order "to cure" his diabetes   Diet: heart healthy Fluids: none DVT Prophylaxis: Lovenox  Code Status: DNR Family Communication: d/w family bedside  Disposition Plan: inpatient, home when ready   Consultants:  Cardiology   Procedures:  None    Antibiotics Levofloxacin   Studies  Dg Chest Port 1 View  06/03/2014   CLINICAL DATA:  Shortness of breath  EXAM: PORTABLE CHEST - 1 VIEW  COMPARISON:  Portable chest x-ray of 05/29/2014  FINDINGS: The lungs are clear but slightly hyperaerated. Mediastinal and hilar contours are unchanged. The heart is enlarged and stable with AICD leads noted.  IMPRESSION: Stable cardiomegaly.  No active lung disease.   Electronically Signed   By: Ivar Drape M.D.   On: 06/03/2014 13:45   Objective  Filed Vitals:   06/03/14 2154 06/04/14 0350 06/04/14 0607 06/04/14 0921  BP: 118/48  146/66   Pulse: 84 85 88   Temp: 97.7 F (36.5 C)  98.4 F (36.9 C)   TempSrc: Oral  Oral   Resp: 16 18 16    Height:      Weight:   64.184 kg (141 lb 8 oz)   SpO2: 100% 100% 96% 97%    Intake/Output Summary (Last 24 hours) at 06/04/14 1345 Last data filed at 06/04/14 1000  Gross per 24 hour  Intake    240 ml  Output   1550 ml  Net  -1310 ml   Filed Weights   06/03/14 1712 06/04/14 0607  Weight: 63.005 kg (138 lb 14.4 oz) 64.184 kg (141 lb 8 oz)    Exam:  General:  NAD, tachypneic but comfortable  Cardiovascular: RRR, +JVD and LE edema  Respiratory: distant breath sounds, no crackles, no wheezing  Abdomen: soft, non tender  MSK: 1+ LE pitting edema  Neuro: non focal  Data Reviewed: Basic Metabolic Panel:  Recent Labs Lab 05/29/14 0450  05/30/14 0318 06/01/14 0344 06/03/14 1330 06/04/14 0513  NA 142 139 134* 136* 137  K 3.8 3.9 4.2 4.9 4.5  CL 101 98 92* 95* 95*  CO2 28 30 29 31  32  GLUCOSE 119* 117* 233* 212* 234*  BUN 32* 31* 39* 36* 36*  CREATININE 0.93 0.80 0.89 0.86 0.86  CALCIUM 8.6 8.4 8.6 8.8 8.6  MG  --   --   --   --  2.5  PHOS  --   --   --   --  3.3   Liver Function Tests:  Recent Labs Lab 05/29/14 0450 06/03/14 1330  AST 23 17  ALT 29 23  ALKPHOS 68 65  BILITOT 0.3 0.6  PROT 6.0 6.6  ALBUMIN 2.4* 2.7*   CBC:  Recent Labs Lab 05/29/14 0450 05/30/14 0318 06/01/14 0344 06/03/14 1330 06/04/14 0513  WBC 5.1 5.1 10.5 12.6* 11.5*  NEUTROABS  --   --   --  11.7*  --   HGB 8.4* 8.6* 8.5* 9.8* 8.9*  HCT 26.9* 27.2* 26.7* 29.4* 28.2*  MCV 94.7 95.8 91.8 93.6 93.4  PLT 210 220 211 165 194   Cardiac Enzymes:  Recent Labs Lab 05/30/14 0318 06/03/14 1330  CKTOTAL 29  --   TROPONINI  --  <0.30   BNP (last 3 results)  Recent Labs  05/27/14 1430 06/03/14 1328  PROBNP 1578.0* 4268.0*   CBG:  Recent Labs Lab 06/02/14 1700 06/03/14 1730 06/03/14 2151 06/04/14 0833 06/04/14 1215  GLUCAP 289* 224* 317* 228* 326*    Recent Results (from the past 240 hour(s))  Blood Culture (routine x 2)     Status: None   Collection Time: 05/27/14  2:30 PM  Result Value Ref Range Status   Specimen Description BLOOD RIGHT ARM  Final   Special Requests BOTTLES DRAWN AEROBIC AND ANAEROBIC 5CC  Final   Culture  Setup Time   Final    05/27/2014 21:42 Performed at [facility]   Final    NO GROWTH 5 DAYS Performed at [facility]    Report Status 06/02/2014 FINAL  Final  Culture, blood (routine x 2)     Status: None   Collection Time: 05/27/14  3:17 PM  Result Value Ref Range Status   Specimen Description BLOOD RIGHT HAND  Final   Special Requests BOTTLES DRAWN AEROBIC AND ANAEROBIC 10CC  Final   Culture  Setup Time   Final    05/28/2014 04:48 Performed at  [place]   Final    NO GROWTH 5 DAYS Performed at [facility]    Report Status 06/03/2014 FINAL  Final  Urine culture     Status: None   Collection Time: 05/27/14  3:35 PM  Result Value Ref Range Status   Specimen Description URINE, CLEAN CATCH  Final   Special Requests NONE  Final   Culture  Setup Time   Final    05/28/2014 05:41 Performed  at [place]   Final    2,000 COLONIES/ML Performed at [facility]    Culture   Final    INSIGNIFICANT GROWTH Performed at [facility]    Report Status 05/29/2014 FINAL  Final  Urine culture     Status: None   Collection Time: 05/30/14  1:40 PM  Result Value Ref Range Status   Specimen Description URINE, CATHETERIZED  Final   Special Requests NONE  Final   Culture  Setup Time   Final    05/30/2014 14:10 Performed at [place] Performed at [facility]   Final   Culture NO GROWTH Performed at [facility]   Final   Report Status 05/31/2014 FINAL  Final     Scheduled Meds: . amLODipine  5 mg Oral Daily  . antiseptic oral rinse  7 mL Mouth Rinse BID  . aspirin EC  325 mg Oral QHS  . budesonide  0.5 mg Nebulization BID  . carvedilol  25 mg Oral BID WC  . collagenase   Topical Daily  . digoxin  0.125 mg Oral q morning - 10a  . enoxaparin (LOVENOX) injection  40 mg Subcutaneous Q24H  . feeding supplement (ENSURE COMPLETE)  237 mL Oral BID BM  . feeding supplement (GLUCERNA SHAKE)  237 mL Oral BID BM  . ferrous sulfate  325 mg Oral QHS  . furosemide  40 mg Intravenous Q12H  . guaiFENesin  600 mg Oral BID  . insulin aspart  0-9 Units Subcutaneous TID WC  . insulin glargine  10 Units Subcutaneous Daily  . ipratropium-albuterol  3 mL Nebulization QID  . irbesartan  75 mg Oral Daily  . levofloxacin (LEVAQUIN) IV  500 mg Intravenous Q24H  . megestrol  20 mg Oral BID  . methylPREDNISolone (SOLU-MEDROL)  injection  60 mg Intravenous Q12H  . nitroGLYCERIN  0.2 mg Transdermal Q24H  . potassium chloride SA  20 mEq Oral Daily  . simvastatin  20 mg Oral QHS  . sodium chloride  3 mL Intravenous Q12H   Continuous Infusions: . sodium chloride      Time spent: 35 minutes  Marzetta Board, MD [facility] Pager [phone]. If 7 PM - 7 AM, please contact night-coverage at [email or web address], password [facility] 06/04/2014, 1:45 PM  LOS: 1 day

## 2014-06-04 NOTE — Consult Note (Signed)
CARDIOLOGY CONSULT NOTE   Patient ID: DEMONTRE PADIN MRN: [id number], DOB/AGE: May 18, 1936   Admit date: 06/03/2014 Date of Consult: 06/04/2014   Primary Physician: Dorian Heckle, MD Primary Cardiologist: Dr. Tamala Julian Electrophysiologist: Dr. Lovena Le  Pt. Profile   frail 78 year old Caucasian male with past medical history of coronary artery disease s/p CABG, ischemic cardiomyopathy, chronic systolic heart failure with baseline EF 20-25%, status post bi-V ICD, significant peripheral vascular disease, diabetes and severe COPD on 24/7 2.5L home oxygen returned with SOB 1 day after discharge to rehab facility  Problem List  Past Medical History  Diagnosis Date  . Hypokalemia   . PVD (peripheral vascular disease)   . HTN (hypertension)   . CHF (congestive heart failure)   . Ventricular tachycardia   . Ischemic heart disease, chronic   . Dyslipidemia   . CAD (coronary artery disease)   . On home oxygen therapy     "~ 2.5L; pretty much 24/7" (05/27/2014)  . Pacemaker   . Self-catheterizes urinary bladder     since back surgery.  6- 7 times a day. (05/27/2014)  . Difficult intubation     pt states "not aware of difficult intubation, never told"  . Heart murmur     dx 1974  . Shortness of breath   . Head injury, closed, with brief LOC 2013  . Neurogenic bladder     since back surgery - self caths  . Neurogenic bowel     digitally stimulates daily.  Marland Kitchen COPD (chronic obstructive pulmonary disease)      PT USES OXYGEN 24 HRS A DAY - 2 L NASAL CANNULA  . Emphysema   . Anemia     SERUM MONOCLONAL IgM KAPPA PROTEIN - NO CLINICAL EVIDENCE FOR PROGRESSION TO MULTIPLE MYELOMA OR OTHER PROLIFERATIVE DISORDER - PER OFFICE NOTE DR. SHERRILL DATED 12/30/13  . Pneumonia X 2  . Borderline type 2 diabetes mellitus   . AICD (automatic cardioverter/defibrillator) present   . TIA (transient ischemic attack) 1980's    "they say I did; I don't believe it"  . Colon cancer   . MRSA infection     Past Surgical History  Procedure Laterality Date  . Lumbar disc surgery  X 2  . Iliac artery stent Left   . Bi-ventricular implantable cardioverter defibrillator  (crt-d)  04-10-08    implantation of Medtronic Concerto Bi-V ICD [; Cristopher Peru MD  . Colonoscopy    . Polypectomy    . Insertion of iliac stent Right 06/04/2012    external   . Cardiac catheterization    . Insert / replace / remove pacemaker    . Endarterectomy femoral  06/28/2012    Procedure: ENDARTERECTOMY FEMORAL;  Surgeon: Serafina Mitchell, MD;  Location: [facility];  Service: Vascular;  Laterality: Left;  . Patch angioplasty  06/28/2012    Procedure: PATCH ANGIOPLASTY;  Surgeon: Serafina Mitchell, MD;  [facility];  Service: Vascular;  Laterality: Left;  Using 0.8 cm x 15.2 cm Hemashield Patch.  . Colon surgery  03/2004  . Cataract extraction w/ intraocular lens  implant, bilateral Bilateral   . Cholecystectomy    . Coronary artery bypass graft  1998    x3 vessels  . Femoral-popliteal bypass graft Right 03/28/2013    Procedure: BYPASS GRAFT FEMORAL-BELOW KNEE POPLITEAL ARTERY Using 86mm x 80 cm Propaten Graft ,With Right Common Femoral Artery Endarterectomy.;  Surgeon: Serafina Mitchell, MD;  Location: [facility];  Service: Vascular;  Laterality: Right;  .  Debridment of decubitus ulcer N/A 05/18/2014    Procedure: EXCISION AND CLOSURE OF DECUBITUS ULCER 5CM;  Surgeon: Pedro Earls, MD;  Location: [facility];  Service: General;  Laterality: N/A;  . Back surgery    . Posterior lumbar fusion  X 1     Allergies  No Known Allergies  HPI   The patient is a frail 78 year old Caucasian male with past medical history of coronary artery disease s/p CABG, ischemic cardiomyopathy, chronic systolic heart failure with baseline EF 20-25%, status post Bi-V ICD, significant peripheral vascular disease, diabetes and severe COPD on 24/7 2.5L home oxygen. Patient's last cardiac catheterization in 08/2004 showed severe ischemic cardiomyopathy,  patent SVG and LIMA grafts. His last echo on 08/08/2013 showed EF 20-25%, diffuse hypokinesis, moderate to severe mitral regurg, moderately reduced RV peak, PA peak pressure 45 mmHg. Patient was seen by Dr. Tamala Julian in May 2015, at which time it was noted he was having abdominal twitching which seems to indicate diaphragmatic electrostimulation. He was followed by Dr. Lovena Le in August 2015, at which time his diaphragmatic stimulation has resolved. He denies any significant chest discomfort with exertion. According to the patient, he has recently noticed increasing dependent ankle edema, however no significant orthopnea or paroxysmal nocturnal dyspnea. He has been able to sleep flat on one pillow at night without significant shortness of breath. Prior to 3-4 weeks ago, patient has been able to walk anywhere in the house he wanted without significant SOB while on 2.5L home O2.  Patient was admitted by surgery team on 05/18/2014 for excision and closure of the midline decubitus ulcer. He has been having problem with slow healing ulcer since that time. He was admitted at [facility] on November 25 for weakness after his recent surgery. In the last admission, he was also treated for acute respiratory failure. He was discharged to rehab facility on 06/02/2014, he came back one day later with recurrent shortness of breath and lower extremity edema. According to the patient, he denies any recent fever, chill, however he does have significant productive cough with thick green phlegm. Although he does have increasing ankle edema, he denies any significant orthopnea or paroxysmal nocturnal dyspnea. He think his Lasix has been cut down from $RemoveB'80mg'scDZXXcq$  AM and $Remo'40mg'NJZMr$  PM to 40 mg BID during the last admission, however this was not reflected in the previous discharge summary. It is unclear as this time how much Lasix he has been taking. Cardiology team has been consulted for possible heart failure.  Inpatient Medications  .  amLODipine  5 mg Oral Daily  . antiseptic oral rinse  7 mL Mouth Rinse BID  . aspirin EC  325 mg Oral QHS  . budesonide  0.5 mg Nebulization BID  . carvedilol  25 mg Oral BID WC  . collagenase   Topical Daily  . digoxin  0.125 mg Oral q morning - 10a  . enoxaparin (LOVENOX) injection  40 mg Subcutaneous Q24H  . feeding supplement (ENSURE COMPLETE)  237 mL Oral BID BM  . feeding supplement (GLUCERNA SHAKE)  237 mL Oral BID BM  . ferrous sulfate  325 mg Oral QHS  . furosemide  40 mg Intravenous Q12H  . guaiFENesin  600 mg Oral BID  . insulin aspart  0-9 Units Subcutaneous TID WC  . insulin glargine  10 Units Subcutaneous Daily  . ipratropium-albuterol  3 mL Nebulization QID  . irbesartan  75 mg Oral Daily  . levofloxacin (LEVAQUIN) IV  500 mg  Intravenous Q24H  . megestrol  20 mg Oral BID  . methylPREDNISolone (SOLU-MEDROL) injection  60 mg Intravenous Q12H  . nitroGLYCERIN  0.2 mg Transdermal Q24H  . potassium chloride SA  20 mEq Oral Daily  . simvastatin  20 mg Oral QHS  . sodium chloride  3 mL Intravenous Q12H   Family History Family History  Problem Relation Age of Onset  . Heart failure Father   . Diabetes Father   . Heart disease Father   . Hyperlipidemia Father   . Hypertension Father   . Heart disease Mother     before age 65  . Hyperlipidemia Mother   . Hypertension Mother     Social History History   Social History  . Marital Status: Married    Spouse Name: N/A    Number of Children: N/A  . Years of Education: N/A   Occupational History  .      Retired Social research officer, government   Social History Main Topics  . Smoking status: Former Smoker -- 3.00 packs/day for 48 years    Types: Cigarettes    Quit date: 07/03/2002  . Smokeless tobacco: Never Used  . Alcohol Use: Yes     Comment: 05/27/2014 "a beer q 6 months or so"  . Drug Use: No  . Sexual Activity: No   Other Topics Concern  . Not on file   Social History Narrative   Married, wife [name]     Oxygen-continuous   I/O self cath since back surgery    Review of Systems  General:  No chills, fever, night sweats or weight changes.  Cardiovascular:  No chest pain, orthopnea, palpitations, paroxysmal nocturnal dyspnea. + dyspnea on exertion, edema Dermatological: Lots of thick skin bruising. Sacral ulcer on hydro treatment, still slow to heal Respiratory:  Thick green productive cough, dyspnea Urologic: No hematuria, dysuria Abdominal:   No nausea, vomiting, diarrhea, bright red blood per rectum, melena, or hematemesis Neurologic:  No visual changes, wkns, changes in mental status. All other systems reviewed and are otherwise negative except as noted above.  Physical Exam  Blood pressure 146/66, pulse 88, temperature 98.4 F (36.9 C), temperature source Oral, resp. rate 16, height $RemoveBe'5\' 7"'DkUkNzyiZ$  (1.702 m), weight 141 lb 8 oz (64.184 kg), SpO2 97 %.  General: Pleasant, NAD Psych: Normal affect. Neuro: Alert and oriented X 3. Moves all extremities spontaneously. HEENT: Normal  Neck: Supple without bruits +mildly elevated JVD Lungs:  Resp regular and unlabored, anterior exam shows bilateral rale, posterior exam markedly diminished breath sound with no obvious rale Heart: RRR no s3, s4, or murmurs. Abdomen: Soft, non-tender, non-distended, BS + x 4.  Extremities: No clubbing, cyanosis. DP/PT/Radials 2+ and equal bilaterally. 1+ dependent ankle edema. Lots of bruising, worse on R arm   Labs  Recent Labs  06/03/14 1330  TROPONINI <0.30   Lab Results  Component Value Date   WBC 11.5* 06/04/2014   HGB 8.9* 06/04/2014   HCT 28.2* 06/04/2014   MCV 93.4 06/04/2014   PLT 194 06/04/2014    Recent Labs Lab 06/03/14 1330 06/04/14 0513  NA 136* 137  K 4.9 4.5  CL 95* 95*  CO2 31 32  BUN 36* 36*  CREATININE 0.86 0.86  CALCIUM 8.8 8.6  PROT 6.6  --   BILITOT 0.6  --   ALKPHOS 65  --   ALT 23  --   AST 17  --   GLUCOSE 212* 234*   Lab Results  Component Value Date   CHOL 99  07/21/2013   HDL 37.70* 07/21/2013   LDLCALC 49 07/21/2013   TRIG 64.0 07/21/2013   Lab Results  Component Value Date   DDIMER * 12/17/2006    1.41        AT THE INHOUSE ESTABLISHED CUTOFF VALUE OF 0.48 ug/mL FEU, THIS ASSAY HAS BEEN DOCUMENTED IN THE LITERATURE TO HAVE   Radiology/Studies  Dg Chest Port 1 View  06/03/2014   CLINICAL DATA:  Shortness of breath  EXAM: PORTABLE CHEST - 1 VIEW  COMPARISON:  Portable chest x-ray of 05/29/2014  FINDINGS: The lungs are clear but slightly hyperaerated. Mediastinal and hilar contours are unchanged. The heart is enlarged and stable with AICD leads noted.  IMPRESSION: Stable cardiomegaly.  No active lung disease.   Electronically Signed   By: Ivar Drape M.D.   On: 06/03/2014 13:45   ECG: A-fib with ventricularly paced rhythm     ASSESSMENT AND PLAN  1. Acute on chronic respiratory insufficiency  - likely combination of COPD and CHF, although based on physical exam, he is only mildly fluid overloaded.   - Anterior exam does have some rale, although posterior exam showed markedly diminished breath sound w/o obvious rale. JVD not obvious, only mildly elevated. His thick green productive cough is more concerning of COPD exacerbation. CXR negative for acute process  - continue IV lasix for now. Would resume his $RemoveBe'80mg'TlunhXRKJ$  in AM and $Remo'40mg'Uwtde$  in PM PO lasix at home  2. Acute on chronic systolic heart failure, status post bi-V ICD  -  baseline EF 20-25% on Echo 08/08/2013  - continue xoreg and digoxin  3. Coronary artery disease s/p 3 v CABG 1998  - last cath 2006, severe native dx, patent LIMA and SVGs  4. Ischemic cardiomyopathy 5. Chronic a-fib: not on systemic anticoagulation due to bleeding history 6. significant peripheral vascular disease 7. DM 8. severe COPD on 24/7 2.5L home oxygen   Signed, Almyra Deforest, [place] 06/04/2014, 1:23 PM   The patient was seen, examined and discussed with Almyra Deforest, PA-C and I agree with the above.   78 year old  male, patient of Dr Tamala Julian, readmitted from rehab facility only after 1 day. He is being treated for COPD exacerbation and acute on chronic systolic CHF, LVEF 17-91%. He is actively wheezing and has fluid overload on physical exam. He is on home O2 and will need albuterol/atrovent treatment. In the settings of O2 depence and active wheezing I would switch carvedilol to cardizem 180 mg po CD. Also his albuterol should be swiched to Xopenex. He was started on Lasix 40 mg iv Q12H, I agree with the dose, home dose PO lasix 80 qam, 40 qpm. Crea is normal. The patient appears very frail, he has lost 25 lbs since August and 10 lbs in the last 3 weeks. He states that he has no appetite, but I wonder if he has any other reason for such dramatic weight loss, possible malignancy? He has chronic persistent a-fib, noton anticoagulation as he has h/o bleeding.    Dorothy Spark 06/04/2014

## 2014-06-04 NOTE — Progress Notes (Signed)
Results for DALLON, DACOSTA (MRN [id number]) as of 06/04/2014 10:12  Ref. Range 06/02/2014 11:46 06/02/2014 17:00 06/03/2014 17:30 06/03/2014 21:51 06/04/2014 08:33  Glucose-Capillary Latest Range: 70-99 mg/dL 233 (H) 289 (H) 224 (H) 317 (H) 228 (H)  CBGs continue to be greater than 180 mg/dl.  Recommend starting Lantus 10 units daily if CBGs continue to be greater than 180 mg/dl.  Continue Novolog SENSITIVE correction scale TID.  Will continue to follow while in hospital. Harvel Ricks RN BSN CDE

## 2014-06-04 NOTE — Clinical Social Work Note (Signed)
CSW met with patient and his wife at bedside- patient reports he was admitted to [facility] SNF for one night and then readmitted here. "They can't provide the medical care I needed'. He and wife are now indicating plans to go home at dc with [facility] and possibly some private duty care as well- "we want Tharon Aquas with [place] again."  CSW will advise RNCM of this plan and their requests. CSW will sign off- please re-consult of CSW needs arise.   Eduard Clos, MSW, [place]

## 2014-06-04 NOTE — Care Management Note (Signed)
    Page 1 of 2   06/09/2014     11:51:33 AM CARE MANAGEMENT NOTE 06/09/2014  Patient:  TANAY, MISURACA   Account Number:  [id number]  Date Initiated:  06/04/2014  Documentation initiated by:  Tomi Bamberger  Subjective/Objective Assessment:   dx copd  admit- from [facility] SN[facility], does not want to go back, wants to go home per CSW. Pt has had [facility] in past.     Action/Plan:   Anticipated DC Date:  06/06/2014   Anticipated DC Plan:  [place] referral  Clinical Social Worker      DC Forensic scientist  CM consult      [facility] Choice  HOME HEALTH   Choice offered to / List presented to:  C-1 Patient        [place] arranged  HH-1 RN  [place]   Status of service:  Completed, signed off Medicare Important Message given?  YES (If response is "NO", the following Medicare IM given date fields will be blank) Date Medicare IM given:  06/08/2014 Medicare IM given by:  Tomi Bamberger Date Additional Medicare IM given:   Additional Medicare IM given by:    Discharge Disposition:  [place]  Per UR Regulation:  Reviewed for med. necessity/level of care/duration of stay  If discussed at [place] of Stay Meetings, dates discussed:    Comments:  06/09/14 [place], BSN [phone] Patient is for dc today, patient will be going with [place] for [facility], NCM spoke with wife and patient  to make sure this was ok with them to go with [name], they said yes.  NCM left vm for [facility] with [name] that patient is for dc today. Wound Vac is in the patient's room and wife has brought his oxygen with her for him to use as they travel.  06/04/14 [place], BSN [phone] patient is from [facility] SNF, per CSW patient does not want to go back , wants to go home, states patient has had [facility] before and would like them again, requesting  [place]. NCM confirmed with patient  this information and he states he woul like to have RN, PT, OT, aide and Education officer, museum, NCM made referral to [facility]  for [place], RN, OT, aid  and Education officer, museum,  [place] notified and requested [place] for patient.  Soc will begin 24-48 hrs post dc.

## 2014-06-04 NOTE — Evaluation (Signed)
Physical Therapy Evaluation Patient Details Name: [name] MRN: [id number] DOB: 1935-09-07 Today's Date: 06/04/2014   History of Present Illness  pt is a 78 y/o male With a history of CHF with an EF of 20-25%, pacemaker/I CD placement, diabetes mellitus, COPD, presented to the emergency department from [facility] facility for shortness of breath.   Clinical Impression  Pt admitted with/for SOB, ?CHF.  Pt currently limited functionally due to the problems listed below.  (see problems list.)  Pt will benefit from PT to maximize function and safety to be able to get home safely with available assist of wife. .     Follow Up Recommendations Home health PT;Other (comment) (pt no longer wishes to go to snf for rehab.)    Equipment Recommendations  None recommended by PT    Recommendations for Other Services       Precautions / Restrictions Precautions Precautions: Fall Restrictions Weight Bearing Restrictions: No      Mobility  Bed Mobility Overal bed mobility: Needs Assistance Bed Mobility: Sidelying to Sit;Rolling Rolling: Min guard Sidelying to sit: Min guard;HOB elevated       General bed mobility comments: needs use of the rail  Transfers Overall transfer level: Needs assistance Equipment used: Rolling walker (2 wheeled) Transfers: Sit to/from Stand Sit to Stand: Min guard         General transfer comment: close guard for safety, verbal cues for hand placement  Ambulation/Gait Ambulation/Gait assistance: Min guard Ambulation Distance (Feet): 45 Feet Assistive device: Rolling walker (2 wheeled) Gait Pattern/deviations: Step-through pattern Gait velocity: Decreased   General Gait Details: little knee flexion,  quick to fatigue and get dyspneic  Stairs            Wheelchair Mobility    Modified Rankin (Stroke Patients Only)       Balance Overall balance assessment: Needs assistance   Sitting balance-Leahy Scale: Good        Standing balance-Leahy Scale: Fair                               Pertinent Vitals/Pain Pain Assessment: No/denies pain    Home Living Family/patient expects to be discharged to:: Private residence Living Arrangements: Spouse/significant other Available Help at Discharge: Family;Available 24 hours/day Type of Home: House Home Access: Stairs to enter Entrance Stairs-Rails: Right;Left;Can reach both Entrance Stairs-Number of Steps: 3-5 depending on which door he goes in Home Layout: Two level;Able to live on main level with bedroom/bathroom Home Equipment: Gilford Rile - 2 wheels;Cane - single point;Wheelchair - manual;Shower seat      Prior Function Level of Independence: Independent with assistive device(s)         Comments: Pt states his wife occasionally assists him but he was independent - likes for her to be there just in case when he is showering. States that he tries to exercise regularly with hand weights and walking outside.      Hand Dominance   Dominant Hand: Right    Extremity/Trunk Assessment   Upper Extremity Assessment: Defer to OT evaluation           Lower Extremity Assessment: Generalized weakness;RLE deficits/detail;LLE deficits/detail RLE Deficits / Details: grossly 4-/5 hip flexion,hams, 4/5 quads, df/pf 2+/5 LLE Deficits / Details: grossly 4-/5 overall, df/pf 3+/5  Cervical / Trunk Assessment: Kyphotic  Communication   Communication: No difficulties  Cognition Arousal/Alertness: Awake/alert Behavior During Therapy: WFL for tasks assessed/performed;Anxious Overall Cognitive Status: Within  Functional Limits for tasks assessed                      General Comments      Exercises        Assessment/Plan    PT Assessment Patient needs continued PT services  PT Diagnosis Difficulty walking;Generalized weakness   PT Problem List Decreased strength;Decreased range of motion;Decreased balance;Decreased activity  tolerance;Decreased mobility;Decreased knowledge of use of DME;Decreased safety awareness;Decreased knowledge of precautions;Cardiopulmonary status limiting activity  PT Treatment Interventions DME instruction;Gait training;Functional mobility training;Stair training;Therapeutic activities;Therapeutic exercise;Neuromuscular re-education;Patient/family education   PT Goals (Current goals can be found in the Care Plan section) Acute Rehab PT Goals Patient Stated Goal: Return home independently. PT Goal Formulation: With patient/family Time For Goal Achievement: 06/19/14 Potential to Achieve Goals: Good    Frequency Min 3X/week   Barriers to discharge        Co-evaluation               End of Session   Activity Tolerance: Patient limited by fatigue Patient left: in chair;with chair alarm set;with call bell/phone within reach;with family/visitor present Nurse Communication: Mobility status         Time: 1102-1129 PT Time Calculation (min) (ACUTE ONLY): 27 min   Charges:   PT Evaluation $Initial PT Evaluation Tier I: 1 Procedure PT Treatments $Gait Training: 8-22 mins   PT G Codes:          Abena Erdman, Tessie Fass 06/04/2014, 2:42 PM 06/04/2014  [name], PT [phone]  (pager)

## 2014-06-04 NOTE — Evaluation (Signed)
Occupational Therapy Evaluation and Discharge Patient Details Name: [name] MRN: [id number] DOB: 12/28/1935 Today's Date: 06/04/2014    History of Present Illness 78 yo male presented to the emergency department from [place] facility for shortness of breath after only being there for 1 day. PMHx: CHF with an EF of 20-25%, pacemaker/I CD placement, diabetes mellitus, COPD,    Clinical Impression   This 78 yo male admitted with above presents to acute OT with all education completed, we will sign off.     Follow Up Recommendations  No OT follow up    Equipment Recommendations  None recommended by OT       Precautions / Restrictions Precautions Precautions: Fall Restrictions Weight Bearing Restrictions: No      Mobility Bed Mobility Overal bed mobility: Needs Assistance Bed Mobility: Sidelying to Sit;Rolling Rolling: Min guard Sidelying to sit: Min guard;HOB elevated       General bed mobility comments: Pt up in recliner upon my arrival  Transfers Overall transfer level: Needs assistance Equipment used: Rolling walker (2 wheeled) Transfers: Sit to/from Stand Sit to Stand: Supervision         General transfer comment: close guard for safety, verbal cues for hand placement    Balance Overall balance assessment: Needs assistance   Sitting balance-Leahy Scale: Good       Standing balance-Leahy Scale: Fair                              ADL Overall ADL's : Needs assistance/impaired Eating/Feeding: Independent;Sitting   Grooming: Set up;Sitting   Upper Body Bathing: Set up;Sitting   Lower Body Bathing: Supervison/ safety;Sit to/from stand   Upper Body Dressing : Set up;Sitting   Lower Body Dressing: Supervision/safety;Sit to/from stand   Toilet Transfer: Supervision/safety;Ambulation;RW (recliner>6 steps forward and back to recliner)   Toileting- Clothing Manipulation and Hygiene: Supervision/safety;Sit to/from stand          General ADL Comments: Pt is very aware of purse lipped breathing due to having gone through cardiac rehab more than once. He also is aware that he should take warm (not hot showers) thus it will interfere with his breathing.               Pertinent Vitals/Pain Pain Assessment: No/denies pain     Hand Dominance Right   Extremity/Trunk Assessment Upper Extremity Assessment Upper Extremity Assessment: Overall WFL for tasks assessed     Communication Communication Communication: No difficulties   Cognition Arousal/Alertness: Awake/alert Behavior During Therapy: WFL for tasks assessed/performed Overall Cognitive Status: Within Functional Limits for tasks assessed                                Home Living Family/patient expects to be discharged to:: Private residence Living Arrangements: Spouse/significant other Available Help at Discharge: Family;Available 24 hours/day Type of Home: House Home Access: Stairs to enter [employer] of Steps: 3-5 depending on which door he goes in Entrance Stairs-Rails: Right;Left;Can reach both Home Layout: Two level;Able to live on main level with bedroom/bathroom     Bathroom Shower/Tub: Walk-in shower;Door   [employer] Toilet: Standard     Home Equipment: Environmental consultant - 2 wheels;Cane - single point;Wheelchair - manual;Shower seat;Bedside commode;Grab bars - tub/shower;Hand held shower head          Prior Functioning/Environment Level of Independence: Independent with assistive device(s)  Comments: Pt states his wife occasionally assists him but he was independent - likes for her to be there just in case when he is showering. States that he tries to exercise regularly with hand weights and walking outside. He says his wife is in great shape--that she is an avid Firefighter    OT Diagnosis: Generalized weakness         OT Goals(Current goals can be found in the care plan section) Acute Rehab OT  Goals Patient Stated Goal: home sooner than later  OT Frequency:                End of Session Equipment Utilized During Treatment: Rolling walker;Oxygen (on 3 liters with O2 at 96%)  Activity Tolerance: Patient tolerated treatment well Patient left: in chair;with call bell/phone within reach   Time: 1228-1241 OT Time Calculation (min): 13 min Charges:  OT General Charges $OT Visit: 1 Procedure OT Evaluation $Initial OT Evaluation Tier I: 1 Procedure OT Treatments $Self Care/Home Management : 8-22 mins   [name] [phone] 06/04/2014, 2:51 PM

## 2014-06-04 NOTE — Plan of Care (Signed)
Problem: ICU Phase Progression Outcomes Goal: Dyspnea controlled at rest Outcome: Not Progressing     

## 2014-06-05 ENCOUNTER — Other Ambulatory Visit: Payer: Self-pay | Admitting: Interventional Cardiology

## 2014-06-05 DIAGNOSIS — J439 Emphysema, unspecified: Secondary | ICD-10-CM

## 2014-06-05 LAB — COMPREHENSIVE METABOLIC PANEL
ALBUMIN: 2.5 g/dL — AB (ref 3.5–5.2)
ALT: 16 U/L (ref 0–53)
AST: 11 U/L (ref 0–37)
Alkaline Phosphatase: 60 U/L (ref 39–117)
Anion gap: 9 (ref 5–15)
BUN: 41 mg/dL — ABNORMAL HIGH (ref 6–23)
CALCIUM: 8.6 mg/dL (ref 8.4–10.5)
CO2: 33 mEq/L — ABNORMAL HIGH (ref 19–32)
Chloride: 97 mEq/L (ref 96–112)
Creatinine, Ser: 0.95 mg/dL (ref 0.50–1.35)
GFR calc non Af Amer: 79 mL/min — ABNORMAL LOW (ref >90)
GLUCOSE: 229 mg/dL — AB (ref 70–99)
POTASSIUM: 4.7 meq/L (ref 3.7–5.3)
SODIUM: 139 meq/L (ref 137–147)
TOTAL PROTEIN: 6.2 g/dL (ref 6.0–8.3)
Total Bilirubin: 0.4 mg/dL (ref 0.3–1.2)

## 2014-06-05 LAB — CBC
HEMATOCRIT: 28.4 % — AB (ref 39.0–52.0)
Hemoglobin: 8.9 g/dL — ABNORMAL LOW (ref 13.0–17.0)
MCH: 29.5 pg (ref 26.0–34.0)
MCHC: 31.3 g/dL (ref 30.0–36.0)
MCV: 94 fL (ref 78.0–100.0)
Platelets: 214 10*3/uL (ref 150–400)
RBC: 3.02 MIL/uL — AB (ref 4.22–5.81)
RDW: 14.2 % (ref 11.5–15.5)
WBC: 14 10*3/uL — ABNORMAL HIGH (ref 4.0–10.5)

## 2014-06-05 LAB — GLUCOSE, CAPILLARY
Glucose-Capillary: 219 mg/dL — ABNORMAL HIGH (ref 70–99)
Glucose-Capillary: 271 mg/dL — ABNORMAL HIGH (ref 70–99)
Glucose-Capillary: 409 mg/dL — ABNORMAL HIGH (ref 70–99)
Glucose-Capillary: 335 mg/dL — ABNORMAL HIGH (ref 70–99)

## 2014-06-05 MED ORDER — DEXTROSE 5 % IV SOLN
1.0000 g | Freq: Two times a day (BID) | INTRAVENOUS | Status: DC
  Administered 2014-06-05 – 2014-06-08 (×6): 1 g via INTRAVENOUS
  Filled 2014-06-05 (×8): qty 1

## 2014-06-05 MED ORDER — ATORVASTATIN CALCIUM 10 MG PO TABS
10.0000 mg | ORAL_TABLET | Freq: Every day | ORAL | Status: DC
  Administered 2014-06-05 – 2014-06-08 (×4): 10 mg via ORAL
  Filled 2014-06-05 (×5): qty 1

## 2014-06-05 MED ORDER — GUAIFENESIN ER 600 MG PO TB12
1200.0000 mg | ORAL_TABLET | Freq: Two times a day (BID) | ORAL | Status: DC
  Administered 2014-06-05 – 2014-06-09 (×9): 1200 mg via ORAL
  Filled 2014-06-05 (×10): qty 2

## 2014-06-05 MED ORDER — LEVALBUTEROL HCL 1.25 MG/0.5ML IN NEBU
1.2500 mg | INHALATION_SOLUTION | Freq: Three times a day (TID) | RESPIRATORY_TRACT | Status: DC
  Administered 2014-06-06 – 2014-06-09 (×5): 1.25 mg via RESPIRATORY_TRACT
  Filled 2014-06-05 (×13): qty 0.5

## 2014-06-05 MED ORDER — LEVALBUTEROL HCL 0.63 MG/3ML IN NEBU
0.6300 mg | INHALATION_SOLUTION | Freq: Four times a day (QID) | RESPIRATORY_TRACT | Status: DC | PRN
  Administered 2014-06-06 – 2014-06-08 (×6): 0.63 mg via RESPIRATORY_TRACT
  Filled 2014-06-05 (×4): qty 3

## 2014-06-05 MED ORDER — ALPRAZOLAM 0.25 MG PO TABS
0.2500 mg | ORAL_TABLET | Freq: Four times a day (QID) | ORAL | Status: DC | PRN
  Administered 2014-06-05 – 2014-06-08 (×7): 0.25 mg via ORAL
  Filled 2014-06-05 (×5): qty 1

## 2014-06-05 MED ORDER — IPRATROPIUM BROMIDE 0.02 % IN SOLN
0.5000 mg | Freq: Three times a day (TID) | RESPIRATORY_TRACT | Status: DC
  Administered 2014-06-06 – 2014-06-09 (×10): 0.5 mg via RESPIRATORY_TRACT
  Filled 2014-06-05 (×10): qty 2.5

## 2014-06-05 MED ORDER — VANCOMYCIN HCL 500 MG IV SOLR
500.0000 mg | Freq: Two times a day (BID) | INTRAVENOUS | Status: DC
  Administered 2014-06-05 – 2014-06-06 (×4): 500 mg via INTRAVENOUS
  Filled 2014-06-05 (×7): qty 500

## 2014-06-05 NOTE — Plan of Care (Signed)
Problem: Phase I Progression Outcomes Goal: Dyspnea controlled at rest Outcome: Progressing     

## 2014-06-05 NOTE — Progress Notes (Signed)
Medicare Important Message given?  YES (If response is "NO", the following Medicare IM given date fields will be blank) Date Medicare IM given:   Medicare IM given by:  Valentine Barney 

## 2014-06-05 NOTE — Progress Notes (Signed)
PROGRESS NOTE  [name]:[id number] DOB: 06-05-1936 DOA: 06/03/2014 PCP: Dorian Heckle, MD  HPI: 78 y.o. male with a history of CHF with an EF of 20-25%, pacemaker/I CD placement, diabetes mellitus, COPD, presented to the emergency department from [place] facility for shortness of breath.  Subjective/ 24 H Interval events Patient reports Xanax helps his breathing more than anything else. Plans to return home.  Refuses all SNF discharge plans. He feels a little bit stronger today, was able to work with physical therapy this morning.  Assessment/Plan: Principal Problem:   Acute respiratory failure Active Problems:   Dyslipidemia   Essential hypertension   Cardiomyopathy, ischemic- EF 20-25% 2D 08/08/13   Persistent atrial fibrillation   Decubitus ulcer of coccygeal region-excision and closure Nov 2015   General weakness   Weakness generalized   Protein-calorie malnutrition, severe   Acute on chronic respiratory failure   COPD exacerbation   COPD (chronic obstructive pulmonary disease)  Acute on chronic respiratory failure -this is likely multifactorial from acute on chronic systolic heart failure as well as HCAP and possible A on C COPD.   -Uses 2.5 L of oxygen at home;will continue supplemental oxygen to maintain his saturations above 90%  Acute on chronic systolic CHF exacerbation - Echocardiogram: February 2015: EF 20-25% - Patient with evidence of fluid overload with lower extremity edema, Left poster expiratory wheeze. His BNP was elevated to 4000 higher than one week ago at 1500. - Cardiology consult, appreciate input - Continue IV Lasix 40 mg IV twice a day - Strict I's and O's, daily weights.  HCAP Had been treating with Levaquin for Acute on chronic COPD exacerbation.  Escalated to Vanc and Cefepime on 12/4 as patient is not improving and continues to cough up green sputum.  Patient hospitalized for 5 days in November. Receiving IV steroids,  flutter valve, nebulizers. - Slight improvement today  COPD exacerbation -Treatment and plan as above  Sacral wound - Appreciate PT and Wound Care consulatations - PT performing hydrotherapy.  They report the patient would benefit from several more hydrotherapy sessions. - Outpatient wound center appointment scheduled 12/11 at 12:45. - Wound vac placed. 12/4.  Scheduled to be changed on Monday.  Generalized weakness/deconditioning - Patient refuses SNF.  Will need Home health services. - PT and OT consulted  Severe protein calorie malnutrition - Nutrition consulted - Continue nutritional supplementation - Will start Megace  Essential hypertension - Stable, continue Lasix, ARB, amlodipine  Dyslipidemia - Continue statin  Diabetes mellitus, type II - Diet controlled - Will place on ISS with CBG monitoring - Patient has lost 50 pounds in order "to cure" his diabetes   Diet: heart healthy Fluids: none DVT Prophylaxis: Lovenox  Code Status: DNR Family Communication:  Disposition Plan: inpatient, home when ready   Consultants:  Cardiology   Procedures:  None    Antibiotics Antibiotics Given (last 72 hours)    Date/Time Action Medication Dose Rate   06/03/14 1842 Given   levofloxacin (LEVAQUIN) IVPB 500 mg 500 mg 100 mL/hr      Studies  No results found. Objective  Filed Vitals:   06/04/14 2218 06/05/14 0309 06/05/14 0500 06/05/14 0551  BP:    118/58  Pulse:    80  Temp:    98.3 F (36.8 C)  TempSrc:    Oral  Resp:    18  Height:      Weight:   64.501 kg (142 lb 3.2 oz)   SpO2: 97% 100%  93%    Intake/Output Summary (Last 24 hours) at 06/05/14 1040 Last data filed at 06/05/14 7412  Gross per 24 hour  Intake    480 ml  Output   2700 ml  Net  -2220 ml   Filed Weights   06/03/14 1712 06/04/14 0607 06/05/14 0500  Weight: 63.005 kg (138 lb 14.4 oz) 64.184 kg (141 lb 8 oz) 64.501 kg (142 lb 3.2 oz)    Exam:  General:  NAD, tachypneic but  comfortable, lying in bed.  Cardiovascular: RRR,  And 2+ pitting LE edema  Respiratory: decreased breath sounds anteriorly, expiratory wheeze on posterior left.  Abdomen: soft, non tender, thin, no masses  MSK: 2+ LE pitting edema  Neuro: non focal  Skin:  Sacral wound with wound vac.   Data Reviewed: Basic Metabolic Panel:  Recent Labs Lab 05/30/14 0318 06/01/14 0344 06/03/14 1330 06/04/14 0513 06/05/14 0600  NA 139 134* 136* 137 139  K 3.9 4.2 4.9 4.5 4.7  CL 98 92* 95* 95* 97  CO2 30 29 31  32 33*  GLUCOSE 117* 233* 212* 234* 229*  BUN 31* 39* 36* 36* 41*  CREATININE 0.80 0.89 0.86 0.86 0.95  CALCIUM 8.4 8.6 8.8 8.6 8.6  MG  --   --   --  2.5  --   PHOS  --   --   --  3.3  --    Liver Function Tests:  Recent Labs Lab 06/03/14 1330 06/05/14 0600  AST 17 11  ALT 23 16  ALKPHOS 65 60  BILITOT 0.6 0.4  PROT 6.6 6.2  ALBUMIN 2.7* 2.5*   CBC:  Recent Labs Lab 05/30/14 0318 06/01/14 0344 06/03/14 1330 06/04/14 0513 06/05/14 0600  WBC 5.1 10.5 12.6* 11.5* 14.0*  NEUTROABS  --   --  11.7*  --   --   HGB 8.6* 8.5* 9.8* 8.9* 8.9*  HCT 27.2* 26.7* 29.4* 28.2* 28.4*  MCV 95.8 91.8 93.6 93.4 94.0  PLT 220 211 165 194 214   Cardiac Enzymes:  Recent Labs Lab 05/30/14 0318 06/03/14 1330  CKTOTAL 29  --   TROPONINI  --  <0.30   BNP (last 3 results)  Recent Labs  05/27/14 1430 06/03/14 1328  PROBNP 1578.0* 4268.0*   CBG:  Recent Labs Lab 06/04/14 0833 06/04/14 1215 06/04/14 1858 06/04/14 2203 06/05/14 0759  GLUCAP 228* 326* 230* 233* 219*    Recent Results (from the past 240 hour(s))  Blood Culture (routine x 2)     Status: None   Collection Time: 05/27/14  2:30 PM  Result Value Ref Range Status   Specimen Description BLOOD RIGHT ARM  Final   Special Requests BOTTLES DRAWN AEROBIC AND ANAEROBIC 5CC  Final   Culture  Setup Time   Final    05/27/2014 21:42 Performed at [place]   Final    NO GROWTH 5  DAYS Performed at [facility]    Report Status 06/02/2014 FINAL  Final  Culture, blood (routine x 2)     Status: None   Collection Time: 05/27/14  3:17 PM  Result Value Ref Range Status   Specimen Description BLOOD RIGHT HAND  Final   Special Requests BOTTLES DRAWN AEROBIC AND ANAEROBIC 10CC  Final   Culture  Setup Time   Final    05/28/2014 04:48 Performed at [place]   Final    NO GROWTH 5 DAYS Performed at [facility]    Report Status 06/03/2014 FINAL  Final  Urine culture     Status: None   Collection Time: 05/27/14  3:35 PM  Result Value Ref Range Status   Specimen Description URINE, CLEAN CATCH  Final   Special Requests NONE  Final   Culture  Setup Time   Final    05/28/2014 05:41 Performed at [place]   Final    2,000 COLONIES/ML Performed at [facility]    Culture   Final    INSIGNIFICANT GROWTH Performed at [facility]    Report Status 05/29/2014 FINAL  Final  Urine culture     Status: None   Collection Time: 05/30/14  1:40 PM  Result Value Ref Range Status   Specimen Description URINE, CATHETERIZED  Final   Special Requests NONE  Final   Culture  Setup Time   Final    05/30/2014 14:10 Performed at [place] Performed at [facility]   Final   Culture NO GROWTH Performed at [facility]   Final   Report Status 05/31/2014 FINAL  Final     Scheduled Meds: . amLODipine  5 mg Oral Daily  . antiseptic oral rinse  7 mL Mouth Rinse BID  . aspirin EC  325 mg Oral QHS  . budesonide  0.5 mg Nebulization BID  . collagenase   Topical Daily  . digoxin  0.125 mg Oral q morning - 10a  . diltiazem  180 mg Oral Daily  . enoxaparin (LOVENOX) injection  40 mg Subcutaneous Q24H  . feeding supplement (ENSURE COMPLETE)  237 mL Oral BID BM  . feeding supplement (GLUCERNA SHAKE)  237 mL Oral BID BM  . ferrous sulfate  325 mg Oral  QHS  . furosemide  40 mg Intravenous Q12H  . guaiFENesin  600 mg Oral BID  . insulin aspart  0-9 Units Subcutaneous TID WC  . insulin glargine  10 Units Subcutaneous Daily  . irbesartan  75 mg Oral Daily  . levalbuterol  1.25 mg Nebulization Q6H  . megestrol  20 mg Oral BID  . methylPREDNISolone (SOLU-MEDROL) injection  60 mg Intravenous Q12H  . nitroGLYCERIN  0.2 mg Transdermal Q24H  . potassium chloride SA  20 mEq Oral Daily  . simvastatin  20 mg Oral QHS  . sodium chloride  3 mL Intravenous Q12H   Continuous Infusions: . sodium chloride      Time spent: 35 minutes  Imogene Burn, [facility] Pager [phone]. If 7 PM - 7 AM, please contact night-coverage at [email or web address], password [facility] 06/05/2014, 10:40 AM  LOS: 2 days

## 2014-06-05 NOTE — Consult Note (Signed)
WOC wound follow up Wound type: Unstageable pressure ulcer, now evolved with hydrotherapy to Stage IV Pressure ulcer Measurement: 5cm x 3cm x 2.5cm  Wound bed: 100% clean, non granular, hydrotherapy has just completed treatment and has contacted WOC for evaluation of wound and possible placement of wound VAC Drainage (amount, consistency, odor) minimal, serosanguinous  Periwound: intact  Dressing procedure/placement/frequency:  Wound is clean, ready for NPWT VAC.  Placement of one pc. Black granufoam, drape of skin for bridge to the right hip. Used 1/2 2" barrier ring between rectum and wound bed to assist in seal of VAC.  Covered entire area with drape. Seal at 181mmHG. Pt tolerated well.  Will need HHRN for NPWT VAC changes M/W/F and follow up with both Dr. Hassell Done and in the wound care center (per Dr. Earlie Server last note) at the time of discharge.  WOC will follow along with you for NPWT VAC dressing M/W/F due to dressing complexity.  Chaelyn Bunyan [place] RN,CWOCN  [phone]

## 2014-06-05 NOTE — Progress Notes (Addendum)
ANTIBIOTIC CONSULT NOTE - INITIAL  Pharmacy Consult for Vancomycin and Cefepime Indication: pneumonia  No Known Allergies     Medications:  Patient now taking both diltiazem and Norvasc -- consider stopping Norvasc?       Patient Measurements: Height: 5\' 7"  (170.2 cm) Weight: 142 lb 3.2 oz (64.501 kg) IBW/kg (Calculated) : 66.1  Vital Signs: Temp: 98.3 F (36.8 C) (12/04 0551) Temp Source: Oral (12/04 0551) BP: 118/58 mmHg (12/04 0551) Pulse Rate: 80 (12/04 0551) Intake/Output from previous day: 12/03 0701 - 12/04 0700 In: 720 [P.O.:720] Out: 2700 [Urine:2700] Intake/Output from this shift:    Labs:  Recent Labs  06/03/14 1330 06/04/14 0513 06/05/14 0600  WBC 12.6* 11.5* 14.0*  HGB 9.8* 8.9* 8.9*  PLT 165 194 214  CREATININE 0.86 0.86 0.95   Estimated Creatinine Clearance: 60.4 mL/min (by C-G formula based on Cr of 0.95). No results for input(s): VANCOTROUGH, VANCOPEAK, VANCORANDOM, GENTTROUGH, GENTPEAK, GENTRANDOM, TOBRATROUGH, TOBRAPEAK, TOBRARND, AMIKACINPEAK, AMIKACINTROU, AMIKACIN in the last 72 hours.   Microbiology: Recent Results (from the past 720 hour(s))  Blood Culture (routine x 2)     Status: None   Collection Time: 05/27/14  2:30 PM  Result Value Ref Range Status   Specimen Description BLOOD RIGHT ARM  Final   Special Requests BOTTLES DRAWN AEROBIC AND ANAEROBIC 5CC  Final   Culture  Setup Time   Final    05/27/2014 21:42 Performed at [place]   Final    NO GROWTH 5 DAYS Performed at [facility]    Report Status 06/02/2014 FINAL  Final  Culture, blood (routine x 2)     Status: None   Collection Time: 05/27/14  3:17 PM  Result Value Ref Range Status   Specimen Description BLOOD RIGHT HAND  Final   Special Requests BOTTLES DRAWN AEROBIC AND ANAEROBIC 10CC  Final   Culture  Setup Time   Final    05/28/2014 04:48 Performed at [place]   Final    NO GROWTH 5 DAYS Performed at  [facility]    Report Status 06/03/2014 FINAL  Final  Urine culture     Status: None   Collection Time: 05/27/14  3:35 PM  Result Value Ref Range Status   Specimen Description URINE, CLEAN CATCH  Final   Special Requests NONE  Final   Culture  Setup Time   Final    05/28/2014 05:41 Performed at [place]   Final    2,000 COLONIES/ML Performed at [facility]    Culture   Final    INSIGNIFICANT GROWTH Performed at [facility]    Report Status 05/29/2014 FINAL  Final  Urine culture     Status: None   Collection Time: 05/30/14  1:40 PM  Result Value Ref Range Status   Specimen Description URINE, CATHETERIZED  Final   Special Requests NONE  Final   Culture  Setup Time   Final    05/30/2014 14:10 Performed at [place] Performed at [facility]   Final   Culture NO GROWTH Performed at [facility]   Final   Report Status 05/31/2014 FINAL  Final    Medical History: Past Medical History  Diagnosis Date  . Hypokalemia   . PVD (peripheral vascular disease)   . HTN (hypertension)   . CHF (congestive heart failure)   .  Ventricular tachycardia   . Ischemic heart disease, chronic   . Dyslipidemia   . CAD (coronary artery disease)   . On home oxygen therapy     "~ 2.5L; pretty much 24/7" (05/27/2014)  . Pacemaker   . Self-catheterizes urinary bladder     since back surgery.  6- 7 times a day. (05/27/2014)  . Difficult intubation     pt states "not aware of difficult intubation, never told"  . Heart murmur     dx 1974  . Shortness of breath   . Head injury, closed, with brief LOC 2013  . Neurogenic bladder     since back surgery - self caths  . Neurogenic bowel     digitally stimulates daily.  Marland Kitchen COPD (chronic obstructive pulmonary disease)      PT USES OXYGEN 24 HRS A DAY - 2 L NASAL CANNULA  . Emphysema   . Anemia     SERUM MONOCLONAL IgM KAPPA PROTEIN - NO  CLINICAL EVIDENCE FOR PROGRESSION TO MULTIPLE MYELOMA OR OTHER PROLIFERATIVE DISORDER - PER OFFICE NOTE DR. SHERRILL DATED 12/30/13  . Pneumonia X 2  . Borderline type 2 diabetes mellitus   . AICD (automatic cardioverter/defibrillator) present   . TIA (transient ischemic attack) 1980's    "they say I did; I don't believe it"  . Colon cancer   . MRSA infection     Assessment: 78 year old male with a history of CHF (EF=20 to 25%), pacemaker/ICD placement, DM, COPD presented to the ER from [facility] with SOB.  He was started on Levaquin beginning 12/3 without much improvement.  Pharmacy asked to escalate antibiotics to cefepime and vancomycin since he is not improving and continues to cough up green sputum.  Goal of Therapy:  Vancomycin trough level 15-20 mcg/ml  Appropriate cefepime dosing  Plan:  Cefepime 1 gram iv Q 12 hours Vancomycin 500 mg iv Q 12 hours Follow Scr, cultures, fever trend  Thank you. Anette Guarneri, PharmD [phone]  06/05/2014,10:55 AM

## 2014-06-05 NOTE — Progress Notes (Signed)
Results for MUKESH, KORNEGAY (MRN [id number]) as of 06/05/2014 11:40  Ref. Range 06/04/2014 08:33 06/04/2014 12:15 06/04/2014 18:58 06/04/2014 22:03 06/05/2014 07:59  Glucose-Capillary Latest Range: 70-99 mg/dL 228 (H) 326 (H) 230 (H) 233 (H) 219 (H)  CBGs continue to be greater than 180 mg/dl.  Recommend increasing Lantus to 15 units daily if CBGs continue to be elevated.  Continue Novolog SENSITIVE correction scale TID. Will continue to follow while in hospital. Harvel Ricks RN BSN CDE

## 2014-06-05 NOTE — Progress Notes (Signed)
Physical Therapy Wound Treatment Patient Details  Name: [name] MRN: [id number] Date of Birth: 1935-10-20  Today's Date: 06/05/2014 Time: 1032-1059 Time Calculation (min): 27 min  Subjective  Subjective: I'm scared to go home too soon Patient and Family Stated Goals: Get this wound to heal up. Prior Treatments: many treatments over the year  Pain Score: Pain Score: 0-No pain  Wound Assessment  Pressure Ulcer 08/08/13 Stage II -  Partial thickness loss of dermis presenting as a shallow open ulcer with a red, pink wound bed without slough. (Active)     Pressure Ulcer 06/03/14 Unstageable - Full thickness tissue loss in which the base of the ulcer is covered by slough (yellow, tan, gray, green or brown) and/or eschar (tan, brown or black) in the wound bed. (Active)  Dressing Type Other (Comment) 06/05/2014 11:06 AM  Dressing Changed 06/05/2014 11:06 AM  Dressing Change Frequency Daily 06/05/2014 11:06 AM  State of Healing Early/partial granulation 06/05/2014 11:06 AM  Site / Wound Assessment Yellow;Pink;Red 06/05/2014 11:06 AM  % Wound base Red or Granulating 85% 06/05/2014 11:06 AM  % Wound base Yellow 15% 06/05/2014 11:06 AM  % Wound base Black 0% 06/05/2014 11:06 AM  % Wound base Other (Comment) 0% 06/05/2014 11:06 AM  Peri-wound Assessment Intact 06/05/2014 11:06 AM  Wound Length (cm) 5 cm 06/05/2014 11:06 AM  Wound Width (cm) 3 cm 06/05/2014 11:06 AM  Wound Depth (cm) 2.5 cm 06/05/2014 11:06 AM  Tunneling (cm) .05 06/03/2014  8:50 PM  Undermining (cm) 0 06/03/2014  8:50 PM  Margins Unattached edges (unapproximated) 06/05/2014 11:06 AM  Drainage Amount None 06/05/2014 11:06 AM  Drainage Description Serosanguineous 06/05/2014 11:06 AM  Treatment Hydrotherapy (Ultrasonic mist);Debridement (Selective);Cleansed;Negative pressure wound therapy 06/05/2014 11:06 AM     Wound / Incision (Open or Dehisced) Other (Comment) Arm Right;Lower;Lateral (Active)  Dressing Type Foam 06/03/2014  5:15 PM   Dressing Changed Changed 06/03/2014  5:15 PM  Dressing Status Clean;Dry;Intact 06/03/2014  5:15 PM  Dressing Change Frequency Every 5 days 06/02/2014  8:26 AM     Wound / Incision (Open or Dehisced) 06/03/14 Other (Comment) Sacrum Mid debrided nonhealing sacral wound (Active)  Dressing Type Moist to dry 06/03/2014  5:15 PM  Dressing Changed Reinforced 06/03/2014  5:15 PM  Dressing Status Old drainage 06/03/2014  5:15 PM  Site / Wound Assessment Yellow;Pink 06/03/2014  5:15 PM  Drainage Amount Minimal 06/03/2014  5:15 PM  Drainage Description Serosanguineous 06/03/2014  5:15 PM  Treatment Hydrotherapy (Pulse lavage);Cleansed;Debridement (Selective) 06/03/2014  5:15 PM   Hydrotherapy Ultrasonic mist  - wound location: sacrum Ultrasonic mist at 35KHz (+/-3KHz) at ___ percent: 100 % Ultrasonic mist therapy minutes: 7 min Selective Debridement Selective Debridement - Location: sacrum Selective Debridement - Tools Used: Forceps;Scissors Selective Debridement - Tissue Removed: eschar, fibrin   Wound Assessment and Plan  Wound Therapy - Assess/Plan/Recommendations Wound Therapy - Clinical Statement: Pt can benefit from hydrotherapy and selective debridement to take this wound from chronic to more acute to promote progress toward healing. Wound Therapy - Functional Problem List: deconditioned Factors Delaying/Impairing Wound Healing: Infection - systemic/local;Altered sensation Hydrotherapy Plan: Debridement;Dressing change;Patient/family education;Pulsatile lavage with suction Wound Therapy - Frequency: 6X / week Wound Therapy - Follow Up Recommendations: [place] Wound Plan: see above  Wound Therapy Goals- Improve the function of patient's integumentary system by progressing the wound(s) through the phases of wound healing (inflammation - proliferation - remodeling) by: Decrease Necrotic Tissue to: 75 Decrease Necrotic Tissue - Progress: Met Increase Granulation  Tissue to: 25 Increase  Granulation Tissue - Progress: Met Goals/treatment plan/discharge plan were made with and agreed upon by patient/family: Yes Time For Goal Achievement: 7 days Wound Therapy - Potential for Goals: Good  Goals will be updated until maximal potential achieved or discharge criteria met.  Discharge criteria: when goals achieved, discharge from hospital, MD decision/surgical intervention, no progress towards goals, refusal/missing three consecutive treatments without notification or medical reason.  GP     [name], [name] 06/05/2014, 11:12 AM

## 2014-06-05 NOTE — Progress Notes (Signed)
Physical Therapy Treatment Patient Details Name: [name] MRN: [id number] DOB: 01-20-1936 Today's Date: 06/05/2014    History of Present Illness 78 yo male presented to the emergency department from [place] facility for shortness of breath after only being there for 1 day. PMHx: CHF with an EF of 20-25%, pacemaker/I CD placement, diabetes mellitus, COPD,     PT Comments    Progressing steadily, still fatigues easily.  Gait still mildly unsteady.   Follow Up Recommendations  Home health PT;Other (comment)     Equipment Recommendations  None recommended by PT    Recommendations for Other Services       Precautions / Restrictions Precautions Precautions: Fall Restrictions Weight Bearing Restrictions: No    Mobility  Bed Mobility Overal bed mobility: Needs Assistance Bed Mobility: Sidelying to Sit;Rolling Rolling: Supervision Sidelying to sit: Supervision Supine to sit: Supervision     General bed mobility comments: pt up and down to get breathing under control  Transfers Overall transfer level: Needs assistance Equipment used: Rolling walker (2 wheeled) Transfers: Sit to/from Stand Sit to Stand: Supervision Stand pivot transfers: Supervision       General transfer comment: cues for hand placement /safety  Ambulation/Gait Ambulation/Gait assistance: Min guard Ambulation Distance (Feet): 75 Feet (then additional 35 feet after 2 minute rest--sats 97% on 3L) Assistive device: Rolling walker (2 wheeled) Gait Pattern/deviations: Step-through pattern Gait velocity: Decreased   General Gait Details: improved gait, cues to maintain pursed lip breathing and postural checks.   Stairs            Wheelchair Mobility    Modified Rankin (Stroke Patients Only)       Balance Overall balance assessment: Needs assistance Sitting-balance support: No upper extremity supported Sitting balance-Leahy Scale: Good       Standing balance-Leahy  Scale: Fair                      Cognition Arousal/Alertness: Awake/alert Behavior During Therapy: WFL for tasks assessed/performed Overall Cognitive Status: Within Functional Limits for tasks assessed                      Exercises      General Comments        Pertinent Vitals/Pain Pain Assessment: No/denies pain    Home Living                      Prior Function            PT Goals (current goals can now be found in the care plan section) Acute Rehab PT Goals Patient Stated Goal: home sooner than later PT Goal Formulation: With patient/family Time For Goal Achievement: 06/19/14 Potential to Achieve Goals: Good Progress towards PT goals: Progressing toward goals    Frequency  Min 3X/week    PT Plan Current plan remains appropriate    Co-evaluation             End of Session Equipment Utilized During Treatment: Oxygen Activity Tolerance: Patient limited by fatigue;Patient tolerated treatment well Patient left: in bed;with call bell/phone within reach;with family/visitor present     Time: 2778-2423 PT Time Calculation (min) (ACUTE ONLY): 12 min  Charges:  $Gait Training: 8-22 mins                    G Codes:      [name], [name] 06/05/2014, 11:19 AM 06/05/2014  [name], [place] [phone]  (  pager)

## 2014-06-05 NOTE — Progress Notes (Signed)
Subjective: No CP  Breaithing is a little better   Objective: Filed Vitals:   06/04/14 2218 06/05/14 0309 06/05/14 0500 06/05/14 0551  BP:    118/58  Pulse:    80  Temp:    98.3 F (36.8 C)  TempSrc:    Oral  Resp:    18  Height:      Weight:   142 lb 3.2 oz (64.501 kg)   SpO2: 97% 100%  93%   Weight change: 3 lb 4.8 oz (1.497 kg)  Intake/Output Summary (Last 24 hours) at 06/05/14 1350 Last data filed at 06/05/14 6063  Gross per 24 hour  Intake    480 ml  Output   2700 ml  Net  -2220 ml   Net neg 3.5 L    General: Alert, awake, oriented x3, in no acute distress Neck:  JVP is normal Heart: Regular rate and rhythm, without murmurs, rubs, gallops.  Lungs: Moving air though decreased flow   Exemities:  No edema.   Neuro: Grossly intact, nonfocal.   Lab Results: Results for orders placed or performed during the hospital encounter of 06/03/14 (from the past 24 hour(s))  Glucose, capillary     Status: Abnormal   Collection Time: 06/04/14  6:58 PM  Result Value Ref Range   Glucose-Capillary 230 (H) 70 - 99 mg/dL  Glucose, capillary     Status: Abnormal   Collection Time: 06/04/14 10:03 PM  Result Value Ref Range   Glucose-Capillary 233 (H) 70 - 99 mg/dL   Comment 1 Notify RN    Comment 2 Documented in Chart   Comprehensive metabolic panel     Status: Abnormal   Collection Time: 06/05/14  6:00 AM  Result Value Ref Range   Sodium 139 137 - 147 mEq/L   Potassium 4.7 3.7 - 5.3 mEq/L   Chloride 97 96 - 112 mEq/L   CO2 33 (H) 19 - 32 mEq/L   Glucose, Bld 229 (H) 70 - 99 mg/dL   BUN 41 (H) 6 - 23 mg/dL   Creatinine, Ser 0.95 0.50 - 1.35 mg/dL   Calcium 8.6 8.4 - 10.5 mg/dL   Total Protein 6.2 6.0 - 8.3 g/dL   Albumin 2.5 (L) 3.5 - 5.2 g/dL   AST 11 0 - 37 U/L   ALT 16 0 - 53 U/L   Alkaline Phosphatase 60 39 - 117 U/L   Total Bilirubin 0.4 0.3 - 1.2 mg/dL   GFR calc non Af Amer 79 (L) >90 mL/min   GFR calc Af Amer >90 >90 mL/min   Anion gap 9 5 - 15  CBC      Status: Abnormal   Collection Time: 06/05/14  6:00 AM  Result Value Ref Range   WBC 14.0 (H) 4.0 - 10.5 K/uL   RBC 3.02 (L) 4.22 - 5.81 MIL/uL   Hemoglobin 8.9 (L) 13.0 - 17.0 g/dL   HCT 28.4 (L) 39.0 - 52.0 %   MCV 94.0 78.0 - 100.0 fL   MCH 29.5 26.0 - 34.0 pg   MCHC 31.3 30.0 - 36.0 g/dL   RDW 14.2 11.5 - 15.5 %   Platelets 214 150 - 400 K/uL  Glucose, capillary     Status: Abnormal   Collection Time: 06/05/14  7:59 AM  Result Value Ref Range   Glucose-Capillary 219 (H) 70 - 99 mg/dL  Glucose, capillary     Status: Abnormal   Collection Time: 06/05/14 12:20 PM  Result Value Ref Range  Glucose-Capillary 271 (H) 70 - 99 mg/dL    Studies/Results: No results found.  Medications: Reviewed     @PROBHOSP @  1  Acute on chronic systolic CHF  Volume is improving.  Would keep on current regimen for now.  Reassess in AM  Hold lasix after later today before am dose    2.  COPD  Followed by IM  3.  Chronic afib  Rate control  No anticoagulaton.    LOS: 2 days   Dorris Carnes 06/05/2014, 1:50 PM

## 2014-06-06 DIAGNOSIS — J9620 Acute and chronic respiratory failure, unspecified whether with hypoxia or hypercapnia: Secondary | ICD-10-CM

## 2014-06-06 LAB — CBC
HEMATOCRIT: 27.2 % — AB (ref 39.0–52.0)
Hemoglobin: 8.8 g/dL — ABNORMAL LOW (ref 13.0–17.0)
MCH: 30.9 pg (ref 26.0–34.0)
MCHC: 32.4 g/dL (ref 30.0–36.0)
MCV: 95.4 fL (ref 78.0–100.0)
PLATELETS: 215 10*3/uL (ref 150–400)
RBC: 2.85 MIL/uL — AB (ref 4.22–5.81)
RDW: 14.2 % (ref 11.5–15.5)
WBC: 14.6 10*3/uL — AB (ref 4.0–10.5)

## 2014-06-06 LAB — GLUCOSE, CAPILLARY
GLUCOSE-CAPILLARY: 270 mg/dL — AB (ref 70–99)
GLUCOSE-CAPILLARY: 319 mg/dL — AB (ref 70–99)
Glucose-Capillary: 325 mg/dL — ABNORMAL HIGH (ref 70–99)
Glucose-Capillary: 336 mg/dL — ABNORMAL HIGH (ref 70–99)

## 2014-06-06 LAB — BASIC METABOLIC PANEL
Anion gap: 9 (ref 5–15)
BUN: 42 mg/dL — ABNORMAL HIGH (ref 6–23)
CHLORIDE: 96 meq/L (ref 96–112)
CO2: 31 mEq/L (ref 19–32)
Calcium: 8.7 mg/dL (ref 8.4–10.5)
Creatinine, Ser: 0.88 mg/dL (ref 0.50–1.35)
GFR calc Af Amer: >90 mL/min (ref >90)
GFR calc non Af Amer: 81 mL/min — ABNORMAL LOW (ref >90)
GLUCOSE: 290 mg/dL — AB (ref 70–99)
POTASSIUM: 4.9 meq/L (ref 3.7–5.3)
Sodium: 136 mEq/L — ABNORMAL LOW (ref 137–147)

## 2014-06-06 MED ORDER — PREDNISONE 20 MG PO TABS
40.0000 mg | ORAL_TABLET | Freq: Every day | ORAL | Status: DC
  Administered 2014-06-06 – 2014-06-09 (×4): 40 mg via ORAL
  Filled 2014-06-06 (×5): qty 2

## 2014-06-06 MED ORDER — IPRATROPIUM BROMIDE 0.02 % IN SOLN
RESPIRATORY_TRACT | Status: AC
  Filled 2014-06-06: qty 2.5

## 2014-06-06 MED ORDER — IRBESARTAN 150 MG PO TABS
150.0000 mg | ORAL_TABLET | Freq: Every day | ORAL | Status: DC
  Administered 2014-06-07 – 2014-06-09 (×3): 150 mg via ORAL
  Filled 2014-06-06 (×3): qty 1

## 2014-06-06 NOTE — Plan of Care (Signed)
Problem: ICU Phase Progression Outcomes Goal: Pain controlled with appropriate interventions Outcome: Completed/Met Date Met:  06/06/14

## 2014-06-06 NOTE — Progress Notes (Signed)
PROGRESS NOTE  [name] PJA:[id number] DOB: 1935/08/12 DOA: 06/03/2014 PCP: Dorian Heckle, MD  HPI: 78 y.o. male with a history of CHF with an EF of 20-25%, pacemaker/I CD placement, diabetes mellitus, COPD, presented to the emergency department from [place] facility for shortness of breath.  Subjective/ 24 H Interval events - feeling better every day, wants to "fight this"  Assessment/Plan: Principal Problem:   Acute respiratory failure Active Problems:   Dyslipidemia   Essential hypertension   Cardiomyopathy, ischemic- EF 20-25% 2D 08/08/13   Persistent atrial fibrillation   Decubitus ulcer of coccygeal region-excision and closure Nov 2015   General weakness   Weakness generalized   Protein-calorie malnutrition, severe   Acute on chronic respiratory failure   COPD exacerbation   COPD (chronic obstructive pulmonary disease)  Acute on chronic respiratory failure -this is likely multifactorial from acute on chronic systolic heart failure as well as HCAP and possible A on C COPD.   -Uses 2.5 L of oxygen at home;will continue supplemental oxygen to maintain his saturations above 90%  Acute on chronic systolic CHF exacerbation - Echocardiogram: February 2015: EF 20-25% - Patient with evidence of fluid overload with lower extremity edema, Left poster expiratory wheeze. His BNP was elevated to 4000 higher than one week ago at 1500. - Cardiology consult, appreciate input - Continue Lasix transition to po tomorrow - Strict I's and O's, daily weights.  HCAP Had been treating with Levaquin for Acute on chronic COPD exacerbation.  Escalated to Vanc and Cefepime on 12/4 as patient is not improving and continues to cough up green sputum.  Patient hospitalized for 5 days in November. Receiving steroids, flutter valve, nebulizers. - now improving, will de-escalate in 24-48 hours  COPD exacerbation -Treatment and plan as above - change steroids to po today  Sacral  wound - Appreciate PT and Wound Care consulatations - PT performing hydrotherapy.  They report the patient would benefit from several more hydrotherapy sessions. - Outpatient wound center appointment scheduled 12/11 at 12:45. - Wound vac placed. 12/4.  Scheduled to be changed on Monday.  Generalized weakness/deconditioning - Patient refuses SNF.  Will need Home health services. - PT and OT consulted  Severe protein calorie malnutrition - Nutrition consulted - Continue nutritional supplementation - Will start Megace  Essential hypertension - Stable, continue Lasix, ARB, amlodipine  Dyslipidemia - Continue statin  Diabetes mellitus, type II - Diet controlled - Will place on ISS with CBG monitoring   Diet: heart healthy Fluids: none DVT Prophylaxis: Lovenox  Code Status: DNR Family Communication:  Disposition Plan: inpatient, home when ready   Consultants:  Cardiology   Procedures:  None    Antibiotics Antibiotics Given (last 72 hours)    Date/Time Action Medication Dose Rate   06/03/14 1842 Given   levofloxacin (LEVAQUIN) IVPB 500 mg 500 mg 100 mL/hr   06/05/14 1600 Given   vancomycin (VANCOCIN) 500 mg in sodium chloride 0.9 % 100 mL IVPB 500 mg 100 mL/hr   06/05/14 1700 Given   ceFEPIme (MAXIPIME) 1 g in dextrose 5 % 50 mL IVPB 1 g 100 mL/hr   06/05/14 2319 Given   vancomycin (VANCOCIN) 500 mg in sodium chloride 0.9 % 100 mL IVPB 500 mg 100 mL/hr   06/06/14 0130 Given   ceFEPIme (MAXIPIME) 1 g in dextrose 5 % 50 mL IVPB 1 g 100 mL/hr   06/06/14 1129 Given   vancomycin (VANCOCIN) 500 mg in sodium chloride 0.9 % 100 mL IVPB 500 mg  100 mL/hr   06/06/14 1254 Given   ceFEPIme (MAXIPIME) 1 g in dextrose 5 % 50 mL IVPB 1 g 100 mL/hr      Studies  No results found. Objective  Filed Vitals:   06/05/14 2059 06/05/14 2248 06/06/14 0551 06/06/14 1129  BP:  142/52 139/54   Pulse:  80 73 82  Temp:  98.1 F (36.7 C) 97.9 F (36.6 C)   TempSrc:  Oral Oral     Resp:  22 18   Height:      Weight:   63.05 kg (139 lb)   SpO2: 93% 100% 100%     Intake/Output Summary (Last 24 hours) at 06/06/14 1309 Last data filed at 06/06/14 0900  Gross per 24 hour  Intake    600 ml  Output   1205 ml  Net   -605 ml   Filed Weights   06/04/14 0607 06/05/14 0500 06/06/14 0551  Weight: 64.184 kg (141 lb 8 oz) 64.501 kg (142 lb 3.2 oz) 63.05 kg (139 lb)    Exam:  General:  NAD, tachypneic but comfortable, lying in bed.  Cardiovascular: RRR,  And 2+ pitting LE edema  Respiratory: decreased breath sounds anteriorly, expiratory wheeze on posterior left.  Abdomen: soft, non tender, thin, no masses  MSK: 2+ LE pitting edema  Neuro: non focal  Skin:  Sacral wound with wound vac.   Data Reviewed: Basic Metabolic Panel:  Recent Labs Lab 06/01/14 0344 06/03/14 1330 06/04/14 0513 06/05/14 0600 06/06/14 0617  NA 134* 136* 137 139 136*  K 4.2 4.9 4.5 4.7 4.9  CL 92* 95* 95* 97 96  CO2 29 31 32 33* 31  GLUCOSE 233* 212* 234* 229* 290*  BUN 39* 36* 36* 41* 42*  CREATININE 0.89 0.86 0.86 0.95 0.88  CALCIUM 8.6 8.8 8.6 8.6 8.7  MG  --   --  2.5  --   --   PHOS  --   --  3.3  --   --    Liver Function Tests:  Recent Labs Lab 06/03/14 1330 06/05/14 0600  AST 17 11  ALT 23 16  ALKPHOS 65 60  BILITOT 0.6 0.4  PROT 6.6 6.2  ALBUMIN 2.7* 2.5*   CBC:  Recent Labs Lab 06/01/14 0344 06/03/14 1330 06/04/14 0513 06/05/14 0600 06/06/14 0617  WBC 10.5 12.6* 11.5* 14.0* 14.6*  NEUTROABS  --  11.7*  --   --   --   HGB 8.5* 9.8* 8.9* 8.9* 8.8*  HCT 26.7* 29.4* 28.2* 28.4* 27.2*  MCV 91.8 93.6 93.4 94.0 95.4  PLT 211 165 194 214 215   Cardiac Enzymes:  Recent Labs Lab 06/03/14 1330  TROPONINI <0.30   BNP (last 3 results)  Recent Labs  05/27/14 1430 06/03/14 1328  PROBNP 1578.0* 4268.0*   CBG:  Recent Labs Lab 06/05/14 1220 06/05/14 1640 06/05/14 2226 06/06/14 0759 06/06/14 1210  GLUCAP 271* 409* 335* 325* 270*     Recent Results (from the past 240 hour(s))  Blood Culture (routine x 2)     Status: None   Collection Time: 05/27/14  2:30 PM  Result Value Ref Range Status   Specimen Description BLOOD RIGHT ARM  Final   Special Requests BOTTLES DRAWN AEROBIC AND ANAEROBIC 5CC  Final   Culture  Setup Time   Final    05/27/2014 21:42 Performed at [place]   Final    NO GROWTH 5 DAYS Performed at [facility]  Report Status 06/02/2014 FINAL  Final  Culture, blood (routine x 2)     Status: None   Collection Time: 05/27/14  3:17 PM  Result Value Ref Range Status   Specimen Description BLOOD RIGHT HAND  Final   Special Requests BOTTLES DRAWN AEROBIC AND ANAEROBIC 10CC  Final   Culture  Setup Time   Final    05/28/2014 04:48 Performed at [place]   Final    NO GROWTH 5 DAYS Performed at [facility]    Report Status 06/03/2014 FINAL  Final  Urine culture     Status: None   Collection Time: 05/27/14  3:35 PM  Result Value Ref Range Status   Specimen Description URINE, CLEAN CATCH  Final   Special Requests NONE  Final   Culture  Setup Time   Final    05/28/2014 05:41 Performed at [place]   Final    2,000 COLONIES/ML Performed at [facility]    Culture   Final    INSIGNIFICANT GROWTH Performed at [facility]    Report Status 05/29/2014 FINAL  Final  Urine culture     Status: None   Collection Time: 05/30/14  1:40 PM  Result Value Ref Range Status   Specimen Description URINE, CATHETERIZED  Final   Special Requests NONE  Final   Culture  Setup Time   Final    05/30/2014 14:10 Performed at [place] Performed at [facility]   Final   Culture NO GROWTH Performed at [facility]   Final   Report Status 05/31/2014 FINAL  Final     Scheduled Meds: . antiseptic oral rinse  7 mL Mouth Rinse BID  . aspirin EC  325  mg Oral QHS  . atorvastatin  10 mg Oral q1800  . budesonide  0.5 mg Nebulization BID  . ceFEPime (MAXIPIME) IV  1 g Intravenous Q12H  . collagenase   Topical Daily  . digoxin  0.125 mg Oral q morning - 10a  . diltiazem  180 mg Oral Daily  . enoxaparin (LOVENOX) injection  40 mg Subcutaneous Q24H  . feeding supplement (ENSURE COMPLETE)  237 mL Oral BID BM  . feeding supplement (GLUCERNA SHAKE)  237 mL Oral BID BM  . ferrous sulfate  325 mg Oral QHS  . guaiFENesin  1,200 mg Oral BID  . insulin aspart  0-9 Units Subcutaneous TID WC  . insulin glargine  10 Units Subcutaneous Daily  . ipratropium      . ipratropium  0.5 mg Nebulization TID  . [START ON 06/07/2014] irbesartan  150 mg Oral Daily  . levalbuterol  1.25 mg Nebulization TID  . megestrol  20 mg Oral BID  . nitroGLYCERIN  0.2 mg Transdermal Q24H  . potassium chloride SA  20 mEq Oral Daily  . predniSONE  40 mg Oral Q breakfast  . sodium chloride  3 mL Intravenous Q12H  . vancomycin  500 mg Intravenous Q12H   Continuous Infusions: . sodium chloride      Time spent: 25 minutes  Costin M. Cruzita Lederer, MD [facility] [phone] If 7 PM - 7 AM, please contact night-coverage at [email or web address], password [facility] 06/06/2014, 1:09 PM  LOS: 3 days

## 2014-06-06 NOTE — Progress Notes (Signed)
    Subjective:  Denies CP; dyspnea improving   Objective:  Filed Vitals:   06/05/14 0551 06/05/14 2059 06/05/14 2248 06/06/14 0551  BP: 118/58  142/52 139/54  Pulse: 80  80 73  Temp: 98.3 F (36.8 C)  98.1 F (36.7 C) 97.9 F (36.6 C)  TempSrc: Oral  Oral Oral  Resp: 18  22 18   Height:      Weight:    139 lb (63.05 kg)  SpO2: 93% 93% 100% 100%    Intake/Output from previous day:  Intake/Output Summary (Last 24 hours) at 06/06/14 1100 Last data filed at 06/06/14 0539  Gross per 24 hour  Intake    480 ml  Output   1205 ml  Net   -725 ml    Physical Exam: Physical exam: Well-developed frail, chronically ill appearing in no acute distress.  Skin is warm and dry.  HEENT is normal.  Neck is supple.  Chest with diminished BS throughout Cardiovascular exam is regular rate and rhythm.  Abdominal exam nontender or distended. No masses palpated. Extremities show no edema. neuro grossly intact    Lab Results: Basic Metabolic Panel:  Recent Labs  06/04/14 0513 06/05/14 0600 06/06/14 0617  NA 137 139 136*  K 4.5 4.7 4.9  CL 95* 97 96  CO2 32 33* 31  GLUCOSE 234* 229* 290*  BUN 36* 41* 42*  CREATININE 0.86 0.95 0.88  CALCIUM 8.6 8.6 8.7  MG 2.5  --   --   PHOS 3.3  --   --    CBC:  Recent Labs  06/03/14 1330  06/05/14 0600 06/06/14 0617  WBC 12.6*  < > 14.0* 14.6*  NEUTROABS 11.7*  --   --   --   HGB 9.8*  < > 8.9* 8.8*  HCT 29.4*  < > 28.4* 27.2*  MCV 93.6  < > 94.0 95.4  PLT 165  < > 214 215  < > = values in this interval not displayed. Cardiac Enzymes:  Recent Labs  06/03/14 1330  TROPONINI <0.30     Assessment/Plan:   1. Acute on chronic respiratory insufficiency - likely combination of COPD and CHF. Not volume overloaded on exam. - continue IV lasix today and convert to po in AM. Would resume his 80mg  in AM and 40mg  in PM PO lasix at home  2. Acute on chronic systolic heart failure/ Ischemic cardiomyopathy,  status post bi-V ICD - baseline EF 20-25% on Echo 08/08/2013 - continue avapro but change to 150 mg daily; DC norvasc; not on beta blocker due to active wheezing and COPD.  3. Coronary artery disease s/p 3 v CABG 1998 - last cath 2006, severe native dx, patent LIMA and SVGs  - continue ASA and statin 4. Chronic a-fib: not on systemic anticoagulation due to bleeding history; coreg changed to cardizem due to severity of COPD 5. significant peripheral vascular disease 6. DM 7. severe COPD on 24/7 2.5L home oxygen  [name] 06/06/2014, 11:00 AM

## 2014-06-06 NOTE — Telephone Encounter (Signed)
Rx was sent to pharmacy electronically. 

## 2014-06-06 NOTE — Plan of Care (Signed)
Problem: Consults Goal: Haematologist (COPD) A. Perform depression and anxiety screening and results provided to attending. B. Assess patient for other needs that can be addressed by social worker.  Outcome: Completed/Met Date Met:  06/06/14 Goal: Physical Therapy Consult Outcome: Completed/Met Date Met:  06/06/14 Goal: Occupational Therapy Consult Outcome: Completed/Met Date Met:  06/06/14  Problem: ICU Phase Progression Outcomes Goal: O2 sats trending toward baseline Outcome: Completed/Met Date Met:  06/06/14 Goal: Dyspnea controlled at rest Outcome: Completed/Met Date Met:  06/06/14

## 2014-06-07 LAB — BASIC METABOLIC PANEL
ANION GAP: 9 (ref 5–15)
BUN: 36 mg/dL — ABNORMAL HIGH (ref 6–23)
CALCIUM: 8.6 mg/dL (ref 8.4–10.5)
CHLORIDE: 98 meq/L (ref 96–112)
CO2: 29 mEq/L (ref 19–32)
Creatinine, Ser: 0.76 mg/dL (ref 0.50–1.35)
GFR calc Af Amer: >90 mL/min (ref >90)
GFR calc non Af Amer: 86 mL/min — ABNORMAL LOW (ref >90)
Glucose, Bld: 209 mg/dL — ABNORMAL HIGH (ref 70–99)
Potassium: 5.2 mEq/L (ref 3.7–5.3)
SODIUM: 136 meq/L — AB (ref 137–147)

## 2014-06-07 LAB — GLUCOSE, CAPILLARY
GLUCOSE-CAPILLARY: 288 mg/dL — AB (ref 70–99)
Glucose-Capillary: 192 mg/dL — ABNORMAL HIGH (ref 70–99)
Glucose-Capillary: 289 mg/dL — ABNORMAL HIGH (ref 70–99)
Glucose-Capillary: 378 mg/dL — ABNORMAL HIGH (ref 70–99)

## 2014-06-07 MED ORDER — FUROSEMIDE 10 MG/ML IJ SOLN: 40.0000 mg | Freq: Every day | INTRAMUSCULAR | Status: DC

## 2014-06-07 MED ORDER — FUROSEMIDE 10 MG/ML IJ SOLN: 80.0000 mg | Freq: Every day | INTRAMUSCULAR | Status: DC

## 2014-06-07 MED ORDER — FUROSEMIDE 80 MG PO TABS
80.0000 mg | ORAL_TABLET | Freq: Every day | ORAL | Status: DC
  Administered 2014-06-07 – 2014-06-09 (×3): 80 mg via ORAL
  Filled 2014-06-07 (×3): qty 1

## 2014-06-07 MED ORDER — FUROSEMIDE 40 MG PO TABS
40.0000 mg | ORAL_TABLET | Freq: Every day | ORAL | Status: DC
  Administered 2014-06-07 – 2014-06-08 (×2): 40 mg via ORAL
  Filled 2014-06-07 (×3): qty 1

## 2014-06-07 MED ORDER — VANCOMYCIN HCL IN DEXTROSE 750-5 MG/150ML-% IV SOLN
750.0000 mg | Freq: Two times a day (BID) | INTRAVENOUS | Status: DC
  Administered 2014-06-07 (×2): 750 mg via INTRAVENOUS
  Filled 2014-06-07 (×3): qty 150

## 2014-06-07 NOTE — Plan of Care (Signed)
Problem: Consults Goal: Skin Care Protocol Initiated - if Braden Score 18 or less If consults are not indicated, leave blank or document N/A  Outcome: Completed/Met Date Met:  06/07/14 Goal: Diabetes Guidelines if Diabetic/Glucose > 140 If diabetic or lab glucose is > 140 mg/dl - Initiate Diabetes/Hyperglycemia Guidelines & Document Interventions  Outcome: Completed/Met Date Met:  06/07/14 Goal: Case Management Consult (COPD) A. Determine the need for home health or [facility] care management. B. Determine PCP and / or establish PCP for patient if no PCP. C. Determine need and eligibility for Pulmonary Rehab and obtain referral. D. Coordinate home health services and PT/OT recommendations prior patient discharge.  Outcome: Completed/Met Date Met:  06/07/14 Goal: Respiratory Therapy Consult Outcome: Completed/Met Date Met:  06/07/14 Goal: Dietician Consult Outcome: Completed/Met Date Met:  06/07/14  Problem: ICU Phase Progression Outcomes Goal: Hemodynamically stable Outcome: Completed/Met Date Met:  06/07/14 Goal: Other ICU Phase Outcomes/Goals Outcome: Not Applicable Date Met:  76/18/48  Problem: Phase I Progression Outcomes Goal: Dyspnea controlled at rest Outcome: Completed/Met Date Met:  06/07/14 Goal: Pain controlled Outcome: Completed/Met Date Met:  06/07/14 Goal: Tolerating diet Outcome: Completed/Met Date Met:  06/07/14

## 2014-06-07 NOTE — Progress Notes (Addendum)
PROGRESS NOTE  [name] RCV:[id number] DOB: 1935/11/29 DOA: 06/03/2014 PCP: Dorian Heckle, MD  HPI: 78 y.o. male with a history of CHF with an EF of 20-25%, pacemaker/I CD placement, diabetes mellitus, COPD, presented to the emergency department from [place] facility for shortness of breath.  Subjective/ 24 H Interval events - feeling well, states he is stronger  Assessment/Plan: Principal Problem:   Acute respiratory failure Active Problems:   Dyslipidemia   Essential hypertension   Cardiomyopathy, ischemic- EF 20-25% 2D 08/08/13   Persistent atrial fibrillation   Decubitus ulcer of coccygeal region-excision and closure Nov 2015   General weakness   Weakness generalized   Protein-calorie malnutrition, severe   Acute on chronic respiratory failure   COPD exacerbation   COPD (chronic obstructive pulmonary disease)  Acute on chronic respiratory failure -this is likely multifactorial from acute on chronic systolic heart failure as well as HCAP and possible A on C COPD.   -Uses 2.5 L of oxygen at home;will continue supplemental oxygen to maintain his saturations above 90%  Acute on chronic systolic CHF exacerbation - Echocardiogram: February 2015: EF 20-25% - Patient with evidence of fluid overload with lower extremity edema, Left poster expiratory wheeze. His BNP was elevated to 4000 higher than one week ago at 1500. - Cardiology consult, appreciate input - Continue Lasix transition to po today 80 am and 40 pm - Strict I's and O's, daily weights.  HCAP Had been treating with Levaquin for Acute on chronic COPD exacerbation.  Escalated to Vanc and Cefepime on 12/4 as patient is not improving and continues to cough up green sputum.  Patient hospitalized for 5 days in November. Receiving steroids, flutter valve, nebulizers. - now improving, will de-escalate on Monday  COPD exacerbation -Treatment and plan as above - change steroids to po today  Sacral  wound - Appreciate PT and Wound Care consulatations - PT performing hydrotherapy.  They report the patient would benefit from several more hydrotherapy sessions. - Outpatient wound center appointment scheduled 12/11 at 12:45. - Wound vac placed. 12/4.  Scheduled to be changed on Monday.  Generalized weakness/deconditioning - Patient refuses SNF.  Will need Home health services. - PT and OT consulted  Severe protein calorie malnutrition - Nutrition consulted - Continue nutritional supplementation - Will start Megace  Essential hypertension - Stable, continue Lasix, ARB, amlodipine  Dyslipidemia - Continue statin  Diabetes mellitus, type II - Diet controlled - Will place on ISS with CBG monitoring   Diet: heart healthy Fluids: none DVT Prophylaxis: Lovenox  Code Status: DNR Family Communication:  Disposition Plan: inpatient, home when ready   Consultants:  Cardiology   Procedures:  None    Antibiotics Antibiotics Given (last 72 hours)    Date/Time Action Medication Dose Rate   06/05/14 1600 Given   vancomycin (VANCOCIN) 500 mg in sodium chloride 0.9 % 100 mL IVPB 500 mg 100 mL/hr   06/05/14 1700 Given   ceFEPIme (MAXIPIME) 1 g in dextrose 5 % 50 mL IVPB 1 g 100 mL/hr   06/05/14 2319 Given   vancomycin (VANCOCIN) 500 mg in sodium chloride 0.9 % 100 mL IVPB 500 mg 100 mL/hr   06/06/14 0130 Given   ceFEPIme (MAXIPIME) 1 g in dextrose 5 % 50 mL IVPB 1 g 100 mL/hr   06/06/14 1129 Given   vancomycin (VANCOCIN) 500 mg in sodium chloride 0.9 % 100 mL IVPB 500 mg 100 mL/hr   06/06/14 1254 Given   ceFEPIme (MAXIPIME) 1 g in  dextrose 5 % 50 mL IVPB 1 g 100 mL/hr   06/06/14 2301 Given   vancomycin (VANCOCIN) 500 mg in sodium chloride 0.9 % 100 mL IVPB 500 mg 100 mL/hr   06/07/14 0113 Given   ceFEPIme (MAXIPIME) 1 g in dextrose 5 % 50 mL IVPB 1 g 100 mL/hr      Studies  No results found. Objective  Filed Vitals:   06/06/14 2148 06/07/14 0500 06/07/14 0547  06/07/14 0800  BP: 124/56  114/38   Pulse: 75  74   Temp: 97.9 F (36.6 C)  98.5 F (36.9 C)   TempSrc: Oral  Oral   Resp: 18  17   Height:      Weight:  64.048 kg (141 lb 3.2 oz)    SpO2: 94%  100% 98%    Intake/Output Summary (Last 24 hours) at 06/07/14 0901 Last data filed at 06/07/14 0350  Gross per 24 hour  Intake      0 ml  Output   1075 ml  Net  -1075 ml   Filed Weights   06/05/14 0500 06/06/14 0551 06/07/14 0500  Weight: 64.501 kg (142 lb 3.2 oz) 63.05 kg (139 lb) 64.048 kg (141 lb 3.2 oz)    Exam:  General:  NAD, tachypneic but comfortable, lying in bed.  Cardiovascular: RRR,  And 2+ pitting LE edema  Respiratory: decreased breath sounds anteriorly, expiratory wheeze on posterior left.  Abdomen: soft, non tender, thin, no masses  MSK: 2+ LE pitting edema  Neuro: non focal  Skin:  Sacral wound with wound vac.   Data Reviewed: Basic Metabolic Panel:  Recent Labs Lab 06/01/14 0344 06/03/14 1330 06/04/14 0513 06/05/14 0600 06/06/14 0617  NA 134* 136* 137 139 136*  K 4.2 4.9 4.5 4.7 4.9  CL 92* 95* 95* 97 96  CO2 29 31 32 33* 31  GLUCOSE 233* 212* 234* 229* 290*  BUN 39* 36* 36* 41* 42*  CREATININE 0.89 0.86 0.86 0.95 0.88  CALCIUM 8.6 8.8 8.6 8.6 8.7  MG  --   --  2.5  --   --   PHOS  --   --  3.3  --   --    Liver Function Tests:  Recent Labs Lab 06/03/14 1330 06/05/14 0600  AST 17 11  ALT 23 16  ALKPHOS 65 60  BILITOT 0.6 0.4  PROT 6.6 6.2  ALBUMIN 2.7* 2.5*   CBC:  Recent Labs Lab 06/01/14 0344 06/03/14 1330 06/04/14 0513 06/05/14 0600 06/06/14 0617  WBC 10.5 12.6* 11.5* 14.0* 14.6*  NEUTROABS  --  11.7*  --   --   --   HGB 8.5* 9.8* 8.9* 8.9* 8.8*  HCT 26.7* 29.4* 28.2* 28.4* 27.2*  MCV 91.8 93.6 93.4 94.0 95.4  PLT 211 165 194 214 215   Cardiac Enzymes:  Recent Labs Lab 06/03/14 1330  TROPONINI <0.30   BNP (last 3 results)  Recent Labs  05/27/14 1430 06/03/14 1328  PROBNP 1578.0* 4268.0*    CBG:  Recent Labs Lab 06/06/14 0759 06/06/14 1210 06/06/14 1719 06/06/14 2119 06/07/14 0806  GLUCAP 325* 270* 319* 336* 192*    Recent Results (from the past 240 hour(s))  Urine culture     Status: None   Collection Time: 05/30/14  1:40 PM  Result Value Ref Range Status   Specimen Description URINE, CATHETERIZED  Final   Special Requests NONE  Final   Culture  Setup Time   Final    05/30/2014 14:10  Performed at [place] Performed at [facility]   Final   Culture NO GROWTH Performed at [facility]   Final   Report Status 05/31/2014 FINAL  Final     Scheduled Meds: . antiseptic oral rinse  7 mL Mouth Rinse BID  . aspirin EC  325 mg Oral QHS  . atorvastatin  10 mg Oral q1800  . budesonide  0.5 mg Nebulization BID  . ceFEPime (MAXIPIME) IV  1 g Intravenous Q12H  . collagenase   Topical Daily  . digoxin  0.125 mg Oral q morning - 10a  . diltiazem  180 mg Oral Daily  . enoxaparin (LOVENOX) injection  40 mg Subcutaneous Q24H  . feeding supplement (ENSURE COMPLETE)  237 mL Oral BID BM  . feeding supplement (GLUCERNA SHAKE)  237 mL Oral BID BM  . ferrous sulfate  325 mg Oral QHS  . guaiFENesin  1,200 mg Oral BID  . insulin aspart  0-9 Units Subcutaneous TID WC  . insulin glargine  10 Units Subcutaneous Daily  . ipratropium  0.5 mg Nebulization TID  . irbesartan  150 mg Oral Daily  . levalbuterol  1.25 mg Nebulization TID  . megestrol  20 mg Oral BID  . nitroGLYCERIN  0.2 mg Transdermal Q24H  . potassium chloride SA  20 mEq Oral Daily  . predniSONE  40 mg Oral Q breakfast  . sodium chloride  3 mL Intravenous Q12H  . vancomycin  500 mg Intravenous Q12H   Continuous Infusions: . sodium chloride      Time spent: 15 minutes  Niveah Boerner M. Cruzita Lederer, MD [facility] [phone] If 7 PM - 7 AM, please contact night-coverage at [email or web address], password [facility] 06/07/2014, 9:01 AM  LOS: 4 days

## 2014-06-07 NOTE — Progress Notes (Signed)
    Subjective:  Denies CP; dyspnea improving   Objective:  Filed Vitals:   06/07/14 0500 06/07/14 0547 06/07/14 0800 06/07/14 1041  BP:  114/38  111/37  Pulse:  74  80  Temp:  98.5 F (36.9 C)    TempSrc:  Oral    Resp:  17    Height:      Weight: 141 lb 3.2 oz (64.048 kg)     SpO2:  100% 98%     Intake/Output from previous day:  Intake/Output Summary (Last 24 hours) at 06/07/14 1210 Last data filed at 06/07/14 1046  Gross per 24 hour  Intake    240 ml  Output   1075 ml  Net   -835 ml    Physical Exam: Physical exam: Well-developed frail, chronically ill appearing in no acute distress.  Skin is warm and dry.  HEENT is normal.  Neck is supple.  Chest with diminished BS throughout Cardiovascular exam is regular rate and rhythm.  Abdominal exam nontender or distended. No masses palpated. Extremities show no edema. neuro grossly intact    Lab Results: Basic Metabolic Panel:  Recent Labs  06/06/14 0617 06/07/14 0644  NA 136* 136*  K 4.9 5.2  CL 96 98  CO2 31 29  GLUCOSE 290* 209*  BUN 42* 36*  CREATININE 0.88 0.76  CALCIUM 8.7 8.6   CBC:  Recent Labs  06/05/14 0600 06/06/14 0617  WBC 14.0* 14.6*  HGB 8.9* 8.8*  HCT 28.4* 27.2*  MCV 94.0 95.4  PLT 214 215   Cardiac Enzymes: No results for input(s): CKTOTAL, CKMB, CKMBINDEX, TROPONINI in the last 72 hours.   Assessment/Plan:   1. Acute on chronic respiratory insufficiency - likely combination of COPD and CHF. Not volume overloaded on exam. - Now on PO lasix 80mg  in AM and 40mg  in PM PO lasix at home  2. Acute on chronic systolic heart failure/ Ischemic cardiomyopathy, status post bi-V ICD - baseline EF 20-25% on Echo 08/08/2013 - continue avapro but change to 150 mg daily; DC'd norvasc; not on beta blocker due to active wheezing and COPD.  - continue digoxin  3. Coronary artery disease s/p 3 v CABG 1998 - last cath 2006, severe  native dx, patent LIMA and SVGs  - continue ASA and statin  4. Chronic a-fib: not on systemic anticoagulation due to bleeding history; coreg changed to cardizem due to severity of COPD  5. significant peripheral vascular disease  6. DM  7. severe COPD on 24/7 2.5L home oxygen  SKAINS, MARK 06/07/2014, 12:10 PM

## 2014-06-07 NOTE — Progress Notes (Addendum)
ANTIBIOTIC CONSULT NOTE - FOLLOW UP  Pharmacy Consult for vancomycin, Cefepime Indication: HCAP  No Known Allergies  Patient Measurements: Height: 5\' 7"  (170.2 cm) Weight: 141 lb 3.2 oz (64.048 kg) IBW/kg (Calculated) : 66.1 Vital Signs: Temp: 98.5 F (36.9 C) (12/06 0547) Temp Source: Oral (12/06 0547) BP: 114/38 mmHg (12/06 0547) Pulse Rate: 74 (12/06 0547) Intake/Output from previous day: 12/05 0701 - 12/06 0700 In: 360 [P.O.:360] Out: 1075 [Urine:1075] Intake/Output from this shift:    Labs:  Recent Labs  06/05/14 0600 06/06/14 0617 06/07/14 0644  WBC 14.0* 14.6*  --   HGB 8.9* 8.8*  --   PLT 214 215  --   CREATININE 0.95 0.88 0.76   Estimated Creatinine Clearance: 71.1 mL/min (by C-G formula based on Cr of 0.76). No results for input(s): VANCOTROUGH, VANCOPEAK, VANCORANDOM, GENTTROUGH, GENTPEAK, GENTRANDOM, TOBRATROUGH, TOBRAPEAK, TOBRARND, AMIKACINPEAK, AMIKACINTROU, AMIKACIN in the last 72 hours.   Microbiology: Recent Results (from the past 720 hour(s))  Blood Culture (routine x 2)     Status: None   Collection Time: 05/27/14  2:30 PM  Result Value Ref Range Status   Specimen Description BLOOD RIGHT ARM  Final   Special Requests BOTTLES DRAWN AEROBIC AND ANAEROBIC 5CC  Final   Culture  Setup Time   Final    05/27/2014 21:42 Performed at [place]   Final    NO GROWTH 5 DAYS Performed at [facility]    Report Status 06/02/2014 FINAL  Final  Culture, blood (routine x 2)     Status: None   Collection Time: 05/27/14  3:17 PM  Result Value Ref Range Status   Specimen Description BLOOD RIGHT HAND  Final   Special Requests BOTTLES DRAWN AEROBIC AND ANAEROBIC 10CC  Final   Culture  Setup Time   Final    05/28/2014 04:48 Performed at [place]   Final    NO GROWTH 5 DAYS Performed at [facility]    Report Status 06/03/2014 FINAL  Final  Urine culture     Status: None   Collection Time:  05/27/14  3:35 PM  Result Value Ref Range Status   Specimen Description URINE, CLEAN CATCH  Final   Special Requests NONE  Final   Culture  Setup Time   Final    05/28/2014 05:41 Performed at [place]   Final    2,000 COLONIES/ML Performed at [facility]    Culture   Final    INSIGNIFICANT GROWTH Performed at [facility]    Report Status 05/29/2014 FINAL  Final  Urine culture     Status: None   Collection Time: 05/30/14  1:40 PM  Result Value Ref Range Status   Specimen Description URINE, CATHETERIZED  Final   Special Requests NONE  Final   Culture  Setup Time   Final    05/30/2014 14:10 Performed at [place] Performed at [facility]   Final   Culture NO GROWTH Performed at [facility]   Final   Report Status 05/31/2014 FINAL  Final    Anti-infectives    Start     Dose/Rate Route Frequency Ordered Stop   06/05/14 1200  vancomycin (VANCOCIN) 500 mg in sodium chloride 0.9 % 100 mL IVPB     500 mg100 mL/hr over 60 Minutes Intravenous Every 12 hours 06/05/14 1100  06/05/14 1130  ceFEPIme (MAXIPIME) 1 g in dextrose 5 % 50 mL IVPB     1 g100 mL/hr over 30 Minutes Intravenous Every 12 hours 06/05/14 1100     06/03/14 1800  levofloxacin (LEVAQUIN) IVPB 500 mg  Status:  Discontinued     500 mg100 mL/hr over 60 Minutes Intravenous Every 24 hours 06/03/14 1710 06/05/14 1006      Assessment: 78 year old male on day #3 of vancomycin and cefepime for presumed HCAP. WBC 14.6 on 12/5 - no CBC today. Patient is currently afebrile and no cultures this admission. SCr is improving with UOP ~ 0.7 cc/kg/hr now off IV lasix.   Goal of Therapy:  Vancomycin trough level 15-20 mcg/ml  Plan:  Increase vancomycin to 750mg  IV q12h.  Continue Cefepime 1g IV q12h. Monitor renal function and clinical status.  Vancomycin trough at [facility] if needed.   Sloan Leiter, PharmD, BCPS Clinical  Pharmacist [phone] 06/07/2014,10:04 AM

## 2014-06-08 ENCOUNTER — Encounter: Payer: Self-pay | Admitting: *Deleted

## 2014-06-08 ENCOUNTER — Ambulatory Visit: Payer: Medicare Other | Admitting: Pulmonary Disease

## 2014-06-08 LAB — GLUCOSE, CAPILLARY
GLUCOSE-CAPILLARY: 187 mg/dL — AB (ref 70–99)
GLUCOSE-CAPILLARY: 249 mg/dL — AB (ref 70–99)
Glucose-Capillary: 137 mg/dL — ABNORMAL HIGH (ref 70–99)
Glucose-Capillary: 278 mg/dL — ABNORMAL HIGH (ref 70–99)

## 2014-06-08 LAB — BASIC METABOLIC PANEL
Anion gap: 7 (ref 5–15)
BUN: 33 mg/dL — AB (ref 6–23)
CHLORIDE: 95 meq/L — AB (ref 96–112)
CO2: 33 mEq/L — ABNORMAL HIGH (ref 19–32)
Calcium: 8.6 mg/dL (ref 8.4–10.5)
Creatinine, Ser: 0.82 mg/dL (ref 0.50–1.35)
GFR calc Af Amer: >90 mL/min (ref >90)
GFR calc non Af Amer: 84 mL/min — ABNORMAL LOW (ref >90)
GLUCOSE: 168 mg/dL — AB (ref 70–99)
POTASSIUM: 4.6 meq/L (ref 3.7–5.3)
Sodium: 135 mEq/L — ABNORMAL LOW (ref 137–147)

## 2014-06-08 MED ORDER — INSULIN ASPART 100 UNIT/ML ~~LOC~~ SOLN
2.0000 [IU] | Freq: Once | SUBCUTANEOUS | Status: AC
  Administered 2014-06-08: 2 [IU] via SUBCUTANEOUS

## 2014-06-08 MED ORDER — LEVOFLOXACIN 500 MG PO TABS
500.0000 mg | ORAL_TABLET | Freq: Every day | ORAL | Status: DC
  Administered 2014-06-08 – 2014-06-09 (×2): 500 mg via ORAL
  Filled 2014-06-08 (×2): qty 1

## 2014-06-08 NOTE — Consult Note (Signed)
WOC wound follow up Wound type: pressure ulcer; Stage IV  Measurement: 5.0 x 3.0cm x 2.5cm  Wound ULG:SPJS, with 50% fibrin over wound bed now Drainage (amount, consistency, odor) minimal in canister  Periwound: intact  Dressing procedure/placement/frequency: 1pc of black foam used to fill the wound bed, bridge to the right hip, skin protected for bridge.  Pt tolerated well.    WOC will follow along with you for VAC dressing changes.  Yemariam Magar [place] RN,CWOCN [phone]

## 2014-06-08 NOTE — Progress Notes (Signed)
Inpatient Diabetes Program Recommendations  AACE/ADA: New Consensus Statement on Inpatient Glycemic Control (2013)  Target Ranges:  Prepandial:   less than 140 mg/dL      Peak postprandial:   less than 180 mg/dL (1-2 hours)      Critically ill patients:  140 - 180 mg/dL   Reason for Assessment:  Results for KENTAVIOUS, MICHELE (MRN [id number]) as of 06/08/2014 13:22  Ref. Range 06/07/2014 11:59 06/07/2014 17:09 06/07/2014 22:01 06/08/2014 08:11 06/08/2014 12:09  Glucose-Capillary Latest Range: 70-99 mg/dL 289 (H) 378 (H) 288 (H) 137 (H) 187 (H)   Diabetes history: Type 2 diabetes/Diet controlled  Current orders for Inpatient glycemic control:  Lantus 10 units daily, Novolog sensitive tid with meals   Note that patient is on Prednisone 40 mg daily.  While on steroids, consider adding Novolog meal coverage 3 units tid with meals.    Thanks, Adah Perl, RN, BC-ADM Inpatient Diabetes Coordinator Pager [phone]

## 2014-06-08 NOTE — Progress Notes (Signed)
Medicare Important Message given?  YES (If response is "NO", the following Medicare IM given date fields will be blank) Date Medicare IM given:  06/08/14 Medicare IM given by:  Brayla Pat 

## 2014-06-08 NOTE — Plan of Care (Signed)
Problem: ICU Phase Progression Outcomes Goal: Initial discharge plan identified Outcome: Progressing  Problem: Phase I Progression Outcomes Goal: O2 sats > or equal 90% or at baseline Outcome: Progressing Goal: Hemodynamically stable Outcome: Progressing

## 2014-06-08 NOTE — Progress Notes (Addendum)
Patient hs CBG 278, no coverage ordered. Schorr, NP notified. Awaiting orders.  Orders placed for patient to receive 2 units of novolog.

## 2014-06-08 NOTE — Plan of Care (Signed)
Problem: Phase II Progression Outcomes Goal: Pain controlled on oral analgesia Outcome: Completed/Met Date Met:  06/08/14

## 2014-06-08 NOTE — Progress Notes (Signed)
Subjective: No chest pain, breathing improved.  Discussed DNR status, pt stated it is premature.  He does now wish to be intubated but he does wish to be shocked and BP treated.  Will adjust order.   Objective: Vital signs in last 24 hours: Temp:  [98 F (36.7 C)-98.1 F (36.7 C)] 98.1 F (36.7 C) (12/07 0643) Pulse Rate:  [76] 76 (12/06 2205) Resp:  [16-17] 16 (12/07 0643) BP: (121-136)/(54-71) 133/71 mmHg (12/07 0920) SpO2:  [97 %-100 %] 99 % (12/07 0935) Weight:  [139 lb 8 oz (63.277 kg)] 139 lb 8 oz (63.277 kg) (12/07 0500) Weight change: -1 lb 11.2 oz (-0.771 kg) Last BM Date: 06/04/14 (MD Gherghe aware) Intake/Output from previous day: -5780 since admit,  -810 yesterday on po lasix.  12/06 0701 - 12/07 0700 In: 640 [P.O.:240; IV Piggyback:400] Out: 1450 [Urine:1450] Intake/Output this shift:    PE: General:Pleasant affect, NAD Skin:Warm and dry, brisk capillary refill HEENT:normocephalic, sclera clear, mucus membranes moist Heart:S1S2irreg irreg without murmur, gallup, rub or click Lungs:clear without rales, + rhonchi,  Rare wheeze WYO:VZCH, non tender, + BS, do not palpate liver spleen or masses Ext:no lower ext edema,  2+ radial pulses Neuro:alert and oriented, MAE, follows commands, + facial symmetry   Lab Results:  Recent Labs  06/06/14 0617  WBC 14.6*  HGB 8.8*  HCT 27.2*  PLT 215   BMET  Recent Labs  06/07/14 0644 06/08/14 0540  NA 136* 135*  K 5.2 4.6  CL 98 95*  CO2 29 33*  GLUCOSE 209* 168*  BUN 36* 33*  CREATININE 0.76 0.82  CALCIUM 8.6 8.6   No results for input(s): TROPONINI in the last 72 hours.  Invalid input(s): CK, MB  Lab Results  Component Value Date   CHOL 99 07/21/2013   HDL 37.70* 07/21/2013   LDLCALC 49 07/21/2013   TRIG 64.0 07/21/2013   CHOLHDL 3 07/21/2013   Lab Results  Component Value Date   HGBA1C 8.0* 06/03/2014     Lab Results  Component Value Date   TSH 0.547 05/29/2014    Hepatic  Function Panel No results for input(s): PROT, ALBUMIN, AST, ALT, ALKPHOS, BILITOT, BILIDIR, IBILI in the last 72 hours. No results for input(s): CHOL in the last 72 hours. No results for input(s): PROTIME in the last 72 hours.     Studies/Results: No results found.  Medications: I have reviewed the patient's current medications. Scheduled Meds: . antiseptic oral rinse  7 mL Mouth Rinse BID  . aspirin EC  325 mg Oral QHS  . atorvastatin  10 mg Oral q1800  . budesonide  0.5 mg Nebulization BID  . collagenase   Topical Daily  . digoxin  0.125 mg Oral q morning - 10a  . diltiazem  180 mg Oral Daily  . enoxaparin (LOVENOX) injection  40 mg Subcutaneous Q24H  . feeding supplement (ENSURE COMPLETE)  237 mL Oral BID BM  . feeding supplement (GLUCERNA SHAKE)  237 mL Oral BID BM  . ferrous sulfate  325 mg Oral QHS  . furosemide  80 mg Oral Daily   And  . furosemide  40 mg Oral QPC supper  . guaiFENesin  1,200 mg Oral BID  . insulin aspart  0-9 Units Subcutaneous TID WC  . insulin glargine  10 Units Subcutaneous Daily  . ipratropium  0.5 mg Nebulization TID  . irbesartan  150 mg Oral Daily  . levalbuterol  1.25 mg Nebulization  TID  . levofloxacin  500 mg Oral Daily  . megestrol  20 mg Oral BID  . nitroGLYCERIN  0.2 mg Transdermal Q24H  . potassium chloride SA  20 mEq Oral Daily  . predniSONE  40 mg Oral Q breakfast  . sodium chloride  3 mL Intravenous Q12H   Continuous Infusions: . sodium chloride     PRN Meds:.sodium chloride, acetaminophen **OR** acetaminophen, ALPRAZolam, HYDROcodone-acetaminophen, levalbuterol, mupirocin ointment, nitroGLYCERIN, silver nitrate applicators, sodium chloride  Assessment/Plan: 78 y.o. male with a history of CHF with an EF of 20-25%, pacemaker/I CD placement, diabetes mellitus, COPD, presented to the emergency department from [place] facility for shortness of breath.  1. Acute on chronic respiratory insufficiency - likely  combination of COPD and CHF. Not volume overloaded on exam. - Now on PO lasix 80mg  in AM and 40mg  in PM PO lasix at home  2. Acute on chronic systolic heart failure/ Ischemic cardiomyopathy, status post bi-V ICD I&O -5780               Wt 2 pounds down. But continues to diuresis Cr stable) - baseline EF 20-25% on Echo 08/08/2013 - continue avapro but change to 150 mg daily; DC'd norvasc; not on beta blocker due to active wheezing and COPD. - continue digoxin  3. Coronary artery disease s/p 3 v CABG 1998 - last cath 2006, severe native dx, patent LIMA and SVGs - continue ASA and statin  4. Chronic a-fib: not on systemic anticoagulation due to bleeding history; coreg changed to cardizem due to severity of COPD  HR 88-118 not on tele.  On VVI mode on device  5. significant peripheral vascular disease  6. DM- per IM  7. severe COPD on 24/7 2.5L home oxygen  Pt is DNR but with BiV ICD device Dr. Gwenlyn Found and further discussed the issue.  Pt now wished to be full code.  Order changed.  Probable discharge tomorrow to home.   LOS: 5 days   Time spent with pt. :15 minutes. [facility] R  Nurse Practitioner Certified Pager [phone] or after 5pm and on weekends call [phone] 06/08/2014, 1:05 PM   Agree with note written by Cecilie Kicks RNP  Diuresing. Feeling better. Has ISCM, Bi-VICD. Wants to be a full code (therefore no DNR)  Lorretta Harp 06/08/2014 1:43 PM

## 2014-06-08 NOTE — Progress Notes (Signed)
PROGRESS NOTE  [name] CHE:[id number] DOB: 02-03-1936 DOA: 06/03/2014 PCP: Dorian Heckle, MD  HPI: 78 y.o. male with a history of CHF with an EF of 20-25%, pacemaker/I CD placement, diabetes mellitus, COPD, presented to the emergency department from [facility] [place] facility for shortness of breath.  Subjective/ 24 H Interval events - feeling good today  Assessment/Plan: Principal Problem:   Acute respiratory failure Active Problems:   Dyslipidemia   Essential hypertension   Cardiomyopathy, ischemic- EF 20-25% 2D 08/08/13   Persistent atrial fibrillation   Decubitus ulcer of coccygeal region-excision and closure Nov 2015   General weakness   Weakness generalized   Protein-calorie malnutrition, severe   Acute on chronic respiratory failure   COPD exacerbation   COPD (chronic obstructive pulmonary disease)  Acute on chronic respiratory failure -this is likely multifactorial from acute on chronic systolic heart failure as well as HCAP and possible A on C COPD.   -Uses 2.5 L of oxygen at home;will continue supplemental oxygen to maintain his saturations above 90%  Acute on chronic systolic CHF exacerbation - Echocardiogram: February 2015: EF 20-25% - Patient with evidence of fluid overload with lower extremity edema, Left poster expiratory wheeze. His BNP was elevated to 4000 higher than one week ago at 1500. - Cardiology consult, appreciate input - Continue Lasix transition to po 12/6, 80 am and 40 pm - Strict I's and O's, daily weights.  HCAP Had been treating with Levaquin for Acute on chronic COPD exacerbation.  Escalated to Vanc and Cefepime on 12/4 as patient is not improving and continues to cough up green sputum.  Patient hospitalized for 5 days in November. Receiving steroids, flutter valve, nebulizers. - now improving, narrow to Levaquin today   COPD exacerbation -Treatment and plan as above - po steroids  Sacral wound - Appreciate PT and Wound Care  consulatations - PT performing hydrotherapy.  They report the patient would benefit from several more hydrotherapy sessions. - Outpatient wound center appointment scheduled 12/11 at 12:45. - Wound vac placed. 12/4.  Scheduled to be changed today  Generalized weakness/deconditioning - Patient refuses SNF.  Will need Home health services. - PT and OT consulted  Severe protein calorie malnutrition - Nutrition consulted - Continue nutritional supplementation - continue Megace, wife appreciates that his appetite has improved  Essential hypertension - Stable, continue Lasix, ARB, amlodipine  Dyslipidemia - Continue statin  Diabetes mellitus, type II - Diet controlled - Will place on ISS with CBG monitoring   Diet: heart healthy Fluids: none DVT Prophylaxis: Lovenox  Code Status: DNR Family Communication:  Disposition Plan: continue current plan, home tomorrow if stable.   Consultants:  Cardiology   Procedures:  None    Antibiotics Antibiotics Given (last 72 hours)    Date/Time Action Medication Dose Rate   06/05/14 1600 Given   vancomycin (VANCOCIN) 500 mg in sodium chloride 0.9 % 100 mL IVPB 500 mg 100 mL/hr   06/05/14 1700 Given   ceFEPIme (MAXIPIME) 1 g in dextrose 5 % 50 mL IVPB 1 g 100 mL/hr   06/05/14 2319 Given   vancomycin (VANCOCIN) 500 mg in sodium chloride 0.9 % 100 mL IVPB 500 mg 100 mL/hr   06/06/14 0130 Given   ceFEPIme (MAXIPIME) 1 g in dextrose 5 % 50 mL IVPB 1 g 100 mL/hr   06/06/14 1129 Given   vancomycin (VANCOCIN) 500 mg in sodium chloride 0.9 % 100 mL IVPB 500 mg 100 mL/hr   06/06/14 1254 Given   ceFEPIme (MAXIPIME)  1 g in dextrose 5 % 50 mL IVPB 1 g 100 mL/hr   06/06/14 2301 Given   vancomycin (VANCOCIN) 500 mg in sodium chloride 0.9 % 100 mL IVPB 500 mg 100 mL/hr   06/07/14 0113 Given   ceFEPIme (MAXIPIME) 1 g in dextrose 5 % 50 mL IVPB 1 g 100 mL/hr   06/07/14 1040 Given   vancomycin (VANCOCIN) IVPB 750 mg/150 ml premix 750 mg 150 mL/hr     06/07/14 1227 Given   ceFEPIme (MAXIPIME) 1 g in dextrose 5 % 50 mL IVPB 1 g 100 mL/hr   06/07/14 2306 Given   vancomycin (VANCOCIN) IVPB 750 mg/150 ml premix 750 mg 150 mL/hr   06/08/14 0042 Given   ceFEPIme (MAXIPIME) 1 g in dextrose 5 % 50 mL IVPB 1 g 100 mL/hr      Studies  No results found. Objective  Filed Vitals:   06/07/14 2054 06/07/14 2205 06/08/14 0500 06/08/14 0643  BP:  121/57  136/54  Pulse:  76    Temp:  98 F (36.7 C)  98.1 F (36.7 C)  TempSrc:  Oral  Oral  Resp:  17  16  Height:      Weight:   63.277 kg (139 lb 8 oz)   SpO2: 97% 100%  100%    Intake/Output Summary (Last 24 hours) at 06/08/14 0722 Last data filed at 06/07/14 1956  Gross per 24 hour  Intake    640 ml  Output   1450 ml  Net   -810 ml   Filed Weights   06/06/14 0551 06/07/14 0500 06/08/14 0500  Weight: 63.05 kg (139 lb) 64.048 kg (141 lb 3.2 oz) 63.277 kg (139 lb 8 oz)   Exam:  General:  NAD, tachypneic but comfortable, lying in bed.  Cardiovascular: RRR,  And 2+ pitting LE edema  Respiratory: decreased breath sounds anteriorly, expiratory wheeze on posterior left.  Abdomen: soft, non tender, thin, no masses  MSK: 2+ LE pitting edema  Neuro: non focal  Skin:  Sacral wound with wound vac.   Data Reviewed: Basic Metabolic Panel:  Recent Labs Lab 06/04/14 0513 06/05/14 0600 06/06/14 0617 06/07/14 0644 06/08/14 0540  NA 137 139 136* 136* 135*  K 4.5 4.7 4.9 5.2 4.6  CL 95* 97 96 98 95*  CO2 32 33* 31 29 33*  GLUCOSE 234* 229* 290* 209* 168*  BUN 36* 41* 42* 36* 33*  CREATININE 0.86 0.95 0.88 0.76 0.82  CALCIUM 8.6 8.6 8.7 8.6 8.6  MG 2.5  --   --   --   --   PHOS 3.3  --   --   --   --    Liver Function Tests:  Recent Labs Lab 06/03/14 1330 06/05/14 0600  AST 17 11  ALT 23 16  ALKPHOS 65 60  BILITOT 0.6 0.4  PROT 6.6 6.2  ALBUMIN 2.7* 2.5*   CBC:  Recent Labs Lab 06/03/14 1330 06/04/14 0513 06/05/14 0600 06/06/14 0617  WBC 12.6* 11.5* 14.0*  14.6*  NEUTROABS 11.7*  --   --   --   HGB 9.8* 8.9* 8.9* 8.8*  HCT 29.4* 28.2* 28.4* 27.2*  MCV 93.6 93.4 94.0 95.4  PLT 165 194 214 215   Cardiac Enzymes:  Recent Labs Lab 06/03/14 1330  TROPONINI <0.30   BNP (last 3 results)  Recent Labs  05/27/14 1430 06/03/14 1328  PROBNP 1578.0* 4268.0*   CBG:  Recent Labs Lab 06/06/14 2119 06/07/14 0806 06/07/14 1159 06/07/14  1709 06/07/14 2201  GLUCAP 336* 192* 289* 378* 288*    Recent Results (from the past 240 hour(s))  Urine culture     Status: None   Collection Time: 05/30/14  1:40 PM  Result Value Ref Range Status   Specimen Description URINE, CATHETERIZED  Final   Special Requests NONE  Final   Culture  Setup Time   Final    05/30/2014 14:10 Performed at [place] Performed at [facility]   Final   Culture NO GROWTH Performed at [facility]   Final   Report Status 05/31/2014 FINAL  Final   Scheduled Meds: . antiseptic oral rinse  7 mL Mouth Rinse BID  . aspirin EC  325 mg Oral QHS  . atorvastatin  10 mg Oral q1800  . budesonide  0.5 mg Nebulization BID  . ceFEPime (MAXIPIME) IV  1 g Intravenous Q12H  . collagenase   Topical Daily  . digoxin  0.125 mg Oral q morning - 10a  . diltiazem  180 mg Oral Daily  . enoxaparin (LOVENOX) injection  40 mg Subcutaneous Q24H  . feeding supplement (ENSURE COMPLETE)  237 mL Oral BID BM  . feeding supplement (GLUCERNA SHAKE)  237 mL Oral BID BM  . ferrous sulfate  325 mg Oral QHS  . furosemide  80 mg Oral Daily   And  . furosemide  40 mg Oral QPC supper  . guaiFENesin  1,200 mg Oral BID  . insulin aspart  0-9 Units Subcutaneous TID WC  . insulin glargine  10 Units Subcutaneous Daily  . ipratropium  0.5 mg Nebulization TID  . irbesartan  150 mg Oral Daily  . levalbuterol  1.25 mg Nebulization TID  . megestrol  20 mg Oral BID  . nitroGLYCERIN  0.2 mg Transdermal Q24H  . potassium chloride SA  20 mEq Oral Daily   . predniSONE  40 mg Oral Q breakfast  . sodium chloride  3 mL Intravenous Q12H  . vancomycin  750 mg Intravenous Q12H   Continuous Infusions: . sodium chloride     Time spent: 15 minutes  Nikol Lemar M. Cruzita Lederer, MD [facility] [phone] If 7 PM - 7 AM, please contact night-coverage at [email or web address], password [facility] 06/08/2014, 7:22 AM  LOS: 5 days

## 2014-06-09 DIAGNOSIS — J438 Other emphysema: Secondary | ICD-10-CM

## 2014-06-09 LAB — GLUCOSE, CAPILLARY: Glucose-Capillary: 105 mg/dL — ABNORMAL HIGH (ref 70–99)

## 2014-06-09 MED ORDER — IRBESARTAN 150 MG PO TABS: 150.0000 mg | ORAL_TABLET | Freq: Every day | ORAL | Status: DC

## 2014-06-09 MED ORDER — MEGESTROL ACETATE 20 MG PO TABS: 20.0000 mg | ORAL_TABLET | Freq: Two times a day (BID) | ORAL | Status: AC

## 2014-06-09 MED ORDER — DILTIAZEM HCL ER COATED BEADS 180 MG PO CP24: 180.0000 mg | ORAL_CAPSULE | Freq: Every day | ORAL | Status: DC

## 2014-06-09 MED ORDER — PREDNISONE 10 MG PO TABS: 10.0000 mg | ORAL_TABLET | Freq: Every day | ORAL | Status: DC

## 2014-06-09 MED ORDER — ALPRAZOLAM 0.25 MG PO TABS: 0.2500 mg | ORAL_TABLET | Freq: Two times a day (BID) | ORAL | Status: AC | PRN

## 2014-06-09 MED ORDER — LEVOFLOXACIN 500 MG PO TABS: 500.0000 mg | ORAL_TABLET | Freq: Every day | ORAL | Status: DC

## 2014-06-09 NOTE — Discharge Instructions (Signed)
You were cared for by a hospitalist during your hospital stay. If you have any questions about your discharge medications or the care you received while you were in the hospital after you are discharged, you can call the unit and asked to speak with the hospitalist on call if the hospitalist that took care of you is not available. Once you are discharged, your primary care physician will handle any further medical issues. Please note that NO REFILLS for any discharge medications will be authorized once you are discharged, as it is imperative that you return to your primary care physician (or establish a relationship with a primary care physician if you do not have one) for your aftercare needs so that they can reassess your need for medications and monitor your lab values.  You can reach the hospitalist office at phone [phone] or fax [phone]   If you do not have a primary care physician, you can call [phone] for a physician referral.  Follow with Primary MD Dorian Heckle, MD in 5-7 days   Get CBC, CMP checked by your doctor and again as further instructed.  Get a 2 view Chest X ray done next visit if you had Pneumonia of Lung problems at the [facility] reviewed and adjusted.  Please request your Prim.MD to go over all Hospital Tests and Procedure/Radiological results at the follow up, please get all Hospital records sent to your Prim MD by signing hospital release before you go home.  Activity: As tolerated with Full fall precautions use walker/cane & assistance as needed  Diet: *heart healthy  For Heart failure patients - Check your Weight same time everyday, if you gain over 2 pounds, or you develop in leg swelling, experience more shortness of breath or chest pain, call your Primary MD immediately. Follow Cardiac Low Salt Diet and 1.8 lit/day fluid restriction.  Disposition Home  If you experience worsening of your admission symptoms, develop shortness of breath,  life threatening emergency, suicidal or homicidal thoughts you must seek medical attention immediately by calling 911 or calling your MD immediately  if symptoms less severe.  You Must read complete instructions/literature along with all the possible adverse reactions/side effects for all the Medicines you take and that have been prescribed to you. Take any new Medicines after you have completely understood and accpet all the possible adverse reactions/side effects.   Do not drive and provide baby sitting services if your were admitted for syncope or siezures until you have seen by Primary MD or a Neurologist and advised to do so again.  Do not drive when taking Pain medications.   Do not take more than prescribed Pain, Sleep and Anxiety Medications  Special Instructions: If you have smoked or chewed Tobacco  in the last 2 yrs please stop smoking, stop any regular Alcohol  and or any Recreational drug use.  Wear Seat belts while driving.

## 2014-06-09 NOTE — Progress Notes (Signed)
Patient stated that he needed to be catheterized. RN at bedside, clot noticed at tip of catheter, with no output noted. Bladder scan performed - only 50mL shown in patient's bladder.

## 2014-06-09 NOTE — Progress Notes (Signed)
NURSING PROGRESS NOTE  [name] [id number] Discharge Data: 06/09/2014 11:21 AM Attending Provider: Caren Griffins, MD [id number], [name], MD     Curt Jews to be D/C'd Home per MD order.  Discussed with the patient the After Visit Summary and all questions fully answered. All IV's discontinued with no bleeding noted. All belongings returned to patient for patient to take home.   Last Vital Signs:  Blood pressure 131/52, pulse 76, temperature 97.8 F (36.6 C), temperature source Oral, resp. rate 18, height 5\' 7"  (1.702 m), weight 63.277 kg (139 lb 8 oz), SpO2 100 %.  Discharge Medication List   Medication List    STOP taking these medications        amLODipine 5 MG tablet  Commonly known as:  NORVASC     candesartan 8 MG tablet  Commonly known as:  ATACAND  Replaced by:  irbesartan 150 MG tablet     carvedilol 25 MG tablet  Commonly known as:  COREG      TAKE these medications        acetaminophen 325 MG tablet  Commonly known as:  TYLENOL  Take 2 tablets (650 mg total) by mouth every 4 (four) hours as needed for headache or mild pain.     albuterol (2.5 MG/3ML) 0.083% nebulizer solution  Commonly known as:  PROVENTIL  Take 2.5 mg by nebulization 4 (four) times daily.     albuterol 108 (90 BASE) MCG/ACT inhaler  Commonly known as:  PROVENTIL HFA;VENTOLIN HFA  Inhale 2 puffs into the lungs every 6 (six) hours as needed for wheezing or shortness of breath.     PROVENTIL HFA 108 (90 BASE) MCG/ACT inhaler  Generic drug:  albuterol  INHALE 1 TO 2 PUFFS INTO THE LUNGS EVERY 6 HOURS AS NEEDED FOR SHORTNESS OF BREATH     ALPRAZolam 0.25 MG tablet  Commonly known as:  XANAX  Take 1 tablet (0.25 mg total) by mouth 2 (two) times daily as needed for anxiety (sob).     aspirin EC 325 MG tablet  Take 325 mg by mouth at bedtime.     budesonide 0.5 MG/2ML nebulizer solution  Commonly known as:  PULMICORT  Take 0.5 mg by nebulization 2 (two) times daily.     collagenase ointment  Commonly known as:  SANTYL  Apply topically 2 (two) times daily.     digoxin 0.125 MG tablet  Commonly known as:  LANOXIN  Take 0.125 mg by mouth every morning.     diltiazem 180 MG 24 hr capsule  Commonly known as:  CARDIZEM CD  Take 1 capsule (180 mg total) by mouth daily.     feeding supplement (ENSURE COMPLETE) Liqd  Take 237 mLs by mouth 2 (two) times daily between meals.     ferrous sulfate 325 (65 FE) MG tablet  Take 325 mg by mouth at bedtime.     furosemide 40 MG tablet  Commonly known as:  LASIX  TAKE 2 TABLETS IN THE MORNING AND 1 TABLET IN THE EVENING     guaiFENesin 600 MG 12 hr tablet  Commonly known as:  MUCINEX  Take 600 mg by mouth 2 (two) times daily as needed. congestion     HYDROcodone-acetaminophen 2.5-500 MG per tablet  Commonly known as:  VICODIN  Take 1 tablet by mouth every 6 (six) hours as needed for pain.     irbesartan 150 MG tablet  Commonly known as:  AVAPRO  Take 1 tablet (150  mg total) by mouth daily.     KLOR-CON M20 20 MEQ tablet  Generic drug:  potassium chloride SA  TAKE 1 TABLET DAILY     levofloxacin 500 MG tablet  Commonly known as:  LEVAQUIN  Take 1 tablet (500 mg total) by mouth daily.     megestrol 20 MG tablet  Commonly known as:  MEGACE  Take 1 tablet (20 mg total) by mouth 2 (two) times daily.     mupirocin ointment 2 %  Commonly known as:  BACTROBAN  Apply 1 application topically 2 (two) times daily as needed (Cuts and bruises).     nitroGLYCERIN 0.4 MG SL tablet  Commonly known as:  NITROSTAT  Place 0.4 mg under the tongue every 5 (five) minutes as needed for chest pain.     nitroGLYCERIN 0.2 mg/hr patch  Commonly known as:  NITRODUR - Dosed in mg/24 hr  Place 1 patch onto the skin daily.     predniSONE 10 MG tablet  Commonly known as:  DELTASONE  Take 1 tablet (10 mg total) by mouth daily. Take 20 mg (2 tablets) for 4 days then return to 10 mg daily     PRESCRIPTION MEDICATION  Uses  1L-2L of home Oxygen     simvastatin 20 MG tablet  Commonly known as:  ZOCOR  Take 20 mg by mouth at bedtime.     tiotropium 18 MCG inhalation capsule  Commonly known as:  SPIRIVA  Place 18 mcg into inhaler and inhale daily.         Wallie Renshaw, RN

## 2014-06-09 NOTE — Discharge Summary (Signed)
Physician Discharge Summary  [name] YHC:[id number] DOB: Nov 15, 1935 DOA: 06/03/2014  PCP: Dorian Heckle, MD  Admit date: 06/03/2014 Discharge date: 06/09/2014  Time spent: 45 minutes  Recommendations for Outpatient Follow-up:  1. Follow-up with primary care provider in one to 2 weeks 2. Follow-up with cardiology in 1-2 weeks 3. Follow-up with wound care in 1-2 weeks   Discharge Diagnoses:  Principal Problem:   Acute respiratory failure Active Problems:   Dyslipidemia   Essential hypertension   Cardiomyopathy, ischemic- EF 20-25% 2D 08/08/13   Persistent atrial fibrillation   Decubitus ulcer of coccygeal region-excision and closure Nov 2015   General weakness   Weakness generalized   Protein-calorie malnutrition, severe   Acute on chronic respiratory failure   COPD exacerbation   COPD (chronic obstructive pulmonary disease)  Discharge Condition: stable  Diet recommendation: heart healthy, low salt  Filed Weights   06/06/14 0551 06/07/14 0500 06/08/14 0500  Weight: 63.05 kg (139 lb) 64.048 kg (141 lb 3.2 oz) 63.277 kg (139 lb 8 oz)    History of present illness:  Robert FILIP is a 78 y.o. male With a history of CHF with an EF of 20-25%, pacemaker/I CD placement, diabetes mellitus, COPD, presented to the emergency department from [facility] facility for shortness of breath. Patient was recently admitted and discharged 05/27/2014 to 06/02/2014 for acute on chronic respiratory failure. Patient states that he was not as short of breath yesterday however became very short of breath at the nursing home today. Patient also stated that his Lasix had been changed recently after discharge. He feels that his legs have been swelling more than usual. Patient has been using his nebulizer treatments as well as taking his medications. Patient has continued to cough up green sputum. Patient was brought to the emergency department by EMS. He had been given 3 DuoNeb treatments  by the emergency department. Chest x-ray showed no active cardiopulmonary process. [facility] was called for admission.  Hospital Course:  Acute on chronic respiratory failure - this is likely multifactorial from acute on chronic systolic heart failure as well as HCAP and possible A on C COPD.  - Uses 2.5 L of oxygen at home;will continue supplemental oxygen to maintain his saturations above 90% - Patient's respiratory status improved significantly with antibiotics and diuresis, and he is at baseline on discharge. Acute on chronic systolic CHF exacerbation - Echocardiogram: February 2015: EF 20-25% - On admission patient with evidence of fluid overload with lower extremity edema, Left poster expiratory wheeze. His BNP was elevated to 4000 higher than one week ago at 1500. - Cardiology consulted and followed patient while hospitalized - Patient was diuresed with IV Lasix, with good response, improvement in his respiratory status, and was transitioned to by mouth Lasix on 12/6, 80 mg in the morning and 40 mg in the afternoon. He was net -8 L on discharge. His renal function has remained stable, and he is to go home on Lasix twice daily as above. He was counseled on daily weights and a low-sodium diet.  HCAP Had been treating with Levaquin for Acute on chronic COPD exacerbation. Escalated to Vanc and Cefepime on 12/4 as patient is not improving and continues to cough up green sputum.Patient improved and his antibiotics were de-escalated to levofloxacin on discharge. COPD exacerbation - Treatment and plan as above -  he'll go home on a quick steroid taper then he is to resume his chronic steroid dose Sacral wound - Appreciate PT and Wound Care  consulatations, patient had a wound VAC placed on 12/4. This was changed on Monday 12/7. He has an outpatient wound center appointment scheduled 12/11 at 12:45.  Generalized weakness/deconditioning - Patient refuses SNF, and per his choice he was discharged home,  and home health PT/RM/8/respiratory therapist were ordered. Care management assisted in discharge planning.  Severe protein calorie malnutrition - Nutrition consulted - Continue nutritional supplementation - continue Megace, wife appreciates that his appetite has improved  Essential hypertension - Stable, continue Lasix, Avapro at 150, digoxin, his Norvasc was discontinued and he was started on Cardizem - His Coreg was also discontinued per cardiology  Dyslipidemia - Continue statin  Diabetes mellitus, type II - Diet controlled - Will place on ISS with CBG monitoring  Procedures:  None     Consultations:  Cardiology   Discharge Exam: Filed Vitals:   06/08/14 2014 06/08/14 2114 06/09/14 0614 06/09/14 1100  BP:  119/54 125/52 131/52  Pulse:      Temp:  97.6 F (36.4 C) 97.8 F (36.6 C)   TempSrc:  Oral Oral   Resp:  22 18   Height:      Weight:      SpO2: 99% 99% 100%     General: NAD Cardiovascular: RRR Respiratory: CTA biL, decreased breath sounds throughout   Discharge Instructions     Medication List    STOP taking these medications        amLODipine 5 MG tablet  Commonly known as:  NORVASC     candesartan 8 MG tablet  Commonly known as:  ATACAND  Replaced by:  irbesartan 150 MG tablet     carvedilol 25 MG tablet  Commonly known as:  COREG      TAKE these medications        acetaminophen 325 MG tablet  Commonly known as:  TYLENOL  Take 2 tablets (650 mg total) by mouth every 4 (four) hours as needed for headache or mild pain.     albuterol (2.5 MG/3ML) 0.083% nebulizer solution  Commonly known as:  PROVENTIL  Take 2.5 mg by nebulization 4 (four) times daily.     albuterol 108 (90 BASE) MCG/ACT inhaler  Commonly known as:  PROVENTIL HFA;VENTOLIN HFA  Inhale 2 puffs into the lungs every 6 (six) hours as needed for wheezing or shortness of breath.     PROVENTIL HFA 108 (90 BASE) MCG/ACT inhaler  Generic drug:  albuterol  INHALE 1 TO 2  PUFFS INTO THE LUNGS EVERY 6 HOURS AS NEEDED FOR SHORTNESS OF BREATH     ALPRAZolam 0.25 MG tablet  Commonly known as:  XANAX  Take 1 tablet (0.25 mg total) by mouth 2 (two) times daily as needed for anxiety (sob).     aspirin EC 325 MG tablet  Take 325 mg by mouth at bedtime.     budesonide 0.5 MG/2ML nebulizer solution  Commonly known as:  PULMICORT  Take 0.5 mg by nebulization 2 (two) times daily.     collagenase ointment  Commonly known as:  SANTYL  Apply topically 2 (two) times daily.     digoxin 0.125 MG tablet  Commonly known as:  LANOXIN  Take 0.125 mg by mouth every morning.     diltiazem 180 MG 24 hr capsule  Commonly known as:  CARDIZEM CD  Take 1 capsule (180 mg total) by mouth daily.     feeding supplement (ENSURE COMPLETE) Liqd  Take 237 mLs by mouth 2 (two) times daily between meals.  ferrous sulfate 325 (65 FE) MG tablet  Take 325 mg by mouth at bedtime.     furosemide 40 MG tablet  Commonly known as:  LASIX  TAKE 2 TABLETS IN THE MORNING AND 1 TABLET IN THE EVENING     guaiFENesin 600 MG 12 hr tablet  Commonly known as:  MUCINEX  Take 600 mg by mouth 2 (two) times daily as needed. congestion     HYDROcodone-acetaminophen 2.5-500 MG per tablet  Commonly known as:  VICODIN  Take 1 tablet by mouth every 6 (six) hours as needed for pain.     irbesartan 150 MG tablet  Commonly known as:  AVAPRO  Take 1 tablet (150 mg total) by mouth daily.     KLOR-CON M20 20 MEQ tablet  Generic drug:  potassium chloride SA  TAKE 1 TABLET DAILY     levofloxacin 500 MG tablet  Commonly known as:  LEVAQUIN  Take 1 tablet (500 mg total) by mouth daily.     megestrol 20 MG tablet  Commonly known as:  MEGACE  Take 1 tablet (20 mg total) by mouth 2 (two) times daily.     mupirocin ointment 2 %  Commonly known as:  BACTROBAN  Apply 1 application topically 2 (two) times daily as needed (Cuts and bruises).     nitroGLYCERIN 0.4 MG SL tablet  Commonly known as:   NITROSTAT  Place 0.4 mg under the tongue every 5 (five) minutes as needed for chest pain.     nitroGLYCERIN 0.2 mg/hr patch  Commonly known as:  NITRODUR - Dosed in mg/24 hr  Place 1 patch onto the skin daily.     predniSONE 10 MG tablet  Commonly known as:  DELTASONE  Take 1 tablet (10 mg total) by mouth daily. Take 20 mg (2 tablets) for 4 days then return to 10 mg daily     PRESCRIPTION MEDICATION  Uses 1L-2L of home Oxygen     simvastatin 20 MG tablet  Commonly known as:  ZOCOR  Take 20 mg by mouth at bedtime.     tiotropium 18 MCG inhalation capsule  Commonly known as:  SPIRIVA  Place 18 mcg into inhaler and inhale daily.           Follow-up Information    Follow up with Dorian Heckle, MD. Schedule an appointment as soon as possible for a visit on 06/18/2014.   Specialty:  Internal Medicine   Why:  Appointment  June 18 2013 at 10:15 am   Contact information:   [place] [address] [phone]       Follow up with Cristopher Peru, MD. Schedule an appointment as soon as possible for a visit in 2 weeks.   Specialty:  Cardiology   Contact information:   [address] [address] [phone]       Follow up with [facility].   Why:  HHRN, PT, OT aide Social Worker and [place] information:   [place] [address] [phone]       The results of significant diagnostics from this hospitalization (including imaging, microbiology, ancillary and laboratory) are listed below for reference.    Significant Diagnostic Studies: Dg Chest Port 1 View  06/03/2014   CLINICAL DATA:  Shortness of breath  EXAM: PORTABLE CHEST - 1 VIEW  COMPARISON:  Portable chest x-ray of 05/29/2014  FINDINGS: The lungs are clear but  slightly hyperaerated. Mediastinal and hilar contours are unchanged. The heart is enlarged and stable with AICD leads noted.  IMPRESSION: Stable cardiomegaly.   No active lung disease.   Electronically Signed   By: Ivar Drape M.D.   On: 06/03/2014 13:45   Dg Chest Port 1 View  05/29/2014   CLINICAL DATA:  Dyspnea, cough.  EXAM: PORTABLE CHEST - 1 VIEW  COMPARISON:  May 27, 2014.  FINDINGS: The heart size and mediastinal contours are within normal limits. Sternotomy wires are noted. Left-sided pacemaker is unchanged in position. No pneumothorax or pleural effusion is noted. Both lungs are clear. The visualized skeletal structures are unremarkable.  IMPRESSION: No acute cardiopulmonary abnormality seen.   Electronically Signed   By: Sabino Dick M.D.   On: 05/29/2014 19:33   Dg Chest Port 1 View  05/27/2014   CLINICAL DATA:  Weakness, short of breath  EXAM: PORTABLE CHEST - 1 VIEW  COMPARISON:  08/08/2013  FINDINGS: Left-sided pacemaker overlies stable enlarged cardiac silhouette. No effusion, infiltrate, pneumothorax.No acute cardiopulmonary process.  IMPRESSION: No acute cardiopulmonary process.   Electronically Signed   By: Suzy Bouchard M.D.   On: 05/27/2014 14:53    Microbiology: Recent Results (from the past 240 hour(s))  Urine culture     Status: None   Collection Time: 05/30/14  1:40 PM  Result Value Ref Range Status   Specimen Description URINE, CATHETERIZED  Final   Special Requests NONE  Final   Culture  Setup Time   Final    05/30/2014 14:10 Performed at [place] Performed at [facility]   Final   Culture NO GROWTH Performed at [facility]   Final   Report Status 05/31/2014 FINAL  Final     Labs: Basic Metabolic Panel:  Recent Labs Lab 06/04/14 0513 06/05/14 0600 06/06/14 0617 06/07/14 0644 06/08/14 0540  NA 137 139 136* 136* 135*  K 4.5 4.7 4.9 5.2 4.6  CL 95* 97 96 98 95*  CO2 32 33* 31 29 33*  GLUCOSE 234* 229* 290* 209* 168*  BUN 36* 41* 42* 36* 33*  CREATININE 0.86 0.95 0.88 0.76 0.82  CALCIUM 8.6 8.6 8.7 8.6 8.6  MG 2.5  --   --   --   --     PHOS 3.3  --   --   --   --    Liver Function Tests:  Recent Labs Lab 06/03/14 1330 06/05/14 0600  AST 17 11  ALT 23 16  ALKPHOS 65 60  BILITOT 0.6 0.4  PROT 6.6 6.2  ALBUMIN 2.7* 2.5*   CBC:  Recent Labs Lab 06/03/14 1330 06/04/14 0513 06/05/14 0600 06/06/14 0617  WBC 12.6* 11.5* 14.0* 14.6*  NEUTROABS 11.7*  --   --   --   HGB 9.8* 8.9* 8.9* 8.8*  HCT 29.4* 28.2* 28.4* 27.2*  MCV 93.6 93.4 94.0 95.4  PLT 165 194 214 215   Cardiac Enzymes:  Recent Labs Lab 06/03/14 1330  TROPONINI <0.30   BNP: BNP (last 3 results)  Recent Labs  05/27/14 1430 06/03/14 1328  PROBNP 1578.0* 4268.0*   CBG:  Recent Labs Lab 06/08/14 0811 06/08/14 1209 06/08/14 1730 06/08/14 2116 06/09/14 0814  GLUCAP 137* 187* 249* 278* 105*     Signed:  Jahshua Bonito  [facility] 06/09/2014, 1:06 PM

## 2014-06-10 ENCOUNTER — Encounter: Payer: Self-pay | Admitting: Pulmonary Disease

## 2014-06-11 ENCOUNTER — Telehealth: Payer: Self-pay | Admitting: *Deleted

## 2014-06-11 ENCOUNTER — Encounter (HOSPITAL_COMMUNITY): Payer: Self-pay | Admitting: Surgery

## 2014-06-11 NOTE — Telephone Encounter (Signed)
Spoke w/pt--pt received shock this am and has been trying to send transmission all day but unsuccessful. Pt just got discharged from hospital on Tuesday and per wife to weak to come to office. Erasmo Downer will go to pts home and check device. Also, ordering Wire[employer] cell adapter for pt to try and hook up to monitor.

## 2014-06-12 ENCOUNTER — Encounter (HOSPITAL_BASED_OUTPATIENT_CLINIC_OR_DEPARTMENT_OTHER): Payer: Medicare Other | Attending: Internal Medicine

## 2014-06-12 ENCOUNTER — Ambulatory Visit (INDEPENDENT_AMBULATORY_CARE_PROVIDER_SITE_OTHER): Payer: Medicare Other | Admitting: *Deleted

## 2014-06-12 ENCOUNTER — Telehealth: Payer: Self-pay | Admitting: Pulmonary Disease

## 2014-06-12 DIAGNOSIS — I255 Ischemic cardiomyopathy: Secondary | ICD-10-CM

## 2014-06-12 NOTE — Telephone Encounter (Signed)
atc pt, line rang several times with no option to leave vm.  Wcb.

## 2014-06-15 ENCOUNTER — Telehealth (INDEPENDENT_AMBULATORY_CARE_PROVIDER_SITE_OTHER): Payer: Self-pay

## 2014-06-15 LAB — MDC_IDC_ENUM_SESS_TYPE_INCLINIC
Battery Remaining Longevity: 76 mo
Battery Voltage: 2.91 V
Brady Statistic AS VS Percent: 34.15 %
Brady Statistic RA Percent Paced: 0 %
HIGH POWER IMPEDANCE MEASURED VALUE: 190 Ohm
HIGH POWER IMPEDANCE MEASURED VALUE: 43 Ohm
HighPow Impedance: 52 Ohm
Lead Channel Impedance Value: 456 Ohm
Lead Channel Impedance Value: 475 Ohm
Lead Channel Pacing Threshold Amplitude: 0.5 V
Lead Channel Pacing Threshold Amplitude: 0.625 V
Lead Channel Pacing Threshold Pulse Width: 0.4 ms
Lead Channel Pacing Threshold Pulse Width: 0.4 ms
Lead Channel Sensing Intrinsic Amplitude: 22 mV
Lead Channel Setting Pacing Amplitude: 1.75 V
Lead Channel Setting Pacing Amplitude: 2.5 V
Lead Channel Setting Pacing Pulse Width: 0.4 ms
Lead Channel Setting Sensing Sensitivity: 0.3 mV
MDC IDC MSMT LEADCHNL LV IMPEDANCE VALUE: 4047 Ohm
MDC IDC MSMT LEADCHNL LV IMPEDANCE VALUE: 4047 Ohm
MDC IDC MSMT LEADCHNL RA IMPEDANCE VALUE: 532 Ohm
MDC IDC MSMT LEADCHNL RA PACING THRESHOLD AMPLITUDE: 0.5 V
MDC IDC MSMT LEADCHNL RA SENSING INTR AMPL: 0.75 mV
MDC IDC MSMT LEADCHNL RA SENSING INTR AMPL: 0.75 mV
MDC IDC MSMT LEADCHNL RV PACING THRESHOLD PULSEWIDTH: 0.4 ms
MDC IDC MSMT LEADCHNL RV SENSING INTR AMPL: 22 mV
MDC IDC SESS DTM: 20151211225924
MDC IDC SET LEADCHNL RV PACING PULSEWIDTH: 0.4 ms
MDC IDC SET ZONE DETECTION INTERVAL: 300 ms
MDC IDC SET ZONE DETECTION INTERVAL: 340 ms
MDC IDC SET ZONE DETECTION INTERVAL: 350 ms
MDC IDC STAT BRADY AP VP PERCENT: 0 %
MDC IDC STAT BRADY AP VS PERCENT: 0 %
MDC IDC STAT BRADY AS VP PERCENT: 65.85 %
MDC IDC STAT BRADY RV PERCENT PACED: 69.25 %
Zone Setting Detection Interval: 430 ms
Zone Setting Detection Interval: 460 ms

## 2014-06-15 NOTE — Progress Notes (Signed)
Device checked for shock on 06-11-14. VF episode on 06-11-14 around 930 am with failed ATP resulting in 30 J shock. ? AF w/RVR. Shock also resulted in SR. Pt has history of chronic AF with only anticoagulation being ASA 325 mg. Pt has history of bleed. Plan per GT. Pt also enrolled in ICM clinic.

## 2014-06-15 NOTE — Telephone Encounter (Signed)
I spoke with the [name] and he states that he wanted to give [place] an update on how he was doing. He states he was in the hospital recently but is feeling much better. He states he is still having some fatigue. I advised the [name] that he needs an appt to follow-up with [place] since last seen in June and he has been in the hospital since last OV. [name] states she will have his wife call us back to set the appt but that he just wanted to let us know he was doing well. [place] Bing, CMA

## 2014-06-15 NOTE — Telephone Encounter (Signed)
Home Health nurse called stated that pt is currently on a wnd vac, they would like an order for Santyl to the wnd bed. Please advise. Thank you

## 2014-06-16 ENCOUNTER — Telehealth: Payer: Self-pay | Admitting: Interventional Cardiology

## 2014-06-16 NOTE — Telephone Encounter (Signed)
Returned call to home health nurse Levada Dy, RN @ [name]. She reports that she was ay pt home today. Pt bp was 90/48, 94/42. bp yesterday 92/46. Pt does have some LE edema.otherwise pt is asymptomatic  After reviewing pt last d/c instruction form [place]. Pt was to stop Carvedilol and Amlodipine. Levada Dy reports that pt has still been taking those medications. She will adv pt to d/c. She will be back at pt home tomorrow and call back if further assistance is needed

## 2014-06-16 NOTE — Telephone Encounter (Signed)
New Msg   Donaciano Eva RN from [place] calling states pt has low blood pressure. Today's readings 90/48, 94/42   Yesterday 92/46 and edema in lower extremities Please contact Angela at [phone].

## 2014-06-22 ENCOUNTER — Encounter: Payer: Medicare Other | Admitting: *Deleted

## 2014-06-22 ENCOUNTER — Other Ambulatory Visit: Payer: Self-pay | Admitting: Internal Medicine

## 2014-06-24 ENCOUNTER — Other Ambulatory Visit: Payer: Self-pay | Admitting: Pulmonary Disease

## 2014-06-27 ENCOUNTER — Encounter (HOSPITAL_COMMUNITY): Payer: Self-pay | Admitting: Emergency Medicine

## 2014-06-27 ENCOUNTER — Emergency Department (HOSPITAL_COMMUNITY): Payer: Medicare Other

## 2014-06-27 ENCOUNTER — Inpatient Hospital Stay (HOSPITAL_COMMUNITY)
Admission: EM | Admit: 2014-06-27 | Discharge: 2014-07-17 | DRG: 811 | Disposition: A | Discharge status: Home Health | Payer: Medicare Other | Attending: Cardiovascular Disease | Admitting: Cardiovascular Disease

## 2014-06-27 DIAGNOSIS — D509 Iron deficiency anemia, unspecified: Principal | ICD-10-CM | POA: Diagnosis present

## 2014-06-27 DIAGNOSIS — R000 Tachycardia, unspecified: Secondary | ICD-10-CM | POA: Diagnosis present

## 2014-06-27 DIAGNOSIS — I4819 Other persistent atrial fibrillation: Secondary | ICD-10-CM | POA: Diagnosis present

## 2014-06-27 DIAGNOSIS — I4729 Other ventricular tachycardia: Secondary | ICD-10-CM

## 2014-06-27 DIAGNOSIS — I472 Ventricular tachycardia: Secondary | ICD-10-CM

## 2014-06-27 DIAGNOSIS — I481 Persistent atrial fibrillation: Secondary | ICD-10-CM | POA: Diagnosis present

## 2014-06-27 DIAGNOSIS — Z9981 Dependence on supplemental oxygen: Secondary | ICD-10-CM

## 2014-06-27 DIAGNOSIS — Z85038 Personal history of other malignant neoplasm of large intestine: Secondary | ICD-10-CM

## 2014-06-27 DIAGNOSIS — E119 Type 2 diabetes mellitus without complications: Secondary | ICD-10-CM | POA: Diagnosis present

## 2014-06-27 DIAGNOSIS — I10 Essential (primary) hypertension: Secondary | ICD-10-CM | POA: Diagnosis present

## 2014-06-27 DIAGNOSIS — E785 Hyperlipidemia, unspecified: Secondary | ICD-10-CM | POA: Diagnosis present

## 2014-06-27 DIAGNOSIS — G9340 Encephalopathy, unspecified: Secondary | ICD-10-CM | POA: Diagnosis not present

## 2014-06-27 DIAGNOSIS — N319 Neuromuscular dysfunction of bladder, unspecified: Secondary | ICD-10-CM | POA: Diagnosis present

## 2014-06-27 DIAGNOSIS — R627 Adult failure to thrive: Secondary | ICD-10-CM | POA: Diagnosis present

## 2014-06-27 DIAGNOSIS — J449 Chronic obstructive pulmonary disease, unspecified: Secondary | ICD-10-CM | POA: Diagnosis present

## 2014-06-27 DIAGNOSIS — Z87891 Personal history of nicotine dependence: Secondary | ICD-10-CM

## 2014-06-27 DIAGNOSIS — I959 Hypotension, unspecified: Secondary | ICD-10-CM | POA: Diagnosis present

## 2014-06-27 DIAGNOSIS — R7989 Other specified abnormal findings of blood chemistry: Secondary | ICD-10-CM

## 2014-06-27 DIAGNOSIS — L089 Local infection of the skin and subcutaneous tissue, unspecified: Secondary | ICD-10-CM | POA: Diagnosis present

## 2014-06-27 DIAGNOSIS — I340 Nonrheumatic mitral (valve) insufficiency: Secondary | ICD-10-CM | POA: Diagnosis present

## 2014-06-27 DIAGNOSIS — Z515 Encounter for palliative care: Secondary | ICD-10-CM

## 2014-06-27 DIAGNOSIS — K922 Gastrointestinal hemorrhage, unspecified: Secondary | ICD-10-CM | POA: Diagnosis present

## 2014-06-27 DIAGNOSIS — I5022 Chronic systolic (congestive) heart failure: Secondary | ICD-10-CM

## 2014-06-27 DIAGNOSIS — R52 Pain, unspecified: Secondary | ICD-10-CM | POA: Diagnosis present

## 2014-06-27 DIAGNOSIS — K5641 Fecal impaction: Secondary | ICD-10-CM | POA: Diagnosis present

## 2014-06-27 DIAGNOSIS — J9620 Acute and chronic respiratory failure, unspecified whether with hypoxia or hypercapnia: Secondary | ICD-10-CM | POA: Diagnosis present

## 2014-06-27 DIAGNOSIS — Z7982 Long term (current) use of aspirin: Secondary | ICD-10-CM

## 2014-06-27 DIAGNOSIS — R778 Other specified abnormalities of plasma proteins: Secondary | ICD-10-CM | POA: Diagnosis present

## 2014-06-27 DIAGNOSIS — Z7951 Long term (current) use of inhaled steroids: Secondary | ICD-10-CM

## 2014-06-27 DIAGNOSIS — J9610 Chronic respiratory failure, unspecified whether with hypoxia or hypercapnia: Secondary | ICD-10-CM | POA: Diagnosis present

## 2014-06-27 DIAGNOSIS — R410 Disorientation, unspecified: Secondary | ICD-10-CM | POA: Diagnosis not present

## 2014-06-27 DIAGNOSIS — T148XXA Other injury of unspecified body region, initial encounter: Secondary | ICD-10-CM

## 2014-06-27 DIAGNOSIS — Z7409 Other reduced mobility: Secondary | ICD-10-CM | POA: Diagnosis present

## 2014-06-27 DIAGNOSIS — E875 Hyperkalemia: Secondary | ICD-10-CM | POA: Diagnosis not present

## 2014-06-27 DIAGNOSIS — Z9841 Cataract extraction status, right eye: Secondary | ICD-10-CM

## 2014-06-27 DIAGNOSIS — L89154 Pressure ulcer of sacral region, stage 4: Secondary | ICD-10-CM | POA: Diagnosis present

## 2014-06-27 DIAGNOSIS — T798XXD Other early complications of trauma, subsequent encounter: Secondary | ICD-10-CM

## 2014-06-27 DIAGNOSIS — I480 Paroxysmal atrial fibrillation: Secondary | ICD-10-CM | POA: Diagnosis present

## 2014-06-27 DIAGNOSIS — R0602 Shortness of breath: Secondary | ICD-10-CM

## 2014-06-27 DIAGNOSIS — J439 Emphysema, unspecified: Secondary | ICD-10-CM | POA: Diagnosis present

## 2014-06-27 DIAGNOSIS — E43 Unspecified severe protein-calorie malnutrition: Secondary | ICD-10-CM | POA: Diagnosis present

## 2014-06-27 DIAGNOSIS — E46 Unspecified protein-calorie malnutrition: Secondary | ICD-10-CM | POA: Diagnosis present

## 2014-06-27 DIAGNOSIS — I4891 Unspecified atrial fibrillation: Secondary | ICD-10-CM | POA: Diagnosis present

## 2014-06-27 DIAGNOSIS — J984 Other disorders of lung: Secondary | ICD-10-CM | POA: Diagnosis present

## 2014-06-27 DIAGNOSIS — D649 Anemia, unspecified: Secondary | ICD-10-CM | POA: Diagnosis present

## 2014-06-27 DIAGNOSIS — R339 Retention of urine, unspecified: Secondary | ICD-10-CM | POA: Diagnosis present

## 2014-06-27 DIAGNOSIS — N179 Acute kidney failure, unspecified: Secondary | ICD-10-CM | POA: Diagnosis present

## 2014-06-27 DIAGNOSIS — I509 Heart failure, unspecified: Secondary | ICD-10-CM

## 2014-06-27 DIAGNOSIS — I482 Chronic atrial fibrillation: Secondary | ICD-10-CM | POA: Diagnosis present

## 2014-06-27 DIAGNOSIS — I82629 Acute embolism and thrombosis of deep veins of unspecified upper extremity: Secondary | ICD-10-CM | POA: Diagnosis present

## 2014-06-27 DIAGNOSIS — I5023 Acute on chronic systolic (congestive) heart failure: Secondary | ICD-10-CM | POA: Diagnosis present

## 2014-06-27 DIAGNOSIS — I2510 Atherosclerotic heart disease of native coronary artery without angina pectoris: Secondary | ICD-10-CM | POA: Diagnosis present

## 2014-06-27 DIAGNOSIS — Z961 Presence of intraocular lens: Secondary | ICD-10-CM | POA: Diagnosis present

## 2014-06-27 DIAGNOSIS — K449 Diaphragmatic hernia without obstruction or gangrene: Secondary | ICD-10-CM | POA: Diagnosis present

## 2014-06-27 DIAGNOSIS — I248 Other forms of acute ischemic heart disease: Secondary | ICD-10-CM | POA: Diagnosis present

## 2014-06-27 DIAGNOSIS — R531 Weakness: Secondary | ICD-10-CM

## 2014-06-27 DIAGNOSIS — J441 Chronic obstructive pulmonary disease with (acute) exacerbation: Secondary | ICD-10-CM | POA: Diagnosis present

## 2014-06-27 DIAGNOSIS — Z7952 Long term (current) use of systemic steroids: Secondary | ICD-10-CM

## 2014-06-27 DIAGNOSIS — L89159 Pressure ulcer of sacral region, unspecified stage: Secondary | ICD-10-CM | POA: Diagnosis present

## 2014-06-27 DIAGNOSIS — I255 Ischemic cardiomyopathy: Secondary | ICD-10-CM | POA: Diagnosis present

## 2014-06-27 DIAGNOSIS — T462X5A Adverse effect of other antidysrhythmic drugs, initial encounter: Secondary | ICD-10-CM

## 2014-06-27 DIAGNOSIS — Z9842 Cataract extraction status, left eye: Secondary | ICD-10-CM

## 2014-06-27 DIAGNOSIS — Z8673 Personal history of transient ischemic attack (TIA), and cerebral infarction without residual deficits: Secondary | ICD-10-CM

## 2014-06-27 DIAGNOSIS — Z951 Presence of aortocoronary bypass graft: Secondary | ICD-10-CM

## 2014-06-27 DIAGNOSIS — Z95810 Presence of automatic (implantable) cardiac defibrillator: Secondary | ICD-10-CM | POA: Diagnosis present

## 2014-06-27 DIAGNOSIS — Z981 Arthrodesis status: Secondary | ICD-10-CM

## 2014-06-27 DIAGNOSIS — Z79818 Long term (current) use of other agents affecting estrogen receptors and estrogen levels: Secondary | ICD-10-CM

## 2014-06-27 HISTORY — DX: Adverse effect of other antidysrhythmic drugs, initial encounter: T46.2X5A

## 2014-06-27 HISTORY — DX: Permanent atrial fibrillation: I48.21

## 2014-06-27 HISTORY — DX: Other reduced mobility: Z74.09

## 2014-06-27 HISTORY — DX: Other disorders of lung: J98.4

## 2014-06-27 HISTORY — DX: Retention of urine, unspecified: R33.9

## 2014-06-27 HISTORY — DX: Traumatic subdural hemorrhage with loss of consciousness of unspecified duration, initial encounter: S06.5X9A

## 2014-06-27 MED ORDER — ASPIRIN 81 MG PO CHEW: 324.0000 mg | CHEWABLE_TABLET | Freq: Once | ORAL | Status: DC

## 2014-06-27 MED ORDER — SODIUM CHLORIDE 0.9 % IV BOLUS (SEPSIS)
250.0000 mL | Freq: Once | INTRAVENOUS | Status: AC
  Administered 2014-06-27: 250 mL via INTRAVENOUS

## 2014-06-27 NOTE — ED Provider Notes (Signed)
CSN: [id number]     Arrival date & time 06/27/14  2304 History  This chart was scribed for Ephraim Hamburger, MD by Chester Holstein, ED Scribe. This patient was seen in room [facility] and the patient's care was started at 11:06 PM.    Chief Complaint  Patient presents with  . Respiratory Distress    The history is provided by the patient. No language interpreter was used.    HPI Comments: HPI Comments: [name] is a 78 y.o. male brought in by ambulance, with a PMHx of heart failure and COPD who presents to the Emergency Department complaining of constant SOB with onset 1 hour PTA.  Pt states he was trying to go to bed at onset. Pt notes associated chest pain which has resolved. Pt notes associated leg swelling with onset yesterday. He states this is a symptom for previous heart failure.  Pt notes the SOB feels similar to previous heart failure but not like COPD. Pt was given NTG patch and placed on CPAP in ambulance.  Per EMS his systolic BP was 326 and his heart rate was between 115-135.   Pt notes he uses approximately 2.5 L of oxygen at home. Pt denies eating heavy amounts of salty foods recently.  Pt was seen in the ED 06/03/14 for SOB and admitted. Pt denies cough and fever. Pt's cardiologist is Dr. Tamala Julian.   Past Medical History  Diagnosis Date  . Hypokalemia   . PVD (peripheral vascular disease)   . HTN (hypertension)   . CHF (congestive heart failure)   . Ventricular tachycardia   . Ischemic heart disease, chronic   . Dyslipidemia   . CAD (coronary artery disease)   . On home oxygen therapy     "~ 2.5L; pretty much 24/7" (05/27/2014)  . Pacemaker   . Self-catheterizes urinary bladder     since back surgery.  6- 7 times a day. (05/27/2014)  . Difficult intubation     pt states "not aware of difficult intubation, never told"  . Heart murmur     dx 1974  . Shortness of breath   . Head injury, closed, with brief LOC 2013  . Neurogenic bladder     since back surgery -  self caths  . Neurogenic bowel     digitally stimulates daily.  Marland Kitchen COPD (chronic obstructive pulmonary disease)      PT USES OXYGEN 24 HRS A DAY - 2 L NASAL CANNULA  . Emphysema   . Anemia     SERUM MONOCLONAL IgM KAPPA PROTEIN - NO CLINICAL EVIDENCE FOR PROGRESSION TO MULTIPLE MYELOMA OR OTHER PROLIFERATIVE DISORDER - PER OFFICE NOTE DR. SHERRILL DATED 12/30/13  . Pneumonia X 2  . Borderline type 2 diabetes mellitus   . AICD (automatic cardioverter/defibrillator) present   . TIA (transient ischemic attack) 1980's    "they say I did; I don't believe it"  . Colon cancer   . MRSA infection    Past Surgical History  Procedure Laterality Date  . Lumbar disc surgery  X 2  . Iliac artery stent Left   . Bi-ventricular implantable cardioverter defibrillator  (crt-d)  04-10-08    implantation of Medtronic Concerto Bi-V ICD [; Cristopher Peru MD  . Colonoscopy    . Polypectomy    . Insertion of iliac stent Right 06/04/2012    external   . Cardiac catheterization    . Insert / replace / remove pacemaker    . Endarterectomy femoral  06/28/2012  Procedure: ENDARTERECTOMY FEMORAL;  Surgeon: Serafina Mitchell, MD;  Location: [place];  Service: Vascular;  Laterality: Left;  . Patch angioplasty  06/28/2012    Procedure: PATCH ANGIOPLASTY;  Surgeon: Serafina Mitchell, MD;  Location: [facility] OR;  Service: Vascular;  Laterality: Left;  Using 0.8 cm x 15.2 cm Hemashield Patch.  . Colon surgery  03/2004  . Cataract extraction w/ intraocular lens  implant, bilateral Bilateral   . Cholecystectomy    . Coronary artery bypass graft  1998    x3 vessels  . Femoral-popliteal bypass graft Right 03/28/2013    Procedure: BYPASS GRAFT FEMORAL-BELOW KNEE POPLITEAL ARTERY Using 80m x 80 cm Propaten Graft ,With Right Common Femoral Artery Endarterectomy.;  Surgeon: VSerafina Mitchell MD;  Location: [facility] OR;  Service: Vascular;  Laterality: Right;  . Debridment of decubitus ulcer N/A 05/18/2014    Procedure: EXCISION AND CLOSURE OF  DECUBITUS ULCER 5CM;  Surgeon: MPedro Earls MD;  Location: [facility] ORS;  Service: General;  Laterality: N/A;  . Back surgery    . Posterior lumbar fusion  X 1  . Abdominal aortagram N/A 02/21/2012    Procedure: ABDOMINAL AMaxcine Ham  Surgeon: VSerafina Mitchell MD;  [facility];  Service: Cardiovascular;  Laterality: N/A;  . Abdominal aortagram N/A 06/04/2012    Procedure: ABDOMINAL AORTAGRAM;  Surgeon: VSerafina Mitchell MD;  [facility];  Service: Cardiovascular;  Laterality: N/A;  . Biv icd genertaor change out N/A 10/22/2012    Procedure: BIV ICD GENERTAOR CHANGE OUT;  Surgeon: GEvans Lance MD;  [facility];  Service: Cardiovascular;  Laterality: N/A;  . Abdominal aortagram N/A 03/18/2013    Procedure: ABDOMINAL AMaxcine Ham  Surgeon: VSerafina Mitchell MD;  [facility];  Service: Cardiovascular;  Laterality: N/A;  . Abdominal aortagram N/A 07/02/2013    Procedure: ABDOMINAL AORTAGRAM;  Surgeon: VSerafina Mitchell MD;  [facility];  Service: Cardiovascular;  Laterality: N/A;  . Lower extremity angiogram Bilateral 07/02/2013    Procedure: LOWER EXTREMITY ANGIOGRAM;  Surgeon: VSerafina Mitchell MD;  [facility];  Service: Cardiovascular;  Laterality: Bilateral;  . Percutaneous stent intervention Right 07/02/2013    Procedure: PERCUTANEOUS STENT INTERVENTION;  Surgeon: VSerafina Mitchell MD;  [facility];  Service: Cardiovascular;  Laterality: Right;  rt ext iliac stent   Family History  Problem Relation Age of Onset  . Heart failure Father   . Diabetes Father   . Heart disease Father   . Hyperlipidemia Father   . Hypertension Father   . Heart disease Mother     before age 76 . Hyperlipidemia Mother   . Hypertension Mother    History  Substance Use Topics  . Smoking status: Former Smoker -- 3.00 packs/day for 48 years    Types: Cigarettes    Quit date: 07/03/2002  . Smokeless tobacco: Never Used  . Alcohol Use: Yes      Comment: 05/27/2014 "a beer q 6 months or so"    Review of Systems  Constitutional: Negative for fever.  Respiratory: Positive for shortness of breath. Negative for cough.   Cardiovascular: Positive for chest pain (resolved) and leg swelling.  Skin: Positive for wound (abrasion on left arm).      Allergies  Review of patient's allergies indicates no known allergies.  Home Medications   Prior to Admission medications   Medication Sig Start Date End Date Taking? Authorizing Provider  acetaminophen (TYLENOL) 325  MG tablet Take 2 tablets (650 mg total) by mouth every 4 (four) hours as needed for headache or mild pain. Patient not taking: Reported on 05/27/2014 08/09/13   Erlene Quan, PA-C  albuterol (PROVENTIL HFA;VENTOLIN HFA) 108 (90 BASE) MCG/ACT inhaler Inhale 2 puffs into the lungs every 6 (six) hours as needed for wheezing or shortness of breath.  10/02/12   Kathee Delton, MD  albuterol (PROVENTIL) (2.5 MG/3ML) 0.083% nebulizer solution Take 2.5 mg by nebulization 4 (four) times daily.     Historical Provider, MD  ALPRAZolam Duanne Moron) 0.25 MG tablet Take 1 tablet (0.25 mg total) by mouth 2 (two) times daily as needed for anxiety (sob). 06/09/14   Costin Karlyne Greenspan, MD  aspirin EC 325 MG tablet Take 325 mg by mouth at bedtime.    Historical Provider, MD  budesonide (PULMICORT) 0.5 MG/2ML nebulizer solution Take 0.5 mg by nebulization 2 (two) times daily.     Historical Provider, MD  collagenase (SANTYL) ointment Apply topically 2 (two) times daily. 06/02/14   Orson Eva, MD  digoxin (LANOXIN) 0.125 MG tablet Take 0.125 mg by mouth every morning.    Historical Provider, MD  diltiazem (CARDIZEM CD) 180 MG 24 hr capsule Take 1 capsule (180 mg total) by mouth daily. 06/09/14   Costin Karlyne Greenspan, MD  feeding supplement, ENSURE COMPLETE, (ENSURE COMPLETE) LIQD Take 237 mLs by mouth 2 (two) times daily between meals. 05/29/14   Orson Eva, MD  ferrous sulfate 325 (65 FE) MG tablet Take 325 mg by mouth  at bedtime.     Historical Provider, MD  furosemide (LASIX) 40 MG tablet TAKE 2 TABLETS IN THE MORNING AND 1 TABLET IN THE EVENING 04/23/14   Kathee Delton, MD  guaiFENesin (MUCINEX) 600 MG 12 hr tablet Take 600 mg by mouth 2 (two) times daily as needed. congestion    Historical Provider, MD  HYDROcodone-acetaminophen (VICODIN) 2.5-500 MG per tablet Take 1 tablet by mouth every 6 (six) hours as needed for pain.    Historical Provider, MD  irbesartan (AVAPRO) 150 MG tablet Take 1 tablet (150 mg total) by mouth daily. 06/09/14   Costin Karlyne Greenspan, MD  KLOR-CON M20 20 MEQ tablet TAKE 1 TABLET DAILY 02/17/14   Kathee Delton, MD  levofloxacin (LEVAQUIN) 500 MG tablet Take 1 tablet (500 mg total) by mouth daily. 06/09/14   Costin Karlyne Greenspan, MD  megestrol (MEGACE) 20 MG tablet Take 1 tablet (20 mg total) by mouth 2 (two) times daily. 06/09/14   Costin Karlyne Greenspan, MD  mupirocin ointment (BACTROBAN) 2 % Apply 1 application topically 2 (two) times daily as needed (Cuts and bruises).    Historical Provider, MD  nitroGLYCERIN (NITRODUR - DOSED IN MG/24 HR) 0.2 mg/hr patch Place 1 patch onto the skin daily.    Historical Provider, MD  nitroGLYCERIN (NITROSTAT) 0.4 MG SL tablet Place 0.4 mg under the tongue every 5 (five) minutes as needed for chest pain.    Historical Provider, MD  predniSONE (DELTASONE) 10 MG tablet Take 1 tablet (10 mg total) by mouth daily. Take 20 mg (2 tablets) for 4 days then return to 10 mg daily 06/09/14   Costin Karlyne Greenspan, MD  PRESCRIPTION MEDICATION Uses 1L-2L of home Oxygen    Historical Provider, MD  PROVENTIL HFA 108 (90 BASE) MCG/ACT inhaler USE 1 TO 2 INHALATIONS INTO THE LUNGS EVERY 6 HOURS AS NEEDED FOR SHORTNESS OF BREATH 06/24/14   Kathee Delton, MD  simvastatin (ZOCOR) 20  MG tablet Take 20 mg by mouth at bedtime.     Historical Provider, MD  tiotropium (SPIRIVA) 18 MCG inhalation capsule Place 18 mcg into inhaler and inhale daily.     Historical Provider, MD   BP 136/98 mmHg   Pulse 120  Temp(Src) 96.6 F (35.9 C) (Temporal)  Resp 22  Ht _0  (1.702 m)  Wt 139 lb (63.05 kg)  BMI 21.77 kg/m2 Physical Exam  Constitutional: He is oriented to person, place, and time. He appears well-developed and well-nourished.  HENT:  Head: Normocephalic.  Eyes: Conjunctivae are normal.  Neck: Normal range of motion. Neck supple.  Cardiovascular: Regular rhythm.  Tachycardia present.   Pulmonary/Chest: Effort normal. Tachypnea noted. He has rales (diffuse rales).  Musculoskeletal: Normal range of motion. He exhibits edema (bilateral pitting edema just above his ankles).  Neurological: He is alert and oriented to person, place, and time.  Skin: Skin is warm and dry.  Psychiatric: He has a normal mood and affect. His behavior is normal.  Nursing note and vitals reviewed.   ED Course  Procedures (including critical care time) DIAGNOSTIC STUDIES: Oxygen Saturation is 99% on oxygen, normal by my interpretation.    COORDINATION OF CARE: 11:14 PM Discussed treatment plan with patient at beside, the patient agrees with the plan and has no further questions at this time.   Labs Review Labs Reviewed  COMPREHENSIVE METABOLIC PANEL - Abnormal; Notable for the following:    Potassium 6.2 (*)    Glucose, Bld 304 (*)    BUN 39 (*)    Creatinine, Ser 1.64 (*)    Albumin 2.4 (*)    AST 52 (*)    GFR calc non Af Amer 39 (*)    GFR calc Af Amer 45 (*)    All other components within normal limits  BRAIN NATRIURETIC PEPTIDE - Abnormal; Notable for the following:    B Natriuretic Peptide 1188.9 (*)    All other components within normal limits  TROPONIN I - Abnormal; Notable for the following:    Troponin I 0.49 (*)    All other components within normal limits  CBC WITH DIFFERENTIAL - Abnormal; Notable for the following:    WBC 14.2 (*)    RBC 2.35 (*)    Hemoglobin 7.0 (*)    HCT 23.1 (*)    RDW 16.2 (*)    Neutro Abs 11.0 (*)    Monocytes Absolute 1.1 (*)    All other  components within normal limits  POC OCCULT BLOOD, ED - Abnormal; Notable for the following:    Fecal Occult Bld POSITIVE (*)    All other components within normal limits  URINALYSIS, ROUTINE W REFLEX MICROSCOPIC  POTASSIUM  BASIC METABOLIC PANEL  TYPE AND SCREEN  PREPARE RBC (CROSSMATCH)    Imaging Review Dg Chest Port 1 View  06/28/2014   CLINICAL DATA:  Constant short of breath. Chest pain. Leg swelling.  EXAM: PORTABLE CHEST - 1 VIEW  COMPARISON:  Radiograph 06/03/2014  FINDINGS: Left-sided pacemaker overlies stable enlarged cardiac silhouette. No effusion, infiltrate, pneumothorax.  IMPRESSION: Cardiomegaly without acute findings .   Electronically Signed   By: Suzy Bouchard M.D.   On: 06/28/2014 00:00     EKG Interpretation   Date/Time:  Saturday June 27 2014 23:08:44 EST Ventricular Rate:  119 PR Interval:    QRS Duration: 219 QT Interval:  433 QTC Calculation: 609 R Axis:   -76 Text Interpretation:  Sinus tachycardia RBBB and LAFB Consider  left  ventricular hypertrophy Baseline wander in lead(s) V3 V4 V5 V6 no  significant change from Jun 03 2014 Confirmed by Tameyah Koch  MD, Brynn Reznik [phone])  on 06/27/2014 11:51:39 PM      CRITICAL CARE Performed by: Sherwood Gambler T   Total critical care time: 45 minutes  Critical care time was exclusive of separately billable procedures and treating other patients.  Critical care was necessary to treat or prevent imminent or life-threatening deterioration.  Critical care was time spent personally by me on the following activities: development of treatment plan with patient and/or surrogate as well as nursing, discussions with consultants, evaluation of patient's response to treatment, examination of patient, obtaining history from patient or surrogate, ordering and performing treatments and interventions, ordering and review of laboratory studies, ordering and review of radiographic studies, pulse oximetry and re-evaluation of  patient's condition.   MDM   Final diagnoses:  Anemia, unspecified anemia type  SOB (shortness of breath)  Hyperkalemia  Acute kidney injury   Patient's shortness of breath is most likely from new anemia. He has dropped at least 2 points in the last couple weeks. He states he's had intermittent mild nosebleeds but is not recalling any GI bleeding. Does have occult blood on stool exam but no bright red blood. He does have borderline blood pressures, given small fluid boluses given that this does not seem to be fluid overload. He was also transfused both for blood pressure as well as symptomatic anemia. Found to have acute renal failure with mild hyperkalemia, given cardioprotection given his prior cardiac history, and will admit to the hospitalist to step down.  I personally performed the services described in this documentation, which was scribed in my presence. The recorded information has been reviewed and is accurate.     Ephraim Hamburger, MD 06/28/14 [phone]

## 2014-06-27 NOTE — ED Notes (Signed)
Patient from home with acute onset of respiratory distress.  Patient was found in the tripod position, with chest pain.  Patient had diminished breath sounds with rales in bases.  Patient is on home oxygen and placed on CPAP by EMS.  Patient does have nitro patch on (home med), hypertensive.

## 2014-06-27 NOTE — ED Notes (Signed)
Nitro patch taken off by Dr Regenia Skeeter.

## 2014-06-28 DIAGNOSIS — Z7952 Long term (current) use of systemic steroids: Secondary | ICD-10-CM | POA: Diagnosis not present

## 2014-06-28 DIAGNOSIS — J441 Chronic obstructive pulmonary disease with (acute) exacerbation: Secondary | ICD-10-CM | POA: Diagnosis present

## 2014-06-28 DIAGNOSIS — Z87891 Personal history of nicotine dependence: Secondary | ICD-10-CM | POA: Diagnosis not present

## 2014-06-28 DIAGNOSIS — R0602 Shortness of breath: Secondary | ICD-10-CM | POA: Diagnosis present

## 2014-06-28 DIAGNOSIS — D509 Iron deficiency anemia, unspecified: Secondary | ICD-10-CM | POA: Diagnosis not present

## 2014-06-28 DIAGNOSIS — Z9841 Cataract extraction status, right eye: Secondary | ICD-10-CM | POA: Diagnosis not present

## 2014-06-28 DIAGNOSIS — D649 Anemia, unspecified: Secondary | ICD-10-CM | POA: Insufficient documentation

## 2014-06-28 DIAGNOSIS — J439 Emphysema, unspecified: Secondary | ICD-10-CM

## 2014-06-28 DIAGNOSIS — Z95810 Presence of automatic (implantable) cardiac defibrillator: Secondary | ICD-10-CM | POA: Diagnosis not present

## 2014-06-28 DIAGNOSIS — Z961 Presence of intraocular lens: Secondary | ICD-10-CM | POA: Diagnosis present

## 2014-06-28 DIAGNOSIS — E46 Unspecified protein-calorie malnutrition: Secondary | ICD-10-CM | POA: Diagnosis present

## 2014-06-28 DIAGNOSIS — I4891 Unspecified atrial fibrillation: Secondary | ICD-10-CM | POA: Diagnosis present

## 2014-06-28 DIAGNOSIS — E785 Hyperlipidemia, unspecified: Secondary | ICD-10-CM | POA: Diagnosis present

## 2014-06-28 DIAGNOSIS — E43 Unspecified severe protein-calorie malnutrition: Secondary | ICD-10-CM | POA: Diagnosis not present

## 2014-06-28 DIAGNOSIS — E875 Hyperkalemia: Secondary | ICD-10-CM

## 2014-06-28 DIAGNOSIS — I5022 Chronic systolic (congestive) heart failure: Secondary | ICD-10-CM | POA: Diagnosis present

## 2014-06-28 DIAGNOSIS — N179 Acute kidney failure, unspecified: Secondary | ICD-10-CM

## 2014-06-28 DIAGNOSIS — I255 Ischemic cardiomyopathy: Secondary | ICD-10-CM | POA: Diagnosis present

## 2014-06-28 DIAGNOSIS — N319 Neuromuscular dysfunction of bladder, unspecified: Secondary | ICD-10-CM | POA: Diagnosis present

## 2014-06-28 DIAGNOSIS — K922 Gastrointestinal hemorrhage, unspecified: Secondary | ICD-10-CM | POA: Diagnosis present

## 2014-06-28 DIAGNOSIS — Z951 Presence of aortocoronary bypass graft: Secondary | ICD-10-CM | POA: Diagnosis not present

## 2014-06-28 DIAGNOSIS — I482 Chronic atrial fibrillation: Secondary | ICD-10-CM | POA: Diagnosis present

## 2014-06-28 DIAGNOSIS — L89154 Pressure ulcer of sacral region, stage 4: Secondary | ICD-10-CM | POA: Diagnosis not present

## 2014-06-28 DIAGNOSIS — Z79818 Long term (current) use of other agents affecting estrogen receptors and estrogen levels: Secondary | ICD-10-CM | POA: Diagnosis not present

## 2014-06-28 DIAGNOSIS — Z8673 Personal history of transient ischemic attack (TIA), and cerebral infarction without residual deficits: Secondary | ICD-10-CM | POA: Diagnosis not present

## 2014-06-28 DIAGNOSIS — L89159 Pressure ulcer of sacral region, unspecified stage: Secondary | ICD-10-CM | POA: Diagnosis present

## 2014-06-28 DIAGNOSIS — Z85038 Personal history of other malignant neoplasm of large intestine: Secondary | ICD-10-CM | POA: Diagnosis not present

## 2014-06-28 DIAGNOSIS — L89150 Pressure ulcer of sacral region, unstageable: Secondary | ICD-10-CM

## 2014-06-28 DIAGNOSIS — Z7982 Long term (current) use of aspirin: Secondary | ICD-10-CM | POA: Diagnosis not present

## 2014-06-28 DIAGNOSIS — E119 Type 2 diabetes mellitus without complications: Secondary | ICD-10-CM | POA: Diagnosis present

## 2014-06-28 DIAGNOSIS — J9610 Chronic respiratory failure, unspecified whether with hypoxia or hypercapnia: Secondary | ICD-10-CM | POA: Diagnosis present

## 2014-06-28 DIAGNOSIS — I340 Nonrheumatic mitral (valve) insufficiency: Secondary | ICD-10-CM | POA: Diagnosis present

## 2014-06-28 DIAGNOSIS — I472 Ventricular tachycardia: Secondary | ICD-10-CM | POA: Diagnosis present

## 2014-06-28 DIAGNOSIS — Z7951 Long term (current) use of inhaled steroids: Secondary | ICD-10-CM | POA: Diagnosis not present

## 2014-06-28 DIAGNOSIS — I480 Paroxysmal atrial fibrillation: Secondary | ICD-10-CM | POA: Diagnosis present

## 2014-06-28 DIAGNOSIS — I248 Other forms of acute ischemic heart disease: Secondary | ICD-10-CM | POA: Diagnosis present

## 2014-06-28 DIAGNOSIS — Z9981 Dependence on supplemental oxygen: Secondary | ICD-10-CM | POA: Diagnosis not present

## 2014-06-28 DIAGNOSIS — R000 Tachycardia, unspecified: Secondary | ICD-10-CM | POA: Diagnosis present

## 2014-06-28 DIAGNOSIS — G9340 Encephalopathy, unspecified: Secondary | ICD-10-CM | POA: Diagnosis not present

## 2014-06-28 DIAGNOSIS — I951 Orthostatic hypotension: Secondary | ICD-10-CM

## 2014-06-28 DIAGNOSIS — I471 Supraventricular tachycardia: Secondary | ICD-10-CM

## 2014-06-28 DIAGNOSIS — Z515 Encounter for palliative care: Secondary | ICD-10-CM | POA: Diagnosis not present

## 2014-06-28 DIAGNOSIS — J438 Other emphysema: Secondary | ICD-10-CM

## 2014-06-28 DIAGNOSIS — I2510 Atherosclerotic heart disease of native coronary artery without angina pectoris: Secondary | ICD-10-CM | POA: Diagnosis present

## 2014-06-28 DIAGNOSIS — Z9842 Cataract extraction status, left eye: Secondary | ICD-10-CM | POA: Diagnosis not present

## 2014-06-28 DIAGNOSIS — Z981 Arthrodesis status: Secondary | ICD-10-CM | POA: Diagnosis not present

## 2014-06-28 DIAGNOSIS — J9620 Acute and chronic respiratory failure, unspecified whether with hypoxia or hypercapnia: Secondary | ICD-10-CM | POA: Diagnosis not present

## 2014-06-28 DIAGNOSIS — K449 Diaphragmatic hernia without obstruction or gangrene: Secondary | ICD-10-CM | POA: Diagnosis present

## 2014-06-28 DIAGNOSIS — I82629 Acute embolism and thrombosis of deep veins of unspecified upper extremity: Secondary | ICD-10-CM | POA: Diagnosis present

## 2014-06-28 DIAGNOSIS — R627 Adult failure to thrive: Secondary | ICD-10-CM | POA: Diagnosis present

## 2014-06-28 DIAGNOSIS — J449 Chronic obstructive pulmonary disease, unspecified: Secondary | ICD-10-CM | POA: Diagnosis present

## 2014-06-28 DIAGNOSIS — I959 Hypotension, unspecified: Secondary | ICD-10-CM | POA: Diagnosis present

## 2014-06-28 DIAGNOSIS — I5023 Acute on chronic systolic (congestive) heart failure: Secondary | ICD-10-CM | POA: Diagnosis present

## 2014-06-28 DIAGNOSIS — I481 Persistent atrial fibrillation: Secondary | ICD-10-CM | POA: Diagnosis present

## 2014-06-28 DIAGNOSIS — I10 Essential (primary) hypertension: Secondary | ICD-10-CM | POA: Diagnosis present

## 2014-06-28 LAB — BASIC METABOLIC PANEL
Anion gap: 8 (ref 5–15)
Anion gap: 4 — ABNORMAL LOW (ref 5–15)
Anion gap: 6 (ref 5–15)
BUN: 41 mg/dL — AB (ref 6–23)
BUN: 40 mg/dL — AB (ref 6–23)
BUN: 33 mg/dL — ABNORMAL HIGH (ref 6–23)
CHLORIDE: 99 meq/L (ref 96–112)
CHLORIDE: 99 meq/L (ref 96–112)
CHLORIDE: 98 meq/L (ref 96–112)
CO2: 29 mmol/L (ref 19–32)
CO2: 31 mmol/L (ref 19–32)
CO2: 28 mmol/L (ref 19–32)
CREATININE: 1.45 mg/dL — AB (ref 0.50–1.35)
Calcium: 8.8 mg/dL (ref 8.4–10.5)
Calcium: 8.5 mg/dL (ref 8.4–10.5)
Calcium: 8.1 mg/dL — ABNORMAL LOW (ref 8.4–10.5)
Creatinine, Ser: 1.61 mg/dL — ABNORMAL HIGH (ref 0.50–1.35)
Creatinine, Ser: 1.13 mg/dL (ref 0.50–1.35)
GFR calc Af Amer: 46 mL/min — ABNORMAL LOW (ref >90)
GFR calc Af Amer: 52 mL/min — ABNORMAL LOW (ref >90)
GFR calc Af Amer: 71 mL/min — ABNORMAL LOW (ref >90)
GFR calc non Af Amer: 40 mL/min — ABNORMAL LOW (ref >90)
GFR calc non Af Amer: 45 mL/min — ABNORMAL LOW (ref >90)
GFR calc non Af Amer: 61 mL/min — ABNORMAL LOW (ref >90)
GLUCOSE: 282 mg/dL — AB (ref 70–99)
GLUCOSE: 158 mg/dL — AB (ref 70–99)
GLUCOSE: 184 mg/dL — AB (ref 70–99)
POTASSIUM: 5.9 mmol/L — AB (ref 3.5–5.1)
POTASSIUM: 5.4 mmol/L — AB (ref 3.5–5.1)
POTASSIUM: 4.9 mmol/L (ref 3.5–5.1)
Sodium: 136 mmol/L (ref 135–145)
Sodium: 134 mmol/L — ABNORMAL LOW (ref 135–145)
Sodium: 132 mmol/L — ABNORMAL LOW (ref 135–145)

## 2014-06-28 LAB — TROPONIN I: Troponin I: 0.49 ng/mL — ABNORMAL HIGH (ref <0.031)

## 2014-06-28 LAB — CBC WITH DIFFERENTIAL/PLATELET
BASOS PCT: 0 % (ref 0–1)
Basophils Absolute: 0 10*3/uL (ref 0.0–0.1)
EOS ABS: 0.1 10*3/uL (ref 0.0–0.7)
EOS PCT: 1 % (ref 0–5)
HEMATOCRIT: 23.1 % — AB (ref 39.0–52.0)
Hemoglobin: 7 g/dL — ABNORMAL LOW (ref 13.0–17.0)
LYMPHS PCT: 14 % (ref 12–46)
Lymphs Abs: 2 10*3/uL (ref 0.7–4.0)
MCH: 29.8 pg (ref 26.0–34.0)
MCHC: 30.3 g/dL (ref 30.0–36.0)
MCV: 98.3 fL (ref 78.0–100.0)
Monocytes Absolute: 1.1 10*3/uL — ABNORMAL HIGH (ref 0.1–1.0)
Monocytes Relative: 8 % (ref 3–12)
NEUTROS PCT: 77 % (ref 43–77)
Neutro Abs: 11 10*3/uL — ABNORMAL HIGH (ref 1.7–7.7)
Platelets: 312 10*3/uL (ref 150–400)
RBC: 2.35 MIL/uL — ABNORMAL LOW (ref 4.22–5.81)
RDW: 16.2 % — ABNORMAL HIGH (ref 11.5–15.5)
WBC: 14.2 10*3/uL — AB (ref 4.0–10.5)

## 2014-06-28 LAB — COMPREHENSIVE METABOLIC PANEL
ALT: 34 U/L (ref 0–53)
ANION GAP: 15 (ref 5–15)
AST: 52 U/L — ABNORMAL HIGH (ref 0–37)
Albumin: 2.4 g/dL — ABNORMAL LOW (ref 3.5–5.2)
Alkaline Phosphatase: 79 U/L (ref 39–117)
BILIRUBIN TOTAL: 0.4 mg/dL (ref 0.3–1.2)
BUN: 39 mg/dL — AB (ref 6–23)
CHLORIDE: 98 meq/L (ref 96–112)
CO2: 24 mmol/L (ref 19–32)
Calcium: 8.8 mg/dL (ref 8.4–10.5)
Creatinine, Ser: 1.64 mg/dL — ABNORMAL HIGH (ref 0.50–1.35)
GFR, EST AFRICAN AMERICAN: 45 mL/min — AB (ref >90)
GFR, EST NON AFRICAN AMERICAN: 39 mL/min — AB (ref >90)
Glucose, Bld: 304 mg/dL — ABNORMAL HIGH (ref 70–99)
POTASSIUM: 6.2 mmol/L — AB (ref 3.5–5.1)
SODIUM: 137 mmol/L (ref 135–145)
TOTAL PROTEIN: 6.2 g/dL (ref 6.0–8.3)

## 2014-06-28 LAB — URINALYSIS, ROUTINE W REFLEX MICROSCOPIC
GLUCOSE, UA: NEGATIVE mg/dL
Hgb urine dipstick: NEGATIVE
Ketones, ur: 15 mg/dL — AB
Nitrite: NEGATIVE
Protein, ur: 100 mg/dL — AB
Specific Gravity, Urine: 1.026 (ref 1.005–1.030)
Urobilinogen, UA: 1 mg/dL (ref 0.0–1.0)
pH: 5 (ref 5.0–8.0)

## 2014-06-28 LAB — BRAIN NATRIURETIC PEPTIDE: B Natriuretic Peptide: 1188.9 pg/mL — ABNORMAL HIGH (ref 0.0–100.0)

## 2014-06-28 LAB — HEMOGLOBIN A1C
Hgb A1c MFr Bld: 7.9 % — ABNORMAL HIGH (ref <5.7)
Mean Plasma Glucose: 180 mg/dL — ABNORMAL HIGH (ref <117)

## 2014-06-28 LAB — GLUCOSE, CAPILLARY
GLUCOSE-CAPILLARY: 164 mg/dL — AB (ref 70–99)
Glucose-Capillary: 141 mg/dL — ABNORMAL HIGH (ref 70–99)
Glucose-Capillary: 182 mg/dL — ABNORMAL HIGH (ref 70–99)
Glucose-Capillary: 179 mg/dL — ABNORMAL HIGH (ref 70–99)

## 2014-06-28 LAB — HEMOGLOBIN AND HEMATOCRIT, BLOOD
HCT: 25.9 % — ABNORMAL LOW (ref 39.0–52.0)
HEMOGLOBIN: 8.1 g/dL — AB (ref 13.0–17.0)

## 2014-06-28 LAB — URINE MICROSCOPIC-ADD ON

## 2014-06-28 LAB — CBC
HCT: 25.5 % — ABNORMAL LOW (ref 39.0–52.0)
HEMOGLOBIN: 8.2 g/dL — AB (ref 13.0–17.0)
MCH: 30.4 pg (ref 26.0–34.0)
MCHC: 32.2 g/dL (ref 30.0–36.0)
MCV: 94.4 fL (ref 78.0–100.0)
Platelets: 210 10*3/uL (ref 150–400)
RBC: 2.7 MIL/uL — AB (ref 4.22–5.81)
RDW: 16.5 % — ABNORMAL HIGH (ref 11.5–15.5)
WBC: 8.3 10*3/uL (ref 4.0–10.5)

## 2014-06-28 LAB — PREPARE RBC (CROSSMATCH)

## 2014-06-28 LAB — MRSA PCR SCREENING: MRSA by PCR: POSITIVE — AB

## 2014-06-28 LAB — POC OCCULT BLOOD, ED: FECAL OCCULT BLD: POSITIVE — AB

## 2014-06-28 MED ORDER — ONDANSETRON HCL 4 MG/2ML IJ SOLN
4.0000 mg | Freq: Four times a day (QID) | INTRAMUSCULAR | Status: DC | PRN
  Administered 2014-07-06: 4 mg via INTRAVENOUS
  Filled 2014-06-28: qty 2

## 2014-06-28 MED ORDER — HYDROCODONE-ACETAMINOPHEN 5-325 MG PO TABS
1.0000 | ORAL_TABLET | Freq: Four times a day (QID) | ORAL | Status: DC | PRN
  Administered 2014-06-28 – 2014-07-11 (×27): 1 via ORAL
  Filled 2014-06-28 (×27): qty 1

## 2014-06-28 MED ORDER — SODIUM CHLORIDE 0.9 % IV SOLN: INTRAVENOUS | Status: DC

## 2014-06-28 MED ORDER — SODIUM CHLORIDE 0.9 % IV SOLN
Freq: Once | INTRAVENOUS | Status: AC
  Administered 2014-06-28: 06:00:00 via INTRAVENOUS

## 2014-06-28 MED ORDER — FUROSEMIDE 10 MG/ML IJ SOLN
20.0000 mg | Freq: Once | INTRAMUSCULAR | Status: AC
  Administered 2014-06-28: 20 mg via INTRAVENOUS
  Filled 2014-06-28: qty 2

## 2014-06-28 MED ORDER — HYDROMORPHONE HCL 1 MG/ML IJ SOLN
0.5000 mg | INTRAMUSCULAR | Status: DC | PRN
  Administered 2014-07-01 – 2014-07-03 (×3): 1 mg via INTRAVENOUS
  Administered 2014-07-03: 0.5 mg via INTRAVENOUS
  Administered 2014-07-03 – 2014-07-16 (×19): 1 mg via INTRAVENOUS
  Filled 2014-06-28 (×26): qty 1

## 2014-06-28 MED ORDER — ALUM & MAG HYDROXIDE-SIMETH 200-200-20 MG/5ML PO SUSP: 30.0000 mL | Freq: Four times a day (QID) | ORAL | Status: DC | PRN

## 2014-06-28 MED ORDER — DEXTROSE 50 % IV SOLN
50.0000 mL | Freq: Once | INTRAVENOUS | Status: AC
  Administered 2014-06-28: 50 mL via INTRAVENOUS
  Filled 2014-06-28: qty 50

## 2014-06-28 MED ORDER — OXYCODONE HCL 5 MG PO TABS
5.0000 mg | ORAL_TABLET | ORAL | Status: DC | PRN
  Administered 2014-06-30 – 2014-07-11 (×8): 5 mg via ORAL
  Filled 2014-06-28 (×8): qty 1

## 2014-06-28 MED ORDER — PREDNISONE 10 MG PO TABS
10.0000 mg | ORAL_TABLET | Freq: Every day | ORAL | Status: DC
  Administered 2014-06-28 – 2014-07-17 (×20): 10 mg via ORAL
  Filled 2014-06-28 (×21): qty 1

## 2014-06-28 MED ORDER — DIGOXIN 125 MCG PO TABS
0.1250 mg | ORAL_TABLET | Freq: Every morning | ORAL | Status: DC
  Administered 2014-06-28: 0.125 mg via ORAL
  Filled 2014-06-28: qty 1

## 2014-06-28 MED ORDER — SODIUM CHLORIDE 0.9 % IV BOLUS (SEPSIS)
250.0000 mL | Freq: Once | INTRAVENOUS | Status: AC
  Administered 2014-06-28: 250 mL via INTRAVENOUS

## 2014-06-28 MED ORDER — INSULIN ASPART 100 UNIT/ML IV SOLN
5.0000 [IU] | Freq: Once | INTRAVENOUS | Status: AC
  Administered 2014-06-28: 5 [IU] via INTRAVENOUS
  Filled 2014-06-28: qty 1

## 2014-06-28 MED ORDER — BUDESONIDE 0.5 MG/2ML IN SUSP
0.5000 mg | Freq: Two times a day (BID) | RESPIRATORY_TRACT | Status: DC
  Administered 2014-06-28 – 2014-07-17 (×38): 0.5 mg via RESPIRATORY_TRACT
  Filled 2014-06-28 (×41): qty 2

## 2014-06-28 MED ORDER — DILTIAZEM HCL 30 MG PO TABS
30.0000 mg | ORAL_TABLET | Freq: Three times a day (TID) | ORAL | Status: DC
  Administered 2014-06-28 – 2014-06-30 (×7): 30 mg via ORAL
  Filled 2014-06-28 (×7): qty 1

## 2014-06-28 MED ORDER — SODIUM CHLORIDE 0.9 % IV SOLN
1.0000 g | Freq: Once | INTRAVENOUS | Status: AC
  Administered 2014-06-28: 1 g via INTRAVENOUS
  Filled 2014-06-28: qty 10

## 2014-06-28 MED ORDER — ACETAMINOPHEN 650 MG RE SUPP: 650.0000 mg | Freq: Four times a day (QID) | RECTAL | Status: DC | PRN

## 2014-06-28 MED ORDER — ONDANSETRON HCL 4 MG PO TABS
4.0000 mg | ORAL_TABLET | Freq: Four times a day (QID) | ORAL | Status: DC | PRN
  Administered 2014-07-05: 4 mg via ORAL
  Filled 2014-06-28: qty 1

## 2014-06-28 MED ORDER — SODIUM CHLORIDE 0.9 % IV SOLN
Freq: Once | INTRAVENOUS | Status: AC
  Administered 2014-06-28: 03:00:00 via INTRAVENOUS

## 2014-06-28 MED ORDER — SODIUM CHLORIDE 0.9 % IV SOLN
Freq: Once | INTRAVENOUS | Status: AC
  Administered 2014-06-28: 01:00:00 via INTRAVENOUS

## 2014-06-28 MED ORDER — LEVALBUTEROL HCL 0.63 MG/3ML IN NEBU
0.6300 mg | INHALATION_SOLUTION | Freq: Three times a day (TID) | RESPIRATORY_TRACT | Status: DC | PRN
  Administered 2014-06-28 – 2014-06-30 (×2): 0.63 mg via RESPIRATORY_TRACT

## 2014-06-28 MED ORDER — SODIUM CHLORIDE 0.9 % IV SOLN
INTRAVENOUS | Status: DC
  Administered 2014-06-28: 22:00:00 via INTRAVENOUS

## 2014-06-28 MED ORDER — PANTOPRAZOLE SODIUM 40 MG IV SOLR
40.0000 mg | Freq: Two times a day (BID) | INTRAVENOUS | Status: DC
  Administered 2014-06-28 – 2014-06-30 (×7): 40 mg via INTRAVENOUS
  Filled 2014-06-28 (×7): qty 40

## 2014-06-28 MED ORDER — TIOTROPIUM BROMIDE MONOHYDRATE 18 MCG IN CAPS
18.0000 ug | ORAL_CAPSULE | Freq: Every day | RESPIRATORY_TRACT | Status: DC
  Administered 2014-06-30 – 2014-07-17 (×16): 18 ug via RESPIRATORY_TRACT
  Filled 2014-06-28 (×5): qty 5

## 2014-06-28 MED ORDER — ACETAMINOPHEN 325 MG PO TABS: 650.0000 mg | ORAL_TABLET | Freq: Four times a day (QID) | ORAL | Status: DC | PRN

## 2014-06-28 MED ORDER — ALPRAZOLAM 0.25 MG PO TABS
0.2500 mg | ORAL_TABLET | Freq: Two times a day (BID) | ORAL | Status: DC | PRN
  Administered 2014-06-28 – 2014-07-12 (×28): 0.25 mg via ORAL
  Filled 2014-06-28 (×28): qty 1

## 2014-06-28 MED ORDER — DIGOXIN 0.25 MG/ML IJ SOLN
0.1250 mg | Freq: Once | INTRAMUSCULAR | Status: AC
  Administered 2014-06-28: 0.125 mg via INTRAVENOUS
  Filled 2014-06-28: qty 0.5

## 2014-06-28 MED ORDER — INSULIN ASPART 100 UNIT/ML ~~LOC~~ SOLN
0.0000 [IU] | SUBCUTANEOUS | Status: DC
  Administered 2014-06-28 (×2): 2 [IU] via SUBCUTANEOUS
  Administered 2014-06-29 (×3): 1 [IU] via SUBCUTANEOUS
  Administered 2014-06-30: 6 [IU] via SUBCUTANEOUS
  Administered 2014-06-30: 2 [IU] via SUBCUTANEOUS
  Administered 2014-06-30 – 2014-07-01 (×3): 3 [IU] via SUBCUTANEOUS
  Administered 2014-07-02: 1 [IU] via SUBCUTANEOUS
  Administered 2014-07-02 (×2): 2 [IU] via SUBCUTANEOUS
  Administered 2014-07-02: 1 [IU] via SUBCUTANEOUS
  Administered 2014-07-02 – 2014-07-03 (×2): 2 [IU] via SUBCUTANEOUS
  Administered 2014-07-03: 3 [IU] via SUBCUTANEOUS
  Administered 2014-07-04 (×3): 2 [IU] via SUBCUTANEOUS
  Administered 2014-07-04: 1 [IU] via SUBCUTANEOUS
  Administered 2014-07-04: 3 [IU] via SUBCUTANEOUS
  Administered 2014-07-04 – 2014-07-05 (×2): 5 [IU] via SUBCUTANEOUS
  Administered 2014-07-05 (×2): 1 [IU] via SUBCUTANEOUS
  Administered 2014-07-05 – 2014-07-06 (×2): 3 [IU] via SUBCUTANEOUS

## 2014-06-28 MED ORDER — METOPROLOL TARTRATE 1 MG/ML IV SOLN
2.5000 mg | Freq: Once | INTRAVENOUS | Status: AC
  Administered 2014-06-28: 2.5 mg via INTRAVENOUS
  Filled 2014-06-28: qty 5

## 2014-06-28 MED ORDER — SODIUM POLYSTYRENE SULFONATE 15 GM/60ML PO SUSP
15.0000 g | Freq: Once | ORAL | Status: DC
  Filled 2014-06-28: qty 60

## 2014-06-28 MED ORDER — GUAIFENESIN ER 600 MG PO TB12
600.0000 mg | ORAL_TABLET | Freq: Two times a day (BID) | ORAL | Status: DC
  Administered 2014-06-28 – 2014-07-17 (×38): 600 mg via ORAL
  Filled 2014-06-28 (×40): qty 1

## 2014-06-28 NOTE — H&P (Addendum)
[facility] Admission History and Physical       [name] JOA:[id number] DOB: 22-Apr-1936 DOA: 06/27/2014  Referring physician: EDP PCP: Dorian Heckle, MD  Specialists:   Chief Complaint: SOB and Chest Pain  HPI: [name] is a 78 y.o. male with a history of CAD, Ischemic Cardiomyopathy, Systolic CHF,  COPD, Chronic Respiratory Failure , HTN , Hyperlipidemia, DM2,  Remote history of Colon Cancer who presents to the ED with complaints of  SOB and Chest pain that started at 9 pm.  His SOB worsened and lasted for 15 minutes after he had arrived in the ED and was placed on  BIPAP.  He improved and the BIPAP was discontinued, and his chest X-ray was reported as non acute.  As for his labs, he was found to have a hemoglobin level which had decreased to 7.0 from and he reports passing black stools and an FOBT was found to be HEME positive, but he is also on iron therapy.  His BUN/Cr were elevated and his potassium level was elevated at a level of 6.2.      Review of Systems:  Constitutional: No Weight Loss, No Weight Gain, Night Sweats, Fevers, Chills, Dizziness, Fatigue, or Generalized Weakness HEENT: No Headaches, Difficulty Swallowing,Tooth/Dental Problems,Sore Throat,  No Sneezing, Rhinitis, Ear Ache, Nasal Congestion, or Post Nasal Drip,  Cardio-vascular:  +Chest pain, Orthopnea, PND, Edema in Lower Extremities, Anasarca, Dizziness, Palpitations  Resp: +Dyspnea, No DOE, No Productive Cough, No Non-Productive Cough, No Hemoptysis, No Wheezing.    GI: No Heartburn, Indigestion, Abdominal Pain, Nausea, Vomiting, Diarrhea, Hematemesis, Hematochezia, +Melena, Change in Bowel Habits,  Loss of Appetite  GU: No Dysuria, Change in Color of Urine, No Urgency or Frequency, No Flank pain.  Musculoskeletal: No Joint Pain or Swelling, No Decreased Range of Motion, No Back Pain.  Neurologic: No Syncope, No Seizures, Muscle Weakness, Paresthesia, Vision Disturbance or Loss, No Diplopia,  No Vertigo, No Difficulty Walking,  Skin: No Rash or Lesions. Psych: No Change in Mood or Affect, No Depression or Anxiety, No Memory loss, No Confusion, or Hallucinations   Past Medical History  Diagnosis Date  . Hypokalemia   . PVD (peripheral vascular disease)   . HTN (hypertension)   . CHF (congestive heart failure)   . Ventricular tachycardia   . Ischemic heart disease, chronic   . Dyslipidemia   . CAD (coronary artery disease)   . On home oxygen therapy     "~ 2.5L; pretty much 24/7" (05/27/2014)  . Pacemaker   . Self-catheterizes urinary bladder     since back surgery.  6- 7 times a day. (05/27/2014)  . Difficult intubation     pt states "not aware of difficult intubation, never told"  . Heart murmur     dx 1974  . Shortness of breath   . Head injury, closed, with brief LOC 2013  . Neurogenic bladder     since back surgery - self caths  . Neurogenic bowel     digitally stimulates daily.  Marland Kitchen COPD (chronic obstructive pulmonary disease)      PT USES OXYGEN 24 HRS A DAY - 2 L NASAL CANNULA  . Emphysema   . Anemia     SERUM MONOCLONAL IgM KAPPA PROTEIN - NO CLINICAL EVIDENCE FOR PROGRESSION TO MULTIPLE MYELOMA OR OTHER PROLIFERATIVE DISORDER - PER OFFICE NOTE DR. SHERRILL DATED 12/30/13  . Pneumonia X 2  . Borderline type 2 diabetes mellitus   . AICD (automatic cardioverter/defibrillator) present   .  TIA (transient ischemic attack) 1980's    "they say I did; I don't believe it"  . Colon cancer   . MRSA infection       Past Surgical History  Procedure Laterality Date  . Lumbar disc surgery  X 2  . Iliac artery stent Left   . Bi-ventricular implantable cardioverter defibrillator  (crt-d)  04-10-08    implantation of Medtronic Concerto Bi-V ICD [; Cristopher Peru MD  . Colonoscopy    . Polypectomy    . Insertion of iliac stent Right 06/04/2012    external   . Cardiac catheterization    . Insert / replace / remove pacemaker    . Endarterectomy femoral  06/28/2012      Procedure: ENDARTERECTOMY FEMORAL;  Surgeon: Serafina Mitchell, MD;  Location: [place];  Service: Vascular;  Laterality: Left;  . Patch angioplasty  06/28/2012    Procedure: PATCH ANGIOPLASTY;  Surgeon: Serafina Mitchell, MD;  Location: [facility] OR;  Service: Vascular;  Laterality: Left;  Using 0.8 cm x 15.2 cm Hemashield Patch.  . Colon surgery  03/2004  . Cataract extraction w/ intraocular lens  implant, bilateral Bilateral   . Cholecystectomy    . Coronary artery bypass graft  1998    x3 vessels  . Femoral-popliteal bypass graft Right 03/28/2013    Procedure: BYPASS GRAFT FEMORAL-BELOW KNEE POPLITEAL ARTERY Using 47mm x 80 cm Propaten Graft ,With Right Common Femoral Artery Endarterectomy.;  Surgeon: Serafina Mitchell, MD;  Location: [facility] OR;  Service: Vascular;  Laterality: Right;  . Debridment of decubitus ulcer N/A 05/18/2014    Procedure: EXCISION AND CLOSURE OF DECUBITUS ULCER 5CM;  Surgeon: Pedro Earls, MD;  Location: [facility] ORS;  Service: General;  Laterality: N/A;  . Back surgery    . Posterior lumbar fusion  X 1  . Abdominal aortagram N/A 02/21/2012    Procedure: ABDOMINAL Maxcine Ham;  Surgeon: Serafina Mitchell, MD;  Location: [facility] CATH LAB;  Service: Cardiovascular;  Laterality: N/A;  . Abdominal aortagram N/A 06/04/2012    Procedure: ABDOMINAL AORTAGRAM;  Surgeon: Serafina Mitchell, MD;  Location: [facility] CATH LAB;  Service: Cardiovascular;  Laterality: N/A;  . Biv icd genertaor change out N/A 10/22/2012    Procedure: BIV ICD GENERTAOR CHANGE OUT;  Surgeon: Evans Lance, MD;  Location: [facility] CATH LAB;  Service: Cardiovascular;  Laterality: N/A;  . Abdominal aortagram N/A 03/18/2013    Procedure: ABDOMINAL Maxcine Ham;  Surgeon: Serafina Mitchell, MD;  Location: [facility] CATH LAB;  Service: Cardiovascular;  Laterality: N/A;  . Abdominal aortagram N/A 07/02/2013    Procedure: ABDOMINAL AORTAGRAM;  Surgeon: Serafina Mitchell, MD;  Location: [facility] CATH LAB;  Service: Cardiovascular;  Laterality: N/A;  . Lower extremity  angiogram Bilateral 07/02/2013    Procedure: LOWER EXTREMITY ANGIOGRAM;  Surgeon: Serafina Mitchell, MD;  Location: [facility] CATH LAB;  Service: Cardiovascular;  Laterality: Bilateral;  . Percutaneous stent intervention Right 07/02/2013    Procedure: PERCUTANEOUS STENT INTERVENTION;  Surgeon: Serafina Mitchell, MD;  Location: [facility] CATH LAB;  Service: Cardiovascular;  Laterality: Right;  rt ext iliac stent       Prior to Admission medications   Medication Sig Start Date End Date Taking? Authorizing Provider  acetaminophen (TYLENOL) 325 MG tablet Take 2 tablets (650 mg total) by mouth every 4 (four) hours as needed for headache or mild pain. 08/09/13  Yes Luke K Kilroy, PA-C  albuterol (PROVENTIL HFA;VENTOLIN HFA) 108 (90 BASE) MCG/ACT inhaler Inhale 2 puffs into the  lungs every 6 (six) hours as needed for wheezing or shortness of breath.  10/02/12  Yes Kathee Delton, MD  albuterol (PROVENTIL) (2.5 MG/3ML) 0.083% nebulizer solution Take 2.5 mg by nebulization 4 (four) times daily.    Yes Historical Provider, MD  ALPRAZolam (XANAX) 0.25 MG tablet Take 1 tablet (0.25 mg total) by mouth 2 (two) times daily as needed for anxiety (sob). 06/09/14  Yes Costin Karlyne Greenspan, MD  aspirin EC 325 MG tablet Take 325 mg by mouth daily.   Yes Historical Provider, MD  budesonide (PULMICORT) 0.5 MG/2ML nebulizer solution Take 0.5 mg by nebulization 2 (two) times daily.    Yes Historical Provider, MD  candesartan (ATACAND) 32 MG tablet Take 32 mg by mouth daily.   Yes Historical Provider, MD  digoxin (LANOXIN) 0.125 MG tablet Take 0.125 mg by mouth every morning.   Yes Historical Provider, MD  diltiazem (CARDIZEM CD) 180 MG 24 hr capsule Take 1 capsule (180 mg total) by mouth daily. 06/09/14  Yes Costin Karlyne Greenspan, MD  ferrous sulfate 325 (65 FE) MG tablet Take 325 mg by mouth at bedtime.    Yes Historical Provider, MD  furosemide (LASIX) 40 MG tablet TAKE 2 TABLETS IN THE MORNING AND 1 TABLET IN THE EVENING 04/23/14  Yes Kathee Delton, MD  GLUCERNA (GLUCERNA) LIQD Take 237 mLs by mouth 3 (three) times daily between meals.   Yes Historical Provider, MD  guaiFENesin (MUCINEX) 600 MG 12 hr tablet Take 600 mg by mouth 2 (two) times daily. congestion   Yes Historical Provider, MD  Hydrocodone-Acetaminophen 5-300 MG TABS Take 1 tablet by mouth every 6 (six) hours as needed (pain).   Yes Historical Provider, MD  irbesartan (AVAPRO) 150 MG tablet Take 1 tablet (150 mg total) by mouth daily. 06/09/14  Yes Costin Karlyne Greenspan, MD  KLOR-CON M20 20 MEQ tablet TAKE 1 TABLET DAILY 02/17/14  Yes Kathee Delton, MD  megestrol (MEGACE) 20 MG tablet Take 1 tablet (20 mg total) by mouth 2 (two) times daily. 06/09/14  Yes Costin Karlyne Greenspan, MD  mupirocin ointment (BACTROBAN) 2 % Apply 1 application topically 2 (two) times daily as needed (Cuts and bruises).   Yes Historical Provider, MD  nitroGLYCERIN (NITRODUR - DOSED IN MG/24 HR) 0.2 mg/hr patch Place 1 patch onto the skin daily.   Yes Historical Provider, MD  nitroGLYCERIN (NITROSTAT) 0.4 MG SL tablet Place 0.4 mg under the tongue every 5 (five) minutes as needed for chest pain.   Yes Historical Provider, MD  predniSONE (DELTASONE) 10 MG tablet Take 1 tablet (10 mg total) by mouth daily. Take 20 mg (2 tablets) for 4 days then return to 10 mg daily Patient taking differently: Take 10 mg by mouth daily.  06/09/14  Yes Costin Karlyne Greenspan, MD  PRESCRIPTION MEDICATION Uses 1L-2L of home Oxygen   Yes Historical Provider, MD  simvastatin (ZOCOR) 20 MG tablet Take 20 mg by mouth at bedtime.    Yes Historical Provider, MD  tiotropium (SPIRIVA) 18 MCG inhalation capsule Place 18 mcg into inhaler and inhale daily.    Yes Historical Provider, MD  aspirin EC 325 MG tablet Take 325 mg by mouth at bedtime.    Historical Provider, MD  collagenase (SANTYL) ointment Apply topically 2 (two) times daily. Patient not taking: Reported on 06/28/2014 06/02/14   Orson Eva, MD  feeding supplement, ENSURE COMPLETE, (ENSURE  COMPLETE) LIQD Take 237 mLs by mouth 2 (two) times daily between meals. Patient not  taking: Reported on 06/28/2014 05/29/14   Orson Eva, MD  HYDROcodone-acetaminophen (VICODIN) 2.5-500 MG per tablet Take 1 tablet by mouth every 6 (six) hours as needed for pain.    Historical Provider, MD  levofloxacin (LEVAQUIN) 500 MG tablet Take 1 tablet (500 mg total) by mouth daily. Patient not taking: Reported on 06/28/2014 06/09/14   Caren Griffins, MD  PROVENTIL HFA 108 (90 BASE) MCG/ACT inhaler USE 1 TO 2 INHALATIONS INTO THE LUNGS EVERY 6 HOURS AS NEEDED FOR SHORTNESS OF BREATH Patient not taking: Reported on 06/28/2014 06/24/14   Kathee Delton, MD      No Known Allergies   Social History:  reports that he quit smoking about 11 years ago. His smoking use included Cigarettes. He has a 144 pack-year smoking history. He has never used smokeless tobacco. He reports that he drinks alcohol. He reports that he does not use illicit drugs.     Family History  Problem Relation Age of Onset  . Heart failure Father   . Diabetes Father   . Heart disease Father   . Hyperlipidemia Father   . Hypertension Father   . Heart disease Mother     before age 52  . Hyperlipidemia Mother   . Hypertension Mother        Physical Exam:  GEN:  Pleasant Well Developed Elderly  78 y.o. Caucasian male examined and in no acute distress; cooperative with exam Filed Vitals:   06/28/14 0015 06/28/14 0145 06/28/14 0200 06/28/14 0215  BP: $Re'90/45 98/54 83/54 'kQE$ 89/50  Pulse: 116 124 127 108  Temp:      TempSrc:      Resp: $Remo'20 19 20 18  'clnVb$ Height:      Weight:      SpO2: 100% 100% 100% 100%   Blood pressure 89/50, pulse 108, temperature 96.6 F (35.9 C), temperature source Temporal, resp. rate 18, height $RemoveBe'5\' 7"'qJwQUWqKk$  (1.702 m), weight 63.05 kg (139 lb), SpO2 100 %. PSYCH: He is alert and oriented x4; does not appear anxious does not appear depressed; affect is normal HEENT: Normocephalic and Atraumatic, Mucous membranes pink;  PERRLA; EOM intact; Fundi:  Benign;  No scleral icterus, Nares: Patent, Oropharynx: Clear, Fair Dentition,    Neck:  FROM, No Cervical Lymphadenopathy nor Thyromegaly or Carotid Bruit; No JVD; Breasts:: Not examined CHEST WALL: No tenderness CHEST: Normal respiration, clear to auscultation bilaterally HEART: Regular rate and rhythm; no murmurs rubs or gallops BACK: No kyphosis or scoliosis; No CVA tenderness ABDOMEN: Positive Bowel Sounds, Obese, Soft Non-Tender; No Masses, No Organomegaly,  Rectal Exam: FOBT: HEME++   Sacral Decubitus Wound Unstageable    "Baseball size" Stage III to Stage IV EXTREMITIES: No Cyanosis, Clubbing, 3+ Pedal Edema; No Ulcerations. Genitalia: not examined PULSES: 2+ and symmetric SKIN: Normal hydration no rash or ulceration CNS:  Alert and Oriented x 4 , NonfFocal Deficits Vascular: pulses palpable throughout    Labs on Admission:  Basic Metabolic Panel:  Recent Labs Lab 06/27/14 2314  NA 137  K 6.2*  CL 98  CO2 24  GLUCOSE 304*  BUN 39*  CREATININE 1.64*  CALCIUM 8.8   Liver Function Tests:  Recent Labs Lab 06/27/14 2314  AST 52*  ALT 34  ALKPHOS 79  BILITOT 0.4  PROT 6.2  ALBUMIN 2.4*   No results for input(s): LIPASE, AMYLASE in the last 168 hours. No results for input(s): AMMONIA in the last 168 hours. CBC:  Recent Labs Lab 06/27/14 2314  WBC 14.2*  NEUTROABS 11.0*  HGB 7.0*  HCT 23.1*  MCV 98.3  PLT 312   Cardiac Enzymes:  Recent Labs Lab 06/27/14 2314  TROPONINI 0.49*    BNP (last 3 results)  Recent Labs  05/27/14 1430 06/03/14 1328  PROBNP 1578.0* 4268.0*   CBG: No results for input(s): GLUCAP in the last 168 hours.  Radiological Exams on Admission: Dg Chest Port 1 View  06/28/2014   CLINICAL DATA:  Constant short of breath. Chest pain. Leg swelling.  EXAM: PORTABLE CHEST - 1 VIEW  COMPARISON:  Radiograph 06/03/2014  FINDINGS: Left-sided pacemaker overlies stable enlarged cardiac silhouette. No  effusion, infiltrate, pneumothorax.  IMPRESSION: Cardiomegaly without acute findings .   Electronically Signed   By: Suzy Bouchard M.D.   On: 06/28/2014 00:00     EKG: Independently reviewed, Read as Atrial Flutter but appears to be Sinus Tachycardia at 126 with pacer spikes   Assessment/Plan:   78 y.o. male with  Principal Problem:   1.   Symptomatic anemia   Transfuse total of 2 units   IV Protonix   Consult GI     Active Problems:   2.    Hyperkalemia- given IV Calcium Gluconate   Kayexalate 15 grams PO x 1       3.    SOB (shortness of breath)- Due to #1, and Multifactorial   NCO2 Titrate PRN   Continuous O2 Monitoring       4.    Sinus tachycardia   Due to #1   Tranasfuse and Volume Resuscitation     5.    Hypotension   Volume Resuscitation     7.    AKI (acute kidney injury)   Volume Resuscitation and    Monitor BUN/Cr.         8.    Coronary atherosclerosis   Cardiac Monitoring   Cycle Troponins     9.    COPD (chronic obstructive pulmonary disease) with emphysema    DUONebs PRN    10.   Cardiomyopathy, ischemic- EF 20-25% 2D 11/07/30/  Chronic systolic heart failure   Monitor For Signs of Fluid Overload   Will Recieve Lasix between Units of Blood and if BP tolerates and     Then resume his diuretic Rx once BP stablizes    11.   Decubitus ulcer of coccygeal region-excision and closure Nov 2015- By General Surgery Dr. Hassell Done   Wound Care evaluation for Rx   Wet to Dry Dressing Changes BID until wound Care evaluation        12.   DVT Prophylaxis   SCDs       Code Status:    FULL CODE   Family Communication:   Wife at bedside  Disposition Plan:    Inpatient Stepdown     Time spent:  [place] [facility] Pager [phone]   If [place] Please Contact the Day Rounding Team MD for [facility]  If 7PM-7AM, Please Contact Night-Floor Coverage  [email or web address] Password [email or web address] 06/28/2014, 2:26 AM

## 2014-06-28 NOTE — Progress Notes (Addendum)
[facility] Progress Note   ROBERTSON COLCLOUGH OZD:[id number] DOB: 08-08-1935 DOA: 06/27/2014 PCP: Dorian Heckle, MD  Brief narrative: [name] is a 78 y.o. male with a history of CAD, Ischemic Cardiomyopathy, Systolic CHF, COPD, Chronic Respiratory Failure , HTN , Hyperlipidemia, DM2, Remote history of Colon Cancer who presents to the ED with complaints of SOB and Chest pain that started at 9 pm. Shortness of breath was transient and improved after being placed on BiPAP in the ER. BP was 259 systolic HR 563-875.   He was found to be anemic with a hemoglobin of 7.0 dropping from 8.8 a few weeks ago. He does admit to black stools but he is on iron. Hemoccult was positive. He was recently hospitalized with a COPD exacerbation and continues to take 10 mg of prednisone daily.  Subjective: He feels like his breathing is much better. He has no strength and has felt this way for weeks. No complaints of abdominal pain.  Assessment/Plan: Principal Problem:   Symptomatic anemia -As been transfused 2 units of packed red blood cells -Repeat hemoglobin is 8.2  Active Problems: Hemoccult positive - I have contacted GI, Dr. Michail Sermon, who will evaluate the patient -On clear liquids -Continue twice a day PPI and hold aspirin    Coronary atherosclerosis- mildly elevated TNI - elevated TNI likely from acute resp distress- no chest pain -Holding aspirin    Cardiomyopathy, ischemic- EF 20-25% 2D 08/08/13 - given 1 dose of furosemide in between packed red blood cells  -Hold further furosemide for now - at baseline O2 requirements  -Continue digoxin  Addendum: A-fib - developed RVR around 4:30 with hypotension- SBP in 90s.  -resuming Dig- given low dose IV Metoprolol with improvement in BP and HR- will resume low dose short acting Cardizem  - not on anticoagulation other than ASA which is on hold due to GI bleed  S/p Bi-V pacer  Hypotension -Hold Avapro- Cardizem being resumed at  low dose  COPD (chronic obstructive pulmonary disease) with emphysema - on Chronic O2 at 2.5 L -Continue 10 mg of prednisone daily and nebulizer treatments along with Spiriva -Continue Mucinex    Decubitus ulcer of coccygeal region-excision and closure Nov 2015 -Wound care eval completed    Hyperkalemia -Improving-hold potassium and recheck    AKI (acute kidney injury) -Hold diuretics and follow  Diabetes mellitus -Low-dose sliding scale for now    Code Status: Full code  Family Communication: Wife  Disposition Plan: To be determined  DVT prophylaxis: SCDs  Consultants: GI  Procedures: None  Antibiotics: Anti-infectives    None         Objective: Filed Weights   06/27/14 2310  Weight: 63.05 kg (139 lb)    Intake/Output Summary (Last 24 hours) at 06/28/14 1456 Last data filed at 06/28/14 0514  Gross per 24 hour  Intake    835 ml  Output     50 ml  Net    785 ml     Vitals Filed Vitals:   06/28/14 0630 06/28/14 0645 06/28/14 0700 06/28/14 1350  BP: 101/46 109/54 115/51   Pulse: 89 95 98 105  Temp:      TempSrc:      Resp: 20 14 14    Height:      Weight:      SpO2: 100% 100% 100%     Exam: General:  awake alert oriented 3, No acute respiratory distress, pale conjunctiva  Lungs: Clear to auscultation bilaterally without wheezes or crackles  Cardiovascular: Regular rate and rhythm without murmur gallop or rub normal S1 and S2 Abdomen: Nontender, nondistended, soft, bowel sounds positive, no rebound, no ascites, no appreciable mass Extremities: No significant cyanosis, clubbing, or edema bilateral lower extremities  Data Reviewed: Basic Metabolic Panel:  Recent Labs Lab 06/27/14 2314 06/28/14 0152 06/28/14 0500  NA 137 136 134*  K 6.2* 5.9* 5.4*  CL 98 99 99  CO2 24 29 31   GLUCOSE 304* 282* 158*  BUN 39* 41* 40*  CREATININE 1.64* 1.61* 1.45*  CALCIUM 8.8 8.8 8.5   Liver Function Tests:  Recent Labs Lab 06/27/14 2314  AST 52*   ALT 34  ALKPHOS 79  BILITOT 0.4  PROT 6.2  ALBUMIN 2.4*   No results for input(s): LIPASE, AMYLASE in the last 168 hours. No results for input(s): AMMONIA in the last 168 hours. CBC:  Recent Labs Lab 06/27/14 2314 06/28/14 1115  WBC 14.2* 8.3  NEUTROABS 11.0*  --   HGB 7.0* 8.2*  8.1*  HCT 23.1* 25.5*  25.9*  MCV 98.3 94.4  PLT 312 210   Cardiac Enzymes:  Recent Labs Lab 06/27/14 2314  TROPONINI 0.49*   BNP (last 3 results)  Recent Labs  05/27/14 1430 06/03/14 1328  PROBNP 1578.0* 4268.0*   CBG:  Recent Labs Lab 06/28/14 0813 06/28/14 1128  GLUCAP 141* 182*    Recent Results (from the past 240 hour(s))  MRSA PCR Screening     Status: Abnormal   Collection Time: 06/28/14  8:12 AM  Result Value Ref Range Status   MRSA by PCR POSITIVE (A) NEGATIVE Final    Comment:        The GeneXpert MRSA Assay (FDA approved for NASAL specimens only), is one component of a comprehensive MRSA colonization surveillance program. It is not intended to diagnose MRSA infection nor to guide or monitor treatment for MRSA infections. RESULT CALLED TO, READ BACK BY AND VERIFIED WITH: BU[place]IA,R RN 06/28/14 1403 [place]      Studies:  Recent x-ray studies have been reviewed in detail by the Attending Physician  Scheduled Meds:  Scheduled Meds: . budesonide  0.5 mg Nebulization BID  . digoxin  0.125 mg Oral q morning - 10a  . guaiFENesin  600 mg Oral BID  . insulin aspart  0-9 Units Subcutaneous 6 times per day  . pantoprazole (PROTONIX) IV  40 mg Intravenous Q12H  . predniSONE  10 mg Oral Q breakfast  . sodium polystyrene  15 g Oral Once  . tiotropium  18 mcg Inhalation Daily   Continuous Infusions: . sodium chloride      Time spent on care of this patient: > 35 minutes   [place], MD 06/28/2014, 2:56 PM  LOS: 1 day   [facility] Office  [phone] Pager - Text Page per [email or web address]  If 7PM-7AM, please contact  night-coverage [email or web address]

## 2014-06-28 NOTE — ED Notes (Signed)
Attempted report 

## 2014-06-28 NOTE — Progress Notes (Signed)
Pt c/o of SOB. Dr. Wynelle Cleveland called and updated. New order received for xopenex q8H PRN for wheezing only. thanks

## 2014-06-28 NOTE — Consult Note (Signed)
Referring Provider: Dr. Wynelle Cleveland Primary Care Physician:  Dorian Heckle, MD Primary Gastroenterologist:  Mellody Memos.  Reason for Consultation:  Anemia  HPI: [name] is a 78 y.o. male with multiple medical problems as stated below and history of colon cancer and last colonoscopy in 04/2010 that where a small adenoma was removed. He is being seen for a consult due to anemia with a Hgb 7.0 down from 8.8 on 06/06/14. He has black stools chronically on iron pills. Denies hematochezia. Denies N/V/hematemesis. Had SOB and chest pain on presentation to the ER and has had a recent hospitalization for a COPD exacerbation. He reports chronic nerve damage years ago and reports bowel and bladder incontinence since then. Denies previous EGD. NSAIDs on occasion. Heme positive. Wife at bedside. Has an ICD/Pacemaker.    Past Medical History  Diagnosis Date  . Hypokalemia   . PVD (peripheral vascular disease)   . HTN (hypertension)   . CHF (congestive heart failure)   . Ventricular tachycardia   . Ischemic heart disease, chronic   . Dyslipidemia   . CAD (coronary artery disease)   . On home oxygen therapy     "~ 2.5L; pretty much 24/7" (05/27/2014)  . Pacemaker   . Self-catheterizes urinary bladder     since back surgery.  6- 7 times a day. (05/27/2014)  . Difficult intubation     pt states "not aware of difficult intubation, never told"  . Heart murmur     dx 1974  . Shortness of breath   . Head injury, closed, with brief LOC 2013  . Neurogenic bladder     since back surgery - self caths  . Neurogenic bowel     digitally stimulates daily.  Marland Kitchen COPD (chronic obstructive pulmonary disease)      PT USES OXYGEN 24 HRS A DAY - 2 L NASAL CANNULA  . Emphysema   . Anemia     SERUM MONOCLONAL IgM KAPPA PROTEIN - NO CLINICAL EVIDENCE FOR PROGRESSION TO MULTIPLE MYELOMA OR OTHER PROLIFERATIVE DISORDER - PER OFFICE NOTE DR. SHERRILL DATED 12/30/13  . Pneumonia X 2  . Borderline type 2 diabetes  mellitus   . AICD (automatic cardioverter/defibrillator) present   . TIA (transient ischemic attack) 1980's    "they say I did; I don't believe it"  . Colon cancer   . MRSA infection     Past Surgical History  Procedure Laterality Date  . Lumbar disc surgery  X 2  . Iliac artery stent Left   . Bi-ventricular implantable cardioverter defibrillator  (crt-d)  04-10-08    implantation of Medtronic Concerto Bi-V ICD [; Cristopher Peru MD  . Colonoscopy    . Polypectomy    . Insertion of iliac stent Right 06/04/2012    external   . Cardiac catheterization    . Insert / replace / remove pacemaker    . Endarterectomy femoral  06/28/2012    Procedure: ENDARTERECTOMY FEMORAL;  Surgeon: Serafina Mitchell, MD;  Location: [facility];  Service: Vascular;  Laterality: Left;  . Patch angioplasty  06/28/2012    Procedure: PATCH ANGIOPLASTY;  Surgeon: Serafina Mitchell, MD;  Location: [facility] OR;  Service: Vascular;  Laterality: Left;  Using 0.8 cm x 15.2 cm Hemashield Patch.  . Colon surgery  03/2004  . Cataract extraction w/ intraocular lens  implant, bilateral Bilateral   . Cholecystectomy    . Coronary artery bypass graft  1998    x3 vessels  . Femoral-popliteal bypass graft  Right 03/28/2013    Procedure: BYPASS GRAFT FEMORAL-BELOW KNEE POPLITEAL ARTERY Using 56m x 80 cm Propaten Graft ,With Right Common Femoral Artery Endarterectomy.;  Surgeon: VSerafina Mitchell MD;  Location: [facility] OR;  Service: Vascular;  Laterality: Right;  . Debridment of decubitus ulcer N/A 05/18/2014    Procedure: EXCISION AND CLOSURE OF DECUBITUS ULCER 5CM;  Surgeon: MPedro Earls MD;  Location: [facility] ORS;  Service: General;  Laterality: N/A;  . Back surgery    . Posterior lumbar fusion  X 1  . Abdominal aortagram N/A 02/21/2012    Procedure: ABDOMINAL AMaxcine Ham  Surgeon: VSerafina Mitchell MD;  [facility];  Service: Cardiovascular;  Laterality: N/A;  . Abdominal aortagram N/A 06/04/2012    Procedure: ABDOMINAL AORTAGRAM;  Surgeon:  VSerafina Mitchell MD;  [facility];  Service: Cardiovascular;  Laterality: N/A;  . Biv icd genertaor change out N/A 10/22/2012    Procedure: BIV ICD GENERTAOR CHANGE OUT;  Surgeon: GEvans Lance MD;  [facility];  Service: Cardiovascular;  Laterality: N/A;  . Abdominal aortagram N/A 03/18/2013    Procedure: ABDOMINAL AMaxcine Ham  Surgeon: VSerafina Mitchell MD;  [facility];  Service: Cardiovascular;  Laterality: N/A;  . Abdominal aortagram N/A 07/02/2013    Procedure: ABDOMINAL AORTAGRAM;  Surgeon: VSerafina Mitchell MD;  [facility];  Service: Cardiovascular;  Laterality: N/A;  . Lower extremity angiogram Bilateral 07/02/2013    Procedure: LOWER EXTREMITY ANGIOGRAM;  Surgeon: VSerafina Mitchell MD;  [facility];  Service: Cardiovascular;  Laterality: Bilateral;  . Percutaneous stent intervention Right 07/02/2013    Procedure: PERCUTANEOUS STENT INTERVENTION;  Surgeon: VSerafina Mitchell MD;  [facility];  Service: Cardiovascular;  Laterality: Right;  rt ext iliac stent    Prior to Admission medications   Medication Sig Start Date End Date Taking? Authorizing Provider  acetaminophen (TYLENOL) 325 MG tablet Take 2 tablets (650 mg total) by mouth every 4 (four) hours as needed for headache or mild pain. 08/09/13  Yes Luke K Kilroy, PA-C  albuterol (PROVENTIL HFA;VENTOLIN HFA) 108 (90 BASE) MCG/ACT inhaler Inhale 2 puffs into the lungs every 6 (six) hours as needed for wheezing or shortness of breath.  10/02/12  Yes KKathee Delton MD  albuterol (PROVENTIL) (2.5 MG/3ML) 0.083% nebulizer solution Take 2.5 mg by nebulization 4 (four) times daily.    Yes Historical Provider, MD  ALPRAZolam (XANAX) 0.25 MG tablet Take 1 tablet (0.25 mg total) by mouth 2 (two) times daily as needed for anxiety (sob). 06/09/14  Yes Costin MKarlyne Greenspan MD  aspirin EC 325 MG tablet Take 325 mg by mouth daily.   Yes Historical Provider, MD  budesonide (PULMICORT) 0.5 MG/2ML  nebulizer solution Take 0.5 mg by nebulization 2 (two) times daily.    Yes Historical Provider, MD  candesartan (ATACAND) 32 MG tablet Take 32 mg by mouth daily.   Yes Historical Provider, MD  digoxin (LANOXIN) 0.125 MG tablet Take 0.125 mg by mouth every morning.   Yes Historical Provider, MD  diltiazem (CARDIZEM CD) 180 MG 24 hr capsule Take 1 capsule (180 mg total) by mouth daily. 06/09/14  Yes Costin MKarlyne Greenspan MD  ferrous sulfate 325 (65 FE) MG tablet Take 325 mg by mouth at bedtime.    Yes Historical Provider, MD  furosemide (LASIX) 40 MG tablet TAKE 2 TABLETS IN THE MORNING AND 1 TABLET IN THE EVENING 04/23/14  Yes KKathee Delton MD  GLUCERNA (GLUCERNA) LIQD Take 237 mLs by mouth 3 (three) times daily between meals.   Yes Historical Provider, MD  guaiFENesin (MUCINEX) 600 MG 12 hr tablet Take 600 mg by mouth 2 (two) times daily. congestion   Yes Historical Provider, MD  Hydrocodone-Acetaminophen 5-300 MG TABS Take 1 tablet by mouth every 6 (six) hours as needed (pain).   Yes Historical Provider, MD  irbesartan (AVAPRO) 150 MG tablet Take 1 tablet (150 mg total) by mouth daily. 06/09/14  Yes Costin Karlyne Greenspan, MD  KLOR-CON M20 20 MEQ tablet TAKE 1 TABLET DAILY 02/17/14  Yes Kathee Delton, MD  megestrol (MEGACE) 20 MG tablet Take 1 tablet (20 mg total) by mouth 2 (two) times daily. 06/09/14  Yes Costin Karlyne Greenspan, MD  mupirocin ointment (BACTROBAN) 2 % Apply 1 application topically 2 (two) times daily as needed (Cuts and bruises).   Yes Historical Provider, MD  nitroGLYCERIN (NITRODUR - DOSED IN MG/24 HR) 0.2 mg/hr patch Place 1 patch onto the skin daily.   Yes Historical Provider, MD  nitroGLYCERIN (NITROSTAT) 0.4 MG SL tablet Place 0.4 mg under the tongue every 5 (five) minutes as needed for chest pain.   Yes Historical Provider, MD  predniSONE (DELTASONE) 10 MG tablet Take 1 tablet (10 mg total) by mouth daily. Take 20 mg (2 tablets) for 4 days then return to 10 mg daily Patient taking  differently: Take 10 mg by mouth daily.  06/09/14  Yes Costin Karlyne Greenspan, MD  PRESCRIPTION MEDICATION Uses 1L-2L of home Oxygen   Yes Historical Provider, MD  simvastatin (ZOCOR) 20 MG tablet Take 20 mg by mouth at bedtime.    Yes Historical Provider, MD  tiotropium (SPIRIVA) 18 MCG inhalation capsule Place 18 mcg into inhaler and inhale daily.    Yes Historical Provider, MD  aspirin EC 325 MG tablet Take 325 mg by mouth at bedtime.    Historical Provider, MD  collagenase (SANTYL) ointment Apply topically 2 (two) times daily. Patient not taking: Reported on 06/28/2014 06/02/14   Orson Eva, MD  feeding supplement, ENSURE COMPLETE, (ENSURE COMPLETE) LIQD Take 237 mLs by mouth 2 (two) times daily between meals. Patient not taking: Reported on 06/28/2014 05/29/14   Orson Eva, MD  HYDROcodone-acetaminophen (VICODIN) 2.5-500 MG per tablet Take 1 tablet by mouth every 6 (six) hours as needed for pain.    Historical Provider, MD  levofloxacin (LEVAQUIN) 500 MG tablet Take 1 tablet (500 mg total) by mouth daily. Patient not taking: Reported on 06/28/2014 06/09/14   Caren Griffins, MD  PROVENTIL HFA 108 (90 BASE) MCG/ACT inhaler USE 1 TO 2 INHALATIONS INTO THE LUNGS EVERY 6 HOURS AS NEEDED FOR SHORTNESS OF BREATH Patient not taking: Reported on 06/28/2014 06/24/14   Kathee Delton, MD    Scheduled Meds: . budesonide  0.5 mg Nebulization BID  . digoxin  0.125 mg Oral q morning - 10a  . guaiFENesin  600 mg Oral BID  . insulin aspart  0-9 Units Subcutaneous 6 times per day  . pantoprazole (PROTONIX) IV  40 mg Intravenous Q12H  . predniSONE  10 mg Oral Q breakfast  . sodium polystyrene  15 g Oral Once  . tiotropium  18 mcg Inhalation Daily   Continuous Infusions: . sodium chloride     PRN Meds:.acetaminophen **OR** acetaminophen, ALPRAZolam, alum & mag hydroxide-simeth, HYDROcodone-acetaminophen, HYDROmorphone (DILAUDID) injection, ondansetron **OR** ondansetron (ZOFRAN) IV, oxyCODONE  Allergies as of  06/27/2014  . (No Known Allergies)    Family History  Problem Relation Age of Onset  . Heart failure Father   . Diabetes Father   . Heart disease Father   . Hyperlipidemia Father   . Hypertension Father   . Heart disease Mother     before age 32  . Hyperlipidemia Mother   . Hypertension Mother     History   Social History  . Marital Status: Married    Spouse Name: N/A    Number of Children: N/A  . Years of Education: N/A   Occupational History  .      Retired Social research officer, [employer]   Social History Main Topics  . Smoking status: Former Smoker -- 3.00 packs/day for 48 years    Types: Cigarettes    Quit date: 07/03/2002  . Smokeless tobacco: Never Used  . Alcohol Use: Yes     Comment: 05/27/2014 "a beer q 6 months or so"  . Drug Use: No  . Sexual Activity: No   Other Topics Concern  . Not on file   Social History Narrative   Married, wife [name]   Oxygen-continuous   I/O self cath since back surgery    Review of Systems: All negative from GI standpoint except as stated above in HPI.  Physical Exam: Vital signs: Filed Vitals:   06/28/14 1350  BP: 115/51  Pulse: 105  Temp: 99  Resp: 14     General:   Elderly, frail, lethargic, no acute distress, well-nourished HEENT: anicteric Lungs:  Coarse breath sounds Heart:  Regular rate and rhythm Abdomen: soft, nontender, nondistended, +BS  Rectal:  Deferred Ext: trace to 1+ pitting LE edema  GI:  Lab Results:  Recent Labs  06/27/14 2314 06/28/14 1115  WBC 14.2* 8.3  HGB 7.0* 8.2*  8.1*  HCT 23.1* 25.5*  25.9*  PLT 312 210   BMET  Recent Labs  06/27/14 2314 06/28/14 0152 06/28/14 0500  NA 137 136 134*  K 6.2* 5.9* 5.4*  CL 98 99 99  CO2 _0 GLUCOSE 304* 282* 158*  BUN 39* 41* 40*  CREATININE 1.64* 1.61* 1.45*  CALCIUM 8.8 8.8 8.5   LFT  Recent Labs  06/27/14 2314  PROT 6.2  ALBUMIN 2.4*  AST 52*  ALT 34  ALKPHOS 79  BILITOT 0.4   PT/INR No results for  input(s): LABPROT, INR in the last 72 hours.   Studies/Results: Dg Chest Port 1 View  06/28/2014   CLINICAL DATA:  Constant short of breath. Chest pain. Leg swelling.  EXAM: PORTABLE CHEST - 1 VIEW  COMPARISON:  Radiograph 06/03/2014  FINDINGS: Left-sided pacemaker overlies stable enlarged cardiac silhouette. No effusion, infiltrate, pneumothorax.  IMPRESSION: Cardiomegaly without acute findings .   Electronically Signed   By: Suzy Bouchard M.D.   On: 06/28/2014 00:00    Impression/Plan:  78 yo with worsening anemia and heme positive stool. Chronically black stools on iron pills. EGD tomorrow to look for peptic ulcer disease. NPO p MN. Supportive care. Dr.Edwards to f/u and do procedure tomorrow. Risks/benefits discussed and patient agrees to proceed.   LOS: 1 day   [place] C.  06/28/2014, 3:50 PM

## 2014-06-28 NOTE — Consult Note (Addendum)
WOC wound consult note Reason for Consult: Pt is followed by Dr Hassell Done of the [place] team as an outpatient for chronic sacral wound; this area had surgical debridement in November Wound type: Stage 4 Pressure Ulcer POA: Yes Measurement: 6X5X3cm Wound bed: 80% red, 20% yellow slough; bone palpable Drainage (amount, consistency, odor) Mod amt pink drainage, no odor Periwound: Red macerated skin surrounding wound with patchy areas of red partial thickness skin loss; appearance consistent with medical adhesive related skin  Injury. Dressing procedure/placement/frequency: Continue present plan of care as ordered by Dr Hassell Done with moist gauze packing BID.  Air mattress replacement to minimize pressure to site. Pt can resume follow-up with the surgeon after discharge. Please re-consult if further assistance is needed.  Thank-you,  Julien Girt MSN, [place], [place], [place], [place]

## 2014-06-28 NOTE — ED Notes (Signed)
Attempted to call report x2

## 2014-06-28 NOTE — ED Notes (Signed)
Consent for blood signed by patient and witnessed by wife and this RN.

## 2014-06-29 ENCOUNTER — Inpatient Hospital Stay (HOSPITAL_COMMUNITY): Payer: Medicare Other | Admitting: Anesthesiology

## 2014-06-29 ENCOUNTER — Encounter (HOSPITAL_COMMUNITY): Payer: Self-pay | Admitting: *Deleted

## 2014-06-29 ENCOUNTER — Encounter (HOSPITAL_COMMUNITY)
Admission: EM | Disposition: A | Discharge status: Home Health | Payer: Self-pay | Source: Home / Self Care | Attending: Internal Medicine

## 2014-06-29 DIAGNOSIS — M7989 Other specified soft tissue disorders: Secondary | ICD-10-CM

## 2014-06-29 HISTORY — PX: ESOPHAGOGASTRODUODENOSCOPY (EGD) WITH PROPOFOL: SHX5813

## 2014-06-29 LAB — CBC
HCT: 26.5 % — ABNORMAL LOW (ref 39.0–52.0)
Hemoglobin: 8.3 g/dL — ABNORMAL LOW (ref 13.0–17.0)
MCH: 29.1 pg (ref 26.0–34.0)
MCHC: 31.3 g/dL (ref 30.0–36.0)
MCV: 93 fL (ref 78.0–100.0)
PLATELETS: 222 10*3/uL (ref 150–400)
RBC: 2.85 MIL/uL — AB (ref 4.22–5.81)
RDW: 16.3 % — ABNORMAL HIGH (ref 11.5–15.5)
WBC: 6.6 10*3/uL (ref 4.0–10.5)

## 2014-06-29 LAB — TYPE AND SCREEN
ABO/RH(D): O POS
Antibody Screen: NEGATIVE
Unit division: 0
Unit division: 0

## 2014-06-29 LAB — BASIC METABOLIC PANEL
ANION GAP: 8 (ref 5–15)
BUN: 27 mg/dL — ABNORMAL HIGH (ref 6–23)
CO2: 26 mmol/L (ref 19–32)
Calcium: 8.4 mg/dL (ref 8.4–10.5)
Chloride: 102 mEq/L (ref 96–112)
Creatinine, Ser: 0.89 mg/dL (ref 0.50–1.35)
GFR calc Af Amer: >90 mL/min (ref >90)
GFR calc non Af Amer: 81 mL/min — ABNORMAL LOW (ref >90)
GLUCOSE: 94 mg/dL (ref 70–99)
POTASSIUM: 4.4 mmol/L (ref 3.5–5.1)
SODIUM: 136 mmol/L (ref 135–145)

## 2014-06-29 LAB — GLUCOSE, CAPILLARY
GLUCOSE-CAPILLARY: 129 mg/dL — AB (ref 70–99)
GLUCOSE-CAPILLARY: 137 mg/dL — AB (ref 70–99)
GLUCOSE-CAPILLARY: 144 mg/dL — AB (ref 70–99)
GLUCOSE-CAPILLARY: 89 mg/dL (ref 70–99)
GLUCOSE-CAPILLARY: 93 mg/dL (ref 70–99)
Glucose-Capillary: 92 mg/dL (ref 70–99)

## 2014-06-29 SURGERY — ESOPHAGOGASTRODUODENOSCOPY (EGD) WITH PROPOFOL
Anesthesia: Monitor Anesthesia Care

## 2014-06-29 MED ORDER — LACTATED RINGERS IV SOLN
INTRAVENOUS | Status: DC | PRN
  Administered 2014-06-29: 13:00:00 via INTRAVENOUS

## 2014-06-29 MED ORDER — CHLORHEXIDINE GLUCONATE CLOTH 2 % EX PADS
6.0000 | MEDICATED_PAD | Freq: Every day | CUTANEOUS | Status: AC
  Administered 2014-06-29 – 2014-07-03 (×5): 6 via TOPICAL

## 2014-06-29 MED ORDER — MUPIROCIN 2 % EX OINT
1.0000 "application " | TOPICAL_OINTMENT | Freq: Two times a day (BID) | CUTANEOUS | Status: AC
  Administered 2014-06-29 – 2014-07-03 (×10): 1 via NASAL
  Filled 2014-06-29 (×3): qty 22

## 2014-06-29 MED ORDER — PROPOFOL INFUSION 10 MG/ML OPTIME
INTRAVENOUS | Status: DC | PRN
  Administered 2014-06-29: 75 ug/kg/min via INTRAVENOUS

## 2014-06-29 MED ORDER — BUTAMBEN-TETRACAINE-BENZOCAINE 2-2-14 % EX AERO
INHALATION_SPRAY | CUTANEOUS | Status: DC | PRN
  Administered 2014-06-29: 2 via TOPICAL

## 2014-06-29 MED ORDER — MIDAZOLAM HCL 5 MG/5ML IJ SOLN
INTRAMUSCULAR | Status: DC | PRN
  Administered 2014-06-29: 1 mg via INTRAVENOUS

## 2014-06-29 NOTE — Progress Notes (Signed)
Patient Demographics  [name], is a 78 y.o. male, DOB - 1935-11-29, [id number]  Admit date - 06/27/2014   Admitting Physician Theressa Millard, MD  Outpatient Primary MD for the patient is Dorian Heckle, MD  LOS - 2   Chief Complaint  Patient presents with  . Respiratory Distress        Subjective:   [name] today has, No headache, No chest pain, No abdominal pain - No Nausea, No new weakness tingling or numbness, No Cough - SOB.   Assessment & Plan    Principal Problem:   Symptomatic anemia Active Problems:   Coronary atherosclerosis   Cardiomyopathy, ischemic- EF 20-25% 2D 08/08/13   COPD (chronic obstructive pulmonary disease) with emphysema   Chronic systolic heart failure   Decubitus ulcer of coccygeal region-excision and closure Nov 2015   Hyperkalemia   SOB (shortness of breath)   Sinus tachycardia   Hypotension   AKI (acute kidney injury)  Symptomatic anemia -As been transfused 2 units of packed red blood cells -Repeat hemoglobin is 8. Today. - We'll check anemia panel in 24 hours given recent transfusion.  Hemoccult positive -Continue twice a day PPI and hold aspirin - post endoscopy 12/28, only significant for hiatal hernia, with no evidence of active bleed or gastritis or ulcer.   Coronary atherosclerosis - mildly elevated TNI - elevated TNI likely from acute resp distress- no chest pain -Holding aspirin   Cardiomyopathy, ischemic- EF 20-25% 2D 08/08/13 - given 1 dose of furosemide in between packed red blood cells  -Hold further furosemide for now - at baseline O2 requirements  -Continue digoxin   A-fib - developed RVR around  requiring  low dose IV Metoprolol  - will resume low dose short acting Cardizem  - not on anticoagulation other than ASA which is on hold due to GI bleed  S/p Bi-V  pacer Hypotension Hold Avapro- Cardizem being resumed at low dose  COPD (chronic obstructive pulmonary disease) with emphysema - on Chronic O2 at 2.5 L -Continue 10 mg of prednisone daily and nebulizer treatments along with Spiriva -Continue Mucinex   Decubitus ulcer of coccygeal region-excision and closure Nov 2015 -Wound care eval completed   Hyperkalemia -Resolved    AKI (acute kidney injury) -Hold diuretics and follow -Resolved   Diabetes mellitus -Low-dose sliding scale for now - Abdomen A1c is 7.9   left arm edema  -Check venous Doppler to rule out DVT     Code Status: full   Family Communication: none at bedside   Disposition Plan: remains on telemetry    Procedures  Endoscopy 12/28 -2 units packed red blood cell transfusion 12/27   Consults  Gastroenterology    Medications  Scheduled Meds: . budesonide  0.5 mg Nebulization BID  . Chlorhexidine Gluconate Cloth  6 each Topical Q0600  . diltiazem  30 mg Oral 3 times per day  . guaiFENesin  600 mg Oral BID  . insulin aspart  0-9 Units Subcutaneous 6 times per day  . mupirocin ointment  1 application Nasal BID  . pantoprazole (PROTONIX) IV  40 mg Intravenous Q12H  . predniSONE  10 mg Oral Q breakfast  . tiotropium  18 mcg Inhalation Daily   Continuous Infusions:  PRN Meds:.acetaminophen **OR** acetaminophen, ALPRAZolam, alum & mag hydroxide-simeth, HYDROcodone-acetaminophen, HYDROmorphone (DILAUDID) injection, levalbuterol, ondansetron **OR** ondansetron (ZOFRAN) IV, oxyCODONE  DVT Prophylaxis   SCDs   Lab Results  Component Value Date   PLT 222 06/29/2014    Antibiotics    Anti-infectives    None          Objective:   Filed Vitals:   06/29/14 1408 06/29/14 1415 06/29/14 1423 06/29/14 1619  BP:  127/48 119/47 99/45  Pulse: 106 94 56 103  Temp:    98.3 F (36.8 C)  TempSrc:    Oral  Resp: 22 20 18 19   Height:      Weight:      SpO2: 100% 100% 100% 100%    Wt Readings  from Last 3 Encounters:  06/27/14 63.05 kg (139 lb)  06/08/14 63.277 kg (139 lb 8 oz)  05/30/14 63.504 kg (140 lb)     Intake/Output Summary (Last 24 hours) at 06/29/14 1807 Last data filed at 06/29/14 1620  Gross per 24 hour  Intake    950 ml  Output   1050 ml  Net   -100 ml     Physical Exam  Awake Alert, Oriented X 3, No new F.N deficits, Normal affect [place].AT,PERRAL Supple Neck,No JVD, No cervical lymphadenopathy appriciated.  Symmetrical Chest wall movement, Good air movement bilaterally, CTAB RRR,No Gallops,Rubs or new Murmurs, No Parasternal Heave +ve B.Sounds, Abd Soft, No tenderness, No organomegaly appriciated, No rebound - guarding or rigidity. No Cyanosis, Clubbing or edema, No new Rash or bruise , has stage IV sacral decubitus ulcer , left arm edema    Data Review   Micro Results Recent Results (from the past 240 hour(s))  MRSA PCR Screening     Status: Abnormal   Collection Time: 06/28/14  8:12 AM  Result Value Ref Range Status   MRSA by PCR POSITIVE (A) NEGATIVE Final    Comment:        The GeneXpert MRSA Assay (FDA approved for NASAL specimens only), is one component of a comprehensive MRSA colonization surveillance program. It is not intended to diagnose MRSA infection nor to guide or monitor treatment for MRSA infections. RESULT CALLED TO, READ BACK BY AND VERIFIED WITH: BUENDIA,R RN 06/28/14 1403 [place]     Radiology Reports Dg Chest Port 1 View  06/28/2014   CLINICAL DATA:  Constant short of breath. Chest pain. Leg swelling.  EXAM: PORTABLE CHEST - 1 VIEW  COMPARISON:  Radiograph 06/03/2014  FINDINGS: Left-sided pacemaker overlies stable enlarged cardiac silhouette. No effusion, infiltrate, pneumothorax.  IMPRESSION: Cardiomegaly without acute findings .   Electronically Signed   By: Suzy Bouchard M.D.   On: 06/28/2014 00:00    CBC  Recent Labs Lab 06/27/14 2314 06/28/14 1115 06/29/14 0503  WBC 14.2* 8.3 6.6  HGB 7.0* 8.2*  8.1*  8.3*  HCT 23.1* 25.5*  25.9* 26.5*  PLT 312 210 222  MCV 98.3 94.4 93.0  MCH 29.8 30.4 29.1  MCHC 30.3 32.2 31.3  RDW 16.2* 16.5* 16.3*  LYMPHSABS 2.0  --   --   MONOABS 1.1*  --   --   EOSABS 0.1  --   --   BASOSABS 0.0  --   --     Chemistries   Recent Labs Lab 06/27/14 2314 06/28/14 0152 06/28/14 0500 06/28/14 1905 06/29/14 0503  NA 137 136 134* 132* 136  K 6.2* 5.9* 5.4* 4.9 4.4  CL 98 99 99 98 102  CO2  24 29 31 28 26   GLUCOSE 304* 282* 158* 184* 94  BUN 39* 41* 40* 33* 27*  CREATININE 1.64* 1.61* 1.45* 1.13 0.89  CALCIUM 8.8 8.8 8.5 8.1* 8.4  AST 52*  --   --   --   --   ALT 34  --   --   --   --   ALKPHOS 79  --   --   --   --   BILITOT 0.4  --   --   --   --    ------------------------------------------------------------------------------------------------------------------ estimated creatinine clearance is 63 mL/min (by C-G formula based on Cr of 0.89). ------------------------------------------------------------------------------------------------------------------  Recent Labs  06/28/14 1115  HGBA1C 7.9*   ------------------------------------------------------------------------------------------------------------------ No results for input(s): CHOL, HDL, LDLCALC, TRIG, CHOLHDL, LDLDIRECT in the last 72 hours. ------------------------------------------------------------------------------------------------------------------ No results for input(s): TSH, T4TOTAL, T3FREE, THYROIDAB in the last 72 hours.  Invalid input(s): FREET3 ------------------------------------------------------------------------------------------------------------------ No results for input(s): VITAMINB12, FOLATE, FERRITIN, TIBC, IRON, RETICCTPCT in the last 72 hours.  Coagulation profile No results for input(s): INR, PROTIME in the last 168 hours.  No results for input(s): DDIMER in the last 72 hours.  Cardiac Enzymes  Recent Labs Lab 06/27/14 2314  TROPONINI 0.49*    ------------------------------------------------------------------------------------------------------------------ Invalid input(s): POCBNP     Time Spent in minutes   30 minutes    Kinza Gouveia M.D on 06/29/2014 at 6:07 PM  Between 7am to 7pm - Pager - [phone]  After 7pm go to [email or web address] - password [email or web address]  And look for the night coverage person covering for me after hours  [facility] Office  [phone]   **Disclaimer: This note may have been dictated with voice recognition software. Similar sounding words can inadvertently be transcribed and this note may contain transcription errors which may not have been corrected upon publication of note.**

## 2014-06-29 NOTE — Op Note (Signed)
[facility] [place], [id number]   ENDOSCOPY PROCEDURE REPORT  PATIENT: [name], [name]  MR#: [id number] BIRTHDATE: [birth year] , 78  yrs. old GENDER: male ENDOSCOPIST:Emelda Kohlbeck Oletta Lamas, MD REFERRED BY: R.  Marcellus Scott, M.D. PROCEDURE DATE:  06/29/2014 PROCEDURE:   EGD, diagnostic ASA CLASS:    Class III INDICATIONS: iron deficiency anemia. MEDICATION: Propofol 60 mg IV TOPICAL ANESTHETIC:   Cetacaine Spray  DESCRIPTION OF PROCEDURE:   After the risks and benefits of the procedure were explained, informed consent was obtained.  The Pentax Gastroscope [id number]  endoscope was introduced through the mouth  and advanced to the second portion of the duodenum .  The instrument was slowly withdrawn as the mucosa was fully examined.      ESOPHAGUS: The mucosa of the esophagus appeared normal.   Moderate hiatal hernia.  STOMACH: The mucosa of the stomach appeared normal.  DUODENUM: The duodenal mucosa showed no abnormalities in the 3rd part of the duodenum.    Retroflexed views revealed no abnormalities.    The scope was then withdrawn from the patient and the procedure completed.  COMPLICATIONS: There were no immediate complications.  ENDOSCOPIC IMPRESSION: 1.   The mucosa of the esophagus appeared normal 2.   Moderate hiatal hernia 3.   The mucosa of the stomach appeared normal 4.   The duodenal mucosa showed no abnormalities in the 3rd part of the duodenum 5.    nothing to explain the patient's anemia found on EGD RECOMMENDATIONS: Will resume diet and will check the patient's stool to see if he is actually bleeding.  likely need anemia workup.   _______________________________ Lorrin MaisLaurence Spates, MD 06/29/2014 2:09 PM     cc:      Dr. Dorian Heckle. [facility]: ICD CODES:  The ICD and CPT codes recommended by this software are interpretations from the data that the clinical staff has captured with  the software.  The verification of the translation of this report to the ICD and CPT codes and modifiers is the sole responsibility of the health care institution and practicing physician where this report was generated.  [employer] will not be held responsible for the validity of the ICD and CPT codes included on this report.  AMA assumes no liability for data contained or not contained herein. CPT is a Designer, television/film set of the [employer].  PATIENT NAME:  Kwan, Shellhammer MR#: [id number]

## 2014-06-29 NOTE — Anesthesia Preprocedure Evaluation (Addendum)
Anesthesia Evaluation  Patient identified by MRN, date of birth, ID band Patient awake    Reviewed: Allergy & Precautions, H&P , NPO status , Patient's Chart, lab work & pertinent test results  Airway Mallampati: II  TM Distance: >3 FB Neck ROM: Full    Dental  (+) Edentulous Upper, Dental Advisory Given   Pulmonary shortness of breath and Long-Term Oxygen Therapy, pneumonia -, resolved, COPD COPD inhaler and oxygen dependent, former smoker,    + decreased breath sounds      Cardiovascular hypertension, Pt. on medications and Pt. on home beta blockers + CAD, + Peripheral Vascular Disease and +CHF + pacemaker + Cardiac Defibrillator + Valvular Problems/Murmurs Rhythm:Regular Rate:Normal     Neuro/Psych TIA   GI/Hepatic Neg liver ROS, GERD-  Controlled,  Endo/Other  diabetes, Well Controlled, Type 2, Insulin Dependent  Renal/GU Renal disease     Musculoskeletal   Abdominal   Peds  Hematology  (+) anemia ,   Anesthesia Other Findings   Reproductive/Obstetrics                            Anesthesia Physical Anesthesia Plan  ASA: IV  Anesthesia Plan: MAC   Post-op Pain Management:    Induction: Intravenous  Airway Management Planned: Simple Face Mask  Additional Equipment:   Intra-op Plan:   Post-operative Plan:   Informed Consent: I have reviewed the patients History and Physical, chart, labs and discussed the procedure including the risks, benefits and alternatives for the proposed anesthesia with the patient or authorized representative who has indicated his/her understanding and acceptance.   Dental advisory given  Plan Discussed with: CRNA and Anesthesiologist  Anesthesia Plan Comments:        Anesthesia Quick Evaluation

## 2014-06-29 NOTE — Transfer of Care (Signed)
Immediate Anesthesia Transfer of Care Note  Patient: [name]  Procedure(s) Performed: Procedure(s): ESOPHAGOGASTRODUODENOSCOPY (EGD) WITH PROPOFOL (N/A)  Patient Location: Endoscopy Unit  Anesthesia Type:MAC  Level of Consciousness: awake, alert , oriented and patient cooperative  Airway & Oxygen Therapy: Patient Spontanous Breathing and Patient connected to face mask oxygen  Post-op Assessment: Report given to PACU RN and Post -op Vital signs reviewed and stable  Post vital signs: Reviewed and stable  Complications: No apparent anesthesia complications

## 2014-06-29 NOTE — Anesthesia Postprocedure Evaluation (Signed)
  Anesthesia Post-op Note  Patient: [name]  Procedure(s) Performed: Procedure(s): ESOPHAGOGASTRODUODENOSCOPY (EGD) WITH PROPOFOL (N/A)  Patient Location: PACU  Anesthesia Type:MAC  Level of Consciousness: awake  Airway and Oxygen Therapy: Patient Spontanous Breathing and Patient connected to nasal cannula oxygen  Post-op Pain: none  Post-op Assessment: Post-op Vital signs reviewed, Patient's Cardiovascular Status Stable, Respiratory Function Stable, Patent Airway, No signs of Nausea or vomiting and Pain level controlled  Post-op Vital Signs: Reviewed and stable  Last Vitals:  Filed Vitals:   06/29/14 1423  BP: 119/47  Pulse: 56  Temp:   Resp: 18    Complications: No apparent anesthesia complications

## 2014-06-29 NOTE — H&P (View-Only) (Signed)
Referring Provider: Dr. Wynelle Cleveland Primary Care Physician:  Dorian Heckle, MD Primary Gastroenterologist:  Mellody Memos.  Reason for Consultation:  Anemia  HPI: [name] is a 78 y.o. male with multiple medical problems as stated below and history of colon cancer and last colonoscopy in 04/2010 that where a small adenoma was removed. He is being seen for a consult due to anemia with a Hgb 7.0 down from 8.8 on 06/06/14. He has black stools chronically on iron pills. Denies hematochezia. Denies N/V/hematemesis. Had SOB and chest pain on presentation to the ER and has had a recent hospitalization for a COPD exacerbation. He reports chronic nerve damage years ago and reports bowel and bladder incontinence since then. Denies previous EGD. NSAIDs on occasion. Heme positive. Wife at bedside. Has an ICD/Pacemaker.    Past Medical History  Diagnosis Date  . Hypokalemia   . PVD (peripheral vascular disease)   . HTN (hypertension)   . CHF (congestive heart failure)   . Ventricular tachycardia   . Ischemic heart disease, chronic   . Dyslipidemia   . CAD (coronary artery disease)   . On home oxygen therapy     "~ 2.5L; pretty much 24/7" (05/27/2014)  . Pacemaker   . Self-catheterizes urinary bladder     since back surgery.  6- 7 times a day. (05/27/2014)  . Difficult intubation     pt states "not aware of difficult intubation, never told"  . Heart murmur     dx 1974  . Shortness of breath   . Head injury, closed, with brief LOC 2013  . Neurogenic bladder     since back surgery - self caths  . Neurogenic bowel     digitally stimulates daily.  Marland Kitchen COPD (chronic obstructive pulmonary disease)      PT USES OXYGEN 24 HRS A DAY - 2 L NASAL CANNULA  . Emphysema   . Anemia     SERUM MONOCLONAL IgM KAPPA PROTEIN - NO CLINICAL EVIDENCE FOR PROGRESSION TO MULTIPLE MYELOMA OR OTHER PROLIFERATIVE DISORDER - PER OFFICE NOTE DR. SHERRILL DATED 12/30/13  . Pneumonia X 2  . Borderline type 2 diabetes  mellitus   . AICD (automatic cardioverter/defibrillator) present   . TIA (transient ischemic attack) 1980's    "they say I did; I don't believe it"  . Colon cancer   . MRSA infection     Past Surgical History  Procedure Laterality Date  . Lumbar disc surgery  X 2  . Iliac artery stent Left   . Bi-ventricular implantable cardioverter defibrillator  (crt-d)  04-10-08    implantation of Medtronic Concerto Bi-V ICD [; Cristopher Peru MD  . Colonoscopy    . Polypectomy    . Insertion of iliac stent Right 06/04/2012    external   . Cardiac catheterization    . Insert / replace / remove pacemaker    . Endarterectomy femoral  06/28/2012    Procedure: ENDARTERECTOMY FEMORAL;  Surgeon: Serafina Mitchell, MD;  Location: [facility];  Service: Vascular;  Laterality: Left;  . Patch angioplasty  06/28/2012    Procedure: PATCH ANGIOPLASTY;  Surgeon: Serafina Mitchell, MD;  Location: [facility] OR;  Service: Vascular;  Laterality: Left;  Using 0.8 cm x 15.2 cm Hemashield Patch.  . Colon surgery  03/2004  . Cataract extraction w/ intraocular lens  implant, bilateral Bilateral   . Cholecystectomy    . Coronary artery bypass graft  1998    x3 vessels  . Femoral-popliteal bypass graft  Right 03/28/2013    Procedure: BYPASS GRAFT FEMORAL-BELOW KNEE POPLITEAL ARTERY Using 56m x 80 cm Propaten Graft ,With Right Common Femoral Artery Endarterectomy.;  Surgeon: VSerafina Mitchell MD;  Location: [facility] OR;  Service: Vascular;  Laterality: Right;  . Debridment of decubitus ulcer N/A 05/18/2014    Procedure: EXCISION AND CLOSURE OF DECUBITUS ULCER 5CM;  Surgeon: MPedro Earls MD;  Location: [facility] ORS;  Service: General;  Laterality: N/A;  . Back surgery    . Posterior lumbar fusion  X 1  . Abdominal aortagram N/A 02/21/2012    Procedure: ABDOMINAL AMaxcine Ham  Surgeon: VSerafina Mitchell MD;  [facility];  Service: Cardiovascular;  Laterality: N/A;  . Abdominal aortagram N/A 06/04/2012    Procedure: ABDOMINAL AORTAGRAM;  Surgeon:  VSerafina Mitchell MD;  [facility];  Service: Cardiovascular;  Laterality: N/A;  . Biv icd genertaor change out N/A 10/22/2012    Procedure: BIV ICD GENERTAOR CHANGE OUT;  Surgeon: GEvans Lance MD;  [facility];  Service: Cardiovascular;  Laterality: N/A;  . Abdominal aortagram N/A 03/18/2013    Procedure: ABDOMINAL AMaxcine Ham  Surgeon: VSerafina Mitchell MD;  [facility];  Service: Cardiovascular;  Laterality: N/A;  . Abdominal aortagram N/A 07/02/2013    Procedure: ABDOMINAL AORTAGRAM;  Surgeon: VSerafina Mitchell MD;  [facility];  Service: Cardiovascular;  Laterality: N/A;  . Lower extremity angiogram Bilateral 07/02/2013    Procedure: LOWER EXTREMITY ANGIOGRAM;  Surgeon: VSerafina Mitchell MD;  [facility];  Service: Cardiovascular;  Laterality: Bilateral;  . Percutaneous stent intervention Right 07/02/2013    Procedure: PERCUTANEOUS STENT INTERVENTION;  Surgeon: VSerafina Mitchell MD;  [facility];  Service: Cardiovascular;  Laterality: Right;  rt ext iliac stent    Prior to Admission medications   Medication Sig Start Date End Date Taking? Authorizing Provider  acetaminophen (TYLENOL) 325 MG tablet Take 2 tablets (650 mg total) by mouth every 4 (four) hours as needed for headache or mild pain. 08/09/13  Yes Luke K Kilroy, PA-C  albuterol (PROVENTIL HFA;VENTOLIN HFA) 108 (90 BASE) MCG/ACT inhaler Inhale 2 puffs into the lungs every 6 (six) hours as needed for wheezing or shortness of breath.  10/02/12  Yes KKathee Delton MD  albuterol (PROVENTIL) (2.5 MG/3ML) 0.083% nebulizer solution Take 2.5 mg by nebulization 4 (four) times daily.    Yes Historical Provider, MD  ALPRAZolam (XANAX) 0.25 MG tablet Take 1 tablet (0.25 mg total) by mouth 2 (two) times daily as needed for anxiety (sob). 06/09/14  Yes Costin MKarlyne Greenspan MD  aspirin EC 325 MG tablet Take 325 mg by mouth daily.   Yes Historical Provider, MD  budesonide (PULMICORT) 0.5 MG/2ML  nebulizer solution Take 0.5 mg by nebulization 2 (two) times daily.    Yes Historical Provider, MD  candesartan (ATACAND) 32 MG tablet Take 32 mg by mouth daily.   Yes Historical Provider, MD  digoxin (LANOXIN) 0.125 MG tablet Take 0.125 mg by mouth every morning.   Yes Historical Provider, MD  diltiazem (CARDIZEM CD) 180 MG 24 hr capsule Take 1 capsule (180 mg total) by mouth daily. 06/09/14  Yes Costin MKarlyne Greenspan MD  ferrous sulfate 325 (65 FE) MG tablet Take 325 mg by mouth at bedtime.    Yes Historical Provider, MD  furosemide (LASIX) 40 MG tablet TAKE 2 TABLETS IN THE MORNING AND 1 TABLET IN THE EVENING 04/23/14  Yes KKathee Delton MD  GLUCERNA (GLUCERNA) LIQD Take 237 mLs by mouth 3 (three) times daily between meals.   Yes Historical Provider, MD  guaiFENesin (MUCINEX) 600 MG 12 hr tablet Take 600 mg by mouth 2 (two) times daily. congestion   Yes Historical Provider, MD  Hydrocodone-Acetaminophen 5-300 MG TABS Take 1 tablet by mouth every 6 (six) hours as needed (pain).   Yes Historical Provider, MD  irbesartan (AVAPRO) 150 MG tablet Take 1 tablet (150 mg total) by mouth daily. 06/09/14  Yes Costin Karlyne Greenspan, MD  KLOR-CON M20 20 MEQ tablet TAKE 1 TABLET DAILY 02/17/14  Yes Kathee Delton, MD  megestrol (MEGACE) 20 MG tablet Take 1 tablet (20 mg total) by mouth 2 (two) times daily. 06/09/14  Yes Costin Karlyne Greenspan, MD  mupirocin ointment (BACTROBAN) 2 % Apply 1 application topically 2 (two) times daily as needed (Cuts and bruises).   Yes Historical Provider, MD  nitroGLYCERIN (NITRODUR - DOSED IN MG/24 HR) 0.2 mg/hr patch Place 1 patch onto the skin daily.   Yes Historical Provider, MD  nitroGLYCERIN (NITROSTAT) 0.4 MG SL tablet Place 0.4 mg under the tongue every 5 (five) minutes as needed for chest pain.   Yes Historical Provider, MD  predniSONE (DELTASONE) 10 MG tablet Take 1 tablet (10 mg total) by mouth daily. Take 20 mg (2 tablets) for 4 days then return to 10 mg daily Patient taking  differently: Take 10 mg by mouth daily.  06/09/14  Yes Costin Karlyne Greenspan, MD  PRESCRIPTION MEDICATION Uses 1L-2L of home Oxygen   Yes Historical Provider, MD  simvastatin (ZOCOR) 20 MG tablet Take 20 mg by mouth at bedtime.    Yes Historical Provider, MD  tiotropium (SPIRIVA) 18 MCG inhalation capsule Place 18 mcg into inhaler and inhale daily.    Yes Historical Provider, MD  aspirin EC 325 MG tablet Take 325 mg by mouth at bedtime.    Historical Provider, MD  collagenase (SANTYL) ointment Apply topically 2 (two) times daily. Patient not taking: Reported on 06/28/2014 06/02/14   Orson Eva, MD  feeding supplement, ENSURE COMPLETE, (ENSURE COMPLETE) LIQD Take 237 mLs by mouth 2 (two) times daily between meals. Patient not taking: Reported on 06/28/2014 05/29/14   Orson Eva, MD  HYDROcodone-acetaminophen (VICODIN) 2.5-500 MG per tablet Take 1 tablet by mouth every 6 (six) hours as needed for pain.    Historical Provider, MD  levofloxacin (LEVAQUIN) 500 MG tablet Take 1 tablet (500 mg total) by mouth daily. Patient not taking: Reported on 06/28/2014 06/09/14   Caren Griffins, MD  PROVENTIL HFA 108 (90 BASE) MCG/ACT inhaler USE 1 TO 2 INHALATIONS INTO THE LUNGS EVERY 6 HOURS AS NEEDED FOR SHORTNESS OF BREATH Patient not taking: Reported on 06/28/2014 06/24/14   Kathee Delton, MD    Scheduled Meds: . budesonide  0.5 mg Nebulization BID  . digoxin  0.125 mg Oral q morning - 10a  . guaiFENesin  600 mg Oral BID  . insulin aspart  0-9 Units Subcutaneous 6 times per day  . pantoprazole (PROTONIX) IV  40 mg Intravenous Q12H  . predniSONE  10 mg Oral Q breakfast  . sodium polystyrene  15 g Oral Once  . tiotropium  18 mcg Inhalation Daily   Continuous Infusions: . sodium chloride     PRN Meds:.acetaminophen **OR** acetaminophen, ALPRAZolam, alum & mag hydroxide-simeth, HYDROcodone-acetaminophen, HYDROmorphone (DILAUDID) injection, ondansetron **OR** ondansetron (ZOFRAN) IV, oxyCODONE  Allergies as of  06/27/2014  . (No Known Allergies)    Family History  Problem Relation Age of Onset  . Heart failure Father   . Diabetes Father   . Heart disease Father   . Hyperlipidemia Father   . Hypertension Father   . Heart disease Mother     before age 32  . Hyperlipidemia Mother   . Hypertension Mother     History   Social History  . Marital Status: Married    Spouse Name: N/A    Number of Children: N/A  . Years of Education: N/A   Occupational History  .      Retired Social research officer, government   Social History Main Topics  . Smoking status: Former Smoker -- 3.00 packs/day for 48 years    Types: Cigarettes    Quit date: 07/03/2002  . Smokeless tobacco: Never Used  . Alcohol Use: Yes     Comment: 05/27/2014 "a beer q 6 months or so"  . Drug Use: No  . Sexual Activity: No   Other Topics Concern  . Not on file   Social History Narrative   Married, wife [name]   Oxygen-continuous   I/O self cath since back surgery    Review of Systems: All negative from GI standpoint except as stated above in HPI.  Physical Exam: Vital signs: Filed Vitals:   06/28/14 1350  BP: 115/51  Pulse: 105  Temp: 99  Resp: 14     General:   Elderly, frail, lethargic, no acute distress, well-nourished HEENT: anicteric Lungs:  Coarse breath sounds Heart:  Regular rate and rhythm Abdomen: soft, nontender, nondistended, +BS  Rectal:  Deferred Ext: trace to 1+ pitting LE edema  GI:  Lab Results:  Recent Labs  06/27/14 2314 06/28/14 1115  WBC 14.2* 8.3  HGB 7.0* 8.2*  8.1*  HCT 23.1* 25.5*  25.9*  PLT 312 210   BMET  Recent Labs  06/27/14 2314 06/28/14 0152 06/28/14 0500  NA 137 136 134*  K 6.2* 5.9* 5.4*  CL 98 99 99  CO2 _0 GLUCOSE 304* 282* 158*  BUN 39* 41* 40*  CREATININE 1.64* 1.61* 1.45*  CALCIUM 8.8 8.8 8.5   LFT  Recent Labs  06/27/14 2314  PROT 6.2  ALBUMIN 2.4*  AST 52*  ALT 34  ALKPHOS 79  BILITOT 0.4   PT/INR No results for  input(s): LABPROT, INR in the last 72 hours.   Studies/Results: Dg Chest Port 1 View  06/28/2014   CLINICAL DATA:  Constant short of breath. Chest pain. Leg swelling.  EXAM: PORTABLE CHEST - 1 VIEW  COMPARISON:  Radiograph 06/03/2014  FINDINGS: Left-sided pacemaker overlies stable enlarged cardiac silhouette. No effusion, infiltrate, pneumothorax.  IMPRESSION: Cardiomegaly without acute findings .   Electronically Signed   By: Suzy Bouchard M.D.   On: 06/28/2014 00:00    Impression/Plan:  78 yo with worsening anemia and heme positive stool. Chronically black stools on iron pills. EGD tomorrow to look for peptic ulcer disease. NPO p MN. Supportive care. Dr.Edwards to f/u and do procedure tomorrow. Risks/benefits discussed and patient agrees to proceed.   LOS: 1 day   [place] C.  06/28/2014, 3:50 PM

## 2014-06-29 NOTE — Interval H&P Note (Signed)
History and Physical Interval Note:  06/29/2014 1:27 PM  SULIMAN TERMINI  has presented today for surgery, with the diagnosis of melena  The various methods of treatment have been discussed with the patient and family. After consideration of risks, benefits and other options for treatment, the patient has consented to  Procedure(s): ESOPHAGOGASTRODUODENOSCOPY (EGD) WITH PROPOFOL (N/A) as a surgical intervention .  The patient's history has been reviewed, patient examined, no change in status, stable for surgery.  I have reviewed the patient's chart and labs.  Questions were answered to the patient's satisfaction.     Trig Mcbryar JR,Zyara Riling L

## 2014-06-30 ENCOUNTER — Encounter (HOSPITAL_COMMUNITY): Payer: Self-pay | Admitting: Gastroenterology

## 2014-06-30 DIAGNOSIS — I480 Paroxysmal atrial fibrillation: Secondary | ICD-10-CM

## 2014-06-30 DIAGNOSIS — R7989 Other specified abnormal findings of blood chemistry: Secondary | ICD-10-CM

## 2014-06-30 DIAGNOSIS — I472 Ventricular tachycardia: Secondary | ICD-10-CM

## 2014-06-30 LAB — BASIC METABOLIC PANEL
Anion gap: 8 (ref 5–15)
BUN: 19 mg/dL (ref 6–23)
CO2: 26 mmol/L (ref 19–32)
Calcium: 8.5 mg/dL (ref 8.4–10.5)
Chloride: 102 mEq/L (ref 96–112)
Creatinine, Ser: 0.97 mg/dL (ref 0.50–1.35)
GFR, EST NON AFRICAN AMERICAN: 78 mL/min — AB (ref >90)
GLUCOSE: 108 mg/dL — AB (ref 70–99)
POTASSIUM: 4.6 mmol/L (ref 3.5–5.1)
SODIUM: 136 mmol/L (ref 135–145)

## 2014-06-30 LAB — GLUCOSE, CAPILLARY
GLUCOSE-CAPILLARY: 93 mg/dL (ref 70–99)
Glucose-Capillary: 103 mg/dL — ABNORMAL HIGH (ref 70–99)
Glucose-Capillary: 161 mg/dL — ABNORMAL HIGH (ref 70–99)
Glucose-Capillary: 206 mg/dL — ABNORMAL HIGH (ref 70–99)
Glucose-Capillary: 273 mg/dL — ABNORMAL HIGH (ref 70–99)
Glucose-Capillary: 82 mg/dL (ref 70–99)

## 2014-06-30 LAB — TROPONIN I
TROPONIN I: 0.53 ng/mL — AB (ref <0.031)
TROPONIN I: 0.82 ng/mL — AB (ref <0.031)

## 2014-06-30 LAB — CBC
HCT: 28 % — ABNORMAL LOW (ref 39.0–52.0)
HEMOGLOBIN: 8.9 g/dL — AB (ref 13.0–17.0)
MCH: 30.6 pg (ref 26.0–34.0)
MCHC: 31.8 g/dL (ref 30.0–36.0)
MCV: 96.2 fL (ref 78.0–100.0)
Platelets: 235 10*3/uL (ref 150–400)
RBC: 2.91 MIL/uL — ABNORMAL LOW (ref 4.22–5.81)
RDW: 16 % — AB (ref 11.5–15.5)
WBC: 7.1 10*3/uL (ref 4.0–10.5)

## 2014-06-30 MED ORDER — HYDROCORTISONE NA SUCCINATE PF 100 MG IJ SOLR
50.0000 mg | Freq: Once | INTRAMUSCULAR | Status: AC
  Administered 2014-06-30: 50 mg via INTRAVENOUS
  Filled 2014-06-30: qty 2

## 2014-06-30 MED ORDER — PEG 3350-KCL-NA BICARB-NACL 420 G PO SOLR
4000.0000 mL | Freq: Once | ORAL | Status: DC
  Filled 2014-06-30: qty 4000

## 2014-06-30 MED ORDER — SODIUM CHLORIDE 0.9 % IV SOLN
INTRAVENOUS | Status: AC
  Administered 2014-06-30: 18:00:00 via INTRAVENOUS

## 2014-06-30 MED ORDER — METOPROLOL TARTRATE 1 MG/ML IV SOLN
2.5000 mg | Freq: Four times a day (QID) | INTRAVENOUS | Status: DC
Start: 2014-06-30 — End: 2014-07-03
  Administered 2014-06-30 – 2014-07-03 (×10): 2.5 mg via INTRAVENOUS
  Filled 2014-06-30 (×14): qty 5

## 2014-06-30 NOTE — Progress Notes (Signed)
Patient went into A fib with RVR, lasted for a few minutes, resolved without intervention, currently paced rhythm, BP on the lower side, elevated troponins . 1- will give gentle IV fluids, NS 66ml/Hr X 5 hours. 2- one dose hydrocortisone ( stress dose as steroid dependent) 3- cancelled colonoscopy as unstable for procedure ( left arm doppler negative for DVT) , d/w Dr Oletta Lamas. 4-cardiology consulted.  Phillips Climes MD

## 2014-06-30 NOTE — Progress Notes (Signed)
PT Cancellation Note  Patient Details Name: [name] MRN: [id number] DOB: 1936/01/20   Cancelled Treatment:    Reason Eval/Treat Not Completed: Medical issues which prohibited therapy (Per Costella Hatcher, nurse, [facility] today due to pending doppler for possible right UE DVT.  Note plan is for pt to have colonoscopy tomorrow.  Will check back in am tomorrow.) Thanks.  Irwin Brakeman F 06/30/2014, 7:54 AM [facility] [phone] (pager)

## 2014-06-30 NOTE — Progress Notes (Signed)
VASCULAR LAB PRELIMINARY  PRELIMINARY  PRELIMINARY  PRELIMINARY  Left Upper Ext Venous Doppler completed.    Preliminary report: Negative DVT left upper extremity  Lindwood Coke, RVT 06/30/2014, 2:35 PM

## 2014-06-30 NOTE — Progress Notes (Signed)
[facility] PROGRESS NOTE Subjective Pt w/o problems no BM.   Objective: Vital signs in last 24 hours: Temp:  [97.9 F (36.6 C)-99.5 F (37.5 C)] 98 F (36.7 C) (12/29 0741) Pulse Rate:  [56-106] 101 (12/29 0741) Resp:  [14-24] 17 (12/29 0741) BP: (96-142)/(41-106) 98/59 mmHg (12/29 0741) SpO2:  [99 %-100 %] 99 % (12/29 0741)    Intake/Output from previous day: 12/28 0701 - 12/29 0700 In: 410 [P.O.:360; I.V.:50] Out: 1050 [Urine:1050] Intake/Output this shift:    PE: General--NAD  Lungs--clear Abdomen--snt good BSs  Lab Results:  Recent Labs  06/27/14 2314 06/28/14 1115 06/29/14 0503 06/30/14 0545  WBC 14.2* 8.3 6.6 7.1  HGB 7.0* 8.2*  8.1* 8.3* 8.9*  HCT 23.1* 25.5*  25.9* 26.5* 28.0*  PLT 312 210 222 235   BMET  Recent Labs  06/28/14 0152 06/28/14 0500 06/28/14 1905 06/29/14 0503 06/30/14 0545  NA 136 134* 132* 136 136  K 5.9* 5.4* 4.9 4.4 4.6  CL 99 99 98 102 102  CO2 29 31 28 26 26   CREATININE 1.61* 1.45* 1.13 0.89 0.97   LFT  Recent Labs  06/27/14 2314  PROT 6.2  AST 52*  ALT 34  ALKPHOS 79  BILITOT 0.4   PT/INR No results for input(s): LABPROT, INR in the last 72 hours. PANCREAS No results for input(s): LIPASE in the last 72 hours.       Studies/Results: No results found.  Medications: I have reviewed the patient's current medications.  Assessment/Plan: 1. + Stools/anemia. EGD negative had colon 2011 by Dr Wynetta Emery. Unfortunately with afib and ? DVT in arm may need anticoagulation. Therefore will plan on repeating colon before to assure nothing in colon, discussed with pt he has had procedure several times before. Will plan tomorrow with MAC at 8:30.   Zarianna Dicarlo JR,Bianca Raneri L 06/30/2014, 7:53 AM

## 2014-06-30 NOTE — Care Management Note (Signed)
Page 1 of 2   07/17/2014     4:58:58 PM CARE MANAGEMENT NOTE 07/17/2014  Patient:  [name], [name]   Account Number:  [id number]  Date Initiated:  06/30/2014  Documentation initiated by:  AMERSON,JULIE  Subjective/Objective Assessment:   Pt adm on 06/27/14 with anemia.  PTA, pt resides at home with spouse.     Action/Plan:   PT eval pending.  Will cont to follow for dc needs as pt progresses.   Anticipated DC Date:  07/17/2014   Anticipated DC Plan:  [place]  CM consult      [facility] Choice  Resumption Of Svcs/PTA Provider   Choice offered to / List presented to:  C-1 Patient   DME arranged  OTHER - SEE COMMENT      DME agency  [place] arranged  HH-1 RN  [place]      [place] agency  [facility]   Status of service:  In process, will continue to follow Medicare Important Message given?  YES (If response is "NO", the following Medicare IM given date fields will be blank) Date Medicare IM given:  06/30/2014 Medicare IM given by:  AMERSON,JULIE Date Additional Medicare IM given:  07/16/2014 Additional Medicare IM given by:  Marvetta Gibbons  Discharge Disposition:  [place]  Per UR Regulation:  Reviewed for med. necessity/level of care/duration of stay  If discussed at [place] of Stay Meetings, dates discussed:   07/07/2014    Comments:  07/17/14- 1500- Marvetta Gibbons RN, BSN [phone] Pt for potential d/c today, awaiting MD to round, - spoke with pt and wife at bedside- son has come from [place] to assist, they are ready to return home with [facility] services- have ordered [facility] lift- spoke with [place] with [facility] who is going to f/u with pt/wife- have also spoken with Stanton Kid[place] with [name]- they are ready to resume [facility] services and are awaiting d/c order. Per wife she plans to transport pt home in their private van. She has also arranged for some private duty help and  has # to hospice when they are ready to call them in.  07/13/14- 1200- Marvetta Gibbons RN, BSN [phone] F/U done with pt and wife regarding home with hospice- after several lengthy discussions about the different options for discharge- ([facility] for rehab vs home with [facility] vs home with Hospice) pt has decided that he wants to do more therapy at home with [facility] (he wants to try this one more time to see if he can progress- and if not then transistion to hospice at home) wife and he understand that they can stop [place] at anytime and call Hospice to transistion over to hospice at home- wife also plans to call in private duty assistance at home to help her care for pt- per conversation pt has home 02 already with [facility]- they have all needed DME at home at this time. - as of 1600- plan per pt and wife is to return home with [facility]- will need resumption orders for HH-RN/PT/OT/aide/CSW- (please include wound care orders)- pt will be on the [facility]-  07/12/14 17:30 Cm received call from RN who states pt and wife would like to discuss home hospice.  Palliative consult this am.  CM met with pt and wife [name] and gave [name] a list for choice.  CM also  gave [name] a list of Private Duty agencies for her to explore in the am.  Pt and sarah understand the PDL would be private pay.  [name] states she would like to think on this tonight and has arranged with CSW to talk tomorrow 1/11 of the option of residential hospice but understands pt will most likely NOT meed criteria.  CM will continue to monitor for choice. Mariane Masters, BSN, [place] [phone].  07/07/14- 1500- Marvetta Gibbons RN, BSN [phone] Pt active with [facility]- RN/PT/aide- will need resumption orders at time of discharge- family also would like a lift for home- MD please order DME- [facility] lift-  per [facility]- pt is a [place]  and will be followed closely at time of discharge by [facility].

## 2014-06-30 NOTE — Progress Notes (Signed)
Patient complaining of shortness of breath and chest pain.  Oxygen saturations 96 % 3liters heart rate up to 160's sustaining in the 140's.  Patient repositioned in bed, oxygen increased to 3.5 liters.  On telemetry heart rate elevated with failure to sense, ?Atrial fib RVR versus VTACH versus SVT.  Dr. Marvel Plan on floor and made aware.  Plan for cardiac consult.  Will continue to monitor.  Sanda Linger

## 2014-06-30 NOTE — Consult Note (Signed)
CARDIOLOGY CONSULT NOTE  Patient ID: [name], MRN: [id number], DOB/AGE: [birth year] 76 y.o. Admit date: 06/27/2014 Date of Consult: 06/30/2014  Primary Physician: Dorian Heckle, MD Primary Cardiologist: Dr Smith/Dr Lovena Le Referring Physician: Dr Waldron Labs  Chief Complaint: Weakness, shortness of breath  Reason for Consultation: Atrial fibrillation/tachycardia  HPI: 78 year old chronically ill gentleman who was hospitalized December 27 with shortness of breath. The patient has a long-standing history of severe ischemic cardiomyopathy with LVEF less than 30%. He has had multiple recent hospital admissions. The patient initially was treated with BiPAP and he had clinical improvement with this. His shortness of breath started rather suddenly and may have been associated with cardiac dysrhythmia as his heart rate was noted to be around 150 bpm per his wife. The patient was also noted to have a hemoglobin of 7.0 on admission. He has chronic anemia but this is worse than his baseline. He underwent endoscopy with no clear evidence of a bleeding source. Colonoscopy was planned, but he is not felt to be stable for this from a cardiopulmonary perspective. We are asked to see him because of episodes of tachycardia today.  The patient denies chest pain or shortness of breath today. However, he has had significant shortness of breath over recent weeks. His main problem has become progressive generalized weakness. He has developed a sacral decubitus ulcer. He is not able to get around very well at all anymore. He denies bright red blood in his stool. He denies syncope. He did have an ICD discharge earlier this month, but he was too weak to come in for evaluation. His device was interrogated at his home and this demonstrated an appropriate shock for VF after failed ATP. Apparently some adjustments to his ICD were made at that time.  Of note, the patient has a long history of atrial fibrillation. He has  not been on chronic anticoagulation because of problems related to anemia. He has previously been on amiodarone, but this was discontinued in 2010. I went back and reviewed several office notes from 2010. It appears that amiodarone was stopped because of lung toxicity. He had progressive shortness of breath and cough and was found to have elevated sedimentation rate at that time. Symptoms improved once amiodarone was discontinued.  Medical History:  Past Medical History  Diagnosis Date  . Hypokalemia   . PVD (peripheral vascular disease)   . HTN (hypertension)   . CHF (congestive heart failure)   . Ventricular tachycardia   . Ischemic heart disease, chronic   . Dyslipidemia   . CAD (coronary artery disease)   . On home oxygen therapy     "~ 2.5L; pretty much 24/7" (05/27/2014)  . Pacemaker   . Self-catheterizes urinary bladder     since back surgery.  6- 7 times a day. (05/27/2014)  . Difficult intubation     pt states "not aware of difficult intubation, never told"  . Heart murmur     dx 1974  . Shortness of breath   . Head injury, closed, with brief LOC 2013  . Neurogenic bladder     since back surgery - self caths  . Neurogenic bowel     digitally stimulates daily.  Marland Kitchen COPD (chronic obstructive pulmonary disease)      PT USES OXYGEN 24 HRS A DAY - 2 L NASAL CANNULA  . Emphysema   . Anemia     SERUM MONOCLONAL IgM KAPPA PROTEIN - NO CLINICAL EVIDENCE FOR PROGRESSION TO MULTIPLE MYELOMA OR OTHER  PROLIFERATIVE DISORDER - PER OFFICE NOTE DR. SHERRILL DATED 12/30/13  . Pneumonia X 2  . Borderline type 2 diabetes mellitus   . AICD (automatic cardioverter/defibrillator) present   . TIA (transient ischemic attack) 1980's    "they say I did; I don't believe it"  . Colon cancer   . MRSA infection       Surgical History:  Past Surgical History  Procedure Laterality Date  . Lumbar disc surgery  X 2  . Iliac artery stent Left   . Bi-ventricular implantable cardioverter  defibrillator  (crt-d)  04-10-08    implantation of Medtronic Concerto Bi-V ICD [; Cristopher Peru MD  . Colonoscopy    . Polypectomy    . Insertion of iliac stent Right 06/04/2012    external   . Cardiac catheterization    . Insert / replace / remove pacemaker    . Endarterectomy femoral  06/28/2012    Procedure: ENDARTERECTOMY FEMORAL;  Surgeon: Serafina Mitchell, MD;  Location: [place];  Service: Vascular;  Laterality: Left;  . Patch angioplasty  06/28/2012    Procedure: PATCH ANGIOPLASTY;  Surgeon: Serafina Mitchell, MD;  Location[facility] OR;  Service: Vascular;  Laterality: Left;  Using 0.8 cm x 15.2 cm Hemashield Patch.  . Colon surgery  03/2004  . Cataract extraction w/ intraocular lens  implant, bilateral Bilateral   . Cholecystectomy    . Coronary artery bypass graft  1998    x3 vessels  . Femoral-popliteal bypass graft Right 03/28/2013    Procedure: BYPASS GRAFT FEMORAL-BELOW KNEE POPLITEAL ARTERY Using 90m x 80 cm Propaten Graft ,With Right Common Femoral Artery Endarterectomy.;  Surgeon: VSerafina Mitchell MD;  Location: [facility] OR;  Service: Vascular;  Laterality: Right;  . Debridment of decubitus ulcer N/A 05/18/2014    Procedure: EXCISION AND CLOSURE OF DECUBITUS ULCER 5CM;  Surgeon: MPedro Earls MD;  Location: [facility] ORS;  Service: General;  Laterality: N/A;  . Back surgery    . Posterior lumbar fusion  X 1  . Abdominal aortagram N/A 02/21/2012    Procedure: ABDOMINAL AMaxcine Ham  Surgeon: VSerafina Mitchell MD;  [facility];  Service: Cardiovascular;  Laterality: N/A;  . Abdominal aortagram N/A 06/04/2012    Procedure: ABDOMINAL AORTAGRAM;  Surgeon: VSerafina Mitchell MD;  [facility];  Service: Cardiovascular;  Laterality: N/A;  . Biv icd genertaor change out N/A 10/22/2012    Procedure: BIV ICD GENERTAOR CHANGE OUT;  Surgeon: GEvans Lance MD;  [facility];  Service: Cardiovascular;  Laterality: N/A;  . Abdominal aortagram N/A 03/18/2013    Procedure: ABDOMINAL  AMaxcine Ham  Surgeon: VSerafina Mitchell MD;  [facility];  Service: Cardiovascular;  Laterality: N/A;  . Abdominal aortagram N/A 07/02/2013    Procedure: ABDOMINAL AORTAGRAM;  Surgeon: VSerafina Mitchell MD;  [facility];  Service: Cardiovascular;  Laterality: N/A;  . Lower extremity angiogram Bilateral 07/02/2013    Procedure: LOWER EXTREMITY ANGIOGRAM;  Surgeon: VSerafina Mitchell MD;  [facility];  Service: Cardiovascular;  Laterality: Bilateral;  . Percutaneous stent intervention Right 07/02/2013    Procedure: PERCUTANEOUS STENT INTERVENTION;  Surgeon: VSerafina Mitchell MD;  [facility];  Service: Cardiovascular;  Laterality: Right;  rt ext iliac stent  . Esophagogastroduodenoscopy (egd) with propofol N/A 06/29/2014    Procedure: ESOPHAGOGASTRODUODENOSCOPY (EGD) WITH PROPOFOL;  Surgeon: JWinfield Cunas, MD;  [facility];  Service: Endoscopy;  Laterality: N/A;  Home Meds: Prior to Admission medications   Medication Sig Start Date End Date Taking? Authorizing Provider  acetaminophen (TYLENOL) 325 MG tablet Take 2 tablets (650 mg total) by mouth every 4 (four) hours as needed for headache or mild pain. 08/09/13  Yes Luke K Kilroy, PA-C  albuterol (PROVENTIL HFA;VENTOLIN HFA) 108 (90 BASE) MCG/ACT inhaler Inhale 2 puffs into the lungs every 6 (six) hours as needed for wheezing or shortness of breath.  10/02/12  Yes Kathee Delton, MD  albuterol (PROVENTIL) (2.5 MG/3ML) 0.083% nebulizer solution Take 2.5 mg by nebulization 4 (four) times daily.    Yes Historical Provider, MD  ALPRAZolam (XANAX) 0.25 MG tablet Take 1 tablet (0.25 mg total) by mouth 2 (two) times daily as needed for anxiety (sob). 06/09/14  Yes Costin Karlyne Greenspan, MD  aspirin EC 325 MG tablet Take 325 mg by mouth daily.   Yes Historical Provider, MD  budesonide (PULMICORT) 0.5 MG/2ML nebulizer solution Take 0.5 mg by nebulization 2 (two) times daily.    Yes Historical Provider, MD    candesartan (ATACAND) 32 MG tablet Take 32 mg by mouth daily.   Yes Historical Provider, MD  digoxin (LANOXIN) 0.125 MG tablet Take 0.125 mg by mouth every morning.   Yes Historical Provider, MD  diltiazem (CARDIZEM CD) 180 MG 24 hr capsule Take 1 capsule (180 mg total) by mouth daily. 06/09/14  Yes Costin Karlyne Greenspan, MD  ferrous sulfate 325 (65 FE) MG tablet Take 325 mg by mouth at bedtime.    Yes Historical Provider, MD  furosemide (LASIX) 40 MG tablet TAKE 2 TABLETS IN THE MORNING AND 1 TABLET IN THE EVENING 04/23/14  Yes Kathee Delton, MD  GLUCERNA (GLUCERNA) LIQD Take 237 mLs by mouth 3 (three) times daily between meals.   Yes Historical Provider, MD  guaiFENesin (MUCINEX) 600 MG 12 hr tablet Take 600 mg by mouth 2 (two) times daily. congestion   Yes Historical Provider, MD  Hydrocodone-Acetaminophen 5-300 MG TABS Take 1 tablet by mouth every 6 (six) hours as needed (pain).   Yes Historical Provider, MD  irbesartan (AVAPRO) 150 MG tablet Take 1 tablet (150 mg total) by mouth daily. 06/09/14  Yes Costin Karlyne Greenspan, MD  KLOR-CON M20 20 MEQ tablet TAKE 1 TABLET DAILY 02/17/14  Yes Kathee Delton, MD  megestrol (MEGACE) 20 MG tablet Take 1 tablet (20 mg total) by mouth 2 (two) times daily. 06/09/14  Yes Costin Karlyne Greenspan, MD  mupirocin ointment (BACTROBAN) 2 % Apply 1 application topically 2 (two) times daily as needed (Cuts and bruises).   Yes Historical Provider, MD  nitroGLYCERIN (NITRODUR - DOSED IN MG/24 HR) 0.2 mg/hr patch Place 1 patch onto the skin daily.   Yes Historical Provider, MD  nitroGLYCERIN (NITROSTAT) 0.4 MG SL tablet Place 0.4 mg under the tongue every 5 (five) minutes as needed for chest pain.   Yes Historical Provider, MD  predniSONE (DELTASONE) 10 MG tablet Take 1 tablet (10 mg total) by mouth daily. Take 20 mg (2 tablets) for 4 days then return to 10 mg daily Patient taking differently: Take 10 mg by mouth daily.  06/09/14  Yes Costin Karlyne Greenspan, MD  PRESCRIPTION MEDICATION Uses  1L-2L of home Oxygen   Yes Historical Provider, MD  simvastatin (ZOCOR) 20 MG tablet Take 20 mg by mouth at bedtime.    Yes Historical Provider, MD  tiotropium (SPIRIVA) 18 MCG inhalation capsule Place 18 mcg into inhaler and inhale daily.    Yes  Historical Provider, MD  aspirin EC 325 MG tablet Take 325 mg by mouth at bedtime.    Historical Provider, MD  collagenase (SANTYL) ointment Apply topically 2 (two) times daily. Patient not taking: Reported on 06/28/2014 06/02/14   Orson Eva, MD  feeding supplement, ENSURE COMPLETE, (ENSURE COMPLETE) LIQD Take 237 mLs by mouth 2 (two) times daily between meals. Patient not taking: Reported on 06/28/2014 05/29/14   Orson Eva, MD  HYDROcodone-acetaminophen (VICODIN) 2.5-500 MG per tablet Take 1 tablet by mouth every 6 (six) hours as needed for pain.    Historical Provider, MD  levofloxacin (LEVAQUIN) 500 MG tablet Take 1 tablet (500 mg total) by mouth daily. Patient not taking: Reported on 06/28/2014 06/09/14   Caren Griffins, MD  PROVENTIL HFA 108 (90 BASE) MCG/ACT inhaler USE 1 TO 2 INHALATIONS INTO THE LUNGS EVERY 6 HOURS AS NEEDED FOR SHORTNESS OF BREATH Patient not taking: Reported on 06/28/2014 06/24/14   Kathee Delton, MD    Inpatient Medications:  . budesonide  0.5 mg Nebulization BID  . Chlorhexidine Gluconate Cloth  6 each Topical Q0600  . diltiazem  30 mg Oral 3 times per day  . guaiFENesin  600 mg Oral BID  . insulin aspart  0-9 Units Subcutaneous 6 times per day  . mupirocin ointment  1 application Nasal BID  . pantoprazole (PROTONIX) IV  40 mg Intravenous Q12H  . predniSONE  10 mg Oral Q breakfast  . tiotropium  18 mcg Inhalation Daily   . sodium chloride 75 mL/hr at 06/30/14 1745    Allergies: No Known Allergies  History   Social History  . Marital Status: Married    Spouse Name: N/A    Number of Children: N/A  . Years of Education: N/A   Occupational History  .      Retired Social research officer, government   Social History Main Topics  .  Smoking status: Former Smoker -- 3.00 packs/day for 48 years    Types: Cigarettes    Quit date: 07/03/2002  . Smokeless tobacco: Never Used  . Alcohol Use: Yes     Comment: 05/27/2014 "a beer q 6 months or so"  . Drug Use: No  . Sexual Activity: No   Other Topics Concern  . Not on file   Social History Narrative   Married, wife [name]   Oxygen-continuous   I/O self cath since back surgery     Family History  Problem Relation Age of Onset  . Heart failure Father   . Diabetes Father   . Heart disease Father   . Hyperlipidemia Father   . Hypertension Father   . Heart disease Mother     before age 51  . Hyperlipidemia Mother   . Hypertension Mother      Review of Systems: General: negative for chills, fever, night sweats. Positive for weight loss and generalized weakness ENT: negative for rhinorrhea or epistaxis Cardiovascular: Positive for exertional dyspnea. Negative for chest pain. Dermatological: negative for rash Respiratory: negative for cough or wheezing GI: negative for nausea, vomiting, diarrhea, bright red blood per rectum, melena, or hematemesis GU: no hematuria, urgency, or frequency Neurologic: negative for visual changes, syncope, headache, or dizziness Heme: Positive for easy bruising Endo: negative for excessive thirst, thyroid disorder, or flushing Musculoskeletal: negative for joint pain or swelling, negative for myalgias  All other systems reviewed and are otherwise negative except as noted above.  Physical Exam: Blood pressure 90/41, pulse 92, temperature 97.5  F (36.4 C), temperature source Oral, resp. rate 19, height _0  (1.702 m), weight 139 lb (63.05 kg), SpO2 99 %. Pt is alert and oriented, very frail-appearing elderly male in no distress HEENT: normal Neck: JVP normal. Carotid upstrokes normal without bruits. No thyromegaly. Lungs: equal expansion, diminished air movement with prolonged expiration bilaterally CV: Apex is  discrete and nondisplaced, RRR without murmur or gallop Abd: soft, NT, +BS, no bruit, no hepatosplenomegaly Back: no CVA tenderness Ext: no C/C/E Skin: warm and dry without rash, areas of ecchymoses noted on the legs Neuro: CNII-XII intact             Strength intact = bilaterally    Labs:  Recent Labs  06/27/14 2314 06/30/14 1448  TROPONINI 0.49* 0.53*   Lab Results  Component Value Date   WBC 7.1 06/30/2014   HGB 8.9* 06/30/2014   HCT 28.0* 06/30/2014   MCV 96.2 06/30/2014   PLT 235 06/30/2014    Recent Labs Lab 06/27/14 2314  06/30/14 0545  NA 137  < > 136  K 6.2*  < > 4.6  CL 98  < > 102  CO2 24  < > 26  BUN 39*  < > 19  CREATININE 1.64*  < > 0.97  CALCIUM 8.8  < > 8.5  PROT 6.2  --   --   BILITOT 0.4  --   --   ALKPHOS 79  --   --   ALT 34  --   --   AST 52*  --   --   GLUCOSE 304*  < > 108*  < > = values in this interval not displayed. Lab Results  Component Value Date   CHOL 99 07/21/2013   HDL 37.70* 07/21/2013   LDLCALC 49 07/21/2013   TRIG 64.0 07/21/2013   Lab Results  Component Value Date   DDIMER * 12/17/2006    1.41        AT THE INHOUSE ESTABLISHED CUTOFF VALUE OF 0.48 ug/mL FEU, THIS ASSAY HAS BEEN DOCUMENTED IN THE LITERATURE TO HAVE    Radiology/Studies:  Dg Chest Port 1 View  06/28/2014   CLINICAL DATA:  Constant short of breath. Chest pain. Leg swelling.  EXAM: PORTABLE CHEST - 1 VIEW  COMPARISON:  Radiograph 06/03/2014  FINDINGS: Left-sided pacemaker overlies stable enlarged cardiac silhouette. No effusion, infiltrate, pneumothorax.  IMPRESSION: Cardiomegaly without acute findings .   Electronically Signed   By: Suzy Bouchard M.D.   On: 06/28/2014 00:00   Dg Chest Port 1 View  06/03/2014   CLINICAL DATA:  Shortness of breath  EXAM: PORTABLE CHEST - 1 VIEW  COMPARISON:  Portable chest x-ray of 05/29/2014  FINDINGS: The lungs are clear but slightly hyperaerated. Mediastinal and hilar contours are unchanged. The heart is enlarged  and stable with AICD leads noted.  IMPRESSION: Stable cardiomegaly.  No active lung disease.   Electronically Signed   By: Ivar Drape M.D.   On: 06/03/2014 13:45    EKG: Atrial fibrillation with RVR, left bundle branch block  Cardiac Studies: 2D Echo 08/08/2013: Study Conclusions  - Left ventricle: The cavity size was severely dilated. Systolic function was severely reduced. The estimated ejection fraction was in the range of 20% to 25%. Diffuse hypokinesis. - Mitral valve: Moderate to severe regurgitation. - Left atrium: The atrium was moderately dilated. - Right ventricle: Systolic function was moderately reduced. - Right atrium: The atrium was mildly dilated. - Pulmonary arteries: Systolic pressure was mildly increased.  PA peak pressure: 96m Hg (S).  ASSESSMENT AND PLAN:  1. Paroxysmal atrial fibrillation with RVR 2. Nonsustained ventricular tachycardia on telemetry review 3. Severe ischemic cardiomyopathy with LVEF approximately 20% 4. Elevated troponin, suspect demand ischemia 5. Symptomatic anemia 6. Severe COPD  The patient appears to have limited treatment options. He seems to be declining significantly. I don't think that he will be a candidate for amiodarone because of previous lung toxicity and very limited pulmonary reserve. He appears to have episodes of atrial fibrillation with RVR and also episodes of ventricular tachycardia with a recent ICD shock. Will ask for his ICD to be interrogated. Will also ask for a formal EP consultation with Dr. TLovena Le I am going to stop his diltiazem and start him back on low-dose IV metoprolol. He is not actively wheezing on exam and I'm hopeful that he will be able to tolerate this. I agree that he is not stable for colonoscopy at this time.  Signed, MSherren MochaMD, FAvera [facility]12/29/2015, 6:36 PM

## 2014-06-30 NOTE — Progress Notes (Addendum)
Patient Demographics  [name], is a 78 y.o. male, DOB - [birth year], IPJ:[id number]  Admit date - 06/27/2014   Admitting Physician Theressa Millard, MD  Outpatient Primary MD for the patient is Dorian Heckle, MD  LOS - 3   Chief Complaint  Patient presents with  . Respiratory Distress       Admission history of present illness/brief narrative:  [name] is a 78 y.o. male with a history of CAD, Ischemic Cardiomyopathy, Systolic CHF, COPD, Chronic Respiratory Failure , HTN , Hyperlipidemia, DM2, Remote history of Colon Cancer who presents to the ED with complaints of SOB and Chest pain . Shortness of breath was transient and improved after being placed on BiPAP in the ER. BP was 734 systolic HR 193-790.   He was found to be anemic with a hemoglobin of 7.0 dropping from 8.8 a few weeks ago. He does admit to black stools but he is on iron. Hemoccult was positive. He was recently hospitalized with a COPD exacerbation and continues to take 10 mg of prednisone daily.  Subjective:   [name] today has, No headache, No chest pain, No abdominal pain - No Nausea, No new weakness tingling or numbness, No Cough - SOB.   Assessment & Plan    Principal Problem:   Symptomatic anemia Active Problems:   Coronary atherosclerosis   Cardiomyopathy, ischemic- EF 20-25% 2D 08/08/13   COPD (chronic obstructive pulmonary disease) with emphysema   Chronic systolic heart failure   Decubitus ulcer of coccygeal region-excision and closure Nov 2015   Hyperkalemia   SOB (shortness of breath)   Sinus tachycardia   Hypotension   AKI (acute kidney injury)  Symptomatic anemia -As been transfused 2 units of packed red blood cells -Patient status post endoscopy 12/28, with no evidence of active bleed, has left arm swelling suspicious for DVT, still awaiting venous  Doppler, but patient needs workup including colonoscopy to clear him for anticoagulation, so gratefully gastroenterology will proceed with colonoscopy tomorrow morning. -Repeat hemoglobin is 8.9Today. - We'll check anemia panel in am  - Continue with PPI, continue to taper steroids.  Hemoccult positive -Continue twice a day PPI and hold aspirin - post endoscopy 12/28, only significant for hiatal hernia, with no evidence of active bleed or gastritis or ulcer.   Coronary atherosclerosis - mildly elevated troponins, will continue to cycle. - Troponins most likely elevated to demand ischemia as patient presented initially with A. fib with RVR, currently denies any chest pain.  Left arm swelling -Check venous Doppler to rule out DVT.   Cardiomyopathy, ischemic- EF 20-25% 2D 08/08/13 - given 1 dose of furosemide in between packed red blood cells  -Hold further furosemide for now - at baseline O2 requirements  -Continue digoxin   A-fib - developed RVR around  requiring  low dose IV Metoprolol  - will resume low dose short acting Cardizem  - not on anticoagulation other than ASA which is on hold due to GI bleed  S/p Bi-V pacer Hypotension Hold Avapro- Cardizem being resumed at low dose  COPD (chronic obstructive pulmonary disease) with emphysema - on Chronic O2 at 2.5 L -Continue 10 mg of prednisone daily and nebulizer treatments along with Spiriva -Continue Mucinex  Decubitus ulcer of coccygeal region-excision and closure Nov 2015 -Wound care eval completed   Hyperkalemia -Resolved    AKI (acute kidney injury) -Hold diuretics and follow -Resolved   Diabetes mellitus -Low-dose sliding scale for now - Abdomen A1c is 7.9     Code Status: full   Family Communication: none at bedside   Disposition Plan: remains on telemetry    Procedures  Endoscopy 12/28 -2 units packed red blood cell transfusion 12/27   Consults  Gastroenterology     Medications  Scheduled Meds: . budesonide  0.5 mg Nebulization BID  . Chlorhexidine Gluconate Cloth  6 each Topical Q0600  . diltiazem  30 mg Oral 3 times per day  . guaiFENesin  600 mg Oral BID  . insulin aspart  0-9 Units Subcutaneous 6 times per day  . mupirocin ointment  1 application Nasal BID  . pantoprazole (PROTONIX) IV  40 mg Intravenous Q12H  . polyethylene glycol-electrolytes  4,000 mL Oral Once  . predniSONE  10 mg Oral Q breakfast  . tiotropium  18 mcg Inhalation Daily   Continuous Infusions:  PRN Meds:.acetaminophen **OR** acetaminophen, ALPRAZolam, alum & mag hydroxide-simeth, HYDROcodone-acetaminophen, HYDROmorphone (DILAUDID) injection, levalbuterol, ondansetron **OR** ondansetron (ZOFRAN) IV, oxyCODONE  DVT Prophylaxis   SCDs   Lab Results  Component Value Date   PLT 235 06/30/2014    Antibiotics    Anti-infectives    None          Objective:   Filed Vitals:   06/30/14 0400 06/30/14 0741 06/30/14 0849 06/30/14 1114  BP: 115/53 98/59  115/68  Pulse: 94 101  115  Temp: 98.2 F (36.8 C) 98 F (36.7 C)  98.6 F (37 C)  TempSrc: Oral Oral  Oral  Resp: 14 17  18   Height:      Weight:      SpO2: 99% 99% 98% 94%    Wt Readings from Last 3 Encounters:  06/27/14 63.05 kg (139 lb)  06/08/14 63.277 kg (139 lb 8 oz)  05/30/14 63.504 kg (140 lb)     Intake/Output Summary (Last 24 hours) at 06/30/14 1347 Last data filed at 06/30/14 0400  Gross per 24 hour  Intake    410 ml  Output   1050 ml  Net   -640 ml     Physical Exam  Awake Alert, Oriented X 3, No new F.N deficits, Normal affect [place].AT,PERRAL Supple Neck,No JVD, No cervical lymphadenopathy appriciated.  Symmetrical Chest wall movement, Good air movement bilaterally, CTAB RRR,No Gallops,Rubs or new Murmurs, No Parasternal Heave +ve B.Sounds, Abd Soft, No tenderness, No organomegaly appriciated, No rebound - guarding or rigidity. No Cyanosis, Clubbing or edema, No new Rash or  bruise , has stage IV sacral decubitus ulcer , left arm edema .   Data Review   Micro Results Recent Results (from the past 240 hour(s))  MRSA PCR Screening     Status: Abnormal   Collection Time: 06/28/14  8:12 AM  Result Value Ref Range Status   MRSA by PCR POSITIVE (A) NEGATIVE Final    Comment:        The GeneXpert MRSA Assay (FDA approved for NASAL specimens only), is one component of a comprehensive MRSA colonization surveillance program. It is not intended to diagnose MRSA infection nor to guide or monitor treatment for MRSA infections. RESULT CALLED TO, READ BACK BY AND VERIFIED WITH: BUENDIA,R RN 06/28/14 [place]     Radiology Reports No results found.  CBC  Recent Labs Lab  06/27/14 2314 06/28/14 1115 06/29/14 0503 06/30/14 0545  WBC 14.2* 8.3 6.6 7.1  HGB 7.0* 8.2*  8.1* 8.3* 8.9*  HCT 23.1* 25.5*  25.9* 26.5* 28.0*  PLT 312 210 222 235  MCV 98.3 94.4 93.0 96.2  MCH 29.8 30.4 29.1 30.6  MCHC 30.3 32.2 31.3 31.8  RDW 16.2* 16.5* 16.3* 16.0*  LYMPHSABS 2.0  --   --   --   MONOABS 1.1*  --   --   --   EOSABS 0.1  --   --   --   BASOSABS 0.0  --   --   --     Chemistries   Recent Labs Lab 06/27/14 2314 06/28/14 0152 06/28/14 0500 06/28/14 1905 06/29/14 0503 06/30/14 0545  NA 137 136 134* 132* 136 136  K 6.2* 5.9* 5.4* 4.9 4.4 4.6  CL 98 99 99 98 102 102  CO2 24 29 31 28 26 26   GLUCOSE 304* 282* 158* 184* 94 108*  BUN 39* 41* 40* 33* 27* 19  CREATININE 1.64* 1.61* 1.45* 1.13 0.89 0.97  CALCIUM 8.8 8.8 8.5 8.1* 8.4 8.5  AST 52*  --   --   --   --   --   ALT 34  --   --   --   --   --   ALKPHOS 79  --   --   --   --   --   BILITOT 0.4  --   --   --   --   --    ------------------------------------------------------------------------------------------------------------------ estimated creatinine clearance is 57.8 mL/min (by C-G formula based on Cr of  0.97). ------------------------------------------------------------------------------------------------------------------  Recent Labs  06/28/14 1115  HGBA1C 7.9*   ------------------------------------------------------------------------------------------------------------------ No results for input(s): CHOL, HDL, LDLCALC, TRIG, CHOLHDL, LDLDIRECT in the last 72 hours. ------------------------------------------------------------------------------------------------------------------ No results for input(s): TSH, T4TOTAL, T3FREE, THYROIDAB in the last 72 hours.  Invalid input(s): FREET3 ------------------------------------------------------------------------------------------------------------------ No results for input(s): VITAMINB12, FOLATE, FERRITIN, TIBC, IRON, RETICCTPCT in the last 72 hours.  Coagulation profile No results for input(s): INR, PROTIME in the last 168 hours.  No results for input(s): DDIMER in the last 72 hours.  Cardiac Enzymes  Recent Labs Lab 06/27/14 2314  TROPONINI 0.49*   ------------------------------------------------------------------------------------------------------------------ Invalid input(s): POCBNP     Time Spent in minutes   30 minutes    Davonte Siebenaler M.D on 06/30/2014 at 1:47 PM  Between 7am to 7pm - Pager - [phone]  After 7pm go to [email or web address] - password [email or web address]  And look for the night coverage person covering for me after hours  [facility] Office  [phone]   **Disclaimer: This note may have been dictated with voice recognition software. Similar sounding words can inadvertently be transcribed and this note may contain transcription errors which may not have been corrected upon publication of note.**

## 2014-07-01 ENCOUNTER — Inpatient Hospital Stay (HOSPITAL_COMMUNITY): Payer: Medicare Other

## 2014-07-01 ENCOUNTER — Encounter (HOSPITAL_COMMUNITY): Payer: Self-pay | Admitting: *Deleted

## 2014-07-01 ENCOUNTER — Encounter (HOSPITAL_COMMUNITY)
Admission: EM | Disposition: A | Discharge status: Home Health | Payer: Self-pay | Source: Home / Self Care | Attending: Internal Medicine

## 2014-07-01 DIAGNOSIS — I9589 Other hypotension: Secondary | ICD-10-CM

## 2014-07-01 DIAGNOSIS — J810 Acute pulmonary edema: Secondary | ICD-10-CM

## 2014-07-01 DIAGNOSIS — I5043 Acute on chronic combined systolic (congestive) and diastolic (congestive) heart failure: Secondary | ICD-10-CM

## 2014-07-01 DIAGNOSIS — I25110 Atherosclerotic heart disease of native coronary artery with unstable angina pectoris: Secondary | ICD-10-CM

## 2014-07-01 DIAGNOSIS — I482 Chronic atrial fibrillation: Secondary | ICD-10-CM

## 2014-07-01 LAB — TROPONIN I: Troponin I: 0.87 ng/mL (ref <0.031)

## 2014-07-01 LAB — GLUCOSE, CAPILLARY
GLUCOSE-CAPILLARY: 121 mg/dL — AB (ref 70–99)
GLUCOSE-CAPILLARY: 125 mg/dL — AB (ref 70–99)
GLUCOSE-CAPILLARY: 231 mg/dL — AB (ref 70–99)
Glucose-Capillary: 109 mg/dL — ABNORMAL HIGH (ref 70–99)
Glucose-Capillary: 217 mg/dL — ABNORMAL HIGH (ref 70–99)
Glucose-Capillary: 209 mg/dL — ABNORMAL HIGH (ref 70–99)
Glucose-Capillary: 220 mg/dL — ABNORMAL HIGH (ref 70–99)

## 2014-07-01 LAB — VITAMIN B12: Vitamin B-12: 747 pg/mL (ref 211–911)

## 2014-07-01 LAB — BASIC METABOLIC PANEL
ANION GAP: 7 (ref 5–15)
BUN: 18 mg/dL (ref 6–23)
CALCIUM: 8.3 mg/dL — AB (ref 8.4–10.5)
CO2: 28 mmol/L (ref 19–32)
CREATININE: 0.88 mg/dL (ref 0.50–1.35)
Chloride: 103 mEq/L (ref 96–112)
GFR calc Af Amer: >90 mL/min (ref >90)
GFR calc non Af Amer: 81 mL/min — ABNORMAL LOW (ref >90)
Glucose, Bld: 141 mg/dL — ABNORMAL HIGH (ref 70–99)
Potassium: 4.5 mmol/L (ref 3.5–5.1)
Sodium: 138 mmol/L (ref 135–145)

## 2014-07-01 LAB — CBC
HCT: 26.6 % — ABNORMAL LOW (ref 39.0–52.0)
Hemoglobin: 8.3 g/dL — ABNORMAL LOW (ref 13.0–17.0)
MCH: 29.1 pg (ref 26.0–34.0)
MCHC: 31.2 g/dL (ref 30.0–36.0)
MCV: 93.3 fL (ref 78.0–100.0)
PLATELETS: 219 10*3/uL (ref 150–400)
RBC: 2.85 MIL/uL — ABNORMAL LOW (ref 4.22–5.81)
RDW: 15.7 % — ABNORMAL HIGH (ref 11.5–15.5)
WBC: 7.2 10*3/uL (ref 4.0–10.5)

## 2014-07-01 LAB — IRON AND TIBC
Iron: 24 ug/dL — ABNORMAL LOW (ref 42–165)
Saturation Ratios: 15 % — ABNORMAL LOW (ref 20–55)
TIBC: 161 ug/dL — ABNORMAL LOW (ref 215–435)
UIBC: 137 ug/dL (ref 125–400)

## 2014-07-01 LAB — RETICULOCYTES
RBC.: 2.85 MIL/uL — ABNORMAL LOW (ref 4.22–5.81)
RETIC COUNT ABSOLUTE: 88.4 10*3/uL (ref 19.0–186.0)
RETIC CT PCT: 3.1 % (ref 0.4–3.1)

## 2014-07-01 LAB — FERRITIN: FERRITIN: 972 ng/mL — AB (ref 22–322)

## 2014-07-01 LAB — FOLATE: Folate: >20 ng/mL

## 2014-07-01 SURGERY — COLONOSCOPY
Anesthesia: Monitor Anesthesia Care

## 2014-07-01 MED ORDER — LEVALBUTEROL HCL 0.63 MG/3ML IN NEBU
0.6300 mg | INHALATION_SOLUTION | Freq: Four times a day (QID) | RESPIRATORY_TRACT | Status: DC
  Administered 2014-07-01 – 2014-07-04 (×15): 0.63 mg via RESPIRATORY_TRACT
  Filled 2014-07-01 (×25): qty 3

## 2014-07-01 MED ORDER — DIGOXIN 125 MCG PO TABS: 0.1250 mg | ORAL_TABLET | Freq: Every day | ORAL | Status: DC

## 2014-07-01 MED ORDER — FUROSEMIDE 10 MG/ML IJ SOLN
INTRAMUSCULAR | Status: AC
  Filled 2014-07-01: qty 4

## 2014-07-01 MED ORDER — DIGOXIN 0.25 MG/ML IJ SOLN
0.1250 mg | Freq: Once | INTRAMUSCULAR | Status: AC
Start: 2014-07-01 — End: 2014-07-01
  Administered 2014-07-01: 0.125 mg via INTRAVENOUS
  Filled 2014-07-01: qty 0.5

## 2014-07-01 MED ORDER — FUROSEMIDE 10 MG/ML IJ SOLN
40.0000 mg | Freq: Once | INTRAMUSCULAR | Status: AC
Start: 2014-07-01 — End: 2014-07-01
  Administered 2014-07-01: 40 mg via INTRAVENOUS

## 2014-07-01 MED ORDER — PANTOPRAZOLE SODIUM 40 MG PO TBEC
40.0000 mg | DELAYED_RELEASE_TABLET | Freq: Two times a day (BID) | ORAL | Status: DC
  Administered 2014-07-01 – 2014-07-17 (×33): 40 mg via ORAL
  Filled 2014-07-01 (×32): qty 1

## 2014-07-01 MED ORDER — DIGOXIN 0.25 MG/ML IJ SOLN
0.2500 mg | Freq: Once | INTRAMUSCULAR | Status: AC
  Administered 2014-07-01: 0.25 mg via INTRAVENOUS
  Filled 2014-07-01: qty 1

## 2014-07-01 NOTE — Progress Notes (Signed)
Patient Name: [name] Date of Encounter: 07/01/2014  Principal Problem:   Symptomatic anemia Active Problems:   Coronary atherosclerosis   Cardiomyopathy, ischemic- EF 20-25% 2D 08/08/13   COPD (chronic obstructive pulmonary disease) with emphysema   Chronic systolic heart failure   Decubitus ulcer of coccygeal region-excision and closure Nov 2015   Hyperkalemia   SOB (shortness of breath)   Sinus tachycardia   Hypotension   AKI (acute kidney injury)   Length of Stay: 4  SUBJECTIVE  Asked to see urgently due to deteriorating respiratory status and chest pain at rest. Complicated clinical history reviewed. In AF with RVR, 155 bpm, RR>30, speaking coherently, but in interrupted sentences.  CURRENT MEDS . budesonide  0.5 mg Nebulization BID  . Chlorhexidine Gluconate Cloth  6 each Topical Q0600  . digoxin  0.125 mg Intravenous Once  . digoxin  0.125 mg Oral Daily  . furosemide  40 mg Intravenous Once  . guaiFENesin  600 mg Oral BID  . insulin aspart  0-9 Units Subcutaneous 6 times per day  . levalbuterol  0.63 mg Nebulization Q6H  . metoprolol  2.5 mg Intravenous 4 times per day  . mupirocin ointment  1 application Nasal BID  . pantoprazole  40 mg Oral BID  . predniSONE  10 mg Oral Q breakfast  . tiotropium  18 mcg Inhalation Daily    OBJECTIVE   Intake/Output Summary (Last 24 hours) at 07/01/14 1008 Last data filed at 07/01/14 0015  Gross per 24 hour  Intake    450 ml  Output    825 ml  Net   -375 ml   Filed Weights   06/27/14 2310 07/01/14 0400  Weight: 139 lb (63.05 kg) 143 lb 12.8 oz (65.227 kg)    PHYSICAL EXAM Filed Vitals:   07/01/14 0000 07/01/14 0400 07/01/14 0724 07/01/14 0923  BP: 98/68 105/44 122/56   Pulse: 116 107 89   Temp: 98.2 F (36.8 C) 98.4 F (36.9 C) 98.6 F (37 C)   TempSrc: Oral Oral Oral   Resp: 20 20 19    Height:      Weight:  143 lb 12.8 oz (65.227 kg)    SpO2: 99% 100% 100% 99%   General: Alert, oriented x3,  moderate distress Head: no evidence of trauma, PERRL, EOMI, no exophtalmos or lid lag, no myxedema, no xanthelasma; normal ears, nose and oropharynx Neck: normal jugular venous pulsations and no hepatojugular reflux; brisk carotid pulses without delay and no carotid bruits Chest: bilateral wet rales 1/2 way up. Cardiovascular: displaced and effeced heaving apical impulse, irregular rhythm, normal first and split loud second heart sounds Abdomen: no tenderness or distention, no masses by palpation, no abnormal pulsatility or arterial bruits, normal bowel sounds, no hepatosplenomegaly Extremities: no clubbing, cyanosis or edema; 2+ radial, ulnar and brachial pulses bilaterally; 2+ right femoral, posterior tibial and dorsalis pedis pulses; 2+ left femoral, posterior tibial and dorsalis pedis pulses; no subclavian or femoral bruits Neurological: grossly nonfocal  LABS  CBC  Recent Labs  06/30/14 0545 07/01/14 0156  WBC 7.1 7.2  HGB 8.9* 8.3*  HCT 28.0* 26.6*  MCV 96.2 93.3  PLT 235 782   Basic Metabolic Panel  Recent Labs  06/30/14 0545 07/01/14 0156  NA 136 138  K 4.6 4.5  CL 102 103  CO2 26 28  GLUCOSE 108* 141*  BUN 19 18  CREATININE 0.97 0.88  CALCIUM 8.5 8.3*   Liver Function Tests No results for input(s): AST, ALT,  ALKPHOS, BILITOT, PROT, ALBUMIN in the last 72 hours. No results for input(s): LIPASE, AMYLASE in the last 72 hours. Cardiac Enzymes  Recent Labs  06/30/14 1448 06/30/14 1946 07/01/14 0151  TROPONINI 0.53* 0.82* 0.87*   BNP Invalid input(s): POCBNP D-Dimer No results for input(s): DDIMER in the last 72 hours. Hemoglobin A1C  Recent Labs  06/28/14 1115  HGBA1C 7.9*   Fasting Lipid Panel No results for input(s): CHOL, HDL, LDLCALC, TRIG, CHOLHDL, LDLDIRECT in the last 72 hours. Thyroid Function Tests No results for input(s): TSH, T4TOTAL, T3FREE, THYROIDAB in the last 72 hours.  Invalid input(s): [place]  Radiology Studies Imaging results  have been reviewed and No results found.  TELE AF with RVR, LBBB  ECG same  ASSESSMENT AND PLAN  Critically ill with acute pulmonary edema due to underlying cardiomyopathy decompensated due to AF with RVR and loss of CRT, in turn possibly related to anemia and acute respiratory illness. Transfer to ICU. Code status reviewed - he wants intubation and mechanical ventilation if they are temporary rescue measures, but not long term support. Restart digoxin, stopped 12/26. Beta blocker recently added, dose increase limited by hypotension. Amiodarone reportedly caused lung toxicity and was stopped in 2010. Ultimately, needs AVN ablation as only reliable way to avoid RVR and loss of CRT. IV diuretics. Transfusion should be considered, after some diuresis. Prognosis is poor. Discussed with patient and family.   Sanda Klein, MD, [facility] [phone] office [phone] pager 07/01/2014 10:08 AM

## 2014-07-01 NOTE — Progress Notes (Signed)
[facility] PROGRESS NOTE Subjective Colon cancelled due to progressive cardiac issued. Now with Afib with RVR  Objective: Vital signs in last 24 hours: Temp:  [97.5 F (36.4 C)-98.6 F (37 C)] 98.4 F (36.9 C) (12/30 0400) Pulse Rate:  [92-168] 107 (12/30 0400) Resp:  [17-30] 20 (12/30 0400) BP: (90-125)/(41-68) 105/44 mmHg (12/30 0400) SpO2:  [94 %-100 %] 100 % (12/30 0400) Weight:  [65.227 kg (143 lb 12.8 oz)] 65.227 kg (143 lb 12.8 oz) (12/30 0400)    Intake/Output from previous day: 12/29 0701 - 12/30 0700 In: 450 [I.V.:450] Out: 825 [Urine:825] Intake/Output this shift: Total I/O In: 450 [I.V.:450] Out: 225 [Urine:225]    Lab Results:  Recent Labs  06/28/14 1115 06/29/14 0503 06/30/14 0545 07/01/14 0156  WBC 8.3 6.6 7.1 7.2  HGB 8.2*  8.1* 8.3* 8.9* 8.3*  HCT 25.5*  25.9* 26.5* 28.0* 26.6*  PLT 210 222 235 219   BMET  Recent Labs  06/28/14 1905 06/29/14 0503 06/30/14 0545 07/01/14 0156  NA 132* 136 136 138  K 4.9 4.4 4.6 4.5  CL 98 102 102 103  CO2 28 26 26 28   CREATININE 1.13 0.89 0.97 0.88   LFT No results for input(s): PROT, AST, ALT, ALKPHOS, BILITOT, BILIDIR, IBILI in the last 72 hours. PT/INR No results for input(s): LABPROT, INR in the last 72 hours. PANCREAS No results for input(s): LIPASE in the last 72 hours.       Studies/Results: No results found.  Medications: I have reviewed the patient's current medications.  Assessment/Plan: 1. Anemia/Heme pos Stools. Neg EGD colon cancelled due to cardiac issues. Will resume heart diet. 2. A Fib with RFR  If pt improves and colonoscopy needed please let us know   Emir Nack JR,Lonzy Mato L 07/01/2014, 6:48 AM

## 2014-07-01 NOTE — Progress Notes (Signed)
Pt transferred to [facility] per MD order. Pt alert and oriented at time of transfer, c/o of chest pain. Pt also states he feels short of breath, cool and clammy. Pt HR sustaining 150s-170s at time of transfer, sats 100% on 3L nasal cannula, BP 99/80. Pt given 40 mg IV lasix and 0.125mg  of IV digoxin per MD order prior to transfer. Pt wife and daughter notified of transfer. All belongings sent with pt. Report given to receiving RN. Lenna Sciara

## 2014-07-01 NOTE — Consult Note (Addendum)
ELECTROPHYSIOLOGY CONSULT NOTE    Patient ID: [name] MRN: [id number], DOB/AGE: 02-07-1936 78 y.o.  Admit date: 06/27/2014 Date of Consult: 07/01/2014  Primary Physician: Dorian Heckle, MD Primary Cardiologist: Pernell Dupre, MD Electrophysiologist: Lovena Le  Reason for Consultation: AF with RVR  HPI:  [name] is a 78 y.o. male chronically ill patient of Drs Tamala Julian and Lovena Le who was admitted on 06-27-14 with shortness of breath.  His past medical history is significant for CAD (s/p CABG), ischemic cardiomypothy (s/p CRTD), permanent atrial fibrillation (not on anticoagulation due to prior subdural hematoma- notes from 2013), congestive heart failure and ventricular tachycardia. He also has chronic anemia that has been followed by Dr Benay Spice.  On admission, his Hgb was noted to be 7 which is lower than his baseline.  FOBT positive, endoscopy negative, unable to do colonoscopy secondary to tachycardia.   CRTD was interrogated this morning and demonstrated normal device function.  Overall reasonable heart rate control.  Optivol is significantly elevated.  He had an ICD shock on 06-11-14 for what appears to be AF with RVR (morphology template matches underlying R waves when patient in AF).  He has also had episodes of NSVT.  CRT pacing 62.9% of the time with VSR programmed on.  Morphology template was updated.    He has had significant shortness of breath over recent weeks with generalized weakness.   He has developed a sacral decubitus ulcer due to immobility.  He denies chest pain or shortness of breath today.  He was previously on amiodarone but this was discontinued secondary to concerns about lung toxicity. It is not clear if amio lung toxicity was actually present, but the patient improved after discontinuing medication and a round of antibiotics and prednisone (notes from 2010).    Last echo 08/2013 demonstrated EF 20-25% with diffuse hypokinesis, moderate to severe MR,  biatrial enlargement, PA pressure 45.   EP has been asked to evaluate for treatment options.   Past Medical History  Diagnosis Date  . PVD (peripheral vascular disease)   . HTN (hypertension)   . CHF (congestive heart failure)   . Ventricular tachycardia   . Ischemic heart disease, chronic   . Dyslipidemia   . CAD (coronary artery disease)     a. s/p CABG, last cath 2012 -severe ischemic cardiomyopathy with EF< 15%, patent vein grafts  . Self-catheterizes urinary bladder     since back surgery.  6- 7 times a day. (05/27/2014)  . Subdural hematoma 2013    a. anticoagulation discontinued at that time  . Neurogenic bladder     since back surgery - self caths  . Neurogenic bowel     digitally stimulates daily.  Marland Kitchen COPD (chronic obstructive pulmonary disease)     a. on home O2  . Emphysema   . Anemia     SERUM MONOCLONAL IgM KAPPA PROTEIN - NO CLINICAL EVIDENCE FOR PROGRESSION TO MULTIPLE MYELOMA OR OTHER PROLIFERATIVE DISORDER - PER OFFICE NOTE DR. SHERRILL DATED 12/30/13  . Pneumonia X 2  . Borderline type 2 diabetes mellitus   . TIA (transient ischemic attack) 1980's  . Colon cancer   . MRSA infection   . Permanent atrial fibrillation      Surgical History:  Past Surgical History  Procedure Laterality Date  . Lumbar disc surgery  X 2  . Iliac artery stent Left   . Bi-ventricular implantable cardioverter defibrillator  (crt-d)  04-10-08    implantation of Medtronic Concerto Bi-V ICD [;  Cristopher Peru MD  . Colonoscopy    . Polypectomy    . Insertion of iliac stent Right 06/04/2012    external   . Cardiac catheterization  2012    patent vein grafts, EF <15%  . Bi-ventricular implantable cardioverter defibrillator  (crt-d)  2009; 2014    MDT CRTD upgraded from CRTP by Dr Mitzi Davenport; gen change 2014 by Dr Lovena Le  . Endarterectomy femoral  06/28/2012    Procedure: ENDARTERECTOMY FEMORAL;  Surgeon: Serafina Mitchell, MD;  Location: [facility];  Service: Vascular;  Laterality: Left;  .  Patch angioplasty  06/28/2012    Procedure: PATCH ANGIOPLASTY;  Surgeon: Serafina Mitchell, MD;  Location: [facility] OR;  Service: Vascular;  Laterality: Left;  Using 0.8 cm x 15.2 cm Hemashield Patch.  . Colon surgery  03/2004  . Cataract extraction w/ intraocular lens  implant, bilateral Bilateral   . Cholecystectomy    . Coronary artery bypass graft  1998    x3 vessels  . Femoral-popliteal bypass graft Right 03/28/2013    Procedure: BYPASS GRAFT FEMORAL-BELOW KNEE POPLITEAL ARTERY Using 98mm x 80 cm Propaten Graft ,With Right Common Femoral Artery Endarterectomy.;  Surgeon: Serafina Mitchell, MD;  Location: [facility] OR;  Service: Vascular;  Laterality: Right;  . Debridment of decubitus ulcer N/A 05/18/2014    Procedure: EXCISION AND CLOSURE OF DECUBITUS ULCER 5CM;  Surgeon: Pedro Earls, MD;  Location: [facility] ORS;  Service: General;  Laterality: N/A;  . Posterior lumbar fusion  X 1  . Abdominal aortagram N/A 02/21/2012    Procedure: ABDOMINAL Maxcine Ham;  Surgeon: Serafina Mitchell, MD;  Location: [facility] C[facility] LAB;  Service: Cardiovascular;  Laterality: N/A;  . Abdominal aortagram N/A 06/04/2012    Procedure: ABDOMINAL AORTAGRAM;  Surgeon: Serafina Mitchell, MD;  Location: [facility] CATH LAB;  Service: Cardiovascular;  Laterality: N/A;  . Biv icd genertaor change out N/A 10/22/2012    MDT CRTD generator change by Dr Lovena Le  . Abdominal aortagram N/A 03/18/2013    Procedure: ABDOMINAL Maxcine Ham;  Surgeon: Serafina Mitchell, MD;  Location: [facility] CATH LAB;  Service: Cardiovascular;  Laterality: N/A;  . Abdominal aortagram N/A 07/02/2013    Procedure: ABDOMINAL AORTAGRAM;  Surgeon: Serafina Mitchell, MD;  Location: [facility] CATH LAB;  Service: Cardiovascular;  Laterality: N/A;  . Lower extremity angiogram Bilateral 07/02/2013    Procedure: LOWER EXTREMITY ANGIOGRAM;  Surgeon: Serafina Mitchell, MD;  Location: [facility] CATH LAB;  Service: Cardiovascular;  Laterality: Bilateral;  . Percutaneous stent intervention Right 07/02/2013    Procedure:  PERCUTANEOUS STENT INTERVENTION;  Surgeon: Serafina Mitchell, MD;  Location: [facility] CATH LAB;  Service: Cardiovascular;  Laterality: Right;  rt ext iliac stent  . Esophagogastroduodenoscopy (egd) with propofol N/A 06/29/2014    Procedure: ESOPHAGOGASTRODUODENOSCOPY (EGD) WITH PROPOFOL;  Surgeon: Winfield Cunas., MD;  Location: [facility] ENDOSCOPY;  Service: Endoscopy;  Laterality: N/A;     Prescriptions prior to admission  Medication Sig Dispense Refill Last Dose  . acetaminophen (TYLENOL) 325 MG tablet Take 2 tablets (650 mg total) by mouth every 4 (four) hours as needed for headache or mild pain.   unknown  . albuterol (PROVENTIL HFA;VENTOLIN HFA) 108 (90 BASE) MCG/ACT inhaler Inhale 2 puffs into the lungs every 6 (six) hours as needed for wheezing or shortness of breath.    06/27/2014 at Unknown time  . albuterol (PROVENTIL) (2.5 MG/3ML) 0.083% nebulizer solution Take 2.5 mg by nebulization 4 (four) times daily.    06/27/2014 at Unknown time  .  ALPRAZolam (XANAX) 0.25 MG tablet Take 1 tablet (0.25 mg total) by mouth 2 (two) times daily as needed for anxiety (sob). 30 tablet 0 06/27/2014 at Unknown time  . aspirin EC 325 MG tablet Take 325 mg by mouth daily.   06/27/2014 at Unknown time  . budesonide (PULMICORT) 0.5 MG/2ML nebulizer solution Take 0.5 mg by nebulization 2 (two) times daily.    06/27/2014 at Unknown time  . candesartan (ATACAND) 32 MG tablet Take 32 mg by mouth daily.   06/27/2014 at Unknown time  . digoxin (LANOXIN) 0.125 MG tablet Take 0.125 mg by mouth every morning.   06/27/2014 at Unknown time  . diltiazem (CARDIZEM CD) 180 MG 24 hr capsule Take 1 capsule (180 mg total) by mouth daily. 30 capsule 1 06/27/2014 at Unknown time  . ferrous sulfate 325 (65 FE) MG tablet Take 325 mg by mouth at bedtime.    06/27/2014 at Unknown time  . furosemide (LASIX) 40 MG tablet TAKE 2 TABLETS IN THE MORNING AND 1 TABLET IN THE EVENING 270 tablet 1 06/27/2014 at Unknown time  . GLUCERNA (GLUCERNA) LIQD  Take 237 mLs by mouth 3 (three) times daily between meals.   06/27/2014 at Unknown time  . guaiFENesin (MUCINEX) 600 MG 12 hr tablet Take 600 mg by mouth 2 (two) times daily. congestion   06/27/2014 at Unknown time  . Hydrocodone-Acetaminophen 5-300 MG TABS Take 1 tablet by mouth every 6 (six) hours as needed (pain).   06/27/2014 at Unknown time  . irbesartan (AVAPRO) 150 MG tablet Take 1 tablet (150 mg total) by mouth daily. 30 tablet 1 06/27/2014 at Unknown time  . KLOR-CON M20 20 MEQ tablet TAKE 1 TABLET DAILY 90 tablet 1 06/27/2014 at Unknown time  . megestrol (MEGACE) 20 MG tablet Take 1 tablet (20 mg total) by mouth 2 (two) times daily. 60 tablet 1 06/27/2014 at Unknown time  . mupirocin ointment (BACTROBAN) 2 % Apply 1 application topically 2 (two) times daily as needed (Cuts and bruises).   Past Month at Unknown time  . nitroGLYCERIN (NITRODUR - DOSED IN MG/24 HR) 0.2 mg/hr patch Place 1 patch onto the skin daily.   06/27/2014 at Unknown time  . nitroGLYCERIN (NITROSTAT) 0.4 MG SL tablet Place 0.4 mg under the tongue every 5 (five) minutes as needed for chest pain.   Past Month at Unknown time  . predniSONE (DELTASONE) 10 MG tablet Take 1 tablet (10 mg total) by mouth daily. Take 20 mg (2 tablets) for 4 days then return to 10 mg daily (Patient taking differently: Take 10 mg by mouth daily. ) 30 tablet 0 06/27/2014 at Unknown time  . PRESCRIPTION MEDICATION Uses 1L-2L of home Oxygen   06/28/2014 at Unknown time  . simvastatin (ZOCOR) 20 MG tablet Take 20 mg by mouth at bedtime.    06/27/2014 at Unknown time  . tiotropium (SPIRIVA) 18 MCG inhalation capsule Place 18 mcg into inhaler and inhale daily.    06/27/2014 at Unknown time  . aspirin EC 325 MG tablet Take 325 mg by mouth at bedtime.   06/02/2014 at Unknown time  . collagenase (SANTYL) ointment Apply topically 2 (two) times daily. (Patient not taking: Reported on 06/28/2014) 15 g 0 unknown  . feeding supplement, ENSURE COMPLETE, (ENSURE  COMPLETE) LIQD Take 237 mLs by mouth 2 (two) times daily between meals. (Patient not taking: Reported on 06/28/2014) 60 Bottle 0 unknown  . HYDROcodone-acetaminophen (VICODIN) 2.5-500 MG per tablet Take 1 tablet by mouth every 6 (  six) hours as needed for pain.   unknown  . levofloxacin (LEVAQUIN) 500 MG tablet Take 1 tablet (500 mg total) by mouth daily. (Patient not taking: Reported on 06/28/2014) 3 tablet 0   . PROVENTIL HFA 108 (90 BASE) MCG/ACT inhaler USE 1 TO 2 INHALATIONS INTO THE LUNGS EVERY 6 HOURS AS NEEDED FOR SHORTNESS OF BREATH (Patient not taking: Reported on 06/28/2014) 3 each 0     Inpatient Medications:  . budesonide  0.5 mg Nebulization BID  . Chlorhexidine Gluconate Cloth  6 each Topical Q0600  . guaiFENesin  600 mg Oral BID  . insulin aspart  0-9 Units Subcutaneous 6 times per day  . levalbuterol  0.63 mg Nebulization Q6H  . metoprolol  2.5 mg Intravenous 4 times per day  . mupirocin ointment  1 application Nasal BID  . pantoprazole  40 mg Oral BID  . predniSONE  10 mg Oral Q breakfast  . tiotropium  18 mcg Inhalation Daily    Allergies: No Known Allergies  History   Social History  . Marital Status: Married    Spouse Name: N/A    Number of Children: N/A  . Years of Education: N/A   Occupational History  .      Retired Social research officer, government   Social History Main Topics  . Smoking status: Former Smoker -- 3.00 packs/day for 48 years    Types: Cigarettes    Quit date: 07/03/2002  . Smokeless tobacco: Never Used  . Alcohol Use: Yes     Comment: 05/27/2014 "a beer q 6 months or so"  . Drug Use: No  . Sexual Activity: No   Other Topics Concern  . Not on file   Social History Narrative   Married, wife [name]   Oxygen-continuous   I/O self cath since back surgery     Family History  Problem Relation Age of Onset  . Heart failure Father   . Diabetes Father   . Heart disease Father   . Hyperlipidemia Father   . Hypertension Father   .  Heart disease Mother     before age 32  . Hyperlipidemia Mother   . Hypertension Mother     BP 122/56 mmHg  Pulse 89  Temp(Src) 98.6 F (37 C) (Oral)  Resp 19  Ht $R'5\' 7"'MO$  (1.702 m)  Wt 143 lb 12.8 oz (65.227 kg)  BMI 22.52 kg/m2  SpO2 99%  Physical Exam: Acute on chronically ill appearing NAD HEENT: Unremarkable,[place], AT Neck:  6 JVD, no thyromegally Back:  No CVA tenderness Lungs:  Clear with no wheezes, rales, or rhonchi HEART:  Irregular rate rhythm, no murmurs, no rubs, no clicks Abd:  soft, positive bowel sounds, no organomegally, no rebound, no guarding Ext:  2 plus pulses, no edema, no cyanosis, no clubbing Skin:  No rashes no nodules Neuro:  CN II through XII intact, motor grossly intact   Labs:   Lab Results  Component Value Date   WBC 7.2 07/01/2014   HGB 8.3* 07/01/2014   HCT 26.6* 07/01/2014   MCV 93.3 07/01/2014   PLT 219 07/01/2014    Recent Labs Lab 06/27/14 2314  07/01/14 0156  NA 137  < > 138  K 6.2*  < > 4.5  CL 98  < > 103  CO2 24  < > 28  BUN 39*  < > 18  CREATININE 1.64*  < > 0.88  CALCIUM 8.8  < > 8.3*  PROT 6.2  --   --  BILITOT 0.4  --   --   ALKPHOS 79  --   --   ALT 34  --   --   AST 52*  --   --   GLUCOSE 304*  < > 141*  < > = values in this interval not displayed.   Radiology/Studies: Dg Chest Port 1 View 06/28/2014   CLINICAL DATA:  Constant short of breath. Chest pain. Leg swelling.  EXAM: PORTABLE CHEST - 1 VIEW  COMPARISON:  Radiograph 06/03/2014  FINDINGS: Left-sided pacemaker overlies stable enlarged cardiac silhouette. No effusion, infiltrate, pneumothorax.  IMPRESSION: Cardiomegaly without acute findings .   Electronically Signed   By: Suzy Bouchard M.D.   On: 06/28/2014 00:00   EKG:AF with RVR, LBBB  TELEMETRY: AF with RVR, ventricular rates 120-140's mostly, intermittent VSR pacing  DEVICE HISTORY:   - see above. In atrial fib over a year.  A/P 1. Atrial fib with an RVR 2. Advanced,near endstage systolic heart  failure 3. Ischemic cardiomyopathy with chest pressure during rapid atrial fib 4. H/o subdural hematoma 5.  H/o amio lung toxicity  The patient has a very difficult situation and overall his prognosis is guarded. His HR is poorly controlled which I think is mostly related to his decompensated heart failure in conjunction with underlying copd and possible ischemia. For now I'd suggest additional IV lasix and judicious use of IV metoprolol as his blood pressure allows. Will consider AV node ablation although my experience has been that patient's with atrial fib with an RVR and decompensated heart failure have limited benefit with this procedure. I have discussed this with the patient. For now, will attempt to control his AV conduction with IV digoxin. Plan to give a total of 0.75 mg over the course of the day.  Mikle Bosworth.D.

## 2014-07-01 NOTE — Progress Notes (Signed)
PT Cancellation Note  Patient Details Name: [name] MRN: [id number] DOB: 19-Mar-1936   Cancelled Treatment:    Reason Eval/Treat Not Completed: Medical issues which prohibited therapy c/o of chest pain, short of breath, Pt HR sustaining 150s-170s and elevated troponin; therapy will hold at this time and await clearance and down-trending troponin.    Duncan Dull 07/01/2014, 3:40 PM Alben Deeds, [place] DPT  [phone]

## 2014-07-01 NOTE — Consult Note (Signed)
WOC consulted completed for sacral pressure ulcer 06/28/14, please see note.  Orders in the patient's chart for wound care.  No new consult needed at this time.   Thanks  Renuka Farfan Kellogg, [place] [phone])

## 2014-07-02 ENCOUNTER — Encounter (HOSPITAL_COMMUNITY)
Admission: EM | Disposition: A | Discharge status: Home Health | Payer: Self-pay | Source: Home / Self Care | Attending: Internal Medicine

## 2014-07-02 DIAGNOSIS — I5023 Acute on chronic systolic (congestive) heart failure: Secondary | ICD-10-CM

## 2014-07-02 DIAGNOSIS — I4891 Unspecified atrial fibrillation: Secondary | ICD-10-CM

## 2014-07-02 LAB — GLUCOSE, CAPILLARY
Glucose-Capillary: 117 mg/dL — ABNORMAL HIGH (ref 70–99)
Glucose-Capillary: 131 mg/dL — ABNORMAL HIGH (ref 70–99)
Glucose-Capillary: 145 mg/dL — ABNORMAL HIGH (ref 70–99)
Glucose-Capillary: 186 mg/dL — ABNORMAL HIGH (ref 70–99)
Glucose-Capillary: 187 mg/dL — ABNORMAL HIGH (ref 70–99)
Glucose-Capillary: 97 mg/dL (ref 70–99)

## 2014-07-02 LAB — BASIC METABOLIC PANEL
Anion gap: 7 (ref 5–15)
BUN: 22 mg/dL (ref 6–23)
CHLORIDE: 104 meq/L (ref 96–112)
CO2: 26 mmol/L (ref 19–32)
CREATININE: 1.02 mg/dL (ref 0.50–1.35)
Calcium: 8.3 mg/dL — ABNORMAL LOW (ref 8.4–10.5)
GFR calc non Af Amer: 69 mL/min — ABNORMAL LOW (ref >90)
GFR, EST AFRICAN AMERICAN: 80 mL/min — AB (ref >90)
GLUCOSE: 127 mg/dL — AB (ref 70–99)
Potassium: 4.5 mmol/L (ref 3.5–5.1)
Sodium: 137 mmol/L (ref 135–145)

## 2014-07-02 LAB — CBC
HEMATOCRIT: 27.9 % — AB (ref 39.0–52.0)
HEMOGLOBIN: 8.6 g/dL — AB (ref 13.0–17.0)
MCH: 29.1 pg (ref 26.0–34.0)
MCHC: 30.8 g/dL (ref 30.0–36.0)
MCV: 94.3 fL (ref 78.0–100.0)
Platelets: 218 10*3/uL (ref 150–400)
RBC: 2.96 MIL/uL — ABNORMAL LOW (ref 4.22–5.81)
RDW: 15.8 % — ABNORMAL HIGH (ref 11.5–15.5)
WBC: 7.5 10*3/uL (ref 4.0–10.5)

## 2014-07-02 SURGERY — AV NODE ABLATION
Anesthesia: LOCAL

## 2014-07-02 MED ORDER — DIGOXIN 0.25 MG/ML IJ SOLN
0.2500 mg | Freq: Every day | INTRAMUSCULAR | Status: DC
  Administered 2014-07-02: 0.25 mg via INTRAVENOUS
  Filled 2014-07-02 (×2): qty 1

## 2014-07-02 NOTE — Evaluation (Signed)
Physical Therapy Evaluation Patient Details Name: [name] MRN: [id number] DOB: 01/02/1936 Today's Date: 07/02/2014   History of Present Illness  [name] is a 78 y.o. male with a history of CAD, Ischemic Cardiomyopathy, Systolic CHF, COPD, Chronic Respiratory Failure , HTN , Hyperlipidemia, DM2, Remote history of Colon Cancer who presents to the ED with complaints of SOB and Chest pain . Shortness of breath was transient and improved after being placed on BiPAP in the ER. BP was 741 systolic HR 287-867. He was found to be anemic. On 12/30 patient moved to ICU secondary to Medical issues including c/o of chest pain, short of breath, Pt HR sustaining 150s-170s and elevated troponin.   Clinical Impression  Patient demonstrates deficits in functional mobility as indicated below. Will need continued skilled PT to address deficits and maximize function. Will see as indicated and progress as tolerated. Spoke with patient and spouse at length regarding plan for home, as well as use of equipment at home. Educated patient on appropriate use of lift chair and other assistive equipment. OF NOTE: during session HR elevated to 140s SpO2 stable on supplemental O2.    Follow Up Recommendations Home health PT;Other (comment) (pt states he has all the help he needs, declines rehab)    Equipment Recommendations  None recommended by PT    Recommendations for Other Services       Precautions / Restrictions Precautions Precautions: Fall Restrictions Weight Bearing Restrictions: No      Mobility  Bed Mobility Overal bed mobility: Needs Assistance Bed Mobility: Rolling;Sidelying to Sit Rolling: Min assist Sidelying to sit: Mod assist       General bed mobility comments: Moderate assist with VCs for sequencing and positioning, assist to elevate to upright  Transfers Overall transfer level: Needs assistance Equipment used: Rolling walker (2 wheeled) Transfers: Sit to/from [employer] Sit to Stand: Min assist Stand pivot transfers: Supervision;Min assist       General transfer comment: Assist for stability, VCs for hand placement  Ambulation/Gait                Stairs            Wheelchair Mobility    Modified Rankin (Stroke Patients Only)       Balance   Sitting-balance support: No upper extremity supported Sitting balance-Leahy Scale: Good       Standing balance-Leahy Scale: Fair                               Pertinent Vitals/Pain      Home Living Family/patient expects to be discharged to:: Private residence Living Arrangements: Spouse/significant other Available Help at Discharge: Family;Available 24 hours/day Type of Home: House Home Access: Stairs to enter Entrance Stairs-Rails: Right;Left;Can reach both Entrance Stairs-Number of Steps: 3-5 depending on which door he goes in Home Layout: Two level;Able to live on main level with bedroom/bathroom Home Equipment: Gilford Rile - 2 wheels;Cane - single point;Wheelchair - manual;Shower seat;Bedside commode;Grab bars - tub/shower;Hand held shower head      Prior Function Level of Independence: Independent with assistive device(s)         Comments: Pt states his wife occasionally assists him but he was independent - likes for her to be there just in case when he is showering. States that he tries to exercise regularly with hand weights and walking outside. He says his wife is in great shape--that she is  an avid Production designer, theatre/television/film [employer]   Dominant Hand: Right    Extremity/Trunk Assessment               Lower Extremity Assessment: Generalized weakness      Cervical / Trunk Assessment: Kyphotic  Communication   Communication: No difficulties  Cognition Arousal/Alertness: Awake/alert Behavior During Therapy: WFL for tasks assessed/performed Overall Cognitive Status: Within Functional Limits for tasks assessed                       General Comments General comments (skin integrity, edema, etc.): Patient with large sacral wound, oderous, HR elevated 140s with activity, SpO2 stable >90% on supplemental O2    Exercises        Assessment/Plan    PT Assessment Patient needs continued PT services  PT Diagnosis Difficulty walking;Generalized weakness   PT Problem List Decreased strength;Decreased range of motion;Decreased balance;Decreased activity tolerance;Decreased mobility;Decreased knowledge of use of DME;Decreased safety awareness;Decreased knowledge of precautions;Cardiopulmonary status limiting activity  PT Treatment Interventions DME instruction;Gait training;Functional mobility training;Stair training;Therapeutic activities;Therapeutic exercise;Neuromuscular re-education;Patient/family education   PT Goals (Current goals can be found in the Care Plan section) Acute Rehab PT Goals Patient Stated Goal: home sooner than later PT Goal Formulation: With patient/family Time For Goal Achievement: 06/19/14 Potential to Achieve Goals: Good    Frequency Min 3X/week   Barriers to discharge        Co-evaluation               End of Session Equipment Utilized During Treatment: Oxygen Activity Tolerance: Patient limited by fatigue;Patient tolerated treatment well Patient left: in chair;with call bell/phone within reach;with family/visitor present Nurse Communication: Mobility status         Time: 0950-1016 PT Time Calculation (min) (ACUTE ONLY): 26 min   Charges:   PT Evaluation $Initial PT Evaluation Tier I: 1 Procedure PT Treatments $Therapeutic Activity: 8-22 mins $Self Care/Home Management: 8-22   PT G CodesDuncan Dull 21-Jul-2014, 1:40 PM Alben Deeds, [place] DPT  [phone]

## 2014-07-02 NOTE — Progress Notes (Signed)
SUBJECTIVE: The patient is improved today.  Ventricular rates improved with Digoxin.  Shortness of breath also improved. He remains critically ill. Nurse notes large sacral decubitus.  CURRENT MEDICATIONS: . budesonide  0.5 mg Nebulization BID  . Chlorhexidine Gluconate Cloth  6 each Topical Q0600  . digoxin  0.25 mg Intravenous Daily  . guaiFENesin  600 mg Oral BID  . insulin aspart  0-9 Units Subcutaneous 6 times per day  . levalbuterol  0.63 mg Nebulization Q6H  . metoprolol  2.5 mg Intravenous 4 times per day  . mupirocin ointment  1 application Nasal BID  . pantoprazole  40 mg Oral BID  . predniSONE  10 mg Oral Q breakfast  . tiotropium  18 mcg Inhalation Daily      OBJECTIVE: Physical Exam: Filed Vitals:   07/02/14 0447 07/02/14 0500 07/02/14 0600 07/02/14 0739  BP:  122/51 120/60   Pulse:      Temp:      TempSrc:      Resp:  15 14   Height:      Weight: 156 lb 12 oz (71.1 kg)     SpO2:  94% 99% 99%    Intake/Output Summary (Last 24 hours) at 07/02/14 0857 Last data filed at 07/02/14 0500  Gross per 24 hour  Intake     10 ml  Output    275 ml  Net   -265 ml    Telemetry reveals atrial fibrillation, ventricular rates 70-90's with V pacing  Physical Exam:  Chronically ill appearing 78 yo man, very ill appearing HEENT: Unremarkable,[place], AT Neck:  9 cm JVD, no thyromegally Back:  No CVA tenderness Lungs:  Clear with no wheezes, rales, or rhonchi HEART:  IRegular rate rhythm, no murmurs, no rubs, no clicks Abd:  soft, positive bowel sounds, no organomegally, no rebound, no guarding Ext:  2 plus pulses, no edema, no cyanosis, no clubbing Skin:  No rashes no nodules Neuro:  CN II through XII intact, motor grossly intact   LABS: Basic Metabolic Panel:  Recent Labs  07/01/14 0156 07/02/14 0242  NA 138 137  K 4.5 4.5  CL 103 104  CO2 28 26  GLUCOSE 141* 127*  BUN 18 22  CREATININE 0.88 1.02  CALCIUM 8.3* 8.3*   CBC:  Recent Labs   07/01/14 0156 07/02/14 0242  WBC 7.2 7.5  HGB 8.3* 8.6*  HCT 26.6* 27.9*  MCV 93.3 94.3  PLT 219 218   Cardiac Enzymes:  Recent Labs  06/30/14 1448 06/30/14 1946 07/01/14 0151  TROPONINI 0.53* 0.82* 0.87*   Anemia Panel:  Recent Labs  07/01/14 0156  VITAMINB12 747  FOLATE >20.0  FERRITIN 972*  TIBC 161*  IRON 24*  RETICCTPCT 3.1    RADIOLOGY: Dg Chest Port 1 View 07/01/2014   CLINICAL DATA:  Congestive heart failure. Chest pain. Atrial fibrillation.  EXAM: PORTABLE CHEST - 1 VIEW  COMPARISON:  06/27/2014  FINDINGS: Sequelae of prior CABG are again identified. ICD remains in place. Cardiac silhouette remains enlarged. There is increased retrocardiac opacity in the left lung base with partial obscuration of the left hemidiaphragm. A small left pleural effusion is questioned. The right lung remains grossly clear. No pneumothorax is identified. Right upper quadrant abdominal surgical clips are noted.  IMPRESSION: Left basilar atelectasis versus infiltrate. Question small left pleural effusion.   Electronically Signed   By: Logan Bores   On: 07/01/2014 10:49     ASSESSMENT AND PLAN:  1. Atrial  fib with an RVR 2. Acute on chronic systolic heart failure 3. S/p biv ICD 4. Sacral decubitus Rec: We will continue IV digoxin which has improved his ventricular rate down to the 80-110 range from 150-170/min range. Prognosis remains poor. I am concerned about his sacral decubitus and would like some way in whether he needs a wound vac.  Mikle Bosworth.D.

## 2014-07-03 LAB — GLUCOSE, CAPILLARY
GLUCOSE-CAPILLARY: 167 mg/dL — AB (ref 70–99)
Glucose-Capillary: 156 mg/dL — ABNORMAL HIGH (ref 70–99)
Glucose-Capillary: 73 mg/dL (ref 70–99)
Glucose-Capillary: 90 mg/dL (ref 70–99)
Glucose-Capillary: 116 mg/dL — ABNORMAL HIGH (ref 70–99)
Glucose-Capillary: 203 mg/dL — ABNORMAL HIGH (ref 70–99)

## 2014-07-03 MED ORDER — DIGOXIN 125 MCG PO TABS
0.1250 mg | ORAL_TABLET | Freq: Every day | ORAL | Status: DC
  Administered 2014-07-03 – 2014-07-06 (×4): 0.125 mg via ORAL
  Filled 2014-07-03 (×4): qty 1

## 2014-07-03 MED ORDER — METOPROLOL TARTRATE 50 MG PO TABS
50.0000 mg | ORAL_TABLET | Freq: Two times a day (BID) | ORAL | Status: DC
  Administered 2014-07-03 – 2014-07-08 (×12): 50 mg via ORAL
  Filled 2014-07-03 (×12): qty 1

## 2014-07-03 NOTE — Progress Notes (Signed)
Patient ID: [name], male   DOB: 07-Aug-1935, 79 y.o.   MRN: [id number]    SUBJECTIVE: The patient is improved today.  Ventricular rates improved with Digoxin.  Shortness of breath also improved. Also blood pressure is much improved.  large sacral decubitus removed  CURRENT MEDICATIONS: . budesonide  0.5 mg Nebulization BID  . digoxin  0.25 mg Intravenous Daily  . guaiFENesin  600 mg Oral BID  . insulin aspart  0-9 Units Subcutaneous 6 times per day  . levalbuterol  0.63 mg Nebulization Q6H  . metoprolol  2.5 mg Intravenous 4 times per day  . mupirocin ointment  1 application Nasal BID  . pantoprazole  40 mg Oral BID  . predniSONE  10 mg Oral Q breakfast  . tiotropium  18 mcg Inhalation Daily      OBJECTIVE: Physical Exam: Filed Vitals:   07/03/14 0300 07/03/14 0400 07/03/14 0500 07/03/14 0600  BP: 141/63 131/61 126/56 144/45  Pulse:      Temp:  97.9 F (36.6 C)    TempSrc:  Oral    Resp: 18 24 13 17   Height:      Weight:   157 lb 10.1 oz (71.5 kg)   SpO2: 97% 97% 99% 99%    Intake/Output Summary (Last 24 hours) at 07/03/14 0708 Last data filed at 07/03/14 0500  Gross per 24 hour  Intake    610 ml  Output    775 ml  Net   -165 ml    Telemetry reveals atrial fibrillation, ventricular rates 70-110's with V pacing  Physical Exam:  Chronically ill appearing 79 yo man, very ill HEENT: Unremarkable,[place], AT Neck:  8 cm JVD, no thyromegally Back:  No CVA tenderness Lungs:  Clear with no wheezes, rales, or rhonchi HEART:  IRegular rate rhythm, no murmurs, no rubs, no clicks Abd:  soft, positive bowel sounds, no organomegally, no rebound, no guarding Ext:  2 plus pulses, no edema, no cyanosis, no clubbing Skin:  No rashes no nodules Neuro:  CN II through XII intact, motor grossly intact   LABS: Basic Metabolic Panel:  Recent Labs  07/01/14 0156 07/02/14 0242  NA 138 137  K 4.5 4.5  CL 103 104  CO2 28 26  GLUCOSE 141* 127*  BUN 18 22  CREATININE 0.88  1.02  CALCIUM 8.3* 8.3*   CBC:  Recent Labs  07/01/14 0156 07/02/14 0242  WBC 7.2 7.5  HGB 8.3* 8.6*  HCT 26.6* 27.9*  MCV 93.3 94.3  PLT 219 218   Cardiac Enzymes:  Recent Labs  06/30/14 1448 06/30/14 1946 07/01/14 0151  TROPONINI 0.53* 0.82* 0.87*   Anemia Panel:  Recent Labs  07/01/14 0156  VITAMINB12 747  FOLATE >20.0  FERRITIN 972*  TIBC 161*  IRON 24*  RETICCTPCT 3.1    RADIOLOGY: Dg Chest Port 1 View 07/01/2014   CLINICAL DATA:  Congestive heart failure. Chest pain. Atrial fibrillation.  EXAM: PORTABLE CHEST - 1 VIEW  COMPARISON:  06/27/2014  FINDINGS: Sequelae of prior CABG are again identified. ICD remains in place. Cardiac silhouette remains enlarged. There is increased retrocardiac opacity in the left lung base with partial obscuration of the left hemidiaphragm. A small left pleural effusion is questioned. The right lung remains grossly clear. No pneumothorax is identified. Right upper quadrant abdominal surgical clips are noted.  IMPRESSION: Left basilar atelectasis versus infiltrate. Question small left pleural effusion.   Electronically Signed   By: Logan Bores   On: 07/01/2014 10:49  ASSESSMENT AND PLAN:  1. Atrial fib with an RVR 2. Acute on chronic systolic heart failure 3. S/p biv ICD 4. Sacral decubitus Rec: We will continue IV digoxin which has improved his ventricular rate down to the 80-110 range from 150-170/min range. Prognosis remains poor. I am concerned about his sacral decubitus and would like some way in whether he needs a wound vac. As his blood pressure is improved, we will start metoprolol orally.  Mikle Bosworth.D.

## 2014-07-04 DIAGNOSIS — R778 Other specified abnormalities of plasma proteins: Secondary | ICD-10-CM | POA: Diagnosis present

## 2014-07-04 DIAGNOSIS — R7989 Other specified abnormal findings of blood chemistry: Secondary | ICD-10-CM

## 2014-07-04 DIAGNOSIS — Z95810 Presence of automatic (implantable) cardiac defibrillator: Secondary | ICD-10-CM

## 2014-07-04 LAB — GLUCOSE, CAPILLARY
GLUCOSE-CAPILLARY: 198 mg/dL — AB (ref 70–99)
GLUCOSE-CAPILLARY: 157 mg/dL — AB (ref 70–99)
Glucose-Capillary: 142 mg/dL — ABNORMAL HIGH (ref 70–99)
Glucose-Capillary: 182 mg/dL — ABNORMAL HIGH (ref 70–99)
Glucose-Capillary: 229 mg/dL — ABNORMAL HIGH (ref 70–99)
Glucose-Capillary: 280 mg/dL — ABNORMAL HIGH (ref 70–99)

## 2014-07-04 LAB — CBC
HEMATOCRIT: 28.9 % — AB (ref 39.0–52.0)
HEMOGLOBIN: 9 g/dL — AB (ref 13.0–17.0)
MCH: 30 pg (ref 26.0–34.0)
MCHC: 31.1 g/dL (ref 30.0–36.0)
MCV: 96.3 fL (ref 78.0–100.0)
PLATELETS: 221 10*3/uL (ref 150–400)
RBC: 3 MIL/uL — AB (ref 4.22–5.81)
RDW: 16 % — ABNORMAL HIGH (ref 11.5–15.5)
WBC: 9.1 10*3/uL (ref 4.0–10.5)

## 2014-07-04 LAB — BASIC METABOLIC PANEL
ANION GAP: 9 (ref 5–15)
BUN: 17 mg/dL (ref 6–23)
CALCIUM: 8.4 mg/dL (ref 8.4–10.5)
CO2: 25 mmol/L (ref 19–32)
CREATININE: 0.81 mg/dL (ref 0.50–1.35)
Chloride: 104 mEq/L (ref 96–112)
GFR, EST NON AFRICAN AMERICAN: 84 mL/min — AB (ref >90)
GLUCOSE: 196 mg/dL — AB (ref 70–99)
POTASSIUM: 4.3 mmol/L (ref 3.5–5.1)
SODIUM: 138 mmol/L (ref 135–145)

## 2014-07-04 MED ORDER — FUROSEMIDE 10 MG/ML IJ SOLN
40.0000 mg | Freq: Two times a day (BID) | INTRAMUSCULAR | Status: DC
  Administered 2014-07-04 – 2014-07-06 (×5): 40 mg via INTRAVENOUS
  Filled 2014-07-04 (×7): qty 4

## 2014-07-04 MED ORDER — LEVALBUTEROL HCL 0.63 MG/3ML IN NEBU
0.6300 mg | INHALATION_SOLUTION | Freq: Three times a day (TID) | RESPIRATORY_TRACT | Status: DC
  Administered 2014-07-05 – 2014-07-07 (×7): 0.63 mg via RESPIRATORY_TRACT
  Filled 2014-07-04 (×13): qty 3

## 2014-07-04 NOTE — Progress Notes (Signed)
Patient Name: [name]      SUBJECTIVE:   Admitted on 06-27-14 with shortness of breath. His past medical history is significant for CAD (s/p CABG), ischemic cardiomypothy (s/p CRTD), permanent atrial fibrillation (not on anticoagulation due to prior subdural hematoma- notes from 2013), modereate to severe MR congestive heart failure and ventricular tachycardia. Last echo 08/2013 demonstrated EF 20-25% with diffuse hypokinesis, moderate to severe MR, biatrial enlargement, PA pressure 45      He also has chronic anemia that has been followed by Dr Benay Spice. On admission, his Hgb was noted to be 7 which is lower than his baseline. FOBT positive, endoscopy negative, unable to do colonoscopy secondary to tachycardia.   CRTD interrogation>>>demonstrated normal device function.( He had an ICD shock on 06-11-14 for what appears to be AF with RVR (morphology template matches underlying R waves when patient in AF).)   He was previously on amiodarone but this was discontinued secondary to concerns about lung toxicity. It is not clear if amio lung toxicity was actually present, but the patient improved after discontinuing medication and a round of antibiotics and prednisone (notes from 2010).   .   Treated with dig for rate control with some improvement. AV ablation broached but not decided upon. Metoprolol added yesterday  I/O balanced since arrival  Still complaining of shortness of breath Past Medical History  Diagnosis Date  . PVD (peripheral vascular disease)   . HTN (hypertension)   . CHF (congestive heart failure)   . Ventricular tachycardia   . Ischemic heart disease, chronic   . Dyslipidemia   . CAD (coronary artery disease)     a. s/p CABG, last cath 2012 -severe ischemic cardiomyopathy with EF< 15%, patent vein grafts  . Self-catheterizes urinary bladder     since back surgery.  6- 7 times a day. (05/27/2014)  . Subdural hematoma 2013    a. anticoagulation  discontinued at that time  . Neurogenic bladder     since back surgery - self caths  . Neurogenic bowel     digitally stimulates daily.  Marland Kitchen COPD (chronic obstructive pulmonary disease)     a. on home O2  . Emphysema   . Anemia     SERUM MONOCLONAL IgM KAPPA PROTEIN - NO CLINICAL EVIDENCE FOR PROGRESSION TO MULTIPLE MYELOMA OR OTHER PROLIFERATIVE DISORDER - PER OFFICE NOTE DR. SHERRILL DATED 12/30/13  . Pneumonia X 2  . Borderline type 2 diabetes mellitus   . TIA (transient ischemic attack) 1980's  . Colon cancer   . MRSA infection   . Permanent atrial fibrillation     Scheduled Meds:  Scheduled Meds: . budesonide  0.5 mg Nebulization BID  . digoxin  0.125 mg Oral Daily  . guaiFENesin  600 mg Oral BID  . insulin aspart  0-9 Units Subcutaneous 6 times per day  . levalbuterol  0.63 mg Nebulization Q6H  . metoprolol tartrate  50 mg Oral BID  . pantoprazole  40 mg Oral BID  . predniSONE  10 mg Oral Q breakfast  . tiotropium  18 mcg Inhalation Daily   Continuous Infusions:  acetaminophen **OR** acetaminophen, ALPRAZolam, alum & mag hydroxide-simeth, HYDROcodone-acetaminophen, HYDROmorphone (DILAUDID) injection, ondansetron **OR** ondansetron (ZOFRAN) IV, oxyCODONE    PHYSICAL EXAM Filed Vitals:   07/04/14 0400 07/04/14 0500 07/04/14 0600 07/04/14 0801  BP: 106/38  120/59 130/59  Pulse: 82     Temp: 98.5 F (36.9 C)   95.7 F (35.4 C)  TempSrc: Oral   Oral  Resp: _0 Height:      Weight:  151 lb 12.8 oz (68.856 kg)    SpO2: 98%  95% 100%   Well developed and nourished in no acute distress HENT normal Neck supple with JVP->10 Clear  I:E 1:1 Irregularly iregulr with 2/6 systolic murmur  Abd-soft with active BS No Clubbing cyanosis edema Skin-warm and dry A & Oriented  Grossly normal sensory and motor function   TELEMETRY: Reviewed telemetry pt in Afib   Intake/Output Summary (Last 24 hours) at 07/04/14 0803 Last data filed at 07/04/14 0500  Gross per 24  hour  Intake    400 ml  Output    800 ml  Net   -400 ml    LABS: Basic Metabolic Panel:  Recent Labs Lab 06/28/14 0500 06/28/14 1905 06/29/14 0503 06/30/14 0545 07/01/14 0156 07/02/14 0242 07/04/14 0205  NA 134* 132* 136 136 138 137 138  K 5.4* 4.9 4.4 4.6 4.5 4.5 4.3  CL 99 98 102 102 103 104 104  CO2 _1 GLUCOSE 158* 184* 94 108* 141* 127* 196*  BUN 40* 33* 27* _2 CREATININE 1.45* 1.13 0.89 0.97 0.88 1.02 0.81  CALCIUM 8.5 8.1* 8.4 8.5 8.3* 8.3* 8.4   Cardiac Enzymes: No results for input(s): CKTOTAL, CKMB, CKMBINDEX, TROPONINI in the last 72 hours. CBC:  Recent Labs Lab 06/27/14 2314 06/28/14 1115 06/29/14 0503 06/30/14 0545 07/01/14 0156 07/02/14 0242 07/04/14 0205  WBC 14.2* 8.3 6.6 7.1 7.2 7.5 9.1  NEUTROABS 11.0*  --   --   --   --   --   --   HGB 7.0* 8.2*  8.1* 8.3* 8.9* 8.3* 8.6* 9.0*  HCT 23.1* 25.5*  25.9* 26.5* 28.0* 26.6* 27.9* 28.9*  MCV 98.3 94.4 93.0 96.2 93.3 94.3 96.3  PLT 312 210 222 235 219 218 221   PROTIME: No results for input(s): LABPROT, INR in the last 72 hours. Liver Function Tests: No results for input(s): AST, ALT, ALKPHOS, BILITOT, PROT, ALBUMIN in the last 72 hours. No results for input(s): LIPASE, AMYLASE in the last 72 hours. BNP: BNP (last 3 results)  Recent Labs  05/27/14 1430 06/03/14 1328  PROBNP 1578.0* 4268.0*   D-Dimer: No results for input(s): DDIMER in the last 72 hours. Hemoglobin A1C: No results for input(s): HGBA1C in the last 72 hours. Fasting Lipid Panel: No results for input(s): CHOL, HDL, LDLCALC, TRIG, CHOLHDL, LDLDIRECT in the last 72 hours. Thyroid Function Tests: No results for input(s): TSH, T4TOTAL, T3FREE, THYROIDAB in the last 72 hours.  Invalid input(s): FREET3 Anemia Panel: No results for input(s): VITAMINB12, FOLATE, FERRITIN, TIBC, IRON, RETICCTPCT in the last 72 hours.      ASSESSMENT AND PLAN:  Principal Problem:   Symptomatic anemia Active  Problems:   Cardiomyopathy, ischemic- EF 20-25% 2D 08/08/13 Mitral regurg mod -severe   Persistent atrial fibrillation   COPD (chronic obstructive pulmonary disease) with emphysema   Biventricular automatic implantable cardioverter defibrillator in situ   Acute on chronic systolic heart failure, NYHA class 3   Decubitus ulcer of coccygeal region-excision and closure Nov 2015   Elevated troponin  HR control reasonable  Still very short of breath with I/O not appreciably neg and BunCr are falling   I suspect volume is still an issue will also get pulm to see  Today  wioouund care nurse has seen   Signed, Remo Lipps  Caryl Comes MD  07/04/2014

## 2014-07-05 LAB — BASIC METABOLIC PANEL
ANION GAP: 3 — AB (ref 5–15)
BUN: 16 mg/dL (ref 6–23)
CALCIUM: 8.3 mg/dL — AB (ref 8.4–10.5)
CHLORIDE: 101 meq/L (ref 96–112)
CO2: 33 mmol/L — ABNORMAL HIGH (ref 19–32)
CREATININE: 0.79 mg/dL (ref 0.50–1.35)
GFR calc non Af Amer: 85 mL/min — ABNORMAL LOW (ref >90)
GLUCOSE: 94 mg/dL (ref 70–99)
Potassium: 4.3 mmol/L (ref 3.5–5.1)
Sodium: 137 mmol/L (ref 135–145)

## 2014-07-05 LAB — GLUCOSE, CAPILLARY
GLUCOSE-CAPILLARY: 100 mg/dL — AB (ref 70–99)
GLUCOSE-CAPILLARY: 105 mg/dL — AB (ref 70–99)
GLUCOSE-CAPILLARY: 220 mg/dL — AB (ref 70–99)
Glucose-Capillary: 111 mg/dL — ABNORMAL HIGH (ref 70–99)
Glucose-Capillary: 123 mg/dL — ABNORMAL HIGH (ref 70–99)
Glucose-Capillary: 142 mg/dL — ABNORMAL HIGH (ref 70–99)
Glucose-Capillary: 252 mg/dL — ABNORMAL HIGH (ref 70–99)

## 2014-07-05 MED ORDER — FENTANYL CITRATE 0.05 MG/ML IJ SOLN: 25.0000 ug | INTRAMUSCULAR | Status: DC | PRN

## 2014-07-05 MED ORDER — ONDANSETRON HCL 4 MG/2ML IJ SOLN: 4.0000 mg | Freq: Once | INTRAMUSCULAR | Status: DC | PRN

## 2014-07-05 NOTE — Progress Notes (Signed)
Patient Name: [name]      SUBJECTIVE:   Admitted on 06-27-14 with shortness of breath attributed to COPD exacerbation and AFib with rapid rate aggravated by significant anemia   His past medical history is significant for CAD (s/p CABG), ischemic cardiomypothy (s/p CRTD), permanent atrial fibrillation (not on anticoagulation due to prior subdural hematoma- notes from 2013), modereate to severe MR congestive heart failure and ventricular tachycardia. Last echo 08/2013 demonstrated EF 20-25% with diffuse hypokinesis, moderate to severe MR, biatrial enlargement, PA pressure 45      He has chronic anemia that has been followed by Dr Benay Spice. On admission, his Hgb was noted to be 7 which is lower than his baseline. FOBT positive, endoscopy negative, unable to do colonoscopy secondary to tachycardia. Transfused 2 units  CRTD interrogation>>>demonstrated normal device function.( He had an ICD shock on 06-11-14 for what appears to be AF with RVR (morphology template matches underlying R waves when patient in AF).)  He was previously on amiodarone but this was discontinued secondary to concerns about lung toxicity. It is not clear if amio lung toxicity was actually present, but the patient improved after discontinuing medication and a round of antibiotics and prednisone (notes from 2010).   Treated with dig for rate control with some improvement. AV ablation broached but not decided upon. Metoprolol added 1/1 I/O had been balanced  Diuretics made more aggressive 1.2  Much improved this am     Past Medical History  Diagnosis Date  . PVD (peripheral vascular disease)   . HTN (hypertension)   . CHF (congestive heart failure)   . Ventricular tachycardia   . Ischemic heart disease, chronic   . Dyslipidemia   . CAD (coronary artery disease)     a. s/p CABG, last cath 2012 -severe ischemic cardiomyopathy with EF< 15%, patent vein grafts  . Self-catheterizes urinary bladder      since back surgery.  6- 7 times a day. (05/27/2014)  . Subdural hematoma 2013    a. anticoagulation discontinued at that time  . Neurogenic bladder     since back surgery - self caths  . Neurogenic bowel     digitally stimulates daily.  Marland Kitchen COPD (chronic obstructive pulmonary disease)     a. on home O2  . Emphysema   . Anemia     SERUM MONOCLONAL IgM KAPPA PROTEIN - NO CLINICAL EVIDENCE FOR PROGRESSION TO MULTIPLE MYELOMA OR OTHER PROLIFERATIVE DISORDER - PER OFFICE NOTE DR. SHERRILL DATED 12/30/13  . Pneumonia X 2  . Borderline type 2 diabetes mellitus   . TIA (transient ischemic attack) 1980's  . Colon cancer   . MRSA infection   . Permanent atrial fibrillation     Scheduled Meds:  Scheduled Meds: . budesonide  0.5 mg Nebulization BID  . digoxin  0.125 mg Oral Daily  . furosemide  40 mg Intravenous BID  . guaiFENesin  600 mg Oral BID  . insulin aspart  0-9 Units Subcutaneous 6 times per day  . levalbuterol  0.63 mg Nebulization TID  . metoprolol tartrate  50 mg Oral BID  . pantoprazole  40 mg Oral BID  . predniSONE  10 mg Oral Q breakfast  . tiotropium  18 mcg Inhalation Daily   Continuous Infusions:  acetaminophen **OR** acetaminophen, ALPRAZolam, alum & mag hydroxide-simeth, HYDROcodone-acetaminophen, HYDROmorphone (DILAUDID) injection, ondansetron **OR** ondansetron (ZOFRAN) IV, oxyCODONE    PHYSICAL EXAM Filed Vitals:   07/05/14 0400 07/05/14 0436 07/05/14 0600  07/05/14 0733  BP: 113/59  133/83   Pulse:      Temp: 97.6 F (36.4 C)     TempSrc: Oral     Resp: 16  18   Height:      Weight:  148 lb (67.132 kg)    SpO2: 100%  100% 100%   Well developed and nourished in no acute distress HENT normal Neck supple with JVP-<10 Clear decreased l base Irregularly iregulr with 2/6 systolic murmur  Abd-soft with active BS No Clubbing cyanosis edema Skin-warm and dry A & Oriented  Grossly normal sensory and motor function   TELEMETRY: Reviewed telemetry pt in  Afib  HR 80-110   Intake/Output Summary (Last 24 hours) at 07/05/14 0745 Last data filed at 07/05/14 0400  Gross per 24 hour  Intake    700 ml  Output   2325 ml  Net  -1625 ml    LABS: Basic Metabolic Panel:  Recent Labs Lab 06/28/14 1905 06/29/14 0503 06/30/14 0545 07/01/14 0156 07/02/14 0242 07/04/14 0205 07/05/14 0225  NA 132* 136 136 138 137 138 137  K 4.9 4.4 4.6 4.5 4.5 4.3 4.3  CL 98 102 102 103 104 104 101  CO2 $Re'28 26 26 28 26 25 'YlC$ 33*  GLUCOSE 184* 94 108* 141* 127* 196* 94  BUN 33* 27* $Remov'19 18 22 17 16  'EBmNGi$ CREATININE 1.13 0.89 0.97 0.88 1.02 0.81 0.79  CALCIUM 8.1* 8.4 8.5 8.3* 8.3* 8.4 8.3*   Cardiac Enzymes: No results for input(s): CKTOTAL, CKMB, CKMBINDEX, TROPONINI in the last 72 hours. CBC:  Recent Labs Lab 06/28/14 1115 06/29/14 0503 06/30/14 0545 07/01/14 0156 07/02/14 0242 07/04/14 0205  WBC 8.3 6.6 7.1 7.2 7.5 9.1  HGB 8.2*  8.1* 8.3* 8.9* 8.3* 8.6* 9.0*  HCT 25.5*  25.9* 26.5* 28.0* 26.6* 27.9* 28.9*  MCV 94.4 93.0 96.2 93.3 94.3 96.3  PLT 210 222 235 219 218 221   PROTIME: No results for input(s): LABPROT, INR in the last 72 hours. Liver Function Tests: No results for input(s): AST, ALT, ALKPHOS, BILITOT, PROT, ALBUMIN in the last 72 hours. No results for input(s): LIPASE, AMYLASE in the last 72 hours. BNP: BNP (last 3 results)  Recent Labs  05/27/14 1430 06/03/14 1328  PROBNP 1578.0* 4268.0*   D-Dimer: No results for input(s): DDIMER in the last 72 hours. Hemoglobin A1C: No results for input(s): HGBA1C in the last 72 hours. Fasting Lipid Panel: No results for input(s): CHOL, HDL, LDLCALC, TRIG, CHOLHDL, LDLDIRECT in the last 72 hours. Thyroid Function Tests: No results for input(s): TSH, T4TOTAL, T3FREE, THYROIDAB in the last 72 hours.  Invalid input(s): FREET3 Anemia Panel: No results for input(s): VITAMINB12, FOLATE, FERRITIN, TIBC, IRON, RETICCTPCT in the last 72 hours.      ASSESSMENT AND PLAN:  Principal  Problem:   Symptomatic anemia Active Problems:   Cardiomyopathy, ischemic- EF 20-25% 2D 08/08/13 Mitral regurg mod -severe   Persistent atrial fibrillation   COPD (chronic obstructive pulmonary disease) with emphysema   Biventricular automatic implantable cardioverter defibrillator in situ   Acute on chronic systolic heart failure, NYHA class 3   Decubitus ulcer of coccygeal region-excision and closure Nov 2015   Elevated troponin  HR control reasonable  CHF markedly improved Will transfer to floor and continue IV diuresis for another 24 hrs Increase rate control At this juncture AV ablation has strategy although not necessarily acute therapy is reasonable consideration  wound care nurse has seen   Signed, Virl Axe MD  07/05/2014   

## 2014-07-06 DIAGNOSIS — R5381 Other malaise: Secondary | ICD-10-CM

## 2014-07-06 DIAGNOSIS — R627 Adult failure to thrive: Secondary | ICD-10-CM

## 2014-07-06 DIAGNOSIS — J9610 Chronic respiratory failure, unspecified whether with hypoxia or hypercapnia: Secondary | ICD-10-CM

## 2014-07-06 LAB — BASIC METABOLIC PANEL
Anion gap: 8 (ref 5–15)
BUN: 17 mg/dL (ref 6–23)
CO2: 33 mmol/L — ABNORMAL HIGH (ref 19–32)
Calcium: 8.5 mg/dL (ref 8.4–10.5)
Chloride: 99 mEq/L (ref 96–112)
Creatinine, Ser: 0.97 mg/dL (ref 0.50–1.35)
GFR, EST NON AFRICAN AMERICAN: 78 mL/min — AB (ref >90)
Glucose, Bld: 87 mg/dL (ref 70–99)
POTASSIUM: 4.1 mmol/L (ref 3.5–5.1)
SODIUM: 140 mmol/L (ref 135–145)

## 2014-07-06 LAB — GLUCOSE, CAPILLARY
Glucose-Capillary: 84 mg/dL (ref 70–99)
Glucose-Capillary: 215 mg/dL — ABNORMAL HIGH (ref 70–99)
Glucose-Capillary: 192 mg/dL — ABNORMAL HIGH (ref 70–99)
Glucose-Capillary: 165 mg/dL — ABNORMAL HIGH (ref 70–99)

## 2014-07-06 LAB — DIGOXIN LEVEL: Digoxin Level: 1.2 ng/mL (ref 0.8–2.0)

## 2014-07-06 MED ORDER — FUROSEMIDE 40 MG PO TABS
60.0000 mg | ORAL_TABLET | Freq: Two times a day (BID) | ORAL | Status: DC
  Administered 2014-07-06 – 2014-07-17 (×21): 60 mg via ORAL
  Filled 2014-07-06 (×27): qty 1

## 2014-07-06 MED ORDER — DIGOXIN 0.0625 MG HALF TABLET
0.0625 mg | ORAL_TABLET | Freq: Every day | ORAL | Status: DC
  Filled 2014-07-06: qty 1

## 2014-07-06 MED ORDER — INSULIN ASPART 100 UNIT/ML ~~LOC~~ SOLN
0.0000 [IU] | Freq: Three times a day (TID) | SUBCUTANEOUS | Status: DC
  Administered 2014-07-06 – 2014-07-07 (×3): 3 [IU] via SUBCUTANEOUS
  Administered 2014-07-08: 2 [IU] via SUBCUTANEOUS
  Administered 2014-07-08: 5 [IU] via SUBCUTANEOUS
  Administered 2014-07-08: 3 [IU] via SUBCUTANEOUS
  Administered 2014-07-09: 5 [IU] via SUBCUTANEOUS
  Administered 2014-07-09: 3 [IU] via SUBCUTANEOUS
  Administered 2014-07-10: 2 [IU] via SUBCUTANEOUS
  Administered 2014-07-10 (×2): 5 [IU] via SUBCUTANEOUS
  Administered 2014-07-11: 2 [IU] via SUBCUTANEOUS
  Administered 2014-07-11: 5 [IU] via SUBCUTANEOUS
  Administered 2014-07-12: 2 [IU] via SUBCUTANEOUS
  Administered 2014-07-12 – 2014-07-14 (×5): 3 [IU] via SUBCUTANEOUS
  Administered 2014-07-14: 2 [IU] via SUBCUTANEOUS
  Administered 2014-07-15 – 2014-07-17 (×4): 3 [IU] via SUBCUTANEOUS

## 2014-07-06 MED ORDER — INSULIN ASPART 100 UNIT/ML ~~LOC~~ SOLN: 0.0000 [IU] | Freq: Every day | SUBCUTANEOUS | Status: DC

## 2014-07-06 MED ORDER — ENSURE PUDDING PO PUDG
1.0000 | Freq: Three times a day (TID) | ORAL | Status: DC
  Administered 2014-07-06 – 2014-07-10 (×11): 1 via ORAL

## 2014-07-06 NOTE — Plan of Care (Signed)
Problem: Phase I Progression Outcomes Goal: Voiding-avoid urinary catheter unless indicated Outcome: Not Applicable Date Met:  91/63/84 Patient is required to in/out cath at home and currently has indwelling foley

## 2014-07-06 NOTE — Progress Notes (Signed)
Inpatient Diabetes Program Recommendations  AACE/ADA: New Consensus Statement on Inpatient Glycemic Control (2013)  Target Ranges:  Prepandial:   less than 140 mg/dL      Peak postprandial:   less than 180 mg/dL (1-2 hours)      Critically ill patients:  140 - 180 mg/dL  Results for DELRON, COMER (MRN [id number]) as of 07/06/2014 13:48  Ref. Range 07/05/2014 16:12 07/05/2014 20:38 07/05/2014 23:33 07/06/2014 04:26 07/06/2014 11:57  Glucose-Capillary Latest Range: 70-99 mg/dL 220 (H) 252 (H) 100 (H) 84 215 (H)   Inpatient Diabetes Program Recommendations Diet: add carb modified to current heart healthy diet  Thank you  Raoul Pitch BSN, RN,CDE Inpatient Diabetes Coordinator [phone] (team pager)

## 2014-07-06 NOTE — Progress Notes (Signed)
Patient Name: [name]      SUBJECTIVE:   Admitted on 06-27-14 with shortness of breath attributed to COPD exacerbation and AFib with rapid rate aggravated by significant anemia   His past medical history is significant for CAD (s/p CABG), ischemic cardiomypothy (s/p CRTD), permanent atrial fibrillation (not on anticoagulation due to prior subdural hematoma- notes from 2013), modereate to severe MR congestive heart failure and ventricular tachycardia. Last echo 08/2013 demonstrated EF 20-25% with diffuse hypokinesis, moderate to severe MR, biatrial enlargement, PA pressure 45      He has chronic anemia that has been followed by Dr Benay Spice. On admission, his Hgb was noted to be 7 which is lower than his baseline. FOBT positive, endoscopy negative, unable to do colonoscopy secondary to tachycardia. Transfused 2 units  CRTD interrogation>>>demonstrated normal device function.( He had an ICD shock on 06-11-14 for what appears to be AF with RVR (morphology template matches underlying R waves when patient in AF).)  He was previously on amiodarone but this was discontinued secondary to concerns about lung toxicity. It is not clear if amio lung toxicity was actually present, but the patient improved after discontinuing medication and a round of antibiotics and prednisone (notes from 2010).   Treated with dig for rate control with some improvement. AV ablation broached but not decided upon. Metoprolol added 1/1 I/O had been balanced  Diuretics made more aggressive          Past Medical History  Diagnosis Date  . PVD (peripheral vascular disease)   . HTN (hypertension)   . CHF (congestive heart failure)   . Ventricular tachycardia   . Ischemic heart disease, chronic   . Dyslipidemia   . CAD (coronary artery disease)     a. s/p CABG, last cath 2012 -severe ischemic cardiomyopathy with EF< 15%, patent vein grafts  . Self-catheterizes urinary bladder     since back  surgery.  6- 7 times a day. (05/27/2014)  . Subdural hematoma 2013    a. anticoagulation discontinued at that time  . Neurogenic bladder     since back surgery - self caths  . Neurogenic bowel     digitally stimulates daily.  Marland Kitchen COPD (chronic obstructive pulmonary disease)     a. on home O2  . Emphysema   . Anemia     SERUM MONOCLONAL IgM KAPPA PROTEIN - NO CLINICAL EVIDENCE FOR PROGRESSION TO MULTIPLE MYELOMA OR OTHER PROLIFERATIVE DISORDER - PER OFFICE NOTE DR. SHERRILL DATED 12/30/13  . Pneumonia X 2  . Borderline type 2 diabetes mellitus   . TIA (transient ischemic attack) 1980's  . Colon cancer   . MRSA infection   . Permanent atrial fibrillation     Scheduled Meds:  Scheduled Meds: . budesonide  0.5 mg Nebulization BID  . digoxin  0.125 mg Oral Daily  . furosemide  40 mg Intravenous BID  . guaiFENesin  600 mg Oral BID  . insulin aspart  0-9 Units Subcutaneous 6 times per day  . levalbuterol  0.63 mg Nebulization TID  . metoprolol tartrate  50 mg Oral BID  . pantoprazole  40 mg Oral BID  . predniSONE  10 mg Oral Q breakfast  . tiotropium  18 mcg Inhalation Daily   Continuous Infusions:  acetaminophen **OR** acetaminophen, ALPRAZolam, alum & mag hydroxide-simeth, HYDROcodone-acetaminophen, HYDROmorphone (DILAUDID) injection, ondansetron **OR** ondansetron (ZOFRAN) IV, oxyCODONE    PHYSICAL EXAM Filed Vitals:   07/05/14 2032 07/05/14 2210 07/05/14 2351 07/06/14  0443  BP: 108/46  109/72 118/63  Pulse: 106  81 99  Temp: 98.1 F (36.7 C)   98.2 F (36.8 C)  TempSrc: Oral   Oral  Resp: 18   18  Height:      Weight:    148 lb 9.6 oz (67.405 kg)  SpO2: 100% 100%  100%   Well developed and nourished in no acute distress HENT normal Neck supple with JVP-<10 Clear decreased l base Irregularly iregulr with 2/6 systolic murmur  Abd-soft with active BS No Clubbing cyanosis edema Skin-warm and dry A & Oriented  Grossly normal sensory and motor function   TELEMETRY:  Reviewed telemetry pt in Afib  HR 80-110   Intake/Output Summary (Last 24 hours) at 07/06/14 0809 Last data filed at 07/05/14 2038  Gross per 24 hour  Intake    200 ml  Output    500 ml  Net   -300 ml    LABS: Basic Metabolic Panel:  Recent Labs Lab 06/30/14 0545 07/01/14 0156 07/02/14 0242 07/04/14 0205 07/05/14 0225 07/06/14 0410  NA 136 138 137 138 137 140  K 4.6 4.5 4.5 4.3 4.3 4.1  CL 102 103 104 104 101 99  CO2 $Re'26 28 26 25 'DeF$ 33* 33*  GLUCOSE 108* 141* 127* 196* 94 87  BUN $Re'19 18 22 17 16 17  'ROb$ CREATININE 0.97 0.88 1.02 0.81 0.79 0.97  CALCIUM 8.5 8.3* 8.3* 8.4 8.3* 8.5   Cardiac Enzymes: No results for input(s): CKTOTAL, CKMB, CKMBINDEX, TROPONINI in the last 72 hours. CBC:  Recent Labs Lab 06/30/14 0545 07/01/14 0156 07/02/14 0242 07/04/14 0205  WBC 7.1 7.2 7.5 9.1  HGB 8.9* 8.3* 8.6* 9.0*  HCT 28.0* 26.6* 27.9* 28.9*  MCV 96.2 93.3 94.3 96.3  PLT 235 219 218 221   PROTIME: No results for input(s): LABPROT, INR in the last 72 hours. Liver Function Tests: No results for input(s): AST, ALT, ALKPHOS, BILITOT, PROT, ALBUMIN in the last 72 hours. No results for input(s): LIPASE, AMYLASE in the last 72 hours. BNP: BNP (last 3 results)  Recent Labs  05/27/14 1430 06/03/14 1328  PROBNP 1578.0* 4268.0*   D-Dimer: No results for input(s): DDIMER in the last 72 hours. Hemoglobin A1C: No results for input(s): HGBA1C in the last 72 hours. Fasting Lipid Panel: No results for input(s): CHOL, HDL, LDLCALC, TRIG, CHOLHDL, LDLDIRECT in the last 72 hours. Thyroid Function Tests: No results for input(s): TSH, T4TOTAL, T3FREE, THYROIDAB in the last 72 hours.  Invalid input(s): FREET3 Anemia Panel: No results for input(s): VITAMINB12, FOLATE, FERRITIN, TIBC, IRON, RETICCTPCT in the last 72 hours.  Dig level 1.2    ASSESSMENT AND PLAN:  Principal Problem:   Symptomatic anemia Active Problems:   Cardiomyopathy, ischemic- EF 20-25% 2D 08/08/13 Mitral regurg  mod -severe   Persistent atrial fibrillation   COPD (chronic obstructive pulmonary disease) with emphysema   Biventricular automatic implantable cardioverter defibrillator in situ   Acute on chronic systolic heart failure, NYHA class 3   Decubitus ulcer of coccygeal region-excision and closure Nov 2015   Elevated troponin  HR control reasonable  CHF markedly improved Will transfer to floor and continue IV diuresis for another 24 hrs Increase rate control  Very weak  Will get OT and PT to seee?rehab Will also ask pulmonary to see  Decrease dig dose Signed, Virl Axe MD  07/06/2014

## 2014-07-06 NOTE — Progress Notes (Signed)
Physical Therapy Treatment Patient Details Name: [name] MRN: [id number] DOB: [birth year] Today's Date: 07/06/2014    History of Present Illness [name] is a 79 y.o. male with a history of CAD, Ischemic Cardiomyopathy, Systolic CHF, COPD, Chronic Respiratory Failure , HTN , Hyperlipidemia, DM2, Remote history of Colon Cancer who presents to the ED with complaints of SOB and Chest pain . Shortness of breath was transient and improved after being placed on BiPAP in the ER. BP was 235 systolic HR 573-220. He was found to be anemic. On 12/30 patient moved to ICU secondary to Medical issues including c/o of chest pain, short of breath, Pt HR sustaining 150s-170s and elevated troponin.     PT Comments    Pt is significantly deconditioned from medical status and prolonged hospital stay.  He is very determined to get stronger and is adamant about going home and receiving HHPT at discharge (he has had a bad experience in the past with rehab centers and does not want to Bradley to one).  He needs to walk more acutely to build his strength.  PT will continue to follow acutely to progress gait, mobility, and UE/LE exercises.    Follow Up Recommendations  Home health PT [facility] aide, used [name], requesting [name](aide), [name] (PT))     Equipment Recommendations  None recommended by PT    Recommendations for Other Services   NA     Precautions / Restrictions Precautions Precautions: Fall Precaution Comments: pt is generally weak and deconditioned.     Mobility  Bed Mobility Overal bed mobility: Modified Independent Bed Mobility: Supine to Sit     Supine to sit: Modified independent (Device/Increase time)     General bed mobility comments: Pt using bed rail for leverage, but able to get himself EOB given increased time.   Transfers Overall transfer level: Needs assistance Equipment used: Rolling walker (2 wheeled) Transfers: Sit to/from Stand Sit to Stand: +2 physical  assistance;Min assist         General transfer comment: Pt was able to stand from elevated bed with min assist and use of upper extremities for leverage.  He had more difficulty from lower straight back chair in room later in the session (after fatigued) and required two person min assist to get to standing from this level.  heavy reliance on arms for transitions.  Verbal cues for safe hand placement and assist needed to control descent to sit slowly.   Ambulation/Gait Ambulation/Gait assistance: Min assist Ambulation Distance (Feet): 60 Feet Assistive device: Rolling walker (2 wheeled) Gait Pattern/deviations: Step-through pattern;Shuffle Gait velocity: decreased Gait velocity interpretation: Below normal speed for age/gender General Gait Details: Pt with slow, shuffling gait pattern. Relying on walker for balance and support of trunk over weak legs.  Stopped to turn at the desk and pt had 1-2 small LOB posteriorly requiring min assist to prevent a fall. He fatigues very quickly. 2 L O2 [place] used during gait as he uses 2-2.5 L O2 [place] at home 24/7.        Balance Overall balance assessment: Needs assistance Sitting-balance support: Feet supported;No upper extremity supported Sitting balance-Leahy Scale: Good     Standing balance support: Bilateral upper extremity supported Standing balance-Leahy Scale: Poor Standing balance comment: pt needs external assist to maintain balance in standing.                     Cognition Arousal/Alertness: Awake/alert Behavior During Therapy: WFL for tasks assessed/performed Overall Cognitive  Status: Within Functional Limits for tasks assessed                      Exercises General Exercises - Upper Extremity Shoulder Flexion: AROM;Both;10 reps;Seated Elbow Flexion: AROM;Both;10 reps;Seated General Exercises - Lower Extremity Long Arc Quad: AROM;Both;10 reps;Seated Hip ABduction/ADduction: AROM;Both;10 reps;Seated (adduction only  against pillow for resistance) Hip Flexion/Marching: AROM;Both;10 reps;Seated Toe Raises: AROM;Both;10 reps;Seated Heel Raises: AROM;Both;10 reps;Seated Other Exercises Other Exercises: chair push ups 8 reps, encouraged these to practice sit to stand initiation and for pressure relief.   Other Exercises: Advised pt not to sit up longer than 30-45 mins as we do not want to make his sacral wound worse.  RN tech informed and pt placed on pillows in seat of chair for extra padding.     General Comments General comments (skin integrity, edema, etc.): HR remained in upper 90s to 110s during session, O2 sats mid 90s on 2 L O2 [place] during gait.        Pertinent Vitals/Pain Pain Assessment: No/denies pain           PT Goals (current goals can now be found in the care plan section) Acute Rehab PT Goals Patient Stated Goal: home sooner than later Time For Goal Achievement: 07/16/14 Progress towards PT goals: Progressing toward goals    Frequency  Min 3X/week    PT Plan Current plan remains appropriate       End of Session Equipment Utilized During Treatment: Gait belt;Oxygen Activity Tolerance: Patient limited by fatigue Patient left: in chair;with call bell/phone within reach;with family/visitor present     Time: 5631-4970 PT Time Calculation (min) (ACUTE ONLY): 46 min  Charges:  $Gait Training: 8-22 mins $Therapeutic Exercise: 8-22 mins $Self Care/Home Management: 8-22                      [name] B. [name], PT, DPT [phone]   07/06/2014, 4:38 PM

## 2014-07-06 NOTE — Consult Note (Signed)
Name: [name] MRN: [id number] DOB: 12-23-1935    ADMISSION DATE:  06/27/2014 CONSULTATION DATE:  07/06/14  REFERRING MD :  Dr. Sallyanne Kuster   CHIEF COMPLAINT:  AECOPD   BRIEF PATIENT DESCRIPTION: 79yo former smoker male with extensive PMH including COPD (followed by [facility]), Afib, ischemic cardiomyopathy, HTN, DM, recent hospitalization for acute on chronic respiratory failure r/t AECOPD and acute on chronic sCHF +/- HCAP.   Returned 12/27 with SOB requiring bipap, also with melena.  Improved initially and was admitted by [facility] with acute on chronic respiratory failure and significant anemia.   SIGNIFICANT EVENTS    STUDIES:  CXR -- L basilar atx   HISTORY OF PRESENT ILLNESS: 79 y/o former smoker (up to 3pdd prior to cessation in 2004) male with extensive PMH including COPD, Afib, ischemic cardiomyopathy, HTN, DM, recent hospitalization for acute on chronic respiratory failure r/t AECOPD and acute on chronic sCHF +/- HCAP.   Re-admitted on12/27 with SOB requiring bipap, chest pain and melena.  Improved initially and was admitted by [facility] with acute on chronic respiratory failure and significant anemia (hgb 6.2).  The patient had noted dark black stools.  FOBT was positive, endoscopy negative but unable to do colonoscopy due to tachycardia.  Hospital course complicated by Afib with RVR. He was seen and evaluated by Dr. Lovena Le for Afib RVR and was treated with lasix and judicious lopressor.  Currently he complaints of mild SOB above baseline, malaise, significant weakness.  Denies fevers, chills, hemoptysis, purulent sputum, leg/calf pain.    PAST MEDICAL HISTORY :   has a past medical history of PVD (peripheral vascular disease); HTN (hypertension); CHF (congestive heart failure); Ventricular tachycardia; Ischemic heart disease, chronic; Dyslipidemia; CAD (coronary artery disease); Self-catheterizes urinary bladder; Subdural hematoma (2013); Neurogenic bladder; Neurogenic bowel; COPD  (chronic obstructive pulmonary disease); Emphysema; Anemia; Pneumonia (X 2); Borderline type 2 diabetes mellitus; TIA (transient ischemic attack) (1980's); Colon cancer; MRSA infection; and Permanent atrial fibrillation.  has past surgical history that includes Lumbar disc surgery (X 2); Iliac artery stent (Left); Bi-ventricular implantable cardioverter defibrillator  (crt-d) (04-10-08); Colonoscopy; Polypectomy; Insertion of iliac stent (Right, 06/04/2012); Cardiac catheterization (2012); Bi-ventricular implantable cardioverter defibrillator  (crt-d) (2009; 2014); Endarterectomy femoral (06/28/2012); Patch angioplasty (06/28/2012); Colon surgery (03/2004); Cataract extraction w/ intraocular lens  implant, bilateral (Bilateral); Cholecystectomy; Coronary artery bypass graft (1998); Femoral-popliteal Bypass Graft (Right, 03/28/2013); Debridment of decubitus ulcer (N/A, 05/18/2014); Posterior lumbar fusion (X 1); abdominal aortagram (N/A, 02/21/2012); abdominal aortagram (N/A, 06/04/2012); Biv icd genertaor change out (N/A, 10/22/2012); abdominal aortagram (N/A, 03/18/2013); abdominal aortagram (N/A, 07/02/2013); lower extremity angiogram (Bilateral, 07/02/2013); percutaneous stent intervention (Right, 07/02/2013); and Esophagogastroduodenoscopy (egd) with propofol (N/A, 06/29/2014).    HOME MEDICATIONS: Prior to Admission medications   Medication Sig Start Date End Date Taking? Authorizing Provider  acetaminophen (TYLENOL) 325 MG tablet Take 2 tablets (650 mg total) by mouth every 4 (four) hours as needed for headache or mild pain. 08/09/13  Yes Luke K Kilroy, PA-C  albuterol (PROVENTIL HFA;VENTOLIN HFA) 108 (90 BASE) MCG/ACT inhaler Inhale 2 puffs into the lungs every 6 (six) hours as needed for wheezing or shortness of breath.  10/02/12  Yes Kathee Delton, MD  albuterol (PROVENTIL) (2.5 MG/3ML) 0.083% nebulizer solution Take 2.5 mg by nebulization 4 (four) times daily.    Yes Historical Provider, MD  ALPRAZolam  (XANAX) 0.25 MG tablet Take 1 tablet (0.25 mg total) by mouth 2 (two) times daily as needed for anxiety (sob). 06/09/14  Yes Ethel,  MD  aspirin EC 325 MG tablet Take 325 mg by mouth daily.   Yes Historical Provider, MD  budesonide (PULMICORT) 0.5 MG/2ML nebulizer solution Take 0.5 mg by nebulization 2 (two) times daily.    Yes Historical Provider, MD  candesartan (ATACAND) 32 MG tablet Take 32 mg by mouth daily.   Yes Historical Provider, MD  digoxin (LANOXIN) 0.125 MG tablet Take 0.125 mg by mouth every morning.   Yes Historical Provider, MD  diltiazem (CARDIZEM CD) 180 MG 24 hr capsule Take 1 capsule (180 mg total) by mouth daily. 06/09/14  Yes Costin Karlyne Greenspan, MD  ferrous sulfate 325 (65 FE) MG tablet Take 325 mg by mouth at bedtime.    Yes Historical Provider, MD  furosemide (LASIX) 40 MG tablet TAKE 2 TABLETS IN THE MORNING AND 1 TABLET IN THE EVENING 04/23/14  Yes Kathee Delton, MD  GLUCERNA (GLUCERNA) LIQD Take 237 mLs by mouth 3 (three) times daily between meals.   Yes Historical Provider, MD  guaiFENesin (MUCINEX) 600 MG 12 hr tablet Take 600 mg by mouth 2 (two) times daily. congestion   Yes Historical Provider, MD  Hydrocodone-Acetaminophen 5-300 MG TABS Take 1 tablet by mouth every 6 (six) hours as needed (pain).   Yes Historical Provider, MD  irbesartan (AVAPRO) 150 MG tablet Take 1 tablet (150 mg total) by mouth daily. 06/09/14  Yes Costin Karlyne Greenspan, MD  KLOR-CON M20 20 MEQ tablet TAKE 1 TABLET DAILY 02/17/14  Yes Kathee Delton, MD  megestrol (MEGACE) 20 MG tablet Take 1 tablet (20 mg total) by mouth 2 (two) times daily. 06/09/14  Yes Costin Karlyne Greenspan, MD  mupirocin ointment (BACTROBAN) 2 % Apply 1 application topically 2 (two) times daily as needed (Cuts and bruises).   Yes Historical Provider, MD  nitroGLYCERIN (NITRODUR - DOSED IN MG/24 HR) 0.2 mg/hr patch Place 1 patch onto the skin daily.   Yes Historical Provider, MD  nitroGLYCERIN (NITROSTAT) 0.4 MG SL tablet Place 0.4 mg  under the tongue every 5 (five) minutes as needed for chest pain.   Yes Historical Provider, MD  predniSONE (DELTASONE) 10 MG tablet Take 1 tablet (10 mg total) by mouth daily. Take 20 mg (2 tablets) for 4 days then return to 10 mg daily Patient taking differently: Take 10 mg by mouth daily.  06/09/14  Yes Costin Karlyne Greenspan, MD  PRESCRIPTION MEDICATION Uses 1L-2L of home Oxygen   Yes Historical Provider, MD  simvastatin (ZOCOR) 20 MG tablet Take 20 mg by mouth at bedtime.    Yes Historical Provider, MD  tiotropium (SPIRIVA) 18 MCG inhalation capsule Place 18 mcg into inhaler and inhale daily.    Yes Historical Provider, MD  aspirin EC 325 MG tablet Take 325 mg by mouth at bedtime.    Historical Provider, MD  collagenase (SANTYL) ointment Apply topically 2 (two) times daily. Patient not taking: Reported on 06/28/2014 06/02/14   Orson Eva, MD  feeding supplement, ENSURE COMPLETE, (ENSURE COMPLETE) LIQD Take 237 mLs by mouth 2 (two) times daily between meals. Patient not taking: Reported on 06/28/2014 05/29/14   Orson Eva, MD  HYDROcodone-acetaminophen (VICODIN) 2.5-500 MG per tablet Take 1 tablet by mouth every 6 (six) hours as needed for pain.    Historical Provider, MD  levofloxacin (LEVAQUIN) 500 MG tablet Take 1 tablet (500 mg total) by mouth daily. Patient not taking: Reported on 06/28/2014 06/09/14   Caren Griffins, MD  PROVENTIL HFA 108 (90 BASE) MCG/ACT inhaler USE 1 TO  2 INHALATIONS INTO THE LUNGS EVERY 6 HOURS AS NEEDED FOR SHORTNESS OF BREATH Patient not taking: Reported on 06/28/2014 06/24/14   Kathee Delton, MD   No Known Allergies  FAMILY HISTORY:  family history includes Diabetes in his father; Heart disease in his father and mother; Heart failure in his father; Hyperlipidemia in his father and mother; Hypertension in his father and mother.   SOCIAL HISTORY:  reports that he quit smoking about 12 years ago. His smoking use included Cigarettes. He has a 144 pack-year smoking  history. He has never used smokeless tobacco. He reports that he drinks alcohol. He reports that he does not use illicit drugs.  REVIEW OF SYSTEMS:   As per HPI - All other systems reviewed and were neg.    SUBJECTIVE:   VITAL SIGNS: Temp:  [98 F (36.7 C)-98.2 F (36.8 C)] 98.2 F (36.8 C) (01/04 0443) Pulse Rate:  [81-106] 99 (01/04 0443) Resp:  [18-22] 18 (01/04 0443) BP: (108-118)/(46-72) 118/63 mmHg (01/04 0443) SpO2:  [95 %-100 %] 100 % (01/04 0443) Weight:  [148 lb 9.6 oz (67.405 kg)] 148 lb 9.6 oz (67.405 kg) (01/04 0443)  PHYSICAL EXAMINATION: General:  Frail elderly male in NAD Neuro:  AAOx4, speech clear, MAE  HEENT:  Mm pink/moist, no jvd  Cardiovascular:  s1s2 rrr, distant  Lungs:  resp's even/non-labored on 2L [place], L posterior with fine crackles, R distant but clear  Abdomen:  Round/soft, bsx4 active  Musculoskeletal:  No acute deformities  Skin:  Warm/dry, no edema, multiple scattered bruises    Recent Labs Lab 07/04/14 0205 07/05/14 0225 07/06/14 0410  NA 138 137 140  K 4.3 4.3 4.1  CL 104 101 99  CO2 25 33* 33*  BUN 17 16 17   CREATININE 0.81 0.79 0.97  GLUCOSE 196* 94 87    Recent Labs Lab 07/01/14 0156 07/02/14 0242 07/04/14 0205  HGB 8.3* 8.6* 9.0*  HCT 26.6* 27.9* 28.9*  WBC 7.2 7.5 9.1  PLT 219 218 221   No results found.  ASSESSMENT / PLAN:   Acute Exacerbation of COPD - followed by Dr. Gwenette Greet.   Former heavy Tobacco Abuse  Plan: Continue baseline regimen:  Pulmicort, Spiriva, Prednisone 10 mg QD Xopenex TID  Lasix as below  Mucinex  Pulmonary hygiene:  IS, mobilize as able (PT consult) Wean O2 for saturations 90-94%   AFib with RVR - permanent, not on anticoagulation due to prior SDH.  Amiodarone d/c'd previously out of concern for toxicity.   Mod-Severe MR CHF VT   Plan: Lopressor, digoxin per Cardiology  Lasix 60 mg BID   Anemia - followed by Dr. Benay Spice   Plan: Per primary SVC Monitor stool for further  evidence of bleeding    Failure to Thrive  Protein Calorie Malnutrition  Sacral Decubitus   Plan: Add ensure TID  Noe Gens, NP-C [facility] Pgr: [phone] or [phone]    07/06/2014, 11:03 AM  PCCM ATTENDING: I have reviewed pt's initial presentation, consultants notes and hospital database in detail.  The above assessment and plan was formulated under my direction.  In summary: Multiple medical problems as documented above H/O COPD followed by [facility] in office His biggest problem appears to be profound deconditioning - acute on chronic He does not appear to be in throes of a COPD exacerbation. There are no wheezes and he is comfortable @ rest Cont COPD regimen as outlined above I have clarified SSI regimen (was on q 4 hrs -  changed to ACHS) I have asked for PT eval/rx. HE might require SNF after discharge or at least PT post discharge I have asked Care Manger to asess home health needs  PCCM will sign off. Please call if we can be of further assistance   Merton Border, MD ; [facility] [phone].  After 5:30 PM or weekends, call [phone]

## 2014-07-07 DIAGNOSIS — I481 Persistent atrial fibrillation: Secondary | ICD-10-CM

## 2014-07-07 DIAGNOSIS — L89154 Pressure ulcer of sacral region, stage 4: Secondary | ICD-10-CM

## 2014-07-07 LAB — GLUCOSE, CAPILLARY
GLUCOSE-CAPILLARY: 173 mg/dL — AB (ref 70–99)
Glucose-Capillary: 118 mg/dL — ABNORMAL HIGH (ref 70–99)
Glucose-Capillary: 196 mg/dL — ABNORMAL HIGH (ref 70–99)
Glucose-Capillary: 198 mg/dL — ABNORMAL HIGH (ref 70–99)

## 2014-07-07 LAB — BASIC METABOLIC PANEL
Anion gap: 6 (ref 5–15)
BUN: 23 mg/dL (ref 6–23)
CHLORIDE: 96 meq/L (ref 96–112)
CO2: 35 mmol/L — AB (ref 19–32)
Calcium: 8.2 mg/dL — ABNORMAL LOW (ref 8.4–10.5)
Creatinine, Ser: 1.09 mg/dL (ref 0.50–1.35)
GFR calc Af Amer: 74 mL/min — ABNORMAL LOW (ref >90)
GFR calc non Af Amer: 64 mL/min — ABNORMAL LOW (ref >90)
Glucose, Bld: 141 mg/dL — ABNORMAL HIGH (ref 70–99)
Potassium: 4.6 mmol/L (ref 3.5–5.1)
Sodium: 137 mmol/L (ref 135–145)

## 2014-07-07 MED ORDER — LEVALBUTEROL HCL 0.63 MG/3ML IN NEBU: 0.6300 mg | INHALATION_SOLUTION | Freq: Four times a day (QID) | RESPIRATORY_TRACT | Status: DC | PRN

## 2014-07-07 MED ORDER — DIGOXIN 125 MCG PO TABS
0.1250 mg | ORAL_TABLET | Freq: Every day | ORAL | Status: DC
  Administered 2014-07-07 – 2014-07-17 (×11): 0.125 mg via ORAL
  Filled 2014-07-07 (×11): qty 1

## 2014-07-07 NOTE — Progress Notes (Signed)
Received prescreen request for inpatient rehab from OT and have reviewed pt's case. I noted that prior PT recommendations were for home with home health support and that pt has ambulated 50' with min guard assist. I also have noted pt's impairments with ADL tasks, needing maximal assistance and having fatigue.  Both PT and OT have documented that pt and his wife are adamant about pt returning home with home health support.   At this time, it is our recommendation to pursue home with home health support in light of pt/wife preference and due to pt needing min guard assist with general mobility.   Thanks.  Nanetta Batty, PT Rehabilitation Admissions Coordinator [phone]

## 2014-07-07 NOTE — Progress Notes (Signed)
Subjective: No complaints. Denies dyspnea, CP and palpitations.   Objective: Vital signs in last 24 hours: Temp:  [97.3 F (36.3 C)-98.9 F (37.2 C)] 98.4 F (36.9 C) (01/05 0545) Pulse Rate:  [84-112] 84 (01/05 0545) Resp:  [17-19] 17 (01/05 0545) BP: (98-117)/(52-62) 117/57 mmHg (01/05 0545) SpO2:  [97 %-100 %] 100 % (01/05 0545) Weight:  [145 lb 1.6 oz (65.817 kg)] 145 lb 1.6 oz (65.817 kg) (01/05 0545) Last BM Date: 07/06/13  Intake/Output from previous day: 01/04 0701 - 01/05 0700 In: -  Out: 650 [Urine:650] Intake/Output this shift:    Medications Current Facility-Administered Medications  Medication Dose Route Frequency Provider Last Rate Last Dose  . acetaminophen (TYLENOL) tablet 650 mg  650 mg Oral Q6H PRN Theressa Millard, MD       Or  . acetaminophen (TYLENOL) suppository 650 mg  650 mg Rectal Q6H PRN Theressa Millard, MD      . ALPRAZolam Duanne Moron) tablet 0.25 mg  0.25 mg Oral BID PRN Debbe Odea, MD   0.25 mg at 07/06/14 2222  . alum & mag hydroxide-simeth (MAALOX/MYLANTA) 200-200-20 MG/5ML suspension 30 mL  30 mL Oral Q6H PRN Theressa Millard, MD      . budesonide (PULMICORT) nebulizer solution 0.5 mg  0.5 mg Nebulization BID Debbe Odea, MD   0.5 mg at 07/06/14 1923  . digoxin (LANOXIN) tablet 0.0625 mg  0.0625 mg Oral Daily Deboraha Sprang, MD      . feeding supplement (ENSURE) (ENSURE) pudding 1 Container  1 Container Oral TID BM Donita Brooks, NP   1 Container at 07/06/14 2000  . furosemide (LASIX) tablet 60 mg  60 mg Oral BID Deboraha Sprang, MD   60 mg at 07/06/14 1657  . guaiFENesin (MUCINEX) 12 hr tablet 600 mg  600 mg Oral BID Debbe Odea, MD   600 mg at 07/06/14 2216  . HYDROcodone-acetaminophen (NORCO/VICODIN) 5-325 MG per tablet 1 tablet  1 tablet Oral Q6H PRN Debbe Odea, MD   1 tablet at 07/07/14 0300  . HYDROmorphone (DILAUDID) injection 0.5-1 mg  0.5-1 mg Intravenous Q3H PRN Theressa Millard, MD   1 mg at 07/06/14 1311  . insulin  aspart (novoLOG) injection 0-15 Units  0-15 Units Subcutaneous TID WC Wilhelmina Mcardle, MD   3 Units at 07/06/14 1658  . insulin aspart (novoLOG) injection 0-5 Units  0-5 Units Subcutaneous QHS Wilhelmina Mcardle, MD   0 Units at 07/06/14 2321  . levalbuterol (XOPENEX) nebulizer solution 0.63 mg  0.63 mg Nebulization TID Sanda Klein, MD   0.63 mg at 07/06/14 1923  . metoprolol (LOPRESSOR) tablet 50 mg  50 mg Oral BID Evans Lance, MD   50 mg at 07/06/14 2216  . ondansetron (ZOFRAN) tablet 4 mg  4 mg Oral Q6H PRN Theressa Millard, MD   4 mg at 07/05/14 1022   Or  . ondansetron (ZOFRAN) injection 4 mg  4 mg Intravenous Q6H PRN Theressa Millard, MD   4 mg at 07/06/14 0941  . oxyCODONE (Oxy IR/ROXICODONE) immediate release tablet 5 mg  5 mg Oral Q4H PRN Theressa Millard, MD   5 mg at 07/03/14 0934  . pantoprazole (PROTONIX) EC tablet 40 mg  40 mg Oral BID Geradine Girt, DO   40 mg at 07/06/14 2216  . predniSONE (DELTASONE) tablet 10 mg  10 mg Oral Q breakfast Debbe Odea, MD   10 mg at 07/06/14 0811  .  tiotropium (SPIRIVA) inhalation capsule 18 mcg  18 mcg Inhalation Daily Debbe Odea, MD   18 mcg at 07/06/14 0909    PE: General appearance: alert, cooperative and no distress Neck: no carotid bruit and no JVD Lungs: decreased BS at the bases. No rales. No wheezes.  Heart: irregularly irregular rhythm and 1/6 SM Extremities: no LEE Pulses: 2+ and symmetric Skin: warm and dry Neurologic: Grossly normal  Lab Results:  No results for input(s): WBC, HGB, HCT, PLT in the last 72 hours. BMET  Recent Labs  07/05/14 0225 07/06/14 0410 07/07/14 0534  NA 137 140 137  K 4.3 4.1 4.6  CL 101 99 96  CO2 33* 33* 35*  GLUCOSE 94 87 141*  BUN 16 17 23   CREATININE 0.79 0.97 1.09  CALCIUM 8.3* 8.5 8.2*     Assessment/Plan    Principal Problem:   Symptomatic anemia Active Problems:   Coronary atherosclerosis   Cardiomyopathy, ischemic- EF 20-25% 2D 08/08/13   Persistent atrial  fibrillation   COPD (chronic obstructive pulmonary disease) with emphysema   Biventricular automatic implantable cardioverter defibrillator in situ - MDT   Acute on chronic systolic heart failure, NYHA class 3   Decubitus ulcer of coccygeal region-excision and closure Nov 2015   Hyperkalemia   SOB (shortness of breath)   Sinus tachycardia   Hypotension   AKI (acute kidney injury)   Elevated troponin   1. Atrial Fibrillation: ventricular rate is controlled in the 90s. Asymptomatic. Continue Lopressor and digoxin for rate control. No anticoagulation given recent anemia.   2. Chronic Systolic CHF: acute exacerbation improved. Breathing has improved. Near euvolemic state. Continue daily lasix. Continue cardioselective BB and digoxin. ? Adding low dose ACE. Renal function is stable. SBP 117.   3. Sacral decubitus - continue wound care Dispo: PT has recommended HH RN/PT. Will d/c home when medically stable.    LOS: 10 days    Brittainy M. Ladoris Gene 07/07/2014 7:56 AM  EP Attending  Patient seen and examined. I have minimally revised the above note and it reflects my findings and impression. Will continue to progress activity. He will need rehab. I think he will need a bit more digoxin. Long term prognosis poor but dependent on his healing of sacral decubitus.  Mikle Bosworth.D.

## 2014-07-07 NOTE — Progress Notes (Signed)
Found pt. To be on 3L, pt. sats 99%. Pt. Placed on 2L. RN aware.

## 2014-07-07 NOTE — Progress Notes (Signed)
Medicare Important Message given? YES  (If response is "NO", the following Medicare IM given date fields will be blank)  Date Medicare IM given: 07/07/14 Medicare IM given by:  Alajah Witman  

## 2014-07-07 NOTE — Progress Notes (Signed)
Physical Therapy Treatment Patient Details Name: [name] MRN: [id number] DOB: [birth year] Today's Date: 07/07/2014    History of Present Illness [name] is a 79 y.o. male with a history of CAD, Ischemic Cardiomyopathy, Systolic CHF, COPD, Chronic Respiratory Failure , HTN , Hyperlipidemia, DM2, Remote history of Colon Cancer who presents to the ED with complaints of SOB and Chest pain . Shortness of breath was transient and improved after being placed on BiPAP in the ER. BP was 101 systolic HR 751-025. He was found to be anemic. On 12/30 patient moved to ICU secondary to Medical issues including c/o of chest pain, short of breath, Pt HR sustaining 150s-170s and elevated troponin.     PT Comments    Progressing towards physical therapy goals, although limited by fatigue and elevated HR of 140 today while ambulating up to 50 feet. Tolerated therapeutic exercises well. Remains adamant that he will return home at d/c, as he has a wife that plans to care for him as needed. Pt was min assist for transfers today. Patient will continue to benefit from skilled physical therapy services to further improve independence with functional mobility.   Follow Up Recommendations  Home health PT [facility] aide, used [name], requesting [name], [name] (PT))     Equipment Recommendations  None recommended by PT    Recommendations for Other Services       Precautions / Restrictions Precautions Precautions: Fall Precaution Comments: pt is generally weak and deconditioned.  Reports his Lt leg "gives out" Restrictions Weight Bearing Restrictions: No    Mobility  Bed Mobility Overal bed mobility: Modified Independent Bed Mobility: Supine to Sit;Sit to Supine     Supine to sit: Modified independent (Device/Increase time) Sit to supine: Modified independent (Device/Increase time)   General bed mobility comments: No assist required  Transfers Overall transfer level: Needs  assistance Equipment used: Rolling walker (2 wheeled) Transfers: Sit to/from Stand Sit to Stand: Min assist;From elevated surface         General transfer comment: Min assist for boost to stand form elevated bed surface. VC for hand placement and to shift weight anteriorly.  Ambulation/Gait Ambulation/Gait assistance: Min guard Ambulation Distance (Feet): 50 Feet Assistive device: Rolling walker (2 wheeled) Gait Pattern/deviations: Step-through pattern;Decreased stride length;Trunk flexed;Shuffle Gait velocity: decreased   General Gait Details: VC for upright posture frequently. Reported his Lt leg fatigues quickly and was worried it would buckle, but this did not occur during today's therapy session. Limited by fatigue; HR elevated to 140s with monitor indicating V-tach. Pt rushed to sit back on bed, unsafely tangling himself with oxygen cord despite max cues and attempts to prevent this.   Stairs            Wheelchair Mobility    Modified Rankin (Stroke Patients Only)       Balance                                    Cognition Arousal/Alertness: Awake/alert Behavior During Therapy: WFL for tasks assessed/performed Overall Cognitive Status: Within Functional Limits for tasks assessed                      Exercises General Exercises - Lower Extremity Ankle Circles/Pumps: AROM;Both;20 reps;Supine Heel Slides: Both;10 reps;Supine;Strengthening Hip ABduction/ADduction: Both;10 reps;Supine;Strengthening Straight Leg Raises: AAROM;Both;10 reps;Strengthening;Supine    General Comments General comments (skin integrity, edema, etc.): SpO2 100% on  3L supplemental O2. HR 80s at rest up to 140s while ambulating.      Pertinent Vitals/Pain Pain Assessment: 0-10 Pain Score: 7  Pain Location: sacrum Pain Intervention(s): Monitored during session;Repositioned  SpO2 98-100% on 3L supplemental o2 HR 80s-140s    Home Living                       Prior Function            PT Goals (current goals can now be found in the care plan section) Acute Rehab PT Goals PT Goal Formulation: With patient/family Time For Goal Achievement: 07/16/14 Potential to Achieve Goals: Good Progress towards PT goals: Progressing toward goals    Frequency  Min 3X/week    PT Plan Current plan remains appropriate    Co-evaluation             End of Session Equipment Utilized During Treatment: Gait belt;Oxygen Activity Tolerance: Patient limited by fatigue;Treatment limited secondary to medical complications (Comment) (HR to 140s, monitor stating v-tach) Patient left: with call bell/phone within reach;with family/visitor present;in bed     Time: 7915-0569 PT Time Calculation (min) (ACUTE ONLY): 34 min  Charges:  $Gait Training: 8-22 mins $Therapeutic Exercise: 8-22 mins                    G Codes:      Ellouise Newer Jul 10, 2014, 10:43 AM Elayne Snare, [place]

## 2014-07-07 NOTE — Evaluation (Signed)
Occupational Therapy Evaluation Patient Details Name: [name] MRN: [id number] DOB: 08/24/1935 Today's Date: 07/07/2014    History of Present Illness [name] is a 79 y.o. male with a history of CAD, Ischemic Cardiomyopathy, Systolic CHF, COPD, Chronic Respiratory Failure , HTN , Hyperlipidemia, DM2, Remote history of Colon Cancer who presents to the ED with complaints of SOB and Chest pain . Shortness of breath was transient and improved after being placed on BiPAP in the ER. BP was 413 systolic HR 244-010. He was found to be anemic. On 12/30 patient moved to ICU secondary to Medical issues including c/o of chest pain, short of breath, Pt HR sustaining 150s-170s and elevated troponin. Diagnosed wtih COPD exacerbation; A-Fib.  Pt has sacral decubitus and has had multiple admissions since 11/15.    Clinical Impression   Pt admitted with above. He demonstrates the below listed deficits and will benefit from continued OT to maximize safety and independence with BADLs.   Pt presents to OT with generalized weakness and deconditioning as well as decreased balance.  Pt. Currently requires max A overall with BADLs.  He fatigues quickly.  Pt. Has had a progressive decline in function over the past 1+ month (he and wife report he was independent in November), with multiple admissions.  They are adamant about discharging home, and wife reports she will hire assist if needed.   He may not be able to tolerate the intensity of CIR, but I think it would be worth investigating as he is requiring a significant amoutn of assist with ADLs and is overall very deconditioned.       Follow Up Recommendations  CIR;Supervision/Assistance - 24 hour    Equipment Recommendations  None recommended by OT    Recommendations for Other Services Rehab consult     Precautions / Restrictions Precautions Precautions: Fall Precaution Comments: pt is generally weak and deconditioned.  Reports his Lt leg "gives  out"      Mobility Bed Mobility Overal bed mobility: Needs Assistance Bed Mobility: Supine to Sit;Sit to Supine   Sidelying to sit: Supervision Supine to sit: Min assist     General bed mobility comments: Min A to scoot hips back and to scoot up in bed   Transfers Overall transfer level: Needs assistance Equipment used: Rolling walker (2 wheeled) Transfers: Sit to/from Stand Sit to Stand: Min assist         General transfer comment: Pt requires min A to power up.  He fatigued quickly.     Balance Overall balance assessment: Needs assistance Sitting-balance support: Feet supported Sitting balance-Leahy Scale: Good     Standing balance support: Bilateral upper extremity supported Standing balance-Leahy Scale: Poor                              ADL Overall ADL's : Needs assistance/impaired Eating/Feeding: Set up;Bed level   Grooming: Wash/dry hands;Wash/dry face;Oral care;Brushing hair;Bed level;Supervision/safety   Upper Body Bathing: Moderate assistance;Sitting;Bed level   Lower Body Bathing: Total assistance;Sit to/from stand   Upper Body Dressing : Moderate assistance;Sitting   Lower Body Dressing: Total assistance;Sit to/from stand   Toilet Transfer: Minimal assistance;Stand-pivot;Comfort height toilet   Toileting- Clothing Manipulation and Hygiene: Maximal assistance;Sit to/from stand       Functional mobility during ADLs: Minimal assistance General ADL Comments: Pt too fatigued to complete ADLs this date.  Pt very distracted by needing to have bowel program performed.  Vision                     Perception     Praxis      Pertinent Vitals/Pain Pain Assessment: Faces Faces Pain Scale: Hurts even more Pain Location: sacrum and abdomen (wating on RN to perform digi stim  Pain Descriptors / Indicators: Aching Pain Intervention(s): Repositioned;Monitored during session     Hand Dominance     Extremity/Trunk Assessment  Upper Extremity Assessment Upper Extremity Assessment: Generalized weakness   Lower Extremity Assessment Lower Extremity Assessment: Defer to PT evaluation   Cervical / Trunk Assessment Cervical / Trunk Assessment: Kyphotic   Communication Communication Communication: No difficulties   Cognition Arousal/Alertness: Awake/alert Behavior During Therapy: Anxious Overall Cognitive Status: Within Functional Limits for tasks assessed                     General Comments       Exercises       Shoulder Instructions      Home Living Family/patient expects to be discharged to:: Private residence Living Arrangements: Spouse/significant other Available Help at Discharge: Family;Available 24 hours/day Type of Home: House Home Access: Stairs to enter [employer] of Steps: 3-5 depending on which door he goes in Entrance Stairs-Rails: Right;Left;Can reach both Home Layout: Two level;Able to live on main level with bedroom/bathroom   Alternate Level Stairs-Rails: Can reach both Bathroom Shower/Tub: Walk-in shower;Door   [employer] Toilet: Standard Bathroom Accessibility: Yes How Accessible: Accessible via walker Home Equipment: Walker - 2 wheels;Cane - single point;Wheelchair - manual;Shower seat;Bedside commode;Grab bars - tub/shower;Hand held shower head;Hospital bed (hospital bed with air mattress overlay )   Additional Comments: Wife reports she periodically hires assist when needed at home.  Pt was at [facility] briefly.  Both pt and wife are adamant about home discharge.       Prior Functioning/Environment Level of Independence: Independent with assistive device(s);Needs assistance    ADL's / Homemaking Assistance Needed: Pt's wife reports he has a HHaide that has been assisting him with BADLs and she has been assisting him when the aid is not there.  Pt has to perform digi stim due to "nerve injury" after a back surgery.  He reports he typically performs this  on his own         OT Diagnosis: Generalized weakness;Acute pain   OT Problem List: Decreased strength;Decreased activity tolerance;Impaired balance (sitting and/or standing);Decreased safety awareness;Decreased knowledge of use of DME or AE;Cardiopulmonary status limiting activity;Pain   OT Treatment/Interventions: Self-care/ADL training;DME and/or AE instruction;Therapeutic activities;Patient/family education;Balance training;Energy conservation    OT Goals(Current goals can be found in the care plan section) Acute Rehab OT Goals Patient Stated Goal: to get stronger  OT Goal Formulation: With patient/family Time For Goal Achievement: 07/21/14 Potential to Achieve Goals: Good ADL Goals Pt Will Perform Grooming: with min guard assist;standing Pt Will Perform Upper Body Bathing: with set-up;with supervision;sitting Pt Will Perform Lower Body Bathing: with min assist;sit to/from stand Pt Will Perform Upper Body Dressing: with set-up;with supervision;sitting Pt Will Perform Lower Body Dressing: with min assist;sit to/from stand Pt Will Transfer to Toilet: with min guard assist;ambulating;regular height toilet;bedside commode;grab bars Pt Will Perform Toileting - Clothing Manipulation and hygiene: with min guard assist;sit to/from stand Additional ADL Goal #1: Pt will be able to actively participate in 25 mins therapeutic activity with no more than 2 rests.   OT Frequency: Min 2X/week   Barriers to D/C:  Co-evaluation              End of Session Equipment Utilized During Treatment: Rolling walker;Oxygen Nurse Communication: Mobility status  Activity Tolerance: Patient limited by fatigue Patient left: in bed;with call bell/phone within reach;with family/visitor present   Time: 1517-6160 OT Time Calculation (min): 29 min Charges:  OT General Charges $OT Visit: 1 Procedure OT Evaluation $Initial OT Evaluation Tier I: 1 Procedure OT Treatments $Therapeutic  Activity: 23-37 mins G-Codes:    Anjelina Dung M 07-18-14, 2:16 PM

## 2014-07-08 LAB — CBC
HEMATOCRIT: 26.7 % — AB (ref 39.0–52.0)
Hemoglobin: 8.4 g/dL — ABNORMAL LOW (ref 13.0–17.0)
MCH: 30.4 pg (ref 26.0–34.0)
MCHC: 31.5 g/dL (ref 30.0–36.0)
MCV: 96.7 fL (ref 78.0–100.0)
PLATELETS: 228 10*3/uL (ref 150–400)
RBC: 2.76 MIL/uL — AB (ref 4.22–5.81)
RDW: 16 % — ABNORMAL HIGH (ref 11.5–15.5)
WBC: 9.9 10*3/uL (ref 4.0–10.5)

## 2014-07-08 LAB — BASIC METABOLIC PANEL
Anion gap: 7 (ref 5–15)
BUN: 24 mg/dL — ABNORMAL HIGH (ref 6–23)
CHLORIDE: 96 meq/L (ref 96–112)
CO2: 33 mmol/L — ABNORMAL HIGH (ref 19–32)
Calcium: 8.3 mg/dL — ABNORMAL LOW (ref 8.4–10.5)
Creatinine, Ser: 0.99 mg/dL (ref 0.50–1.35)
GFR calc Af Amer: 90 mL/min — ABNORMAL LOW (ref >90)
GFR calc non Af Amer: 78 mL/min — ABNORMAL LOW (ref >90)
Glucose, Bld: 148 mg/dL — ABNORMAL HIGH (ref 70–99)
POTASSIUM: 4.3 mmol/L (ref 3.5–5.1)
SODIUM: 136 mmol/L (ref 135–145)

## 2014-07-08 LAB — GLUCOSE, CAPILLARY
GLUCOSE-CAPILLARY: 94 mg/dL (ref 70–99)
GLUCOSE-CAPILLARY: 194 mg/dL — AB (ref 70–99)
GLUCOSE-CAPILLARY: 216 mg/dL — AB (ref 70–99)
Glucose-Capillary: 124 mg/dL — ABNORMAL HIGH (ref 70–99)

## 2014-07-08 LAB — PREALBUMIN: Prealbumin: 10.2 mg/dL — ABNORMAL LOW (ref 17.0–34.0)

## 2014-07-08 MED ORDER — VITAMIN A 8000 UNITS PO CAPS
8000.0000 [IU] | ORAL_CAPSULE | Freq: Every day | ORAL | Status: DC
  Administered 2014-07-08 – 2014-07-17 (×9): 8000 [IU] via ORAL
  Filled 2014-07-08 (×10): qty 1

## 2014-07-08 MED ORDER — VITAMIN C 500 MG PO TABS
500.0000 mg | ORAL_TABLET | Freq: Every day | ORAL | Status: DC
  Administered 2014-07-08 – 2014-07-17 (×9): 500 mg via ORAL
  Filled 2014-07-08 (×10): qty 1

## 2014-07-08 MED ORDER — DAKINS (1/4 STRENGTH) 0.125 % EX SOLN
Freq: Two times a day (BID) | CUTANEOUS | Status: AC
  Administered 2014-07-08 – 2014-07-10 (×6)
  Filled 2014-07-08: qty 473

## 2014-07-08 MED ORDER — VITAMIN A 50000 UNIT/ML IM SOLN
10000.0000 [IU] | Freq: Every day | INTRAMUSCULAR | Status: DC
  Filled 2014-07-08: qty 2

## 2014-07-08 MED ORDER — METOPROLOL TARTRATE 50 MG PO TABS
75.0000 mg | ORAL_TABLET | Freq: Two times a day (BID) | ORAL | Status: DC
  Filled 2014-07-08 (×2): qty 1

## 2014-07-08 MED ORDER — ADULT MULTIVITAMIN W/MINERALS CH
1.0000 | ORAL_TABLET | Freq: Every day | ORAL | Status: DC
  Administered 2014-07-08 – 2014-07-17 (×9): 1 via ORAL
  Filled 2014-07-08 (×10): qty 1

## 2014-07-08 MED ORDER — ZINC SULFATE 220 (50 ZN) MG PO CAPS
220.0000 mg | ORAL_CAPSULE | Freq: Every day | ORAL | Status: DC
  Administered 2014-07-08 – 2014-07-17 (×9): 220 mg via ORAL
  Filled 2014-07-08 (×10): qty 1

## 2014-07-08 NOTE — Progress Notes (Signed)
Patient ID: SHIQUAN MATHIEU, male   DOB: Aug 16, 1935, 79 y.o.   MRN: [id number]    Patient Name: [name] Date of Encounter: 07/08/2014     Principal Problem:   Symptomatic anemia Active Problems:   Coronary atherosclerosis   Cardiomyopathy, ischemic- EF 20-25% 2D 08/08/13   Persistent atrial fibrillation   COPD (chronic obstructive pulmonary disease) with emphysema   Biventricular automatic implantable cardioverter defibrillator in situ - MDT   Acute on chronic systolic heart failure, NYHA class 3   Decubitus ulcer of coccygeal region-excision and closure Nov 2015   Hyperkalemia   SOB (shortness of breath)   Sinus tachycardia   Hypotension   AKI (acute kidney injury)   Elevated troponin    SUBJECTIVE Remains debilitated and chronically ill. No sob. Weak. Given pain meds and somnolent.  CURRENT MEDS . budesonide  0.5 mg Nebulization BID  . digoxin  0.125 mg Oral Daily  . feeding supplement (ENSURE)  1 Container Oral TID BM  . furosemide  60 mg Oral BID  . guaiFENesin  600 mg Oral BID  . insulin aspart  0-15 Units Subcutaneous TID WC  . insulin aspart  0-5 Units Subcutaneous QHS  . metoprolol tartrate  50 mg Oral BID  . multivitamin with minerals  1 tablet Oral Daily  . pantoprazole  40 mg Oral BID  . predniSONE  10 mg Oral Q breakfast  . sodium hypochlorite   Irrigation BID  . tiotropium  18 mcg Inhalation Daily  . vitamin A  10,000 Units Intramuscular Daily  . vitamin C  500 mg Oral Daily  . zinc sulfate  220 mg Oral Daily    OBJECTIVE  Filed Vitals:   07/07/14 2047 07/07/14 2122 07/08/14 0500 07/08/14 1027  BP:  112/58 111/62 95/65  Pulse:  95 97 152  Temp:  98.3 F (36.8 C) 98.1 F (36.7 C)   TempSrc:  Oral Oral   Resp:  19 18   Height:      Weight:   143 lb 11.8 oz (65.2 kg)   SpO2: 99% 100% 99% 100%    Intake/Output Summary (Last 24 hours) at 07/08/14 1056 Last data filed at 07/08/14 0543  Gross per 24 hour  Intake    480 ml  Output    900 ml    Net   -420 ml   Filed Weights   07/06/14 0443 07/07/14 0545 07/08/14 0500  Weight: 148 lb 9.6 oz (67.405 kg) 145 lb 1.6 oz (65.817 kg) 143 lb 11.8 oz (65.2 kg)    PHYSICAL EXAM  General: Pleasant, NAD. Neuro: Alert and oriented X 3. Moves all extremities spontaneously. Psych: Normal affect. HEENT:  Normal  Neck: Supple without bruits or JVD. Lungs:  Resp regular and unlabored, CTA. Heart: IRRR no s3, s4, or murmurs. Abdomen: Soft, non-tender, non-distended, BS + x 4.  Extremities: No clubbing, cyanosis or edema. DP/PT/Radials 2+ and equal bilaterally.  Accessory Clinical Findings  CBC  Recent Labs  07/08/14 0432  WBC 9.9  HGB 8.4*  HCT 26.7*  MCV 96.7  PLT 825   Basic Metabolic Panel  Recent Labs  07/07/14 0534 07/08/14 0432  NA 137 136  K 4.6 4.3  CL 96 96  CO2 35* 33*  GLUCOSE 141* 148*  BUN 23 24*  CREATININE 1.09 0.99  CALCIUM 8.2* 8.3*   Liver Function Tests No results for input(s): AST, ALT, ALKPHOS, BILITOT, PROT, ALBUMIN in the last 72 hours. No results for input(s): LIPASE,  AMYLASE in the last 72 hours. Cardiac Enzymes No results for input(s): CKTOTAL, CKMB, CKMBINDEX, TROPONINI in the last 72 hours. BNP Invalid input(s): POCBNP D-Dimer No results for input(s): DDIMER in the last 72 hours. Hemoglobin A1C No results for input(s): HGBA1C in the last 72 hours. Fasting Lipid Panel No results for input(s): CHOL, HDL, LDLCALC, TRIG, CHOLHDL, LDLDIRECT in the last 72 hours. Thyroid Function Tests No results for input(s): TSH, T4TOTAL, T3FREE, THYROIDAB in the last 72 hours.  Invalid input(s): FREET3  TELE  Atrial fib with a controlled VR  Radiology/Studies  Dg Chest Port 1 View  07/01/2014   CLINICAL DATA:  Congestive heart failure. Chest pain. Atrial fibrillation.  EXAM: PORTABLE CHEST - 1 VIEW  COMPARISON:  06/27/2014  FINDINGS: Sequelae of prior CABG are again identified. ICD remains in place. Cardiac silhouette remains enlarged. There  is increased retrocardiac opacity in the left lung base with partial obscuration of the left hemidiaphragm. A small left pleural effusion is questioned. The right lung remains grossly clear. No pneumothorax is identified. Right upper quadrant abdominal surgical clips are noted.  IMPRESSION: Left basilar atelectasis versus infiltrate. Question small left pleural effusion.   Electronically Signed   By: Logan Bores   On: 07/01/2014 10:49   Dg Chest Port 1 View  06/28/2014   CLINICAL DATA:  Constant short of breath. Chest pain. Leg swelling.  EXAM: PORTABLE CHEST - 1 VIEW  COMPARISON:  Radiograph 06/03/2014  FINDINGS: Left-sided pacemaker overlies stable enlarged cardiac silhouette. No effusion, infiltrate, pneumothorax.  IMPRESSION: Cardiomegaly without acute findings .   Electronically Signed   By: Suzy Bouchard M.D.   On: 06/28/2014 00:00    ASSESSMENT AND PLAN  1. Atrial fib with a controlled VR 2. Endstage cardiomyopathy 3. Acute on chronic systolic heart failure 4. Sacral decubitus Rec: Will uptitrate his metoprolol and continue digoxin. I appreciate help from our wound care service. Unfortunately he is not a candidate for general anesthesia. Will treat decubitus conservatively. His rate is still a little fast and at this point main question once rate controlled is where he can go. Either hospice/home care or SNF.  Morgaine Kimball,M.D.  07/08/2014 10:56 AM

## 2014-07-08 NOTE — Consult Note (Signed)
Contacted by bedside nursing 07/07/14 for new onset odor with the dressing change for the sacral wound.  [facility] team has consulted on this patient a couple of times for this wound, however primary care provider is [facility]-Dr. Hassell Done.  I agreed to re-evaluate wound this am.  Upon my arrival for my assessment plastic surgery team at the bedside to evaluate. They will address any new concerns for this wound and Dr. Migdalia Dk to communicate with Dr. Hassell Done related to patients sacral wound.   WOC will not re-consult for this reason.    Re consult if needed, will not follow at this time. Thanks  Lacrystal Barbe [place], [employer] [phone])

## 2014-07-08 NOTE — Progress Notes (Signed)
Occupational Therapy Treatment Patient Details Name: [name] MRN: [id number] DOB: 05-15-1936 Today's Date: 07/08/2014    History of present illness [name] is a 79 y.o. male with a history of CAD, Ischemic Cardiomyopathy, Systolic CHF, COPD, Chronic Respiratory Failure , HTN , Hyperlipidemia, DM2, Remote history of Colon Cancer who presents to the ED with complaints of SOB and Chest pain . Shortness of breath was transient and improved after being placed on BiPAP in the ER. BP was 694 systolic HR 503-888. He was found to be anemic. On 12/30 patient moved to ICU secondary to Medical issues including c/o of chest pain, short of breath, Pt HR sustaining 150s-170s and elevated troponin. Diagnosed wtih COPD exacerbation; A-Fib.  Pt has sacral decubitus and has had multiple admissions since 11/15.    OT comments  Pt with improved ability to participate.  He required ~6 rest breaks in 60 mins.   As pt fatigues he requires mod A to move sit to stand, and wife does report that pt has gotten stuck on the toilet at home.  Given this, he may need a hoyer lift at home in case he does fatigue and has difficulty with sit to stand - do not feel wife could safely provide him mod A in this situation - will monitor need for lift.   HR 112 with activity with 02 sats 97% on 2L supplemental 02.   Still feel best option for this pt is either SNF or CIR.  CIR, currently is saying he is not a candidate, and pt and wife will not consider SNF.  Recommend HHOT in addition to the PT and HHaide at discharge.   Follow Up Recommendations  Home health OT;Supervision/Assistance - 24 hour [facility])    Equipment Recommendations       Recommendations for Other Services      Precautions / Restrictions Precautions Precautions: Fall Precaution Comments: pt is generally weak and deconditioned.  Reports his Lt leg "gives out"       Mobility Bed Mobility Overal bed mobility: Needs Assistance Bed Mobility: Supine  to Sit;Sit to Supine   Sidelying to sit: Supervision   Sit to supine: Supervision   General bed mobility comments: Pt with heavy reliance on rails, and min A to scoot up in the bed   Transfers Overall transfer level: Needs assistance Equipment used: Rolling walker (2 wheeled) Transfers: Sit to/from [facility] Sit to Stand: Min assist;Mod assist Stand pivot transfers: Min assist       General transfer comment: Pt requires elevated bed height to move sit to stand.  As he fatigues requires mod A .  Upon speaking to wife, pt has a lift chair at home, but has had difficulty moving sit to stand from toilet on elevated height commode.    Balance Overall balance assessment: Needs assistance Sitting-balance support: No upper extremity supported Sitting balance-Leahy Scale: Good     Standing balance support: Bilateral upper extremity supported Standing balance-Leahy Scale: Poor                     ADL       Grooming: Oral care;Brushing hair;Supervision/safety;Sitting (EOB )                   Toilet Transfer: Minimal assistance;Ambulation;Comfort height toilet;Grab bars;RW Armed forces technical officer Details (indicate cue type and reason): Pt and wife report pt has had difficulty movint sit to stand from elevated height toilet.  Discussed 3in1 use, but  standard 3in1 does not provide adequate space for pt to perform digi stim.  Discussed options and advised them to consider looking at DME stores for a 3in1 with an elongated seat  Toileting- Clothing Manipulation and Hygiene: Moderate assistance;Sit to/from stand (excluding digit stim (total A at this time ))       Functional mobility during ADLs: Moderate assistance;Minimal assistance (mod A for sit to stand ) General ADL Comments: Pt with improved activity tolerance this pm, but requires mod A to move sit to stand as he fatigues.  Pt requested to ambulate prior to performing grooming standing at sink.  Upon return  to room, pt too fatigued to attempt grooming in standing and therefore was performed with pt seated EOB.       Vision                     Perception     Praxis      Cognition   Behavior During Therapy: Anxious;WFL for tasks assessed/performed Overall Cognitive Status: Within Functional Limits for tasks assessed                       Extremity/Trunk Assessment               Exercises General Exercises - Upper Extremity Shoulder Flexion: AROM;Right;Left;10 reps;Seated (3 sets)   Shoulder Instructions       General Comments      Pertinent Vitals/ Pain       Pain Assessment: Faces Faces Pain Scale: Hurts even more Pain Location: sacrum and low back  Pain Descriptors / Indicators: Aching;Grimacing;Guarding Pain Intervention(s): Limited activity within patient's tolerance;Monitored during session;Repositioned  Home Living                                          Prior Functioning/Environment              Frequency Min 2X/week     Progress Toward Goals  OT Goals(current goals can now be found in the care plan section)  Progress towards OT goals: Progressing toward goals  ADL Goals Pt Will Perform Grooming: with min guard assist;standing Pt Will Perform Upper Body Bathing: with set-up;with supervision;sitting Pt Will Perform Lower Body Bathing: with min assist;sit to/from stand Pt Will Perform Upper Body Dressing: with set-up;with supervision;sitting Pt Will Perform Lower Body Dressing: with min assist;sit to/from stand Pt Will Transfer to Toilet: with min guard assist;ambulating;regular height toilet;bedside commode;grab bars Pt Will Perform Toileting - Clothing Manipulation and hygiene: with min guard assist;sit to/from stand Additional ADL Goal #1: Pt will be able to actively participate in 25 mins therapeutic activity with no more than 2 rests.   Plan Discharge plan needs to be updated    Co-evaluation                  End of Session Equipment Utilized During Treatment: Rolling walker;Oxygen (but requires multiple rest breaks )   Activity Tolerance Patient tolerated treatment well   Patient Left in bed;with call bell/phone within reach;with nursing/sitter in room   Nurse Communication Mobility status        Time: 2440-1027 OT Time Calculation (min): 66 min  Charges: OT General Charges $OT Visit: 1 Procedure OT Treatments $Self Care/Home Management : 8-22 mins $Therapeutic Activity: 38-52 mins  Debborah Alonge M 07/08/2014, 3:22 PM

## 2014-07-08 NOTE — Consult Note (Signed)
Reason for Consult: sacral ulcer Referring Physician: Hospitalist  [name] is an 79 y.o. male.  HPI: The patient is a 79 yrs old male here for multiple medical problems.  He developed a sacral ulcer over a year ago and has been treated by Dr. Hassell Done with debridement and closure.  He subsequently opened up.  The are is ~ 5 x  5 cm and to bone.  There is a malodor and nonviable tissue.  The patient does not want to undergo surgery but was willing to let me talk with Dr. Lovena Le about his anesthetic risk for surgical debridement.  Past Medical History  Diagnosis Date  . PVD (peripheral vascular disease)   . HTN (hypertension)   . CHF (congestive heart failure)   . Ventricular tachycardia   . Ischemic heart disease, chronic   . Dyslipidemia   . CAD (coronary artery disease)     a. s/p CABG, last cath 2012 -severe ischemic cardiomyopathy with EF< 15%, patent vein grafts  . Self-catheterizes urinary bladder     since back surgery.  6- 7 times a day. (05/27/2014)  . Subdural hematoma 2013    a. anticoagulation discontinued at that time  . Neurogenic bladder     since back surgery - self caths  . Neurogenic bowel     digitally stimulates daily.  Marland Kitchen COPD (chronic obstructive pulmonary disease)     a. on home O2  . Emphysema   . Anemia     SERUM MONOCLONAL IgM KAPPA PROTEIN - NO CLINICAL EVIDENCE FOR PROGRESSION TO MULTIPLE MYELOMA OR OTHER PROLIFERATIVE DISORDER - PER OFFICE NOTE DR. SHERRILL DATED 12/30/13  . Pneumonia X 2  . Borderline type 2 diabetes mellitus   . TIA (transient ischemic attack) 1980's  . Colon cancer   . MRSA infection   . Permanent atrial fibrillation     Past Surgical History  Procedure Laterality Date  . Lumbar disc surgery  X 2  . Iliac artery stent Left   . Bi-ventricular implantable cardioverter defibrillator  (crt-d)  04-10-08    implantation of Medtronic Concerto Bi-V ICD [; Cristopher Peru MD  . Colonoscopy    . Polypectomy    . Insertion of iliac  stent Right 06/04/2012    external   . Cardiac catheterization  2012    patent vein grafts, EF <15%  . Bi-ventricular implantable cardioverter defibrillator  (crt-d)  2009; 2014    MDT CRTD upgraded from CRTP by Dr Mitzi Davenport; gen change 2014 by Dr Lovena Le  . Endarterectomy femoral  06/28/2012    Procedure: ENDARTERECTOMY FEMORAL;  Surgeon: Serafina Mitchell, MD;  Location: [facility];  Service: Vascular;  Laterality: Left;  . Patch angioplasty  06/28/2012    Procedure: PATCH ANGIOPLASTY;  Surgeon: Serafina Mitchell, MD;  Location: [facility] OR;  Service: Vascular;  Laterality: Left;  Using 0.8 cm x 15.2 cm Hemashield Patch.  . Colon surgery  03/2004  . Cataract extraction w/ intraocular lens  implant, bilateral Bilateral   . Cholecystectomy    . Coronary artery bypass graft  1998    x3 vessels  . Femoral-popliteal bypass graft Right 03/28/2013    Procedure: BYPASS GRAFT FEMORAL-BELOW KNEE POPLITEAL ARTERY Using 75m x 80 cm Propaten Graft ,With Right Common Femoral Artery Endarterectomy.;  Surgeon: VSerafina Mitchell MD;  Location: [facility] OR;  Service: Vascular;  Laterality: Right;  . Debridment of decubitus ulcer N/A 05/18/2014    Procedure: EXCISION AND CLOSURE OF DECUBITUS ULCER 5CM;  Surgeon:  Valarie Merino, MD;  Location: [facility] ORS;  Service: General;  Laterality: N/A;  . Posterior lumbar fusion  X 1  . Abdominal aortagram N/A 02/21/2012    Procedure: ABDOMINAL Ronny Flurry;  Surgeon: Nada Libman, MD;  Location: [facility] CATH LAB;  Service: Cardiovascular;  Laterality: N/A;  . Abdominal aortagram N/A 06/04/2012    Procedure: ABDOMINAL AORTAGRAM;  Surgeon: Nada Libman, MD;  Location: [facility] CATH LAB;  Service: Cardiovascular;  Laterality: N/A;  . Biv icd genertaor change out N/A 10/22/2012    MDT CRTD generator change by Dr [name]  . Abdominal aortagram N/A 03/18/2013    Procedure: ABDOMINAL Ronny Flurry;  Surgeon: Nada Libman, MD;  Location: [facility] CATH LAB;  Service: Cardiovascular;  Laterality: N/A;  . Abdominal aortagram  N/A 07/02/2013    Procedure: ABDOMINAL AORTAGRAM;  Surgeon: Nada Libman, MD;  Location: [facility] CATH LAB;  Service: Cardiovascular;  Laterality: N/A;  . Lower extremity angiogram Bilateral 07/02/2013    Procedure: LOWER EXTREMITY ANGIOGRAM;  Surgeon: Nada Libman, MD;  Location: [facility] CATH LAB;  Service: Cardiovascular;  Laterality: Bilateral;  . Percutaneous stent intervention Right 07/02/2013    Procedure: PERCUTANEOUS STENT INTERVENTION;  Surgeon: Nada Libman, MD;  Location: [facility] CATH LAB;  Service: Cardiovascular;  Laterality: Right;  rt ext iliac stent  . Esophagogastroduodenoscopy (egd) with propofol N/A 06/29/2014    Procedure: ESOPHAGOGASTRODUODENOSCOPY (EGD) WITH PROPOFOL;  Surgeon: Vertell Novak., MD;  Location: [facility] ENDOSCOPY;  Service: Endoscopy;  Laterality: N/A;    Family History  Problem Relation Age of Onset  . Heart failure Father   . Diabetes Father   . Heart disease Father   . Hyperlipidemia Father   . Hypertension Father   . Heart disease Mother     before age 36  . Hyperlipidemia Mother   . Hypertension Mother     Social History:  reports that he quit smoking about 12 years ago. His smoking use included Cigarettes. He has a 144 pack-year smoking history. He has never used smokeless tobacco. He reports that he drinks alcohol. He reports that he does not use illicit drugs.  Allergies: No Known Allergies  Medications: I have reviewed the patient's current medications.  Results for orders placed or performed during the hospital encounter of 06/27/14 (from the past 48 hour(s))  Glucose, capillary     Status: Abnormal   Collection Time: 07/06/14 11:57 AM  Result Value Ref Range   Glucose-Capillary 215 (H) 70 - 99 mg/dL  Glucose, capillary     Status: Abnormal   Collection Time: 07/06/14  4:22 PM  Result Value Ref Range   Glucose-Capillary 192 (H) 70 - 99 mg/dL  Glucose, capillary     Status: Abnormal   Collection Time: 07/06/14  9:50 PM  Result Value Ref  Range   Glucose-Capillary 165 (H) 70 - 99 mg/dL   Comment 1 Documented in Chart    Comment 2 Notify RN   Basic metabolic panel     Status: Abnormal   Collection Time: 07/07/14  5:34 AM  Result Value Ref Range   Sodium 137 135 - 145 mmol/L    Comment: Please note change in reference range.   Potassium 4.6 3.5 - 5.1 mmol/L    Comment: Please note change in reference range.   Chloride 96 96 - 112 mEq/L   CO2 35 (H) 19 - 32 mmol/L   Glucose, Bld 141 (H) 70 - 99 mg/dL   BUN 23 6 - 23 mg/dL  Creatinine, Ser 1.09 0.50 - 1.35 mg/dL   Calcium 8.2 (L) 8.4 - 10.5 mg/dL   GFR calc non Af Amer 64 (L) >90 mL/min   GFR calc Af Amer 74 (L) >90 mL/min    Comment: (NOTE) The eGFR has been calculated using the CKD EPI equation. This calculation has not been validated in all clinical situations. eGFR's persistently <90 mL/min signify possible Chronic Kidney Disease.    Anion gap 6 5 - 15  Glucose, capillary     Status: Abnormal   Collection Time: 07/07/14  6:04 AM  Result Value Ref Range   Glucose-Capillary 118 (H) 70 - 99 mg/dL  Glucose, capillary     Status: Abnormal   Collection Time: 07/07/14 11:40 AM  Result Value Ref Range   Glucose-Capillary 196 (H) 70 - 99 mg/dL   Comment 1 Documented in Chart    Comment 2 Notify RN   Glucose, capillary     Status: Abnormal   Collection Time: 07/07/14  4:35 PM  Result Value Ref Range   Glucose-Capillary 198 (H) 70 - 99 mg/dL  Glucose, capillary     Status: Abnormal   Collection Time: 07/07/14  9:21 PM  Result Value Ref Range   Glucose-Capillary 173 (H) 70 - 99 mg/dL  CBC     Status: Abnormal   Collection Time: 07/08/14  4:32 AM  Result Value Ref Range   WBC 9.9 4.0 - 10.5 K/uL   RBC 2.76 (L) 4.22 - 5.81 MIL/uL   Hemoglobin 8.4 (L) 13.0 - 17.0 g/dL   HCT 26.7 (L) 39.0 - 52.0 %   MCV 96.7 78.0 - 100.0 fL   MCH 30.4 26.0 - 34.0 pg   MCHC 31.5 30.0 - 36.0 g/dL   RDW 16.0 (H) 11.5 - 15.5 %   Platelets 228 150 - 400 K/uL  Basic metabolic panel      Status: Abnormal   Collection Time: 07/08/14  4:32 AM  Result Value Ref Range   Sodium 136 135 - 145 mmol/L    Comment: Please note change in reference range.   Potassium 4.3 3.5 - 5.1 mmol/L    Comment: Please note change in reference range.   Chloride 96 96 - 112 mEq/L   CO2 33 (H) 19 - 32 mmol/L   Glucose, Bld 148 (H) 70 - 99 mg/dL   BUN 24 (H) 6 - 23 mg/dL   Creatinine, Ser 0.99 0.50 - 1.35 mg/dL   Calcium 8.3 (L) 8.4 - 10.5 mg/dL   GFR calc non Af Amer 78 (L) >90 mL/min   GFR calc Af Amer 90 (L) >90 mL/min    Comment: (NOTE) The eGFR has been calculated using the CKD EPI equation. This calculation has not been validated in all clinical situations. eGFR's persistently <90 mL/min signify possible Chronic Kidney Disease.    Anion gap 7 5 - 15  Glucose, capillary     Status: Abnormal   Collection Time: 07/08/14  6:27 AM  Result Value Ref Range   Glucose-Capillary 124 (H) 70 - 99 mg/dL    No results found.  Review of Systems  Constitutional: Positive for weight loss and malaise/fatigue.  HENT: Negative.   Eyes: Negative.   Respiratory: Positive for shortness of breath.   Cardiovascular: Negative.   Gastrointestinal: Negative.   Genitourinary:       Foley in place  Musculoskeletal: Positive for back pain and joint pain.  Skin:       Sacral ulcer   Blood pressure  111/62, pulse 97, temperature 98.1 F (36.7 C), temperature source Oral, resp. rate 18, height _0  (1.702 m), weight 65.2 kg (143 lb 11.8 oz), SpO2 99 %. Physical Exam  Constitutional: He is oriented to person, place, and time.  HENT:  Head: Normocephalic and atraumatic.  Eyes: Pupils are equal, round, and reactive to light.  Cardiovascular: Normal rate.   Respiratory:  Mild shortness of breath   Musculoskeletal:       Back:  Neurological: He is alert and oriented to person, place, and time.  Psychiatric: He has a normal mood and affect. His behavior is normal.    Assessment/Plan: Recommend PT  for hydrotherapy.  Spoke with Dr. Hassell Done about patient. Paged Dr. Lovena Le about surgical / anesthetic risks.  Recommend continued protein intake, multivitamin, vit C and Zinc daily.  Off loading is very important and dakins wet to dry dressing changes for a week.  [place] 07/08/2014, 10:13 AM

## 2014-07-09 ENCOUNTER — Inpatient Hospital Stay (HOSPITAL_COMMUNITY): Payer: Medicare Other

## 2014-07-09 LAB — GLUCOSE, CAPILLARY
GLUCOSE-CAPILLARY: 72 mg/dL (ref 70–99)
GLUCOSE-CAPILLARY: 197 mg/dL — AB (ref 70–99)
GLUCOSE-CAPILLARY: 176 mg/dL — AB (ref 70–99)
Glucose-Capillary: 137 mg/dL — ABNORMAL HIGH (ref 70–99)
Glucose-Capillary: 160 mg/dL — ABNORMAL HIGH (ref 70–99)

## 2014-07-09 MED ORDER — DIGOXIN 0.25 MG/ML IJ SOLN
0.1250 mg | Freq: Once | INTRAMUSCULAR | Status: AC
  Administered 2014-07-09: 0.125 mg via INTRAVENOUS
  Filled 2014-07-09: qty 0.5

## 2014-07-09 MED ORDER — METOPROLOL TARTRATE 50 MG PO TABS
75.0000 mg | ORAL_TABLET | Freq: Two times a day (BID) | ORAL | Status: DC
  Administered 2014-07-09 – 2014-07-10 (×3): 75 mg via ORAL
  Filled 2014-07-09 (×4): qty 1

## 2014-07-09 MED ORDER — METOPROLOL TARTRATE 1 MG/ML IV SOLN
5.0000 mg | Freq: Once | INTRAVENOUS | Status: AC
  Administered 2014-07-09: 5 mg via INTRAVENOUS
  Filled 2014-07-09: qty 5

## 2014-07-09 MED ORDER — METOPROLOL TARTRATE 1 MG/ML IV SOLN
5.0000 mg | Freq: Four times a day (QID) | INTRAVENOUS | Status: DC | PRN
  Administered 2014-07-10 – 2014-07-16 (×3): 5 mg via INTRAVENOUS
  Filled 2014-07-09 (×6): qty 5

## 2014-07-09 NOTE — Evaluation (Signed)
Occupational Therapy Evaluation Patient Details Name: DEMPSY Bradley MRN: [id number] DOB: 1935/08/05 Today's Date: 07/09/2014    History of Present Illness [name] is a 79 y.o. male with a history of CAD, Ischemic Cardiomyopathy, Systolic CHF, COPD, Chronic Respiratory Failure , HTN , Hyperlipidemia, DM2, Remote history of Colon Cancer who presents to the ED with complaints of SOB and Chest pain . Shortness of breath was transient and improved after being placed on BiPAP in the ER. BP was 035 systolic HR 009-381. He was found to be anemic. On 12/30 patient moved to ICU secondary to Medical issues including c/o of chest pain, short of breath, Pt HR sustaining 150s-170s and elevated troponin.    Clinical Impression   .Pt fatigued this pm, but agreeable to theraband exercise at bed level.  Pt. Instructed in HEP and t-band left in room.  Pt and wife with multiple questions about DME for toilet at home.  Discussed options with them again, and encouraged wife to go to local DME provider to look at options that would best meet his needs.  They both had questions re: [employer], encouraged them to read through brochure and to think about it.      Follow Up Recommendations  Home health OT;Supervision/Assistance - 24 hour    Equipment Recommendations  None recommended by OT    Recommendations for Other Services Rehab consult     Precautions / Restrictions Precautions Precautions: Fall Precaution Comments: pt is generally weak and deconditioned.  Reports his Lt leg "gives out" Restrictions Weight Bearing Restrictions: No      Mobility Bed Mobility Overal bed mobility: Modified Independent                Transfers Overall transfer level: Needs assistance Equipment used: Rolling walker (2 wheeled) Transfers: Sit to/from Stand Sit to Stand: Mod assist         General transfer comment: Mod assist for boost to stand from chair with arm rests. Required two attemtps with cues for  positioning and hand placement. Min assist for boost and balance from highly elevated bed surface, performed x2 today.    Balance                                            ADL                             Toilet Transfer Details (indicate cue type and reason): again discussed options for toilet DME. Encouraged his wife to go to local DME stores to look at options            General ADL Comments: Pt fatigued after PT, but was agreeable to performing Theraband exercise bed level.      Vision                     Perception     Praxis      Pertinent Vitals/Pain Pain Assessment: Faces Pain Score:  ("It just hurts") Faces Pain Scale: Hurts even more Pain Location: back Pain Intervention(s): Monitored during session;Repositioned     Hand Dominance     Extremity/Trunk Assessment             Communication     Cognition Arousal/Alertness: Awake/alert Behavior During Therapy: WFL for tasks assessed/performed Overall Cognitive Status: Within Functional  Limits for tasks assessed (Pt noted to repeat questions multiple times)                     General Comments       Exercises Exercises: General Lower Extremity Other Exercises Other Exercises: Pt performed 1 set 10 reps horizontal abduction with elbow extension and diagonals.  Pt fatigued afterward and requires 2 rest breaks    Shoulder Instructions      Home Living                                          Prior Functioning/Environment               OT Diagnosis:     OT Problem List:     OT Treatment/Interventions:      OT Goals(Current goals can be found in the care plan section) ADL Goals Pt Will Perform Grooming: with min guard assist;standing Pt Will Perform Upper Body Bathing: with set-up;with supervision;sitting Pt Will Perform Lower Body Bathing: with min assist;sit to/from stand Pt Will Perform Upper Body Dressing: with set-up;with  supervision;sitting Pt Will Perform Lower Body Dressing: with min assist;sit to/from stand Pt Will Transfer to Toilet: with min guard assist;ambulating;regular height toilet;bedside commode;grab bars Pt Will Perform Toileting - Clothing Manipulation and hygiene: with min guard assist;sit to/from stand Additional ADL Goal #1: Pt will be able to actively participate in 25 mins therapeutic activity with no more than 2 rests.   OT Frequency: Min 2X/week   Barriers to D/C:            Co-evaluation              End of Session Equipment Utilized During Treatment: Oxygen  Activity Tolerance: Patient limited by fatigue Patient left: in bed;with call bell/phone within reach;with family/visitor present   Time: 4917-9150 OT Time Calculation (min): 36 min Charges:  OT General Charges $OT Visit: 1 Procedure OT Treatments $Self Care/Home Management : 8-22 mins $Therapeutic Exercise: 8-22 mins G-Codes:    Yen Wandell M 2014/07/29, 5:14 PM

## 2014-07-09 NOTE — Progress Notes (Signed)
OT Cancellation Note  Patient Details Name: [name] MRN: [id number] DOB: [birth year]   Cancelled Treatment:    Reason Eval/Treat Not Completed: Medical issues which prohibited therapy - Pt with elevated HR this am.  Currently HR 87.  Spoke with pt and with RN.  Pt is currently fatigued.  Will defer OT this morning and check back this pm.   Darlina Rumpf [place], OTR/L [phone]  07/09/2014, 12:29 PM

## 2014-07-09 NOTE — Progress Notes (Signed)
Patient ID: [name], male   DOB: June 21, 1936, 79 y.o.   MRN: [id number]     Patient Name: [name] Date of Encounter: 07/09/2014     Principal Problem:   Symptomatic anemia Active Problems:   Coronary atherosclerosis   Cardiomyopathy, ischemic- EF 20-25% 2D 08/08/13   Persistent atrial fibrillation   COPD (chronic obstructive pulmonary disease) with emphysema   Biventricular automatic implantable cardioverter defibrillator in situ - MDT   Acute on chronic systolic heart failure, NYHA class 3   Decubitus ulcer of coccygeal region-excision and closure Nov 2015   Hyperkalemia   SOB (shortness of breath)   Sinus tachycardia   Hypotension   AKI (acute kidney injury)   Elevated troponin    SUBJECTIVE Remains debilitated and chronically ill. His sob is worse and HR is increased in the past hour or two. Weak. Given pain meds and somnolent.  CURRENT MEDS . budesonide  0.5 mg Nebulization BID  . digoxin  0.125 mg Oral Daily  . feeding supplement (ENSURE)  1 Container Oral TID BM  . furosemide  60 mg Oral BID  . guaiFENesin  600 mg Oral BID  . insulin aspart  0-15 Units Subcutaneous TID WC  . insulin aspart  0-5 Units Subcutaneous QHS  . metoprolol tartrate  75 mg Oral BID  . multivitamin with minerals  1 tablet Oral Daily  . pantoprazole  40 mg Oral BID  . predniSONE  10 mg Oral Q breakfast  . sodium hypochlorite   Irrigation BID  . tiotropium  18 mcg Inhalation Daily  . vitamin A  8,000 Units Oral Daily  . vitamin C  500 mg Oral Daily  . zinc sulfate  220 mg Oral Daily    OBJECTIVE  Filed Vitals:   07/08/14 2015 07/08/14 2147 07/09/14 0412 07/09/14 0821  BP: 113/40  104/51 128/108  Pulse: 106  99 144  Temp: 98.2 F (36.8 C)  99 F (37.2 C)   TempSrc: Oral  Oral   Resp: 18     Height:      Weight:   142 lb 1.6 oz (64.456 kg)   SpO2: 100% 99% 99%     Intake/Output Summary (Last 24 hours) at 07/09/14 0827 Last data filed at 07/09/14 0300  Gross per 24  hour  Intake      0 ml  Output    775 ml  Net   -775 ml   Filed Weights   07/07/14 0545 07/08/14 0500 07/09/14 0412  Weight: 145 lb 1.6 oz (65.817 kg) 143 lb 11.8 oz (65.2 kg) 142 lb 1.6 oz (64.456 kg)    PHYSICAL EXAM  General: Pleasant, NAD. Neuro: Alert and oriented X 3. Moves all extremities spontaneously. Psych: Normal affect. HEENT:  Normal  Neck: Supple without bruits or JVD. Lungs:  Resp regular and unlabored, CTA. Heart: IRRR no s3, s4, or murmurs. Abdomen: Soft, non-tender, non-distended, BS + x 4.  Extremities: No clubbing, cyanosis or edema. DP/PT/Radials 2+ and equal bilaterally.  Accessory Clinical Findings  CBC  Recent Labs  07/08/14 0432  WBC 9.9  HGB 8.4*  HCT 26.7*  MCV 96.7  PLT 387   Basic Metabolic Panel  Recent Labs  07/07/14 0534 07/08/14 0432  NA 137 136  K 4.6 4.3  CL 96 96  CO2 35* 33*  GLUCOSE 141* 148*  BUN 23 24*  CREATININE 1.09 0.99  CALCIUM 8.2* 8.3*   Liver Function Tests No results for input(s): AST, ALT,  ALKPHOS, BILITOT, PROT, ALBUMIN in the last 72 hours. No results for input(s): LIPASE, AMYLASE in the last 72 hours. Cardiac Enzymes No results for input(s): CKTOTAL, CKMB, CKMBINDEX, TROPONINI in the last 72 hours. BNP Invalid input(s): POCBNP D-Dimer No results for input(s): DDIMER in the last 72 hours. Hemoglobin A1C No results for input(s): HGBA1C in the last 72 hours. Fasting Lipid Panel No results for input(s): CHOL, HDL, LDLCALC, TRIG, CHOLHDL, LDLDIRECT in the last 72 hours. Thyroid Function Tests No results for input(s): TSH, T4TOTAL, T3FREE, THYROIDAB in the last 72 hours.  Invalid input(s): FREET3  TELE  Atrial fib with a controlled VR  Radiology/Studies  Dg Chest Port 1 View  07/01/2014   CLINICAL DATA:  Congestive heart failure. Chest pain. Atrial fibrillation.  EXAM: PORTABLE CHEST - 1 VIEW  COMPARISON:  06/27/2014  FINDINGS: Sequelae of prior CABG are again identified. ICD remains in place.  Cardiac silhouette remains enlarged. There is increased retrocardiac opacity in the left lung base with partial obscuration of the left hemidiaphragm. A small left pleural effusion is questioned. The right lung remains grossly clear. No pneumothorax is identified. Right upper quadrant abdominal surgical clips are noted.  IMPRESSION: Left basilar atelectasis versus infiltrate. Question small left pleural effusion.   Electronically Signed   By: Logan Bores   On: 07/01/2014 10:49   Dg Chest Port 1 View  06/28/2014   CLINICAL DATA:  Constant short of breath. Chest pain. Leg swelling.  EXAM: PORTABLE CHEST - 1 VIEW  COMPARISON:  Radiograph 06/03/2014  FINDINGS: Left-sided pacemaker overlies stable enlarged cardiac silhouette. No effusion, infiltrate, pneumothorax.  IMPRESSION: Cardiomegaly without acute findings .   Electronically Signed   By: Suzy Bouchard M.D.   On: 06/28/2014 00:00    ASSESSMENT AND PLAN  1. Atrial fib with an uncontrolled VR 2. Endstage cardiomyopathy 3. Acute on chronic systolic heart failure 4. Sacral decubitus Rec: Will uptitrate his metoprolol and continue digoxin. I appreciate help from our wound care service. Unfortunately he is not a candidate for general anesthesia. Will treat decubitus conservatively. His rate remains too fast and at this point main question once rate controlled is where he can go. Either hospice/home care or SNF. Appreciate PT/OT  Gregg Taylor,M.D.  07/09/2014 8:27 AM

## 2014-07-09 NOTE — Progress Notes (Signed)
Pt heart rate elevated in 140's wide QRS. Dr. Lovena Le on uni aware of elevated heart rate. order to give am dose of 75mg  of lopressor.

## 2014-07-09 NOTE — Progress Notes (Signed)
Physical Therapy Treatment Patient Details Name: [name] MRN: [id number] DOB: 08/13/1935 Today's Date: 07/09/2014    History of Present Illness MATHAYUS STANBERY is a 79 y.o. male with a history of CAD, Ischemic Cardiomyopathy, Systolic CHF, COPD, Chronic Respiratory Failure , HTN , Hyperlipidemia, DM2, Remote history of Colon Cancer who presents to the ED with complaints of SOB and Chest pain . Shortness of breath was transient and improved after being placed on BiPAP in the ER. BP was 035 systolic HR 009-381. He was found to be anemic. On 12/30 patient moved to ICU secondary to Medical issues including c/o of chest pain, short of breath, Pt HR sustaining 150s-170s and elevated troponin.     PT Comments    Wife concerned about her ability to safely care for him at home and is requesting to speak with CIR coordinators. Pt did require mod assist to stand from chair with arm rests. They are also concerned with is his inability to perform toilet hygiene at home due to difficulty mobilizing on toilet. He reports his Lt knee buckles at times and this has resulted in falls in the recent past, however pt has only been able to tolerate up to 50 foot ambulatory bouts at this time due to fatigue and I have not noticed any buckling. Updated recommendation to CIR at this time as pt will likely require additional rehab prior to d/c home due to medical complexity, and overall functional dependence on his wife.  Follow Up Recommendations  CIR     Equipment Recommendations  None recommended by PT    Recommendations for Other Services Rehab consult     Precautions / Restrictions Precautions Precautions: Fall Restrictions Weight Bearing Restrictions: No    Mobility  Bed Mobility Overal bed mobility: Modified Independent                Transfers Overall transfer level: Needs assistance Equipment used: Rolling walker (2 wheeled) Transfers: Sit to/from Stand Sit to Stand: Mod assist          General transfer comment: Mod assist for boost to stand from chair with arm rests. Required two attemtps with cues for positioning and hand placement. Min assist for boost and balance from highly elevated bed surface, performed x2 today.  Ambulation/Gait Ambulation/Gait assistance: Min guard Ambulation Distance (Feet): 50 Feet Assistive device: Rolling walker (2 wheeled) Gait Pattern/deviations: Step-through pattern;Decreased stride length;Shuffle;Trunk flexed Gait velocity: decreased   General Gait Details: Focused on improving stride length and upright posture with intermittent VC. Pt did not wish to ambulate further due to LE fatigue and concern for LLE buckling however none was noted during this bout. HR to 120s, SpO2 97% and greater on room air, although 2/4 dyspnea. Lacks terminal knee extension on Lt, possibly Rt as well.   Stairs            Wheelchair Mobility    Modified Rankin (Stroke Patients Only)       Balance                                    Cognition Arousal/Alertness: Awake/alert Behavior During Therapy: WFL for tasks assessed/performed Overall Cognitive Status: Within Functional Limits for tasks assessed                      Exercises General Exercises - Lower Extremity Ankle Circles/Pumps: AROM;Both;Supine;15 reps Quad Sets: Strengthening;Both;10 reps;Supine Long  Arc Quad: Strengthening;Both;10 reps;Seated Hip Flexion/Marching: Strengthening;Both;10 reps;Seated    General Comments        Pertinent Vitals/Pain Pain Assessment: 0-10 Pain Score:  ("It just hurts") Pain Location: back Pain Intervention(s): Monitored during session;Repositioned  HR 90s- 115 SpO2 97-100% on room air    Home Living                      Prior Function            PT Goals (current goals can now be found in the care plan section) Acute Rehab PT Goals PT Goal Formulation: With patient/family Time For Goal Achievement:  07/16/14 Potential to Achieve Goals: Good Progress towards PT goals: Progressing toward goals    Frequency  Min 3X/week    PT Plan Discharge plan needs to be updated    Co-evaluation             End of Session Equipment Utilized During Treatment: Gait belt Activity Tolerance: Patient limited by fatigue Patient left: with call bell/phone within reach;with family/visitor present;in chair     Time: 2081-3887 PT Time Calculation (min) (ACUTE ONLY): 36 min  Charges:  $Gait Training: 8-22 mins $Therapeutic Exercise: 8-22 mins                    G Codes:      Ellouise Newer 2014-07-11, 4:14 PM Camille Bal [place], [place]

## 2014-07-09 NOTE — Consult Note (Signed)
Met with the patient for referral for extra community support for chronic disease management.  Wife at the bedside and explained the services available with [facility].  Wife states she would like a brochure but patient states, "I have what I need when I get home and I don't see any need for anything extra than my Home Health care.  Answered questions and left brochure and explained that [place] [facility] does not interfere with or replace any services that he will need.  Patient still declined.  Notified inpatient RNCM of patient's decision.  For further questions please call, Natividad Brood, RN, BSN, [facility] Liaison at [phone].

## 2014-07-10 LAB — CBC
HCT: 26.4 % — ABNORMAL LOW (ref 39.0–52.0)
Hemoglobin: 8.2 g/dL — ABNORMAL LOW (ref 13.0–17.0)
MCH: 29.4 pg (ref 26.0–34.0)
MCHC: 31.1 g/dL (ref 30.0–36.0)
MCV: 94.6 fL (ref 78.0–100.0)
Platelets: 252 10*3/uL (ref 150–400)
RBC: 2.79 MIL/uL — AB (ref 4.22–5.81)
RDW: 15.8 % — ABNORMAL HIGH (ref 11.5–15.5)
WBC: 10.8 10*3/uL — AB (ref 4.0–10.5)

## 2014-07-10 LAB — BASIC METABOLIC PANEL
Anion gap: 13 (ref 5–15)
BUN: 27 mg/dL — ABNORMAL HIGH (ref 6–23)
CO2: 27 mmol/L (ref 19–32)
Calcium: 8.1 mg/dL — ABNORMAL LOW (ref 8.4–10.5)
Chloride: 92 mEq/L — ABNORMAL LOW (ref 96–112)
Creatinine, Ser: 0.94 mg/dL (ref 0.50–1.35)
GFR calc Af Amer: >90 mL/min (ref >90)
GFR calc non Af Amer: 79 mL/min — ABNORMAL LOW (ref >90)
Glucose, Bld: 195 mg/dL — ABNORMAL HIGH (ref 70–99)
Potassium: 4.3 mmol/L (ref 3.5–5.1)
SODIUM: 132 mmol/L — AB (ref 135–145)

## 2014-07-10 LAB — GLUCOSE, CAPILLARY
GLUCOSE-CAPILLARY: 129 mg/dL — AB (ref 70–99)
Glucose-Capillary: 201 mg/dL — ABNORMAL HIGH (ref 70–99)
Glucose-Capillary: 221 mg/dL — ABNORMAL HIGH (ref 70–99)
Glucose-Capillary: 128 mg/dL — ABNORMAL HIGH (ref 70–99)

## 2014-07-10 MED ORDER — METOPROLOL TARTRATE 100 MG PO TABS
100.0000 mg | ORAL_TABLET | Freq: Two times a day (BID) | ORAL | Status: DC
  Administered 2014-07-10 – 2014-07-17 (×13): 100 mg via ORAL
  Filled 2014-07-10 (×16): qty 1

## 2014-07-10 NOTE — Consult Note (Signed)
Received a call from [place], RN that the patient and wife were reconsidering signing up for [place] [facility] services.  RNCM spoke with the patient and his wife regarding the services and their concerns about the cost of the services.  Explained how [facility] care management is no cost to members that primary care providers and insurance is in network. Any further questions, please call Natividad Brood, RN, BSN, [facility] Liaison at [phone].

## 2014-07-10 NOTE — Progress Notes (Signed)
Patient ID: [name], male   DOB: [birth year], 79 y.o.   MRN: [id number]     Patient Name: [name] Date of Encounter: 07/10/2014     Principal Problem:   Symptomatic anemia Active Problems:   Coronary atherosclerosis   Cardiomyopathy, ischemic- EF 20-25% 2D 08/08/13   Persistent atrial fibrillation   COPD (chronic obstructive pulmonary disease) with emphysema   Biventricular automatic implantable cardioverter defibrillator in situ - MDT   Acute on chronic systolic heart failure, NYHA class 3   Decubitus ulcer of coccygeal region-excision and closure Nov 2015   Hyperkalemia   SOB (shortness of breath)   Sinus tachycardia   Hypotension   AKI (acute kidney injury)   Elevated troponin    SUBJECTIVE Remains debilitated and chronically ill. His sob is nearing baseline. He remain weak. I discussed treatment options.  CURRENT MEDS . budesonide  0.5 mg Nebulization BID  . digoxin  0.125 mg Oral Daily  . feeding supplement (ENSURE)  1 Container Oral TID BM  . furosemide  60 mg Oral BID  . guaiFENesin  600 mg Oral BID  . insulin aspart  0-15 Units Subcutaneous TID WC  . insulin aspart  0-5 Units Subcutaneous QHS  . metoprolol tartrate  75 mg Oral BID  . multivitamin with minerals  1 tablet Oral Daily  . pantoprazole  40 mg Oral BID  . predniSONE  10 mg Oral Q breakfast  . sodium hypochlorite   Irrigation BID  . tiotropium  18 mcg Inhalation Daily  . vitamin A  8,000 Units Oral Daily  . vitamin C  500 mg Oral Daily  . zinc sulfate  220 mg Oral Daily    OBJECTIVE  Filed Vitals:   07/10/14 0621 07/10/14 0849 07/10/14 1133 07/10/14 1149  BP: 104/60   103/66  Pulse: 116  98   Temp: 98.4 F (36.9 C)     TempSrc: Oral     Resp: 18     Height:      Weight: 143 lb (64.864 kg)     SpO2: 98% 97%      Intake/Output Summary (Last 24 hours) at 07/10/14 1229 Last data filed at 07/10/14 0908  Gross per 24 hour  Intake    357 ml  Output    400 ml  Net    -43 ml    Filed Weights   07/08/14 0500 07/09/14 0412 07/10/14 0621  Weight: 143 lb 11.8 oz (65.2 kg) 142 lb 1.6 oz (64.456 kg) 143 lb (64.864 kg)    PHYSICAL EXAM  General: Pleasant, NAD. Neuro: Alert and oriented X 3. Moves all extremities spontaneously. Psych: Normal affect. HEENT:  Normal  Neck: Supple without bruits or JVD. Lungs:  Resp regular and unlabored, CTA. Heart: IRRR no s3, s4, or murmurs. Abdomen: Soft, non-tender, non-distended, BS + x 4.  Extremities: No clubbing, cyanosis or edema. DP/PT/Radials 2+ and equal bilaterally.  Accessory Clinical Findings  CBC  Recent Labs  07/08/14 0432 07/10/14 0415  WBC 9.9 10.8*  HGB 8.4* 8.2*  HCT 26.7* 26.4*  MCV 96.7 94.6  PLT 228 505   Basic Metabolic Panel  Recent Labs  07/08/14 0432 07/10/14 0415  NA 136 132*  K 4.3 4.3  CL 96 92*  CO2 33* 27  GLUCOSE 148* 195*  BUN 24* 27*  CREATININE 0.99 0.94  CALCIUM 8.3* 8.1*   Liver Function Tests No results for input(s): AST, ALT, ALKPHOS, BILITOT, PROT, ALBUMIN in the last 72 hours.  No results for input(s): LIPASE, AMYLASE in the last 72 hours. Cardiac Enzymes No results for input(s): CKTOTAL, CKMB, CKMBINDEX, TROPONINI in the last 72 hours. BNP Invalid input(s): POCBNP D-Dimer No results for input(s): DDIMER in the last 72 hours. Hemoglobin A1C No results for input(s): HGBA1C in the last 72 hours. Fasting Lipid Panel No results for input(s): CHOL, HDL, LDLCALC, TRIG, CHOLHDL, LDLDIRECT in the last 72 hours. Thyroid Function Tests No results for input(s): TSH, T4TOTAL, T3FREE, THYROIDAB in the last 72 hours.  Invalid input(s): FREET3  TELE  Atrial fib with a controlled/rapid VR  Radiology/Studies  Dg Chest Port 1 View  07/09/2014   CLINICAL DATA:  79 year old male with chronic systolic heart failure and severe shortness of breath, chest and back pain beading earlier this morning  EXAM: PORTABLE CHEST - 1 VIEW  COMPARISON:  Prior chest x-ray 07/01/2014   FINDINGS: Stable position of left subclavian approach biventricular cardiac rhythm maintenance device. Leads project over the right atrium, right ventricle and in the cardiac vein overlying the left ventricle. Patient is status post median sternotomy with evidence of multivessel CABG including LIMA bypass. Atherosclerotic calcifications again noted in the transverse aorta. The cardiac and mediastinal contours remain stable. No overt pulmonary edema, pleural effusion, pneumothorax or focal airspace consolidation. Similar appearance of left basilar opacity likely reflecting a small pleural effusion and associated atelectasis. This is unchanged compared to 07/01/2014. Prominent right nipple shadow again noted. No acute osseous abnormality.  IMPRESSION: 1. Unchanged appearance of the chest compared to 07/01/2014. Persistent small left pleural effusion and associated left basilar opacity favored to reflect pleural fluid with atelectasis. Superimposed infiltrate is difficult to exclude. 2. No evidence of acute pulmonary edema or CHF.   Electronically Signed   By: Jacqulynn Cadet M.D.   On: 07/09/2014 09:20   Dg Chest Port 1 View  07/01/2014   CLINICAL DATA:  Congestive heart failure. Chest pain. Atrial fibrillation.  EXAM: PORTABLE CHEST - 1 VIEW  COMPARISON:  06/27/2014  FINDINGS: Sequelae of prior CABG are again identified. ICD remains in place. Cardiac silhouette remains enlarged. There is increased retrocardiac opacity in the left lung base with partial obscuration of the left hemidiaphragm. A small left pleural effusion is questioned. The right lung remains grossly clear. No pneumothorax is identified. Right upper quadrant abdominal surgical clips are noted.  IMPRESSION: Left basilar atelectasis versus infiltrate. Question small left pleural effusion.   Electronically Signed   By: Logan Bores   On: 07/01/2014 10:49   Dg Chest Port 1 View  06/28/2014   CLINICAL DATA:  Constant short of breath. Chest pain.  Leg swelling.  EXAM: PORTABLE CHEST - 1 VIEW  COMPARISON:  Radiograph 06/03/2014  FINDINGS: Left-sided pacemaker overlies stable enlarged cardiac silhouette. No effusion, infiltrate, pneumothorax.  IMPRESSION: Cardiomegaly without acute findings .   Electronically Signed   By: Suzy Bouchard M.D.   On: 06/28/2014 00:00    ASSESSMENT AND PLAN  1. Atrial fib with an uncontrolled VR 2. Endstage cardiomyopathy 3. Acute on chronic systolic heart failure 4. Sacral decubitus Rec: Will continue to uptitrate his metoprolol and continue digoxin. I appreciate help from our wound care service. Will treat decubitus conservatively. His rate remains too fast and at this point main question once rate controlled is where he can go. We have increased his metoprolol to 100 mg twice daily. Either hospice/home care or SNF. Appreciate PT/OT. I suspect he will need SNF but he would prefer to go to home with home  care.  Carleene Overlie Taylor,M.D.  07/10/2014 12:29 PM

## 2014-07-10 NOTE — Clinical Social Work Note (Signed)
CSW informed during rounds that patient refusing SNF.  Please reconsult CSW if patient becomes agreeable to SNF placement.  Domenica Reamer, [place] Social Worker [phone]

## 2014-07-10 NOTE — Progress Notes (Signed)
Patient had afib episode HR went up to 139 and administered PRN metoprolol 5mg . Patient stated he felt normal. No evidence of SOB or distress. Other VSS.    Will continue to monitor.   Earlie Lou

## 2014-07-10 NOTE — Progress Notes (Signed)
@   approximately 2100 on 07/09/2014, pt went into A Fib with RVR. Metoprolol 75 mg po given stat as ordered . Xanax also given. O2 increased to 4L. MD called as this Probation officer stayed with pt & assisted with slow deep breaths. Prior to peaking to Dr Philbert Riser,  Pt's HR dropped. Requisted and received Lopressor 5 MG IV PRN. Pt did not need the PRN

## 2014-07-11 ENCOUNTER — Other Ambulatory Visit: Payer: Self-pay | Admitting: Internal Medicine

## 2014-07-11 ENCOUNTER — Telehealth: Payer: Self-pay | Admitting: Pulmonary Disease

## 2014-07-11 LAB — GLUCOSE, CAPILLARY
GLUCOSE-CAPILLARY: 130 mg/dL — AB (ref 70–99)
Glucose-Capillary: 115 mg/dL — ABNORMAL HIGH (ref 70–99)
Glucose-Capillary: 205 mg/dL — ABNORMAL HIGH (ref 70–99)
Glucose-Capillary: 158 mg/dL — ABNORMAL HIGH (ref 70–99)

## 2014-07-11 MED ORDER — DILTIAZEM HCL ER COATED BEADS 120 MG PO CP24
120.0000 mg | ORAL_CAPSULE | Freq: Every day | ORAL | Status: DC
  Administered 2014-07-11 – 2014-07-17 (×7): 120 mg via ORAL
  Filled 2014-07-11 (×7): qty 1

## 2014-07-11 NOTE — Progress Notes (Signed)
Physical Therapy Wound Treatment Patient Details  Name: [name] MRN: [id number] Date of Birth: [birth year]  Today's Date: 07/11/2014 Time:  - 9:29-1000    Subjective  Subjective: Just don't hurt me Patient and Family Stated Goals: get this bottom feeling better Date of Onset:  (unknown) Prior Treatments: many treatments over the year  Pain Score: Pain Score: 3   Wound Assessment  Pressure Ulcer 06/28/14 Stage IV - Full thickness tissue loss with exposed bone, tendon or muscle. see [place] RN progress notes (80%red, 20% yellow slough,bone palpable)  (Active)  Dressing Type Moist to dry 07/11/2014 10:02 AM  Dressing Clean;Dry;Intact 07/11/2014 10:02 AM  Dressing Change Frequency Twice a day 07/11/2014 10:02 AM  State of Healing Eschar 07/11/2014 10:02 AM  Site / Wound Assessment Granulation tissue;Yellow;Black 07/11/2014 10:02 AM  % Wound base Red or Granulating 75% 07/11/2014 10:02 AM  % Wound base Yellow 0% 07/11/2014 10:02 AM  % Wound base Black 25% 07/11/2014 10:02 AM  % Wound base Other (Comment) 0% 07/11/2014 10:02 AM  Peri-wound Assessment Intact;Erythema (blanchable) 07/11/2014 10:02 AM  Wound Length (cm) 7.5 cm 07/11/2014 10:02 AM  Wound Width (cm) 6.25 cm 07/11/2014 10:02 AM  Wound Depth (cm) 4 cm 07/11/2014 10:02 AM  Tunneling (cm) 0 07/11/2014 10:02 AM  Undermining (cm) 2 cm at 1:00; 1.5 cm at 12:00 07/11/2014 10:02 AM  Margins Unattached edges (unapproximated) 07/11/2014 10:02 AM  Drainage Amount Moderate 07/11/2014 10:02 AM  Drainage Description Odor;Purulent 07/11/2014 10:02 AM  Treatment Hydrotherapy (Pulse lavage);Debridement (Selective) 07/11/2014 10:02 AM     Hydrotherapy Pulsed lavage therapy - wound location: sacrum Pulsed Lavage with Suction (psi): 8 psi Pulsed Lavage with Suction - Normal Saline Used: 1000 mL Pulsed Lavage Tip: Tip with splash shield Selective Debridement Selective Debridement - Location: sacrum Selective Debridement - Tools Used: Forceps;Scissors Selective Debridement  - Tissue Removed: eschar, fibrin   Wound Assessment and Plan  Wound Therapy - Assess/Plan/Recommendations Wound Therapy - Clinical Statement: Pt can benefit from hydrotherapy and selective debridement to take this wound from chronic to more acute to promote progress toward healing. Wound Therapy - Functional Problem List: deconditioned Factors Delaying/Impairing Wound Healing: Altered sensation;Multiple medical problems Hydrotherapy Plan: Debridement;Dressing change;Patient/family education;Pulsatile lavage with suction Wound Therapy - Frequency: 6X / week Wound Therapy - Current Recommendations: PT;OT Wound Therapy - Follow Up Recommendations: Home health RN Wound Plan: see above  Wound Therapy Goals- Improve the function of patient's integumentary system by progressing the wound(s) through the phases of wound healing (inflammation - proliferation - remodeling) by: Decrease Necrotic Tissue to: 10 Decrease Necrotic Tissue - Progress: Goal set today Increase Granulation Tissue to: 90 Increase Granulation Tissue - Progress: Goal set today Decrease Length/Width/Depth by (cm): depth by 1 cm Decrease Length/Width/Depth - Progress: Goal set today Improve Drainage Characteristics: Min Improve Drainage Characteristics - Progress: Goal set today  Goals will be updated until maximal potential achieved or discharge criteria met.  Discharge criteria: when goals achieved, discharge from hospital, MD decision/surgical intervention, no progress towards goals, refusal/missing three consecutive treatments without notification or medical reason.  [name]     [name] 07/11/2014, 10:14 AM 07/11/2014 [name], [name]

## 2014-07-11 NOTE — Progress Notes (Signed)
SUBJECTIVE: The patient is doing well today.  He wants to go home.  At this time, he denies chest pain, shortness of breath, or any new concerns.  . budesonide  0.5 mg Nebulization BID  . digoxin  0.125 mg Oral Daily  . diltiazem  120 mg Oral Daily  . feeding supplement (ENSURE)  1 Container Oral TID BM  . furosemide  60 mg Oral BID  . guaiFENesin  600 mg Oral BID  . insulin aspart  0-15 Units Subcutaneous TID WC  . insulin aspart  0-5 Units Subcutaneous QHS  . metoprolol tartrate  100 mg Oral BID  . multivitamin with minerals  1 tablet Oral Daily  . pantoprazole  40 mg Oral BID  . predniSONE  10 mg Oral Q breakfast  . tiotropium  18 mcg Inhalation Daily  . vitamin A  8,000 Units Oral Daily  . vitamin C  500 mg Oral Daily  . zinc sulfate  220 mg Oral Daily      OBJECTIVE: Physical Exam: Filed Vitals:   07/10/14 2124 07/11/14 0538 07/11/14 0750 07/11/14 1028  BP:  98/56  157/109  Pulse: 100 92  103  Temp:  98.9 F (37.2 C)    TempSrc:  Oral    Resp:  18    Height:      Weight:  152 lb 1.9 oz (69 kg)    SpO2:  98% 100%     Intake/Output Summary (Last 24 hours) at 07/11/14 1200 Last data filed at 07/11/14 2119  Gross per 24 hour  Intake    240 ml  Output    800 ml  Net   -560 ml    Telemetry reveals afib with frequent elevation of V rates  GEN- The patient is chronically ill and frail appearing, alert and oriented x 3 today.   Head- normocephalic, atraumatic Eyes-  Sclera clear, conjunctiva pink Ears- hearing intact Oropharynx- clear Neck- supple,  Lungs- decreased BS at the bases, normal work of breathing Heart- irregular rate and rhythm  GI- soft, NT, ND, + BS Extremities- no clubbing, cyanosis, or edema  LABS: Basic Metabolic Panel:  Recent Labs  07/10/14 0415  NA 132*  K 4.3  CL 92*  CO2 27  GLUCOSE 195*  BUN 27*  CREATININE 0.94  CALCIUM 8.1*   Liver Function Tests: No results for input(s): AST, ALT, ALKPHOS, BILITOT, PROT, ALBUMIN in  the last 72 hours. No results for input(s): LIPASE, AMYLASE in the last 72 hours. CBC:  Recent Labs  07/10/14 0415  WBC 10.8*  HGB 8.2*  HCT 26.4*  MCV 94.6  PLT 252   ASSESSMENT AND PLAN:  Principal Problem:   Symptomatic anemia Active Problems:   Coronary atherosclerosis   Cardiomyopathy, ischemic- EF 20-25% 2D 08/08/13   Persistent atrial fibrillation   COPD (chronic obstructive pulmonary disease) with emphysema   Biventricular automatic implantable cardioverter defibrillator in situ - MDT   Acute on chronic systolic heart failure, NYHA class 3   Decubitus ulcer of coccygeal region-excision and closure Nov 2015   Hyperkalemia   SOB (shortness of breath)   Sinus tachycardia   Hypotension   AKI (acute kidney injury)   Elevated troponin  1. Persistent afib V rates are elevated Not a good candidate for AAD therapy Not felt to be a candidate for anesthesia by Dr Lovena Le Will add diltiazem CD 120mg  daily today Given anemia, I doubt that he could tolerate anticoagulation though chads2vasc score is at least 8  Will therefore rate control long term  2. Anemia Stable Hold off on anticoagualtion  3. Failure to thrive He is chronically ill He wants to go home  I think that a palliative approach is best given his poor prognosis Dr Lovena Le has mentioned home hospice but patient says that he has not seen palliative care previously.  The patient is eager to have this discussion.  I will therefore place palliative care consult  General cardiology to manage over the weekend and for duration of hospital stay Call with questions  Thompson Grayer, MD 07/11/2014 12:00 PM

## 2014-07-12 DIAGNOSIS — Z515 Encounter for palliative care: Secondary | ICD-10-CM

## 2014-07-12 DIAGNOSIS — R52 Pain, unspecified: Secondary | ICD-10-CM

## 2014-07-12 DIAGNOSIS — R410 Disorientation, unspecified: Secondary | ICD-10-CM | POA: Diagnosis not present

## 2014-07-12 LAB — GLUCOSE, CAPILLARY
GLUCOSE-CAPILLARY: 138 mg/dL — AB (ref 70–99)
GLUCOSE-CAPILLARY: 167 mg/dL — AB (ref 70–99)
Glucose-Capillary: 117 mg/dL — ABNORMAL HIGH (ref 70–99)
Glucose-Capillary: 165 mg/dL — ABNORMAL HIGH (ref 70–99)
Glucose-Capillary: 174 mg/dL — ABNORMAL HIGH (ref 70–99)

## 2014-07-12 MED ORDER — HALOPERIDOL 1 MG PO TABS
1.0000 mg | ORAL_TABLET | Freq: Every evening | ORAL | Status: DC | PRN
  Filled 2014-07-12: qty 1

## 2014-07-12 MED ORDER — GLUCERNA SHAKE PO LIQD
237.0000 mL | Freq: Two times a day (BID) | ORAL | Status: DC
  Administered 2014-07-12 – 2014-07-17 (×8): 237 mL via ORAL
  Filled 2014-07-12 (×4): qty 237

## 2014-07-12 MED ORDER — ALPRAZOLAM 0.25 MG PO TABS
0.2500 mg | ORAL_TABLET | Freq: Every day | ORAL | Status: DC | PRN
  Administered 2014-07-12 – 2014-07-16 (×7): 0.25 mg via ORAL
  Filled 2014-07-12 (×8): qty 1

## 2014-07-12 MED ORDER — OXYCODONE-ACETAMINOPHEN 5-325 MG PO TABS
1.0000 | ORAL_TABLET | ORAL | Status: DC | PRN
  Administered 2014-07-12 – 2014-07-17 (×14): 2 via ORAL
  Filled 2014-07-12 (×14): qty 2

## 2014-07-12 NOTE — Progress Notes (Signed)
Primary cardiologist: Dr. Daneen Schick EP: Dr. Cristopher Peru  Seen for followup: Atrial fibrillation, cardiomyopathy, failure to thrive  Subjective:    Patient weak, did eat a meal reasonably well per wife. Slept a lot over the morning. No chest pain.  Objective:   Temp:  [97.5 F (36.4 C)-98.7 F (37.1 C)] 97.9 F (36.6 C) (01/10 0502) Pulse Rate:  [82-108] 108 (01/10 0935) Resp:  [17-18] 18 (01/10 0502) BP: (86-137)/(38-107) 137/107 mmHg (01/10 0935) SpO2:  [97 %-100 %] 99 % (01/10 0502) Last BM Date: 07/10/14  Filed Weights   07/09/14 0412 07/10/14 0621 07/11/14 0538  Weight: 142 lb 1.6 oz (64.456 kg) 143 lb (64.864 kg) 152 lb 1.9 oz (69 kg)    Intake/Output Summary (Last 24 hours) at 07/12/14 1302 Last data filed at 07/12/14 0800  Gross per 24 hour  Intake    477 ml  Output    600 ml  Net   -123 ml    Telemetry: Intermittent pacing and atrial fibrillation.  Exam:  General: Frail, no distress.  Lungs: Decreased breath sounds, nonlabored.  Cardiac: Distant, irregular.  Extremities: No pitting edema.  Lab Results:  Basic Metabolic Panel:  Recent Labs Lab 07/07/14 0534 07/08/14 0432 07/10/14 0415  NA 137 136 132*  K 4.6 4.3 4.3  CL 96 96 92*  CO2 35* 33* 27  GLUCOSE 141* 148* 195*  BUN 23 24* 27*  CREATININE 1.09 0.99 0.94  CALCIUM 8.2* 8.3* 8.1*    CBC:  Recent Labs Lab 07/08/14 0432 07/10/14 0415  WBC 9.9 10.8*  HGB 8.4* 8.2*  HCT 26.7* 26.4*  MCV 96.7 94.6  PLT 228 252    Imaging: EXAM: PORTABLE CHEST - 1 VIEW  COMPARISON: Prior chest x-ray 07/01/2014  FINDINGS: Stable position of left subclavian approach biventricular cardiac rhythm maintenance device. Leads project over the right atrium, right ventricle and in the cardiac vein overlying the left ventricle. Patient is status post median sternotomy with evidence of multivessel CABG including LIMA bypass. Atherosclerotic calcifications again noted in the transverse  aorta. The cardiac and mediastinal contours remain stable. No overt pulmonary edema, pleural effusion, pneumothorax or focal airspace consolidation. Similar appearance of left basilar opacity likely reflecting a small pleural effusion and associated atelectasis. This is unchanged compared to 07/01/2014. Prominent right nipple shadow again noted. No acute osseous abnormality.  IMPRESSION: 1. Unchanged appearance of the chest compared to 07/01/2014. Persistent small left pleural effusion and associated left basilar opacity favored to reflect pleural fluid with atelectasis. Superimposed infiltrate is difficult to exclude. 2. No evidence of acute pulmonary edema or CHF.   Medications:   Scheduled Medications: . budesonide  0.5 mg Nebulization BID  . digoxin  0.125 mg Oral Daily  . diltiazem  120 mg Oral Daily  . feeding supplement (ENSURE)  1 Container Oral TID BM  . furosemide  60 mg Oral BID  . guaiFENesin  600 mg Oral BID  . insulin aspart  0-15 Units Subcutaneous TID WC  . insulin aspart  0-5 Units Subcutaneous QHS  . metoprolol tartrate  100 mg Oral BID  . multivitamin with minerals  1 tablet Oral Daily  . pantoprazole  40 mg Oral BID  . predniSONE  10 mg Oral Q breakfast  . tiotropium  18 mcg Inhalation Daily  . vitamin A  8,000 Units Oral Daily  . vitamin C  500 mg Oral Daily  . zinc sulfate  220 mg Oral Daily     PRN  Medications: acetaminophen **OR** acetaminophen, ALPRAZolam, alum & mag hydroxide-simeth, haloperidol, HYDROmorphone (DILAUDID) injection, levalbuterol, metoprolol, ondansetron **OR** ondansetron (ZOFRAN) IV, oxyCODONE-acetaminophen   Assessment:   1. Paroxysmal atrial fibrillation with RVR. Not on anticoagulation with symptomatic anemia. Recent chart notes reviewed. Currently on Cardizem CD, Lopressor, and Lanoxin. Medical therapy titration has been limited by blood pressure. He has been seen by EP, felt to be poor candidate for antiarrhythmic  therapy.  2. NSVT.  3. Ischemic cardiomyopathy with LVEF approximately 20%. Patient with Medtronic biventricular ICD in place, followed by EP.  4. Demand ischemia, peak troponin I 0.82.  5. 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  6. Severe COPD.  7. Symptomatic anemia.  8. Failure to thrive.  9. Sacral decubitus.  10. Acute renal insufficiency, creatinine currently 0.9.   Plan/Discussion:    Complex patient - just met him this morning on rounds. Reviewed chart including Dr. Jackalyn Lombard note from yesterday. He was seen by Palliative care, their note is reviewed and appreciated. Plan at this time is evaluation by the case manager for outpatient hospice referral. Patient and wife preparing to go home, hopefully soon. Continue current medical regimen.   Satira Sark, M.D., F.A.C.C.

## 2014-07-12 NOTE — Progress Notes (Signed)
Pt HR 120's-140's. Pt was unable to receive PRN Metoprolol IV due to low BP; 86/65. M.D notified.   M.D returned page to RN and no new orders were given. Pt HR came back down to low 100's.RN will continue to monitor .

## 2014-07-12 NOTE — Progress Notes (Signed)
Report given to [place], picked up pt from my assignment. Kathleen Argue S 3:15 PM

## 2014-07-12 NOTE — Care Management (Signed)
CARE MANAGEMENT NOTE 07/12/2014  Patient:  [name], [name]   Account Number:  [id number]  Date Initiated:  06/30/2014  Documentation initiated by:  [name],[name]  Subjective/Objective Assessment:   Pt adm on 06/27/14 with anemia.  PTA, pt resides at home with spouse.     Action/Plan:   PT eval pending.  Will cont to follow for dc needs as pt progresses.   Anticipated DC Date:  07/09/2014   Anticipated DC Plan:  [place]  CM consult      [facility] Choice  Resumption Of Svcs/PTA Provider   Choice offered to / List presented to:  C-1 Patient           Status of service:  In process, will continue to follow Medicare Important Message given?  YES (If response is "NO", the following Medicare IM given date fields will be blank) Date Medicare IM given:  06/30/2014 Medicare IM given by:  [name],[name] Date Additional Medicare IM given:  07/07/2014 Additional Medicare IM given by:  [name]  Discharge Disposition:    Per UR Regulation:  Reviewed for med. necessity/level of care/duration of stay  If discussed at [place] of Stay Meetings, dates discussed:   07/07/2014    Comments:  07/12/14 17:30 Cm received call from RN who states pt and wife would like to discuss home hospice.  Palliative consult this am.  CM met with pt and wife [name] and gave [name] a list for choice.  CM also gave [name] a list of Private Duty agencies for her to explore in the am.  Pt and [name] understand the PDL would be private pay.  [name] states she would like to think on this tonight and has arranged with CSW to talk tomorrow 1/11 of the option of residential hospice but understands pt will most likely NOT meed criteria.  CM will continue to monitor for choice. [name], BSN, [place] [phone].  07/07/14- 1500- Robert Gibbons RN, BSN [phone] Pt active with [facility]- RN/PT/aide- will need resumption orders at time of discharge- family also would like a lift for home-  MD please order DME- hoyer lift-  per [facility]- pt is a Lupus  and will be followed closely at time of discharge by [facility].

## 2014-07-12 NOTE — Consult Note (Signed)
Patient [name]      DOB: May 27, 1936      SHF:[id number]     Consult Note from the Palliative Medicine Team at [place] Requested by: Dr [name]     PCP: Robert Heckle, MD Reason for Consultation: [place]     Phone [phone]  Assessment/Recommendations: 79 yo male with Afib, ICM (EF 20-25%), COPD, sacral decubs who presented with chest pain/dyspnea. Found to have AKI, hyperkalemia, anemia, Afib w/RVR.  Prolonged hospital stay with difficulty in rate control 2/2 hypotension.  Palliative consulted for goals of care  1.  Code Status: Full Code - I spoke with wife today and recommended DNR. She was understandably emotional over conversation and leans that way but needs more time to make this decision which we can certainly allow. This is something hospice can work on after he leaves hospital.   2. GOC: Unfortunately [name] is a bit encephalopathic this morning and can not participate in medical decision making. I spoke with his wife [name] today who was able to tell me about conversations with Cardiology team and to get him home. She is interested in hospice care at home.  Knows [name] wants to be home and be able to be with his dog.  [name] states she will be able to get help at home and may be able to hire caregivers as well.  I have placed case manager consult for home hospice referral. I certainly think with his ongoing issues his prognosis is 6 months or less if disease process follows expected course. Wife intially told me that they dont need to see a palliative care provider, just need home hospice setup but she did at least allow me to address some issues as noted.  I also have given her my information if she needs to contact me further for questions or concerns.   He has ICD and I recommended deactivation to wife.  This again has emotional context to it and she does not want to make that decision.  This can be addressed by hospice as outpatient as well. His med list  looks reasonable but could easily discontinue vitamins as he will not receive much benefit at this point.  Not a conversation that should be had today given above, and again hospice can help address this as well.   3. Symptom Management:   1. Pain- I will simplfy med regimen some.  Can continue PRN hydromorphone in hospital. He seems to need this with wound care.  Would only do basic routine wound care and try to avoid painful interventions as possible 2. Delirium- Nrusing reports that this seems to worsen after pain meds. Xanax is probably not the best med for him with this but wife reports that he has been on for sometime so can continue.  Again will simplify meds as above.  I will make xanax daily PRN and give low dose haldol PRN at night to see if we can avoid some of the delirium issues he is having. Last QTc was <460.   3. Sacral Decub- would provide simple dressing changes/wound care.  Hospice can help manage this at home as well.    4. Psychosocial/Spiritual: Lives at home with his wife [name] and his dog.  They each have 3 children from previous relationships.  [name] has been struggling with telling his kids about his condition. [name] is contemplating best way to inform them. I have offered to be assistance in this if they wish.  Will sign off as home hospice referral is handled through case management, but if there are questions or concerns please feel free to contact me.    Brief HPI: Encephalopathic this morning. Does not remember why he is in hospital, seeing cardiologists, or that he is married even. History obtained from chart and wife.  79 yo male with ICM (EF 20-25%) s/p ICD, AFib, COPD, who presented 2 weeks ago with chest pain and shortness of breath.  On admission was found to have tachycardia, symptomatic anemia, AKI, hyperK. Hospital stay has been complicated by pulm edema, persistent Afib with RVR with difficult to control rates 2/2 hypotension, sacral decub wound care with pain  medicines leading to encephalopathy. I spoke briefly with wife who states he has not been doing well for "months" and requiring frequent hospitalization with CHF symptoms as well.  Has trouble with anxiety requiring PRN xanax which he has been taking at home.  Decreasing functional status.  Have had multiple conversations with cardiology and at this point want to get [place] home with hospice care.     ROS: Unable to obtain with acute delirium    PMH:  Past Medical History  Diagnosis Date  . PVD (peripheral vascular disease)   . HTN (hypertension)   . CHF (congestive heart failure)   . Ventricular tachycardia   . Ischemic heart disease, chronic   . Dyslipidemia   . CAD (coronary artery disease)     a. s/p CABG, last cath 2012 -severe ischemic cardiomyopathy with EF< 15%, patent vein grafts  . Self-catheterizes urinary bladder     since back surgery.  6- 7 times a day. (05/27/2014)  . Subdural hematoma 2013    a. anticoagulation discontinued at that time  . Neurogenic bladder     since back surgery - self caths  . Neurogenic bowel     digitally stimulates daily.  Marland Kitchen COPD (chronic obstructive pulmonary disease)     a. on home O2  . Emphysema   . Anemia     SERUM MONOCLONAL IgM KAPPA PROTEIN - NO CLINICAL EVIDENCE FOR PROGRESSION TO MULTIPLE MYELOMA OR OTHER PROLIFERATIVE DISORDER - PER OFFICE NOTE DR. SHERRILL DATED 12/30/13  . Pneumonia X 2  . Borderline type 2 diabetes mellitus   . TIA (transient ischemic attack) 1980's  . Colon cancer   . MRSA infection   . Permanent atrial fibrillation      PSH: Past Surgical History  Procedure Laterality Date  . Lumbar disc surgery  X 2  . Iliac artery stent Left   . Bi-ventricular implantable cardioverter defibrillator  (crt-d)  04-10-08    implantation of Medtronic Concerto Bi-V ICD [; Cristopher Peru MD  . Colonoscopy    . Polypectomy    . Insertion of iliac stent Right 06/04/2012    external   . Cardiac catheterization  2012     patent vein grafts, EF <15%  . Bi-ventricular implantable cardioverter defibrillator  (crt-d)  2009; 2014    MDT CRTD upgraded from CRTP by Dr Mitzi Davenport; gen change 2014 by Dr Lovena Le  . Endarterectomy femoral  06/28/2012    Procedure: ENDARTERECTOMY FEMORAL;  Surgeon: Serafina Mitchell, MD;  Location: [place];  Service: Vascular;  Laterality: Left;  . Patch angioplasty  06/28/2012    Procedure: PATCH ANGIOPLASTY;  Surgeon: Serafina Mitchell, MD;  Location: [facility] OR;  Service: Vascular;  Laterality: Left;  Using 0.8 cm x 15.2 cm Hemashield Patch.  . Colon surgery  03/2004  .  Cataract extraction w/ intraocular lens  implant, bilateral Bilateral   . Cholecystectomy    . Coronary artery bypass graft  1998    x3 vessels  . Femoral-popliteal bypass graft Right 03/28/2013    Procedure: BYPASS GRAFT FEMORAL-BELOW KNEE POPLITEAL ARTERY Using 59m x 80 cm Propaten Graft ,With Right Common Femoral Artery Endarterectomy.;  Surgeon: VSerafina Mitchell MD;  Location: [facility] OR;  Service: Vascular;  Laterality: Right;  . Debridment of decubitus ulcer N/A 05/18/2014    Procedure: EXCISION AND CLOSURE OF DECUBITUS ULCER 5CM;  Surgeon: MPedro Earls MD;  Location: [facility] ORS;  Service: General;  Laterality: N/A;  . Posterior lumbar fusion  X 1  . Abdominal aortagram N/A 02/21/2012    Procedure: ABDOMINAL AMaxcine Ham  Surgeon: VSerafina Mitchell MD;  [facility];  Service: Cardiovascular;  Laterality: N/A;  . Abdominal aortagram N/A 06/04/2012    Procedure: ABDOMINAL AORTAGRAM;  Surgeon: VSerafina Mitchell MD;  [facility];  Service: Cardiovascular;  Laterality: N/A;  . Biv icd genertaor change out N/A 10/22/2012    MDT CRTD generator change by Dr TLovena Le . Abdominal aortagram N/A 03/18/2013    Procedure: ABDOMINAL AMaxcine Ham  Surgeon: VSerafina Mitchell MD;  [facility];  Service: Cardiovascular;  Laterality: N/A;  . Abdominal aortagram N/A 07/02/2013    Procedure: ABDOMINAL AORTAGRAM;  Surgeon: VSerafina Mitchell MD;  [facility];  Service: Cardiovascular;  Laterality: N/A;  . Lower extremity angiogram Bilateral 07/02/2013    Procedure: LOWER EXTREMITY ANGIOGRAM;  Surgeon: VSerafina Mitchell MD;  [facility];  Service: Cardiovascular;  Laterality: Bilateral;  . Percutaneous stent intervention Right 07/02/2013    Procedure: PERCUTANEOUS STENT INTERVENTION;  Surgeon: VSerafina Mitchell MD;  [facility];  Service: Cardiovascular;  Laterality: Right;  rt ext iliac stent  . Esophagogastroduodenoscopy (egd) with propofol N/A 06/29/2014    Procedure: ESOPHAGOGASTRODUODENOSCOPY (EGD) WITH PROPOFOL;  Surgeon: JWinfield Cunas, MD;  [facility];  Service: Endoscopy;  Laterality: N/A;   I have [place] the [place] and  If appropriate update it with new information. No Known Allergies Scheduled Meds: . budesonide  0.5 mg Nebulization BID  . digoxin  0.125 mg Oral Daily  . diltiazem  120 mg Oral Daily  . feeding supplement (ENSURE)  1 Container Oral TID BM  . furosemide  60 mg Oral BID  . guaiFENesin  600 mg Oral BID  . insulin aspart  0-15 Units Subcutaneous TID WC  . insulin aspart  0-5 Units Subcutaneous QHS  . metoprolol tartrate  100 mg Oral BID  . multivitamin with minerals  1 tablet Oral Daily  . pantoprazole  40 mg Oral BID  . predniSONE  10 mg Oral Q breakfast  . tiotropium  18 mcg Inhalation Daily  . vitamin A  8,000 Units Oral Daily  . vitamin C  500 mg Oral Daily  . zinc sulfate  220 mg Oral Daily   Continuous Infusions:  PRN Meds:.acetaminophen **OR** acetaminophen, ALPRAZolam, alum & mag hydroxide-simeth, HYDROcodone-acetaminophen, HYDROmorphone (DILAUDID) injection, levalbuterol, metoprolol, ondansetron **OR** ondansetron (ZOFRAN) IV, oxyCODONE    BP 86/65 mmHg  Pulse 103  Temp(Src) 97.9 F (36.6 C) (Oral)  Resp 18  Ht _0  (1.702 m)  Wt 69 kg (152 lb 1.9 oz)  BMI 23.82 kg/m2  SpO2 99%   PPS:50   Intake/Output Summary (Last 24  hours) at 07/12/14 03810Last data filed at  07/12/14 0300  Gross per 24 hour  Intake    477 ml  Output    600 ml  Net   -123 ml   Physical Exam:  General: Alert, pleasantly confused HEENT:  [place], sclera anicteric, mmm Neck: supple Chest:   CTAB CVS: Tachy Abdomen: soft, NT, ND Ext: warm, no significant peripheral edema Neuro: can follow commands, but clearly confused.   Skin: warm/dry, decub ulcer  Labs: CBC    Component Value Date/Time   WBC 10.8* 07/10/2014 0415   WBC 8.6 12/30/2013 0941   RBC 2.79* 07/10/2014 0415   RBC 2.85* 07/01/2014 0156   RBC 3.67* 12/30/2013 0941   HGB 8.2* 07/10/2014 0415   HGB 10.6* 12/30/2013 0941   HCT 26.4* 07/10/2014 0415   HCT 33.1* 12/30/2013 0941   PLT 252 07/10/2014 0415   PLT 172 12/30/2013 0941   MCV 94.6 07/10/2014 0415   MCV 90.1 12/30/2013 0941   MCH 29.4 07/10/2014 0415   MCH 28.9 12/30/2013 0941   MCHC 31.1 07/10/2014 0415   MCHC 32.1 12/30/2013 0941   RDW 15.8* 07/10/2014 0415   RDW 16.1* 12/30/2013 0941   LYMPHSABS 2.0 06/27/2014 2314   LYMPHSABS 0.8* 12/30/2013 0941   MONOABS 1.1* 06/27/2014 2314   MONOABS 1.0* 12/30/2013 0941   EOSABS 0.1 06/27/2014 2314   EOSABS 0.0 12/30/2013 0941   BASOSABS 0.0 06/27/2014 2314   BASOSABS 0.0 12/30/2013 0941    BMET    Component Value Date/Time   NA 132* 07/10/2014 0415   NA 139 02/06/2013 1127   K 4.3 07/10/2014 0415   K 4.3 02/06/2013 1127   CL 92* 07/10/2014 0415   CO2 27 07/10/2014 0415   CO2 31* 02/06/2013 1127   GLUCOSE 195* 07/10/2014 0415   GLUCOSE 152* 02/06/2013 1127   BUN 27* 07/10/2014 0415   BUN 24.1 02/06/2013 1127   CREATININE 0.94 07/10/2014 0415   CREATININE 1.0 02/06/2013 1127   CALCIUM 8.1* 07/10/2014 0415   CALCIUM 9.4 02/06/2013 1127   GFRNONAA 79* 07/10/2014 0415   GFRAA >90 07/10/2014 0415    CMP     Component Value Date/Time   NA 132* 07/10/2014 0415   NA 139 02/06/2013 1127   K 4.3 07/10/2014 0415   K 4.3 02/06/2013 1127   CL 92*  07/10/2014 0415   CO2 27 07/10/2014 0415   CO2 31* 02/06/2013 1127   GLUCOSE 195* 07/10/2014 0415   GLUCOSE 152* 02/06/2013 1127   BUN 27* 07/10/2014 0415   BUN 24.1 02/06/2013 1127   CREATININE 0.94 07/10/2014 0415   CREATININE 1.0 02/06/2013 1127   CALCIUM 8.1* 07/10/2014 0415   CALCIUM 9.4 02/06/2013 1127   PROT 6.2 06/27/2014 2314   PROT 7.5 01/08/2013 1222   ALBUMIN 2.4* 06/27/2014 2314   ALBUMIN 3.5 01/08/2013 1222   AST 52* 06/27/2014 2314   AST 15 01/08/2013 1222   ALT 34 06/27/2014 2314   ALT 15 01/08/2013 1222   ALKPHOS 79 06/27/2014 2314   ALKPHOS 76 01/08/2013 1222   BILITOT 0.4 06/27/2014 2314   BILITOT 0.47 01/08/2013 1222   GFRNONAA 79* 07/10/2014 0415   GFRAA >90 07/10/2014 0415    1/7 CXR IMPRESSION: 1. Unchanged appearance of the chest compared to 07/01/2014. Persistent small left pleural effusion and associated left basilar opacity favored to reflect pleural fluid with atelectasis. Superimposed infiltrate is difficult to exclude. 2. No evidence of acute pulmonary edema or CHF.  Total Time: 55 minutes Greater than 50%  of this  time was spent counseling and coordinating care related to the above assessment and plan.  Doran Clay D.O. Palliative Medicine Team at [facility]  Pager: [phone] Team Phone: [phone]

## 2014-07-13 DIAGNOSIS — I959 Hypotension, unspecified: Secondary | ICD-10-CM

## 2014-07-13 LAB — MAGNESIUM: Magnesium: 2 mg/dL (ref 1.5–2.5)

## 2014-07-13 LAB — BASIC METABOLIC PANEL
Anion gap: 7 (ref 5–15)
BUN: 31 mg/dL — ABNORMAL HIGH (ref 6–23)
CALCIUM: 8 mg/dL — AB (ref 8.4–10.5)
CO2: 34 mmol/L — ABNORMAL HIGH (ref 19–32)
Chloride: 94 mEq/L — ABNORMAL LOW (ref 96–112)
Creatinine, Ser: 1.05 mg/dL (ref 0.50–1.35)
GFR calc Af Amer: 78 mL/min — ABNORMAL LOW (ref >90)
GFR, EST NON AFRICAN AMERICAN: 67 mL/min — AB (ref >90)
GLUCOSE: 123 mg/dL — AB (ref 70–99)
Potassium: 3.8 mmol/L (ref 3.5–5.1)
SODIUM: 135 mmol/L (ref 135–145)

## 2014-07-13 LAB — GLUCOSE, CAPILLARY
GLUCOSE-CAPILLARY: 117 mg/dL — AB (ref 70–99)
GLUCOSE-CAPILLARY: 185 mg/dL — AB (ref 70–99)
Glucose-Capillary: 178 mg/dL — ABNORMAL HIGH (ref 70–99)
Glucose-Capillary: 183 mg/dL — ABNORMAL HIGH (ref 70–99)

## 2014-07-13 NOTE — Progress Notes (Signed)
SUBJECTIVE:  No complaints  OBJECTIVE:   Vitals:   Filed Vitals:   07/13/14 0500 07/13/14 0743 07/13/14 0802 07/13/14 0948  BP: 108/45     Pulse: 71   87  Temp: 98.6 F (37 C)     TempSrc: Oral     Resp: 17     Height:      Weight: 150 lb 4.8 oz (68.176 kg)     SpO2: 100% 100% 100%    I&O's:   Intake/Output Summary (Last 24 hours) at 07/13/14 1143 Last data filed at 07/13/14 0824  Gross per 24 hour  Intake      0 ml  Output    950 ml  Net   -950 ml   TELEMETRY: Reviewed telemetry pt in atrial fibrillation with HR 80's:     PHYSICAL EXAM General: Well developed, well nourished, in no acute distress Head: Eyes PERRLA, No xanthomas.   Normal cephalic and atramatic  Lungs:   Clear bilaterally to auscultation and percussion. Heart:   Irregularly irregularS1 S2 Pulses are 2+ & equal. Abdomen: Bowel sounds are positive, abdomen soft and non-tender without masses  Extremities:   No clubbing, cyanosis or edema.  DP +1 Neuro: Alert and oriented X 3. Psych:  Good affect, responds appropriately   LABS: Basic Metabolic Panel:  Recent Labs  07/13/14 0316  NA 135  K 3.8  CL 94*  CO2 34*  GLUCOSE 123*  BUN 31*  CREATININE 1.05  CALCIUM 8.0*  MG 2.0   Liver Function Tests: No results for input(s): AST, ALT, ALKPHOS, BILITOT, PROT, ALBUMIN in the last 72 hours. No results for input(s): LIPASE, AMYLASE in the last 72 hours. CBC: No results for input(s): WBC, NEUTROABS, HGB, HCT, MCV, PLT in the last 72 hours. Cardiac Enzymes: No results for input(s): CKTOTAL, CKMB, CKMBINDEX, TROPONINI in the last 72 hours. BNP: Invalid input(s): POCBNP D-Dimer: No results for input(s): DDIMER in the last 72 hours. Hemoglobin A1C: No results for input(s): HGBA1C in the last 72 hours. Fasting Lipid Panel: No results for input(s): CHOL, HDL, LDLCALC, TRIG, CHOLHDL, LDLDIRECT in the last 72 hours. Thyroid Function Tests: No results for input(s): TSH, T4TOTAL, T3FREE, THYROIDAB in  the last 72 hours.  Invalid input(s): FREET3 Anemia Panel: No results for input(s): VITAMINB12, FOLATE, FERRITIN, TIBC, IRON, RETICCTPCT in the last 72 hours. Coag Panel:   Lab Results  Component Value Date   INR 1.02 06/03/2014   INR 1.10 08/08/2013   INR 1.09 03/18/2013    RADIOLOGY: Dg Chest Port 1 View  07/09/2014   CLINICAL DATA:  79 year old male with chronic systolic heart failure and severe shortness of breath, chest and back pain beading earlier this morning  EXAM: PORTABLE CHEST - 1 VIEW  COMPARISON:  Prior chest x-ray 07/01/2014  FINDINGS: Stable position of left subclavian approach biventricular cardiac rhythm maintenance device. Leads project over the right atrium, right ventricle and in the cardiac vein overlying the left ventricle. Patient is status post median sternotomy with evidence of multivessel CABG including LIMA bypass. Atherosclerotic calcifications again noted in the transverse aorta. The cardiac and mediastinal contours remain stable. No overt pulmonary edema, pleural effusion, pneumothorax or focal airspace consolidation. Similar appearance of left basilar opacity likely reflecting a small pleural effusion and associated atelectasis. This is unchanged compared to 07/01/2014. Prominent right nipple shadow again noted. No acute osseous abnormality.  IMPRESSION: 1. Unchanged appearance of the chest compared to 07/01/2014. Persistent small left pleural effusion and associated left basilar  opacity favored to reflect pleural fluid with atelectasis. Superimposed infiltrate is difficult to exclude. 2. No evidence of acute pulmonary edema or CHF.   Electronically Signed   By: Jacqulynn Cadet M.D.   On: 07/09/2014 09:20   Dg Chest Port 1 View  07/01/2014   CLINICAL DATA:  Congestive heart failure. Chest pain. Atrial fibrillation.  EXAM: PORTABLE CHEST - 1 VIEW  COMPARISON:  06/27/2014  FINDINGS: Sequelae of prior CABG are again identified. ICD remains in place. Cardiac silhouette  remains enlarged. There is increased retrocardiac opacity in the left lung base with partial obscuration of the left hemidiaphragm. A small left pleural effusion is questioned. The right lung remains grossly clear. No pneumothorax is identified. Right upper quadrant abdominal surgical clips are noted.  IMPRESSION: Left basilar atelectasis versus infiltrate. Question small left pleural effusion.   Electronically Signed   By: Logan Bores   On: 07/01/2014 10:49   Dg Chest Port 1 View  06/28/2014   CLINICAL DATA:  Constant short of breath. Chest pain. Leg swelling.  EXAM: PORTABLE CHEST - 1 VIEW  COMPARISON:  Radiograph 06/03/2014  FINDINGS: Left-sided pacemaker overlies stable enlarged cardiac silhouette. No effusion, infiltrate, pneumothorax.  IMPRESSION: Cardiomegaly without acute findings .   Electronically Signed   By: Suzy Bouchard M.D.   On: 06/28/2014 00:00   Assessment:   1. Persistent atrial fibrillation with RVR. Not on anticoagulation with symptomatic anemia. Recent chart notes reviewed. Currently on Cardizem CD, Lopressor, and Lanoxin. Medical therapy titration has been limited by blood pressure. He has been seen by EP, felt to be poor candidate for antiarrhythmic therapy or anesthesia.  2. NSVT.  3. Ischemic cardiomyopathy with LVEF approximately 20%. Patient with [employer] biventricular ICD in place, followed by EP.  4. Demand ischemia, peak troponin I 0.82.  5. 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  6. Severe COPD.  7. Symptomatic anemia.  8. Failure to thrive.  9. Sacral decubitus.  10. Acute renal insufficiency, creatinine currently 0.9.   Plan/Discussion:    Complex patient - just met him this morning on rounds. Reviewed chart.  He was seen by Palliative care, their note is reviewed and appreciated. Plan at this time is evaluation by the case manager for outpatient hospice referral. Patient and wife preparing to go home, hopefully soon.  Continue current medical regimen.  Wife wants patient to go home.  We discussed today that Hospice can be set up at home but they will only be able to administer meds and treat pain but she will need to find caregivers for basic needs.  He will need home health for wound care.  Will have case management talk with family.     Sueanne Margarita, MD  07/13/2014  11:43 AM

## 2014-07-13 NOTE — Progress Notes (Signed)
Physical Therapy Treatment Patient Details Name: [name] MRN: [id number] DOB: [birth year] Today's Date: 07/13/2014    History of Present Illness [name] is a 79 y.o. male with a history of CAD, Ischemic Cardiomyopathy, Systolic CHF, COPD, Chronic Respiratory Failure , HTN , Hyperlipidemia, DM2, Remote history of Colon Cancer who presents to the ED with complaints of SOB and Chest pain . Shortness of breath was transient and improved after being placed on BiPAP in the ER. BP was 315 systolic HR 176-160. He was found to be anemic. On 12/30 patient moved to ICU secondary to Medical issues including c/o of chest pain, short of breath, Pt HR sustaining 150s-170s and elevated troponin.     PT Comments    Pt's mobility limited more this session than last by LE weakness. Pt ambulated 48' with RW before feeling that legs were "giving out" and having to sit. Required 5 min seated rest before being able to perform long arc quad on left. Worked on sit to stand transfers from various height surfaces for further strengthening. PT will continue to follow.    Follow Up Recommendations  CIR     Equipment Recommendations  None recommended by PT    Recommendations for Other Services Rehab consult     Precautions / Restrictions Precautions Precautions: Fall Precaution Comments: pt is generally weak and deconditioned.  Reports his Lt leg "gives out" Restrictions Weight Bearing Restrictions: No    Mobility  Bed Mobility               General bed mobility comments: pt sitting EOb upon PT arrival  Transfers Overall transfer level: Needs assistance Equipment used: Rolling walker (2 wheeled);2 person hand held assist Transfers: Sit to/from [employer] Transfers Sit to Stand: Mod assist;Max assist;From elevated surface;+2 physical assistance Stand pivot transfers: Mod assist;+2 physical assistance       General transfer comment: with bed significanly elevated, pt  required +2 mod A for sit to stand, though from low chair pt required +2 max A for sit to stand, then mod A to pivot, practiced 2x for strengthening  Ambulation/Gait Ambulation/Gait assistance: Min assist Ambulation Distance (Feet): 35 Feet Assistive device: Rolling walker (2 wheeled) Gait Pattern/deviations: Step-through pattern;Trunk flexed Gait velocity: decreased Gait velocity interpretation: Below normal speed for age/gender General Gait Details: began on 2L O2, increased to 3L when pt became dypneic, O2 sats 98% on O2. vc's for upright posture, pt with increased wt through UE's. After 35', pt's legs began to feel very weak and he felt that he could not walk any further. Chair brought to him. Took 5 mins of rest before pt could perform LAQ on left. After sufficient break was given, practiced transfers for strengthening   Stairs            Wheelchair Mobility    Modified Rankin (Stroke Patients Only)       Balance Overall balance assessment: Needs assistance Sitting-balance support: No upper extremity supported Sitting balance-Leahy Scale: Good     Standing balance support: Bilateral upper extremity supported Standing balance-Leahy Scale: Poor                      Cognition Arousal/Alertness: Awake/alert Behavior During Therapy: WFL for tasks assessed/performed Overall Cognitive Status: Within Functional Limits for tasks assessed                      Exercises      General Comments General comments (  skin integrity, edema, etc.): bilateral LE weakness limiting ambulation and other functional activity, discussed with pt and encouraged that rehab would be good option for him      Pertinent Vitals/Pain Pain Assessment: No/denies pain  O2 sats maintained at 98% on 2L O2 and then 3L with exertion HR 76 bpm    Home Living                      Prior Function            PT Goals (current goals can now be found in the care plan section)  Acute Rehab PT Goals Patient Stated Goal: to get stronger  PT Goal Formulation: With patient/family Time For Goal Achievement: 07/16/14 Potential to Achieve Goals: Good Progress towards PT goals: Not progressing toward goals - comment (due to weakness LE's after not ambulating several days)    Frequency  Min 3X/week    PT Plan Current plan remains appropriate    Co-evaluation             End of Session Equipment Utilized During Treatment: Gait belt;Oxygen Activity Tolerance: Patient limited by fatigue Patient left: in bed;with call bell/phone within reach;with family/visitor present     Time: 1208-1234 PT Time Calculation (min) (ACUTE ONLY): 26 min  Charges:  $Gait Training: 8-22 mins $Therapeutic Activity: 8-22 mins                    G Codes:     Leighton Roach, PT  Acute Rehab Services  [phone]  Leighton Roach 07/13/2014, 2:38 PM

## 2014-07-13 NOTE — Telephone Encounter (Signed)
Rx sent Will sign off

## 2014-07-13 NOTE — Progress Notes (Signed)
Occupational Therapy Treatment Patient Details Name: [name] MRN: [id number] DOB: [birth year] Today's Date: 07/13/2014    History of present illness [name] is a 79 y.o. male with a history of CAD, Ischemic Cardiomyopathy, Systolic CHF, COPD, Chronic Respiratory Failure , HTN , Hyperlipidemia, DM2, Remote history of Colon Cancer who presents to the ED with complaints of SOB and Chest pain . Shortness of breath was transient and improved after being placed on BiPAP in the ER. BP was 256 systolic HR 389-373. He was found to be anemic. On 12/30 patient moved to ICU secondary to Medical issues including c/o of chest pain, short of breath, Pt HR sustaining 150s-170s and elevated troponin.    OT comments  Noted documentation from pallative services, pt. And spouse still agreeable and interested in b ue there ex for this session.  Pt. Seated eob and able to complete with long rest breaks after each set of 10. C/o back pain and requested back to bed.  Main discussion throughout session "when can i go home, they are going to let me go home aren't they, i just want to be at home".  Provided encouragement and reviewed that it was currently being worked on for pt. And family.    Follow Up Recommendations  Home health OT;Supervision/Assistance - 24 hour    Equipment Recommendations  None recommended by OT    Recommendations for Other Services      Precautions / Restrictions Precautions Precautions: Fall Precaution Comments: pt is generally weak and deconditioned.  Reports his Lt leg "gives out" Restrictions Weight Bearing Restrictions: No                                                                                                                             Cognition   Behavior During Therapy: WFL for tasks assessed/performed Overall Cognitive Status: Within Functional Limits for tasks assessed                                       Exercises General Exercises - Upper Extremity Shoulder Flexion: AROM;Right;Left;10 reps;Seated Elbow Flexion: AROM;Both;10 reps;Seated Other Exercises Other Exercises: Pt performed 1 set 10 reps horizontal abduction with elbow extension and diagonals.  Pt fatigued afterward and requires 2 rest breaks           General Comments  eager to return home and be with his dog, buddy.  Wants home asap    Pertinent Vitals/ Pain       Pain Assessment:  (did not rate but c/o back pain) Pain Location: back Pain Descriptors / Indicators: Aching Pain Intervention(s): Repositioned  Home Living  Frequency Min 2X/week     Progress Toward Goals  OT Goals(current goals can now be found in the care plan section)  Progress towards OT goals: Progressing toward goals     Plan Discharge plan needs to be updated                     End of Session Equipment Utilized During Treatment: Oxygen   Activity Tolerance Patient limited by pain;Patient limited by fatigue   Patient Left in bed;with call bell/phone within reach;with family/visitor present   Nurse Communication          Time: 7494-4967 OT Time Calculation (min): 15 min  Charges: OT General Charges $OT Visit: 1 Procedure OT Treatments $Therapeutic Exercise: 8-22 mins  Janice Coffin, COTA/L 07/13/2014, 8:53 AM

## 2014-07-13 NOTE — Progress Notes (Signed)
Medicare Important Message given? YES  (If response is "NO", the following Medicare IM given date fields will be blank)  Date Medicare IM given: 07/13/14 Medicare IM given by:  Dahlia Client [employer]

## 2014-07-13 NOTE — Progress Notes (Signed)
Physical Therapy Wound Treatment Patient Details  Name: [name] MRN: [id number] Date of Birth: 13-Jun-1936  Today's Date: 07/13/2014 Time: 9219-2689 Time Calculation (min): 62 min  Subjective  Subjective: Just don't hurt me Patient and Family Stated Goals: get this bottom feeling better Date of Onset:  (unknown) Prior Treatments: many treatments over the year  Pain Score: Pain Score: 1  Pt complains of pain with static positioning.  Wound Assessment  Pressure Ulcer 06/28/14 Stage IV - Full thickness tissue loss with exposed bone, tendon or muscle. see WOC RN progress notes (80%red, 20% yellow slough,bone palpable)  (Active)  Dressing Type Moist to dry;ABD;Barrier Film (skin prep) 07/13/2014 11:08 AM  Dressing Clean;Dry;Intact 07/13/2014 11:08 AM  Dressing Change Frequency Twice a day 07/13/2014 11:08 AM  State of Healing Eschar 07/13/2014 11:08 AM  Site / Wound Assessment Granulation tissue;Yellow;Black;Red;Pink 07/13/2014 11:08 AM  % Wound base Red or Granulating 75% 07/13/2014 11:08 AM  % Wound base Yellow 0% 07/13/2014 11:08 AM  % Wound base Black 25% 07/13/2014 11:08 AM  % Wound base Other (Comment) 0% 07/13/2014 11:08 AM  Peri-wound Assessment Intact;Erythema (blanchable) 07/13/2014 11:08 AM  Wound Length (cm) 7.5 cm 07/11/2014 10:02 AM  Wound Width (cm) 6.25 cm 07/11/2014 10:02 AM  Wound Depth (cm) 4 cm 07/11/2014 10:02 AM  Tunneling (cm) 4 cm at 11* and 2* 07/13/2014 11:08 AM  Undermining (cm) 2 cm at 1:00; 1.5 cm at 12:00 07/11/2014 10:02 AM  Margins Unattached edges (unapproximated) 07/13/2014 11:08 AM  Drainage Amount Copious 07/13/2014 11:08 AM  Drainage Description Odor;Purulent 07/13/2014 11:08 AM  Treatment Debridement (Selective);Hydrotherapy (Pulse lavage);Cleansed;Other (Comment) 07/13/2014 11:08 AM     Wound / Incision (Open or Dehisced) Other (Comment) Arm Right;Lower;Lateral (Active)  Dressing Type Foam 07/13/2014  8:35 AM  Dressing Changed Changed 07/08/2014  9:00 PM   Dressing Status Clean;Dry;Intact 07/13/2014  8:35 AM  Dressing Change Frequency Every 5 days 07/08/2014  9:00 PM  Site / Wound Assessment Dressing in place / Unable to assess 07/09/2014  8:04 PM  Peri-wound Assessment Intact 07/08/2014  9:00 PM  Drainage Amount Scant 07/08/2014  9:00 PM  Drainage Description Serosanguineous 07/08/2014  9:00 PM  Treatment Cleansed 07/08/2014  9:00 PM     Wound / Incision (Open or Dehisced) 07/04/14 Other (Comment) Arm Right;Lateral skin tear from lab draw/tourniqet (Active)  Dressing Type Foam 07/13/2014  8:35 AM  Dressing Changed New 07/08/2014  9:00 PM  Dressing Status Clean;Dry;Intact 07/13/2014  8:35 AM  Dressing Change Frequency PRN 07/08/2014  9:00 PM  Site / Wound Assessment Dressing in place / Unable to assess 07/09/2014  8:04 PM  Margins Unattached edges (unapproximated) 07/08/2014  9:00 PM  Closure None 07/08/2014  9:00 PM  Drainage Amount Minimal 07/08/2014  9:00 PM  Drainage Description Sanguineous 07/08/2014  9:00 PM   Hydrotherapy Pulsed lavage therapy - wound location: sacrum Pulsed Lavage with Suction (psi): 8 psi (Variable btw 4-8 psi) Pulsed Lavage with Suction - Normal Saline Used: 1000 mL Pulsed Lavage Tip: Tip with splash shield Selective Debridement Selective Debridement - Location: sacrum Selective Debridement - Tools Used: Forceps;Scissors;Scalpel Selective Debridement - Tissue Removed: eschar, fibrin   Wound Assessment and Plan  Wound Therapy - Assess/Plan/Recommendations Wound Therapy - Clinical Statement: Pt can benefit from hydrotherapy and selective debridement to take this wound from chronic to more acute to promote progress toward healing. Wound Therapy - Functional Problem List: deconditioned Factors Delaying/Impairing Wound Healing: Altered sensation;Multiple medical problems Hydrotherapy Plan: Debridement;Dressing change;Patient/family education;Pulsatile lavage with  suction Wound Therapy - Frequency: 6X / week Wound Therapy - Current  Recommendations: PT;OT Wound Therapy - Follow Up Recommendations: Home health RN Wound Plan: see above  Wound Therapy Goals- Improve the function of patient's integumentary system by progressing the wound(s) through the phases of wound healing (inflammation - proliferation - remodeling) by: Decrease Necrotic Tissue to: 10 Decrease Necrotic Tissue - Progress: Progressing toward goal Increase Granulation Tissue to: 90 Increase Granulation Tissue - Progress: Progressing toward goal Decrease Length/Width/Depth by (cm): depth by 1 cm Improve Drainage Characteristics: Min Improve Drainage Characteristics - Progress: Not progressing Goals/treatment plan/discharge plan were made with and agreed upon by patient/family: Yes Time For Goal Achievement: 7 days Wound Therapy - Potential for Goals: Good  Goals will be updated until maximal potential achieved or discharge criteria met.  Discharge criteria: when goals achieved, discharge from hospital, MD decision/surgical intervention, no progress towards goals, refusal/missing three consecutive treatments without notification or medical reason.  GP     Candy Sledge A 07/13/2014, 11:19 AM  Candy Sledge, PT, DPT [phone]

## 2014-07-13 NOTE — Clinical Social Work Note (Signed)
CSW spoke with patients wife at bedside.  Pts wife reports that she is not interested in residential hospice placement and would like patient to be taken home as the patient has requested.  CSW informed RNCM of choice.  CSW signing off.  Domenica Reamer, [place] Social Worker [phone]

## 2014-07-13 NOTE — Telephone Encounter (Signed)
Potassium last refilled by Dr. Gwenette Greet 02/17/14 #90 x 1 refill Please advise on refill thanks

## 2014-07-13 NOTE — Telephone Encounter (Signed)
Ok to fill 

## 2014-07-14 DIAGNOSIS — I2510 Atherosclerotic heart disease of native coronary artery without angina pectoris: Secondary | ICD-10-CM

## 2014-07-14 LAB — GLUCOSE, CAPILLARY
GLUCOSE-CAPILLARY: 150 mg/dL — AB (ref 70–99)
GLUCOSE-CAPILLARY: 161 mg/dL — AB (ref 70–99)
GLUCOSE-CAPILLARY: 119 mg/dL — AB (ref 70–99)
Glucose-Capillary: 162 mg/dL — ABNORMAL HIGH (ref 70–99)

## 2014-07-14 MED ORDER — DIGOXIN 0.25 MG/ML IJ SOLN
0.2500 mg | Freq: Once | INTRAMUSCULAR | Status: AC
  Administered 2014-07-14: 0.25 mg via INTRAVENOUS
  Filled 2014-07-14: qty 1

## 2014-07-14 MED ORDER — AMIODARONE LOAD VIA INFUSION
150.0000 mg | Freq: Once | INTRAVENOUS | Status: DC
  Filled 2014-07-14: qty 83.34

## 2014-07-14 MED ORDER — AMIODARONE HCL IN DEXTROSE 360-4.14 MG/200ML-% IV SOLN
60.0000 mg/h | INTRAVENOUS | Status: AC
  Filled 2014-07-14: qty 200

## 2014-07-14 MED ORDER — AMIODARONE HCL IN DEXTROSE 360-4.14 MG/200ML-% IV SOLN
30.0000 mg/h | INTRAVENOUS | Status: DC
  Filled 2014-07-14 (×2): qty 200

## 2014-07-14 NOTE — Progress Notes (Signed)
Rate now controlled-ok to hold amiodarone drip per Cecilie Kicks NP

## 2014-07-14 NOTE — Progress Notes (Signed)
Called for rapid a fib up to 160, BP initially down to 78 systolic, and now 91 systolic.  Discussed with Dr. Radford Pax. Will try IV dig now if no improvement will add amiodarone drip, but bolus will be over 30 min.  We are aware of his lung issues but to control HR may need the amiodarone.  Will also hold pm dose of lasix.  Pt only complains of weakness.

## 2014-07-14 NOTE — Progress Notes (Signed)
Rapid HR on the monitor140-160s,EKG done Afib with RVR SBP 70-90s MD made aware.Cecilie Kicks came to see patient.

## 2014-07-14 NOTE — Progress Notes (Signed)
Physical Therapy Wound Treatment Patient Details  Name: [name] MRN: [id number] Date of Birth: 03/03/1936  Today's Date: 07/14/2014 Time: 0321-2248 Time Calculation (min): 45 min  Subjective  Subjective: Ouch! Patient and Family Stated Goals: get this bottom feeling better Date of Onset:  (unknown) Prior Treatments: many treatments over the year  Pain Score: Pain Score: 6  Pt needed to reposition multiple times during session.  Wound Assessment  Pressure Ulcer 06/28/14 Stage IV - Full thickness tissue loss with exposed bone, tendon or muscle. see [place] RN progress notes (80%red, 20% yellow slough,bone palpable)  (Active)  Dressing Type Moist to dry;ABD;Barrier Film (skin prep) 07/14/2014 10:38 AM  Dressing Clean;Dry;Intact 07/14/2014 10:38 AM  Dressing Change Frequency Twice a day 07/14/2014 10:38 AM  State of Healing Eschar 07/14/2014 10:38 AM  Site / Wound Assessment Granulation tissue;Yellow;Black;Red;Pink 07/14/2014 10:38 AM  % Wound base Red or Granulating 75% 07/14/2014 10:38 AM  % Wound base Yellow 0% 07/14/2014 10:38 AM  % Wound base Black 25% 07/14/2014 10:38 AM  % Wound base Other (Comment) 0% 07/14/2014 10:38 AM  Peri-wound Assessment Intact;Erythema (blanchable) 07/14/2014 10:38 AM  Wound Length (cm) 7.5 cm 07/11/2014 10:02 AM  Wound Width (cm) 6.25 cm 07/11/2014 10:02 AM  Wound Depth (cm) 4 cm 07/11/2014 10:02 AM  Tunneling (cm) 4 cm at 11* and 2* 07/13/2014 11:08 AM  Undermining (cm) 2 cm at 1:00; 1.5 cm at 12:00 07/11/2014 10:02 AM  Margins Unattached edges (unapproximated) 07/14/2014 10:38 AM  Drainage Amount Copious 07/14/2014 10:38 AM  Drainage Description Odor;Purulent 07/14/2014 10:38 AM  Treatment Cleansed;Debridement (Selective);Hydrotherapy (Pulse lavage) 07/14/2014 10:38 AM     Wound / Incision (Open or Dehisced) Other (Comment) Arm Right;Lower;Lateral (Active)  Dressing Type Foam 07/14/2014  9:34 AM  Dressing Changed Changed 07/08/2014  9:00 PM  Dressing Status  Clean;Dry;Intact 07/14/2014  9:34 AM  Dressing Change Frequency Every 5 days 07/08/2014  9:00 PM  Site / Wound Assessment Dressing in place / Unable to assess 07/09/2014  8:04 PM  Peri-wound Assessment Intact 07/08/2014  9:00 PM  Drainage Amount Scant 07/08/2014  9:00 PM  Drainage Description Serosanguineous 07/08/2014  9:00 PM  Treatment Cleansed 07/08/2014  9:00 PM     Wound / Incision (Open or Dehisced) 07/04/14 Other (Comment) Arm Right;Lateral skin tear from lab draw/tourniqet (Active)  Dressing Type Foam 07/14/2014  9:34 AM  Dressing Changed New 07/08/2014  9:00 PM  Dressing Status Clean;Dry;Intact 07/14/2014  9:34 AM  Dressing Change Frequency PRN 07/08/2014  9:00 PM  Site / Wound Assessment Dressing in place / Unable to assess 07/09/2014  8:04 PM  Margins Unattached edges (unapproximated) 07/08/2014  9:00 PM  Closure None 07/08/2014  9:00 PM  Drainage Amount Minimal 07/08/2014  9:00 PM  Drainage Description Sanguineous 07/08/2014  9:00 PM   Hydrotherapy Pulsed lavage therapy - wound location: sacrum Pulsed Lavage with Suction (psi): 8 psi (Variable btw 4-8 psi) Pulsed Lavage with Suction - Normal Saline Used: 1000 mL Pulsed Lavage Tip: Tip with splash shield Selective Debridement Selective Debridement - Location: sacrum Selective Debridement - Tools Used: Forceps;Scissors;Scalpel Selective Debridement - Tissue Removed: eschar, fibrin   Wound Assessment and Plan  Wound Therapy - Assess/Plan/Recommendations Wound Therapy - Clinical Statement: Pt can benefit from hydrotherapy and selective debridement to take this wound from chronic to more acute to promote progress toward healing. Wound Therapy - Functional Problem List: deconditioned Factors Delaying/Impairing Wound Healing: Altered sensation;Multiple medical problems Hydrotherapy Plan: Debridement;Dressing change;Patient/family education;Pulsatile lavage with suction Wound Therapy -  Frequency: 6X / week Wound Therapy - Current Recommendations:  PT;OT Wound Therapy - Follow Up Recommendations: Home health RN Wound Plan: see above  Wound Therapy Goals- Improve the function of patient's integumentary system by progressing the wound(s) through the phases of wound healing (inflammation - proliferation - remodeling) by: Decrease Necrotic Tissue to: 10 Decrease Necrotic Tissue - Progress: Progressing toward goal Increase Granulation Tissue to: 90 Increase Granulation Tissue - Progress: Progressing toward goal Decrease Length/Width/Depth by (cm): depth by 1 cm Decrease Length/Width/Depth - Progress: Discontinued (comment) (Deeper with debridement) Improve Drainage Characteristics: Min Improve Drainage Characteristics - Progress: Not progressing (More brown color and more odorous) Goals/treatment plan/discharge plan were made with and agreed upon by patient/family: Yes Time For Goal Achievement: 7 days Wound Therapy - Potential for Goals: Good  Goals will be updated until maximal potential achieved or discharge criteria met.  Discharge criteria: when goals achieved, discharge from hospital, MD decision/surgical intervention, no progress towards goals, refusal/missing three consecutive treatments without notification or medical reason.  GP     Candy Sledge A 07/14/2014, 10:46 AM  Candy Sledge, PT, DPT [phone]

## 2014-07-14 NOTE — Progress Notes (Signed)
SUBJECTIVE:  No compalints  OBJECTIVE:   Vitals:   Filed Vitals:   07/13/14 1435 07/13/14 1930 07/13/14 2101 07/14/14 0635  BP: 104/50  122/51 111/45  Pulse: 89 75 63 70  Temp: 98.9 F (37.2 C)  98.6 F (37 C) 98.1 F (36.7 C)  TempSrc: Oral  Oral Oral  Resp: 19 16 17 16   Height:      Weight:    149 lb 3.2 oz (67.677 kg)  SpO2: 100% 100% 100% 100%   I&O's:   Intake/Output Summary (Last 24 hours) at 07/14/14 0902 Last data filed at 07/14/14 0000  Gross per 24 hour  Intake    360 ml  Output    400 ml  Net    -40 ml   TELEMETRY: Reviewed telemetry pt in atrial fibrillation     PHYSICAL EXAM General: Well developed, well nourished, in no acute distress Head: Eyes PERRLA, No xanthomas.   Normal cephalic and atramatic  Lungs:   Clear bilaterally to auscultation and percussion. Heart:   HRRR S1 S2 Pulses are 2+ & equal. Abdomen: Bowel sounds are positive, abdomen soft and non-tender without masses  Extremities:   No clubbing, cyanosis or edema.  DP +1 Neuro: Alert and oriented X 3. Psych:  Good affect, responds appropriately   LABS: Basic Metabolic Panel:  Recent Labs  07/13/14 0316  NA 135  K 3.8  CL 94*  CO2 34*  GLUCOSE 123*  BUN 31*  CREATININE 1.05  CALCIUM 8.0*  MG 2.0   Liver Function Tests: No results for input(s): AST, ALT, ALKPHOS, BILITOT, PROT, ALBUMIN in the last 72 hours. No results for input(s): LIPASE, AMYLASE in the last 72 hours. CBC: No results for input(s): WBC, NEUTROABS, HGB, HCT, MCV, PLT in the last 72 hours. Cardiac Enzymes: No results for input(s): CKTOTAL, CKMB, CKMBINDEX, TROPONINI in the last 72 hours. BNP: Invalid input(s): POCBNP D-Dimer: No results for input(s): DDIMER in the last 72 hours. Hemoglobin A1C: No results for input(s): HGBA1C in the last 72 hours. Fasting Lipid Panel: No results for input(s): CHOL, HDL, LDLCALC, TRIG, CHOLHDL, LDLDIRECT in the last 72 hours. Thyroid Function Tests: No results for  input(s): TSH, T4TOTAL, T3FREE, THYROIDAB in the last 72 hours.  Invalid input(s): FREET3 Anemia Panel: No results for input(s): VITAMINB12, FOLATE, FERRITIN, TIBC, IRON, RETICCTPCT in the last 72 hours. Coag Panel:   Lab Results  Component Value Date   INR 1.02 06/03/2014   INR 1.10 08/08/2013   INR 1.09 03/18/2013    RADIOLOGY: Dg Chest Port 1 View  07/09/2014   CLINICAL DATA:  79 year old male with chronic systolic heart failure and severe shortness of breath, chest and back pain beading earlier this morning  EXAM: PORTABLE CHEST - 1 VIEW  COMPARISON:  Prior chest x-ray 07/01/2014  FINDINGS: Stable position of left subclavian approach biventricular cardiac rhythm maintenance device. Leads project over the right atrium, right ventricle and in the cardiac vein overlying the left ventricle. Patient is status post median sternotomy with evidence of multivessel CABG including LIMA bypass. Atherosclerotic calcifications again noted in the transverse aorta. The cardiac and mediastinal contours remain stable. No overt pulmonary edema, pleural effusion, pneumothorax or focal airspace consolidation. Similar appearance of left basilar opacity likely reflecting a small pleural effusion and associated atelectasis. This is unchanged compared to 07/01/2014. Prominent right nipple shadow again noted. No acute osseous abnormality.  IMPRESSION: 1. Unchanged appearance of the chest compared to 07/01/2014. Persistent small left pleural effusion and associated  left basilar opacity favored to reflect pleural fluid with atelectasis. Superimposed infiltrate is difficult to exclude. 2. No evidence of acute pulmonary edema or CHF.   Electronically Signed   By: Jacqulynn Cadet M.D.   On: 07/09/2014 09:20   Dg Chest Port 1 View  07/01/2014   CLINICAL DATA:  Congestive heart failure. Chest pain. Atrial fibrillation.  EXAM: PORTABLE CHEST - 1 VIEW  COMPARISON:  06/27/2014  FINDINGS: Sequelae of prior CABG are again  identified. ICD remains in place. Cardiac silhouette remains enlarged. There is increased retrocardiac opacity in the left lung base with partial obscuration of the left hemidiaphragm. A small left pleural effusion is questioned. The right lung remains grossly clear. No pneumothorax is identified. Right upper quadrant abdominal surgical clips are noted.  IMPRESSION: Left basilar atelectasis versus infiltrate. Question small left pleural effusion.   Electronically Signed   By: Logan Bores   On: 07/01/2014 10:49   Dg Chest Port 1 View  06/28/2014   CLINICAL DATA:  Constant short of breath. Chest pain. Leg swelling.  EXAM: PORTABLE CHEST - 1 VIEW  COMPARISON:  Radiograph 06/03/2014  FINDINGS: Left-sided pacemaker overlies stable enlarged cardiac silhouette. No effusion, infiltrate, pneumothorax.  IMPRESSION: Cardiomegaly without acute findings .   Electronically Signed   By: Suzy Bouchard M.D.   On: 06/28/2014 00:00   Assessment:   1. Persistent atrial fibrillation with RVR. Not on anticoagulation with symptomatic anemia. Recent chart notes reviewed. Currently on Cardizem CD, Lopressor, and Lanoxin. Medical therapy titration has been limited by blood pressure. He has been seen by EP, felt to be poor candidate for antiarrhythmic therapy or anesthesia.  2. NSVT.  3. Ischemic cardiomyopathy with LVEF approximately 20%. Patient with [employer] biventricular ICD in place, followed by EP.  4. Demand ischemia, peak troponin I 0.82.  5. 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  6. Severe COPD.  7. Symptomatic anemia.  8. Failure to thrive.  9. Sacral decubitus.  10. Acute renal insufficiency, creatinine currently 0.9.   Plan/Discussion:    Complex patient - He was seen by Palliative care, their note is reviewed and appreciated. Plan at this time is send patient home tomorrow with [facility] and wound care as well as PT.  If he does not progress then wife will get in home  Hospice care.     Sueanne Margarita, MD  07/14/2014  9:02 AM

## 2014-07-15 LAB — BASIC METABOLIC PANEL
ANION GAP: 6 (ref 5–15)
BUN: 25 mg/dL — ABNORMAL HIGH (ref 6–23)
CALCIUM: 8.3 mg/dL — AB (ref 8.4–10.5)
CO2: 33 mmol/L — ABNORMAL HIGH (ref 19–32)
CREATININE: 1 mg/dL (ref 0.50–1.35)
Chloride: 91 mEq/L — ABNORMAL LOW (ref 96–112)
GFR calc Af Amer: 82 mL/min — ABNORMAL LOW (ref >90)
GFR calc non Af Amer: 71 mL/min — ABNORMAL LOW (ref >90)
GLUCOSE: 160 mg/dL — AB (ref 70–99)
Potassium: 4.3 mmol/L (ref 3.5–5.1)
Sodium: 130 mmol/L — ABNORMAL LOW (ref 135–145)

## 2014-07-15 LAB — CBC
HCT: 26.6 % — ABNORMAL LOW (ref 39.0–52.0)
Hemoglobin: 8.3 g/dL — ABNORMAL LOW (ref 13.0–17.0)
MCH: 30.1 pg (ref 26.0–34.0)
MCHC: 31.2 g/dL (ref 30.0–36.0)
MCV: 96.4 fL (ref 78.0–100.0)
Platelets: 335 10*3/uL (ref 150–400)
RBC: 2.76 MIL/uL — AB (ref 4.22–5.81)
RDW: 15.6 % — ABNORMAL HIGH (ref 11.5–15.5)
WBC: 11.8 10*3/uL — ABNORMAL HIGH (ref 4.0–10.5)

## 2014-07-15 LAB — GLUCOSE, CAPILLARY
GLUCOSE-CAPILLARY: 107 mg/dL — AB (ref 70–99)
Glucose-Capillary: 168 mg/dL — ABNORMAL HIGH (ref 70–99)
Glucose-Capillary: 168 mg/dL — ABNORMAL HIGH (ref 70–99)

## 2014-07-15 NOTE — Progress Notes (Signed)
Physical Therapy Wound Treatment Patient Details  Name: [name] MRN: [id number] Date of Birth: 1935-11-14  Today's Date: 07/15/2014 Time:  - 4132-4401     Subjective  Subjective: "I can take this!" Patient and Family Stated Goals: get this bottom feeling better Date of Onset:  (unknown) Prior Treatments: many treatments over the year  Pain Score: Pain Score: 6   Wound Assessment  Pressure Ulcer 06/28/14 Stage IV - Full thickness tissue loss with exposed bone, tendon or muscle. see [place] RN progress notes (80%red, 20% yellow slough,bone palpable)  (Active)  Dressing Type Moist to dry;ABD;Barrier Film (skin prep) 07/15/2014 11:01 AM  Dressing Clean;Dry;Intact 07/15/2014 11:01 AM  Dressing Change Frequency Twice a day 07/15/2014 11:01 AM  State of Healing Eschar 07/15/2014 11:01 AM  Site / Wound Assessment Granulation tissue;Yellow;Red;Pink;Bleeding 07/15/2014 11:01 AM  % Wound base Red or Granulating 90% 07/15/2014 11:01 AM  % Wound base Yellow 10% 07/15/2014 11:01 AM  % Wound base Black 0% 07/15/2014 11:01 AM  % Wound base Other (Comment) 0% 07/15/2014 11:01 AM  Peri-wound Assessment Intact;Erythema (blanchable) 07/15/2014 11:01 AM  Wound Length (cm) 7.5 cm 07/11/2014 10:02 AM  Wound Width (cm) 6.25 cm 07/11/2014 10:02 AM  Wound Depth (cm) 4 cm 07/11/2014 10:02 AM  Tunneling (cm) 4 cm at 11* and 2* 07/13/2014 11:08 AM  Undermining (cm) 2 cm at 1:00; 1.5 cm at 12:00 07/11/2014 10:02 AM  Margins Unattached edges (unapproximated) 07/15/2014 11:01 AM  Drainage Amount Moderate 07/15/2014 11:01 AM  Drainage Description No odor;Serosanguineous 07/15/2014 11:01 AM  Treatment Cleansed;Debridement (Selective);Hydrotherapy (Pulse lavage) 07/15/2014 11:01 AM     Wound / Incision (Open or Dehisced) Other (Comment) Arm Right;Lower;Lateral (Active)  Dressing Type Foam 07/15/2014 10:41 AM  Dressing Changed Changed 07/08/2014  9:00 PM  Dressing Status Clean;Dry;Intact 07/15/2014 10:41 AM  Dressing Change Frequency  Every 5 days 07/08/2014  9:00 PM  Site / Wound Assessment Dressing in place / Unable to assess 07/09/2014  8:04 PM  Peri-wound Assessment Intact 07/08/2014  9:00 PM  Drainage Amount Scant 07/08/2014  9:00 PM  Drainage Description Serosanguineous 07/08/2014  9:00 PM  Treatment Cleansed 07/08/2014  9:00 PM     Wound / Incision (Open or Dehisced) 07/04/14 Other (Comment) Arm Right;Lateral skin tear from lab draw/tourniqet (Active)  Dressing Type Foam 07/15/2014 10:41 AM  Dressing Changed New 07/08/2014  9:00 PM  Dressing Status Clean;Dry;Intact 07/15/2014 10:41 AM  Dressing Change Frequency PRN 07/08/2014  9:00 PM  Site / Wound Assessment Dressing in place / Unable to assess 07/09/2014  8:04 PM  Margins Unattached edges (unapproximated) 07/08/2014  9:00 PM  Closure None 07/08/2014  9:00 PM  Drainage Amount Minimal 07/08/2014  9:00 PM  Drainage Description Sanguineous 07/08/2014  9:00 PM   Hydrotherapy Pulsed lavage therapy - wound location: sacrum Pulsed Lavage with Suction (psi): 8 psi (Variable btw 4-8 psi) Pulsed Lavage with Suction - Normal Saline Used: 1000 mL Pulsed Lavage Tip: Tip with splash shield Selective Debridement Selective Debridement - Location: sacrum Selective Debridement - Tools Used: Forceps;Scissors;Scalpel Selective Debridement - Tissue Removed: eschar, fibrin   Wound Assessment and Plan  Wound Therapy - Assess/Plan/Recommendations Wound Therapy - Clinical Statement: Pt can benefit from hydrotherapy and selective debridement to take this wound from chronic to more acute to promote progress toward healing. Wound Therapy - Functional Problem List: deconditioned Factors Delaying/Impairing Wound Healing: Altered sensation;Multiple medical problems Hydrotherapy Plan: Debridement;Dressing change;Patient/family education;Pulsatile lavage with suction Wound Therapy - Frequency: 6X / week Wound Therapy -  Current Recommendations: PT;OT Wound Therapy - Follow Up Recommendations: Home health RN Wound  Plan: see above  Wound Therapy Goals- Improve the function of patient's integumentary system by progressing the wound(s) through the phases of wound healing (inflammation - proliferation - remodeling) by: Decrease Necrotic Tissue to: 10 Decrease Necrotic Tissue - Progress: Partly met Increase Granulation Tissue to: 90 Increase Granulation Tissue - Progress: Partly met Decrease Length/Width/Depth by (cm): depth by 1 cm Decrease Length/Width/Depth - Progress: Discontinued (comment) Improve Drainage Characteristics: Min Improve Drainage Characteristics - Progress: Progressing toward goal Goals/treatment plan/discharge plan were made with and agreed upon by patient/family: Yes Time For Goal Achievement: 7 days Wound Therapy - Potential for Goals: Good  Goals will be updated until maximal potential achieved or discharge criteria met.  Discharge criteria: when goals achieved, discharge from hospital, MD decision/surgical intervention, no progress towards goals, refusal/missing three consecutive treatments without notification or medical reason.  GP     Candy Sledge A 07/15/2014, 11:08 AM  Candy Sledge, PT, DPT [phone]

## 2014-07-15 NOTE — Progress Notes (Signed)
Occupational Therapy Treatment Patient Details Name: [name] MRN: [id number] DOB: 15-Dec-1935 Today's Date: 07/15/2014    History of present illness [name] is a 79 y.o. male with a history of CAD, Ischemic Cardiomyopathy, Systolic CHF, COPD, Chronic Respiratory Failure , HTN , Hyperlipidemia, DM2, Remote history of Colon Cancer who presents to the ED with complaints of SOB and Chest pain . Shortness of breath was transient and improved after being placed on BiPAP in the ER. BP was 160 systolic HR 737-106. He was found to be anemic. On 12/30 patient moved to ICU secondary to Medical issues including c/o of chest pain, short of breath, Pt HR sustaining 150s-170s and elevated troponin.    OT comments  Pt remains highly motivated, asking for UE exercises that he could do independently and for OT to come daily.  Pt adamant that he was not going to leave the hospital until he could walk.   Follow Up Recommendations  CIR (SNF if CIR denies)    Equipment Recommendations  None recommended by OT    Recommendations for Other Services      Precautions / Restrictions Precautions Precautions: Fall Precaution Comments: knees buckle when pt becomes fatigued Restrictions Weight Bearing Restrictions: No       Mobility Bed Mobility        General bed mobility comments: pt sitting EOB finishing lunch upon OTs arrival  Transfers         Balance     Sitting balance-Leahy Scale: Good                             ADL Overall ADL's : Needs assistance/impaired     Grooming: Oral care;Sitting;Wash/dry hands;Wash/dry face;Set up                                 General ADL Comments: Pt stating he would do anything I wanted except walk.      Vision                     Perception     Praxis      Cognition   Behavior During Therapy: [facility] for tasks assessed/performed Overall Cognitive Status: Within Functional Limits for tasks  assessed                       Extremity/Trunk Assessment               Exercises General Exercises - Upper Extremity Shoulder Flexion: Strengthening;Both;10 reps;Seated;Theraband Theraband Level (Shoulder Flexion): Level 2 (Red) Shoulder Extension: Strengthening;Both;10 reps;Seated;Theraband Theraband Level (Shoulder Extension): Level 2 (Red) Shoulder Horizontal ABduction: Strengthening;Both;10 reps;Seated;Theraband Theraband Level (Shoulder Horizontal Abduction): Level 2 (Red) Elbow Flexion: Strengthening;Both;10 reps;Seated;Theraband Theraband Level (Elbow Flexion): Level 2 (Red) Elbow Extension: Strengthening;Both;10 reps;Supine;Theraband Theraband Level (Elbow Extension): Level 2 (Red)    Shoulder Instructions       General Comments      Pertinent Vitals/ Pain       No pain reported Home Living                                          Prior Functioning/Environment              Frequency Min 2X/week  Progress Toward Goals  OT Goals(current goals can now be found in the care plan section)  Progress towards OT goals: Progressing toward goals  Acute Rehab OT Goals Patient Stated Goal: to get stronger  Time For Goal Achievement: 07/21/14  Plan      Co-evaluation                 End of Session Equipment Utilized During Treatment: Oxygen   Activity Tolerance Patient limited by fatigue   Patient Left in bed;with call bell/phone within reach;with family/visitor present   Nurse Communication          Time: 1455-1520 OT Time Calculation (min): 25 min  Charges: OT General Charges $OT Visit: 1 Procedure OT Treatments $Self Care/Home Management : 8-22 mins $Therapeutic Exercise: 8-22 mins  Malka So 07/15/2014, 4:03 PM  [phone]

## 2014-07-15 NOTE — Progress Notes (Signed)
Physical Therapy Treatment Patient Details Name: [name] MRN: [id number] DOB: 08/31/1935 Today's Date: 07/15/2014    History of Present Illness [name] is a 79 y.o. male with a history of CAD, Ischemic Cardiomyopathy, Systolic CHF, COPD, Chronic Respiratory Failure , HTN , Hyperlipidemia, DM2, Remote history of Colon Cancer who presents to the ED with complaints of SOB and Chest pain . Shortness of breath was transient and improved after being placed on BiPAP in the ER. BP was 034 systolic HR 742-595. He was found to be anemic. On 12/30 patient moved to ICU secondary to Medical issues including c/o of chest pain, short of breath, Pt HR sustaining 150s-170s and elevated troponin.     PT Comments    Pt progressing slowly towards physical therapy goals. Able to ambulate a further distance today up to 55 feet with min assist, however towards end of distance, patient's legs buckled and required total assist to prevent fall. He was able to verbalize that he was becoming fatigued and needed to sit, however pt could not turn in time to sit down safely. SpO2 down to 92% on room air, back to 95% with rest, and 99% on 2L supplemental O2. Patient will continue to benefit from skilled physical therapy services to further improve independence with functional mobility.   Follow Up Recommendations  CIR (If CIR denies, then SNF, if refuses SNF maximize HHPT)     Equipment Recommendations  None recommended by PT    Recommendations for Other Services Rehab consult     Precautions / Restrictions Precautions Precautions: Fall Precaution Comments: knees buckle when pt becomes fatigued Restrictions Weight Bearing Restrictions: No    Mobility  Bed Mobility Overal bed mobility: Needs Assistance Bed Mobility: Supine to Sit   Sidelying to sit: Min guard       General bed mobility comments: Min guard for safety. VC for bringing LEs off bed. Cues to lift bottom when coming to edge of bed  to reduce friction with ues of UEs.  Transfers Overall transfer level: Needs assistance Equipment used: Rolling walker (2 wheeled) Transfers: Sit to/from Stand Sit to Stand: Mod assist;From elevated surface         General transfer comment: Mod assist for boost to stand. Required additonal elevation from hospital bed after failed first attempt due to LE weakness. VC for technique and hand placement.  Ambulation/Gait Ambulation/Gait assistance: Total assist;Min assist Ambulation Distance (Feet): 55 Feet Assistive device: Rolling walker (2 wheeled) Gait Pattern/deviations: Step-through pattern;Decreased stride length;Trunk flexed Gait velocity: decreased Gait velocity interpretation: Below normal speed for age/gender General Gait Details: Ambulating on room air with SpO2 at 92%, returning to 95% after seated rest break. VC for upright posture which pt has great difficulty correcting. Wanted to ambulate further distance today, became very fatigued towards end of bout stating legs were "giving out." Pt did buckle requring total assist to prevent fall. Reports he fatigues rapidly and this has occured at home as well. Min assist for walker control for majority of bout.   Stairs            Wheelchair Mobility    Modified Rankin (Stroke Patients Only)       Balance                                    Cognition Arousal/Alertness: Awake/alert Behavior During Therapy: WFL for tasks assessed/performed Overall Cognitive Status: Within Functional  Limits for tasks assessed                      Exercises General Exercises - Lower Extremity Ankle Circles/Pumps: AROM;Both;10 reps;Seated Long Arc Quad: Strengthening;Both;10 reps;Seated Hip Flexion/Marching: Strengthening;Both;10 reps;Seated    General Comments General comments (skin integrity, edema, etc.): BIL LE buckling with fatigue, high fall risk      Pertinent Vitals/Pain Pain Assessment: 0-10 Pain  Score:  ("doing okay, had a rough time with hydro today" no value.) Pain Location: sacrum Pain Intervention(s): Monitored during session;Repositioned    Home Living                      Prior Function            PT Goals (current goals can now be found in the care plan section) Acute Rehab PT Goals Patient Stated Goal: to get stronger  PT Goal Formulation: With patient/family Time For Goal Achievement: 07/29/14 Potential to Achieve Goals: Fair Progress towards PT goals: Progressing toward goals    Frequency  Min 3X/week    PT Plan Current plan remains appropriate    Co-evaluation             End of Session Equipment Utilized During Treatment: Gait belt Activity Tolerance: Patient limited by fatigue Patient left: with call bell/phone within reach;with family/visitor present;in chair     Time: 2694-8546 PT Time Calculation (min) (ACUTE ONLY): 25 min  Charges:  $Gait Training: 8-22 mins $Therapeutic Activity: 8-22 mins                    G Codes:      Ellouise Newer 07/26/2014, 1:46 PM Camille Bal [place], [place]

## 2014-07-15 NOTE — Progress Notes (Signed)
SUBJECTIVE:  No complaints - wants to go home  OBJECTIVE:   Vitals:   Filed Vitals:   07/14/14 2049 07/14/14 2207 07/14/14 2300 07/15/14 0541  BP:  94/44 113/44 106/63  Pulse:  76 80 72  Temp:  98.2 F (36.8 C)  98.2 F (36.8 C)  TempSrc:  Oral  Oral  Resp:  18  18  Height:      Weight:    143 lb 15.4 oz (65.3 kg)  SpO2: 100% 100%  100%   I&O's:   Intake/Output Summary (Last 24 hours) at 07/15/14 0747 Last data filed at 07/15/14 0544  Gross per 24 hour  Intake    270 ml  Output   1200 ml  Net   -930 ml   TELEMETRY: Reviewed telemetry pt in atrial fibrillation at 108bpm     PHYSICAL EXAM General: Well developed, well nourished, in no acute distress Head: Eyes PERRLA, No xanthomas.   Normal cephalic and atramatic  Lungs:   Clear bilaterally to auscultation and percussion. Heart:   Irregularly irregular S1 S2 Pulses are 2+ & equal. Abdomen: Bowel sounds are positive, abdomen soft and non-tender without masses Extremities:   No clubbing, cyanosis or edema.  DP +1 Neuro: Alert and oriented X 3. Psych:  Good affect, responds appropriately   LABS: Basic Metabolic Panel:  Recent Labs  07/13/14 0316  NA 135  K 3.8  CL 94*  CO2 34*  GLUCOSE 123*  BUN 31*  CREATININE 1.05  CALCIUM 8.0*  MG 2.0   Liver Function Tests: No results for input(s): AST, ALT, ALKPHOS, BILITOT, PROT, ALBUMIN in the last 72 hours. No results for input(s): LIPASE, AMYLASE in the last 72 hours. CBC:  Recent Labs  07/15/14 0322  WBC 11.8*  HGB 8.3*  HCT 26.6*  MCV 96.4  PLT 335   Cardiac Enzymes: No results for input(s): CKTOTAL, CKMB, CKMBINDEX, TROPONINI in the last 72 hours. BNP: Invalid input(s): POCBNP D-Dimer: No results for input(s): DDIMER in the last 72 hours. Hemoglobin A1C: No results for input(s): HGBA1C in the last 72 hours. Fasting Lipid Panel: No results for input(s): CHOL, HDL, LDLCALC, TRIG, CHOLHDL, LDLDIRECT in the last 72 hours. Thyroid Function  Tests: No results for input(s): TSH, T4TOTAL, T3FREE, THYROIDAB in the last 72 hours.  Invalid input(s): FREET3 Anemia Panel: No results for input(s): VITAMINB12, FOLATE, FERRITIN, TIBC, IRON, RETICCTPCT in the last 72 hours. Coag Panel:   Lab Results  Component Value Date   INR 1.02 06/03/2014   INR 1.10 08/08/2013   INR 1.09 03/18/2013    RADIOLOGY: Dg Chest Port 1 View  07/09/2014   CLINICAL DATA:  79 year old male with chronic systolic heart failure and severe shortness of breath, chest and back pain beading earlier this morning  EXAM: PORTABLE CHEST - 1 VIEW  COMPARISON:  Prior chest x-ray 07/01/2014  FINDINGS: Stable position of left subclavian approach biventricular cardiac rhythm maintenance device. Leads project over the right atrium, right ventricle and in the cardiac vein overlying the left ventricle. Patient is status post median sternotomy with evidence of multivessel CABG including LIMA bypass. Atherosclerotic calcifications again noted in the transverse aorta. The cardiac and mediastinal contours remain stable. No overt pulmonary edema, pleural effusion, pneumothorax or focal airspace consolidation. Similar appearance of left basilar opacity likely reflecting a small pleural effusion and associated atelectasis. This is unchanged compared to 07/01/2014. Prominent right nipple shadow again noted. No acute osseous abnormality.  IMPRESSION: 1. Unchanged appearance of the chest  compared to 07/01/2014. Persistent small left pleural effusion and associated left basilar opacity favored to reflect pleural fluid with atelectasis. Superimposed infiltrate is difficult to exclude. 2. No evidence of acute pulmonary edema or CHF.   Electronically Signed   By: Jacqulynn Cadet M.D.   On: 07/09/2014 09:20   Dg Chest Port 1 View  07/01/2014   CLINICAL DATA:  Congestive heart failure. Chest pain. Atrial fibrillation.  EXAM: PORTABLE CHEST - 1 VIEW  COMPARISON:  06/27/2014  FINDINGS: Sequelae of prior  CABG are again identified. ICD remains in place. Cardiac silhouette remains enlarged. There is increased retrocardiac opacity in the left lung base with partial obscuration of the left hemidiaphragm. A small left pleural effusion is questioned. The right lung remains grossly clear. No pneumothorax is identified. Right upper quadrant abdominal surgical clips are noted.  IMPRESSION: Left basilar atelectasis versus infiltrate. Question small left pleural effusion.   Electronically Signed   By: Logan Bores   On: 07/01/2014 10:49   Dg Chest Port 1 View  06/28/2014   CLINICAL DATA:  Constant short of breath. Chest pain. Leg swelling.  EXAM: PORTABLE CHEST - 1 VIEW  COMPARISON:  Radiograph 06/03/2014  FINDINGS: Left-sided pacemaker overlies stable enlarged cardiac silhouette. No effusion, infiltrate, pneumothorax.  IMPRESSION: Cardiomegaly without acute findings .   Electronically Signed   By: Suzy Bouchard M.D.   On: 06/28/2014 00:00    Assessment:   1. Persistent atrial fibrillation with RVR. Not on anticoagulation with symptomatic anemia. Recent chart notes reviewed. Currently on Cardizem CD, Lopressor, and Lanoxin. Medical therapy titration has been limited by blood pressure. He has been seen by EP, felt to be poor candidate for antiarrhythmic therapy or anesthesia. Had an episode of rapid afib yesterday with hypotension but now HR in the low 100's after IV dig.    2. NSVT.  3. Ischemic cardiomyopathy with LVEF approximately 20%. Patient with [employer] biventricular ICD in place, followed by EP.  4. Demand ischemia, peak troponin I 0.82.  5. 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  6. Severe COPD.  7. Symptomatic anemia.  8. Failure to thrive.  9. Sacral decubitus.  10. Acute renal insufficiency, creatinine currently 0.9.  11.  Hypotension secondary to rapid afib.  Improved but still soft.  Plan/Discussion:    Complex patient - He was seen by  Palliative care, their note is reviewed and appreciated. Plan at this time is send patient home with [facility] and wound care as well as PT. If he does not progress then wife will get in home Hospice care. He wants to go home today.  Will see how his BP and HR do today.  Currently BP soft so not sure if we will be able to continue lopressor and cardizem to control HR.  Possible d/c home later today or tomorrow   Sueanne Margarita, MD  07/15/2014  7:47 AM

## 2014-07-16 ENCOUNTER — Other Ambulatory Visit: Payer: Self-pay

## 2014-07-16 LAB — CBC
HCT: 23.3 % — ABNORMAL LOW (ref 39.0–52.0)
Hemoglobin: 7.6 g/dL — ABNORMAL LOW (ref 13.0–17.0)
MCH: 30.3 pg (ref 26.0–34.0)
MCHC: 32.6 g/dL (ref 30.0–36.0)
MCV: 92.8 fL (ref 78.0–100.0)
PLATELETS: 263 10*3/uL (ref 150–400)
RBC: 2.51 MIL/uL — AB (ref 4.22–5.81)
RDW: 15.6 % — ABNORMAL HIGH (ref 11.5–15.5)
WBC: 7.8 10*3/uL (ref 4.0–10.5)

## 2014-07-16 LAB — GLUCOSE, CAPILLARY
GLUCOSE-CAPILLARY: 149 mg/dL — AB (ref 70–99)
GLUCOSE-CAPILLARY: 203 mg/dL — AB (ref 70–99)
Glucose-Capillary: 103 mg/dL — ABNORMAL HIGH (ref 70–99)
Glucose-Capillary: 165 mg/dL — ABNORMAL HIGH (ref 70–99)

## 2014-07-16 LAB — OCCULT BLOOD X 1 CARD TO LAB, STOOL: Fecal Occult Bld: NEGATIVE

## 2014-07-16 LAB — PREPARE RBC (CROSSMATCH)

## 2014-07-16 MED ORDER — POLYETHYLENE GLYCOL 3350 17 G PO PACK
17.0000 g | PACK | Freq: Two times a day (BID) | ORAL | Status: DC
  Administered 2014-07-16 – 2014-07-17 (×3): 17 g via ORAL
  Filled 2014-07-16 (×4): qty 1

## 2014-07-16 MED ORDER — SODIUM CHLORIDE 0.9 % IV SOLN: Freq: Once | INTRAVENOUS | Status: DC

## 2014-07-16 NOTE — Progress Notes (Signed)
Medicare Important Message given? YES  (If response is "NO", the following Medicare IM given date fields will be blank)  Date Medicare IM given: 07/16/14 Medicare IM given by:  Bhavik Cabiness  

## 2014-07-16 NOTE — Progress Notes (Signed)
Pt began c/o not feeling well and [employer] was calling to say that pt HR elevated and was reading V-fib/V-tach. Pt HR in the 130's to 140's and was V-paced. Pt had prn IV lopressor ordered, which was given. 12-lead EKG was also obtained. Pt began to feel better after IV lopressor. Day shift arriving and in report, pt Hgb was noted to have dropped to 7.6.  Day shift RN stated she will notify MD.

## 2014-07-16 NOTE — Progress Notes (Signed)
OT Cancellation Note  Patient Details Name: [name] MRN: [id number] DOB: 1936-01-17   Cancelled Treatment:    Reason Eval/Treat Not Completed: Fatigue/lethargy limiting ability to participate. Pt with elevated HR this morning.  Will continue to follow.  Malka So 07/16/2014, 12:54 PM

## 2014-07-16 NOTE — Progress Notes (Signed)
PT Cancellation Note  Patient Details Name: [name] MRN: [id number] DOB: 1936-05-28   Cancelled Treatment:    Reason Eval/Treat Not Completed: Medical issues which prohibited therapy.  Unable to breathe well and feels unable to tolerate treatment today.       [name], Tessie Fass 07/16/2014, 12:59 PM  07/16/2014  [name], [name] [phone]  (pager)

## 2014-07-16 NOTE — Consult Note (Signed)
Referring Provider: Dr. Fransico Him Primary Care Physician:  Dorian Heckle, MD Primary Gastroenterologist:  Dr. Wynetta Emery  Reason for Consultation:  Anemia  HPI: [name] is a 79 y.o. male  With advanced cardiomyopathy and COPD, seen by her service approximately 3 weeks ago at which time he underwent endoscopic evaluation for melenic stool. His endoscopy was normal.   At this time he is on pantoprazole 40 twice a day and is not on any aspirin or anticoagulants, although he has been in the past.   His wife reported dark stools (he was on iron prior to admission) and it was noted that there has been a decline in his hemoglobin, to a level of 7.6 today, prompting [place] to be consulted on him again. His stool has not been tested for Hemoccult status on this admission, although he was positive when in the hospital about 3 weeks ago.  His current hemoglobin of 7.6 compares to 8.3 yesterday and 9.0 a couple of weeks ago, although it appears that his baseline for the past couple of weeks has been around 8.2.   The patient does not have diarrhea or melena.  On the contrary, he suffers from chronic outlet dysfunction constipation, and at home, requires a manual disimpaction every couple of days.   There is a past history of a sigmoid colectomy for colon cancer in 2005, with his most recent surveillance colonoscopy, by Dr. Howell Rucks, having been in 2011, at which time a 5 mm adenoma was removed. No other abnormalities were noted at that time, specifically, no vascular ectasia or diverticulosis.  From the cardiac perspective, the patient is not doing well. Palliative care has been discussing goals of care with his wife, and there is consideration of referral to hospice.   Past Medical History  Diagnosis Date  . PVD (peripheral vascular disease)   . HTN (hypertension)   . CHF (congestive heart failure)   . Ventricular tachycardia   . Ischemic heart disease, chronic   . Dyslipidemia   . CAD  (coronary artery disease)     a. s/p CABG, last cath 2012 -severe ischemic cardiomyopathy with EF< 15%, patent vein grafts  . Self-catheterizes urinary bladder     since back surgery.  6- 7 times a day. (05/27/2014)  . Subdural hematoma 2013    a. anticoagulation discontinued at that time  . Neurogenic bladder     since back surgery - self caths  . Neurogenic bowel     digitally stimulates daily.  Marland Kitchen COPD (chronic obstructive pulmonary disease)     a. on home O2  . Emphysema   . Anemia     SERUM MONOCLONAL IgM KAPPA PROTEIN - NO CLINICAL EVIDENCE FOR PROGRESSION TO MULTIPLE MYELOMA OR OTHER PROLIFERATIVE DISORDER - PER OFFICE NOTE DR. SHERRILL DATED 12/30/13  . Pneumonia X 2  . Borderline type 2 diabetes mellitus   . TIA (transient ischemic attack) 1980's  . Colon cancer   . MRSA infection   . Permanent atrial fibrillation     Past Surgical History  Procedure Laterality Date  . Lumbar disc surgery  X 2  . Iliac artery stent Left   . Bi-ventricular implantable cardioverter defibrillator  (crt-d)  04-10-08    implantation of Medtronic Concerto Bi-V ICD [; Cristopher Peru MD  . Colonoscopy    . Polypectomy    . Insertion of iliac stent Right 06/04/2012    external   . Cardiac catheterization  2012    patent vein grafts,  EF <15%  . Bi-ventricular implantable cardioverter defibrillator  (crt-d)  2009; 2014    MDT CRTD upgraded from CRTP by Dr Mitzi Davenport; gen change 2014 by Dr Lovena Le  . Endarterectomy femoral  06/28/2012    Procedure: ENDARTERECTOMY FEMORAL;  Surgeon: Serafina Mitchell, MD;  Location: [place];  Service: Vascular;  Laterality: Left;  . Patch angioplasty  06/28/2012    Procedure: PATCH ANGIOPLASTY;  Surgeon: Serafina Mitchell, MD;  Location: [facility] OR;  Service: Vascular;  Laterality: Left;  Using 0.8 cm x 15.2 cm Hemashield Patch.  . Colon surgery  03/2004  . Cataract extraction w/ intraocular lens  implant, bilateral Bilateral   . Cholecystectomy    . Coronary artery bypass graft   1998    x3 vessels  . Femoral-popliteal bypass graft Right 03/28/2013    Procedure: BYPASS GRAFT FEMORAL-BELOW KNEE POPLITEAL ARTERY Using 38mm x 80 cm Propaten Graft ,With Right Common Femoral Artery Endarterectomy.;  Surgeon: Serafina Mitchell, MD;  Location: [facility] OR;  Service: Vascular;  Laterality: Right;  . Debridment of decubitus ulcer N/A 05/18/2014    Procedure: EXCISION AND CLOSURE OF DECUBITUS ULCER 5CM;  Surgeon: Pedro Earls, MD;  Location: [facility] ORS;  Service: General;  Laterality: N/A;  . Posterior lumbar fusion  X 1  . Abdominal aortagram N/A 02/21/2012    Procedure: ABDOMINAL Maxcine Ham;  Surgeon: Serafina Mitchell, MD;  Location: [facility] CATH LAB;  Service: Cardiovascular;  Laterality: N/A;  . Abdominal aortagram N/A 06/04/2012    Procedure: ABDOMINAL AORTAGRAM;  Surgeon: Serafina Mitchell, MD;  Location: [facility] CATH LAB;  Service: Cardiovascular;  Laterality: N/A;  . Biv icd genertaor change out N/A 10/22/2012    MDT CRTD generator change by Dr Lovena Le  . Abdominal aortagram N/A 03/18/2013    Procedure: ABDOMINAL Maxcine Ham;  Surgeon: Serafina Mitchell, MD;  Location: [facility] CATH LAB;  Service: Cardiovascular;  Laterality: N/A;  . Abdominal aortagram N/A 07/02/2013    Procedure: ABDOMINAL AORTAGRAM;  Surgeon: Serafina Mitchell, MD;  Location: [facility] CATH LAB;  Service: Cardiovascular;  Laterality: N/A;  . Lower extremity angiogram Bilateral 07/02/2013    Procedure: LOWER EXTREMITY ANGIOGRAM;  Surgeon: Serafina Mitchell, MD;  Location: [facility] CATH LAB;  Service: Cardiovascular;  Laterality: Bilateral;  . Percutaneous stent intervention Right 07/02/2013    Procedure: PERCUTANEOUS STENT INTERVENTION;  Surgeon: Serafina Mitchell, MD;  Location: [facility] CATH LAB;  Service: Cardiovascular;  Laterality: Right;  rt ext iliac stent  . Esophagogastroduodenoscopy (egd) with propofol N/A 06/29/2014    Procedure: ESOPHAGOGASTRODUODENOSCOPY (EGD) WITH PROPOFOL;  Surgeon: Winfield Cunas., MD;  Location: [facility] ENDOSCOPY;  Service:  Endoscopy;  Laterality: N/A;    Prior to Admission medications   Medication Sig Start Date End Date Taking? Authorizing Provider  acetaminophen (TYLENOL) 325 MG tablet Take 2 tablets (650 mg total) by mouth every 4 (four) hours as needed for headache or mild pain. 08/09/13  Yes Luke K Kilroy, PA-C  albuterol (PROVENTIL HFA;VENTOLIN HFA) 108 (90 BASE) MCG/ACT inhaler Inhale 2 puffs into the lungs every 6 (six) hours as needed for wheezing or shortness of breath.  10/02/12  Yes Kathee Delton, MD  albuterol (PROVENTIL) (2.5 MG/3ML) 0.083% nebulizer solution Take 2.5 mg by nebulization 4 (four) times daily.    Yes Historical Provider, MD  ALPRAZolam (XANAX) 0.25 MG tablet Take 1 tablet (0.25 mg total) by mouth 2 (two) times daily as needed for anxiety (sob). 06/09/14  Yes Costin Karlyne Greenspan, MD  aspirin EC  325 MG tablet Take 325 mg by mouth daily.   Yes Historical Provider, MD  budesonide (PULMICORT) 0.5 MG/2ML nebulizer solution Take 0.5 mg by nebulization 2 (two) times daily.    Yes Historical Provider, MD  candesartan (ATACAND) 32 MG tablet Take 32 mg by mouth daily.   Yes Historical Provider, MD  digoxin (LANOXIN) 0.125 MG tablet Take 0.125 mg by mouth every morning.   Yes Historical Provider, MD  diltiazem (CARDIZEM CD) 180 MG 24 hr capsule Take 1 capsule (180 mg total) by mouth daily. 06/09/14  Yes Costin Karlyne Greenspan, MD  ferrous sulfate 325 (65 FE) MG tablet Take 325 mg by mouth at bedtime.    Yes Historical Provider, MD  furosemide (LASIX) 40 MG tablet TAKE 2 TABLETS IN THE MORNING AND 1 TABLET IN THE EVENING 04/23/14  Yes Kathee Delton, MD  GLUCERNA (GLUCERNA) LIQD Take 237 mLs by mouth 3 (three) times daily between meals.   Yes Historical Provider, MD  guaiFENesin (MUCINEX) 600 MG 12 hr tablet Take 600 mg by mouth 2 (two) times daily. congestion   Yes Historical Provider, MD  Hydrocodone-Acetaminophen 5-300 MG TABS Take 1 tablet by mouth every 6 (six) hours as needed (pain).   Yes Historical  Provider, MD  irbesartan (AVAPRO) 150 MG tablet Take 1 tablet (150 mg total) by mouth daily. 06/09/14  Yes Costin Karlyne Greenspan, MD  megestrol (MEGACE) 20 MG tablet Take 1 tablet (20 mg total) by mouth 2 (two) times daily. 06/09/14  Yes Costin Karlyne Greenspan, MD  mupirocin ointment (BACTROBAN) 2 % Apply 1 application topically 2 (two) times daily as needed (Cuts and bruises).   Yes Historical Provider, MD  nitroGLYCERIN (NITRODUR - DOSED IN MG/24 HR) 0.2 mg/hr patch Place 1 patch onto the skin daily.   Yes Historical Provider, MD  nitroGLYCERIN (NITROSTAT) 0.4 MG SL tablet Place 0.4 mg under the tongue every 5 (five) minutes as needed for chest pain.   Yes Historical Provider, MD  predniSONE (DELTASONE) 10 MG tablet Take 1 tablet (10 mg total) by mouth daily. Take 20 mg (2 tablets) for 4 days then return to 10 mg daily Patient taking differently: Take 10 mg by mouth daily.  06/09/14  Yes Costin Karlyne Greenspan, MD  PRESCRIPTION MEDICATION Uses 1L-2L of home Oxygen   Yes Historical Provider, MD  simvastatin (ZOCOR) 20 MG tablet Take 20 mg by mouth at bedtime.    Yes Historical Provider, MD  tiotropium (SPIRIVA) 18 MCG inhalation capsule Place 18 mcg into inhaler and inhale daily.    Yes Historical Provider, MD  aspirin EC 325 MG tablet Take 325 mg by mouth at bedtime.    Historical Provider, MD  collagenase (SANTYL) ointment Apply topically 2 (two) times daily. Patient not taking: Reported on 06/28/2014 06/02/14   Orson Eva, MD  digoxin (LANOXIN) 0.125 MG tablet TAKE 1 TABLET DAILY 07/13/14   Evans Lance, MD  feeding supplement, ENSURE COMPLETE, (ENSURE COMPLETE) LIQD Take 237 mLs by mouth 2 (two) times daily between meals. Patient not taking: Reported on 06/28/2014 05/29/14   Orson Eva, MD  HYDROcodone-acetaminophen (VICODIN) 2.5-500 MG per tablet Take 1 tablet by mouth every 6 (six) hours as needed for pain.    Historical Provider, MD  KLOR-CON M20 20 MEQ tablet TAKE 1 TABLET DAILY 07/13/14   Kathee Delton, MD   levofloxacin (LEVAQUIN) 500 MG tablet Take 1 tablet (500 mg total) by mouth daily. Patient not taking: Reported on 06/28/2014 06/09/14   Costin  Karlyne Greenspan, MD  PROVENTIL HFA 108 (33 BASE) MCG/ACT inhaler USE 1 TO 2 INHALATIONS INTO THE LUNGS EVERY 6 HOURS AS NEEDED FOR SHORTNESS OF BREATH Patient not taking: Reported on 06/28/2014 06/24/14   Kathee Delton, MD    Current Facility-Administered Medications  Medication Dose Route Frequency Provider Last Rate Last Dose  . 0.9 %  sodium chloride infusion   Intravenous Once Sueanne Margarita, MD      . acetaminophen (TYLENOL) tablet 650 mg  650 mg Oral Q6H PRN Theressa Millard, MD       Or  . acetaminophen (TYLENOL) suppository 650 mg  650 mg Rectal Q6H PRN Theressa Millard, MD      . ALPRAZolam Duanne Moron) tablet 0.25 mg  0.25 mg Oral Daily PRN Brock Ra Lampkin, DO   0.25 mg at 07/16/14 1048  . alum & mag hydroxide-simeth (MAALOX/MYLANTA) 200-200-20 MG/5ML suspension 30 mL  30 mL Oral Q6H PRN Theressa Millard, MD      . budesonide (PULMICORT) nebulizer solution 0.5 mg  0.5 mg Nebulization BID Debbe Odea, MD   0.5 mg at 07/15/14 2107  . digoxin (LANOXIN) tablet 0.125 mg  0.125 mg Oral Daily Evans Lance, MD   0.125 mg at 07/16/14 1046  . diltiazem (CARDIZEM CD) 24 hr capsule 120 mg  120 mg Oral Daily Thompson Grayer, MD   120 mg at 07/16/14 1111  . feeding supplement (GLUCERNA SHAKE) (GLUCERNA SHAKE) liquid 237 mL  237 mL Oral BID BM Satira Sark, MD   237 mL at 07/16/14 1000  . furosemide (LASIX) tablet 60 mg  60 mg Oral BID Deboraha Sprang, MD   60 mg at 07/16/14 0900  . guaiFENesin (MUCINEX) 12 hr tablet 600 mg  600 mg Oral BID Debbe Odea, MD   600 mg at 07/15/14 2124  . haloperidol (HALDOL) tablet 1 mg  1 mg Oral QHS PRN,MR X 1 Morrow, DO      . HYDROmorphone (DILAUDID) injection 0.5-1 mg  0.5-1 mg Intravenous Q3H PRN Theressa Millard, MD   1 mg at 07/15/14 0210  . insulin aspart (novoLOG) injection 0-15 Units  0-15 Units  Subcutaneous TID WC Wilhelmina Mcardle, MD   3 Units at 07/15/14 1707  . insulin aspart (novoLOG) injection 0-5 Units  0-5 Units Subcutaneous QHS Wilhelmina Mcardle, MD   0 Units at 07/06/14 2321  . levalbuterol (XOPENEX) nebulizer solution 0.63 mg  0.63 mg Nebulization Q6H PRN Mihai Croitoru, MD      . metoprolol (LOPRESSOR) injection 5 mg  5 mg Intravenous Q6H PRN Alphia Moh, MD   5 mg at 07/16/14 1102  . metoprolol (LOPRESSOR) tablet 100 mg  100 mg Oral BID Evans Lance, MD   100 mg at 07/16/14 1110  . multivitamin with minerals tablet 1 tablet  1 tablet Oral Daily Claire Sanger, DO   1 tablet at 07/15/14 0934  . ondansetron (ZOFRAN) tablet 4 mg  4 mg Oral Q6H PRN Theressa Millard, MD   4 mg at 07/05/14 1022   Or  . ondansetron (ZOFRAN) injection 4 mg  4 mg Intravenous Q6H PRN Theressa Millard, MD   4 mg at 07/06/14 0941  . oxyCODONE-acetaminophen (PERCOCET/ROXICET) 5-325 MG per tablet 1-2 tablet  1-2 tablet Oral Q3H PRN Brock Ra Lampkin, DO   2 tablet at 07/16/14 0219  . pantoprazole (PROTONIX) EC tablet 40 mg  40 mg Oral BID Geradine Girt, DO  40 mg at 07/16/14 1047  . predniSONE (DELTASONE) tablet 10 mg  10 mg Oral Q breakfast Debbe Odea, MD   10 mg at 07/16/14 1046  . tiotropium (SPIRIVA) inhalation capsule 18 mcg  18 mcg Inhalation Daily Debbe Odea, MD   18 mcg at 07/15/14 0908  . vitamin A capsule 8,000 Units  8,000 Units Oral Daily Claire Sanger, DO   8,000 Units at 07/15/14 0934  . vitamin C (ASCORBIC ACID) tablet 500 mg  500 mg Oral Daily Claire Sanger, DO   500 mg at 07/15/14 0934  . zinc sulfate capsule 220 mg  220 mg Oral Daily Claire Sanger, DO   220 mg at 07/15/14 6734    Allergies as of 06/27/2014  . (No Known Allergies)    Family History  Problem Relation Age of Onset  . Heart failure Father   . Diabetes Father   . Heart disease Father   . Hyperlipidemia Father   . Hypertension Father   . Heart disease Mother     before age 13  . Hyperlipidemia Mother    . Hypertension Mother     History   Social History  . Marital Status: Married    Spouse Name: N/A    Number of Children: N/A  . Years of Education: N/A   Occupational History  .      Retired Social research officer, [employer]   Social History Main Topics  . Smoking status: Former Smoker -- 3.00 packs/day for 48 years    Types: Cigarettes    Quit date: 07/03/2002  . Smokeless tobacco: Never Used  . Alcohol Use: Yes     Comment: 05/27/2014 "a beer q 6 months or so"  . Drug Use: No  . Sexual Activity: No   Other Topics Concern  . Not on file   Social History Narrative   Married, wife [name]   Oxygen-continuous   I/O self cath since back surgery    Review of Systems:  the patient reports that he is significantly fatigued at this time   Physical Exam: Vital signs in last 24 hours: Temp:  [97.9 F (36.6 C)-98.1 F (36.7 C)] 97.9 F (36.6 C) (01/14 0700) Pulse Rate:  [60-142] 136 (01/14 1054) Resp:  [18] 18 (01/14 0700) BP: (100-134)/(38-101) 134/94 mmHg (01/14 1054) SpO2:  [99 %-100 %] 99 % (01/14 0700) Weight:  [65.772 kg (145 lb)] 65.772 kg (145 lb) (01/14 0700) Last BM Date: 07/12/14 The patient is sitting on his bedside, in no evident distress. He is quite pale, both the skin and his conjunctivae. Auscultation of the lungs shows essentially absent vesicular breath sounds. No wheezes or rales are appreciated. The patient is not in evident respiratory distress. Auscultation of the heart discloses distant heart sounds with a rapid rate around 120 or 130 at this time. Rectal exam shows a large clump of firm, but not rockhard, brown stool, which has been sent to the lab for Hemoccult testing. There is no anal stenosis. No gross neurologic deficits.   Intake/Output from previous day: 01/13 0701 - 01/14 0700 In: 240 [P.O.:240] Out: 300 [Urine:300] Intake/Output this shift:    Lab Results:  Recent Labs  07/15/14 0322 07/16/14 0510  WBC 11.8* 7.8  HGB 8.3* 7.6*   HCT 26.6* 23.3*  PLT 335 263   BMET  Recent Labs  07/15/14 0900  NA 130*  K 4.3  CL 91*  CO2 33*  GLUCOSE 160*  BUN 25*  CREATININE 1.00  CALCIUM 8.3*   LFT No results for input(s): PROT, ALBUMIN, AST, ALT, ALKPHOS, BILITOT, BILIDIR, IBILI in the last 72 hours. PT/INR No results for input(s): LABPROT, INR in the last 72 hours.  Studies/Results: No results found.  Impression 1. No evident active GI bleed; the patient is not having frequent stools, and the stool is hard in character and not melenic. The minor drop in hemoglobin recently is felt most likely to reflect phlebotomy and diminished bone marrow function associated with the patient's chronic severe debilitated status  . 2. Current fecal impaction with chronic outlet dysfunction constipation  3. Remote history of colon cancer, up-to-date on colonoscopic surveillance without alternative sources of chronic anemia evident on his most recent exam in 2011    Plan: 1. Send stool for occult blood, although with the grossly nonbloody appearance of the stool, in some ways his Hemoccult status is moot. 2. This patient is too sick to undergo colonoscopic evaluation which, fortunately, is not clinically necessary given the nonbloody appearance of his stool. 3. For management of his constipation, I have ordered Miralax. I'm starting with twice-daily dosing, keeping in mind that it typically take several days for it to have an effect on bowel function. I instructed the patient's wife at the bedside, however this dose can be titrated up or down as an outpatient to achieve the desired effect but avoid excessive results leading to diarrhea and incontinence. 4. Fecal disimpaction once his heart rate settles down. This is planned for later today by the nurse. 5. Once the patient is disimpacted, and the Miralax has had a chance to kick in, it would be reasonable to see if a suppository (glycerin or Dulcolax) every other day with lead to  spontaneous defecation, as is commonly done in, for example, spinal cord injury patients.   This might be a more suitable alternative (if it works), compared to digital disimpaction every other day. 6. I will sign off at this time, but would be happy to see the patient again at your request if needed.   LOS: 19 days   Rumaldo Difatta V  07/16/2014, 12:15 PM

## 2014-07-16 NOTE — Progress Notes (Addendum)
SUBJECTIVE: Asked by Dr. Radford Pax to come by and give additional input on patient's atrial fib and overall decline. The patient wants to go home.  He is still very weak and deconditioned.  Some AF with RVR this morning after Lopressor held last night, but overall well controlled ventricular rates, now atrial fib with BiV pacing.  Receiving blood currently.  No appetite, drinking Glucerna shakes.   CURRENT MEDICATIONS: . sodium chloride   Intravenous Once  . budesonide  0.5 mg Nebulization BID  . digoxin  0.125 mg Oral Daily  . diltiazem  120 mg Oral Daily  . feeding supplement (GLUCERNA SHAKE)  237 mL Oral BID BM  . furosemide  60 mg Oral BID  . guaiFENesin  600 mg Oral BID  . insulin aspart  0-15 Units Subcutaneous TID WC  . insulin aspart  0-5 Units Subcutaneous QHS  . metoprolol tartrate  100 mg Oral BID  . multivitamin with minerals  1 tablet Oral Daily  . pantoprazole  40 mg Oral BID  . predniSONE  10 mg Oral Q breakfast  . tiotropium  18 mcg Inhalation Daily  . vitamin A  8,000 Units Oral Daily  . vitamin C  500 mg Oral Daily  . zinc sulfate  220 mg Oral Daily      OBJECTIVE: Physical Exam: Filed Vitals:   07/16/14 0111 07/16/14 0700 07/16/14 0710 07/16/14 1054  BP: 113/41 131/101 100/47 134/94  Pulse: 80 142  136  Temp:  97.9 F (36.6 C)    TempSrc:  Oral    Resp:  18    Height:      Weight:  145 lb (65.772 kg)    SpO2:  99%      Intake/Output Summary (Last 24 hours) at 07/16/14 1107 Last data filed at 07/15/14 1700  Gross per 24 hour  Intake    240 ml  Output    300 ml  Net    -60 ml    Telemetry reveals AF with intermittent ventricular pacing, V rates mostly 80-100, rates up to 130's this morning after Lopressor held last night, improved after Lopressor given this morning.   Physical Exam: frail appearing man, NAD HEENT: Unremarkable,[place], AT Neck:  7 JVD, no thyromegally Back:  No CVA tenderness Lungs:  Clear with no wheezes, rales, or  rhonchi HEART:  Regular rate rhythm, no murmurs, no rubs, no clicks Abd:  soft, positive bowel sounds, no organomegally, no rebound, no guarding Ext:  2 plus pulses, no edema, no cyanosis, no clubbing Skin:  No rashes no nodules, multiple skin breakdown areas. Neuro:  CN II through XII intact, motor grossly intact   LABS: Basic Metabolic Panel:  Recent Labs  07/15/14 0900  NA 130*  K 4.3  CL 91*  CO2 33*  GLUCOSE 160*  BUN 25*  CREATININE 1.00  CALCIUM 8.3*   CBC:  Recent Labs  07/15/14 0322 07/16/14 0510  WBC 11.8* 7.8  HGB 8.3* 7.6*  HCT 26.6* 23.3*  MCV 96.4 92.8  PLT 335 263    RADIOLOGY: Dg Chest Port 1 View 07/09/2014   CLINICAL DATA:  79 year old male with chronic systolic heart failure and severe shortness of breath, chest and back pain beading earlier this morning  EXAM: PORTABLE CHEST - 1 VIEW  COMPARISON:  Prior chest x-ray 07/01/2014  FINDINGS: Stable position of left subclavian approach biventricular cardiac rhythm maintenance device. Leads project over the right atrium, right ventricle and in the cardiac vein overlying the  left ventricle. Patient is status post median sternotomy with evidence of multivessel CABG including LIMA bypass. Atherosclerotic calcifications again noted in the transverse aorta. The cardiac and mediastinal contours remain stable. No overt pulmonary edema, pleural effusion, pneumothorax or focal airspace consolidation. Similar appearance of left basilar opacity likely reflecting a small pleural effusion and associated atelectasis. This is unchanged compared to 07/01/2014. Prominent right nipple shadow again noted. No acute osseous abnormality.  IMPRESSION: 1. Unchanged appearance of the chest compared to 07/01/2014. Persistent small left pleural effusion and associated left basilar opacity favored to reflect pleural fluid with atelectasis. Superimposed infiltrate is difficult to exclude. 2. No evidence of acute pulmonary edema or CHF.    Electronically Signed   By: Jacqulynn Cadet M.D.   On: 07/09/2014 09:20    ASSESSMENT AND PLAN:  Principal Problem:   Symptomatic anemia Active Problems:   Coronary atherosclerosis   Cardiomyopathy, ischemic- EF 20-25% 2D 08/08/13   Persistent atrial fibrillation   COPD (chronic obstructive pulmonary disease) with emphysema   Biventricular automatic implantable cardioverter defibrillator in situ - MDT   Acute on chronic systolic heart failure, NYHA class 3   Decubitus ulcer of coccygeal region-excision and closure Nov 2015   Hyperkalemia   SOB (shortness of breath)   Sinus tachycardia   Hypotension   AKI (acute kidney injury)   Elevated troponin   Palliative care encounter   Acute pain   Acute delirium  Rec: There is no additional treatment we can offer Mr. Haynesworth. He is on a good dose of beta blocker and diuretic. He has multiple end stage medical problems. I would suggest hospice care at home with pain control in conjunction with medical therapy. I have reviewed all issues with the patient and his wife. His son is coming tomorrow and will be available for a month. The patient would like to try home care. I think hospice more appropriate but have discussed with Dr. Radford Pax. He should be discharged home with Roxanol elixir to take if/when he develops respiratory difficulties which will occur, it is just a matter of time. Dr. Radford Pax to confer with family in the morning.  Mikle Bosworth.D.

## 2014-07-16 NOTE — Progress Notes (Signed)
PT Cancellation Note  Patient Details Name: [name] MRN: [id number] DOB: [birth year]   Cancelled Treatment:    Reason Eval/Treat Not Completed: Medical issues which prohibited therapy Holding Hydro therapy this AM secondary to elevated HR and increased RR. RN notified and aware. Pt agreeable to attempt later in PM.    Folan, Marguarite Arbour A 07/16/2014, 11:37 AM  Candy Sledge, PT, DPT [phone]

## 2014-07-16 NOTE — Progress Notes (Signed)
SUBJECTIVE:  Had dizziness and weakness with afib with RVR this am  OBJECTIVE:   Vitals:   Filed Vitals:   07/15/14 2108 07/16/14 0111 07/16/14 0700 07/16/14 0710  BP:  113/41 131/101 100/47  Pulse: 86 80 142   Temp:   97.9 F (36.6 C)   TempSrc:   Oral   Resp: 18  18   Height:      Weight:   145 lb (65.772 kg)   SpO2: 100%  99%    I&O's:   Intake/Output Summary (Last 24 hours) at 07/16/14 9509 Last data filed at 07/15/14 1700  Gross per 24 hour  Intake    240 ml  Output    300 ml  Net    -60 ml   TELEMETRY: Reviewed telemetry pt in atrial fibrilaltion with HR 80's now:     PHYSICAL EXAM General: Well developed, well nourished, in no acute distress Head: Eyes PERRLA, No xanthomas.   Normal cephalic and atramatic  Lungs:   Clear bilaterally to auscultation and percussion. Heart:   Irregularly irregular S1 S2 Pulses are 2+ & equal. Abdomen: Bowel sounds are positive, abdomen soft and non-tender without masses  Extremities:   No clubbing, cyanosis or edema.  DP +1 Neuro: Alert and oriented X 3. Psych:  Good affect, responds appropriately   LABS: Basic Metabolic Panel:  Recent Labs  07/15/14 0900  NA 130*  K 4.3  CL 91*  CO2 33*  GLUCOSE 160*  BUN 25*  CREATININE 1.00  CALCIUM 8.3*   Liver Function Tests: No results for input(s): AST, ALT, ALKPHOS, BILITOT, PROT, ALBUMIN in the last 72 hours. No results for input(s): LIPASE, AMYLASE in the last 72 hours. CBC:  Recent Labs  07/15/14 0322 07/16/14 0510  WBC 11.8* 7.8  HGB 8.3* 7.6*  HCT 26.6* 23.3*  MCV 96.4 92.8  PLT 335 263   Cardiac Enzymes: No results for input(s): CKTOTAL, CKMB, CKMBINDEX, TROPONINI in the last 72 hours. BNP: Invalid input(s): POCBNP D-Dimer: No results for input(s): DDIMER in the last 72 hours. Hemoglobin A1C: No results for input(s): HGBA1C in the last 72 hours. Fasting Lipid Panel: No results for input(s): CHOL, HDL, LDLCALC, TRIG, CHOLHDL, LDLDIRECT in the last 72  hours. Thyroid Function Tests: No results for input(s): TSH, T4TOTAL, T3FREE, THYROIDAB in the last 72 hours.  Invalid input(s): FREET3 Anemia Panel: No results for input(s): VITAMINB12, FOLATE, FERRITIN, TIBC, IRON, RETICCTPCT in the last 72 hours. Coag Panel:   Lab Results  Component Value Date   INR 1.02 06/03/2014   INR 1.10 08/08/2013   INR 1.09 03/18/2013    RADIOLOGY: Dg Chest Port 1 View  07/09/2014   CLINICAL DATA:  79 year old male with chronic systolic heart failure and severe shortness of breath, chest and back pain beading earlier this morning  EXAM: PORTABLE CHEST - 1 VIEW  COMPARISON:  Prior chest x-ray 07/01/2014  FINDINGS: Stable position of left subclavian approach biventricular cardiac rhythm maintenance device. Leads project over the right atrium, right ventricle and in the cardiac vein overlying the left ventricle. Patient is status post median sternotomy with evidence of multivessel CABG including LIMA bypass. Atherosclerotic calcifications again noted in the transverse aorta. The cardiac and mediastinal contours remain stable. No overt pulmonary edema, pleural effusion, pneumothorax or focal airspace consolidation. Similar appearance of left basilar opacity likely reflecting a small pleural effusion and associated atelectasis. This is unchanged compared to 07/01/2014. Prominent right nipple shadow again noted. No acute osseous abnormality.  IMPRESSION: 1. Unchanged appearance of the chest compared to 07/01/2014. Persistent small left pleural effusion and associated left basilar opacity favored to reflect pleural fluid with atelectasis. Superimposed infiltrate is difficult to exclude. 2. No evidence of acute pulmonary edema or CHF.   Electronically Signed   By: Jacqulynn Cadet M.D.   On: 07/09/2014 09:20   Dg Chest Port 1 View  07/01/2014   CLINICAL DATA:  Congestive heart failure. Chest pain. Atrial fibrillation.  EXAM: PORTABLE CHEST - 1 VIEW  COMPARISON:  06/27/2014   FINDINGS: Sequelae of prior CABG are again identified. ICD remains in place. Cardiac silhouette remains enlarged. There is increased retrocardiac opacity in the left lung base with partial obscuration of the left hemidiaphragm. A small left pleural effusion is questioned. The right lung remains grossly clear. No pneumothorax is identified. Right upper quadrant abdominal surgical clips are noted.  IMPRESSION: Left basilar atelectasis versus infiltrate. Question small left pleural effusion.   Electronically Signed   By: Logan Bores   On: 07/01/2014 10:49   Dg Chest Port 1 View  06/28/2014   CLINICAL DATA:  Constant short of breath. Chest pain. Leg swelling.  EXAM: PORTABLE CHEST - 1 VIEW  COMPARISON:  Radiograph 06/03/2014  FINDINGS: Left-sided pacemaker overlies stable enlarged cardiac silhouette. No effusion, infiltrate, pneumothorax.  IMPRESSION: Cardiomegaly without acute findings .   Electronically Signed   By: Suzy Bouchard M.D.   On: 06/28/2014 00:00   Assessment/Plan:   1. Persistent atrial fibrillation with RVR. Not on anticoagulation with symptomatic anemia. Recent chart notes reviewed. Currently on Cardizem CD, Lopressor, and Lanoxin. Medical therapy titration has been limited by blood pressure. He has been seen by EP, felt to be poor candidate for antiarrhythmic therapy or anesthesia.  He continues to have symptomatic afib with RVR.  Apparently had not gotten his Lopressor last night due to low diastolic BP.  I will get EP to see again.  2. NSVT.  3. Ischemic cardiomyopathy with LVEF approximately 20%. Patient with [employer] biventricular ICD in place, followed by EP.  4. Demand ischemia, peak troponin I 0.82.  5. 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  6. Severe COPD.  7. Symptomatic anemia now with Hbg dropping again.  Will transfuse 2 units PRBCs and get GI to see.  He is having dark stools.  8. Failure to thrive.  9. Sacral decubitus.  10. Acute  renal insufficiency, creatinine currently 0.9.  11. Hypotension secondary to rapid afib. Improved but still soft.  Plan/Discussion:    Complex patient - He was seen by Palliative care, their note is reviewed and appreciated. Plan at this time is send patient home with [facility] and wound care as well as PT. If he does not progress then wife will get in home Hospice care. I have had a long discussion with patient and wife and recommended that we revisit SNF or Hospice at facility.  I dont think his wife is going to be able to care for him at home.   Sueanne Margarita, MD  07/16/2014  8:21 AM

## 2014-07-16 NOTE — Progress Notes (Signed)
Dr. Fransico Him aware of hemoglobin of 7.6.  Wife reports dark stools.  Wife also reports that patient requires disimpaction every other day due to neurological damage of anal sphincter muscles.  Pulse rate in 80's with normalized B/P.

## 2014-07-16 NOTE — Progress Notes (Signed)
Have asked palliative care to return for consult.  Pt has poor prognosis and we do not believe wife can care for him at home.

## 2014-07-17 ENCOUNTER — Encounter (HOSPITAL_COMMUNITY): Payer: Self-pay | Admitting: Cardiology

## 2014-07-17 DIAGNOSIS — T462X5A Adverse effect of other antidysrhythmic drugs, initial encounter: Secondary | ICD-10-CM

## 2014-07-17 DIAGNOSIS — R339 Retention of urine, unspecified: Secondary | ICD-10-CM

## 2014-07-17 DIAGNOSIS — Z7409 Other reduced mobility: Secondary | ICD-10-CM | POA: Diagnosis present

## 2014-07-17 DIAGNOSIS — I4729 Other ventricular tachycardia: Secondary | ICD-10-CM

## 2014-07-17 DIAGNOSIS — L89159 Pressure ulcer of sacral region, unspecified stage: Secondary | ICD-10-CM

## 2014-07-17 DIAGNOSIS — K5641 Fecal impaction: Secondary | ICD-10-CM | POA: Diagnosis present

## 2014-07-17 DIAGNOSIS — J984 Other disorders of lung: Secondary | ICD-10-CM

## 2014-07-17 DIAGNOSIS — I472 Ventricular tachycardia: Secondary | ICD-10-CM

## 2014-07-17 HISTORY — DX: Retention of urine, unspecified: R33.9

## 2014-07-17 HISTORY — DX: Other reduced mobility: Z74.09

## 2014-07-17 HISTORY — DX: Other disorders of lung: J98.4

## 2014-07-17 LAB — GLUCOSE, CAPILLARY
GLUCOSE-CAPILLARY: 181 mg/dL — AB (ref 70–99)
GLUCOSE-CAPILLARY: 182 mg/dL — AB (ref 70–99)
Glucose-Capillary: 116 mg/dL — ABNORMAL HIGH (ref 70–99)

## 2014-07-17 MED ORDER — VITAMIN A 8000 UNITS PO CAPS: 8000.0000 [IU] | ORAL_CAPSULE | Freq: Every day | ORAL | Status: DC

## 2014-07-17 MED ORDER — FUROSEMIDE 20 MG PO TABS: 60.0000 mg | ORAL_TABLET | Freq: Two times a day (BID) | ORAL | Status: DC

## 2014-07-17 MED ORDER — METOPROLOL TARTRATE 100 MG PO TABS: 100.0000 mg | ORAL_TABLET | Freq: Two times a day (BID) | ORAL | Status: AC

## 2014-07-17 MED ORDER — GLUCERNA PO LIQD: 237.0000 mL | Freq: Three times a day (TID) | ORAL | Status: AC

## 2014-07-17 MED ORDER — ASCORBIC ACID 500 MG PO TABS: 500.0000 mg | ORAL_TABLET | Freq: Every day | ORAL | Status: AC

## 2014-07-17 MED ORDER — ASCORBIC ACID 500 MG PO TABS: 500.0000 mg | ORAL_TABLET | Freq: Every day | ORAL | Status: DC

## 2014-07-17 MED ORDER — ZINC SULFATE 220 (50 ZN) MG PO CAPS: 220.0000 mg | ORAL_CAPSULE | Freq: Every day | ORAL | Status: DC

## 2014-07-17 MED ORDER — PREDNISONE 10 MG PO TABS: 10.0000 mg | ORAL_TABLET | Freq: Every day | ORAL | Status: AC

## 2014-07-17 MED ORDER — DILTIAZEM HCL ER COATED BEADS 120 MG PO CP24: 120.0000 mg | ORAL_CAPSULE | Freq: Every day | ORAL | Status: AC

## 2014-07-17 MED ORDER — OXYCODONE HCL 20 MG/ML PO CONC: 5.0000 mg | ORAL | Status: AC | PRN

## 2014-07-17 MED ORDER — PANTOPRAZOLE SODIUM 40 MG PO TBEC: 40.0000 mg | DELAYED_RELEASE_TABLET | Freq: Two times a day (BID) | ORAL | Status: DC

## 2014-07-17 MED ORDER — PANTOPRAZOLE SODIUM 40 MG PO TBEC: 40.0000 mg | DELAYED_RELEASE_TABLET | Freq: Two times a day (BID) | ORAL | Status: AC

## 2014-07-17 MED ORDER — DIGOXIN 125 MCG PO TABS: 0.1250 mg | ORAL_TABLET | Freq: Every morning | ORAL | Status: AC

## 2014-07-17 MED ORDER — DILTIAZEM HCL ER COATED BEADS 120 MG PO CP24: 120.0000 mg | ORAL_CAPSULE | Freq: Every day | ORAL | Status: DC

## 2014-07-17 MED ORDER — DAKINS (1/2 STRENGTH) 0.25 % EX SOLN: 1.0000 "application " | Freq: Once | CUTANEOUS | Status: DC

## 2014-07-17 MED ORDER — ZINC SULFATE 220 (50 ZN) MG PO CAPS: 220.0000 mg | ORAL_CAPSULE | Freq: Every day | ORAL | Status: AC

## 2014-07-17 MED ORDER — GLUCERNA SHAKE PO LIQD: 237.0000 mL | Freq: Two times a day (BID) | ORAL | Status: DC

## 2014-07-17 MED ORDER — POLYETHYLENE GLYCOL 3350 17 G PO PACK: 17.0000 g | PACK | Freq: Two times a day (BID) | ORAL | Status: AC

## 2014-07-17 MED ORDER — METOPROLOL TARTRATE 100 MG PO TABS: 100.0000 mg | ORAL_TABLET | Freq: Two times a day (BID) | ORAL | Status: DC

## 2014-07-17 MED ORDER — ADULT MULTIVITAMIN W/MINERALS CH: 1.0000 | ORAL_TABLET | Freq: Every day | ORAL | Status: AC

## 2014-07-17 MED ORDER — VITAMIN A 8000 UNITS PO CAPS: 8000.0000 [IU] | ORAL_CAPSULE | Freq: Every day | ORAL | Status: AC

## 2014-07-17 NOTE — Progress Notes (Signed)
Reviewed discharge instructions with wife and son. Both verbalized understanding of follow up appointments along with medication teaching. VSS, pt. Left with home oxygen at discharge, and accompanied home by wife.

## 2014-07-17 NOTE — Progress Notes (Signed)
Physical Therapy Treatment Patient Details Name: [name] MRN: [id number] DOB: 1935/09/25 Today's Date: 07/17/2014    History of Present Illness JOZEF EISENBEIS is a 79 y.o. male with a history of CAD, Ischemic Cardiomyopathy, Systolic CHF, COPD, Chronic Respiratory Failure , HTN , Hyperlipidemia, DM2, Remote history of Colon Cancer who presents to the ED with complaints of SOB and Chest pain . Shortness of breath was transient and improved after being placed on BiPAP in the ER. BP was 010 systolic HR 071-219. He was found to be anemic. On 12/30 patient moved to ICU secondary to Medical issues including c/o of chest pain, short of breath, Pt HR sustaining 150s-170s and elevated troponin.     PT Comments    Pt able to ambulate today without knee buckling and perform HEP. Pt seated on pillows end of session due to sacral discomfort and nursing staff aware of mobility and position. Pt encouraged to continue HEP even in bed and to increase mobility to be OOB daily. Will continue to follow.    Follow Up Recommendations  CIR     Equipment Recommendations       Recommendations for Other Services       Precautions / Restrictions Precautions Precautions: Fall Precaution Comments: knees buckle when pt becomes fatigued Restrictions Weight Bearing Restrictions: No    Mobility  Bed Mobility Overal bed mobility: Needs Assistance Bed Mobility: Rolling;Sidelying to Sit Rolling: Min assist Sidelying to sit: Min guard       General bed mobility comments: cues for sequence  Transfers Overall transfer level: Needs assistance   Transfers: Sit to/from Stand Sit to Stand: Mod assist;From elevated surface         General transfer comment: cues for hand placment, anterior translation and assist for elevation from surface  Ambulation/Gait Ambulation/Gait assistance: Min assist Ambulation Distance (Feet): 55 Feet Assistive device: Rolling walker (2 wheeled) Gait  Pattern/deviations: Trunk flexed;Shuffle   Gait velocity interpretation: Below normal speed for age/gender General Gait Details: cues for posture pt maintaining 98% on RA throughout session with chair followed due to pt fatigue. No knee buckling but able to state when needed to sit. Denied second ambulation trial   Stairs            Wheelchair Mobility    Modified Rankin (Stroke Patients Only)       Balance Overall balance assessment: Needs assistance   Sitting balance-Leahy Scale: Good       Standing balance-Leahy Scale: Poor                      Cognition Arousal/Alertness: Awake/alert Behavior During Therapy: WFL for tasks assessed/performed Overall Cognitive Status: Within Functional Limits for tasks assessed                      Exercises General Exercises - Lower Extremity Long Arc Quad: AROM;Both;Seated;15 reps Hip ABduction/ADduction: AAROM;Both;15 reps;Seated Hip Flexion/Marching: AROM;Both;15 reps;Seated Toe Raises: AROM;Both;15 reps;Seated Heel Raises: AROM;Both;15 reps;Seated    General Comments        Pertinent Vitals/Pain Pain Score: 5  Pain Location: sacrum Pain Descriptors / Indicators: Aching Pain Intervention(s): Repositioned  98% on RA    Home Living                      Prior Function            PT Goals (current goals can now be found in the care plan section)  Progress towards PT goals: Progressing toward goals (slowly)    Frequency       PT Plan Current plan remains appropriate    Co-evaluation             End of Session Equipment Utilized During Treatment: Gait belt Activity Tolerance: Patient tolerated treatment well Patient left: in chair;with call bell/phone within reach;with chair alarm set     Time: 1916-6060 PT Time Calculation (min) (ACUTE ONLY): 25 min  Charges:  $Gait Training: 8-22 mins $Therapeutic Exercise: 8-22 mins                    G Codes:      Melford Aase 07/17/2014, 8:49 AM Elwyn Reach, [place]

## 2014-07-17 NOTE — Discharge Summary (Signed)
Physician Discharge Summary       Patient ID: [name] MRN: [id number] DOB/AGE: 12/22/1935 79 y.o.  Admit date: 06/27/2014 Discharge date: 07/17/2014 Primary Cardiologist:Dr. Taylor/ Dr. Tamala Julian  Discharge Diagnoses:  Principal Problem:   Symptomatic anemia Active Problems:   Coronary atherosclerosis   Cardiomyopathy, ischemic- EF 20-25% 2D 08/08/13   Persistent atrial fibrillation   COPD (chronic obstructive pulmonary disease) with emphysema   Biventricular automatic implantable cardioverter defibrillator in situ - MDT   Acute on chronic systolic heart failure, NYHA class 3   Decubitus ulcer of coccygeal region-excision and closure Nov 2015   Wound infection   Hyperkalemia   Protein-calorie malnutrition, severe   Acute on chronic respiratory failure on admit requiring BiPap, and continued oxygen   COPD exacerbation   SOB (shortness of breath)   Sinus tachycardia   Hypotension   AKI (acute kidney injury)   Elevated troponin   Palliative care encounter   Acute pain   Acute delirium   Urinary retention, discharged with foley, will go back to In and Out catheterizations once stronger.  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 Lala Lund.  Hospital Course: 79 year old chronically ill gentleman who was admitted December 27 with shortness of breath, severe-acute respiratory failure requiring BiPAP.  The patient has a long-standing history of severe ischemic cardiomyopathy with LVEF less than 30%. He has had multiple recent hospital admissions. The patient initially was treated with BiPAP and he had clinical improvement with this. His shortness of breath started rather suddenly and may have been associated with cardiac dysrhythmia as his heart rate was noted to be around 150  bpm per his wife. The patient was also noted to have a hemoglobin of 7.0 on admission. He has chronic anemia but this is worse than his baseline. He underwent endoscopy with no clear evidence of a bleeding source. Colonoscopy was planned, but he is not felt to be stable for this from a cardiopulmonary perspective.  Cardiology was also consulted.  The patient denied chest pain or shortness of breath by the 29th of December.  However, he has had significant shortness of breath over recent weeks. His main problem has become progressive generalized weakness. He has developed a sacral decubitus ulcer- present for some time but now with foul odor. He is not able to get around very well at all anymore. He denied bright red blood in his stool. He denied syncope. He did have an ICD discharge earlier this month, but he was too weak to come in for evaluation. His device was interrogated at his home and this demonstrated an appropriate shock for VF after failed ATP. Apparently some adjustments to his ICD were made at that time.  Of note, the patient has a long history of atrial fibrillation. He has not been on chronic anticoagulation because of problems related to anemia. He has previously been on amiodarone, but this was discontinued in 2010. Dr. Burt Knack went back and reviewed several office notes from 2010. It appears that amiodarone was stopped because of lung toxicity. He had progressive shortness of breath and cough and was found to have elevated sedimentation rate at that time. Symptoms improved once amiodarone was discontinued.   Anemia acute and symptomatic:  Transfused 2 units PRBC on 06/27/14.  EGD with nothing to explain the patient's  anemia.  Pt on protonix 40 mg BID.  Too ill for colonoscopy.   Continues to be anemic. At times hemoccult was positive but prior to discharge negative.  Will follow up CBC on Monday.  Atrial fib with RVR- at discharge he was rate controlled and pacing.  Episodes throughout  hospitalization of RVR, treated with dig, BB and CCB as able depending on BP.   Acute on Chronic respiratory failure:  Treated with BiPap initially he will continue his home oxygen and his chronic steroids.  COPD (chronic obstructive pulmonary disease) with emphysema - on Chronic O2 at 2.5 L and prednisone, discharged on 10 mg daily his usual dose  Acute systolic HF, with BiV ICD.  Then acute pulmonary edema on the 30th of Dec.  Transferred to ICU, pt ageed to intubation and full code, he did not wish long term support.  Diuresed throughout hospital stay.  -[id number],  Wt on admit 139 lbs peaked to 156 and at discharge 145 lbs. Discharged on 60 mg lasix BID.  Pt with mod to severe MR as well.  Elevated troponin on admit- secondary to demand ischemia with acute anemia. Troponin 0.49.   CAD no chest pain. Hx of CAD. ASA held due to anemia.  Cardiomyopathy, ischemic- end stage-EF 20-25% 2D 08/08/13 has BiV ICD, ARB stopped secondary to hypotension and AKI initially- palliative care consult was placed -- they talked with wife but she is not yet able to make these decisions.  We did provide oxycodone for pain and SOB at discharge.  I instructed wife she could call hospice at any time to receive their help.  She and the son understood this at discharge.   Hypotension: with his hypotension our treatment for rapid a fib was difficult to control.   This may be an ongoing problem for the pt.  Was stable at discharge.  Decubitus ulcer of coccygeal region-excision and closure Nov 2015 -Wound care eval completed several times, was seen by our wound care and then by [facility] and pt with PT with hydrotherapy and wet to dry with [place] dressings.  Prior to discharge wound care back to Dr. Hassell Done and pt will have wet to dry dressings BID at least.  Wife is aware how to do and home health will also assist.   DM- pt was not treated prior to admit, here [place] will follow glucose and notify us if glucose  staying > 150.  Urinary retention:  Since back surgery, here uanable to do his In and out caths so foley was placed.  He will keep foley for now until stronger and can do In and Out caths.  This cath should be removed by 07/29/14.  Fecal impaction secondary to chronic outlet dysfunction.: chronic, last impacted 07/16/14, miralax was recommended and given in hospital.  Glycerin or dulcolax suppos. every other day was also recommended, though pt prefers the way he has always done.    AKI: lasix held but with improvement lasix resumed.  Lt Arm edema:  From CHF, neg DVT.  NSVT on BB.  Delerium at times during hospitalization. This had resolved prior to discharge.   Declining mobility - Pt at home through home health.  Again discussed hospice with wife.  Pain: for pain with sacral ulcer and for acute SOB pt has xanax and oxycodone elixer.  His wife was instructed when to give to the pt.  I discussed the oxycodone with pallative care before prescribing.  Malnutrition:  Resource drinks  At  discharge both Dr. Lovena Le and Dr. Radford Pax have discussed SNF for pt.  The wife is insistent she can care for him at home.  The son has arrived from [place]. To assist.  Pt is very ill and the patient and his family are aware of this.  They hope to care for him at home though they know they have other options if this does not work.  He was stable for his condition for discharge.  Home health will draw CBC and BMP on 07/20/14.  Consults: cardiology, pulmonary/intensive care and GI, surgical with plastics and general.  Palliative care.  Significant Diagnostic Studies:  BMET    Component Value Date/Time   NA 130* 07/15/2014 0900   NA 139 02/06/2013 1127   K 4.3 07/15/2014 0900   K 4.3 02/06/2013 1127   CL 91* 07/15/2014 0900   CO2 33* 07/15/2014 0900   CO2 31* 02/06/2013 1127   GLUCOSE 160* 07/15/2014 0900   GLUCOSE 152* 02/06/2013 1127   BUN 25* 07/15/2014 0900   BUN 24.1 02/06/2013 1127   CREATININE 1.00  07/15/2014 0900   CREATININE 1.0 02/06/2013 1127   CALCIUM 8.3* 07/15/2014 0900   CALCIUM 9.4 02/06/2013 1127   GFRNONAA 71* 07/15/2014 0900   GFRAA 82* 07/15/2014 0900    CBC    Component Value Date/Time   WBC 7.8 07/16/2014 0510   WBC 8.6 12/30/2013 0941   RBC 2.51* 07/16/2014 0510   RBC 2.85* 07/01/2014 0156   RBC 3.67* 12/30/2013 0941   HGB 7.6* 07/16/2014 0510   HGB 10.6* 12/30/2013 0941   HCT 23.3* 07/16/2014 0510   HCT 33.1* 12/30/2013 0941   PLT 263 07/16/2014 0510   PLT 172 12/30/2013 0941   MCV 92.8 07/16/2014 0510   MCV 90.1 12/30/2013 0941   MCH 30.3 07/16/2014 0510   MCH 28.9 12/30/2013 0941   MCHC 32.6 07/16/2014 0510   MCHC 32.1 12/30/2013 0941   RDW 15.6* 07/16/2014 0510   RDW 16.1* 12/30/2013 0941   LYMPHSABS 2.0 06/27/2014 2314   LYMPHSABS 0.8* 12/30/2013 0941   MONOABS 1.1* 06/27/2014 2314   MONOABS 1.0* 12/30/2013 0941   EOSABS 0.1 06/27/2014 2314   EOSABS 0.0 12/30/2013 0941   BASOSABS 0.0 06/27/2014 2314   BASOSABS 0.0 12/30/2013 0941    Troponin 0.49  Hepatic Function Latest Ref Rng 06/27/2014 06/05/2014 06/03/2014  Total Protein 6.0 - 8.3 g/dL 6.2 6.2 6.6  Albumin 3.5 - 5.2 g/dL 2.4(L) 2.5(L) 2.7(L)  AST 0 - 37 U/L 52(H) 11 17  ALT 0 - 53 U/L 34 16 23  Alk Phosphatase 39 - 117 U/L 79 60 65  Total Bilirubin 0.3 - 1.2 mg/dL 0.4 0.4 0.6  Bilirubin, Direct 0.0 - 0.3 mg/dL - - -    PORTABLE CHEST - 1 VIEW COMPARISON: Prior chest x-ray 07/01/2014 FINDINGS: Stable position of left subclavian approach biventricular cardiac rhythm maintenance device. Leads project over the right atrium, right ventricle and in the cardiac vein overlying the left ventricle. Patient is status post median sternotomy with evidence of multivessel CABG including LIMA bypass. Atherosclerotic calcifications again noted in the transverse aorta. The cardiac and mediastinal contours remain stable. No overt pulmonary edema, pleural effusion, pneumothorax or focal  airspace consolidation. Similar appearance of left basilar opacity likely reflecting a small pleural effusion and associated atelectasis. This is unchanged compared to 07/01/2014. Prominent right nipple shadow again noted. No acute osseous abnormality.  IMPRESSION: 1. Unchanged appearance of the chest compared to 07/01/2014. Persistent small left pleural  effusion and associated left basilar opacity favored to reflect pleural fluid with atelectasis. Superimposed infiltrate is difficult to exclude. 2. No evidence of acute pulmonary edema or CHF.  Discharge Exam: Blood pressure 149/116, pulse 71, temperature 98.3 F (36.8 C), temperature source Oral, resp. rate 20, height $RemoveBe'5\' 7"'jhZjQnaow$  (1.702 m), weight 145 lb (65.772 kg), SpO2 99 %.    Disposition: 01-Home or Self Care  With home health, pt with hospital bed, hoyer lift, wheelchair and ramps.  He has his home oxygen as well.      Discharge Instructions    Ambulatory Referral for DME    Complete by:  As directed   [facility] lift.     Continue foley catheter    Complete by:  As directed      Face-to-face encounter (required for Medicare/Medicaid patients)    Complete by:  As directed   I [facility] R certify that this patient is under my care and that I, or a nurse practitioner or physician's assistant working with me, had a face-to-face encounter that meets the physician face-to-face encounter requirements with this patient on 07/14/2014. The encounter with the patient was in whole, or in part for the following medical condition(s) which is the primary reason for home health care (List medical condition): failure to thrive, a fib , ischemic cardiomyopathy , severe COPD.  The encounter with the patient was in whole, or in part, for the following medical condition, which is the primary reason for home health care:  failure to thrive, severe COPD, Atrial fib, ischemic cardiomyopathy,sacral wound  I certify that, based on my findings, the following  services are medically necessary home health services:   Nursing Physical therapy    Reason for Medically Necessary Home Health Services:   Skilled Nursing- Change/Decline in Patient Status Skilled Nursing- Changes in Medication/Medication Management Skilled Nursing- Skilled Assessment/Observation Skilled Nursing- Assessment and Observation of Wound Status Therapy- Personnel officer, Public librarian Therapy- Home Adaptation to Facilitate Safety Therapy- Instruction on Safe use of Assistive Devices for ADLs    My clinical findings support the need for the above services:   Shortness of breath with activity Can transfer bed to chair only Unsafe ambulation due to balance issues    Further, I certify that my clinical findings support that this patient is homebound due to:   Shortness of Breath with activity Unable to leave home safely without assistance Ambulates short distances less than 300 feet    Pt will need wet to dry dressings of sacrum with Dakins solution twice a day with dressing changes. Please assist family.  Also with [facility] visits to check accucheck/CBGs and notify MD if staying greater than  150  Foley care- it was placed 06/28/14.    please draw lab CBC, BMP on Monday the 18th of Jan.  Results to Dr. Cristopher Peru.     Home Health    Complete by:  As directed   To provide the following care/treatments:   PT [place]              Medication List    STOP taking these medications        candesartan 32 MG tablet  Commonly known as:  ATACAND     collagenase ointment  Commonly known as:  SANTYL     HYDROcodone-acetaminophen 2.5-500 MG per tablet  Commonly known as:  VICODIN     Hydrocodone-Acetaminophen 5-300 MG Tabs     irbesartan 150 MG tablet  Commonly known as:  AVAPRO     levofloxacin 500 MG tablet  Commonly known as:  LEVAQUIN     mupirocin ointment 2 %  Commonly known as:  BACTROBAN     simvastatin 20 MG tablet    Commonly known as:  ZOCOR      TAKE these medications        acetaminophen 325 MG tablet  Commonly known as:  TYLENOL  Take 2 tablets (650 mg total) by mouth every 4 (four) hours as needed for headache or mild pain.     albuterol (2.5 MG/3ML) 0.083% nebulizer solution  Commonly known as:  PROVENTIL  Take 2.5 mg by nebulization 4 (four) times daily.     albuterol 108 (90 BASE) MCG/ACT inhaler  Commonly known as:  PROVENTIL HFA;VENTOLIN HFA  Inhale 2 puffs into the lungs every 6 (six) hours as needed for wheezing or shortness of breath.     ALPRAZolam 0.25 MG tablet  Commonly known as:  XANAX  Take 1 tablet (0.25 mg total) by mouth 2 (two) times daily as needed for anxiety (sob).     ascorbic acid 500 MG tablet  Commonly known as:  VITAMIN C  Take 1 tablet (500 mg total) by mouth daily.     aspirin EC 325 MG tablet  Take 325 mg by mouth daily.     budesonide 0.5 MG/2ML nebulizer solution  Commonly known as:  PULMICORT  Take 0.5 mg by nebulization 2 (two) times daily.     digoxin 0.125 MG tablet  Commonly known as:  LANOXIN  Take 1 tablet (0.125 mg total) by mouth every morning.     diltiazem 120 MG 24 hr capsule  Commonly known as:  CARDIZEM CD  Take 1 capsule (120 mg total) by mouth daily.     ferrous sulfate 325 (65 FE) MG tablet  Take 325 mg by mouth at bedtime.     furosemide 20 MG tablet  Commonly known as:  LASIX  Take 3 tablets (60 mg total) by mouth 2 (two) times daily.     GLUCERNA Liqd  Take 237 mLs by mouth 3 (three) times daily between meals.     guaiFENesin 600 MG 12 hr tablet  Commonly known as:  MUCINEX  Take 600 mg by mouth 2 (two) times daily. congestion     KLOR-CON M20 20 MEQ tablet  Generic drug:  potassium chloride SA  TAKE 1 TABLET DAILY     megestrol 20 MG tablet  Commonly known as:  MEGACE  Take 1 tablet (20 mg total) by mouth 2 (two) times daily.     metoprolol 100 MG tablet  Commonly known as:  LOPRESSOR  Take 1 tablet (100 mg  total) by mouth 2 (two) times daily.     multivitamin with minerals Tabs tablet  Take 1 tablet by mouth daily.     nitroGLYCERIN 0.4 MG SL tablet  Commonly known as:  NITROSTAT  Place 0.4 mg under the tongue every 5 (five) minutes as needed for chest pain.     oxyCODONE 20 MG/ML concentrated solution  Commonly known as:  ROXICODONE INTENSOL  Take 0.3 mLs (6 mg total) by mouth every 4 (four) hours as needed for severe pain (for pain and or SOB).     pantoprazole 40 MG tablet  Commonly known as:  PROTONIX  Take 1 tablet (40 mg total) by mouth 2 (two) times daily.     polyethylene glycol packet  Commonly known as:  MIRALAX / GLYCOLAX  Take 17 g by mouth 2 (two) times daily.     predniSONE 10 MG tablet  Commonly known as:  DELTASONE  Take 1 tablet (10 mg total) by mouth daily with breakfast.     PRESCRIPTION MEDICATION  Uses 1L-2L of home Oxygen     tiotropium 18 MCG inhalation capsule  Commonly known as:  SPIRIVA  Place 18 mcg into inhaler and inhale daily.     vitamin A 8000 UNIT capsule  Take 1 capsule (8,000 Units total) by mouth daily.     zinc sulfate 220 MG capsule  Take 1 capsule (220 mg total) by mouth daily.       Follow-up Information    Follow up with [facility].   Why:  [place]- resumed with [name] - RN/PT/aide   Contact information:   [address] [place]  [address] [phone]       Follow up with [facility]              In 1 week.   Contact information:   [address] [address] [phone]      Follow up with Cristopher Peru, MD.   Specialty:  Cardiology   Why:  the office will call Monday and give date and time for appointment.     Contact information:   [address] [address] [phone]        Discharge Instructions: Heart Healthy diet low salt.  No concentrated sweets your sugar (glucose) has been up some here.   Home Health will check your glucose several times a week.  They will call if it is staying elevated.  If pt has shortness of breath due to rapid HR, try the xanax once as needed and instructed.  If pt with shortness of breath, but with pain try the oxycodone.    If pt is not doing well you could call the office or the physician on call at night--[phone]  If you feel overwhelmed or your wife is you could call Hospice and ask for a consult, you can self refer.  You have your oxygen at home use it.    On the foley catheter in your bladder.  Keep it in place for at least 1 week to get your strength up to do your own in and our catheters.  BUT if you want to keep it longer---it must be out on the 27th of Jan.  If you still find that you need it- then call the office or have home health call to have it replaced.     Drink the Glucerna to help keep your nutrition up.  Dressing changes per Dr. Hassell Done, Dressing changes per Dr. Hassell Done, wet to dry with saline twice a day.    If you have dizziness or feel like you may pass out, it may be a low blood pressure, it would get low here, check you BP and if you have questions about meds just call the office.  You may use the Miralax to help with constipation and impaction and suppositories every other day.  Either dulcolax or glycerin suppos.    You should have [place] lift sent as well.   Signed: Isaiah Serge Nurse Practitioner-Certified [facility]: [facility] 07/17/2014, 7:51 PM  Time spent on discharge :> 60 minutes.

## 2014-07-17 NOTE — Progress Notes (Signed)
Subjective: Resting   Objective: Vital signs in last 24 hours: Temp:  [97.4 F (36.3 C)-98.3 F (36.8 C)] 98.3 F (36.8 C) (01/15 0447) Pulse Rate:  [71-132] 71 (01/15 0845) Resp:  [18-20] 20 (01/15 0447) BP: (109-149)/(41-125) 149/116 mmHg (01/15 0447) SpO2:  [99 %-100 %] 99 % (01/15 0825) Weight change:  Last BM Date: 07/16/14 Intake/Output from previous day: -90 01/14 0701 - 01/15 0700 In: 660 [P.O.:300; Blood:360] Out: 750 [Urine:750] Intake/Output this shift:    PE: General:Pleasant affect once waken, NAD Skin:Warm and dry, brisk capillary refill HEENT:normocephalic, sclera clear, mucus membranes moist Heart:S1S2 RRR with soft murmur, gallup, rub or click Lungs:clear without rales, rhonchi, or wheezes TDV:VOHY, non tender, + BS, do not palpate liver spleen or masses Ext:no lower ext edema, 2+ pedal pulses, 2+ radial pulses, sacral wound dressed. Neuro:alert and oriented, MAE, follows commands, + facial symmetry Tele:  A fib with pacing rate controlled   Lab Results:  Recent Labs  07/15/14 0322 07/16/14 0510  WBC 11.8* 7.8  HGB 8.3* 7.6*  HCT 26.6* 23.3*  PLT 335 263   BMET  Recent Labs  07/15/14 0900  NA 130*  K 4.3  CL 91*  CO2 33*  GLUCOSE 160*  BUN 25*  CREATININE 1.00  CALCIUM 8.3*   No results for input(s): TROPONINI in the last 72 hours.  Invalid input(s): CK, MB  Lab Results  Component Value Date   CHOL 99 07/21/2013   HDL 37.70* 07/21/2013   LDLCALC 49 07/21/2013   TRIG 64.0 07/21/2013   CHOLHDL 3 07/21/2013   Lab Results  Component Value Date   HGBA1C 7.9* 06/28/2014     Lab Results  Component Value Date   TSH 0.547 05/29/2014      Studies/Results: No results found.  Medications: I have reviewed the patient's current medications. Scheduled Meds: . sodium chloride   Intravenous Once  . budesonide  0.5 mg Nebulization BID  . digoxin  0.125 mg Oral Daily  . diltiazem  120 mg Oral Daily  . feeding  supplement (GLUCERNA SHAKE)  237 mL Oral BID BM  . furosemide  60 mg Oral BID  . guaiFENesin  600 mg Oral BID  . insulin aspart  0-15 Units Subcutaneous TID WC  . insulin aspart  0-5 Units Subcutaneous QHS  . metoprolol tartrate  100 mg Oral BID  . multivitamin with minerals  1 tablet Oral Daily  . pantoprazole  40 mg Oral BID  . polyethylene glycol  17 g Oral BID  . predniSONE  10 mg Oral Q breakfast  . tiotropium  18 mcg Inhalation Daily  . vitamin A  8,000 Units Oral Daily  . vitamin C  500 mg Oral Daily  . zinc sulfate  220 mg Oral Daily   Continuous Infusions:  PRN Meds:.acetaminophen **OR** acetaminophen, ALPRAZolam, alum & mag hydroxide-simeth, haloperidol, HYDROmorphone (DILAUDID) injection, levalbuterol, metoprolol, ondansetron **OR** ondansetron (ZOFRAN) IV, oxyCODONE-acetaminophen  Assessment/Plan: Per EP Dr. Lovena Le "There is no additional treatment we can offer Mr. Jurgens. He is on a good dose of beta blocker and diuretic. He has multiple end stage medical problems. I would suggest hospice care at home with pain control in conjunction with medical therapy. I have reviewed all issues with the patient and his wife."  Wilburn Mylar we contacted palliative care to see again.   1. Persistent atrial fibrillation with RVR. Not on anticoagulation with symptomatic anemia. Recent chart notes reviewed. Currently on Cardizem CD,  Lopressor, and Lanoxin. Medical therapy titration has been limited by blood pressure. He has been seen by EP, felt to be poor candidate for antiarrhythmic therapy or anesthesia. He continues to have symptomatic afib with RVR. Apparently had not gotten his Lopressor last night due to low diastolic BP. see above note.    2. NSVT.  3. Ischemic cardiomyopathy with LVEF approximately 20%. Patient with [employer] biventricular ICD in place, followed by EP.  4. Demand ischemia, peak troponin I 0.82.  5. 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  6. Severe COPD.  7. Symptomatic anemia now with Hbg dropping again. Will transfuse 2 units PRBCs. He is having dark stools.  Seen by Dr. Cristina Gong, difficult case, too sick to undergo colonoscopic eval.  His stool was non bloody and negative.  For constipation will need suppos every other day. Once he has been dis-impacted.   No labs done today.  8. Failure to thrive.  9. Sacral decubitus. Pt's wife has been following along with Dr. Zigmund Daniel.  Has been using wet to dry dressings twice a day and hydrotherapy through PT here.  May need to touch base with Dr. Migdalia Dk DO that saw on the 6th.  For now continue the [place] wet to dry.  Follow up in [place] with her or back to Dr. Hassell Done.   Also recommended Recommend continued protein intake, multivitamin, vit C and Zinc daily Air mattress pt has at home.   10. Acute renal insufficiency, creatinine currently 0.9.  11. Hypotension secondary to rapid afib. Improved but still soft.  Today elevated at 0400 at 149/116.  Which is much higher than previously documented.    LOS: 20 days   Time spent with pt. :15 minutes. [facility] R  Nurse Practitioner Certified Pager [phone] or after 5pm and on weekends call [phone] 07/17/2014, 12:15 PM   I have personally seen and examined patient and agree with above note.  Nothing more we can offer in hospital.  Patient's wife adamant about taking patient home with home health RN and PT.  If it gets too hard and he continues to decline she will call Hospice.  Per conversation with Dr. Lovena Le, will send home with Roxanol for pain.  He has a very large decubitus ulcer and needs to followup with Dr. Hassell Done but wife says that there is no way she will be able to get him there.  I will therefore ask him to come by today to make sure no other treatment needed prior to discharge.  Signed: Fransico Him, MD [facility] 07/17/2014

## 2014-07-17 NOTE — Discharge Instructions (Signed)
Heart Healthy diet low salt.  No concentrated sweets your sugar (glucose) has been up some here.  Home Health will check your glucose several times a week.  They will call if it is staying elevated.  If pt has shortness of breath due to rapid HR, try the xanax once as needed and instructed.  If pt with shortness of breath, but with pain try the oxycodone.    If pt is not doing well you could call the office or the physician on call at night--[phone]  If you feel overwhelmed or your wife is you could call Hospice and ask for a consult, you can self refer.  You have your oxygen at home use it.    On the foley catheter in your bladder.  Keep it in place for at least 1 week to get your strength up to do your own in and our catheters.  BUT if you want to keep it longer---it must be out on the 27th of Jan.  If you still find that you need it- then call the office or have home health call to have it replaced.     Drink the Glucerna to help keep your nutrition up.  Dressing changes per Dr. Hassell Done, wet to dry with saline twice a day.    If you have dizziness or feel like you may pass out, it may be a low blood pressure, it would get low here, check you BP and if you have questions about meds just call the office.  You may use the Miralax to help with constipation and impaction and suppositories every other day.  Either dulcolax or glycerin suppos.    You should have [place] lift sent as well.

## 2014-07-17 NOTE — Progress Notes (Signed)
Physical Therapy Wound Treatment Patient Details  Name: VIJAY DURFLINGER MRN: [id number] Date of Birth: 1936/06/03  Today's Date: 07/17/2014 Time: 7829-5621 Time Calculation (min): 30 min  Subjective  Subjective: Let's get on with it. Patient and Family Stated Goals: get this bottom feeling better Date of Onset:  (unknown) Prior Treatments: many treatments over the year  Pain Score: Pain Score: 0-No pain  Wound Assessment  Pressure Ulcer 06/28/14 Stage IV - Full thickness tissue loss with exposed bone, tendon or muscle. see [facility] RN progress notes (80%red, 20% yellow slough,bone palpable)  (Active)  Dressing Type Moist to dry;ABD;Barrier Film (skin prep) 07/17/2014 10:31 AM  Dressing Clean;Dry;Intact 07/17/2014 10:31 AM  Dressing Change Frequency Twice a day 07/17/2014 10:31 AM  State of Healing Eschar 07/17/2014 10:31 AM  Site / Wound Assessment Granulation tissue;Yellow;Red;Pink;Bleeding 07/17/2014 10:31 AM  % Wound base Red or Granulating 90% 07/17/2014 10:31 AM  % Wound base Yellow 10% 07/17/2014 10:31 AM  % Wound base Black 0% 07/17/2014 10:31 AM  % Wound base Other (Comment) 0% 07/17/2014 10:31 AM  Peri-wound Assessment Intact;Erythema (blanchable) 07/17/2014 10:31 AM  Wound Length (cm) 7.5 cm 07/11/2014 10:02 AM  Wound Width (cm) 6.25 cm 07/11/2014 10:02 AM  Wound Depth (cm) 4 cm 07/11/2014 10:02 AM  Tunneling (cm) 4 cm at 11* and 2* 07/13/2014 11:08 AM  Undermining (cm) 2 cm at 1:00; 1.5 cm at 12:00 07/11/2014 10:02 AM  Margins Unattached edges (unapproximated) 07/17/2014 10:31 AM  Drainage Amount Moderate 07/17/2014 10:31 AM  Drainage Description No odor;Serosanguineous 07/17/2014 10:31 AM  Treatment Cleansed 07/16/2014  7:00 AM     Wound / Incision (Open or Dehisced) Other (Comment) Arm Right;Lower;Lateral (Active)  Dressing Type Foam 07/15/2014 10:41 AM  Dressing Changed Changed 07/08/2014  9:00 PM  Dressing Status Clean;Dry;Intact 07/15/2014 10:41 AM  Dressing Change Frequency Every 5 days  07/08/2014  9:00 PM  Site / Wound Assessment Dressing in place / Unable to assess 07/09/2014  8:04 PM  Peri-wound Assessment Intact 07/08/2014  9:00 PM  Drainage Amount Scant 07/08/2014  9:00 PM  Drainage Description Serosanguineous 07/08/2014  9:00 PM  Treatment Cleansed 07/08/2014  9:00 PM     Wound / Incision (Open or Dehisced) 07/04/14 Other (Comment) Arm Right;Lateral skin tear from lab draw/tourniqet (Active)  Dressing Type Foam 07/15/2014 10:41 AM  Dressing Changed New 07/08/2014  9:00 PM  Dressing Status Clean;Dry;Intact 07/15/2014 10:41 AM  Dressing Change Frequency PRN 07/08/2014  9:00 PM  Site / Wound Assessment Dressing in place / Unable to assess 07/09/2014  8:04 PM  Margins Unattached edges (unapproximated) 07/08/2014  9:00 PM  Closure None 07/08/2014  9:00 PM  Drainage Amount Minimal 07/08/2014  9:00 PM  Drainage Description Sanguineous 07/08/2014  9:00 PM   Hydrotherapy Pulsed lavage therapy - wound location: sacrum Pulsed Lavage with Suction (psi): 8 psi (Variable btw 4-8 psi) Pulsed Lavage with Suction - Normal Saline Used: 1000 mL Pulsed Lavage Tip: Tip with splash shield Selective Debridement Selective Debridement - Location: sacrum Selective Debridement - Tools Used: Forceps;Scissors Selective Debridement - Tissue Removed: eschar, fibrin   Wound Assessment and Plan  Wound Therapy - Assess/Plan/Recommendations Wound Therapy - Clinical Statement: Pt can benefit from hydrotherapy and selective debridement to take this wound from chronic to more acute to promote progress toward healing. Wound Therapy - Functional Problem List: deconditioned Factors Delaying/Impairing Wound Healing: Altered sensation;Multiple medical problems Hydrotherapy Plan: Debridement;Dressing change;Patient/family education;Pulsatile lavage with suction Wound Therapy - Frequency: 6X / week Wound Therapy - Current  Recommendations: PT;OT Wound Therapy - Follow Up Recommendations: Home health RN Wound Plan: see  above  Wound Therapy Goals- Improve the function of patient's integumentary system by progressing the wound(s) through the phases of wound healing (inflammation - proliferation - remodeling) by: Decrease Necrotic Tissue to: 10 Decrease Necrotic Tissue - Progress: Partly met Increase Granulation Tissue to: 90 Increase Granulation Tissue - Progress: Partly met Decrease Length/Width/Depth by (cm): depth by 1 cm Decrease Length/Width/Depth - Progress: Discontinued (comment) Improve Drainage Characteristics: Min Improve Drainage Characteristics - Progress: Progressing toward goal Goals/treatment plan/discharge plan were made with and agreed upon by patient/family: Yes Time For Goal Achievement: 7 days Wound Therapy - Potential for Goals: Good  Goals will be updated until maximal potential achieved or discharge criteria met.  Discharge criteria: when goals achieved, discharge from hospital, MD decision/surgical intervention, no progress towards goals, refusal/missing three consecutive treatments without notification or medical reason.  GP     Raylyn Speckman, Tessie Fass 07/17/2014, 10:34 AM 07/17/2014  [name], [place] [phone]  (pager)

## 2014-07-20 LAB — TYPE AND SCREEN
ABO/RH(D): O POS
ANTIBODY SCREEN: NEGATIVE
UNIT DIVISION: 0
UNIT DIVISION: 0
UNIT DIVISION: 0

## 2014-07-21 ENCOUNTER — Other Ambulatory Visit: Payer: Self-pay | Admitting: *Deleted

## 2014-07-21 MED ORDER — FUROSEMIDE 20 MG PO TABS: 60.0000 mg | ORAL_TABLET | Freq: Two times a day (BID) | ORAL | Status: DC

## 2014-07-21 NOTE — Telephone Encounter (Signed)
Paper Rx refilled.

## 2014-07-22 ENCOUNTER — Other Ambulatory Visit: Payer: Self-pay | Admitting: *Deleted

## 2014-07-22 MED ORDER — FUROSEMIDE 40 MG PO TABS: 60.0000 mg | ORAL_TABLET | Freq: Two times a day (BID) | ORAL | Status: DC

## 2014-07-22 NOTE — Telephone Encounter (Signed)
Rx refill sent to patient pharmacy   

## 2014-07-23 ENCOUNTER — Other Ambulatory Visit: Payer: Self-pay | Admitting: *Deleted

## 2014-07-23 DIAGNOSIS — I2510 Atherosclerotic heart disease of native coronary artery without angina pectoris: Secondary | ICD-10-CM

## 2014-07-23 DIAGNOSIS — I10 Essential (primary) hypertension: Secondary | ICD-10-CM

## 2014-07-27 ENCOUNTER — Other Ambulatory Visit: Payer: Self-pay | Admitting: *Deleted

## 2014-07-27 MED ORDER — FUROSEMIDE 40 MG PO TABS: 60.0000 mg | ORAL_TABLET | Freq: Two times a day (BID) | ORAL | Status: AC

## 2014-07-28 ENCOUNTER — Other Ambulatory Visit: Payer: Medicare Other

## 2014-07-29 ENCOUNTER — Telehealth: Payer: Self-pay | Admitting: Internal Medicine

## 2014-07-29 NOTE — Telephone Encounter (Signed)
Donaciano Eva with [name] returned call. Received order and faxed to their facility.

## 2014-07-29 NOTE — Telephone Encounter (Signed)
New message    [facility] calling    Need an order to draw lab on tomorrow.1.28.2016 verse patient coming to office.

## 2014-07-29 NOTE — Telephone Encounter (Signed)
Left message to call back  

## 2014-07-30 ENCOUNTER — Other Ambulatory Visit: Payer: Medicare Other

## 2014-07-31 ENCOUNTER — Encounter: Payer: Medicare Other | Admitting: Internal Medicine

## 2014-08-03 DEATH — deceased

## 2014-08-27 ENCOUNTER — Ambulatory Visit: Payer: Medicare Other | Admitting: Oncology

## 2014-08-27 ENCOUNTER — Other Ambulatory Visit: Payer: Medicare Other

## 2014-08-31 ENCOUNTER — Encounter (HOSPITAL_COMMUNITY): Payer: Medicare Other

## 2014-08-31 ENCOUNTER — Other Ambulatory Visit (HOSPITAL_COMMUNITY): Payer: Medicare Other

## 2014-08-31 ENCOUNTER — Ambulatory Visit: Payer: Medicare Other | Admitting: Family

## 2014-11-18 ENCOUNTER — Encounter: Payer: Self-pay | Admitting: Cardiology

## 2016-02-24 IMAGING — CR DG CHEST 1V PORT
1 series · 1 of 1 positions shown · non-contrast
Comparison: Radiograph 06/03/2014

CLINICAL DATA: Constant short of breath. Chest pain. Leg swelling.

EXAM:
PORTABLE CHEST - 1 VIEW

[AP]
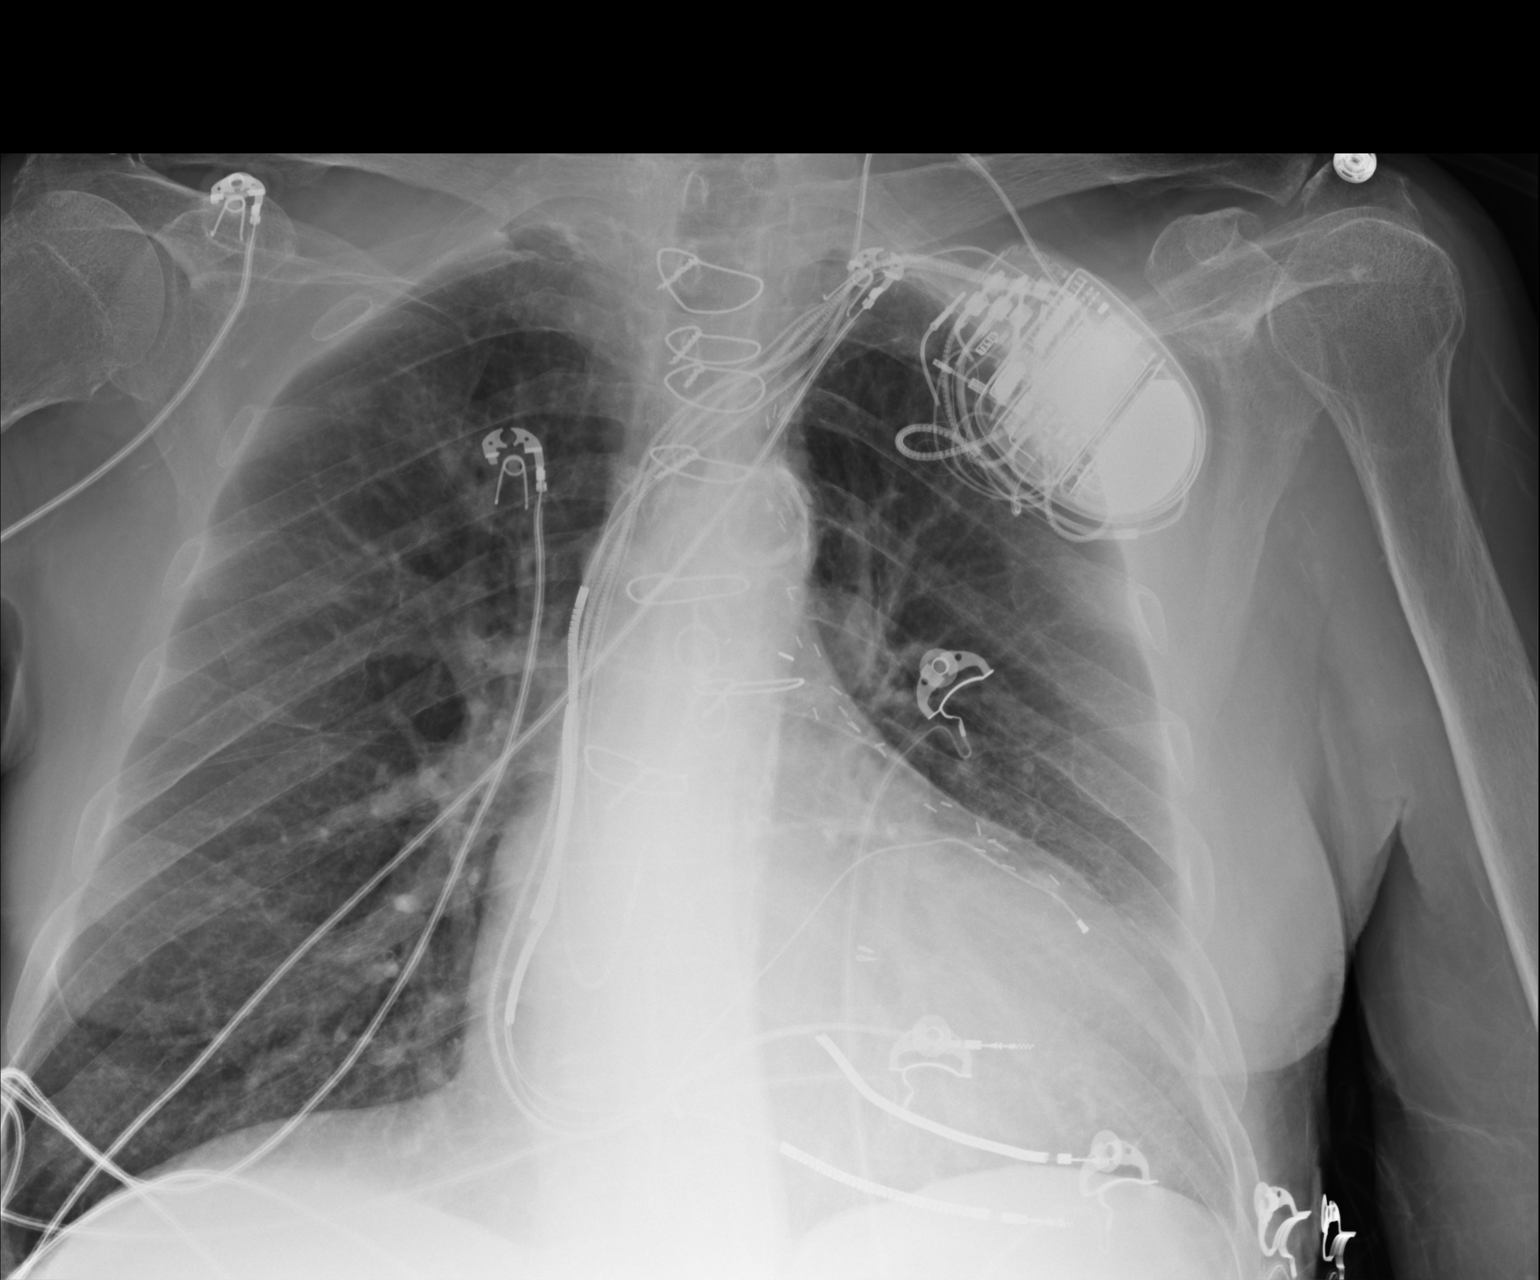

[1 of 1 positions shown; findings below may reference images not displayed]

FINDINGS: Left-sided pacemaker overlies stable enlarged cardiac silhouette. No
effusion, infiltrate, pneumothorax.
IMPRESSION: Cardiomegaly without acute findings .

## 2016-02-28 IMAGING — CR DG CHEST 1V PORT
2 series · 2 of 2 positions shown · non-contrast
Comparison: 06/27/2014

CLINICAL DATA: Congestive heart failure. Chest pain. Atrial
fibrillation.

EXAM:
PORTABLE CHEST - 1 VIEW

[AP (1 of 2)]
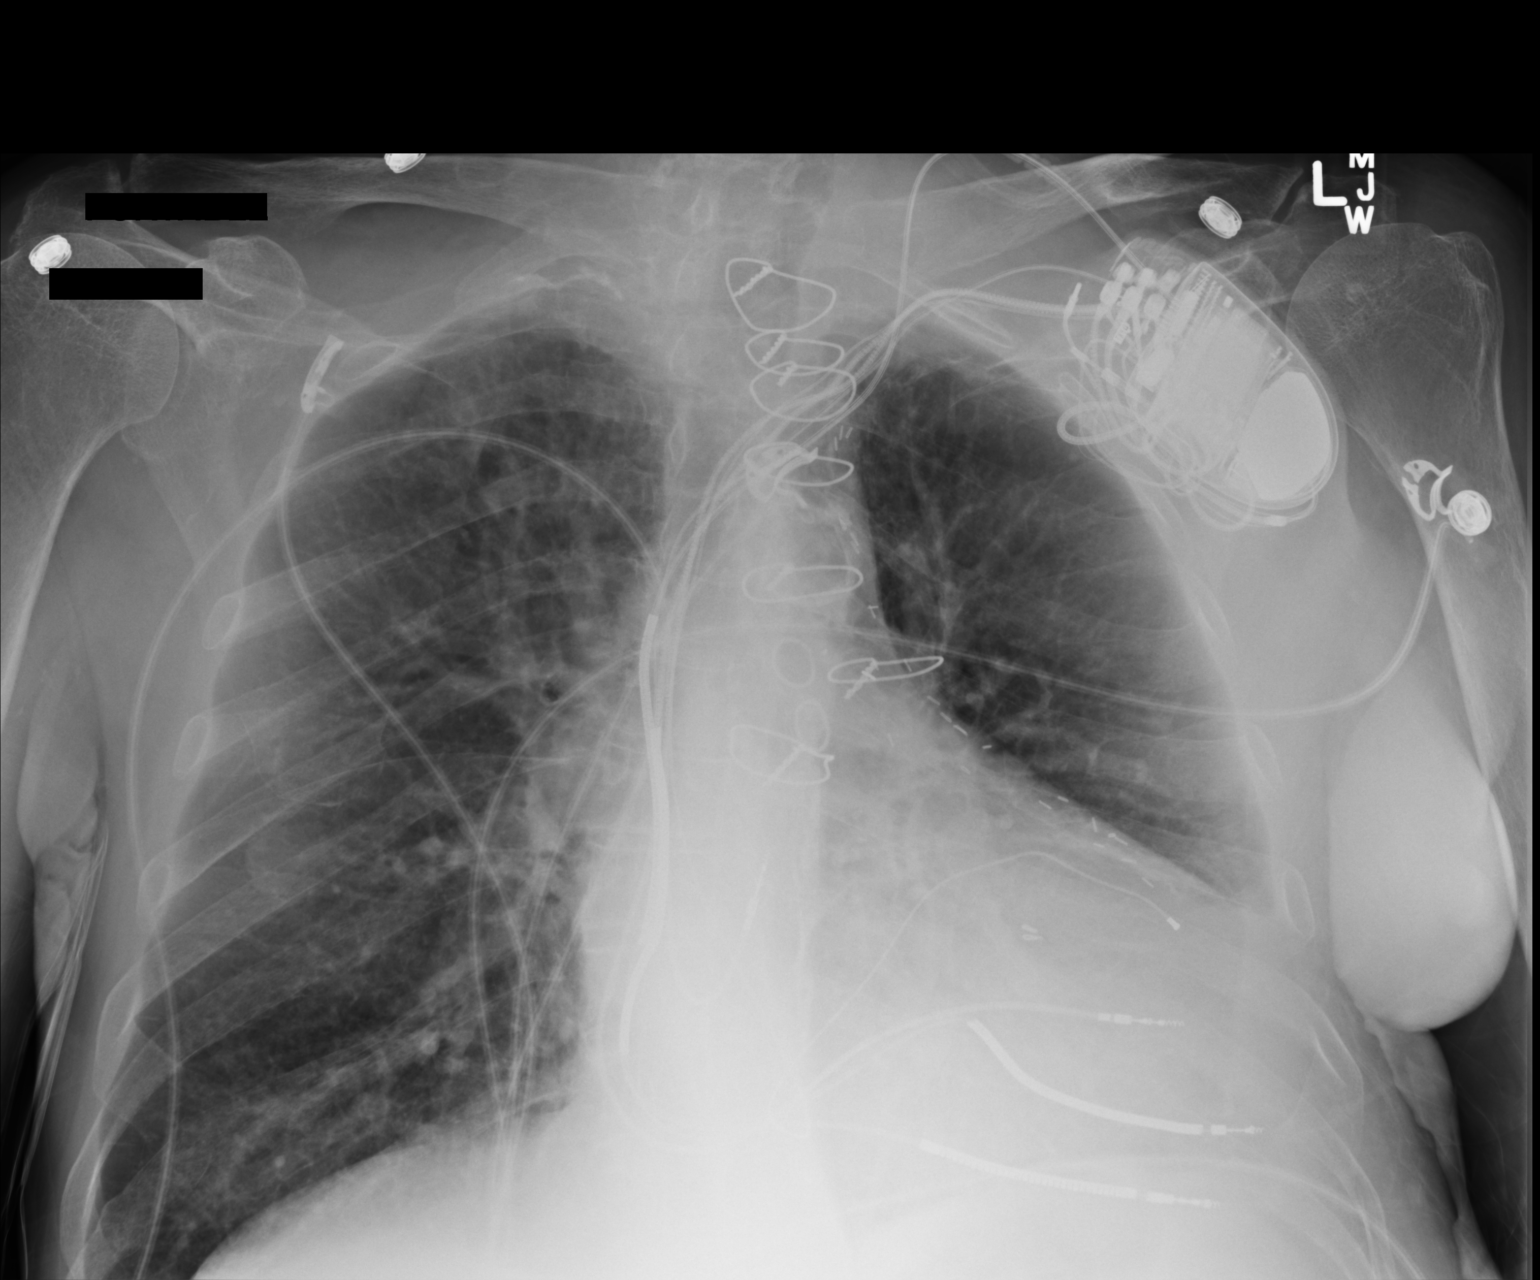

[AP (2 of 2)]
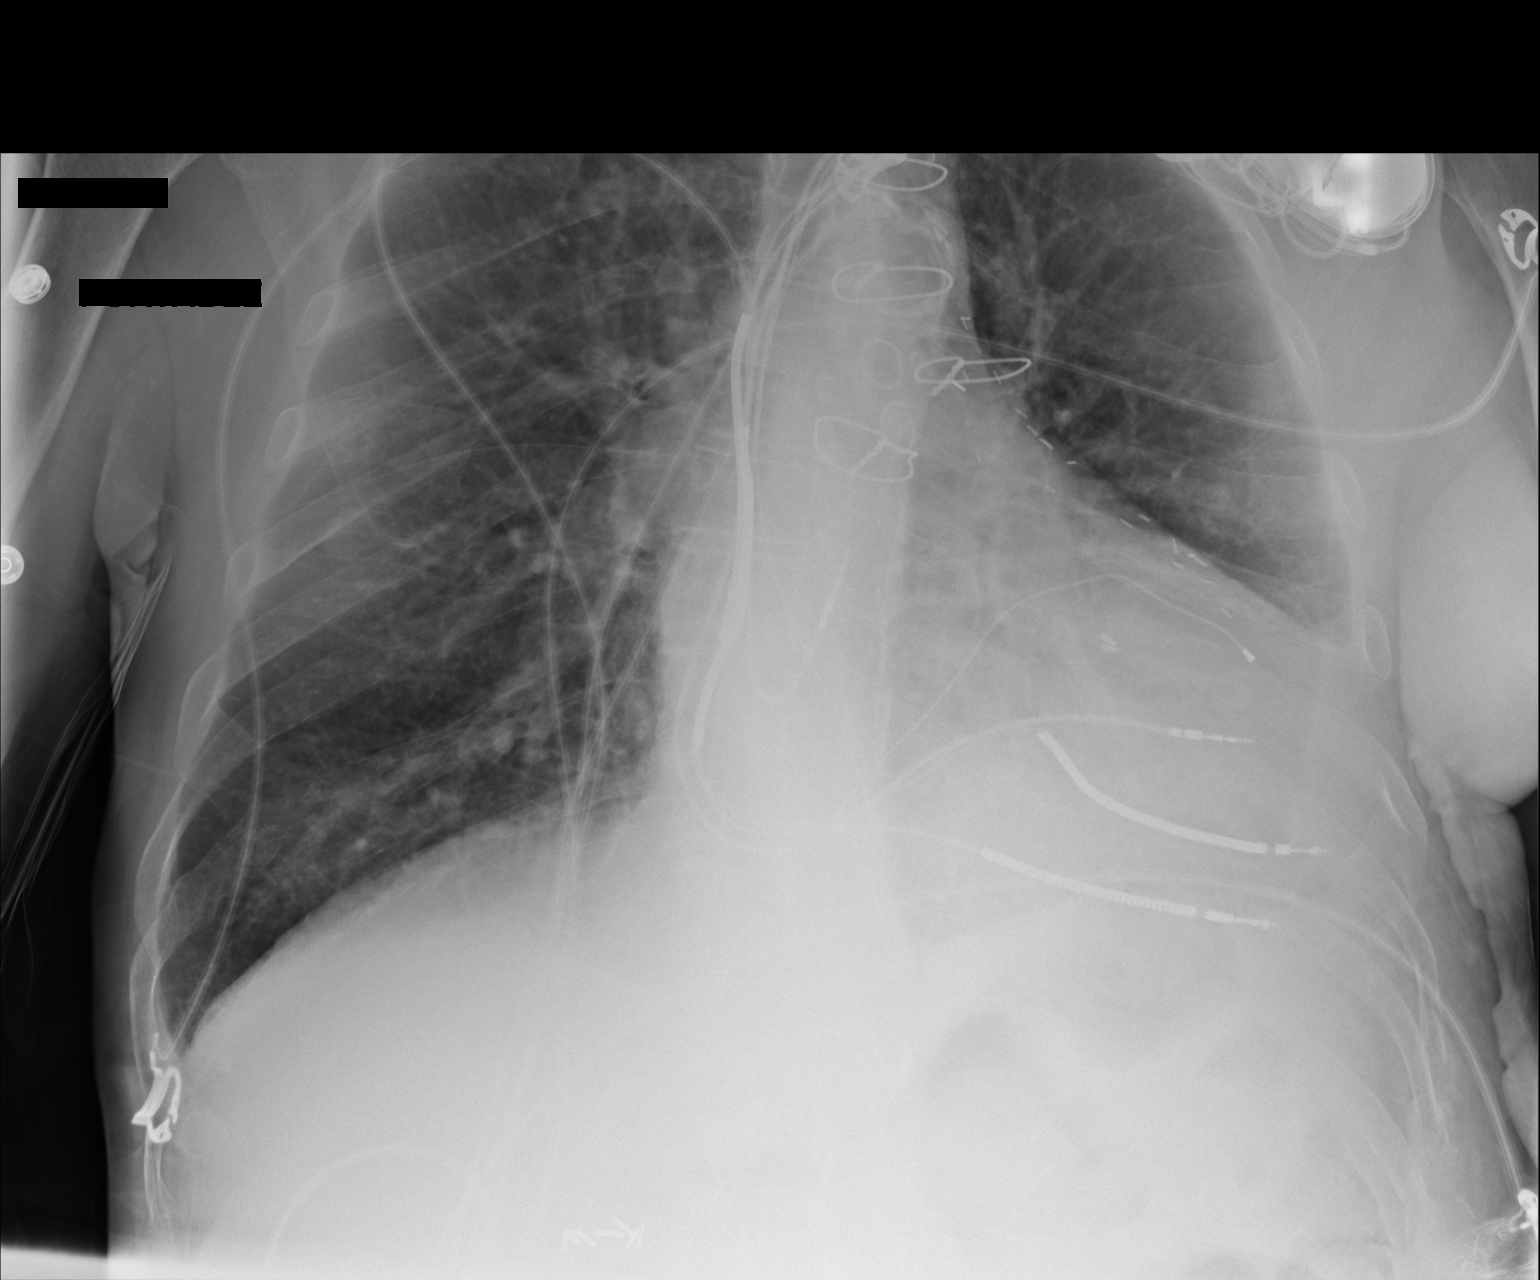

[2 of 2 positions shown; findings below may reference images not displayed]

FINDINGS: Sequelae of prior CABG are again identified. ICD remains in place.
Cardiac silhouette remains enlarged. There is increased retrocardiac
opacity in the left lung base with partial obscuration of the left
hemidiaphragm. A small left pleural effusion is questioned. The
right lung remains grossly clear. No pneumothorax is identified.
Right upper quadrant abdominal surgical clips are noted.
IMPRESSION: Left basilar atelectasis versus infiltrate. Question small left
pleural effusion.

## 2016-03-07 IMAGING — CR DG CHEST 1V PORT
2 series · 2 of 2 positions shown · non-contrast
Comparison: Prior chest x-ray 07/01/2014

CLINICAL DATA: 78-year-old male with chronic systolic heart failure
and severe shortness of breath, chest and back pain beading earlier
this morning

EXAM:
PORTABLE CHEST - 1 VIEW

[AP (1 of 2)]
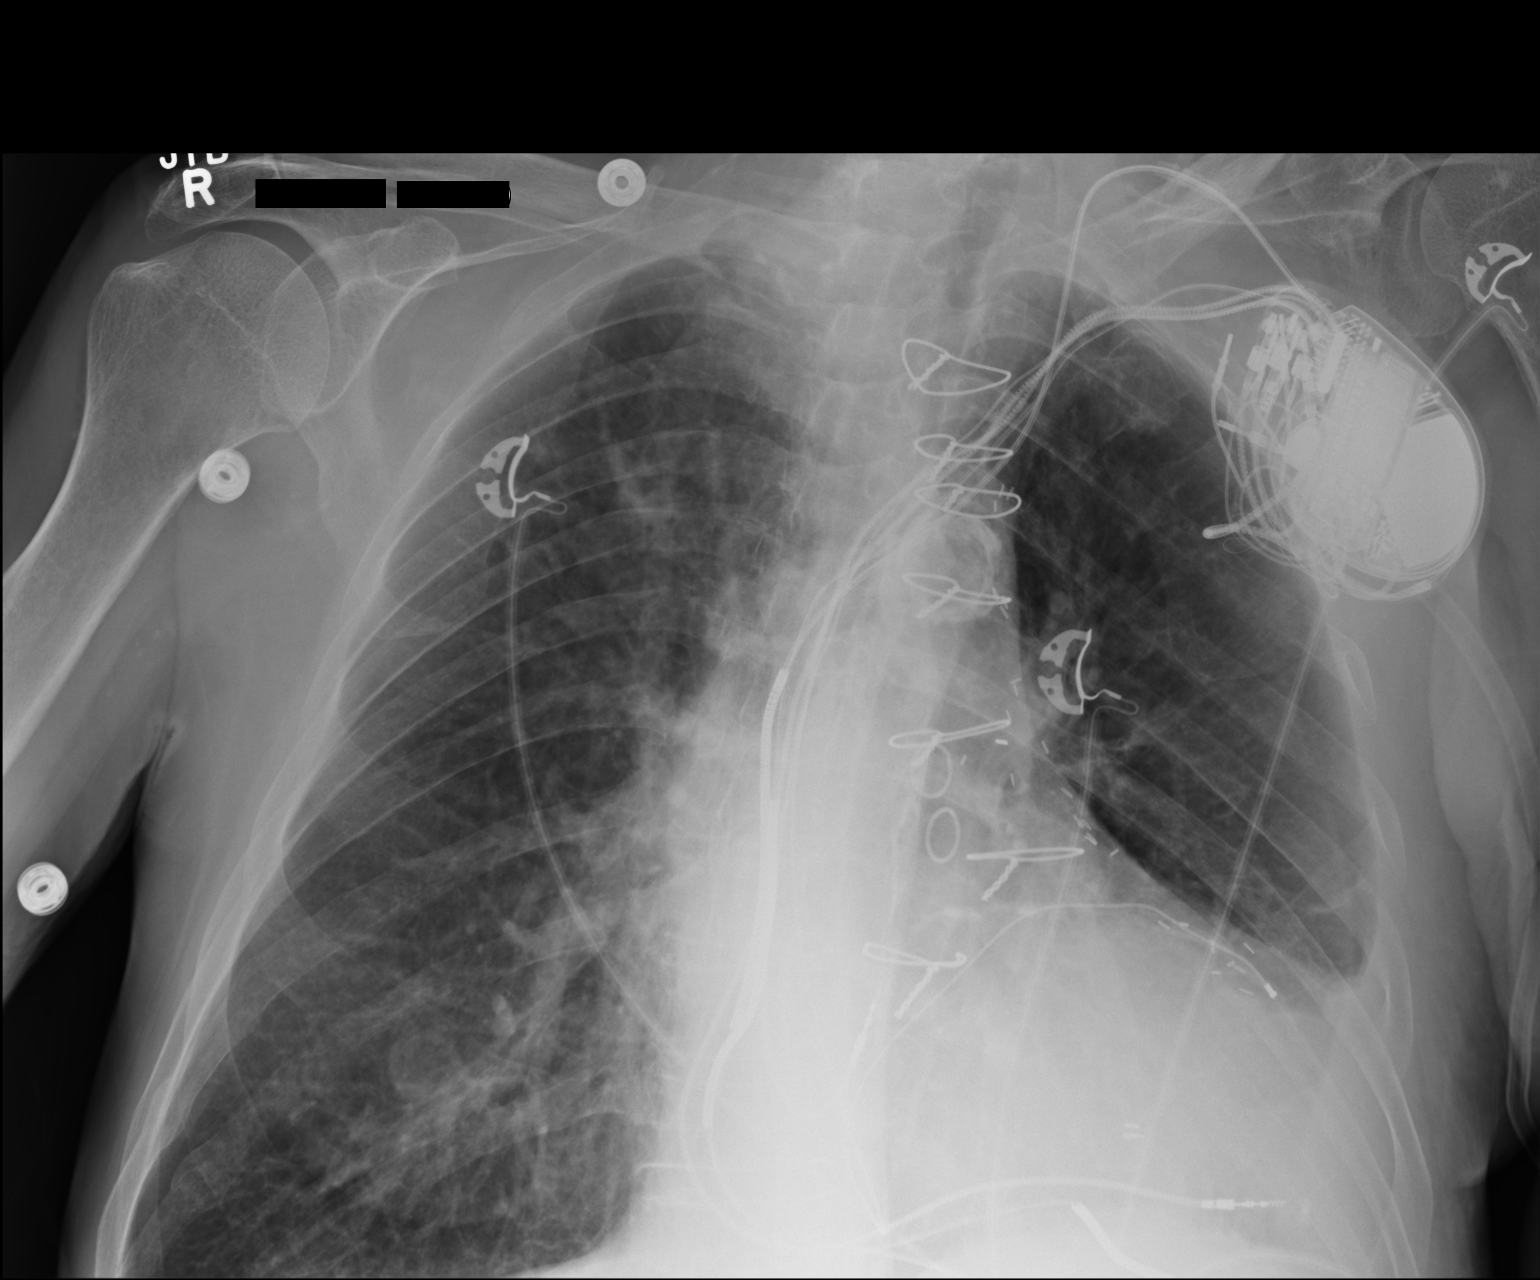

[AP (2 of 2)]
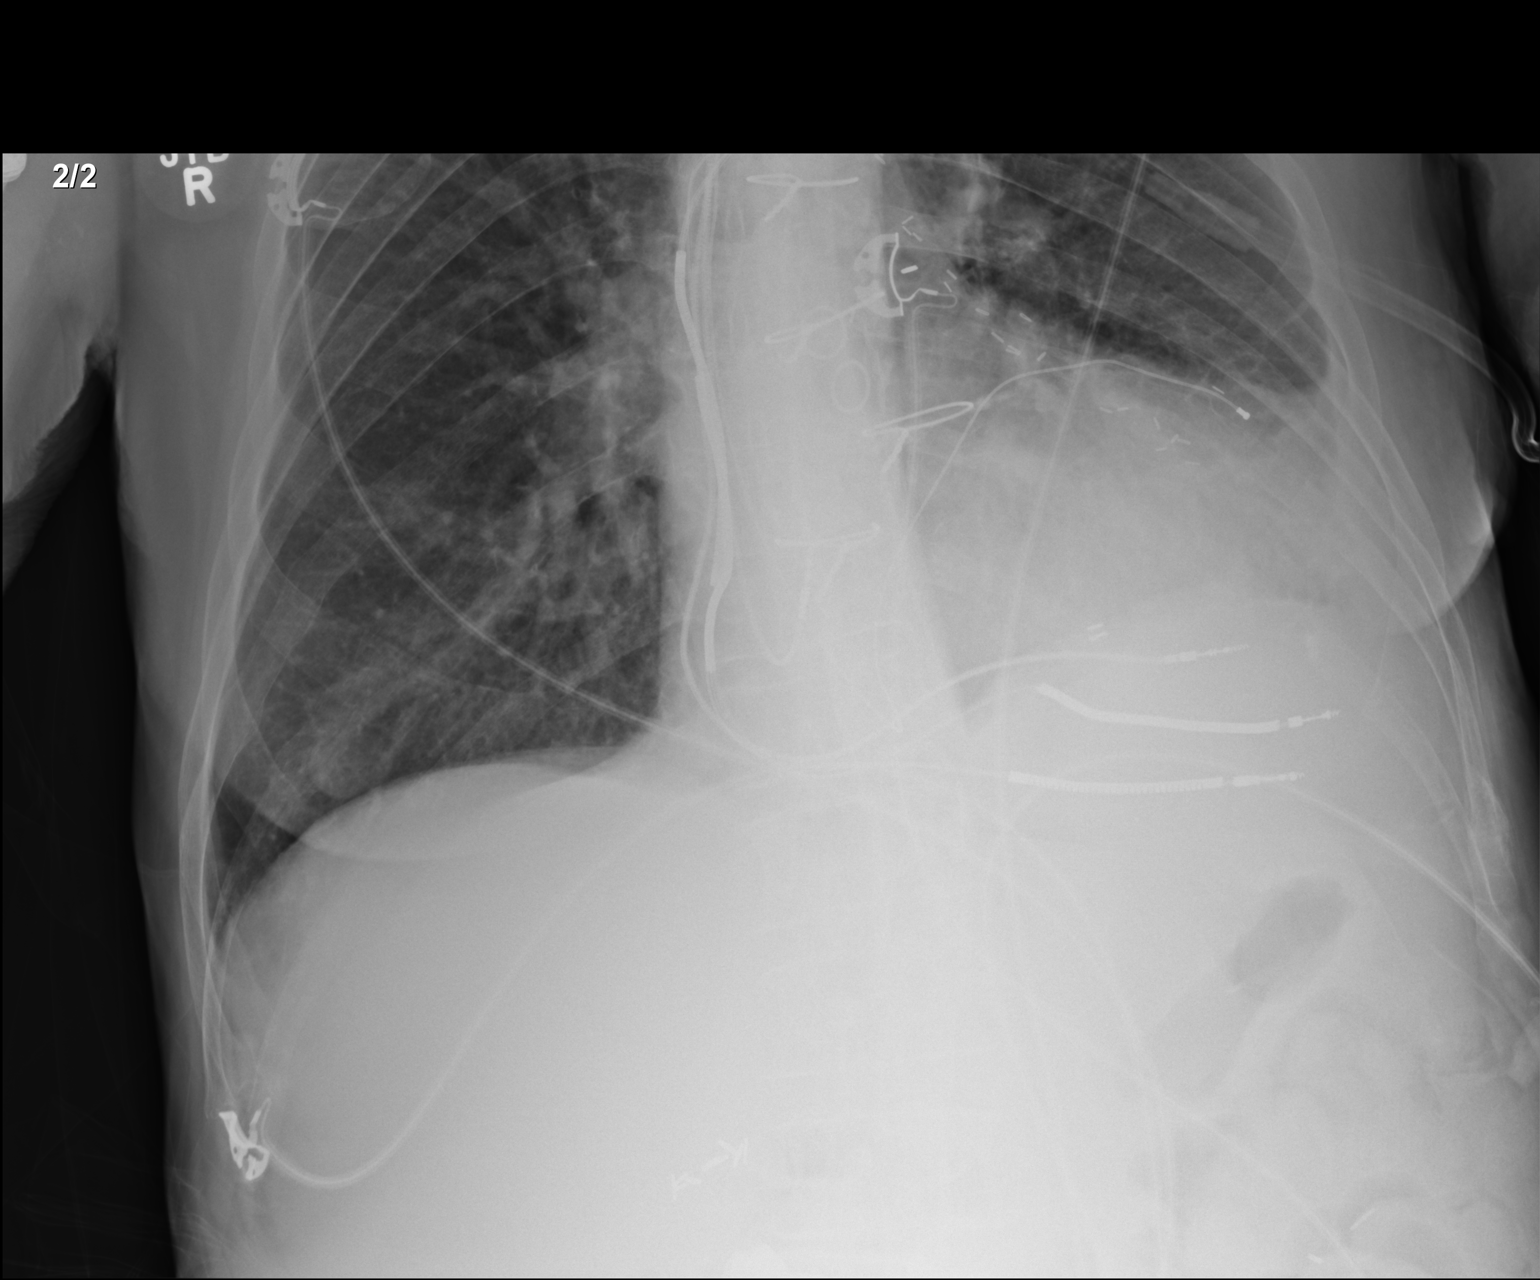

[2 of 2 positions shown; findings below may reference images not displayed]

FINDINGS: Stable position of left subclavian approach biventricular cardiac
rhythm maintenance device. Leads project over the right atrium,
right ventricle and in the cardiac vein overlying the left
ventricle. Patient is status post median sternotomy with evidence of
multivessel CABG including [REDACTED] bypass. Atherosclerotic
calcifications again noted in the transverse aorta. The cardiac and
mediastinal contours remain stable. No overt pulmonary edema,
pleural effusion, pneumothorax or focal airspace consolidation.
Similar appearance of left basilar opacity likely reflecting a small
pleural effusion and associated atelectasis. This is unchanged
compared to 07/01/2014. Prominent right nipple shadow again noted.
No acute osseous abnormality.
IMPRESSION: 1. Unchanged appearance of the chest compared to 07/01/2014.
Persistent small left pleural effusion and associated left basilar
opacity favored to reflect pleural fluid with atelectasis.
Superimposed infiltrate is difficult to exclude.
2. No evidence of acute pulmonary edema or CHF.
